# Patient Record
Sex: Female | Born: 1970
Health system: Southern US, Community
[De-identification: ages and names within clinical notes are randomized; demographics above are authoritative.]

## PROBLEM LIST (undated history)

## (undated) ENCOUNTER — Ambulatory Visit: Payer: 59 | Source: Home / Self Care

## (undated) DIAGNOSIS — Z8673 Personal history of transient ischemic attack (TIA), and cerebral infarction without residual deficits: Secondary | ICD-10-CM

## (undated) DIAGNOSIS — K219 Gastro-esophageal reflux disease without esophagitis: Secondary | ICD-10-CM

## (undated) DIAGNOSIS — I10 Essential (primary) hypertension: Secondary | ICD-10-CM

## (undated) DIAGNOSIS — J189 Pneumonia, unspecified organism: Secondary | ICD-10-CM

## (undated) DIAGNOSIS — D649 Anemia, unspecified: Secondary | ICD-10-CM

## (undated) DIAGNOSIS — M1990 Unspecified osteoarthritis, unspecified site: Secondary | ICD-10-CM

## (undated) DIAGNOSIS — R200 Anesthesia of skin: Secondary | ICD-10-CM

## (undated) DIAGNOSIS — R51 Headache: Secondary | ICD-10-CM

## (undated) DIAGNOSIS — J449 Chronic obstructive pulmonary disease, unspecified: Secondary | ICD-10-CM

## (undated) DIAGNOSIS — F419 Anxiety disorder, unspecified: Secondary | ICD-10-CM

## (undated) DIAGNOSIS — Z9289 Personal history of other medical treatment: Secondary | ICD-10-CM

## (undated) DIAGNOSIS — I639 Cerebral infarction, unspecified: Secondary | ICD-10-CM

## (undated) HISTORY — PX: TUBAL LIGATION: SHX77

## (undated) HISTORY — DX: Gastro-esophageal reflux disease without esophagitis: K21.9

## (undated) HISTORY — PX: CHOLECYSTECTOMY: SHX55

---

## 2001-05-21 ENCOUNTER — Emergency Department (HOSPITAL_COMMUNITY): Admission: EM | Admit: 2001-05-21 | Discharge: 2001-05-21 | Payer: Self-pay

## 2001-06-01 ENCOUNTER — Emergency Department (HOSPITAL_COMMUNITY): Admission: EM | Admit: 2001-06-01 | Discharge: 2001-06-01 | Payer: Self-pay | Admitting: *Deleted

## 2001-07-13 ENCOUNTER — Emergency Department (HOSPITAL_COMMUNITY): Admission: EM | Admit: 2001-07-13 | Discharge: 2001-07-13 | Payer: Self-pay | Admitting: Emergency Medicine

## 2002-02-14 ENCOUNTER — Emergency Department (HOSPITAL_COMMUNITY): Admission: EM | Admit: 2002-02-14 | Discharge: 2002-02-14 | Payer: Self-pay | Admitting: Emergency Medicine

## 2002-06-05 ENCOUNTER — Ambulatory Visit (HOSPITAL_COMMUNITY): Admission: RE | Admit: 2002-06-05 | Discharge: 2002-06-05 | Payer: Self-pay | Admitting: *Deleted

## 2002-09-14 ENCOUNTER — Ambulatory Visit (HOSPITAL_COMMUNITY): Admission: RE | Admit: 2002-09-14 | Discharge: 2002-09-14 | Payer: Self-pay | Admitting: *Deleted

## 2002-09-19 ENCOUNTER — Inpatient Hospital Stay (HOSPITAL_COMMUNITY): Admission: AD | Admit: 2002-09-19 | Discharge: 2002-09-19 | Payer: Self-pay | Admitting: *Deleted

## 2002-09-20 ENCOUNTER — Ambulatory Visit (HOSPITAL_COMMUNITY): Admission: RE | Admit: 2002-09-20 | Discharge: 2002-09-20 | Payer: Self-pay | Admitting: *Deleted

## 2002-10-13 ENCOUNTER — Inpatient Hospital Stay (HOSPITAL_COMMUNITY): Admission: AD | Admit: 2002-10-13 | Discharge: 2002-10-13 | Payer: Self-pay | Admitting: Obstetrics and Gynecology

## 2002-11-09 ENCOUNTER — Inpatient Hospital Stay (HOSPITAL_COMMUNITY): Admission: AD | Admit: 2002-11-09 | Discharge: 2002-11-12 | Payer: Self-pay | Admitting: Obstetrics and Gynecology

## 2003-01-28 ENCOUNTER — Emergency Department (HOSPITAL_COMMUNITY): Admission: EM | Admit: 2003-01-28 | Discharge: 2003-01-28 | Payer: Self-pay

## 2003-02-24 ENCOUNTER — Encounter: Payer: Self-pay | Admitting: Emergency Medicine

## 2003-02-24 ENCOUNTER — Emergency Department (HOSPITAL_COMMUNITY): Admission: EM | Admit: 2003-02-24 | Discharge: 2003-02-24 | Payer: Self-pay | Admitting: Emergency Medicine

## 2003-03-08 ENCOUNTER — Emergency Department (HOSPITAL_COMMUNITY): Admission: EM | Admit: 2003-03-08 | Discharge: 2003-03-08 | Payer: Self-pay | Admitting: Emergency Medicine

## 2003-04-04 ENCOUNTER — Encounter: Payer: Self-pay | Admitting: General Surgery

## 2003-04-04 ENCOUNTER — Observation Stay (HOSPITAL_COMMUNITY): Admission: RE | Admit: 2003-04-04 | Discharge: 2003-04-05 | Payer: Self-pay | Admitting: General Surgery

## 2003-04-04 ENCOUNTER — Encounter (INDEPENDENT_AMBULATORY_CARE_PROVIDER_SITE_OTHER): Payer: Self-pay | Admitting: Specialist

## 2004-07-02 ENCOUNTER — Emergency Department (HOSPITAL_COMMUNITY): Admission: EM | Admit: 2004-07-02 | Discharge: 2004-07-02 | Payer: Self-pay | Admitting: Emergency Medicine

## 2004-10-22 ENCOUNTER — Inpatient Hospital Stay (HOSPITAL_COMMUNITY): Admission: AD | Admit: 2004-10-22 | Discharge: 2004-10-22 | Payer: Self-pay | Admitting: Obstetrics

## 2005-03-07 ENCOUNTER — Inpatient Hospital Stay (HOSPITAL_COMMUNITY): Admission: AD | Admit: 2005-03-07 | Discharge: 2005-03-09 | Payer: Self-pay | Admitting: Obstetrics

## 2005-03-08 ENCOUNTER — Encounter (INDEPENDENT_AMBULATORY_CARE_PROVIDER_SITE_OTHER): Payer: Self-pay | Admitting: Specialist

## 2006-05-28 ENCOUNTER — Emergency Department (HOSPITAL_COMMUNITY): Admission: EM | Admit: 2006-05-28 | Discharge: 2006-05-28 | Payer: Self-pay | Admitting: Family Medicine

## 2006-10-09 ENCOUNTER — Emergency Department (HOSPITAL_COMMUNITY): Admission: EM | Admit: 2006-10-09 | Discharge: 2006-10-09 | Payer: Self-pay | Admitting: Family Medicine

## 2008-02-01 ENCOUNTER — Emergency Department (HOSPITAL_COMMUNITY): Admission: EM | Admit: 2008-02-01 | Discharge: 2008-02-01 | Payer: Self-pay | Admitting: Family Medicine

## 2008-04-05 ENCOUNTER — Emergency Department (HOSPITAL_COMMUNITY): Admission: EM | Admit: 2008-04-05 | Discharge: 2008-04-05 | Payer: Self-pay | Admitting: Emergency Medicine

## 2008-04-07 ENCOUNTER — Emergency Department (HOSPITAL_COMMUNITY): Admission: EM | Admit: 2008-04-07 | Discharge: 2008-04-08 | Payer: Self-pay | Admitting: Emergency Medicine

## 2008-07-23 ENCOUNTER — Emergency Department (HOSPITAL_COMMUNITY): Admission: EM | Admit: 2008-07-23 | Discharge: 2008-07-23 | Payer: Self-pay | Admitting: Emergency Medicine

## 2008-12-08 ENCOUNTER — Emergency Department (HOSPITAL_COMMUNITY): Admission: EM | Admit: 2008-12-08 | Discharge: 2008-12-08 | Payer: Self-pay | Admitting: Emergency Medicine

## 2009-07-14 ENCOUNTER — Emergency Department (HOSPITAL_COMMUNITY): Admission: EM | Admit: 2009-07-14 | Discharge: 2009-07-14 | Payer: Self-pay | Admitting: Chiropractic Medicine

## 2009-10-10 ENCOUNTER — Emergency Department (HOSPITAL_COMMUNITY): Admission: EM | Admit: 2009-10-10 | Discharge: 2009-10-10 | Payer: Self-pay | Admitting: Emergency Medicine

## 2009-11-14 ENCOUNTER — Emergency Department (HOSPITAL_COMMUNITY): Admission: EM | Admit: 2009-11-14 | Discharge: 2009-11-14 | Payer: Self-pay | Admitting: Family Medicine

## 2010-07-20 ENCOUNTER — Emergency Department (HOSPITAL_COMMUNITY): Admission: EM | Admit: 2010-07-20 | Discharge: 2010-07-20 | Payer: Self-pay | Admitting: Emergency Medicine

## 2010-07-29 ENCOUNTER — Other Ambulatory Visit: Admission: RE | Admit: 2010-07-29 | Discharge: 2010-07-29 | Payer: Self-pay | Admitting: Family Medicine

## 2010-10-26 ENCOUNTER — Emergency Department (HOSPITAL_COMMUNITY)
Admission: EM | Admit: 2010-10-26 | Discharge: 2010-10-26 | Payer: Self-pay | Source: Home / Self Care | Admitting: Emergency Medicine

## 2011-01-04 ENCOUNTER — Other Ambulatory Visit: Payer: Self-pay | Admitting: Neurological Surgery

## 2011-01-04 DIAGNOSIS — M542 Cervicalgia: Secondary | ICD-10-CM

## 2011-01-06 ENCOUNTER — Ambulatory Visit
Admission: RE | Admit: 2011-01-06 | Discharge: 2011-01-06 | Disposition: A | Discharge status: Home / Self Care | Payer: 59 | Source: Ambulatory Visit | Attending: Neurological Surgery | Admitting: Neurological Surgery

## 2011-01-06 DIAGNOSIS — M542 Cervicalgia: Secondary | ICD-10-CM

## 2011-01-23 LAB — WET PREP, GENITAL
WBC, Wet Prep HPF POC: NONE SEEN
Yeast Wet Prep HPF POC: NONE SEEN

## 2011-02-12 LAB — URINE CULTURE
Colony Count: NO GROWTH
Culture: NO GROWTH

## 2011-02-12 LAB — URINE MICROSCOPIC-ADD ON

## 2011-02-12 LAB — DIFFERENTIAL
Basophils Absolute: 0 10*3/uL (ref 0.0–0.1)
Basophils Relative: 1 % (ref 0–1)
Eosinophils Absolute: 0.2 10*3/uL (ref 0.0–0.7)
Eosinophils Relative: 3 % (ref 0–5)
Lymphocytes Relative: 61 % — ABNORMAL HIGH (ref 12–46)
Lymphs Abs: 3.5 10*3/uL (ref 0.7–4.0)
Monocytes Absolute: 0.4 10*3/uL (ref 0.1–1.0)
Monocytes Relative: 7 % (ref 3–12)
Neutro Abs: 1.7 10*3/uL (ref 1.7–7.7)
Neutrophils Relative %: 29 % — ABNORMAL LOW (ref 43–77)

## 2011-02-12 LAB — BASIC METABOLIC PANEL
BUN: 10 mg/dL (ref 6–23)
CO2: 25 mEq/L (ref 19–32)
Calcium: 8.9 mg/dL (ref 8.4–10.5)
Chloride: 106 mEq/L (ref 96–112)
Creatinine, Ser: 0.81 mg/dL (ref 0.4–1.2)
GFR calc Af Amer: >60 mL/min (ref >60)
GFR calc non Af Amer: >60 mL/min (ref >60)
Glucose, Bld: 104 mg/dL — ABNORMAL HIGH (ref 70–99)
Potassium: 3.2 mEq/L — ABNORMAL LOW (ref 3.5–5.1)
Sodium: 140 mEq/L (ref 135–145)

## 2011-02-12 LAB — URINALYSIS, ROUTINE W REFLEX MICROSCOPIC
Bilirubin Urine: NEGATIVE
Glucose, UA: NEGATIVE mg/dL
Ketones, ur: NEGATIVE mg/dL
Nitrite: NEGATIVE
Protein, ur: NEGATIVE mg/dL
Specific Gravity, Urine: 1.026 (ref 1.005–1.030)
Urobilinogen, UA: 0.2 mg/dL (ref 0.0–1.0)
pH: 5.5 (ref 5.0–8.0)

## 2011-02-12 LAB — CSF CELL COUNT WITH DIFFERENTIAL
Tube #: 1
Tube #: 4
WBC, CSF: 4 /mm3 (ref 0–5)

## 2011-02-12 LAB — CBC
HCT: 36.6 % (ref 36.0–46.0)
Hemoglobin: 12.1 g/dL (ref 12.0–15.0)
MCHC: 33 g/dL (ref 30.0–36.0)
MCV: 82.6 fL (ref 78.0–100.0)
Platelets: 248 10*3/uL (ref 150–400)
RBC: 4.43 MIL/uL (ref 3.87–5.11)
RDW: 16.6 % — ABNORMAL HIGH (ref 11.5–15.5)
WBC: 5.8 10*3/uL (ref 4.0–10.5)

## 2011-02-12 LAB — CSF CULTURE W GRAM STAIN: Culture: NO GROWTH

## 2011-02-12 LAB — PROTEIN AND GLUCOSE, CSF: Total  Protein, CSF: 38 mg/dL (ref 15–45)

## 2011-03-26 NOTE — Op Note (Signed)
[name], [name]                             ACCOUNT NO.:  [id number]   MEDICAL RECORD NO.:  [id number]                   PATIENT TYPE:  AMB   LOCATION:  DAY                                  FACILITY:  [facility]   PHYSICIAN:  Adolph Pollack, M.D.            DATE OF BIRTH:  September 01, 1971   DATE OF PROCEDURE:  04/04/2003  DATE OF DISCHARGE:                                 OPERATIVE REPORT   PREOPERATIVE DIAGNOSIS:  Symptomatic cholelithiasis.   POSTOPERATIVE DIAGNOSIS:  Chronic cholecystitis and cholelithiasis.   PROCEDURE:  Laparoscopic cholecystectomy with intraoperative cholangiogram.   SURGEON:  Adolph Pollack, M.D.   ASSISTANT:  Anselm Pancoast. Zachery Dakins, M.D.   ANESTHESIA:  General.   INDICATIONS FOR PROCEDURE:  [name] is a 40 year old female whose been  having attacks of biliary colic and has been seen in the emergency  department 2-3 times now. She is noted to have gallstones and had a mild  elevation in liver function tests. She now presents for elective  cholecystectomy. The procedure and risks were explained to her  preoperatively.   TECHNIQUE:  She is seen in the holding area and then brought to the  operating room, placed supine on the operating table and a general  anesthetic was administered. The abdominal wall was sterilely prepped and  draped. Local anesthetic consisting of 0.5% plain Marcaine was infiltrated  in the subumbilical region and a small subumbilical incision was made  incising the skin and subcutaneous tissue until the midline fascia was  identified. A small incision was made in the midline fascia. The peritoneal  cavity was then entered bluntly and under direct vision. A pursestring  suture of #0 Vicryl was placed around the fascial edges. A Hasson trocar was  introduced into the peritoneal cavity and a pneumoperitoneum was created by  insufflation of CO2 gas. Next, the laparoscope was introduced.   She was placed in the reverse Trendelenburg  position with the right side  tilted slightly upward. An 11 mm trocar was placed through an epigastric  incision and two 5 mm trocars placed in the right mid abdomen. The fundus of  the gallbladder was grasped and some chronic inflammatory changes were noted  grossly. It was retracted toward the right shoulder. The infundibulum was  then grasped and using careful blunt dissection and select cautery, I  mobilized the infundibulum and isolated the cystic duct at its junction with  the gallbladder. I then created a window around the cystic duct. A clip was  placed just above the cystic duct gallbladder junction and a small incision  made in the cystic duct. A cholangiocatheter was passed through the anterior  abdominal wall and placed in the cystic duct and a cholangiogram was  performed.   Under real-time fluoroscopy, dilute contrast material was injected into the  cystic duct. It was a long cystic duct. The common hepatic, right and left  hepatic, and common bile ducts all filled promptly. The common bile duct  drained contrast promptly into the duodenum. No obvious evidence of  obstruction was seen.  The final report is pending the radiologist  interpretation.   The cholangiocatheter was then removed, cystic duct was clipped three times  proximally and divided. Both an anterior and posterior branch of the cystic  artery were identified. Windows were created around him and they were  clipped and divided. The gallbladder was then dissected free from the liver  bed intact using electrocautery. Once the gallbladder was freed from the  liver, the gallbladder fossa was inspected and irrigated. Bleeding points  were controlled with the cautery. At this point, no further bleeding was  noted. No bile leak was noted. I subsequently removed the gallbladder  through the subumbilical port and then closed the fascial defect under  laparoscopic vision by tightening up and tying down the pursestring  suture.  I did notice some omental adhesions to the lower midline, these were not  taken down. These were likely secondary to a previous cesarean section.   The irrigation was evacuated. The trocars were then removed and the  pneumoperitoneum was released. The skin incisions were closed with 4-0  Monocryl subcuticular stitches. Steri-Strips and sterile dressings were  applied.   She tolerated the procedure well without any apparent complications and was  taken to the recovery room in satisfactory condition.                                               Adolph Pollack, M.D.    Kari Baars  D:  04/04/2003  T:  04/04/2003  Job:  [id number]

## 2011-03-26 NOTE — Op Note (Signed)
NAMETHEDA, PAYER                 ACCOUNT NO.:  [id number]   MEDICAL RECORD NO.:  [id number]          PATIENT TYPE:  INP   LOCATION:  9127                          FACILITY:  [facility]   PHYSICIAN:  Charles A. Clearance Coots, M.D.DATE OF BIRTH:  [birth year]   DATE OF PROCEDURE:  03/08/2005  DATE OF DISCHARGE:                                 OPERATIVE REPORT   PREOPERATIVE DIAGNOSIS:  Desires sterilization.   POSTOPERATIVE DIAGNOSIS:  Desires sterilization.   PROCEDURE:  Bilateral partial salpingectomy.   SURGEON:  Charles A. Clearance Coots, M.D.   ANESTHESIA:  Epidural.   COMPLICATIONS:  None.   ESTIMATED BLOOD LOSS:  Negligible.   SPECIMEN:  Approximately 2 cm segments of right and left fallopian tube.   OPERATION:  The patient was brought to the operating room and after  satisfactory redosing of the epidural, the abdomen was prepped and draped in  usual sterile fashion. A small inferior umbilical incision was made with the  scalpel. It was deepened down to the fascia with curved Mayo scissors. The  fascia was grasped in the midline with two Kocher forceps and was cut in  between with curved Mayo scissors. The incision was extended to the left and  to the right with curved Mayo scissors. The peritoneum was then grasped with  hemostats and was incised with Metzenbaum scissors. Right angle retractors  were placed in the incision. The right fallopian tube was grasped in the  isthmic area of the tube with the Babcock clamp and was followed from the  corneal and to the fimbrial end and then grasped with Babcock clamps and  then re-grasped in the isthmic area with a Babcock clamp. A knuckle of tube  beneath the Babcock clamp was then doubly ligated with #1 plain catgut and  the section of tube above the knot was excised and submitted to pathology  for evaluation. There was no active bleeding from the tubal stumps and they  were placed back in the normal anatomic position.  The same procedure was  performed on the opposite side without complications. The abdomen was then  closed as follows. The fascia and peritoneum were closed as one with a  continuous suture of 2-0 Vicryl. The subcutaneous tissue was closed with a  continuous suture of 2-0 Vicryl. Skin was then closed with continuous  subcuticular suture 3-0 Monocryl. Sterile bandages applied to the incisional  closure. The patient tolerated the procedure well. Surgical technician  indicated that all needle, sponge and instrument counts were correct. The  patient was then taken to the recovery room in satisfactory condition.    CAH/MEDQ  D:  03/08/2005  T:  03/08/2005  Job:  [id number]

## 2011-03-26 NOTE — H&P (Signed)
[name], [name]                 ACCOUNT NO.:  [id number]   MEDICAL RECORD NO.:  [id number]          PATIENT TYPE:  INP   LOCATION:  9168                          FACILITY:  [facility]   PHYSICIAN:  Roseanna Rainbow, M.D.DATE OF BIRTH:  [birth year]   DATE OF ADMISSION:  03/07/2005  DATE OF DISCHARGE:                                HISTORY & PHYSICAL   CHIEF COMPLAINT:  The patient is a 40 year old para 6 with an estimated date  of confinement of Mar 28, 2005 with an intrauterine pregnancy at 37 weeks  complaining of ruptured membrane.   HISTORY OF PRESENT ILLNESS:  The patient reported a gush of fluid several  hours prior to presentation. The fluid initially is characterized as clear.  However, upon arrival to [facility] as per the R.N., there was  meconium staining noted. Antepartum course, problems, risks: grand  multiparity, history of a previous cesarean delivery, advanced maternal age,  history of a preterm delivery, tobacco use.   PRENATAL SCREENS:  Hemoglobin 11.8, platelets 274,000. Blood type B  positive, Rh antibody negative, sickle cell trait negative, RPR nonreactive.  Rubella immuned. Hepatitis B surface antigen negative. HIV nonreactive.  Urine culture and sensitivity no growth. Pap smear within normal limits.  Gonorrhea and Chlamydia negative. GBS negative. First trimester Down  syndrome screening within normal limits.   Ultrasound on January 11, 2005 at 29 weeks 2 days estimated fetal weight growth  percentile 56th percentile, placenta anterior normal, amniotic fluid index  normal.   OBSTETRICAL HISTORY:  October 1986, she delivered of a female 6 pounds, full-  term, spontaneous vaginal delivery, no complications. In October of 1986,  she delivered of a female 6 pounds, full-term spontaneous vaginal delivery, no  complications. In January of 1988, she delivered a female 5 pounds at 35  weeks. In August of 1989, she delivered of a female 7 pounds, full-term  spontaneous vaginal delivery, no complications. In March of 1991, she  delivered a female 6 pounds by cesarean delivery secondary to fetal distress.  The cesarean delivery in 1991 was complicated by increase blood loss  requiring a blood transfusion. In January of 2004, she delivered of a female 6  pounds 12 ounces, full-term spontaneous vaginal delivery, no complications.  She has a history of gonorrhea. She has a history of abnormal Pap smear.   PAST MEDICAL HISTORY:  She has a history of a positive PPD and a negative  chest x-ray.   PAST SURGICAL HISTORY:  She is status post cholecystectomy.   FAMILY HISTORY:  Noncontributory.   SOCIAL HISTORY:  She is single. She reports five to six cigarettes daily  tobacco use. She denies any ethanol or substance abuse.   MEDICATIONS:  Flagyl and prenatal vitamins.   ALLERGIES:  No known drug allergies.   PHYSICAL EXAMINATION:  VITAL SIGNS:  Stable and afebrile. Fetal heart  tracing reassuring. Tocodynamometer with irregular uterine contractions.  PELVIC:  Serial vaginal exam as per the R.N., 5 cm dilated, 60% effaced.   ASSESSMENT:  Grand multiparity at 37 weeks with spontaneous rupture of  membranes, meconium stained.  History of a previous cesarean section delivery  and vaginal birth after cesarean section now for a repeat vaginal birth  after cesarean section. Group B strep negative. The fetal heart tracing is  consistent with fetal well being. The patient desires sterilization after  delivery.   PLAN:  Admission, possible augmentation of labor with Pitocin. Monitor  carefully. Likely postpartum bilateral tubal ligation.      LAJ/MEDQ  D:  03/07/2005  T:  03/07/2005  Job:  [id number]

## 2011-07-11 ENCOUNTER — Inpatient Hospital Stay (INDEPENDENT_AMBULATORY_CARE_PROVIDER_SITE_OTHER)
Admission: RE | Admit: 2011-07-11 | Discharge: 2011-07-11 | Disposition: A | Discharge status: Home / Self Care | Payer: 59 | Source: Ambulatory Visit | Attending: Family Medicine | Admitting: Family Medicine

## 2011-07-11 DIAGNOSIS — R071 Chest pain on breathing: Secondary | ICD-10-CM

## 2011-08-02 LAB — POCT RAPID STREP A: Streptococcus, Group A Screen (Direct): NEGATIVE

## 2011-09-02 ENCOUNTER — Emergency Department (HOSPITAL_COMMUNITY)
Admission: EM | Admit: 2011-09-02 | Discharge: 2011-09-02 | Disposition: A | Discharge status: Home / Self Care | Payer: Self-pay | Attending: Emergency Medicine | Admitting: Emergency Medicine

## 2011-09-02 DIAGNOSIS — R1110 Vomiting, unspecified: Secondary | ICD-10-CM | POA: Insufficient documentation

## 2011-09-02 DIAGNOSIS — J3489 Other specified disorders of nose and nasal sinuses: Secondary | ICD-10-CM | POA: Insufficient documentation

## 2011-09-02 DIAGNOSIS — IMO0001 Reserved for inherently not codable concepts without codable children: Secondary | ICD-10-CM | POA: Insufficient documentation

## 2011-09-02 DIAGNOSIS — R059 Cough, unspecified: Secondary | ICD-10-CM | POA: Insufficient documentation

## 2011-09-02 DIAGNOSIS — R05 Cough: Secondary | ICD-10-CM | POA: Insufficient documentation

## 2011-09-02 DIAGNOSIS — B9789 Other viral agents as the cause of diseases classified elsewhere: Secondary | ICD-10-CM | POA: Insufficient documentation

## 2011-09-07 ENCOUNTER — Ambulatory Visit (INDEPENDENT_AMBULATORY_CARE_PROVIDER_SITE_OTHER): Payer: Self-pay

## 2011-09-07 ENCOUNTER — Inpatient Hospital Stay (INDEPENDENT_AMBULATORY_CARE_PROVIDER_SITE_OTHER)
Admission: RE | Admit: 2011-09-07 | Discharge: 2011-09-07 | Disposition: A | Discharge status: Home / Self Care | Payer: Self-pay | Source: Ambulatory Visit | Attending: Family Medicine | Admitting: Family Medicine

## 2011-09-07 DIAGNOSIS — J40 Bronchitis, not specified as acute or chronic: Secondary | ICD-10-CM

## 2011-10-09 ENCOUNTER — Emergency Department (HOSPITAL_COMMUNITY)
Admission: EM | Admit: 2011-10-09 | Discharge: 2011-10-09 | Disposition: A | Discharge status: Home / Self Care | Payer: Self-pay

## 2011-10-09 ENCOUNTER — Encounter: Payer: Self-pay | Admitting: Emergency Medicine

## 2011-10-09 NOTE — ED Notes (Signed)
Pt reports left arm and neck pain onset this morning. Pt reports had cortisone shots in June for same pain.

## 2013-05-08 DIAGNOSIS — I639 Cerebral infarction, unspecified: Secondary | ICD-10-CM

## 2013-05-08 HISTORY — DX: Cerebral infarction, unspecified: I63.9

## 2013-05-18 ENCOUNTER — Emergency Department (HOSPITAL_COMMUNITY): Payer: Medicaid Other

## 2013-05-18 ENCOUNTER — Encounter (HOSPITAL_COMMUNITY): Payer: Self-pay | Admitting: Emergency Medicine

## 2013-05-18 ENCOUNTER — Inpatient Hospital Stay (HOSPITAL_COMMUNITY): Payer: Medicaid Other

## 2013-05-18 ENCOUNTER — Inpatient Hospital Stay (HOSPITAL_COMMUNITY)
Admission: EM | Admit: 2013-05-18 | Discharge: 2013-05-21 | DRG: 066 | Disposition: A | Discharge status: Home / Self Care | Payer: Medicaid Other | Attending: Internal Medicine | Admitting: Internal Medicine

## 2013-05-18 DIAGNOSIS — D649 Anemia, unspecified: Secondary | ICD-10-CM | POA: Diagnosis present

## 2013-05-18 DIAGNOSIS — I639 Cerebral infarction, unspecified: Secondary | ICD-10-CM

## 2013-05-18 DIAGNOSIS — R7309 Other abnormal glucose: Secondary | ICD-10-CM

## 2013-05-18 DIAGNOSIS — Z8673 Personal history of transient ischemic attack (TIA), and cerebral infarction without residual deficits: Secondary | ICD-10-CM

## 2013-05-18 DIAGNOSIS — E876 Hypokalemia: Secondary | ICD-10-CM

## 2013-05-18 DIAGNOSIS — I6340 Cerebral infarction due to embolism of unspecified cerebral artery: Principal | ICD-10-CM | POA: Diagnosis present

## 2013-05-18 DIAGNOSIS — E669 Obesity, unspecified: Secondary | ICD-10-CM | POA: Diagnosis present

## 2013-05-18 DIAGNOSIS — Z7982 Long term (current) use of aspirin: Secondary | ICD-10-CM

## 2013-05-18 DIAGNOSIS — Z6834 Body mass index (BMI) 34.0-34.9, adult: Secondary | ICD-10-CM

## 2013-05-18 DIAGNOSIS — G459 Transient cerebral ischemic attack, unspecified: Secondary | ICD-10-CM

## 2013-05-18 DIAGNOSIS — E785 Hyperlipidemia, unspecified: Secondary | ICD-10-CM | POA: Diagnosis present

## 2013-05-18 DIAGNOSIS — G43909 Migraine, unspecified, not intractable, without status migrainosus: Secondary | ICD-10-CM | POA: Diagnosis present

## 2013-05-18 DIAGNOSIS — I10 Essential (primary) hypertension: Secondary | ICD-10-CM | POA: Diagnosis present

## 2013-05-18 DIAGNOSIS — R112 Nausea with vomiting, unspecified: Secondary | ICD-10-CM

## 2013-05-18 DIAGNOSIS — F17200 Nicotine dependence, unspecified, uncomplicated: Secondary | ICD-10-CM

## 2013-05-18 DIAGNOSIS — F17201 Nicotine dependence, unspecified, in remission: Secondary | ICD-10-CM

## 2013-05-18 DIAGNOSIS — IMO0001 Reserved for inherently not codable concepts without codable children: Secondary | ICD-10-CM

## 2013-05-18 HISTORY — DX: Cerebral infarction, unspecified: I63.9

## 2013-05-18 LAB — APTT: aPTT: 26 seconds (ref 24–37)

## 2013-05-18 LAB — CBC
HCT: 29.7 % — ABNORMAL LOW (ref 36.0–46.0)
Hemoglobin: 9.7 g/dL — ABNORMAL LOW (ref 12.0–15.0)
Hemoglobin: 9.3 g/dL — ABNORMAL LOW (ref 12.0–15.0)
MCH: 21.2 pg — ABNORMAL LOW (ref 26.0–34.0)
MCHC: 31.3 g/dL (ref 30.0–36.0)
Platelets: 273 10*3/uL (ref 150–400)
RBC: 4.57 MIL/uL (ref 3.87–5.11)
RBC: 4.37 MIL/uL (ref 3.87–5.11)
WBC: 7.2 10*3/uL (ref 4.0–10.5)

## 2013-05-18 LAB — POCT I-STAT, CHEM 8
Calcium, Ion: 1.12 mmol/L (ref 1.12–1.23)
Chloride: 106 mEq/L (ref 96–112)
Creatinine, Ser: 1 mg/dL (ref 0.50–1.10)
Glucose, Bld: 143 mg/dL — ABNORMAL HIGH (ref 70–99)
Potassium: 3 mEq/L — ABNORMAL LOW (ref 3.5–5.1)

## 2013-05-18 LAB — DIFFERENTIAL
Basophils Relative: 0 % (ref 0–1)
Eosinophils Absolute: 0.1 10*3/uL (ref 0.0–0.7)
Eosinophils Relative: 1 % (ref 0–5)
Lymphocytes Relative: 47 % — ABNORMAL HIGH (ref 12–46)
Monocytes Absolute: 0.5 10*3/uL (ref 0.1–1.0)
Neutrophils Relative %: 45 % (ref 43–77)

## 2013-05-18 LAB — COMPREHENSIVE METABOLIC PANEL
ALT: 7 U/L (ref 0–35)
AST: 13 U/L (ref 0–37)
Alkaline Phosphatase: 61 U/L (ref 39–117)
CO2: 25 mEq/L (ref 19–32)
Calcium: 8.8 mg/dL (ref 8.4–10.5)
Glucose, Bld: 149 mg/dL — ABNORMAL HIGH (ref 70–99)
Potassium: 2.7 mEq/L — CL (ref 3.5–5.1)
Sodium: 140 mEq/L (ref 135–145)
Total Protein: 7.5 g/dL (ref 6.0–8.3)

## 2013-05-18 LAB — GLUCOSE, CAPILLARY: Glucose-Capillary: 128 mg/dL — ABNORMAL HIGH (ref 70–99)

## 2013-05-18 LAB — RAPID URINE DRUG SCREEN, HOSP PERFORMED
Barbiturates: NOT DETECTED
Tetrahydrocannabinol: NOT DETECTED

## 2013-05-18 LAB — HEMOGLOBIN A1C: Hgb A1c MFr Bld: 5.8 % — ABNORMAL HIGH (ref <5.7)

## 2013-05-18 LAB — PROTIME-INR
INR: 1 (ref 0.00–1.49)
Prothrombin Time: 13 seconds (ref 11.6–15.2)

## 2013-05-18 LAB — CREATININE, SERUM
Creatinine, Ser: 0.93 mg/dL (ref 0.50–1.10)
GFR calc Af Amer: 86 mL/min — ABNORMAL LOW (ref >90)
GFR calc non Af Amer: 74 mL/min — ABNORMAL LOW (ref >90)

## 2013-05-18 MED ORDER — ASPIRIN 81 MG PO CHEW
324.0000 mg | CHEWABLE_TABLET | Freq: Once | ORAL | Status: AC
  Administered 2013-05-18: 324 mg via ORAL
  Filled 2013-05-18: qty 4

## 2013-05-18 MED ORDER — POTASSIUM CHLORIDE CRYS ER 20 MEQ PO TBCR: 40.0000 meq | EXTENDED_RELEASE_TABLET | Freq: Once | ORAL | Status: DC

## 2013-05-18 MED ORDER — NICOTINE 21 MG/24HR TD PT24
21.0000 mg | MEDICATED_PATCH | Freq: Every day | TRANSDERMAL | Status: DC
  Administered 2013-05-19 – 2013-05-21 (×3): 21 mg via TRANSDERMAL
  Filled 2013-05-18 (×4): qty 1

## 2013-05-18 MED ORDER — LORAZEPAM 1 MG PO TABS
1.0000 mg | ORAL_TABLET | Freq: Once | ORAL | Status: AC
  Administered 2013-05-18: 1 mg via ORAL
  Filled 2013-05-18: qty 1

## 2013-05-18 MED ORDER — ASPIRIN 325 MG PO TABS
325.0000 mg | ORAL_TABLET | Freq: Every day | ORAL | Status: DC
  Administered 2013-05-18 – 2013-05-21 (×4): 325 mg via ORAL
  Filled 2013-05-18 (×4): qty 1

## 2013-05-18 MED ORDER — NICOTINE 21 MG/24HR TD PT24
21.0000 mg | MEDICATED_PATCH | Freq: Once | TRANSDERMAL | Status: AC
  Administered 2013-05-18: 21 mg via TRANSDERMAL
  Filled 2013-05-18: qty 1

## 2013-05-18 MED ORDER — POTASSIUM CHLORIDE CRYS ER 20 MEQ PO TBCR
40.0000 meq | EXTENDED_RELEASE_TABLET | Freq: Once | ORAL | Status: AC
  Administered 2013-05-18: 40 meq via ORAL
  Filled 2013-05-18: qty 2

## 2013-05-18 MED ORDER — VARENICLINE TARTRATE 0.5 MG PO TABS
0.5000 mg | ORAL_TABLET | Freq: Every day | ORAL | Status: DC
  Administered 2013-05-18 – 2013-05-21 (×4): 0.5 mg via ORAL
  Filled 2013-05-18 (×6): qty 1

## 2013-05-18 MED ORDER — HEPARIN SODIUM (PORCINE) 5000 UNIT/ML IJ SOLN
5000.0000 [IU] | Freq: Three times a day (TID) | INTRAMUSCULAR | Status: DC
  Administered 2013-05-18 – 2013-05-21 (×9): 5000 [IU] via SUBCUTANEOUS
  Filled 2013-05-18 (×16): qty 1

## 2013-05-18 MED ORDER — NICOTINE 21 MG/24HR TD PT24: 21.0000 mg | MEDICATED_PATCH | Freq: Every day | TRANSDERMAL | Status: DC

## 2013-05-18 NOTE — Progress Notes (Signed)
Oncoming nurse aware of telemetry order for patient. During previous shift change report at 1500, this nurse not made aware that patient had telemetry order. To place patient on monitor. Sherlyn Lees, RN

## 2013-05-18 NOTE — Progress Notes (Signed)
*  PRELIMINARY RESULTS* Echocardiogram 2D Echocardiogram has been performed.  [name] 05/18/2013, 4:23 PM

## 2013-05-18 NOTE — Care Management Note (Unsigned)
    Page 1 of 2   05/21/2013     1:49:05 PM   CARE MANAGEMENT NOTE 05/21/2013  Patient:  [name], [name]   Account Number:  [id number]  Date Initiated:  05/18/2013  Documentation initiated by:  Jiles Crocker  Subjective/Objective Assessment:   ADMITTED WITH TIA     Action/Plan:   INDEPENDENT PRIOR TO ADMISSION; WORKS AS A NURSING ASSISTANT AT [employer]; CM FOLLOWING FOR DCP   Anticipated DC Date:  05/22/2013   Anticipated DC Plan:  HOME/SELF CARE  In-house referral  Financial Counselor      DC Planning Services  CM consult      Choice offered to / List presented to:             Status of service:  Completed, signed off Medicare Important Message given?  NA - LOS <3 / Initial given by admissions (If response is "NO", the following Medicare IM given date fields will be blank) Date Medicare IM given:   Date Additional Medicare IM given:    Discharge Disposition:    Per UR Regulation:  Reviewed for med. necessity/level of care/duration of stay  If discussed at Long Length of Stay Meetings, dates discussed:    Comments:  05/21/13 1345  Elmer Bales RN, MSN, CM- Spoke with [facility] to change patient's appointment as she will not be discharged for 1-2 days.  Pt was given written information regarding her appointment, which is now set for7/25/14 at 1100.  Information was updated in AVS   05/18/13 1545 Courtney Robarge RN, MSN, CM- Met with patient to discuss establising a PCP.  Pt was agreeable to being set up with the [facility] for follow-up.  Appointment was made for Friday, July 18th at 12:00.  Pt was given written information regarding this appointment and it was also entered into the AVS. Financial counselor was notified of patient's self-pay status.    05/18/2013- B CHANDLER RN,BSN,MHA

## 2013-05-18 NOTE — ED Provider Notes (Signed)
History    CSN: [id number] Arrival date & time 05/18/13  0443  First MD Initiated Contact with Patient 05/18/13 0455     Chief Complaint  Patient presents with  . Extremity Weakness   (Consider location/radiation/quality/duration/timing/severity/associated sxs/prior Treatment) HPI HX per PT  L arm numbness onset around 4pm yesterday while at work - this resolved.  She was awake, at home tonight and about 15 min PTA developed sudden onset L face droop, L arm and leg numbness and L arm weakness. No HA. No trouble with vision. No h/o same. Symptoms mod in severity. She is smoker denies any h/o TIA/ CVA/ HTN  Code stroke called at time of evaluation  History reviewed. No pertinent past medical history. Past Surgical History  Procedure Laterality Date  . Cesarean section    . Cholecystectomy     No family history on file. History  Substance Use Topics  . Smoking status: Current Every Day Smoker  . Smokeless tobacco: Never Used  . Alcohol Use: Yes     Comment: occasional   OB History   Grav Para Term Preterm Abortions TAB SAB Ect Mult Living                 Review of Systems  Constitutional: Negative for fever and chills.  HENT: Negative for neck pain and neck stiffness.   Eyes: Negative for pain.  Respiratory: Negative for shortness of breath.   Cardiovascular: Negative for chest pain.  Gastrointestinal: Negative for abdominal pain.  Genitourinary: Negative for dysuria.  Musculoskeletal: Negative for back pain.  Skin: Negative for rash.  Neurological: Positive for weakness and numbness. Negative for headaches.  All other systems reviewed and are negative.    Allergies  Review of patient's allergies indicates no known allergies.  Home Medications  No current outpatient prescriptions on file. BP 144/61  Pulse 92  Temp(Src) 98 F (36.7 C) (Oral)  Resp 16  SpO2 99%  LMP 04/17/2013 Physical Exam  Constitutional: She is oriented to person, place, and time. She  appears well-developed and well-nourished.  HENT:  Head: Normocephalic and atraumatic.  Eyes: EOM are normal. Pupils are equal, round, and reactive to light.  Neck: Neck supple.  Cardiovascular: Normal rate, regular rhythm and intact distal pulses.   Pulmonary/Chest: Effort normal and breath sounds normal. No respiratory distress.  Abdominal: Soft. She exhibits no distension. There is no tenderness.  Musculoskeletal: Normal range of motion. She exhibits no edema.  Neurological: She is alert and oriented to person, place, and time.  L facial droop. L grip/ bicep / tricep weak versus R.   Skin: Skin is warm and dry.    ED Course  Procedures (including critical care time)  Results for orders placed during the hospital encounter of 05/18/13  PROTIME-INR      Result Value Range   Prothrombin Time 13.0  11.6 - 15.2 seconds   INR 1.00  0.00 - 1.49  APTT      Result Value Range   aPTT 26  24 - 37 seconds  CBC      Result Value Range   WBC 7.7  4.0 - 10.5 K/uL   RBC 4.57  3.87 - 5.11 MIL/uL   Hemoglobin 9.7 (*) 12.0 - 15.0 g/dL   HCT 46.9 (*) 62.9 - 52.8 %   MCV 68.1 (*) 78.0 - 100.0 fL   MCH 21.2 (*) 26.0 - 34.0 pg   MCHC 31.2  30.0 - 36.0 g/dL   RDW 41.3 (*)  11.5 - 15.5 %   Platelets 273  150 - 400 K/uL  DIFFERENTIAL      Result Value Range   Neutrophils Relative % PENDING  43 - 77 %   Neutro Abs PENDING  1.7 - 7.7 K/uL   Band Neutrophils PENDING  0 - 10 %   Lymphocytes Relative PENDING  12 - 46 %   Lymphs Abs PENDING  0.7 - 4.0 K/uL   Monocytes Relative PENDING  3 - 12 %   Monocytes Absolute PENDING  0.1 - 1.0 K/uL   Eosinophils Relative PENDING  0 - 5 %   Eosinophils Absolute PENDING  0.0 - 0.7 K/uL   Basophils Relative PENDING  0 - 1 %   Basophils Absolute PENDING  0.0 - 0.1 K/uL   WBC Morphology PENDING     RBC Morphology PENDING     Smear Review PENDING     nRBC PENDING  0 /100 WBC   Metamyelocytes Relative PENDING     Myelocytes PENDING     Promyelocytes Absolute  PENDING     Blasts PENDING    TROPONIN I      Result Value Range   Troponin I <0.30  <0.30 ng/mL  GLUCOSE, CAPILLARY      Result Value Range   Glucose-Capillary 128 (*) 70 - 99 mg/dL   Comment 1 Notify RN    POCT I-STAT, CHEM 8      Result Value Range   Sodium 143  135 - 145 mEq/L   Potassium 3.0 (*) 3.5 - 5.1 mEq/L   Chloride 106  96 - 112 mEq/L   BUN 12  6 - 23 mg/dL   Creatinine, Ser 1.61  0.50 - 1.10 mg/dL   Glucose, Bld 096 (*) 70 - 99 mg/dL   Calcium, Ion 0.45  4.09 - 1.23 mmol/L   TCO2 25  0 - 100 mmol/L   Hemoglobin 11.6 (*) 12.0 - 15.0 g/dL   HCT 81.1 (*) 91.4 - 78.2 %  POCT I-STAT TROPONIN I      Result Value Range   Troponin i, poc 0.03  0.00 - 0.08 ng/mL   Comment 3            Ct Head (brain) Wo Contrast  05/18/2013   *RADIOLOGY REPORT*  Clinical Data: Left arm weakness onset of 04:00 p.m. yesterday afternoon.  CT HEAD WITHOUT CONTRAST  Technique:  Contiguous axial images were obtained from the base of the skull through the vertex without contrast.  Comparison: 10/10/2009  Findings: Cavum septum pellucidum.  Suggestion of vague low attenuation change in the right temporal lobe which could represent early changes of acute infarct.  No mass effect or midline shift. No abnormal extra-axial fluid collections.  Gray-white matter junctions are distinct.  Basal cisterns are not effaced.  No sulci effacement.  No acute intracranial hemorrhage.  No depressed skull fractures.  Visualized portions of the paranasal sinuses and mastoid air cells are not opacified.  IMPRESSION: Nonspecific low attenuation in the right temporal lobe could represent early changes of acute infarct.  No evidence of acute intracranial hemorrhage.  Code stroke results were telephoned to Dr. Dierdre Highman at [facility] hours on 05/18/2013.   Original Report Authenticated By: Burman Nieves, M.D.       Date: 05/18/2013  Rate: 89  Rhythm: normal sinus rhythm  QRS Axis: normal  Intervals: normal  ST/T Wave abnormalities:  nonspecific ST changes  Conduction Disutrbances:none  Narrative Interpretation:   Old EKG Reviewed: unchanged  5:29 AM symptoms much improved, NIH stroke scale now 0. NEU, DR Roseanne Reno recs admit for TIA work up.   5:42 AM DR Julian Reil to admit. ASA and potassium provided  MDM  Code stroke  Symptoms resolving in ED - not a tPA candidate  CT, ECG, labs  ASA  MED admit TIA w/ up    Sunnie Nielsen, MD 05/18/13 [phone]

## 2013-05-18 NOTE — H&P (Signed)
[facility] History and Physical  [name] JYN:[id number] DOB: 12-15-1970 DOA: 05/18/2013  Referring physician:  PCP: No primary provider on file. No physician Specialists: Neurology  Chief Complaint: Left-sided numbness and weakness  HPI: [name] is a 42 y.o. female AA, [id number], who had an episode at work wherein she has some numbness to the L arm yesterday at about 16:00.  This lasted for about 30 min and this finally went away.  She usually works 2nd shift 3-11pm.  She is CNA at [employer], and was at home watchig at about 04:00 am .She thinks that about 04:15 she got up to get her cell phone and felt numbness in her L arm as well as her L face and "tried to smile" and only one side of her mouth was smiling [r side] and presented here to the ED.  This has never hapeened before. Was fullay alert and oriented at this time, no Sz h/o, no real neuro h/o however she states about in 2012 she had a cervical "pinched nerve"  Review of Systems: was having a couple of migraines earlier this week and it went away with Ibuprofen-gets headaches typicall with a nagging headache.  NO CP, no Palpitations, No fever, no N/v, no dysuria, has reg periods.  History reviewed. No pertinent past medical history. Past Surgical History  Procedure Laterality Date  . Cesarean section    . Cholecystectomy     Social History:  reports that she has been smoking.  She has never used smokeless tobacco. She reports that  drinks alcohol. She reports that she does not use illicit drugs.  No Known Allergies  Family History  Problem Relation Age of Onset  . Renal Disease Father     dialysis  . Hypertension Father   . Heart disease Mother     stabbed in heart     Prior to Admission medications   Not on File   Physical Exam: Filed Vitals:   05/18/13 0603 05/18/13 0615 05/18/13 0630 05/18/13 0645  BP: 116/78 129/74 130/77   Pulse: 76 69 74   Temp: 98 F (36.7 C)     TempSrc: Oral      Resp:  26 20   Height:    5\' 2"  (1.575 m)  Weight:    86.183 kg (190 lb)  SpO2: 100% 100% 99%      General:  Alert pleasant oriented American female looking about stated age  Eyes: No icterus no pallor, vision by direct confrontation is normal  ENT: Smile symmetrical. Uvula midline.  Neck: Sternocleidomastoid and strap muscles as well as deltoids trapezius muscles all are intact and symmetrical in bulk tone and power  Cardiovascular: S1-S2 no murmur rub or gallop  Respiratory: Clinically clear  Abdomen: Soft nontender nondistended  Skin: No rash no lower extremity edema  Musculoskeletal: Range of motion is intact  Psychiatric: Euthymic  Neurologic: Grossly intact. Power 5/5. Reflexes are slightly brisk 2 to-3/3. Sensation is intact to finger-nose-finger test is normal. No dysdiadochokinesia, heel shin test bilaterally normal. No cerebellar signs. Gait not assessed. Power in lower strength is bilaterally symmetrical and ankle dorsi and plantar flexors hip flexors extensors and knee.  Labs on Admission:  Basic Metabolic Panel:  Recent Labs Lab 05/18/13 0456 05/18/13 0512  NA 140 143  K 2.7* 3.0*  CL 104 106  CO2 25  --   GLUCOSE 149* 143*  BUN 12 12  CREATININE 0.96 1.00  CALCIUM 8.8  --  Liver Function Tests:  Recent Labs Lab 05/18/13 0456  AST 13  ALT 7  ALKPHOS 61  BILITOT 0.2*  PROT 7.5  ALBUMIN 3.6   No results found for this basename: LIPASE, AMYLASE,  in the last 168 hours No results found for this basename: AMMONIA,  in the last 168 hours CBC:  Recent Labs Lab 05/18/13 0456 05/18/13 0512  WBC 7.7  --   NEUTROABS 3.5  --   HGB 9.7* 11.6*  HCT 31.1* 34.0*  MCV 68.1*  --   PLT 273  --    Cardiac Enzymes:  Recent Labs Lab 05/18/13 0457  TROPONINI <0.30    BNP (last 3 results) No results found for this basename: PROBNP,  in the last 8760 hours CBG:  Recent Labs Lab 05/18/13 0520  GLUCAP 128*    Radiological Exams on  Admission: Ct Head (brain) Wo Contrast  05/18/2013   *RADIOLOGY REPORT*  Clinical Data: Left arm weakness onset of 04:00 p.m. yesterday afternoon.  CT HEAD WITHOUT CONTRAST  Technique:  Contiguous axial images were obtained from the base of the skull through the vertex without contrast.  Comparison: 10/10/2009  Findings: Cavum septum pellucidum.  Suggestion of vague low attenuation change in the right temporal lobe which could represent early changes of acute infarct.  No mass effect or midline shift. No abnormal extra-axial fluid collections.  Gray-white matter junctions are distinct.  Basal cisterns are not effaced.  No sulci effacement.  No acute intracranial hemorrhage.  No depressed skull fractures.  Visualized portions of the paranasal sinuses and mastoid air cells are not opacified.  IMPRESSION: Nonspecific low attenuation in the right temporal lobe could represent early changes of acute infarct.  No evidence of acute intracranial hemorrhage.  Code stroke results were telephoned to Dr. Dierdre Rowland at [facility] hours on 05/18/2013.   Original Report Authenticated By: [name], M.D.    EKG: Independently reviewed. NSR, Rate 90, PR 0.04. QRS axis wnl  Assessment/Plan Principal Problem:   CVA (cerebral infarction) Active Problems:   Hypokalemia   Impaired glucose metabolism   Elevated blood pressure   1. Likely CVA-potentially right middle cerebral artery distribution. Complete workup as delineated by Neurology. Risk factors are to tobacco abuse-she also has probably undiagnosed hypertension hyperlipidemia and we will get studies to confirm or refute the diagnosis. 2. Tobacco use interested in quitting. Will offer a tobacco patch/Chantix. 3. Hypokalemia-replace orally   Neurology seen patient if consultant consulted, please document name and whether formally or informally consulted  Full code Discussed with sister and bedroom 1-2 days pending workup  Time spent: 36  [name]  [facility] Pager [phone] and  If 7PM-7AM, please contact night-coverage [email or web address] Password [facility] 05/18/2013, 7:27 AM

## 2013-05-18 NOTE — ED Notes (Signed)
Pt. Able to grasp pills with left hand and place them in her mouth. She states she also has a hx. Of having to take cortisone shots in her left neck/shoulder d/t pain in the past.

## 2013-05-18 NOTE — Progress Notes (Signed)
UR COMPLETED  

## 2013-05-18 NOTE — Progress Notes (Signed)
Bilateral carotid artery duplex:  Less than 39% ICA stenosis.  Vertebral artery flow is antegrade.     

## 2013-05-18 NOTE — ED Notes (Signed)
PT. REPORTS LEFT ARM WEAKNESS ONSET 4 PM YESTERDAY AFTERNOON , PT. PRESENTS WITH LEFT GRIP WEAKER THAT RIGHT , MILD LEFT FACIAL ASYMMETRY , SPEECH CLEAR , NO ARM DRIFT /AMBULATORY.

## 2013-05-18 NOTE — Significant Event (Signed)
Rapid Response Event Note  Overview: Time Called: 0505 Arrival Time: 0510 Event Type: Neurologic  Initial Focused Assessment: pt. Experienced left arm "numbness" at home and felt she had an "uneven smile in the mirror"  Son drove her to ER   Interventions: CT, labs, neuro assessment   Event Summary: NIHSS = 0, Admit to r/o TIA, MRI to be ordered. Will transfer to 4n29 Name of Physician Notified: Dr. Roseanne Reno at 0505    at    Outcome: Stayed in room and stabalized  Event End Time: 0981  Mallie Darting

## 2013-05-18 NOTE — Consult Note (Signed)
Referring Physician: Dr. Dierdre Highman    Chief Complaint: Numbness involving left upper extremity and left side of face.  HPI: YOBANA CULLITON is an 42 y.o. female with no known medical disease and on no prescription medications presenting with acute onset of numbness involving left upper extremity and left side of the face. Patient was last known well at 0445 today. She has not been on antiplatelet therapy. There is no previous history of stroke. She agrees symptoms of numbness involving her left distal upper extremity yesterday. CT scan of her head unremarkable except for an area of nonspecific low density involving right temporal lobe. Significance is unclear. NIH stroke score was 0. Patient still had minimal residual sensory changes involving left face and distal left upper extremity.  LSN: 4:45 AM on 05/18/2013 tPA Given: No: Rapidly resolving deficits MRankin: 0  History reviewed. No pertinent past medical history.  No family history on file.   Medications: I have reviewed the patient's current medications.  ROS: History obtained from the patient  General ROS: negative for - chills, fatigue, fever, night sweats, weight gain or weight loss Psychological ROS: negative for - behavioral disorder, hallucinations, memory difficulties, mood swings or suicidal ideation Ophthalmic ROS: negative for - blurry vision, double vision, eye pain or loss of vision ENT ROS: negative for - epistaxis, nasal discharge, oral lesions, sore throat, tinnitus or vertigo Allergy and Immunology ROS: negative for - hives or itchy/watery eyes Hematological and Lymphatic ROS: negative for - bleeding problems, bruising or swollen lymph nodes Endocrine ROS: negative for - galactorrhea, hair pattern changes, polydipsia/polyuria or temperature intolerance Respiratory ROS: negative for - cough, hemoptysis, shortness of breath or wheezing Cardiovascular ROS: negative for - chest pain, dyspnea on exertion, edema or irregular  heartbeat Gastrointestinal ROS: negative for - abdominal pain, diarrhea, hematemesis, nausea/vomiting or stool incontinence Genito-Urinary ROS: negative for - dysuria, hematuria, incontinence or urinary frequency/urgency Musculoskeletal ROS: negative for - joint swelling or muscular weakness Neurological ROS: as noted in HPI Dermatological ROS: negative for rash and skin lesion changes  Physical Examination: Blood pressure 144/61, pulse 92, temperature 98 F (36.7 C), temperature source Oral, resp. rate 16, last menstrual period 04/17/2013, SpO2 99.00%.  Neurologic Examination: Mental Status: Alert, oriented, thought content appropriate.  Speech fluent without evidence of aphasia. Able to follow commands without difficulty. Cranial Nerves: II-Visual fields were normal. III/IV/VI-Pupils were equal and reacted. Extraocular movements were full and conjugate.    V/VII-no facial numbness and no facial weakness. VIII-normal. X-normal speech. Motor: 5/5 bilaterally with normal tone and bulk Sensory: Normal throughout. Deep Tendon Reflexes: 1+ and symmetric. Plantars: Mute bilaterally Cerebellar: Normal finger-to-nose and heel-to-shin testing bilaterally.  Ct Head (brain) Wo Contrast  05/18/2013   *RADIOLOGY REPORT*  Clinical Data: Left arm weakness onset of 04:00 p.m. yesterday afternoon.  CT HEAD WITHOUT CONTRAST  Technique:  Contiguous axial images were obtained from the base of the skull through the vertex without contrast.  Comparison: 10/10/2009  Findings: Cavum septum pellucidum.  Suggestion of vague low attenuation change in the right temporal lobe which could represent early changes of acute infarct.  No mass effect or midline shift. No abnormal extra-axial fluid collections.  Gray-white matter junctions are distinct.  Basal cisterns are not effaced.  No sulci effacement.  No acute intracranial hemorrhage.  No depressed skull fractures.  Visualized portions of the paranasal sinuses and  mastoid air cells are not opacified.  IMPRESSION: Nonspecific low attenuation in the right temporal lobe could represent early changes of acute  infarct.  No evidence of acute intracranial hemorrhage.  Code stroke results were telephoned to Dr. Dierdre Highman at [facility] hours on 05/18/2013.   Original Report Authenticated By: Burman Nieves, M.D.    Assessment: 42 y.o. female presenting with probable transient ischemic attack. Subcortical right MCA territory small vessel infarction cannot be ruled out. Significance of low density involving right temporal lobe is unclear and will be further evaluated with MRI study.  Stroke Risk Factors - none  Plan: 1. HgbA1c, fasting lipid panel 2. MRI, MRA  of the brain without contrast 3. PT consult, OT consult, Speech consult 4. Echocardiogram 5. Carotid dopplers  6. Prophylactic therapy-Antiplatelet med: Aspirin 7. Studies to rule out hypercoagulable state 8. Telemetry monitoring    C.R. Roseanne Reno, MD [facility] [phone]  05/18/2013, 5:36 AM

## 2013-05-19 ENCOUNTER — Inpatient Hospital Stay (HOSPITAL_COMMUNITY): Payer: Medicaid Other

## 2013-05-19 DIAGNOSIS — R030 Elevated blood-pressure reading, without diagnosis of hypertension: Secondary | ICD-10-CM

## 2013-05-19 DIAGNOSIS — F17200 Nicotine dependence, unspecified, uncomplicated: Secondary | ICD-10-CM

## 2013-05-19 DIAGNOSIS — R112 Nausea with vomiting, unspecified: Secondary | ICD-10-CM

## 2013-05-19 DIAGNOSIS — I6350 Cerebral infarction due to unspecified occlusion or stenosis of unspecified cerebral artery: Secondary | ICD-10-CM

## 2013-05-19 LAB — BASIC METABOLIC PANEL
BUN: 14 mg/dL (ref 6–23)
Chloride: 109 mEq/L (ref 96–112)
GFR calc non Af Amer: 82 mL/min — ABNORMAL LOW (ref >90)
Glucose, Bld: 96 mg/dL (ref 70–99)
Potassium: 3.7 mEq/L (ref 3.5–5.1)

## 2013-05-19 LAB — LIPID PANEL
HDL: 51 mg/dL (ref >39)
LDL Cholesterol: 127 mg/dL — ABNORMAL HIGH (ref 0–99)
Triglycerides: 118 mg/dL (ref <150)
VLDL: 24 mg/dL (ref 0–40)

## 2013-05-19 MED ORDER — ONDANSETRON HCL 4 MG/2ML IJ SOLN
INTRAMUSCULAR | Status: AC
  Filled 2013-05-19: qty 2

## 2013-05-19 MED ORDER — SODIUM CHLORIDE 0.9 % IV SOLN
INTRAVENOUS | Status: DC
Start: 2013-05-19 — End: 2013-05-21
  Administered 2013-05-19 – 2013-05-20 (×2): 1000 mL via INTRAVENOUS
  Administered 2013-05-20 – 2013-05-21 (×2): via INTRAVENOUS

## 2013-05-19 MED ORDER — ATORVASTATIN CALCIUM 20 MG PO TABS
20.0000 mg | ORAL_TABLET | Freq: Every day | ORAL | Status: DC
  Administered 2013-05-20 (×2): 20 mg via ORAL
  Filled 2013-05-19 (×3): qty 1

## 2013-05-19 MED ORDER — ACETAMINOPHEN 325 MG PO TABS
650.0000 mg | ORAL_TABLET | Freq: Once | ORAL | Status: AC
  Administered 2013-05-19: 650 mg via ORAL
  Filled 2013-05-19: qty 2

## 2013-05-19 MED ORDER — ONDANSETRON HCL 4 MG/2ML IJ SOLN
4.0000 mg | Freq: Four times a day (QID) | INTRAMUSCULAR | Status: DC | PRN
  Administered 2013-05-19: 4 mg via INTRAVENOUS

## 2013-05-19 MED ORDER — LORAZEPAM 1 MG PO TABS
1.0000 mg | ORAL_TABLET | Freq: Once | ORAL | Status: AC
  Administered 2013-05-19: 1 mg via ORAL
  Filled 2013-05-19: qty 1

## 2013-05-19 NOTE — Progress Notes (Signed)
OT Cancellation Note  Patient Details Name: [name] MRN: [id number] DOB: 01-13-1971   Cancelled Treatment:    Reason Eval/Treat Not Completed: Patient not medically ready (vomitting, new symptoms) Spoke directly with RN Tobi Bastos and Dr. Rhona Leavens - therapy to hold evaluation at this time. Therapy to await MD okay to proceed  Lucile Shutters Pager: [phone] 05/19/2013  05/19/2013, 11:08 AM

## 2013-05-19 NOTE — Progress Notes (Signed)
Stroke Team Progress Note  HISTORY [name] is a 42 y.o. female with no known medical disease and on no prescription medications presenting with acute onset of numbness involving left upper extremity and left side of the face. Patient was last known well at 0445 05/18/2013. She has not been on antiplatelet therapy. There is no previous history of stroke. She had symptoms of numbness involving her left distal upper extremity the day prior to admission . CT scan of her head unremarkable except for an area of nonspecific low density involving right temporal lobe. Significance is unclear. NIH stroke score was 0. Patient still had minimal residual sensory changes involving left face and distal left upper extremity.   LSN: 4:45 AM on 05/18/2013  tPA Given: No: Rapidly resolving deficits  MRankin: 0   SUBJECTIVE The patient was alone in the room this morning. She feels as if her symptoms are waxing and and waning. The patient's nurse reported a slight left facial droop this am.  OBJECTIVE Most recent Vital Signs: Filed Vitals:   05/18/13 2231 05/19/13 0223 05/19/13 0534 05/19/13 1055  BP: 133/80 116/67 106/75 105/59  Pulse: 50  71 69  Temp: 97.9 F (36.6 C) 97.7 F (36.5 C) 97.7 F (36.5 C) 98.3 F (36.8 C)  TempSrc: Oral Oral Oral Oral  Resp: 18 18 18 18   Height:      Weight:      SpO2: 100% 100% 98% 98%   CBG (last 3)   Recent Labs  05/18/13 0520 05/18/13 2307  GLUCAP 128* 109*    IV Fluid Intake:   . sodium chloride 1,000 mL (05/19/13 1140)    MEDICATIONS  . aspirin  325 mg Oral Daily  . heparin  5,000 Units Subcutaneous Q8H  . nicotine  21 mg Transdermal Daily  . ondansetron      . varenicline  0.5 mg Oral Daily   PRN:  ondansetron  Diet:  Cardiac thin liquids Activity:  Bathroom privileges with assistance DVT Prophylaxis:  Subcutaneous heparin  CLINICALLY SIGNIFICANT STUDIES Basic Metabolic Panel:  Recent Labs Lab 05/18/13 0456 05/18/13 0512  05/18/13 1000 05/19/13 0540  NA 140 143  --  141  K 2.7* 3.0*  --  3.7  CL 104 106  --  109  CO2 25  --   --  25  GLUCOSE 149* 143*  --  96  BUN 12 12  --  14  CREATININE 0.96 1.00 0.93 0.86  CALCIUM 8.8  --   --  8.2*   Liver Function Tests:  Recent Labs Lab 05/18/13 0456  AST 13  ALT 7  ALKPHOS 61  BILITOT 0.2*  PROT 7.5  ALBUMIN 3.6   CBC:  Recent Labs Lab 05/18/13 0456 05/18/13 0512 05/18/13 1000  WBC 7.7  --  7.2  NEUTROABS 3.5  --   --   HGB 9.7* 11.6* 9.3*  HCT 31.1* 34.0* 29.7*  MCV 68.1*  --  68.0*  PLT 273  --  259   Coagulation:  Recent Labs Lab 05/18/13 0456  LABPROT 13.0  INR 1.00   Cardiac Enzymes:  Recent Labs Lab 05/18/13 0457  TROPONINI <0.30   Urinalysis: No results found for this basename: COLORURINE, APPERANCEUR, LABSPEC, PHURINE, GLUCOSEU, HGBUR, BILIRUBINUR, KETONESUR, PROTEINUR, UROBILINOGEN, NITRITE, LEUKOCYTESUR,  in the last 168 hours Lipid Panel    Component Value Date/Time   CHOL 202* 05/19/2013 0540   TRIG 118 05/19/2013 0540   HDL 51 05/19/2013 0540   CHOLHDL 4.0  05/19/2013 0540   VLDL 24 05/19/2013 0540   LDLCALC 127* 05/19/2013 0540   HgbA1C  Lab Results  Component Value Date   HGBA1C 5.8* 05/18/2013    Urine Drug Screen:     Component Value Date/Time   LABOPIA NONE DETECTED 05/18/2013 2045   COCAINSCRNUR NONE DETECTED 05/18/2013 2045   LABBENZ NONE DETECTED 05/18/2013 2045   AMPHETMU NONE DETECTED 05/18/2013 2045   THCU NONE DETECTED 05/18/2013 2045   LABBARB NONE DETECTED 05/18/2013 2045    Alcohol Level: No results found for this basename: ETH,  in the last 168 hours  Ct Head (brain) Wo Contrast 05/18/2013  Nonspecific low attenuation in the right temporal lobe could represent early changes of acute infarct.  No evidence of acute intracranial hemorrhage.    Mri Brain Without Contrast 05/18/2013 Scattered areas of acute infarction in the right middle cerebral artery territory consistent with embolic disease.  No  swelling or hemorrhage.    MRA HEAD   Normal intracranial MR angiography of the large and medium-sized vessels.      2D Echocardiogram  ejection fraction 60-65%. No cardiac source of emboli identified.  Carotid Doppler  No significant extracranial carotid artery stenosis demonstrated. Vertebrals are patent with antegrade flow. ICA/CCA ratio is 0.84 on the right and 0.74 on the left.  CXR    EKG normal sinus rhythm rate 66 beats per minute  Therapy Recommendations pending  Physical Exam  NEUROLOGIC:    MENTAL STATUS: awake, alert, oriented, language fluent,  follows simple commands.  CRANIAL NERVES: pupils equal and reactive to light,extraocular muscles intact, facial sensation and slight left facial droop, uvula midlinec, tongue deviates to the left. MOTOR: normal bulk and tone, Strength - SENSORY: normal and symmetric to light touch  COORDINATION: finger-nose-finger with mild ataxia on the right.    ASSESSMENT [name] is a 42 y.o. female presenting with left hemisensory deficits. TPA was not given secondary to rapidly resolving symptoms. Imaging confirms scattered areas of acute infarction in the right middle cerebral artery territory consistent with embolic disease. Infarct felt to be embolic secondary to unknown etiology.  On no antithrombotics prior to admission. Now on aspirin 325 mg orally every day for secondary stroke prevention. Patient with resultant . Work up underway.   Waxing and waning of symptoms  Hypokalemia  Hypertension  Hyperlipidemia - cholesterol 202 - LDL 127  Anemia   Hospital day # 1  TREATMENT/PLAN  Continue aspirin 325 mg orally every day for secondary stroke prevention.  Modify risk factors Statin medication. LDL 127  Await therapist evaluations  Will need a TEE to look for source of emboli.  Repeat CT of the head ordered today for worsening of symptoms. CT done, and is unremarkable  Hypercoagulable state work up  [facility] Pager [phone] 05/19/2013, 12:14 PM  I have personally obtained a history, examined the patient, evaluated imaging results, and formulated the assessment and plan of care. I agree with the above. Lesly Dukes

## 2013-05-19 NOTE — Progress Notes (Signed)
Patient vomiting into bedside trashcan.  Dr. Rhona Leavens added orders and I will monitor patient.  Lance Bosch, RN

## 2013-05-19 NOTE — Progress Notes (Signed)
[facility] PROGRESS NOTE  [name] WUJ:[id number] DOB: 06/15/1971 DOA: 05/18/2013 PCP: No primary provider on file.  Assessment/Plan: CVA: - Acute infarct in the R MCA territory - Neurology following - For now, cont aspirin - Carotid dopplers with no significant stenosis - 2D echo unremarkable with EF 60-65% - This AM, pt noted feeling increased numbness over L face - STAT CT w/o contrast ordered  Tobacco abuse: - Nicotine patch as tolerated  Nausea/Vomiting: - STAT CT as above to r/o acute change - Serial cardiac enzymes to r/o nstemi - EKG unremarkable - NSR w/o st changes  Code Status: Full Family Communication: Pt in room (indicate person spoken with, relationship, and if by phone, the number) Disposition Plan: Pending   Consultants:  Neurology  Procedures:  2D echo - 05/18/13  Carotid dopplers 05/18/13  HPI/Subjective: Reports nausea/vomiting this AM, with increased L facial and L arm numbness previous to this.  Objective: Filed Vitals:   05/18/13 2231 05/19/13 0223 05/19/13 0534 05/19/13 1055  BP: 133/80 116/67 106/75 105/59  Pulse: 50  71 69  Temp: 97.9 F (36.6 C) 97.7 F (36.5 C) 97.7 F (36.5 C) 98.3 F (36.8 C)  TempSrc: Oral Oral Oral Oral  Resp: 18 18 18 18   Height:      Weight:      SpO2: 100% 100% 98% 98%   No intake or output data in the 24 hours ending 05/19/13 1146 Filed Weights   05/18/13 0645  Weight: 86.183 kg (190 lb)    Exam:   General:  Awake, in nad  Cardiovascular: regular, s1, s2  Respiratory: normal resp effort, no wheezing  Abdomen: soft, obese, nondistended  Musculoskeletal: perfused, no clubbing   Data Reviewed: Basic Metabolic Panel:  Recent Labs Lab 05/18/13 0456 05/18/13 0512 05/18/13 1000 05/19/13 0540  NA 140 143  --  141  K 2.7* 3.0*  --  3.7  CL 104 106  --  109  CO2 25  --   --  25  GLUCOSE 149* 143*  --  96  BUN 12 12  --  14  CREATININE 0.96 1.00 0.93 0.86  CALCIUM 8.8  --    --  8.2*   Liver Function Tests:  Recent Labs Lab 05/18/13 0456  AST 13  ALT 7  ALKPHOS 61  BILITOT 0.2*  PROT 7.5  ALBUMIN 3.6   No results found for this basename: LIPASE, AMYLASE,  in the last 168 hours No results found for this basename: AMMONIA,  in the last 168 hours CBC:  Recent Labs Lab 05/18/13 0456 05/18/13 0512 05/18/13 1000  WBC 7.7  --  7.2  NEUTROABS 3.5  --   --   HGB 9.7* 11.6* 9.3*  HCT 31.1* 34.0* 29.7*  MCV 68.1*  --  68.0*  PLT 273  --  259   Cardiac Enzymes:  Recent Labs Lab 05/18/13 0457  TROPONINI <0.30   BNP (last 3 results) No results found for this basename: PROBNP,  in the last 8760 hours CBG:  Recent Labs Lab 05/18/13 0520 05/18/13 2307  GLUCAP 128* 109*    No results found for this or any previous visit (from the past 240 hour(s)).   Studies: Ct Head (brain) Wo Contrast  05/18/2013   *RADIOLOGY REPORT*  Clinical Data: Left arm weakness onset of 04:00 p.m. yesterday afternoon.  CT HEAD WITHOUT CONTRAST  Technique:  Contiguous axial images were obtained from the base of the skull through the vertex without contrast.  Comparison: 10/10/2009  Findings: Cavum septum pellucidum.  Suggestion of vague low attenuation change in the right temporal lobe which could represent early changes of acute infarct.  No mass effect or midline shift. No abnormal extra-axial fluid collections.  Gray-white matter junctions are distinct.  Basal cisterns are not effaced.  No sulci effacement.  No acute intracranial hemorrhage.  No depressed skull fractures.  Visualized portions of the paranasal sinuses and mastoid air cells are not opacified.  IMPRESSION: Nonspecific low attenuation in the right temporal lobe could represent early changes of acute infarct.  No evidence of acute intracranial hemorrhage.  Code stroke results were telephoned to Dr. Dierdre Highman at [facility] hours on 05/18/2013.   Original Report Authenticated By: Burman Nieves, M.D.   Mri Brain Without  Contrast  05/18/2013   *RADIOLOGY REPORT*  Clinical Data:  Numbness of the left arm.  Possible stroke.  MRI HEAD WITHOUT CONTRAST MRA HEAD WITHOUT CONTRAST  Technique:  Multiplanar, multiecho pulse sequences of the brain and surrounding structures were obtained without intravenous contrast. Angiographic images of the head were obtained using MRA technique without contrast.  Comparison:  Head CT same day.  MRI HEAD  Findings:  There are areas of acute infarction affecting the right to parietal cortical and subcortical brain in several locations consistent with embolic disease.  The largest single area measures about to 8 mm x 20 mm.  No swelling or hemorrhage.  The remainder of the brain appears normal without evidence of old ischemic insult.  No mass lesion, hydrocephalus or extra-axial collection. No pituitary mass.  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  No inflammatory sinus disease.  IMPRESSION: Scattered areas of acute infarction in the right middle cerebral artery territory consistent with embolic disease.  No swelling or hemorrhage.  MRA HEAD  Findings: Both internal carotid arteries are widely patent into the brain.  The anterior and middle cerebral vessels are patent without proximal stenosis, aneurysm or vascular malformation.  No missing MCA branches are detected on the right.  Both vertebral arteries are patent to the basilar with the right being dominant.  No basilar stenosis.  Posterior circulation branch vessels are normal.  IMPRESSION: Normal intracranial MR angiography of the large and medium-sized vessels.   Original Report Authenticated By: Paulina Fusi, M.D.   Mr Mra Head/brain Wo Cm  05/18/2013   *RADIOLOGY REPORT*  Clinical Data:  Numbness of the left arm.  Possible stroke.  MRI HEAD WITHOUT CONTRAST MRA HEAD WITHOUT CONTRAST  Technique:  Multiplanar, multiecho pulse sequences of the brain and surrounding structures were obtained without intravenous contrast. Angiographic images of the  head were obtained using MRA technique without contrast.  Comparison:  Head CT same day.  MRI HEAD  Findings:  There are areas of acute infarction affecting the right to parietal cortical and subcortical brain in several locations consistent with embolic disease.  The largest single area measures about to 8 mm x 20 mm.  No swelling or hemorrhage.  The remainder of the brain appears normal without evidence of old ischemic insult.  No mass lesion, hydrocephalus or extra-axial collection. No pituitary mass.  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  No inflammatory sinus disease.  IMPRESSION: Scattered areas of acute infarction in the right middle cerebral artery territory consistent with embolic disease.  No swelling or hemorrhage.  MRA HEAD  Findings: Both internal carotid arteries are widely patent into the brain.  The anterior and middle cerebral vessels are patent without proximal stenosis, aneurysm or vascular  malformation.  No missing MCA branches are detected on the right.  Both vertebral arteries are patent to the basilar with the right being dominant.  No basilar stenosis.  Posterior circulation branch vessels are normal.  IMPRESSION: Normal intracranial MR angiography of the large and medium-sized vessels.   Original Report Authenticated By: Paulina Fusi, M.D.    Scheduled Meds: . aspirin  325 mg Oral Daily  . heparin  5,000 Units Subcutaneous Q8H  . nicotine  21 mg Transdermal Daily  . ondansetron      . varenicline  0.5 mg Oral Daily   Continuous Infusions: . sodium chloride 1,000 mL (05/19/13 1140)    Principal Problem:   CVA (cerebral infarction) Active Problems:   Hypokalemia   Impaired glucose metabolism   Elevated blood pressure   Tobacco use disorder    Time spent:    Miley Lindon K  [facility] Pager [phone]. If 7PM-7AM, please contact night-coverage at [email or web address], password [facility] 05/19/2013, 11:46 AM  LOS: 1 day

## 2013-05-19 NOTE — Consult Note (Signed)
Admit date: 05/18/2013 Referring Physician  Dr. Anne Hahn Primary Cardiologist  None Reason for Consultation  CVA needs TEE  HPI: [name] is a 42 y.o. female with no known medical disease and on no prescription medications presenting with acute onset of numbness involving left upper extremity and left side of the face. Patient was last known well at 0445 05/18/2013. She has not been on antiplatelet therapy. There is no previous history of stroke. She had symptoms of numbness involving her left distal upper extremity the day prior to admission . CT scan of her head unremarkable except for an area of nonspecific low density involving right temporal lobe. Significance is unclear. NIH stroke score was 0. Patient still had minimal residual sensory changes involving left face and distal left upper extremity. Imaging revealed scattered areas of acute infarction in the right MCA territory c/w embolic disease.  Cardiology is now asked to consult for TEE to evaluate for cardiac source of embolism.  2D echo was normal.        PMH:  History reviewed. No pertinent past medical history.   PSH:   Past Surgical History  Procedure Laterality Date  . Cesarean section      x 1 with 5th pregnancy  . Cholecystectomy      Allergies:  Review of patient's allergies indicates no known allergies. Prior to Admit Meds:   No prescriptions prior to admission   Fam HX:    Family History  Problem Relation Age of Onset  . Renal Disease Father     dialysis  . Hypertension Father   . Heart disease Mother     stabbed in heart   Social HX:    History   Social History  . Marital Status: Divorced    Spouse Name: N/A    Number of Children: N/A  . Years of Education: N/A   Occupational History  . Not on file.   Social History Main Topics  . Smoking status: Current Every Day Smoker -- 0.50 packs/day for 28 years  . Smokeless tobacco: Never Used     Comment: tried nothing for cessation  . Alcohol Use: Yes      Comment: occasional  . Drug Use: No  . Sexually Active: Not on file   Other Topics Concern  . Not on file   Social History Narrative  . No narrative on file     ROS:  All 11 ROS were addressed and are negative except what is stated in the HPI  Physical Exam: Blood pressure 110/81, pulse 63, temperature 97.9 F (36.6 C), temperature source Oral, resp. rate 18, height 5\' 2"  (1.575 m), weight 86.183 kg (190 lb), last menstrual period 04/17/2013, SpO2 100.00%.    General: Well developed, well nourished, in no acute distress Head: Eyes PERRLA, No xanthomas.   Normal cephalic and atramatic  Lungs:   Clear bilaterally to auscultation and percussion. Heart:   HRRR S1 S2 Pulses are 2+ & equal.            No carotid bruit. No JVD.  No abdominal bruits. No femoral bruits. Abdomen: Bowel sounds are positive, abdomen soft and non-tender without masses Extremities:   No clubbing, cyanosis or edema.  DP +1 Neuro: Alert and oriented X 3. Psych:  Good affect, responds appropriately    Labs:   Lab Results  Component Value Date   WBC 7.2 05/18/2013   HGB 9.3* 05/18/2013   HCT 29.7* 05/18/2013   MCV 68.0* 05/18/2013  PLT 259 05/18/2013    Recent Labs Lab 05/18/13 0456  05/19/13 0540  NA 140  < > 141  K 2.7*  < > 3.7  CL 104  < > 109  CO2 25  --  25  BUN 12  < > 14  CREATININE 0.96  < > 0.86  CALCIUM 8.8  --  8.2*  PROT 7.5  --   --   BILITOT 0.2*  --   --   ALKPHOS 61  --   --   ALT 7  --   --   AST 13  --   --   GLUCOSE 149*  < > 96  < > = values in this interval not displayed. No results found for this basename: PTT   Lab Results  Component Value Date   INR 1.00 05/18/2013   Lab Results  Component Value Date   TROPONINI <0.30 05/19/2013     Lab Results  Component Value Date   CHOL 202* 05/19/2013   Lab Results  Component Value Date   HDL 51 05/19/2013   Lab Results  Component Value Date   LDLCALC 127* 05/19/2013   Lab Results  Component Value Date   TRIG  118 05/19/2013   Lab Results  Component Value Date   CHOLHDL 4.0 05/19/2013   No results found for this basename: LDLDIRECT      Radiology:  Ct Head Wo Contrast  05/19/2013   *RADIOLOGY REPORT*  Clinical Data: CT head left arm and face numbness and weakness. Rule out CVA  CT HEAD WITHOUT CONTRAST  Comparison:  MRI 05/18/2013  Findings:  There is no evidence of fracture or dislocation.  There is no evidence of arthropathy or other focal bone abnormality. Soft tissues are unremarkable. Small area of acute infarct in the right frontal parietal lobe on the recent MRI is not seen on the current CT.  IMPRESSION: Negative.   Original Report Authenticated By: Janeece Riggers, M.D.   Ct Head (brain) Wo Contrast  05/18/2013   *RADIOLOGY REPORT*  Clinical Data: Left arm weakness onset of 04:00 p.m. yesterday afternoon.  CT HEAD WITHOUT CONTRAST  Technique:  Contiguous axial images were obtained from the base of the skull through the vertex without contrast.  Comparison: 10/10/2009  Findings: Cavum septum pellucidum.  Suggestion of vague low attenuation change in the right temporal lobe which could represent early changes of acute infarct.  No mass effect or midline shift. No abnormal extra-axial fluid collections.  Gray-white matter junctions are distinct.  Basal cisterns are not effaced.  No sulci effacement.  No acute intracranial hemorrhage.  No depressed skull fractures.  Visualized portions of the paranasal sinuses and mastoid air cells are not opacified.  IMPRESSION: Nonspecific low attenuation in the right temporal lobe could represent early changes of acute infarct.  No evidence of acute intracranial hemorrhage.  Code stroke results were telephoned to Dr. Dierdre Highman at [facility] hours on 05/18/2013.   Original Report Authenticated By: Burman Nieves, M.D.   Mri Brain Without Contrast  05/18/2013   *RADIOLOGY REPORT*  Clinical Data:  Numbness of the left arm.  Possible stroke.  MRI HEAD WITHOUT CONTRAST MRA HEAD  WITHOUT CONTRAST  Technique:  Multiplanar, multiecho pulse sequences of the brain and surrounding structures were obtained without intravenous contrast. Angiographic images of the head were obtained using MRA technique without contrast.  Comparison:  Head CT same day.  MRI HEAD  Findings:  There are areas of acute infarction affecting the right  to parietal cortical and subcortical brain in several locations consistent with embolic disease.  The largest single area measures about to 8 mm x 20 mm.  No swelling or hemorrhage.  The remainder of the brain appears normal without evidence of old ischemic insult.  No mass lesion, hydrocephalus or extra-axial collection. No pituitary mass.  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  No inflammatory sinus disease.  IMPRESSION: Scattered areas of acute infarction in the right middle cerebral artery territory consistent with embolic disease.  No swelling or hemorrhage.  MRA HEAD  Findings: Both internal carotid arteries are widely patent into the brain.  The anterior and middle cerebral vessels are patent without proximal stenosis, aneurysm or vascular malformation.  No missing MCA branches are detected on the right.  Both vertebral arteries are patent to the basilar with the right being dominant.  No basilar stenosis.  Posterior circulation branch vessels are normal.  IMPRESSION: Normal intracranial MR angiography of the large and medium-sized vessels.   Original Report Authenticated By: Paulina Fusi, M.D.   Mr Mra Head/brain Wo Cm  05/18/2013   *RADIOLOGY REPORT*  Clinical Data:  Numbness of the left arm.  Possible stroke.  MRI HEAD WITHOUT CONTRAST MRA HEAD WITHOUT CONTRAST  Technique:  Multiplanar, multiecho pulse sequences of the brain and surrounding structures were obtained without intravenous contrast. Angiographic images of the head were obtained using MRA technique without contrast.  Comparison:  Head CT same day.  MRI HEAD  Findings:  There are areas of acute  infarction affecting the right to parietal cortical and subcortical brain in several locations consistent with embolic disease.  The largest single area measures about to 8 mm x 20 mm.  No swelling or hemorrhage.  The remainder of the brain appears normal without evidence of old ischemic insult.  No mass lesion, hydrocephalus or extra-axial collection. No pituitary mass.  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  No inflammatory sinus disease.  IMPRESSION: Scattered areas of acute infarction in the right middle cerebral artery territory consistent with embolic disease.  No swelling or hemorrhage.  MRA HEAD  Findings: Both internal carotid arteries are widely patent into the brain.  The anterior and middle cerebral vessels are patent without proximal stenosis, aneurysm or vascular malformation.  No missing MCA branches are detected on the right.  Both vertebral arteries are patent to the basilar with the right being dominant.  No basilar stenosis.  Posterior circulation branch vessels are normal.  IMPRESSION: Normal intracranial MR angiography of the large and medium-sized vessels.   Original Report Authenticated By: Paulina Fusi, M.D.    EKG:  NSR with very small lateral Q waves that are most likely nondiagnostic  ASSESSMENT:  1. 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  Plan TEE on Monday unless patient is discharged and if discharged will plan outpt TEE next week.  Please call [phone] if patient is going to be discharged over the weekend.  Quintella Reichert, MD  05/19/2013  3:38 PM

## 2013-05-19 NOTE — Progress Notes (Signed)
Danesha Kirchoff H. Kabao Leite, PT, MBA Acute Rehab Services Pager [phone]  

## 2013-05-19 NOTE — Progress Notes (Signed)
Called into room by patient who said the left side of her face felt numb.  Upon inspection a slight left facial droop was noted and the left cheek had decreased sensation when compared with the right.  Dr. Rhona Leavens and Armida Sans, PA on the floor and asked to see the patient.  They are both at bedside now.  Lance Bosch, RN

## 2013-05-20 LAB — TROPONIN I: Troponin I: <0.3 ng/mL (ref <0.30)

## 2013-05-20 LAB — GLUCOSE, CAPILLARY: Glucose-Capillary: 95 mg/dL (ref 70–99)

## 2013-05-20 MED ORDER — ASPIRIN 325 MG PO TABS: 325.0000 mg | ORAL_TABLET | Freq: Every day | ORAL | Status: DC

## 2013-05-20 MED ORDER — ATORVASTATIN CALCIUM 20 MG PO TABS: 20.0000 mg | ORAL_TABLET | Freq: Every day | ORAL | Status: DC

## 2013-05-20 MED ORDER — PNEUMOCOCCAL VAC POLYVALENT 25 MCG/0.5ML IJ INJ
0.5000 mL | INJECTION | INTRAMUSCULAR | Status: AC
  Administered 2013-05-21: 0.5 mL via INTRAMUSCULAR
  Filled 2013-05-20: qty 0.5

## 2013-05-20 MED ORDER — NICOTINE 21 MG/24HR TD PT24: 1.0000 | MEDICATED_PATCH | Freq: Every day | TRANSDERMAL | Status: DC

## 2013-05-20 MED ORDER — VARENICLINE TARTRATE 0.5 MG PO TABS: 0.5000 mg | ORAL_TABLET | Freq: Every day | ORAL | Status: DC

## 2013-05-20 MED ORDER — ZOLPIDEM TARTRATE 5 MG PO TABS
5.0000 mg | ORAL_TABLET | Freq: Every evening | ORAL | Status: DC | PRN
  Administered 2013-05-20: 5 mg via ORAL
  Filled 2013-05-20: qty 1

## 2013-05-20 NOTE — Progress Notes (Signed)
[facility] PROGRESS NOTE  [name]:[id number] DOB: August 19, 1971 DOA: 05/18/2013 PCP: No primary provider on file.  Assessment/Plan: CVA:  - Acute infarct in the R MCA territory  - Neurology following  - For now, cont aspirin  - Carotid dopplers with no significant stenosis  - 2D echo unremarkable with EF 60-65%  - For TEE tomorrow. Tobacco abuse:  - Nicotine patch as tolerated  - Also on chantix Nausea/Vomiting:  - STAT CT as above to r/o acute change  - Serial cardiac enzymes to r/o nstemi  - EKG unremarkable - NSR w/o st changes - Question medication induced? - reports nausea when taking chantix with ASA together today and when she started chantix  Code Status: Full Family Communication: Pt in room (indicate person spoken with, relationship, and if by phone, the number) Disposition Plan: Pending   Consultants:  Neurology  Procedures: 2D echo - 05/18/13  Carotid dopplers 05/18/13  HPI/Subjective: No major complaints  Objective: Filed Vitals:   05/19/13 2200 05/20/13 0152 05/20/13 0600 05/20/13 1020  BP: 118/67 96/47 108/67 112/69  Pulse: 57 64 75 59  Temp: 97.7 F (36.5 C) 97.9 F (36.6 C) 97.8 F (36.6 C) 98.2 F (36.8 C)  TempSrc: Oral Oral Oral Oral  Resp: 18 18 18 18   Height:      Weight:      SpO2: 100% 100% 99% 100%    Intake/Output Summary (Last 24 hours) at 05/20/13 1304 Last data filed at 05/20/13 0500  Gross per 24 hour  Intake   1300 ml  Output      0 ml  Net   1300 ml   Filed Weights   05/18/13 0645  Weight: 86.183 kg (190 lb)    Exam:   General:  Awake, in nad  Cardiovascular: regular, s1, s2  Respiratory: normal resp effort, no wheezing  Abdomen: soft, nondistended  Musculoskeletal: perfused, no clubbing   Data Reviewed: Basic Metabolic Panel:  Recent Labs Lab 05/18/13 0456 05/18/13 0512 05/18/13 1000 05/19/13 0540  NA 140 143  --  141  K 2.7* 3.0*  --  3.7  CL 104 106  --  109  CO2 25  --   --  25   GLUCOSE 149* 143*  --  96  BUN 12 12  --  14  CREATININE 0.96 1.00 0.93 0.86  CALCIUM 8.8  --   --  8.2*   Liver Function Tests:  Recent Labs Lab 05/18/13 0456  AST 13  ALT 7  ALKPHOS 61  BILITOT 0.2*  PROT 7.5  ALBUMIN 3.6   No results found for this basename: LIPASE, AMYLASE,  in the last 168 hours No results found for this basename: AMMONIA,  in the last 168 hours CBC:  Recent Labs Lab 05/18/13 0456 05/18/13 0512 05/18/13 1000  WBC 7.7  --  7.2  NEUTROABS 3.5  --   --   HGB 9.7* 11.6* 9.3*  HCT 31.1* 34.0* 29.7*  MCV 68.1*  --  68.0*  PLT 273  --  259   Cardiac Enzymes:  Recent Labs Lab 05/19/13 1400 05/19/13 1605 05/19/13 2250 05/20/13 0709 05/20/13 1109  TROPONINI <0.30 <0.30 <0.30 <0.30 <0.30   BNP (last 3 results) No results found for this basename: PROBNP,  in the last 8760 hours CBG:  Recent Labs Lab 05/18/13 0520 05/18/13 2307  GLUCAP 128* 109*    No results found for this or any previous visit (from the past 240 hour(s)).   Studies:  Ct Head Wo Contrast  05/19/2013   *RADIOLOGY REPORT*  Clinical Data: CT head left arm and face numbness and weakness. Rule out CVA  CT HEAD WITHOUT CONTRAST  Comparison:  MRI 05/18/2013  Findings:  There is no evidence of fracture or dislocation.  There is no evidence of arthropathy or other focal bone abnormality. Soft tissues are unremarkable. Small area of acute infarct in the right frontal parietal lobe on the recent MRI is not seen on the current CT.  IMPRESSION: Negative.   Original Report Authenticated By: Janeece Riggers, M.D.    Scheduled Meds: . aspirin  325 mg Oral Daily  . atorvastatin  20 mg Oral q1800  . heparin  5,000 Units Subcutaneous Q8H  . nicotine  21 mg Transdermal Daily  . [START ON 05/21/2013] pneumococcal 23 valent vaccine  0.5 mL Intramuscular Tomorrow-1000  . varenicline  0.5 mg Oral Daily   Continuous Infusions: . sodium chloride 75 mL/hr at 05/20/13 0104    Principal Problem:    CVA (cerebral infarction) Active Problems:   Hypokalemia   Impaired glucose metabolism   Elevated blood pressure   Tobacco use disorder    Time spent:    Trinady Milewski K  [facility] Pager [phone]. If 7PM-7AM, please contact night-coverage at [email or web address], password [facility] 05/20/2013, 1:04 PM  LOS: 2 days

## 2013-05-20 NOTE — Discharge Summary (Addendum)
Physician Discharge Summary  [name]:[id number] DOB: 28-Feb-1971 DOA: 05/18/2013  PCP: No primary provider on file.  Admit date: 05/18/2013 Discharge date: 05/22/13  Time spent: 30 minutes  Recommendations for Outpatient Follow-up:  1. Establish with PCP in 1-2 weeks as scheduled 2. Follow up with Dr. Pearlean Brownie in 2 months and schedule TCD bubble study at that time 3. Follow up on RPR titer of 1:8  Discharge Diagnoses:  Principal Problem:   CVA (cerebral infarction) Active Problems:   Hypokalemia   Impaired glucose metabolism   Elevated blood pressure   Tobacco use disorder   Discharge Condition: Stable  Diet recommendation: Heart Healthy  Filed Weights   05/18/13 0645  Weight: 86.183 kg (190 lb)    History of present illness:  [name] is a 42 y.o. female AA, [phone], who had an episode at work wherein she has some numbness to the L arm yesterday at about 16:00. This lasted for about 30 min and this finally went away. She usually works 2nd shift 3-11pm. She is CNA at [employer], and was at home watchig at about 04:00 am .She thinks that about 04:15 she got up to get her cell phone and felt numbness in her L arm as well as her L face and "tried to smile" and only one side of her mouth was smiling [r side] and presented here to the ED. This has never hapeened before.  Was fullay alert and oriented at this time, no Sz h/o, no real neuro h/o however she states about in 2012 she had a cervical "pinched nerve"   Hospital Course:  CVA:  - Acute infarct was noted in the R MCA territory  - Neurology was consulted  - The patient was continued on 325mg  of aspirin  - Carotid dopplers showed no significant stenosis  - 2D echo was unremarkable with EF 60-65%  - TEE was done on 05/21/13 which showed no thrombus - Per Neurology, and outpatient bubble study was recommended Tobacco abuse:  - Nicotine patch as tolerated  - Also started on chantix  - The patient is adamant  about quitting smoking Nausea/Vomiting:  - Serial cardiac enzymes were negative - EKG was unremarkable - NSR w/o st changes   Procedures: 2D echo - 05/18/13  Carotid dopplers 05/18/13 TEE on 05/21/13 - no thrombi  Consultations:  Neurology  Cardiology  Discharge Exam: Filed Vitals:   05/21/13 1050 05/21/13 1056 05/21/13 1100 05/21/13 1355  BP: 118/72 124/78 104/65 116/65  Pulse:   59 65  Temp:   98 F (36.7 C) 97.3 F (36.3 C)  TempSrc:   Oral Oral  Resp:  22 22 20   Height:      Weight:      SpO2:  97% 99% 100%    General: Awake, in nad Cardiovascular: regular, s1, s2 Respiratory: normal resp effort, no wheezing  Discharge Instructions       Future Appointments Provider Department Dept Phone   06/01/2013 11:00 AM [facility] Covering Provider [facility] [phone]       Medication List         aspirin 325 MG tablet  Take 1 tablet (325 mg total) by mouth daily.     atorvastatin 20 MG tablet  Commonly known as:  LIPITOR  Take 1 tablet (20 mg total) by mouth daily at 6 PM.     gabapentin 100 MG capsule  Commonly known as:  NEURONTIN  Take 1 capsule (100 mg  total) by mouth 3 (three) times daily.     nicotine 21 mg/24hr patch  Commonly known as:  NICODERM CQ - dosed in mg/24 hours  Place 1 patch onto the skin daily.     varenicline 0.5 MG tablet  Commonly known as:  CHANTIX  Take 1 tablet (0.5 mg total) by mouth daily.       No Known Allergies Follow-up Information   Follow up with [facility]     On 06/01/2013. (11:00)    Contact information:   [address] [place] [address]   [phone]  Please bring all medication bottles to your appointment.  There is a $20 copay due at the time of appointment.      Follow up with Gates Rigg, MD In 2 months. (please schedule TCD bubble study)    Contact information:   [address] [place]  [address] [phone]        The results of significant diagnostics from this hospitalization (including imaging, microbiology, ancillary and laboratory) are listed below for reference.    Significant Diagnostic Studies: Ct Head Wo Contrast  05/19/2013   *RADIOLOGY REPORT*  Clinical Data: CT head left arm and face numbness and weakness. Rule out CVA  CT HEAD WITHOUT CONTRAST  Comparison:  MRI 05/18/2013  Findings:  There is no evidence of fracture or dislocation.  There is no evidence of arthropathy or other focal bone abnormality. Soft tissues are unremarkable. Small area of acute infarct in the right frontal parietal lobe on the recent MRI is not seen on the current CT.  IMPRESSION: Negative.   Original Report Authenticated By: Janeece Riggers, M.D.   Ct Head (brain) Wo Contrast  05/18/2013   *RADIOLOGY REPORT*  Clinical Data: Left arm weakness onset of 04:00 p.m. yesterday afternoon.  CT HEAD WITHOUT CONTRAST  Technique:  Contiguous axial images were obtained from the base of the skull through the vertex without contrast.  Comparison: 10/10/2009  Findings: Cavum septum pellucidum.  Suggestion of vague low attenuation change in the right temporal lobe which could represent early changes of acute infarct.  No mass effect or midline shift. No abnormal extra-axial fluid collections.  Gray-white matter junctions are distinct.  Basal cisterns are not effaced.  No sulci effacement.  No acute intracranial hemorrhage.  No depressed skull fractures.  Visualized portions of the paranasal sinuses and mastoid air cells are not opacified.  IMPRESSION: Nonspecific low attenuation in the right temporal lobe could represent early changes of acute infarct.  No evidence of acute intracranial hemorrhage.  Code stroke results were telephoned to Dr. Dierdre Highman at [facility] hours on 05/18/2013.   Original Report Authenticated By: Burman Nieves, M.D.   Mri Brain Without Contrast  05/18/2013   *RADIOLOGY REPORT*  Clinical Data:   Numbness of the left arm.  Possible stroke.  MRI HEAD WITHOUT CONTRAST MRA HEAD WITHOUT CONTRAST  Technique:  Multiplanar, multiecho pulse sequences of the brain and surrounding structures were obtained without intravenous contrast. Angiographic images of the head were obtained using MRA technique without contrast.  Comparison:  Head CT same day.  MRI HEAD  Findings:  There are areas of acute infarction affecting the right to parietal cortical and subcortical brain in several locations consistent with embolic disease.  The largest single area measures about to 8 mm x 20 mm.  No swelling or hemorrhage.  The remainder of the brain appears normal without evidence of old ischemic insult.  No mass  lesion, hydrocephalus or extra-axial collection. No pituitary mass.  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  No inflammatory sinus disease.  IMPRESSION: Scattered areas of acute infarction in the right middle cerebral artery territory consistent with embolic disease.  No swelling or hemorrhage.  MRA HEAD  Findings: Both internal carotid arteries are widely patent into the brain.  The anterior and middle cerebral vessels are patent without proximal stenosis, aneurysm or vascular malformation.  No missing MCA branches are detected on the right.  Both vertebral arteries are patent to the basilar with the right being dominant.  No basilar stenosis.  Posterior circulation branch vessels are normal.  IMPRESSION: Normal intracranial MR angiography of the large and medium-sized vessels.   Original Report Authenticated By: Paulina Fusi, M.D.   Mr Mra Head/brain Wo Cm  05/18/2013   *RADIOLOGY REPORT*  Clinical Data:  Numbness of the left arm.  Possible stroke.  MRI HEAD WITHOUT CONTRAST MRA HEAD WITHOUT CONTRAST  Technique:  Multiplanar, multiecho pulse sequences of the brain and surrounding structures were obtained without intravenous contrast. Angiographic images of the head were obtained using MRA technique without contrast.   Comparison:  Head CT same day.  MRI HEAD  Findings:  There are areas of acute infarction affecting the right to parietal cortical and subcortical brain in several locations consistent with embolic disease.  The largest single area measures about to 8 mm x 20 mm.  No swelling or hemorrhage.  The remainder of the brain appears normal without evidence of old ischemic insult.  No mass lesion, hydrocephalus or extra-axial collection. No pituitary mass.  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  No inflammatory sinus disease.  IMPRESSION: Scattered areas of acute infarction in the right middle cerebral artery territory consistent with embolic disease.  No swelling or hemorrhage.  MRA HEAD  Findings: Both internal carotid arteries are widely patent into the brain.  The anterior and middle cerebral vessels are patent without proximal stenosis, aneurysm or vascular malformation.  No missing MCA branches are detected on the right.  Both vertebral arteries are patent to the basilar with the right being dominant.  No basilar stenosis.  Posterior circulation branch vessels are normal.  IMPRESSION: Normal intracranial MR angiography of the large and medium-sized vessels.   Original Report Authenticated By: Paulina Fusi, M.D.    Microbiology: No results found for this or any previous visit (from the past 240 hour(s)).   Labs: Basic Metabolic Panel:  Recent Labs Lab 05/18/13 0456 05/18/13 0512 05/18/13 1000 05/19/13 0540  NA 140 143  --  141  K 2.7* 3.0*  --  3.7  CL 104 106  --  109  CO2 25  --   --  25  GLUCOSE 149* 143*  --  96  BUN 12 12  --  14  CREATININE 0.96 1.00 0.93 0.86  CALCIUM 8.8  --   --  8.2*   Liver Function Tests:  Recent Labs Lab 05/18/13 0456  AST 13  ALT 7  ALKPHOS 61  BILITOT 0.2*  PROT 7.5  ALBUMIN 3.6   No results found for this basename: LIPASE, AMYLASE,  in the last 168 hours No results found for this basename: AMMONIA,  in the last 168 hours CBC:  Recent Labs Lab  05/18/13 0456 05/18/13 0512 05/18/13 1000  WBC 7.7  --  7.2  NEUTROABS 3.5  --   --   HGB 9.7* 11.6* 9.3*  HCT 31.1* 34.0* 29.7*  MCV 68.1*  --  68.0*  PLT 273  --  259   Cardiac Enzymes:  Recent Labs Lab 05/20/13 1109 05/20/13 1645 05/20/13 2351 05/21/13 0540 05/21/13 1120  TROPONINI <0.30 <0.30 <0.30 <0.30 <0.30   BNP: BNP (last 3 results) No results found for this basename: PROBNP,  in the last 8760 hours CBG:  Recent Labs Lab 05/18/13 0520 05/18/13 2307 05/20/13 2152 05/21/13 0648 05/21/13 1143  GLUCAP 128* 109* 95 89 87       Signed:  CHIU, STEPHEN K  [facility] 05/21/2013, 4:05 PM

## 2013-05-20 NOTE — Progress Notes (Signed)
PT Cancellation Note  Patient Details Name: [name] MRN: [id number] DOB: [birth year]   Cancelled Treatment:    Reason Eval/Treat Not Completed: PT screened, no needs identified, will sign off (Pt reports up in room with no mobility problems.) D/w nurse.  Should medical status change please reorder PT.  Thank you.   [name] [facility] 05/20/2013, 3:30 PM

## 2013-05-20 NOTE — Progress Notes (Signed)
Stroke Team Progress Note  HISTORY [name] is a 42 y.o. female with no known medical disease and on no prescription medications presenting with acute onset of numbness involving left upper extremity and left side of the face. Patient was last known well at 0445 05/18/2013. She has not been on antiplatelet therapy. There is no previous history of stroke. She had symptoms of numbness involving her left distal upper extremity the day prior to admission . CT scan of her head unremarkable except for an area of nonspecific low density involving right temporal lobe. Significance is unclear. NIH stroke score was 0. Patient still had minimal residual sensory changes involving left face and distal left upper extremity.   LSN: 4:45 AM on 05/18/2013  tPA Given: No: Rapidly resolving deficits  MRankin: 0   SUBJECTIVE The patient was alone in the room this morning. She feels as if she is doing quite well.  OBJECTIVE Most recent Vital Signs: Filed Vitals:   05/19/13 2200 05/20/13 0152 05/20/13 0600 05/20/13 1020  BP: 118/67 96/47 108/67 112/69  Pulse: 57 64 75 59  Temp: 97.7 F (36.5 C) 97.9 F (36.6 C) 97.8 F (36.6 C) 98.2 F (36.8 C)  TempSrc: Oral Oral Oral Oral  Resp: 18 18 18 18   Height:      Weight:      SpO2: 100% 100% 99% 100%   CBG (last 3)   Recent Labs  05/18/13 0520 05/18/13 2307  GLUCAP 128* 109*    IV Fluid Intake:   . sodium chloride 75 mL/hr at 05/20/13 0104    MEDICATIONS  . aspirin  325 mg Oral Daily  . atorvastatin  20 mg Oral q1800  . heparin  5,000 Units Subcutaneous Q8H  . nicotine  21 mg Transdermal Daily  . [START ON 05/21/2013] pneumococcal 23 valent vaccine  0.5 mL Intramuscular Tomorrow-1000  . varenicline  0.5 mg Oral Daily   PRN:  ondansetron  Diet:  Cardiac thin liquids Activity:  Bathroom privileges with assistance DVT Prophylaxis:  Subcutaneous heparin  CLINICALLY SIGNIFICANT STUDIES Basic Metabolic Panel:   Recent Labs Lab  05/18/13 0456 05/18/13 0512 05/18/13 1000 05/19/13 0540  NA 140 143  --  141  K 2.7* 3.0*  --  3.7  CL 104 106  --  109  CO2 25  --   --  25  GLUCOSE 149* 143*  --  96  BUN 12 12  --  14  CREATININE 0.96 1.00 0.93 0.86  CALCIUM 8.8  --   --  8.2*   Liver Function Tests:   Recent Labs Lab 05/18/13 0456  AST 13  ALT 7  ALKPHOS 61  BILITOT 0.2*  PROT 7.5  ALBUMIN 3.6   CBC:   Recent Labs Lab 05/18/13 0456 05/18/13 0512 05/18/13 1000  WBC 7.7  --  7.2  NEUTROABS 3.5  --   --   HGB 9.7* 11.6* 9.3*  HCT 31.1* 34.0* 29.7*  MCV 68.1*  --  68.0*  PLT 273  --  259   Coagulation:   Recent Labs Lab 05/18/13 0456  LABPROT 13.0  INR 1.00   Cardiac Enzymes:   Recent Labs Lab 05/19/13 1605 05/19/13 2250 05/20/13 0709  TROPONINI <0.30 <0.30 <0.30   Urinalysis: No results found for this basename: COLORURINE, APPERANCEUR, LABSPEC, PHURINE, GLUCOSEU, HGBUR, BILIRUBINUR, KETONESUR, PROTEINUR, UROBILINOGEN, NITRITE, LEUKOCYTESUR,  in the last 168 hours Lipid Panel    Component Value Date/Time   CHOL 202* 05/19/2013 0540  TRIG 118 05/19/2013 0540   HDL 51 05/19/2013 0540   CHOLHDL 4.0 05/19/2013 0540   VLDL 24 05/19/2013 0540   LDLCALC 127* 05/19/2013 0540   HgbA1C  Lab Results  Component Value Date   HGBA1C 5.8* 05/18/2013    Urine Drug Screen:     Component Value Date/Time   LABOPIA NONE DETECTED 05/18/2013 2045   COCAINSCRNUR NONE DETECTED 05/18/2013 2045   LABBENZ NONE DETECTED 05/18/2013 2045   AMPHETMU NONE DETECTED 05/18/2013 2045   THCU NONE DETECTED 05/18/2013 2045   LABBARB NONE DETECTED 05/18/2013 2045    Alcohol Level: No results found for this basename: ETH,  in the last 168 hours  Ct Head (brain) Wo Contrast 05/18/2013  Nonspecific low attenuation in the right temporal lobe could represent early changes of acute infarct.  No evidence of acute intracranial hemorrhage.    Mri Brain Without Contrast 05/18/2013 Scattered areas of acute infarction in the  right middle cerebral artery territory consistent with embolic disease.  No swelling or hemorrhage.    MRA HEAD   Normal intracranial MR angiography of the large and medium-sized vessels.      2D Echocardiogram  ejection fraction 60-65%. No cardiac source of emboli identified.  Carotid Doppler  No significant extracranial carotid artery stenosis demonstrated. Vertebrals are patent with antegrade flow. ICA/CCA ratio is 0.84 on the right and 0.74 on the left.  CXR    EKG normal sinus rhythm rate 66 beats per minute  Therapy Recommendations pending  Physical Exam  NEUROLOGIC:    MENTAL STATUS: awake, alert, oriented, language fluent,  follows simple commands.  CRANIAL NERVES: pupils equal and reactive to light,extraocular muscles intact, facial sensation and slight left facial droop, uvula midline MOTOR: normal bulk and tone, Strength - SENSORY: normal and symmetric to light touch  COORDINATION: finger-nose-finger and heel to shin are symmetric.    ASSESSMENT [name] is a 42 y.o. female presenting with left hemisensory deficits. TPA was not given secondary to rapidly resolving symptoms. Imaging confirms scattered areas of acute infarction in the right middle cerebral artery territory consistent with embolic disease. Infarct felt to be embolic secondary to unknown etiology.  On no antithrombotics prior to admission. Now on aspirin 325 mg orally every day for secondary stroke prevention. Patient with resultant . Work up underway.   Waxing and waning of symptoms  Hypokalemia  Hypertension  Hyperlipidemia - cholesterol 202 - LDL 127  Anemia   Hospital day # 2  TREATMENT/PLAN  Continue aspirin 325 mg orally every day for secondary stroke prevention.  Modify risk factors Statin medication. LDL 127  Await therapist evaluations  Will need a TEE to look for source of emboli. Plan to do this Monday  Hypercoagulable state work up ordered, pending    05/20/2013,  11:41 AM  Lesly Dukes

## 2013-05-21 ENCOUNTER — Encounter (HOSPITAL_COMMUNITY): Payer: Self-pay

## 2013-05-21 ENCOUNTER — Encounter (HOSPITAL_COMMUNITY)
Admission: EM | Disposition: A | Discharge status: Home / Self Care | Payer: Self-pay | Source: Home / Self Care | Attending: Internal Medicine

## 2013-05-21 DIAGNOSIS — R7309 Other abnormal glucose: Secondary | ICD-10-CM

## 2013-05-21 HISTORY — PX: TEE WITHOUT CARDIOVERSION: SHX5443

## 2013-05-21 LAB — TROPONIN I: Troponin I: <0.3 ng/mL (ref <0.30)

## 2013-05-21 LAB — GLUCOSE, CAPILLARY
Glucose-Capillary: 87 mg/dL (ref 70–99)
Glucose-Capillary: 113 mg/dL — ABNORMAL HIGH (ref 70–99)

## 2013-05-21 LAB — LUPUS ANTICOAGULANT PANEL

## 2013-05-21 LAB — CARDIOLIPIN ANTIBODIES, IGG, IGM, IGA: Anticardiolipin IgM: 2 MPL U/mL — ABNORMAL LOW (ref <11)

## 2013-05-21 LAB — C4 COMPLEMENT: Complement C4, Body Fluid: 27 mg/dL (ref 10–40)

## 2013-05-21 LAB — ANTITHROMBIN III: AntiThromb III Func: 83 % (ref 75–120)

## 2013-05-21 SURGERY — ECHOCARDIOGRAM, TRANSESOPHAGEAL
Anesthesia: Moderate Sedation

## 2013-05-21 MED ORDER — LIDOCAINE VISCOUS 2 % MT SOLN
OROMUCOSAL | Status: AC
  Filled 2013-05-21: qty 15

## 2013-05-21 MED ORDER — LIDOCAINE VISCOUS 2 % MT SOLN
OROMUCOSAL | Status: DC | PRN
  Administered 2013-05-21: 5 mL via OROMUCOSAL

## 2013-05-21 MED ORDER — BUTAMBEN-TETRACAINE-BENZOCAINE 2-2-14 % EX AERO
INHALATION_SPRAY | CUTANEOUS | Status: DC | PRN
  Administered 2013-05-21: 1 via TOPICAL

## 2013-05-21 MED ORDER — GABAPENTIN 100 MG PO CAPS
100.0000 mg | ORAL_CAPSULE | Freq: Three times a day (TID) | ORAL | Status: DC
  Filled 2013-05-21 (×2): qty 1

## 2013-05-21 MED ORDER — MIDAZOLAM HCL 10 MG/2ML IJ SOLN
INTRAMUSCULAR | Status: DC | PRN
  Administered 2013-05-21 (×2): 2 mg via INTRAVENOUS

## 2013-05-21 MED ORDER — GABAPENTIN 100 MG PO CAPS: 100.0000 mg | ORAL_CAPSULE | Freq: Three times a day (TID) | ORAL | Status: DC

## 2013-05-21 MED ORDER — ACETAMINOPHEN 325 MG PO TABS: 650.0000 mg | ORAL_TABLET | Freq: Four times a day (QID) | ORAL | Status: DC | PRN

## 2013-05-21 MED ORDER — SODIUM CHLORIDE 0.9 % IV SOLN: INTRAVENOUS | Status: DC

## 2013-05-21 MED ORDER — FENTANYL CITRATE 0.05 MG/ML IJ SOLN
INTRAMUSCULAR | Status: DC | PRN
  Administered 2013-05-21: 25 ug via INTRAVENOUS
  Administered 2013-05-21 (×2): 12.5 ug via INTRAVENOUS

## 2013-05-21 MED ORDER — ZOLPIDEM TARTRATE 5 MG PO TABS: 5.0000 mg | ORAL_TABLET | Freq: Every evening | ORAL | Status: DC | PRN

## 2013-05-21 NOTE — Progress Notes (Addendum)
[facility] PROGRESS NOTE  [name] GNF:[id number] DOB: [birth year] DOA: 05/18/2013 PCP: No primary provider on file.  Assessment/Plan: CVA:  - Acute infarct in the R MCA territory  - Neurology following  - For now, cont aspirin  - Carotid dopplers with no significant stenosis  - 2D echo unremarkable with EF 60-65%  - For TEE.  Tobacco abuse:  - Nicotine patch as tolerated  - Also on chantix  Nausea/Vomiting:  - STAT CT as above to r/o acute change  - Serial cardiac enzymes to r/o nstemi  - EKG unremarkable - NSR w/o st changes  - Question medication induced? - reports nausea when taking chantix with ASA together today and when she started chantix  Code Status: Full Family Communication: Pt in room (indicate person spoken with, relationship, and if by phone, the number) Disposition Plan: Pending  Consultants:  Neurology  Cardiology  Procedures: 2D echo - 05/18/13  Carotid dopplers 05/18/13  HPI/Subjective: No acute events noted overnight  Objective: Filed Vitals:   05/21/13 1050 05/21/13 1056 05/21/13 1100 05/21/13 1355  BP: 118/72 124/78 104/65 116/65  Pulse:   59 65  Temp:   98 F (36.7 C) 97.3 F (36.3 C)  TempSrc:   Oral Oral  Resp:  22 22 20   Height:      Weight:      SpO2:  97% 99% 100%    Intake/Output Summary (Last 24 hours) at 05/21/13 1658 Last data filed at 05/21/13 1052  Gross per 24 hour  Intake    490 ml  Output      0 ml  Net    490 ml   Filed Weights   05/18/13 0645  Weight: 86.183 kg (190 lb)    Exam:   General:  Awake, in nad  Cardiovascular: regular, s1, s2  Respiratory: normal resp effort no audible wheezing  Abdomen: soft, nondistended  Musculoskeletal: perfused, no clubbing   Data Reviewed: Basic Metabolic Panel:  Recent Labs Lab 05/18/13 0456 05/18/13 0512 05/18/13 1000 05/19/13 0540  NA 140 143  --  141  K 2.7* 3.0*  --  3.7  CL 104 106  --  109  CO2 25  --   --  25  GLUCOSE 149* 143*  --  96   BUN 12 12  --  14  CREATININE 0.96 1.00 0.93 0.86  CALCIUM 8.8  --   --  8.2*   Liver Function Tests:  Recent Labs Lab 05/18/13 0456  AST 13  ALT 7  ALKPHOS 61  BILITOT 0.2*  PROT 7.5  ALBUMIN 3.6   No results found for this basename: LIPASE, AMYLASE,  in the last 168 hours No results found for this basename: AMMONIA,  in the last 168 hours CBC:  Recent Labs Lab 05/18/13 0456 05/18/13 0512 05/18/13 1000  WBC 7.7  --  7.2  NEUTROABS 3.5  --   --   HGB 9.7* 11.6* 9.3*  HCT 31.1* 34.0* 29.7*  MCV 68.1*  --  68.0*  PLT 273  --  259   Cardiac Enzymes:  Recent Labs Lab 05/20/13 1109 05/20/13 1645 05/20/13 2351 05/21/13 0540 05/21/13 1120  TROPONINI <0.30 <0.30 <0.30 <0.30 <0.30   BNP (last 3 results) No results found for this basename: PROBNP,  in the last 8760 hours CBG:  Recent Labs Lab 05/18/13 2307 05/20/13 2152 05/21/13 0648 05/21/13 1143 05/21/13 1633  GLUCAP 109* 95 89 87 113*    No results found for this or  any previous visit (from the past 240 hour(s)).   Studies: No results found.  Scheduled Meds: . aspirin  325 mg Oral Daily  . atorvastatin  20 mg Oral q1800  . gabapentin  100 mg Oral TID  . heparin  5,000 Units Subcutaneous Q8H  . nicotine  21 mg Transdermal Daily  . varenicline  0.5 mg Oral Daily   Continuous Infusions: . sodium chloride 75 mL/hr at 05/21/13 0411  . sodium chloride      Principal Problem:   CVA (cerebral infarction) Active Problems:   Hypokalemia   Impaired glucose metabolism   Elevated blood pressure   Tobacco use disorder    Time spent: 25    CHIU, STEPHEN K  [facility] Pager [phone]. If 7PM-7AM, please contact night-coverage at [email or web address], password [employer] [facility] 05/21/2013, 4:58 PM  LOS: 3 days

## 2013-05-21 NOTE — Progress Notes (Signed)
Occupational Therapy Evaluation and Discharge Patient Details Name: [name] MRN: [id number] DOB: Jun 02, 1971 Today's Date: 05/21/2013 Time: 4696-2952 OT Time Calculation (min): 23 min  OT Assessment / Plan / Recommendation History of present illness Pt is a 42 yo F who was at work and experienced L sided weakness and numbness. 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 Pt back to baseline, and even though she stated that her LLE was causing her pain was able to perform sink level ADL, tub transfer, and toileting/peri care at modified independent level. Pt does not need continued OT services. OT to sign off.     OT Assessment  Patient does not need any further OT services    Follow Up Recommendations  No OT follow up                Precautions / Restrictions Precautions Precautions: Fall Restrictions Weight Bearing Restrictions: No   Pertinent Vitals/Pain Pt complaining of pain that "felt like a muscle ache" in her LLE. Pt repositioned to chair.    ADL  Eating/Feeding: NPO Grooming: Wash/dry hands;Wash/dry face;Teeth care;Independent Where Assessed - Grooming: Supported standing (UE on sink surface) Upper Body Bathing: Modified independent Where Assessed - Upper Body Bathing: Supported standing Lower Body Bathing: Modified independent Where Assessed - Lower Body Bathing: Supported standing Upper Body Dressing: Independent Where Assessed - Upper Body Dressing: Unsupported sitting Lower Body Dressing: Independent Where Assessed - Lower Body Dressing: Unsupported sitting Toilet Transfer: Modified independent Toilet Transfer Method: Sit to Barista: Comfort height toilet Toileting - Clothing Manipulation and Hygiene: Independent Where Assessed - Engineer, mining and Hygiene: Standing Tub/Shower Transfer: Modified  independent Tub/Shower Transfer Method: Ambulating Equipment Used: Gait belt Transfers/Ambulation Related to ADLs: Pt independent in transfers and ambulation ADL Comments: Pt modified independent with sink level ADL, toileting and peri care, tub transfer. Pt able to close eyes and stand for 10 sec with no LOB, and spin 360degrees with no LOB.     OT Goals(Current goals can be found in the care plan section)    Visit Information  Last OT Received On: 05/21/13 Assistance Needed: +1 History of Present Illness: Pt is a 41 yo F who was at work and experienced L sided weakness and numbness. 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       Prior Functioning     Home Living Family/patient expects to be discharged to:: Private residence Living Arrangements: Children (63 and 29 year old boys) Available Help at Discharge: Friend(s);Family;Available PRN/intermittently Type of Home: House Home Access: Stairs to enter [employer] of Steps: 5 Entrance Stairs-Rails: Right Home Layout: One level Home Equipment: None Additional Comments: Has 5 year old son too who lives near by Prior Function Level of Independence: Independent Comments: working, driving, and independent in household chores/cooking PTA Communication Communication: No difficulties Dominant Hand: Right         Vision/Perception Vision - History Baseline Vision: No visual deficits Patient Visual Report: No change from baseline   Cognition  Cognition Arousal/Alertness: Awake/alert Behavior During Therapy: WFL for tasks assessed/performed Overall Cognitive Status: Within Functional Limits for tasks assessed    Extremity/Trunk Assessment Upper Extremity Assessment Upper Extremity Assessment: Overall WFL for tasks assessed Lower Extremity Assessment Lower Extremity Assessment: Overall WFL for tasks assessed Cervical / Trunk Assessment Cervical / Trunk Assessment: Normal  Mobility Bed  Mobility Bed Mobility: Supine to Sit;Sitting - Scoot to Edge of Bed Supine to Sit: 6: Modified independent (Device/Increase time) Sitting - Scoot to Edge of Bed: 6: Modified independent (Device/Increase time) Details for Bed Mobility Assistance: Pt modified independent for bed mobility Transfers Transfers: Sit to Stand;Stand to Sit Sit to Stand: 7: Independent Stand to Sit: 7: Independent           End of Session OT - End of Session Equipment Utilized During Treatment: Gait belt Activity Tolerance: Patient tolerated treatment well Patient left: in chair;with call bell/phone within reach;with family/visitor present Nurse Communication: Mobility status  GO     Sherryl Manges 05/21/2013, 9:57 AM

## 2013-05-21 NOTE — Progress Notes (Signed)
I agree with the following treatment note after reviewing documentation.   Johnston, Jarl Sellitto Brynn   OTR/L Pager: [phone] Office: [phone] .   

## 2013-05-21 NOTE — Interval H&P Note (Signed)
History and Physical Interval Note:  05/21/2013 10:10 AM  [name]  has presented today for surgery, with the diagnosis of stroke  The various methods of treatment have been discussed with the patient and family. After consideration of risks, benefits and other options for treatment, the patient has consented to  Procedure(s): TRANSESOPHAGEAL ECHOCARDIOGRAM (TEE) (N/A) as a surgical intervention .  The patient's history has been reviewed, patient examined, no change in status, stable for surgery.  I have reviewed the patient's chart and labs.  Questions were answered to the patient's satisfaction.     TURNER,TRACI R

## 2013-05-21 NOTE — Progress Notes (Signed)
Stroke Team Progress Note  HISTORY [name] is a 42 y.o. female with no known medical disease and on no prescription medications presenting with acute onset of numbness involving left upper extremity and left side of the face. Patient was last known well at 0445 05/18/2013. She has not been on antiplatelet therapy. There is no previous history of stroke. She had symptoms of numbness involving her left distal upper extremity the day prior to admission . CT scan of her head unremarkable except for an area of nonspecific low density involving right temporal lobe. Significance is unclear. NIH stroke score was 0. Patient still had minimal residual sensory changes involving left face and distal left upper extremity.   LSN: 4:45 AM on 05/18/2013  tPA Given: No: Rapidly resolving deficits  MRankin: 0   SUBJECTIVE No complaints. Would like to go home.  OBJECTIVE Most recent Vital Signs: Filed Vitals:   05/21/13 1040 05/21/13 1050 05/21/13 1056 05/21/13 1100  BP: 118/69 118/72 124/78 104/65  Pulse: 71   59  Temp: 98.1 F (36.7 C)   98 F (36.7 C)  TempSrc: Oral   Oral  Resp: 24  22 22   Height:      Weight:      SpO2: 97%  97% 99%   CBG (last 3)   Recent Labs  05/20/13 2152 05/21/13 0648 05/21/13 1143  GLUCAP 95 89 87    IV Fluid Intake:   . sodium chloride 75 mL/hr at 05/21/13 0411  . sodium chloride      MEDICATIONS  . aspirin  325 mg Oral Daily  . atorvastatin  20 mg Oral q1800  . heparin  5,000 Units Subcutaneous Q8H  . nicotine  21 mg Transdermal Daily  . pneumococcal 23 valent vaccine  0.5 mL Intramuscular Tomorrow-1000  . varenicline  0.5 mg Oral Daily   PRN:  acetaminophen, ondansetron, zolpidem  Diet:  Cardiac thin liquids Activity:  Bathroom privileges with assistance DVT Prophylaxis:  Subcutaneous heparin  CLINICALLY SIGNIFICANT STUDIES Basic Metabolic Panel:   Recent Labs Lab 05/18/13 0456 05/18/13 0512 05/18/13 1000 05/19/13 0540  NA 140 143  --   141  K 2.7* 3.0*  --  3.7  CL 104 106  --  109  CO2 25  --   --  25  GLUCOSE 149* 143*  --  96  BUN 12 12  --  14  CREATININE 0.96 1.00 0.93 0.86  CALCIUM 8.8  --   --  8.2*   Liver Function Tests:   Recent Labs Lab 05/18/13 0456  AST 13  ALT 7  ALKPHOS 61  BILITOT 0.2*  PROT 7.5  ALBUMIN 3.6   CBC:   Recent Labs Lab 05/18/13 0456 05/18/13 0512 05/18/13 1000  WBC 7.7  --  7.2  NEUTROABS 3.5  --   --   HGB 9.7* 11.6* 9.3*  HCT 31.1* 34.0* 29.7*  MCV 68.1*  --  68.0*  PLT 273  --  259   Coagulation:   Recent Labs Lab 05/18/13 0456  LABPROT 13.0  INR 1.00   Cardiac Enzymes:   Recent Labs Lab 05/20/13 2351 05/21/13 0540 05/21/13 1120  TROPONINI <0.30 <0.30 <0.30   Urinalysis: No results found for this basename: COLORURINE, APPERANCEUR, LABSPEC, PHURINE, GLUCOSEU, HGBUR, BILIRUBINUR, KETONESUR, PROTEINUR, UROBILINOGEN, NITRITE, LEUKOCYTESUR,  in the last 168 hours Lipid Panel    Component Value Date/Time   CHOL 202* 05/19/2013 0540   TRIG 118 05/19/2013 0540   HDL 51 05/19/2013  0540   CHOLHDL 4.0 05/19/2013 0540   VLDL 24 05/19/2013 0540   LDLCALC 127* 05/19/2013 0540   HgbA1C  Lab Results  Component Value Date   HGBA1C 5.8* 05/18/2013    Urine Drug Screen:     Component Value Date/Time   LABOPIA NONE DETECTED 05/18/2013 2045   COCAINSCRNUR NONE DETECTED 05/18/2013 2045   LABBENZ NONE DETECTED 05/18/2013 2045   AMPHETMU NONE DETECTED 05/18/2013 2045   THCU NONE DETECTED 05/18/2013 2045   LABBARB NONE DETECTED 05/18/2013 2045    Alcohol Level: No results found for this basename: ETH,  in the last 168 hours  Ct Head (brain) Wo Contrast 05/18/2013  Nonspecific low attenuation in the right temporal lobe could represent early changes of acute infarct.  No evidence of acute intracranial hemorrhage.    Mri Brain Without Contrast 05/18/2013  Scattered areas of acute infarction in the right middle cerebral artery territory consistent with embolic disease.  No  swelling or hemorrhage.    MRA HEAD   Normal intracranial MR angiography of the large and medium-sized vessels.    2D Echocardiogram  ejection fraction 60-65%. No cardiac source of emboli identified.  Carotid Doppler  No significant extracranial carotid artery stenosis demonstrated. Vertebrals are patent with antegrade flow.   TEE no source of embolus  CXR    EKG normal sinus rhythm rate 66 beats per minute  Therapy Recommendations no needs Filed Vitals:   05/21/13 1355  BP: 116/65  Pulse: 65  Temp: 97.3 F (36.3 C)  Resp: 20    Physical Exam Awake alert. Afebrile. Head is nontraumatic. Neck is supple without bruit. Hearing is normal. Cardiac exam no murmur or gallop. Lungs are clear to auscultation. Distal pulses are well felt. NEUROLOGIC:    MENTAL STATUS: awake, alert, oriented, language fluent,  follows simple commands.  CRANIAL NERVES: pupils equal and reactive to light,extraocular muscles intact, facial sensation and slight left facial droop, uvula midline MOTOR: normal bulk and tone, Strength - SENSORY: normal and symmetric to light touch  COORDINATION: finger-nose-finger and heel to shin are symmetric.    ASSESSMENT [name] is a 42 y.o. female presenting with left hemisensory deficits. TPA was not given secondary to rapidly resolving symptoms. Imaging confirms scattered areas of acute infarction in the right middle cerebral artery territory consistent with embolic disease. Infarct felt to be embolic secondary to unknown etiology. TEE done today unrevealing.  On no antithrombotics prior to admission. Now on aspirin 325 mg orally every day for secondary stroke prevention. Patient with resultant . Work up underway.   Hx migraines and cigarette smoker. These 2 together have a 3-4 times increased risk of stroke  Waxing and waning of symptoms  Hypokalemia  Hypertension  Hyperlipidemia - cholesterol 202 - LDL 127. On statin  Anemia   Hospital day #  3  TREATMENT/PLAN  Continue aspirin 325 mg orally every day for secondary stroke prevention.  Modify risk factors: continue Statin, stop smoking  No therapy needs  Complete rest of Hypercoagulable state work up and vasulitic panel, HIV and RPR as possible sources of embolic stroke. F/u results  Ok for discharge from neuro standpoint Please schedule an outpatient TCD bubble study with emboli monitoring with Dr. Pearlean Brownie in 1 month to further evaluate for possible PFO. Have patient call for appointment. (this has been added to discharge instruction sheet).  Annie Main, MSN, RN, ANVP-BC, ANP-BC, [facility] Pager: [phone] 05/21/2013 12:40 PM  I have personally obtained  a history, examined the patient, evaluated imaging results, and formulated the assessment and plan of care. I agree with the above.  Antony Contras, MD

## 2013-05-21 NOTE — H&P (View-Only) (Signed)
Admit date: 05/18/2013 Referring Physician  Dr. Willis Primary Cardiologist  None Reason for Consultation  CVA needs TEE  HPI: [name] is a 41 y.o. female with no known medical disease and on no prescription medications presenting with acute onset of numbness involving left upper extremity and left side of the face. Patient was last known well at 0445 05/18/2013. She has not been on antiplatelet therapy. There is no previous history of stroke. She had symptoms of numbness involving her left distal upper extremity the day prior to admission . CT scan of her head unremarkable except for an area of nonspecific low density involving right temporal lobe. Significance is unclear. NIH stroke score was 0. Patient still had minimal residual sensory changes involving left face and distal left upper extremity. Imaging revealed scattered areas of acute infarction in the right MCA territory c/w embolic disease.  Cardiology is now asked to consult for TEE to evaluate for cardiac source of embolism.  2D echo was normal.        PMH:  History reviewed. No pertinent past medical history.   PSH:   Past Surgical History  Procedure Laterality Date  . Cesarean section      x 1 with 5th pregnancy  . Cholecystectomy      Allergies:  Review of patient's allergies indicates no known allergies. Prior to Admit Meds:   No prescriptions prior to admission   Fam HX:    Family History  Problem Relation Age of Onset  . Renal Disease Father     dialysis  . Hypertension Father   . Heart disease Mother     stabbed in heart   Social HX:    History   Social History  . Marital Status: Divorced    Spouse Name: N/A    Number of Children: N/A  . Years of Education: N/A   Occupational History  . Not on file.   Social History Main Topics  . Smoking status: Current Every Day Smoker -- 0.50 packs/day for 28 years  . Smokeless tobacco: Never Used     Comment: tried nothing for cessation  . Alcohol Use: Yes      Comment: occasional  . Drug Use: No  . Sexually Active: Not on file   Other Topics Concern  . Not on file   Social History Narrative  . No narrative on file     ROS:  All 11 ROS were addressed and are negative except what is stated in the HPI  Physical Exam: Blood pressure 110/81, pulse 63, temperature 97.9 F (36.6 C), temperature source Oral, resp. rate 18, height 5' 2" (1.575 m), weight 86.183 kg (190 lb), last menstrual period 04/17/2013, SpO2 100.00%.    General: Well developed, well nourished, in no acute distress Head: Eyes PERRLA, No xanthomas.   Normal cephalic and atramatic  Lungs:   Clear bilaterally to auscultation and percussion. Heart:   HRRR S1 S2 Pulses are 2+ & equal.            No carotid bruit. No JVD.  No abdominal bruits. No femoral bruits. Abdomen: Bowel sounds are positive, abdomen soft and non-tender without masses Extremities:   No clubbing, cyanosis or edema.  DP +1 Neuro: Alert and oriented X 3. Psych:  Good affect, responds appropriately    Labs:   Lab Results  Component Value Date   WBC 7.2 05/18/2013   HGB 9.3* 05/18/2013   HCT 29.7* 05/18/2013   MCV 68.0* 05/18/2013     PLT 259 05/18/2013    Recent Labs Lab 05/18/13 0456  05/19/13 0540  NA 140  < > 141  K 2.7*  < > 3.7  CL 104  < > 109  CO2 25  --  25  BUN 12  < > 14  CREATININE 0.96  < > 0.86  CALCIUM 8.8  --  8.2*  PROT 7.5  --   --   BILITOT 0.2*  --   --   ALKPHOS 61  --   --   ALT 7  --   --   AST 13  --   --   GLUCOSE 149*  < > 96  < > = values in this interval not displayed. No results found for this basename: PTT   Lab Results  Component Value Date   INR 1.00 05/18/2013   Lab Results  Component Value Date   TROPONINI <0.30 05/19/2013     Lab Results  Component Value Date   CHOL 202* 05/19/2013   Lab Results  Component Value Date   HDL 51 05/19/2013   Lab Results  Component Value Date   LDLCALC 127* 05/19/2013   Lab Results  Component Value Date   TRIG  118 05/19/2013   Lab Results  Component Value Date   CHOLHDL 4.0 05/19/2013   No results found for this basename: LDLDIRECT      Radiology:  Ct Head Wo Contrast  05/19/2013   *RADIOLOGY REPORT*  Clinical Data: CT head left arm and face numbness and weakness. Rule out CVA  CT HEAD WITHOUT CONTRAST  Comparison:  MRI 05/18/2013  Findings:  There is no evidence of fracture or dislocation.  There is no evidence of arthropathy or other focal bone abnormality. Soft tissues are unremarkable. Small area of acute infarct in the right frontal parietal lobe on the recent MRI is not seen on the current CT.  IMPRESSION: Negative.   Original Report Authenticated By: David Clark, M.D.   Ct Head (brain) Wo Contrast  05/18/2013   *RADIOLOGY REPORT*  Clinical Data: Left arm weakness onset of 04:00 p.m. yesterday afternoon.  CT HEAD WITHOUT CONTRAST  Technique:  Contiguous axial images were obtained from the base of the skull through the vertex without contrast.  Comparison: 10/10/2009  Findings: Cavum septum pellucidum.  Suggestion of vague low attenuation change in the right temporal lobe which could represent early changes of acute infarct.  No mass effect or midline shift. No abnormal extra-axial fluid collections.  Gray-white matter junctions are distinct.  Basal cisterns are not effaced.  No sulci effacement.  No acute intracranial hemorrhage.  No depressed skull fractures.  Visualized portions of the paranasal sinuses and mastoid air cells are not opacified.  IMPRESSION: Nonspecific low attenuation in the right temporal lobe could represent early changes of acute infarct.  No evidence of acute intracranial hemorrhage.  Code stroke results were telephoned to Dr. Opitz at 0518 hours on 05/18/2013.   Original Report Authenticated By: William Stevens, M.D.   Mri Brain Without Contrast  05/18/2013   *RADIOLOGY REPORT*  Clinical Data:  Numbness of the left arm.  Possible stroke.  MRI HEAD WITHOUT CONTRAST MRA HEAD  WITHOUT CONTRAST  Technique:  Multiplanar, multiecho pulse sequences of the brain and surrounding structures were obtained without intravenous contrast. Angiographic images of the head were obtained using MRA technique without contrast.  Comparison:  Head CT same day.  MRI HEAD  Findings:  There are areas of acute infarction affecting the right   to parietal cortical and subcortical brain in several locations consistent with embolic disease.  The largest single area measures about to 8 mm x 20 mm.  No swelling or hemorrhage.  The remainder of the brain appears normal without evidence of old ischemic insult.  No mass lesion, hydrocephalus or extra-axial collection. No pituitary mass.  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  No inflammatory sinus disease.  IMPRESSION: Scattered areas of acute infarction in the right middle cerebral artery territory consistent with embolic disease.  No swelling or hemorrhage.  MRA HEAD  Findings: Both internal carotid arteries are widely patent into the brain.  The anterior and middle cerebral vessels are patent without proximal stenosis, aneurysm or vascular malformation.  No missing MCA branches are detected on the right.  Both vertebral arteries are patent to the basilar with the right being dominant.  No basilar stenosis.  Posterior circulation branch vessels are normal.  IMPRESSION: Normal intracranial MR angiography of the large and medium-sized vessels.   Original Report Authenticated By: Mark Shogry, M.D.   Mr Mra Head/brain Wo Cm  05/18/2013   *RADIOLOGY REPORT*  Clinical Data:  Numbness of the left arm.  Possible stroke.  MRI HEAD WITHOUT CONTRAST MRA HEAD WITHOUT CONTRAST  Technique:  Multiplanar, multiecho pulse sequences of the brain and surrounding structures were obtained without intravenous contrast. Angiographic images of the head were obtained using MRA technique without contrast.  Comparison:  Head CT same day.  MRI HEAD  Findings:  There are areas of acute  infarction affecting the right to parietal cortical and subcortical brain in several locations consistent with embolic disease.  The largest single area measures about to 8 mm x 20 mm.  No swelling or hemorrhage.  The remainder of the brain appears normal without evidence of old ischemic insult.  No mass lesion, hydrocephalus or extra-axial collection. No pituitary mass.  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  No inflammatory sinus disease.  IMPRESSION: Scattered areas of acute infarction in the right middle cerebral artery territory consistent with embolic disease.  No swelling or hemorrhage.  MRA HEAD  Findings: Both internal carotid arteries are widely patent into the brain.  The anterior and middle cerebral vessels are patent without proximal stenosis, aneurysm or vascular malformation.  No missing MCA branches are detected on the right.  Both vertebral arteries are patent to the basilar with the right being dominant.  No basilar stenosis.  Posterior circulation branch vessels are normal.  IMPRESSION: Normal intracranial MR angiography of the large and medium-sized vessels.   Original Report Authenticated By: Mark Shogry, M.D.    EKG:  NSR with very small lateral Q waves that are most likely nondiagnostic  ASSESSMENT:  1. 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  Plan TEE on Monday unless patient is discharged and if discharged will plan outpt TEE next week.  Please call [phone] if patient is going to be discharged over the weekend.  Arien Morine R, MD  05/19/2013  3:38 PM    

## 2013-05-21 NOTE — Progress Notes (Signed)
  Echocardiogram Echocardiogram Transesophageal has been performed.  Jorje Guild 05/21/2013, 10:47 AM

## 2013-05-21 NOTE — CV Procedure (Signed)
PROCEDURE NOTE  Procedure:  Transesophageal echocardiogram with agitated saline contrast injection Operator:  Armanda Magic, MD Indications:  CVA IV Medications: Versed 4mg  and Fentanyl   Complications: None  Results: Normal LV size and function Normal RV size and function Normal RA with prominent eustachian valve of no clinical significance Normal TV with mild TR Normal MV with mild MR Normal trileaflet AV Normal PV Normal LA and LA appendage No evidence of intracardiac shunt by colorflow doppler or agiatated saline contrast injection Patient tolerated procedure well and was transferred back to her room in stable condition.

## 2013-05-21 NOTE — Progress Notes (Signed)
Upon assessment patient c/o "traveling numbness from my fingers to my head." Pt states it last "only for a few mintues, and doesn't hurt." Neuro's remain intact. On call physician notified. Will continue to monitor. Herma Ard RN

## 2013-05-22 ENCOUNTER — Encounter (HOSPITAL_COMMUNITY): Payer: Self-pay | Admitting: Cardiology

## 2013-05-22 LAB — T.PALLIDUM AB, IGG: T pallidum Antibodies (TP-PA): 0.13 S/CO (ref <0.90)

## 2013-05-22 LAB — PROTEIN C ACTIVITY: Protein C Activity: 171 % — ABNORMAL HIGH (ref 75–133)

## 2013-05-22 LAB — PROTHROMBIN GENE MUTATION

## 2013-05-23 LAB — PROTEIN C, TOTAL: Protein C, Total: 90 % (ref 72–160)

## 2013-05-23 LAB — COMPLEMENT, TOTAL: Compl, Total (CH50): 55 U/mL (ref 31–60)

## 2013-05-25 ENCOUNTER — Ambulatory Visit: Payer: Self-pay

## 2013-05-28 ENCOUNTER — Ambulatory Visit: Payer: Medicaid Other | Attending: Family Medicine | Admitting: Internal Medicine

## 2013-05-28 ENCOUNTER — Encounter: Payer: Self-pay | Admitting: *Deleted

## 2013-05-28 VITALS — BP 114/80 | HR 64 | Temp 98.5°F | Resp 16 | Ht 64.0 in | Wt 195.0 lb

## 2013-05-28 DIAGNOSIS — I6350 Cerebral infarction due to unspecified occlusion or stenosis of unspecified cerebral artery: Secondary | ICD-10-CM | POA: Insufficient documentation

## 2013-05-28 DIAGNOSIS — F17200 Nicotine dependence, unspecified, uncomplicated: Secondary | ICD-10-CM | POA: Insufficient documentation

## 2013-05-28 DIAGNOSIS — I639 Cerebral infarction, unspecified: Secondary | ICD-10-CM

## 2013-05-28 DIAGNOSIS — Z79899 Other long term (current) drug therapy: Secondary | ICD-10-CM | POA: Insufficient documentation

## 2013-05-28 DIAGNOSIS — Z7982 Long term (current) use of aspirin: Secondary | ICD-10-CM | POA: Insufficient documentation

## 2013-05-28 MED ORDER — NICOTINE 21 MG/24HR TD PT24: 1.0000 | MEDICATED_PATCH | Freq: Every day | TRANSDERMAL | Status: DC

## 2013-05-28 NOTE — Progress Notes (Signed)
Patient presents for hospital follow for TIA. Was released from hospital las Monday. States some numbness on left face and left arm but has improved with use of gabapentin. States she is trying to quit smoking but can't afford Rx.

## 2013-05-28 NOTE — Progress Notes (Signed)
Patient ID: [name], female   DOB: September 30, 1971, 42 y.o.   MRN: [id number]  CC:  HPI: 42 year old female is here for followup of her stroke, she works as a Lawyer, she was found to have an acute infarct in the right MCA territory started on full dose aspirin. Apparently she had a 2-D echo that should any of of 60-65% carotid Dopplers were unremarkable, TEE that did not show any thrombus, neurology did recommend to schedule the patient for a bubble study. Patient states that she does not have a followup appointment with neurology. Will advise her office to set this up in one month.   No Known Allergies Past Medical History  Diagnosis Date  . Stroke 05/2013   Current Outpatient Prescriptions on File Prior to Visit  Medication Sig Dispense Refill  . aspirin 325 MG tablet Take 1 tablet (325 mg total) by mouth daily.      Marland Kitchen atorvastatin (LIPITOR) 20 MG tablet Take 1 tablet (20 mg total) by mouth daily at 6 PM.  30 tablet  0  . gabapentin (NEURONTIN) 100 MG capsule Take 1 capsule (100 mg total) by mouth 3 (three) times daily.  90 capsule  0  . varenicline (CHANTIX) 0.5 MG tablet Take 1 tablet (0.5 mg total) by mouth daily.  30 tablet  0  . zolpidem (AMBIEN) 5 MG tablet Take 1 tablet (5 mg total) by mouth at bedtime as needed for sleep.  30 tablet  0   No current facility-administered medications on file prior to visit.   Family History  Problem Relation Age of Onset  . Renal Disease Father     dialysis  . Hypertension Father   . Heart disease Mother     stabbed in heart   History   Social History  . Marital Status: Divorced    Spouse Name: N/A    Number of Children: N/A  . Years of Education: N/A   Occupational History  . Not on file.   Social History Main Topics  . Smoking status: Current Every Day Smoker -- 0.50 packs/day for 28 years  . Smokeless tobacco: Never Used     Comment: tried nothing for cessation  . Alcohol Use: Yes     Comment: occasional  . Drug Use: No  .  Sexually Active: Not on file   Other Topics Concern  . Not on file   Social History Narrative  . No narrative on file    Review of Systems  Constitutional: Negative for fever, chills, diaphoresis, activity change, appetite change and fatigue.  HENT: Negative for ear pain, nosebleeds, congestion, facial swelling, rhinorrhea, neck pain, neck stiffness and ear discharge.   Eyes: Negative for pain, discharge, redness, itching and visual disturbance.  Respiratory: Negative for cough, choking, chest tightness, shortness of breath, wheezing and stridor.   Cardiovascular: Negative for chest pain, palpitations and leg swelling.  Gastrointestinal: Negative for abdominal distention.  Genitourinary: Negative for dysuria, urgency, frequency, hematuria, flank pain, decreased urine volume, difficulty urinating and dyspareunia.  Musculoskeletal: Negative for back pain, joint swelling, arthralgias and gait problem.  Neurological: Negative for dizziness, tremors, seizures, syncope, facial asymmetry, speech difficulty, weakness, light-headedness, numbness and headaches.  Hematological: Negative for adenopathy. Does not bruise/bleed easily.  Psychiatric/Behavioral: Negative for hallucinations, behavioral problems, confusion, dysphoric mood, decreased concentration and agitation.    Objective:   Filed Vitals:   05/28/13 1731  BP: 114/80  Pulse: 64  Temp: 98.5 F (36.9 C)  Resp: 16  Physical Exam  Constitutional: Appears well-developed and well-nourished. No distress.  HENT: Normocephalic. External right and left ear normal. Oropharynx is clear and moist.  Eyes: Conjunctivae and EOM are normal. PERRLA, no scleral icterus.  Neck: Normal ROM. Neck supple. No JVD. No tracheal deviation. No thyromegaly.  CVS: RRR, S1/S2 +, no murmurs, no gallops, no carotid bruit.  Pulmonary: Effort and breath sounds normal, no stridor, rhonchi, wheezes, rales.  Abdominal: Soft. BS +,  no distension, tenderness,  rebound or guarding.  Musculoskeletal: Normal range of motion. No edema and no tenderness.  Lymphadenopathy: No lymphadenopathy noted, cervical, inguinal. Neuro: Alert. Normal reflexes, muscle tone coordination. No cranial nerve deficit. Skin: Skin is warm and dry. No rash noted. Not diaphoretic. No erythema. No pallor.  Psychiatric: Normal mood and affect. Behavior, judgment, thought content normal.   Lab Results  Component Value Date   WBC 7.2 05/18/2013   HGB 9.3* 05/18/2013   HCT 29.7* 05/18/2013   MCV 68.0* 05/18/2013   PLT 259 05/18/2013   Lab Results  Component Value Date   CREATININE 0.86 05/19/2013   BUN 14 05/19/2013   NA 141 05/19/2013   K 3.7 05/19/2013   CL 109 05/19/2013   CO2 25 05/19/2013    Lab Results  Component Value Date   HGBA1C 5.8* 05/18/2013   Lipid Panel     Component Value Date/Time   CHOL 202* 05/19/2013 0540   TRIG 118 05/19/2013 0540   HDL 51 05/19/2013 0540   CHOLHDL 4.0 05/19/2013 0540   VLDL 24 05/19/2013 0540   LDLCALC 127* 05/19/2013 0540       Assessment and plan:   Patient Active Problem List   Diagnosis Date Noted  . CVA (cerebral infarction) 05/18/2013  . Hypokalemia 05/18/2013  . Impaired glucose metabolism 05/18/2013  . Elevated blood pressure 05/18/2013  . Tobacco use disorder 05/18/2013       CVA Patient advised to continue with aspirin Smoking cessation done again Bubble study to be scheduled Followup with neurology in one month   Smoking cessation, but per patient request the patient has been provided with a prescription for a nicotine patch

## 2013-06-01 ENCOUNTER — Ambulatory Visit: Payer: Self-pay

## 2013-06-05 ENCOUNTER — Other Ambulatory Visit: Payer: Self-pay | Admitting: Neurology

## 2013-06-05 DIAGNOSIS — I6350 Cerebral infarction due to unspecified occlusion or stenosis of unspecified cerebral artery: Secondary | ICD-10-CM

## 2013-06-08 ENCOUNTER — Ambulatory Visit: Payer: Self-pay

## 2013-06-18 ENCOUNTER — Ambulatory Visit: Payer: Self-pay | Admitting: Neurology

## 2013-08-23 ENCOUNTER — Encounter (HOSPITAL_COMMUNITY): Payer: Self-pay | Admitting: Emergency Medicine

## 2013-08-23 ENCOUNTER — Inpatient Hospital Stay (HOSPITAL_COMMUNITY)
Admission: EM | Admit: 2013-08-23 | Discharge: 2013-08-24 | DRG: 066 | Disposition: A | Discharge status: Home / Self Care | Payer: Medicaid Other | Attending: Family Medicine | Admitting: Family Medicine

## 2013-08-23 ENCOUNTER — Emergency Department (HOSPITAL_COMMUNITY): Payer: Medicaid Other

## 2013-08-23 DIAGNOSIS — I639 Cerebral infarction, unspecified: Secondary | ICD-10-CM

## 2013-08-23 DIAGNOSIS — E785 Hyperlipidemia, unspecified: Secondary | ICD-10-CM | POA: Diagnosis present

## 2013-08-23 DIAGNOSIS — Z87891 Personal history of nicotine dependence: Secondary | ICD-10-CM

## 2013-08-23 DIAGNOSIS — Z8673 Personal history of transient ischemic attack (TIA), and cerebral infarction without residual deficits: Secondary | ICD-10-CM

## 2013-08-23 DIAGNOSIS — Z79899 Other long term (current) drug therapy: Secondary | ICD-10-CM

## 2013-08-23 DIAGNOSIS — G459 Transient cerebral ischemic attack, unspecified: Secondary | ICD-10-CM

## 2013-08-23 DIAGNOSIS — E876 Hypokalemia: Secondary | ICD-10-CM | POA: Diagnosis present

## 2013-08-23 DIAGNOSIS — F17201 Nicotine dependence, unspecified, in remission: Secondary | ICD-10-CM | POA: Diagnosis present

## 2013-08-23 DIAGNOSIS — D509 Iron deficiency anemia, unspecified: Secondary | ICD-10-CM | POA: Diagnosis present

## 2013-08-23 DIAGNOSIS — R404 Transient alteration of awareness: Secondary | ICD-10-CM | POA: Diagnosis present

## 2013-08-23 DIAGNOSIS — I6350 Cerebral infarction due to unspecified occlusion or stenosis of unspecified cerebral artery: Principal | ICD-10-CM | POA: Diagnosis present

## 2013-08-23 DIAGNOSIS — Z7982 Long term (current) use of aspirin: Secondary | ICD-10-CM

## 2013-08-23 LAB — POCT I-STAT, CHEM 8
BUN: 9 mg/dL (ref 6–23)
Calcium, Ion: 1.12 mmol/L (ref 1.12–1.23)
Creatinine, Ser: 1.1 mg/dL (ref 0.50–1.10)
Glucose, Bld: 107 mg/dL — ABNORMAL HIGH (ref 70–99)
HCT: 34 % — ABNORMAL LOW (ref 36.0–46.0)
Hemoglobin: 11.6 g/dL — ABNORMAL LOW (ref 12.0–15.0)
Sodium: 140 mEq/L (ref 135–145)
TCO2: 22 mmol/L (ref 0–100)

## 2013-08-23 LAB — URINALYSIS, ROUTINE W REFLEX MICROSCOPIC
Bilirubin Urine: NEGATIVE
Glucose, UA: NEGATIVE mg/dL
Hgb urine dipstick: NEGATIVE
Ketones, ur: NEGATIVE mg/dL
Protein, ur: NEGATIVE mg/dL
Specific Gravity, Urine: 1.012 (ref 1.005–1.030)
Urobilinogen, UA: 1 mg/dL (ref 0.0–1.0)

## 2013-08-23 LAB — CBC
HCT: 30.4 % — ABNORMAL LOW (ref 36.0–46.0)
Hemoglobin: 9.3 g/dL — ABNORMAL LOW (ref 12.0–15.0)
MCH: 20.3 pg — ABNORMAL LOW (ref 26.0–34.0)
MCHC: 30.6 g/dL (ref 30.0–36.0)
MCV: 66.4 fL — ABNORMAL LOW (ref 78.0–100.0)
Platelets: 297 10*3/uL (ref 150–400)
RBC: 4.58 MIL/uL (ref 3.87–5.11)
RDW: 20.1 % — ABNORMAL HIGH (ref 11.5–15.5)
WBC: 5.3 10*3/uL (ref 4.0–10.5)

## 2013-08-23 LAB — PROTIME-INR
INR: 0.96 (ref 0.00–1.49)
Prothrombin Time: 12.6 seconds (ref 11.6–15.2)

## 2013-08-23 LAB — DIFFERENTIAL
Basophils Absolute: 0 10*3/uL (ref 0.0–0.1)
Basophils Relative: 0 % (ref 0–1)
Eosinophils Absolute: 0.2 10*3/uL (ref 0.0–0.7)
Lymphocytes Relative: 43 % (ref 12–46)
Monocytes Absolute: 0.5 10*3/uL (ref 0.1–1.0)
Monocytes Relative: 9 % (ref 3–12)
Neutro Abs: 2.3 10*3/uL (ref 1.7–7.7)

## 2013-08-23 LAB — ETHANOL: Alcohol, Ethyl (B): <11 mg/dL (ref 0–11)

## 2013-08-23 LAB — COMPREHENSIVE METABOLIC PANEL
AST: 18 U/L (ref 0–37)
Albumin: 3.6 g/dL (ref 3.5–5.2)
BUN: 10 mg/dL (ref 6–23)
Chloride: 104 mEq/L (ref 96–112)
Creatinine, Ser: 0.82 mg/dL (ref 0.50–1.10)
Potassium: 3.5 mEq/L (ref 3.5–5.1)
Total Bilirubin: 0.2 mg/dL — ABNORMAL LOW (ref 0.3–1.2)
Total Protein: 7.6 g/dL (ref 6.0–8.3)

## 2013-08-23 LAB — RAPID URINE DRUG SCREEN, HOSP PERFORMED
Amphetamines: NOT DETECTED
Barbiturates: NOT DETECTED
Benzodiazepines: NOT DETECTED
Cocaine: NOT DETECTED
Opiates: NOT DETECTED
Tetrahydrocannabinol: NOT DETECTED

## 2013-08-23 LAB — TROPONIN I: Troponin I: <0.3 ng/mL (ref <0.30)

## 2013-08-23 LAB — POCT I-STAT TROPONIN I

## 2013-08-23 LAB — APTT: aPTT: 27 seconds (ref 24–37)

## 2013-08-23 NOTE — ED Notes (Addendum)
presents with sudden onset of Right arm numbness began at 19:30, pt is neurologically intact, no drift, no droop, equal grips, no slurred speech. Right arm continues to be numb.  Dr. Hyacinth Meeker at bedside screening patient.

## 2013-08-23 NOTE — Progress Notes (Signed)
Adm. For TIA monitoring and wu

## 2013-08-23 NOTE — Significant Event (Signed)
Rapid Response Event Note  Overview: Time Called: 2143 Arrival Time: 2247 Event Type: Neurologic  Initial Focused Assessment: Code Stroke LSW 1945. intermitten R arm numbness/tingling. S/P stroke 05/2013. NIHSS - 0 at this time   Interventions: CT, CBG - 97, BP 120/67   Event Summary: not candidate for TPA, admit for TIA wu Name of Physician Notified: Dr. Amada Jupiter at      at          [place] [place]

## 2013-08-23 NOTE — ED Provider Notes (Signed)
CSN: [id number]     Arrival date & time 08/23/13  2124 History   First MD Initiated Contact with Patient 08/23/13 2145     Chief Complaint  Patient presents with  . Code Stroke   (Consider location/radiation/quality/duration/timing/severity/associated sxs/prior Treatment) HPI Comments: 42 year old female, history of a stroke that occurred in May of this year presents with a complaint of right upper extremity numbness. 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 It has been persistent, waxing and waning and on arrival still has symptoms though they are mild. She had no difficulty using her legs, no changes in vision or speech, no facial droop, no difficulty ambulating or problems with coordination or strength. In addition she had no difficulty with strength of the right upper extremity. Her last known while time was at 7:45 PM  The history is provided by the patient and medical records.    Past Medical History  Diagnosis Date  . Stroke 05/2013   Past Surgical History  Procedure Laterality Date  . Cesarean section      x 1 with 5th pregnancy  . Cholecystectomy    . Tee without cardioversion N/A 05/21/2013    Procedure: TRANSESOPHAGEAL ECHOCARDIOGRAM (TEE);  Surgeon: Quintella Reichert, MD;  Location: [facility] ENDOSCOPY;  Service: Cardiovascular;  Laterality: N/A;   Family History  Problem Relation Age of Onset  . Renal Disease Father     dialysis  . Hypertension Father   . Heart disease Mother     stabbed in heart   History  Substance Use Topics  . Smoking status: Current Every Day Smoker -- 0.50 packs/day for 28 years  . Smokeless tobacco: Never Used     Comment: tried nothing for cessation  . Alcohol Use: Yes     Comment: occasional   OB History   Grav Para Term Preterm Abortions TAB SAB Ect Mult  Living                 Review of Systems  All other systems reviewed and are negative.    Allergies  Review of patient's allergies indicates no known allergies.  Home Medications   Current Outpatient Rx  Name  Route  Sig  Dispense  Refill  . aspirin 325 MG tablet   Oral   Take 1 tablet (325 mg total) by mouth daily.         Marland Kitchen atorvastatin (LIPITOR) 20 MG tablet   Oral   Take 1 tablet (20 mg total) by mouth daily at 6 PM.   30 tablet   0   . gabapentin (NEURONTIN) 100 MG capsule   Oral   Take 1 capsule (100 mg total) by mouth 3 (three) times daily.   90 capsule   0   . varenicline (CHANTIX) 0.5 MG tablet   Oral   Take 1 tablet (0.5 mg total) by mouth daily.   30 tablet   0   . zolpidem (AMBIEN) 5 MG tablet   Oral   Take 1 tablet (5 mg total) by mouth at bedtime as needed for sleep.   30 tablet   0    BP 113/75  Pulse 63  Temp(Src) 99 F (37.2 C) (Oral)  Resp 20  Wt 205 lb 3 oz (93.072 kg)  BMI 35.2 kg/m2  SpO2  100%  LMP 08/23/2013 Physical Exam  Nursing note and vitals reviewed. Constitutional: She appears well-developed and well-nourished. No distress.  HENT:  Head: Normocephalic and atraumatic.  Mouth/Throat: Oropharynx is clear and moist. No oropharyngeal exudate.  Eyes: Conjunctivae and EOM are normal. Pupils are equal, round, and reactive to light. Right eye exhibits no discharge. Left eye exhibits no discharge. No scleral icterus.  Neck: Normal range of motion. Neck supple. No JVD present. No thyromegaly present.  Cardiovascular: Normal rate, regular rhythm, normal heart sounds and intact distal pulses.  Exam reveals no gallop and no friction rub.   No murmur heard. Pulmonary/Chest: Effort normal and breath sounds normal. No respiratory distress. She has no wheezes. She has no rales.  Abdominal: Soft. Bowel sounds are normal. She exhibits no distension and no mass. There is no tenderness.  Musculoskeletal: Normal range of motion. She exhibits  no edema and no tenderness.  Lymphadenopathy:    She has no cervical adenopathy.  Neurological: She is alert. Coordination normal.  Speech is clear, cranial nerves III through XII are intact, memory is intact, strength is normal in all 4 extremities including grips, sensation is intact to light touch and pinprick in all 4 extremities except as above. Coordination as tested by finger-nose-finger is normal, no limb ataxia. Normal gait, normal reflexes at the patellar tendons bilaterally  There is a sensory deficit to the right upper extremity, to light touch, pinprick is normal  Skin: Skin is warm and dry. No rash noted. No erythema.  Psychiatric: She has a normal mood and affect. Her behavior is normal.    ED Course  Procedures (including critical care time) Labs Review Labs Reviewed  CBC - Abnormal; Notable for the following:    Hemoglobin 9.3 (*)    HCT 30.4 (*)    MCV 66.4 (*)    MCH 20.3 (*)    RDW 20.1 (*)    All other components within normal limits  COMPREHENSIVE METABOLIC PANEL - Abnormal; Notable for the following:    Glucose, Bld 105 (*)    Calcium 8.1 (*)    Total Bilirubin 0.2 (*)    GFR calc non Af Amer 86 (*)    All other components within normal limits  POCT I-STAT, CHEM 8 - Abnormal; Notable for the following:    Potassium 3.4 (*)    Glucose, Bld 107 (*)    Hemoglobin 11.6 (*)    HCT 34.0 (*)    All other components within normal limits  ETHANOL  PROTIME-INR  APTT  DIFFERENTIAL  TROPONIN I  URINALYSIS, ROUTINE W REFLEX MICROSCOPIC  URINE RAPID DRUG SCREEN (HOSP PERFORMED)  POCT I-STAT TROPONIN I   Imaging Review Ct Head Wo Contrast  08/23/2013   CLINICAL DATA:  Code stroke. Right arm numbness. Headache.  EXAM: CT HEAD WITHOUT CONTRAST  TECHNIQUE: Contiguous axial images were obtained from the base of the skull through the vertex without contrast.  COMPARISON:  05/18/2013 CT and MR.  FINDINGS: Normal appearance of the intracranial structures. No evidence  for acute hemorrhage, mass lesion, midline shift, hydrocephalus or large infarct. No acute bony abnormality. The visualized sinuses are clear. No change from priors.  IMPRESSION: No acute intracranial abnormality. No significant encephalomalacia is seen as a result of previous MRI documented right hemispheric ischemia.  Critical Value/emergent results were called by telephone at the time of interpretation on 08/23/2013 at 9:56 PM to [facility] , who verbally acknowledged these results.   Electronically Signed   By: Jonny Ruiz  Curnes M.D.   On: 08/23/2013 21:57    EKG Interpretation   None     Sinus rhythm Minimal ST depression, inferior leads Baseline wander in lead(s) II III aVF normal axis. no sig changes since last.  MDM   1. TIA (transient ischemic attack)    Care was discussed with Dr. Amada Jupiter of neurology service who agrees that the patient has worrisome symptoms and as the MRI from May showed that she did have acute stroke he is also concerned that this could be embolic phenomenon as well. She is not in atrial fibrillation on clinical exam, EKG to follow, labs ordered, CT scan is negative for acute infarction. The neurologist recommends echocardiogram but no other imaging would be necessary given the patient's recent workup. We'll discuss with the hospitalist after labs returned. The patient has essentially resolved her symptoms and the neurologist recommends against thrombolytic therapy at this time as she has made such a rapid improvement.  D/w Dr. Amada Jupiter, will admit to hospitalist  D/w Dr. Toniann Fail who agrees with observation admit.  Vida Roller, MD 08/23/13 [phone]

## 2013-08-23 NOTE — Progress Notes (Signed)
Code Stroke - intermitten R arm numbness/ tingling

## 2013-08-23 NOTE — Consult Note (Signed)
Neurology Consultation Reason for Consult: Right-sided numbness Referring Physician: Hyacinth Meeker, the  CC: Right-sided numbness  History is obtained from: Patient  HPI: [name] is a 42 y.o. female   with a history of previous stroke in July of this year who presents with several episodes of transient right arm numbness lasting several minutes in duration. She states it first happened around 7:30 PM, but that she return to baseline. Subsequently around 9 PM, she had the same right arm numbness and became confused with that episode as well.   That episode prompted her to seek treatment here. Symptoms of subsequently completely resolved and she feels like she is completely back to her baseline.  LKW: Currently returned to baseline tpa given: no, symptoms improve    ROS: A 14 point ROS was performed and is negative except as noted in the HPI.  Past Medical History  Diagnosis Date  . Stroke 05/2013    Family History: MI in grandfather No history of clotting disorders as far as the patient knows.  Social History: Tob: Quit smoking 2 weeks ago  Exam: Current vital signs: BP 120/67  Pulse 66  Temp(Src) 98.6 F (37 C) (Oral)  Resp 24  Wt 93.072 kg (205 lb 3 oz)  BMI 35.2 kg/m2  SpO2 100%  LMP 08/23/2013 Vital signs in last 24 hours: Temp:  [98.6 F (37 C)] 98.6 F (37 C) (10/16 2207) Pulse Rate:  [66-83] 66 (10/16 2207) Resp:  [18-24] 24 (10/16 2207) BP: (120-121)/(67-85) 120/67 mmHg (10/16 2207) SpO2:  [100 %] 100 % (10/16 2207) Weight:  [93.072 kg (205 lb 3 oz)] 93.072 kg (205 lb 3 oz) (10/16 2136)  General: In bed, NAD CV: Regular rate and rhythm Mental Status: Patient is awake, alert, oriented to person, place, month, year, and situation. Immediate and remote memory are intact. Patient is able to give a clear and coherent history. No signs of aphasia or neglect Cranial Nerves: II: Visual Fields are full. Pupils are equal, round, and reactive to light.  Discs  are difficult to visualize. III,IV, VI: EOMI without ptosis or diploplia.  V: Facial sensation is symmetric to temperature VII: Facial movement is symmetric.  VIII: hearing is intact to voice X: Uvula elevates symmetrically XI: Shoulder shrug is symmetric. XII: tongue is midline without atrophy or fasciculations.  Motor: Tone is normal. Bulk is normal. 5/5 strength was present in all four extremities.  Sensory: Sensation is symmetric to light touch and temperature in the arms and legs. Deep Tendon Reflexes: 2+ and symmetric in the biceps and patellae.  Plantars: Toes are downgoing bilaterally.  Cerebellar: FNF and HKS are intact bilaterally Gait: Not assessed due to acute nature of evaluation and multiple medical monitors in ED setting.    I have reviewed labs in epic and the results pertinent to this consultation are: microcytic anemia  I have reviewed the images obtained: CT head-no acute changes MRI brain from previous hospitalization: Scattered small right MCA territory infarctions  Impression: 42 year old female with previous likely embolic stroke now with recurrent right-sided numbness. Possibilities include TIA, and though it is possible that she has small area of ischemia with her resolved symptoms I feel that I would call it TIA at this time.  I do not think she needs a complete repetition of her previous stroke workup, however I would favor repeating her echo given the likely embolic nature of her strokes and now contralateral involvement.  Recommendations: 1) lipid panel 2) echocardiogram 3) MRI brain/MRA head  4) no PT/OT needed since she is back to baseline and would not need rehabilitation 5) telemetry 6) every 2 hour neuro checks. 7) continue ASA at this time for secondary stroke prevention, may consider changing to Plavix prior to discharge.   Ritta Slot, MD [facility] [phone]  If 7pm- 7am, please page neurology on call at  [phone].

## 2013-08-23 NOTE — ED Notes (Signed)
Code Stroke Times: LSN: 1945, Pt. Arrived by POV: 2124, EDP exam: 2134, Code Stroke called: 2143, Pt arrived in CT: 2145, Stroke team and Neurologist arrived: 2149, CT read by neurologist: 2149, Phlebotomy arrival: 2150, Pt. Roomed in [place] A: 2151.

## 2013-08-23 NOTE — ED Notes (Signed)
Pt. C/o intermittent right arm numbness beginning at 1945 this evening. Pt. Denies at this time. Hx of stroke.

## 2013-08-23 NOTE — ED Notes (Signed)
Code stroke canceled. TIA protocol started.

## 2013-08-24 ENCOUNTER — Encounter (HOSPITAL_COMMUNITY): Payer: Self-pay | Admitting: Internal Medicine

## 2013-08-24 ENCOUNTER — Observation Stay (HOSPITAL_COMMUNITY): Payer: Medicaid Other

## 2013-08-24 DIAGNOSIS — I6350 Cerebral infarction due to unspecified occlusion or stenosis of unspecified cerebral artery: Principal | ICD-10-CM

## 2013-08-24 DIAGNOSIS — E785 Hyperlipidemia, unspecified: Secondary | ICD-10-CM | POA: Diagnosis present

## 2013-08-24 DIAGNOSIS — I059 Rheumatic mitral valve disease, unspecified: Secondary | ICD-10-CM

## 2013-08-24 LAB — COMPREHENSIVE METABOLIC PANEL
ALT: 8 U/L (ref 0–35)
AST: 16 U/L (ref 0–37)
Albumin: 3.2 g/dL — ABNORMAL LOW (ref 3.5–5.2)
BUN: 9 mg/dL (ref 6–23)
Calcium: 8.4 mg/dL (ref 8.4–10.5)
Chloride: 104 mEq/L (ref 96–112)
Creatinine, Ser: 0.8 mg/dL (ref 0.50–1.10)
Sodium: 138 mEq/L (ref 135–145)
Total Protein: 6.9 g/dL (ref 6.0–8.3)

## 2013-08-24 LAB — CBC WITH DIFFERENTIAL/PLATELET
Basophils Relative: 1 % (ref 0–1)
Eosinophils Relative: 4 % (ref 0–5)
HCT: 28.5 % — ABNORMAL LOW (ref 36.0–46.0)
Hemoglobin: 8.6 g/dL — ABNORMAL LOW (ref 12.0–15.0)
Lymphs Abs: 2.3 10*3/uL (ref 0.7–4.0)
MCH: 19.9 pg — ABNORMAL LOW (ref 26.0–34.0)
MCV: 66 fL — ABNORMAL LOW (ref 78.0–100.0)
Monocytes Absolute: 0.4 10*3/uL (ref 0.1–1.0)
Monocytes Relative: 9 % (ref 3–12)
RBC: 4.32 MIL/uL (ref 3.87–5.11)
RDW: 20 % — ABNORMAL HIGH (ref 11.5–15.5)
WBC: 4.4 10*3/uL (ref 4.0–10.5)

## 2013-08-24 LAB — LIPID PANEL
LDL Cholesterol: 118 mg/dL — ABNORMAL HIGH (ref 0–99)
Total CHOL/HDL Ratio: 3 RATIO
VLDL: 17 mg/dL (ref 0–40)

## 2013-08-24 MED ORDER — ENOXAPARIN SODIUM 40 MG/0.4ML ~~LOC~~ SOLN
40.0000 mg | Freq: Every day | SUBCUTANEOUS | Status: DC
Start: 2013-08-24 — End: 2013-08-24
  Administered 2013-08-24: 40 mg via SUBCUTANEOUS
  Filled 2013-08-24: qty 0.4

## 2013-08-24 MED ORDER — CLOPIDOGREL BISULFATE 75 MG PO TABS: 75.0000 mg | ORAL_TABLET | Freq: Every day | ORAL | Status: DC

## 2013-08-24 MED ORDER — ZOLPIDEM TARTRATE 5 MG PO TABS
5.0000 mg | ORAL_TABLET | Freq: Every evening | ORAL | Status: DC | PRN
  Administered 2013-08-24: 5 mg via ORAL
  Filled 2013-08-24: qty 1

## 2013-08-24 MED ORDER — ACETAMINOPHEN 325 MG PO TABS
650.0000 mg | ORAL_TABLET | ORAL | Status: DC | PRN
  Administered 2013-08-24 (×3): 650 mg via ORAL
  Filled 2013-08-24 (×3): qty 2

## 2013-08-24 MED ORDER — ONDANSETRON HCL 4 MG/2ML IJ SOLN: 4.0000 mg | Freq: Three times a day (TID) | INTRAMUSCULAR | Status: DC | PRN

## 2013-08-24 MED ORDER — POTASSIUM CHLORIDE CRYS ER 20 MEQ PO TBCR
20.0000 meq | EXTENDED_RELEASE_TABLET | Freq: Once | ORAL | Status: AC
  Administered 2013-08-24: 20 meq via ORAL
  Filled 2013-08-24: qty 1

## 2013-08-24 MED ORDER — GABAPENTIN 100 MG PO CAPS
100.0000 mg | ORAL_CAPSULE | Freq: Three times a day (TID) | ORAL | Status: DC
  Administered 2013-08-24 (×3): 100 mg via ORAL
  Filled 2013-08-24 (×5): qty 1

## 2013-08-24 MED ORDER — SODIUM CHLORIDE 0.9 % IV SOLN
INTRAVENOUS | Status: DC
  Administered 2013-08-24: 02:00:00 via INTRAVENOUS

## 2013-08-24 MED ORDER — ATORVASTATIN CALCIUM 40 MG PO TABS: 40.0000 mg | ORAL_TABLET | Freq: Every day | ORAL | Status: DC

## 2013-08-24 MED ORDER — ASPIRIN 325 MG PO TABS
325.0000 mg | ORAL_TABLET | Freq: Every day | ORAL | Status: DC
  Administered 2013-08-24: 325 mg via ORAL
  Filled 2013-08-24: qty 1

## 2013-08-24 MED ORDER — ATORVASTATIN CALCIUM 40 MG PO TABS
40.0000 mg | ORAL_TABLET | Freq: Every day | ORAL | Status: DC
  Administered 2013-08-24: 40 mg via ORAL
  Filled 2013-08-24: qty 1

## 2013-08-24 MED ORDER — VARENICLINE TARTRATE 0.5 MG PO TABS
0.5000 mg | ORAL_TABLET | Freq: Every day | ORAL | Status: DC
  Administered 2013-08-24: 0.5 mg via ORAL
  Filled 2013-08-24: qty 1

## 2013-08-24 MED ORDER — ASPIRIN 325 MG PO TABS: 325.0000 mg | ORAL_TABLET | Freq: Every day | ORAL | Status: DC

## 2013-08-24 MED ORDER — ATORVASTATIN CALCIUM 20 MG PO TABS
20.0000 mg | ORAL_TABLET | Freq: Every day | ORAL | Status: DC
Start: 2013-08-24 — End: 2013-08-24
  Filled 2013-08-24: qty 1

## 2013-08-24 NOTE — Progress Notes (Deleted)
Patient is being discharged from room 4N10 at this time. Alert and in stable condition. Dsg to loop placement site on left upper chest intact and dry. Discharge instructions read to patient and daughter and verbalized understanding. IV d/c'd and also tele.Left unit via wheelchair with all belongings.      

## 2013-08-24 NOTE — Progress Notes (Signed)
Patient is being discharged from room 4N07 at this time. IV site d/c'd and also tele. Instructions read to patient and she verbalized understanding. Left unit alert and in stable condition with belongings.

## 2013-08-24 NOTE — H&P (Signed)
[facility] History and Physical  KAWTHAR ENNEN ZOX:[id number] DOB: November 14, 1970 DOA: 08/23/2013  Referring physician: ER physician. PCP: Standley Dakins, MD   Chief Complaint: Right upper extremity numbness.  HPI: [name] is a 42 y.o. female was brought to the ER after patient was complaining of right upper extremity numbness. Patient's symptoms started around 7:45 PM. Symptoms was waxing and waning. By the time patient reached ER patient's symptoms are completely resolved. Patient was brought as a code stroke and was seen by neurologist. Since patient's symptoms are completely resolved code stroke was canceled. CTA did not show any acute and patient has been elevated for further management. Patient had stroke in July of this year and had extensive workup including TEE. Patient denies any weakness of the left upper and both lower extremities. And right upper the only numbness was present and no weakness. Denies any blurred vision difficulty speaking or swallowing. Denies any chest pain or shortness of breath.   Review of Systems: As presented in the history of presenting illness, rest negative.  Past Medical History  Diagnosis Date  . Stroke 05/2013   Past Surgical History  Procedure Laterality Date  . Cesarean section      x 1 with 5th pregnancy  . Cholecystectomy    . Tee without cardioversion N/A 05/21/2013    Procedure: TRANSESOPHAGEAL ECHOCARDIOGRAM (TEE);  Surgeon: Quintella Reichert, MD;  Location: [facility] ENDOSCOPY;  Service: Cardiovascular;  Laterality: N/A;   Social History:  reports that she has been smoking.  She has never used smokeless tobacco. She reports that she drinks alcohol. She reports that she does not use illicit drugs. Where does patient live home. Can patient participate in ADLs? Yes.  No Known Allergies  Family History:  Family History  Problem Relation Age of Onset  . Renal Disease Father     dialysis  . Hypertension Father   . Heart disease  Mother     stabbed in heart      Prior to Admission medications   Medication Sig Start Date End Date Taking? Authorizing Provider  aspirin 325 MG tablet Take 1 tablet (325 mg total) by mouth daily. 05/20/13  Yes Jerald Kief, MD  atorvastatin (LIPITOR) 20 MG tablet Take 1 tablet (20 mg total) by mouth daily at 6 PM. 05/20/13  Yes Jerald Kief, MD  gabapentin (NEURONTIN) 100 MG capsule Take 1 capsule (100 mg total) by mouth 3 (three) times daily. 05/21/13  Yes Jerald Kief, MD  varenicline (CHANTIX) 0.5 MG tablet Take 1 tablet (0.5 mg total) by mouth daily. 05/20/13   Jerald Kief, MD  zolpidem (AMBIEN) 5 MG tablet Take 1 tablet (5 mg total) by mouth at bedtime as needed for sleep. 05/21/13   Jerald Kief, MD    Physical Exam: Filed Vitals:   08/23/13 2245 08/23/13 2300 08/23/13 2307 08/23/13 2345  BP: 137/81 113/75  117/81  Pulse: 60 63  65  Temp:   99 F (37.2 C)   TempSrc:      Resp: 20 20  16   Weight:      SpO2: 100% 100%  98%     General:  Well-developed well-nourished.  Eyes: Anicteric no pallor.  ENT: No discharge from ears eyes nose mouth.  Neck: No mass felt.  Cardiovascular: S1-S2 heard.  Respiratory: No rhonchi or crepitations.  Abdomen: Soft nontender bowel sounds present.  Skin: No rash.  Musculoskeletal: No edema.  Psychiatric: Appears normal.  Neurologic: Alert awake  oriented to time place and person. Moves all extremities 5 x 5. No facial asymmetry. No tongue deviation. PERRLA positive.  Labs on Admission:  Basic Metabolic Panel:  Recent Labs Lab 08/23/13 2201 08/23/13 2202  NA 138 140  K 3.5 3.4*  CL 104 104  CO2 23  --   GLUCOSE 105* 107*  BUN 10 9  CREATININE 0.82 1.10  CALCIUM 8.1*  --    Liver Function Tests:  Recent Labs Lab 08/23/13 2201  AST 18  ALT 8  ALKPHOS 66  BILITOT 0.2*  PROT 7.6  ALBUMIN 3.6   No results found for this basename: LIPASE, AMYLASE,  in the last 168 hours No results found for this basename:  AMMONIA,  in the last 168 hours CBC:  Recent Labs Lab 08/23/13 2201 08/23/13 2202  WBC 5.3  --   NEUTROABS 2.3  --   HGB 9.3* 11.6*  HCT 30.4* 34.0*  MCV 66.4*  --   PLT 297  --    Cardiac Enzymes:  Recent Labs Lab 08/23/13 2201  TROPONINI <0.30    BNP (last 3 results) No results found for this basename: PROBNP,  in the last 8760 hours CBG: No results found for this basename: GLUCAP,  in the last 168 hours  Radiological Exams on Admission: Ct Head Wo Contrast  08/23/2013   CLINICAL DATA:  Code stroke. Right arm numbness. Headache.  EXAM: CT HEAD WITHOUT CONTRAST  TECHNIQUE: Contiguous axial images were obtained from the base of the skull through the vertex without contrast.  COMPARISON:  05/18/2013 CT and MR.  FINDINGS: Normal appearance of the intracranial structures. No evidence for acute hemorrhage, mass lesion, midline shift, hydrocephalus or large infarct. No acute bony abnormality. The visualized sinuses are clear. No change from priors.  IMPRESSION: No acute intracranial abnormality. No significant encephalomalacia is seen as a result of previous MRI documented right hemispheric ischemia.  Critical Value/emergent results were called by telephone at the time of interpretation on 08/23/2013 at 9:56 PM to [facility] , who verbally acknowledged these results.   Electronically Signed   By: Davonna Belling M.D.   On: 08/23/2013 21:57     Assessment/Plan Principal Problem:   TIA (transient ischemic attack)   1. Possible TIA - neurologist has already consulted and recommended stroke workup. MRI/MRA brain 2-D echo and carotid Dopplers have been ordered. Telemetry shows sinus rhythm. 2. Hyperlipidemia - continue statins. Patient states she had missed her statins for last few days. 3. History of cigarette smoking - quit last few months.    Code Status: Full code.  Family Communication: None.  Disposition Plan: Admit for observation.    Osher Oettinger N. [facility] Pager [phone].  If 7PM-7AM, please contact night-coverage [email or web address] Password [email or web address] 08/24/2013, 12:19 AM

## 2013-08-24 NOTE — Progress Notes (Signed)
Stroke Team Progress Note  HISTORY [name] is a 42 y.o. female with a history of previous stroke in July of this year who presents with several episodes of transient right arm numbness lasting several minutes in duration. She states it first happened around 7:30 PM, but that she return to baseline. Subsequently around 9 PM, she had the same right arm numbness and became confused with that episode as well.   That episode prompted her to seek treatment here. Symptoms of subsequently completely resolved and she feels like she is completely back to her baseline.   LKW: Currently returned to baseline  tpa given: no, symptoms improve   She was admitted for further evaluation and treatment.  SUBJECTIVE She is lying in bed. States that she did have a migraine yesterday morning but her headache resolved. She never had numbness like this before.  Quit smoking 6 months ago. Not on birth control pills.  OBJECTIVE Most recent Vital Signs: Filed Vitals:   08/24/13 0050 08/24/13 0157 08/24/13 0549 08/24/13 0749  BP: 123/78 115/65 113/66 108/72  Pulse: 75 68  80  Temp: 98.4 F (36.9 C) 98.7 F (37.1 C) 98.2 F (36.8 C) 98.4 F (36.9 C)  TempSrc: Oral Oral Oral Oral  Resp: 17 18 17 18   Height: 5\' 3"  (1.6 m)     Weight: 91.264 kg (201 lb 3.2 oz)     SpO2: 99% 100% 100% 99%   CBG (last 3)  No results found for this basename: GLUCAP,  in the last 72 hours  IV Fluid Intake:   . sodium chloride 50 mL/hr at 08/24/13 0151    MEDICATIONS  . aspirin  325 mg Oral Daily  . atorvastatin  20 mg Oral q1800  . enoxaparin (LOVENOX) injection  40 mg Subcutaneous Daily  . gabapentin  100 mg Oral TID  . varenicline  0.5 mg Oral Daily   PRN:  acetaminophen, zolpidem  Diet:  Cardiac thin liquids Activity:  Bathroom privileges with assistance DVT Prophylaxis:  lovenox  CLINICALLY SIGNIFICANT STUDIES Basic Metabolic Panel:  Recent Labs Lab 08/23/13 2201 08/23/13 2202 08/24/13 0530  NA 138 140  138  K 3.5 3.4* 3.3*  CL 104 104 104  CO2 23  --  24  GLUCOSE 105* 107* 134*  BUN 10 9 9   CREATININE 0.82 1.10 0.80  CALCIUM 8.1*  --  8.4   Liver Function Tests:  Recent Labs Lab 08/23/13 2201 08/24/13 0530  AST 18 16  ALT 8 8  ALKPHOS 66 59  BILITOT 0.2* 0.2*  PROT 7.6 6.9  ALBUMIN 3.6 3.2*   CBC:  Recent Labs Lab 08/23/13 2201 08/23/13 2202 08/24/13 0530  WBC 5.3  --  4.4  NEUTROABS 2.3  --  1.5*  HGB 9.3* 11.6* 8.6*  HCT 30.4* 34.0* 28.5*  MCV 66.4*  --  66.0*  PLT 297  --  282   Coagulation:  Recent Labs Lab 08/23/13 2201  LABPROT 12.6  INR 0.96   Cardiac Enzymes:  Recent Labs Lab 08/23/13 2201  TROPONINI <0.30   Urinalysis:  Recent Labs Lab 08/23/13 2303  COLORURINE YELLOW  LABSPEC 1.012  PHURINE 5.5  GLUCOSEU NEGATIVE  HGBUR NEGATIVE  BILIRUBINUR NEGATIVE  KETONESUR NEGATIVE  PROTEINUR NEGATIVE  UROBILINOGEN 1.0  NITRITE NEGATIVE  LEUKOCYTESUR NEGATIVE   Lipid Panel    Component Value Date/Time   CHOL 201* 08/24/2013 0500   TRIG 83 08/24/2013 0500   HDL 66 08/24/2013 0500   CHOLHDL 3.0 08/24/2013  0500   VLDL 17 08/24/2013 0500   LDLCALC 118* 08/24/2013 0500   HgbA1C  Lab Results  Component Value Date   HGBA1C 5.8* 05/18/2013    Urine Drug Screen:     Component Value Date/Time   LABOPIA NONE DETECTED 08/23/2013 2303   COCAINSCRNUR NONE DETECTED 08/23/2013 2303   LABBENZ NONE DETECTED 08/23/2013 2303   AMPHETMU NONE DETECTED 08/23/2013 2303   THCU NONE DETECTED 08/23/2013 2303   LABBARB NONE DETECTED 08/23/2013 2303    Alcohol Level:  Recent Labs Lab 08/23/13 2201  ETH <11    Ct Head Wo Contrast 08/23/2013   No acute intracranial abnormality.  MRI of the brain   4 mm focus of acute infarct in the left high parietal lobe consistent with the patient's symptoms.  MRA of the brain  Negative  2D Echocardiogram    Carotid Doppler    CXR    EKG  normal sinus rhythm.   Therapy Recommendations   Physical Exam    General: In bed, NAD  CV: Regular rate and rhythm  Mental Status:  Patient is awake, alert, oriented to person, place, month, year, and situation.  Immediate and remote memory are intact.  Patient is able to give a clear and coherent history.  No signs of aphasia or neglect  Cranial Nerves:  II: Visual Fields are full. Pupils are equal, round, and reactive to light. Discs are difficult to visualize.  III,IV, VI: EOMI without ptosis or diploplia.  V: Facial sensation is symmetric to temperature  VII: Facial movement is symmetric.  VIII: hearing is intact to voice  X: Uvula elevates symmetrically  XI: Shoulder shrug is symmetric.  XII: tongue is midline without atrophy or fasciculations.  Motor:  Tone is normal. Bulk is normal. 5/5 strength was present in all four extremities.  Sensory:  Sensation is symmetric to light touch and temperature in the arms and legs.  Deep Tendon Reflexes:  2+ and symmetric in the biceps and patellae.  Plantars:  Toes are downgoing bilaterally.  Cerebellar:  FNF and HKS are intact bilaterally  Gait:  Not assessed due to acute nature of evaluation and multiple medical monitors in ED setting.    ASSESSMENT [name] is a 42 y.o. female presenting with transient right arm numbness lasting several minutes in duration which occurred several times; however she then had confusion with another episode. MR Imaging shows 4 mm focus of acute infarct in the left high parietal lobe. On aspirin 325 mg orally every day prior to admission. Now on aspirin 325 mg orally every day for secondary stroke prevention. Patient with resultant transient symptoms. Work up underway.   Hyperlipidemia, LDL 118, goal is < 100, increase statin from home dose.  HGB A1C  5.8  Former smoker, quit 2 weeks ago  Acute delirium, resolved  Hospital day # 1  TREATMENT/PLAN  Change to plavix 75mg  daily for secondary stroke prevention.  Echo pending. If no cardiac source of  embolic stroke, may be discharged from neurological standpoint.  Risk factor management  Physical therapy pending, symptoms resolved cancel. Please schedule an outpatient TCD bubble study with emboli monitoring with Dr. Pearlean Brownie in 1 month to further evaluate for possible PFO. Have patient call for appointment. I have placed instructions on discharge sheet. I have called cardiology to arrange outpatient telemetry monitoring to assess patient for atrial fibrillation as source of stroke. They will contact the patient.   Gwendolyn Lima. Manson Passey, PAC, MBA, MHA [facility] Pager: [phone] 08/24/2013 1:37 PM   I have personally obtained a history, examined the patient, evaluated imaging results, and formulated the assessment and plan of care. I agree with the above. Delia Heady, MD

## 2013-08-24 NOTE — Progress Notes (Signed)
Echocardiogram 2D Echocardiogram has been performed.  08/24/2013 12:00 PM Gertie Fey, RVT, RDCS, RDMS

## 2013-08-24 NOTE — Progress Notes (Signed)
The patient was seen and evaluated earlier this a.m. Please refer to my associates H&P for details regarding assessment and plan  No reassess next a.m. And followup with neurology's recommendations  [name], Pamala Hurry

## 2013-08-24 NOTE — Discharge Summary (Addendum)
Physician Discharge Summary  [name] [id number] DOB: 06/27/1971 DOA: 08/23/2013  PCP: Standley Dakins, MD  Admit date: 08/23/2013 Discharge date: 08/24/2013  Time spent: > 35  minutes  Recommendations for Outpatient Follow-up:  1. Please be sure to follow up with neurology in 1 month. Patient to set up appointment. Discussed with patient and she verbalizes her understanding.  Discharge Diagnoses:  Principal Problem:   TIA (transient ischemic attack)   Discharge Condition: stable  Diet recommendation: low sodium heart healthy  Filed Weights   08/23/13 2136 08/24/13 0050  Weight: 93.072 kg (205 lb 3 oz) 91.264 kg (201 lb 3.2 oz)    History of present illness:  Brief narrative from neurology's notes: [name] is a 42 y.o. female presenting with transient right arm numbness lasting several minutes in duration which occurred several times; however she then had confusion with another episode. MR Imaging shows 4 mm focus of acute infarct in the left high parietal lobe. On aspirin 325 mg orally every day prior to admission. Now on aspirin 325 mg orally every day for secondary stroke prevention. Patient with resultant transient symptoms.  Hospital Course:  1. Acute CVA. 4mm focus of acute infarct in the left high parietal lobe - Neurology on board and recommended the following: Change to plavix 75mg  daily for secondary stroke prevention.  Echo pending. If no cardiac source of embolic stroke, may be discharged from neurological standpoint.  Risk factor management  Physical therapy pending, symptoms resolved cancel. Please schedule an outpatient TCD bubble study with emboli monitoring with Dr. Pearlean Brownie in 1 month to further evaluate for possible PFO. Have patient call for appointment. I have placed instructions on discharge sheet.  I have called cardiology to arrange outpatient telemetry monitoring to assess patient for atrial fibrillation as source of stroke. They will  contact the patient.   2. HPL - Statin increased due to LDL of 118  3. Tobacco dependence - Recommended cessation  4. Hypokalemia - Will replace orally prior to discharge  Procedures:  Ct Head Wo Contrast  08/23/2013 No acute intracranial abnormality.  MRI of the brain  4 mm focus of acute infarct in the left high parietal lobe consistent with the patient's symptoms.  MRA of the brain Negative  2D Echocardiogram  Carotid Doppler  CXR  EKG normal sinus rhythm TTE: EF at 55-60 % With no reports of cardiac thrombus   Consultations:  Neurology  Discharge Exam: Filed Vitals:   08/24/13 1020  BP: 104/67  Pulse: 85  Temp: 98 F (36.7 C)  Resp: 18    General: Pt in NAD, Alert and Awake Cardiovascular: RRR, no MRG Respiratory: CTA BL, no wheezes  Discharge Instructions  Discharge Orders   Future Orders Complete By Expires   Call MD for:  extreme fatigue  As directed    Call MD for:  temperature >100.4  As directed    Diet - low sodium heart healthy  As directed    Increase activity slowly  As directed        Medication List    STOP taking these medications       aspirin 325 MG tablet      TAKE these medications       atorvastatin 40 MG tablet  Commonly known as:  LIPITOR  Take 1 tablet (40 mg total) by mouth daily at 6 PM.     clopidogrel 75 MG tablet  Commonly known as:  PLAVIX  Take 1 tablet (75  mg total) by mouth daily with breakfast.  Start taking on:  08/25/2013     gabapentin 100 MG capsule  Commonly known as:  NEURONTIN  Take 1 capsule (100 mg total) by mouth 3 (three) times daily.     varenicline 0.5 MG tablet  Commonly known as:  CHANTIX  Take 1 tablet (0.5 mg total) by mouth daily.     zolpidem 5 MG tablet  Commonly known as:  AMBIEN  Take 1 tablet (5 mg total) by mouth at bedtime as needed for sleep.       No Known Allergies     Follow-up Information   Follow up with Gates Rigg, MD In 1 month. (Please schedule an  outpatient TCD bubble study with emboli monitoring)    Specialties:  Neurology, Radiology   Contact information:   [address] [place] [address] [phone]        The results of significant diagnostics from this hospitalization (including imaging, microbiology, ancillary and laboratory) are listed below for reference.    Significant Diagnostic Studies: Ct Head Wo Contrast  08/23/2013   CLINICAL DATA:  Code stroke. Right arm numbness. Headache.  EXAM: CT HEAD WITHOUT CONTRAST  TECHNIQUE: Contiguous axial images were obtained from the base of the skull through the vertex without contrast.  COMPARISON:  05/18/2013 CT and MR.  FINDINGS: Normal appearance of the intracranial structures. No evidence for acute hemorrhage, mass lesion, midline shift, hydrocephalus or large infarct. No acute bony abnormality. The visualized sinuses are clear. No change from priors.  IMPRESSION: No acute intracranial abnormality. No significant encephalomalacia is seen as a result of previous MRI documented right hemispheric ischemia.  Critical Value/emergent results were called by telephone at the time of interpretation on 08/23/2013 at 9:56 PM to [facility] , who verbally acknowledged these results.   Electronically Signed   By: Davonna Belling M.D.   On: 08/23/2013 21:57   Mri Brain Without Contrast  08/24/2013   CLINICAL DATA:  Right arm numbness. TIA.  EXAM: MRI HEAD WITHOUT CONTRAST  MRA HEAD WITHOUT CONTRAST  TECHNIQUE: Multiplanar, multiecho pulse sequences of the brain and surrounding structures were obtained without intravenous contrast. Angiographic images of the head were obtained using MRA technique without contrast.  COMPARISON:  CT 08/23/2013. MRI 05/18/2013  FINDINGS: MRI HEAD FINDINGS  4 mm area of acute infarct in the high left parietal lobe in the post central gyrus. No other areas of acute infarct.  Chronic infarct in the right frontal parietal white matter. This area showed  restricted diffusion on 05/18/2013. No other areas of infarction. Brainstem and cerebellum are intact.  Negative for hemorrhage or mass. Ventricle size is normal and there is no midline shift.  MRA HEAD FINDINGS  Right vertebral artery is dominant. Both vertebral arteries contribute to the basilar. PICA is patent bilaterally. Right AICA is patent. Superior cerebellar and posterior cerebral arteries are patent without stenosis  Internal carotid artery widely patent bilaterally. Anterior and middle cerebral arteries are patent bilaterally. Negative for cerebral aneurysm.  IMPRESSION: MRI HEAD IMPRESSION  4 mm focus of acute infarct in the left high parietal lobe consistent with the patient's symptoms.  Chronic ischemia right parietal white matter.  MRA HEAD IMPRESSION  Negative   Electronically Signed   By: Marlan Palau M.D.   On: 08/24/2013 13:44   Mr Maxine Glenn Head/brain Wo Cm  08/24/2013   CLINICAL DATA:  Right arm numbness. TIA.  EXAM: MRI HEAD WITHOUT CONTRAST  MRA HEAD  WITHOUT CONTRAST  TECHNIQUE: Multiplanar, multiecho pulse sequences of the brain and surrounding structures were obtained without intravenous contrast. Angiographic images of the head were obtained using MRA technique without contrast.  COMPARISON:  CT 08/23/2013. MRI 05/18/2013  FINDINGS: MRI HEAD FINDINGS  4 mm area of acute infarct in the high left parietal lobe in the post central gyrus. No other areas of acute infarct.  Chronic infarct in the right frontal parietal white matter. This area showed restricted diffusion on 05/18/2013. No other areas of infarction. Brainstem and cerebellum are intact.  Negative for hemorrhage or mass. Ventricle size is normal and there is no midline shift.  MRA HEAD FINDINGS  Right vertebral artery is dominant. Both vertebral arteries contribute to the basilar. PICA is patent bilaterally. Right AICA is patent. Superior cerebellar and posterior cerebral arteries are patent without stenosis  Internal carotid artery  widely patent bilaterally. Anterior and middle cerebral arteries are patent bilaterally. Negative for cerebral aneurysm.  IMPRESSION: MRI HEAD IMPRESSION  4 mm focus of acute infarct in the left high parietal lobe consistent with the patient's symptoms.  Chronic ischemia right parietal white matter.  MRA HEAD IMPRESSION  Negative   Electronically Signed   By: Marlan Palau M.D.   On: 08/24/2013 13:44    Microbiology: No results found for this or any previous visit (from the past 240 hour(s)).   Labs: Basic Metabolic Panel:  Recent Labs Lab 08/23/13 2201 08/23/13 2202 08/24/13 0530  NA 138 140 138  K 3.5 3.4* 3.3*  CL 104 104 104  CO2 23  --  24  GLUCOSE 105* 107* 134*  BUN 10 9 9   CREATININE 0.82 1.10 0.80  CALCIUM 8.1*  --  8.4   Liver Function Tests:  Recent Labs Lab 08/23/13 2201 08/24/13 0530  AST 18 16  ALT 8 8  ALKPHOS 66 59  BILITOT 0.2* 0.2*  PROT 7.6 6.9  ALBUMIN 3.6 3.2*   No results found for this basename: LIPASE, AMYLASE,  in the last 168 hours No results found for this basename: AMMONIA,  in the last 168 hours CBC:  Recent Labs Lab 08/23/13 2201 08/23/13 2202 08/24/13 0530  WBC 5.3  --  4.4  NEUTROABS 2.3  --  1.5*  HGB 9.3* 11.6* 8.6*  HCT 30.4* 34.0* 28.5*  MCV 66.4*  --  66.0*  PLT 297  --  282   Cardiac Enzymes:  Recent Labs Lab 08/23/13 2201  TROPONINI <0.30   BNP: BNP (last 3 results) No results found for this basename: PROBNP,  in the last 8760 hours CBG:  Recent Labs Lab 08/23/13 2206  GLUCAP 97       Signed:  Penny Pia  [facility] 08/24/2013, 3:17 PM

## 2013-08-30 ENCOUNTER — Ambulatory Visit: Payer: Medicaid Other | Attending: Internal Medicine | Admitting: Internal Medicine

## 2013-08-30 ENCOUNTER — Encounter: Payer: Self-pay | Admitting: Internal Medicine

## 2013-08-30 VITALS — BP 126/86 | HR 77 | Temp 98.1°F | Ht 63.0 in | Wt 206.0 lb

## 2013-08-30 DIAGNOSIS — F409 Phobic anxiety disorder, unspecified: Secondary | ICD-10-CM | POA: Insufficient documentation

## 2013-08-30 DIAGNOSIS — I6350 Cerebral infarction due to unspecified occlusion or stenosis of unspecified cerebral artery: Secondary | ICD-10-CM

## 2013-08-30 DIAGNOSIS — G4700 Insomnia, unspecified: Secondary | ICD-10-CM | POA: Diagnosis present

## 2013-08-30 DIAGNOSIS — I639 Cerebral infarction, unspecified: Secondary | ICD-10-CM

## 2013-08-30 DIAGNOSIS — F489 Nonpsychotic mental disorder, unspecified: Secondary | ICD-10-CM

## 2013-08-30 MED ORDER — NICOTINE 14 MG/24HR TD PT24: 1.0000 | MEDICATED_PATCH | TRANSDERMAL | Status: DC

## 2013-08-30 MED ORDER — ZOLPIDEM TARTRATE 5 MG PO TABS: 5.0000 mg | ORAL_TABLET | Freq: Every evening | ORAL | Status: DC | PRN

## 2013-08-30 NOTE — Patient Instructions (Signed)
Stroke Prevention Some medical conditions and behaviors are associated with an increased chance of having a stroke. You may prevent a stroke by making healthy choices and managing medical conditions. Reduce your risk of having a stroke by:  Staying physically active. Get at least 30 minutes of activity on most or all days.  Not smoking. It may also be helpful to avoid exposure to secondhand smoke.  Limiting alcohol use. Moderate alcohol use is considered to be:  No more than 2 drinks per day for men.  No more than 1 drink per day for nonpregnant women.  Eating healthy foods.  Include 5 or more servings of fruits and vegetables a day.  Certain diets may be prescribed to address high blood pressure, high cholesterol, diabetes, or obesity.  Managing your cholesterol levels.  A low-saturated fat, low-trans fat, low-cholesterol, and high-fiber diet may control cholesterol levels.  Take any prescribed medicines to control cholesterol as directed by your caregiver.  Managing your diabetes.  A controlled-carbohydrate, controlled-sugar diet is recommended to manage diabetes.  Take any prescribed medicines to control diabetes as directed by your caregiver.  Controlling your high blood pressure (hypertension).  A low-salt (sodium), low-saturated fat, low-trans fat, and low-cholesterol diet is recommended to manage high blood pressure.  Take any prescribed medicines to control hypertension as directed by your caregiver.  Maintaining a healthy weight.  A reduced-calorie, low-sodium, low-saturated fat, low-trans fat, low-cholesterol diet is recommended to manage weight.  Stopping drug abuse.  Avoiding birth control pills.  Talk to your caregiver about the risks of taking birth control pills if you are over 58 years old, smoke, get migraines, or have ever had a blood clot.  Getting evaluated for sleep disorders (sleep apnea).  Talk to your caregiver about getting a sleep evaluation  if you snore a lot or have excessive sleepiness.  Taking medicines as directed by your caregiver.  For some people, aspirin or blood thinners (anticoagulants) are helpful in reducing the risk of forming abnormal blood clots that can lead to stroke. If you have the irregular heart rhythm of atrial fibrillation, you should be on a blood thinner unless there is a good reason you cannot take them.  Understand all your medicine instructions. SEEK IMMEDIATE MEDICAL CARE IF:   You have sudden weakness or numbness of the face, arm, or leg, especially on one side of the body.  You have sudden confusion.  You have trouble speaking (aphasia) or understanding.  You have sudden trouble seeing in one or both eyes.  You have sudden trouble walking.  You have dizziness.  You have a loss of balance or coordination.  You have a sudden, severe headache with no known cause.  You have new chest pain or an irregular heartbeat. Any of these symptoms may represent a serious problem that is an emergency. Do not wait to see if the symptoms will go away. Get medical help right away. Call your local emergency services (911 in [place] Do not drive yourself to the hospital. Document Released: 12/02/2004 Document Revised: 01/17/2012 Document Reviewed: 06/14/2011 [facility] Patient Information 2014 [place], [place]. Stroke (Cerebrovascular Accident) A stroke (cerebrovascular accident, CVA) means you have a brain injury from blocked circulation or bleeding in the brain. Blocked circulation usually comes from a clot. RISK FACTORS  High blood pressure (hypertension).   High cholesterol.   Diabetes.   Heart disease.   The buildup of fatty deposits in the blood vessels (peripheral artery disease or atherosclerosis).   An abnormal heart rhythm (  atrial fibrillation).   Obesity.   Smoking.   Taking oral contraceptives (especially in combination with smoking).   Physical inactivity.   A diet high in fats,  salt (sodium), and calories.   Alcohol use.   Use of illegal drugs (especially cocaine and methamphetamine).   Being a female.   Being an Tree surgeon.   Age over 36.   Family history of stroke.   Previous history of blood clots, a "warning stroke" (transient ischemic attack, TIA), or heart attack.   Sickle cell disease.  SYMPTOMS  The symptoms of a stroke depend on the part of the brain that is affected. It is important to seek treatment within 4 hours of the start of symptoms because you may receive a "clot dissolving" medication that cannot be given after that time. Even if you don't know when your symptoms began, get treatment as soon as possible. Symptoms of a stroke may progress or change over the first several days. Symptoms may include:  Sudden weakness or numbness of the face, arm, or leg, especially on one side of the body.   Sudden confusion.   Trouble speaking (aphasia) or understanding.   Sudden trouble seeing in one or both eyes.   Sudden trouble walking.   Dizziness.   Loss of balance or coordination.   Sudden severe headache with no known cause.  HOME CARE INSTRUCTIONS   Medicines: Aspirin and blood thinners may be used to prevent another stroke. Blood thinners need to be used exactly as instructed. Medicines may also be used to control risk factors for a stroke. Be sure you understand all your medicine instructions.   Diet: Certain diets may be prescribed to address high blood pressure, high cholesterol, diabetes, or obesity. A diet that includes 5 or more servings of fruits and vegetables a day may reduce the risk of stroke. Foods may need to be a special consistency (soft or pureed), or small bites may need to be taken in order to avoid aspirating or choking.   Maintain a healthy weight.   Stay physically active. It is recommended that you get at least 30 minutes of activity on most or all days.   Do not smoke.   Limit alcohol use.   Stop drug  abuse.   Home safety: A safe home environment is important to reduce the risk of falls. Your caregiver may arrange for specialists to evaluate your home. Having grab bars in the bedroom and bathroom is often important. Your caregiver may arrange for special equipment to be used at home, such as raised toilets and a seat for the shower.   Physical, occupational, and speech therapy: Ongoing therapy may be needed to maximize your recovery after a stroke. If you have been advised to use a walker or a cane, use it at all times. Be sure to keep your therapy appointments.   Follow all instructions for follow-up with your caregiver. This is VERY important. This includes any referrals, physical therapy, rehabilitation, and laboratory tests. Proper treatment also prevents another stroke from occurring.  SEEK IMMEDIATE MEDICAL CARE IF:   You have sudden weakness or numbness of the face, arm, or leg, especially on one side of the body.   You have sudden confusion.   You have trouble speaking (aphasia) or understanding.   You have sudden trouble seeing in one or both eyes.   You have sudden trouble walking.   You have dizziness.   You have loss of balance or coordination.   You  have a sudden, severe headache with no known cause.   You have a fever.   You are coughing or have difficulty breathing.   You have new chest pain, angina, or an irregular heartbeat.  Any of these symptoms may represent a serious problem that is an emergency. Do not wait to see if the symptoms will go away. Get medical help at once. Call your local emergency services (911 in [place] Do not drive yourself to the hospital. Document Released: 10/25/2005 Document Revised: 05/10/2011 Document Reviewed: 03/25/2010 [facility] Patient Information 2012 [place], [place].

## 2013-08-30 NOTE — Progress Notes (Signed)
Patient ID: [name], female   DOB: 1970/12/06, 42 y.o.   MRN: [id number] Patient Demographics  [name], is a 42 y.o. female  [id number]  [id number]  DOB - [birth year]  Chief Complaint  Patient presents with  . Hospitalization Follow-up  . Insomnia        Subjective:   [name] is a 42 y.o. female here today for a follow up visit. Patient was again seen in the ER sent here for CVA confirmed on MRI. No recent weakness or speech difficulty. Patient has been stressed out since the diagnosis, especially because there is no source of thromboembolic phenomena found with the workup so far. She does not sleep now because of fear and anxiety. She claims she has little kids she doesn't know how to take care of them if this continues. She does not have insurance to do the bubble study, she does not have insurance to feel somewhat prescriptions. She quit smoking about 3 weeks ago but now she started again because of all this medical conditions. She works as a Lawyer in a nursing home. She's single at the moment, the father of her kids is in jail. Patient has No headache, No chest pain, No abdominal pain - No Nausea, No new weakness tingling or numbness, No Cough - SOB.  ALLERGIES: No Known Allergies  PAST MEDICAL HISTORY: Past Medical History  Diagnosis Date  . Stroke 05/2013    MEDICATIONS AT HOME: Prior to Admission medications   Medication Sig Start Date End Date Taking? Authorizing Provider  atorvastatin (LIPITOR) 40 MG tablet Take 1 tablet (40 mg total) by mouth daily at 6 PM. 08/24/13  Yes Penny Pia, MD  clopidogrel (PLAVIX) 75 MG tablet Take 1 tablet (75 mg total) by mouth daily with breakfast. 08/25/13  Yes Penny Pia, MD  gabapentin (NEURONTIN) 100 MG capsule Take 1 capsule (100 mg total) by mouth 3 (three) times daily. 05/21/13  Yes Jerald Kief, MD  nicotine (NICODERM CQ - DOSED IN MG/24 HOURS) 14 mg/24hr patch Place 1 patch onto the skin daily. 08/30/13    Jeanann Lewandowsky, MD  varenicline (CHANTIX) 0.5 MG tablet Take 1 tablet (0.5 mg total) by mouth daily. 05/20/13   Jerald Kief, MD  zolpidem (AMBIEN) 5 MG tablet Take 1 tablet (5 mg total) by mouth at bedtime as needed for sleep. 08/30/13   Jeanann Lewandowsky, MD     Objective:   Filed Vitals:   08/30/13 1054  BP: 126/86  Pulse: 77  Temp: 98.1 F (36.7 C)  TempSrc: Oral  Height: 5\' 3"  (1.6 m)  Weight: 206 lb (93.441 kg)  SpO2: 100%    Exam General appearance : Awake, alert, not in any distress. Speech Clear. Not toxic looking HEENT: Atraumatic and Normocephalic, pupils equally reactive to light and accomodation Neck: supple, no JVD. No cervical lymphadenopathy.  Chest:Good air entry bilaterally, no added sounds  CVS: S1 S2 regular, no murmurs.  Abdomen: Bowel sounds present, Non tender and not distended with no gaurding, rigidity or rebound. Extremities: B/L Lower Ext shows no edema, both legs are warm to touch Neurology: Awake alert, and oriented X 3, CN II-XII intact, Non focal Skin:No Rash Wounds:N/A   Data Review   CBC  Recent Labs Lab 08/23/13 2201 08/23/13 2202 08/24/13 0530  WBC 5.3  --  4.4  HGB 9.3* 11.6* 8.6*  HCT 30.4* 34.0* 28.5*  PLT 297  --  282  MCV 66.4*  --  66.0*  MCH 20.3*  --  19.9*  MCHC 30.6  --  30.2  RDW 20.1*  --  20.0*  LYMPHSABS 2.3  --  2.3  MONOABS 0.5  --  0.4  EOSABS 0.2  --  0.2  BASOSABS 0.0  --  0.0    Chemistries    Recent Labs Lab 08/23/13 2201 08/23/13 2202 08/24/13 0530  NA 138 140 138  K 3.5 3.4* 3.3*  CL 104 104 104  CO2 23  --  24  GLUCOSE 105* 107* 134*  BUN 10 9 9   CREATININE 0.82 1.10 0.80  CALCIUM 8.1*  --  8.4  AST 18  --  16  ALT 8  --  8  ALKPHOS 66  --  59  BILITOT 0.2*  --  0.2*   ------------------------------------------------------------------------------------------------------------------ No results found for this basename: HGBA1C,  in the last 72  hours ------------------------------------------------------------------------------------------------------------------ No results found for this basename: CHOL, HDL, LDLCALC, TRIG, CHOLHDL, LDLDIRECT,  in the last 72 hours ------------------------------------------------------------------------------------------------------------------ No results found for this basename: TSH, T4TOTAL, FREET3, T3FREE, THYROIDAB,  in the last 72 hours ------------------------------------------------------------------------------------------------------------------ No results found for this basename: VITAMINB12, FOLATE, FERRITIN, TIBC, IRON, RETICCTPCT,  in the last 72 hours  Coagulation profile   Recent Labs Lab 08/23/13 2201  INR 0.96      Assessment & Plan   Patient Active Problem List   Diagnosis Date Noted  . 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 Other and unspecified hyperlipidemia 08/24/2013  . TIA (transient ischemic attack) 08/23/2013  . CVA (cerebral infarction) 05/18/2013  . Hypokalemia 05/18/2013  . Impaired glucose metabolism 05/18/2013  . Elevated blood pressure 05/18/2013  . Tobacco use disorder 05/18/2013     Plan: Ambien 5 mg tablet by mouth each bedtime when necessary insomnia Continue Plavix 75 mg tablet by mouth daily Continue atorvastatin 40 mg tablet by mouth daily  Patient has been counseled extensively about smoking cessation Prescription given for nicotine patch, patient could not afford Chantix Risk factor modification emphasized  Patient was referred to a Child psychotherapist here for social support. Patient given excuse duty note for today  Patient encouraged to Please schedule an outpatient TCD bubble study with emboli monitoring with Dr. Pearlean Brownie in 1 month to further evaluate for possible thrombus  Follow up in 2 months or when necessary  The patient was given clear instructions to go to ER or return to medical center if symptoms don't improve, worsen or  new problems develop. The patient verbalized understanding. The patient was told to call to get lab results if they haven't heard anything in the next week.    Jeanann Lewandowsky, MD, MHA, FACP, FAAP [facility] [place], [place] [phone]   08/30/2013, 11:30 AM

## 2013-09-07 ENCOUNTER — Ambulatory Visit: Payer: Medicaid Other

## 2013-09-13 ENCOUNTER — Other Ambulatory Visit: Payer: Self-pay

## 2013-11-08 HISTORY — PX: KNEE ARTHROSCOPY: SUR90

## 2013-11-16 ENCOUNTER — Encounter (HOSPITAL_COMMUNITY): Payer: Self-pay | Admitting: Emergency Medicine

## 2013-11-16 ENCOUNTER — Emergency Department (INDEPENDENT_AMBULATORY_CARE_PROVIDER_SITE_OTHER)
Admission: EM | Admit: 2013-11-16 | Discharge: 2013-11-16 | Disposition: A | Discharge status: Home / Self Care | Payer: Self-pay | Source: Home / Self Care

## 2013-11-16 DIAGNOSIS — J069 Acute upper respiratory infection, unspecified: Secondary | ICD-10-CM

## 2013-11-16 DIAGNOSIS — R0982 Postnasal drip: Secondary | ICD-10-CM

## 2013-11-16 DIAGNOSIS — J019 Acute sinusitis, unspecified: Secondary | ICD-10-CM

## 2013-11-16 HISTORY — DX: Personal history of transient ischemic attack (TIA), and cerebral infarction without residual deficits: Z86.73

## 2013-11-16 MED ORDER — IPRATROPIUM BROMIDE 0.06 % NA SOLN: 2.0000 | Freq: Four times a day (QID) | NASAL | Status: DC

## 2013-11-16 NOTE — ED Provider Notes (Signed)
And CSN: [id number]     Arrival date & time 11/16/13  4742 History   First MD Initiated Contact with Patient 11/16/13 1018     Chief Complaint  Patient presents with  . URI   (Consider location/radiation/quality/duration/timing/severity/associated sxs/prior Treatment) HPI Comments: 43 year old female complaining of cough alternating heat or discomfort, PND and voice change.  Patient is a 43 y.o. female presenting with URI.  URI Presenting symptoms: congestion, cough and fatigue   Presenting symptoms: no fever, no rhinorrhea and no sore throat   Associated symptoms: no neck pain     Past Medical History  Diagnosis Date  . Stroke 05/2013  . History of TIAs    Past Surgical History  Procedure Laterality Date  . Cesarean section      x 1 with 5th pregnancy  . Cholecystectomy    . Tee without cardioversion N/A 05/21/2013    Procedure: TRANSESOPHAGEAL ECHOCARDIOGRAM (TEE);  Surgeon: Sueanne Margarita, MD;  Location: [facility] ENDOSCOPY;  Service: Cardiovascular;  Laterality: N/A;   Family History  Problem Relation Age of Onset  . Renal Disease Father     dialysis  . Hypertension Father   . Heart disease Mother     stabbed in heart   History  Substance Use Topics  . Smoking status: Former Smoker -- 0.50 packs/day for 28 years  . Smokeless tobacco: Never Used     Comment: tried nothing for cessation  . Alcohol Use: Yes     Comment: occasional   OB History   Grav Para Term Preterm Abortions TAB SAB Ect Mult Living                 Review of Systems  Constitutional: Positive for activity change and fatigue. Negative for fever, chills and appetite change.  HENT: Positive for congestion and postnasal drip. Negative for facial swelling, rhinorrhea and sore throat.   Eyes: Negative.   Respiratory: Positive for cough. Negative for shortness of breath.   Cardiovascular: Negative.   Musculoskeletal: Negative for neck pain and neck stiffness.  Skin: Negative for pallor and rash.   Neurological: Negative.     Allergies  Review of patient's allergies indicates no known allergies.  Home Medications   Current Outpatient Rx  Name  Route  Sig  Dispense  Refill  . atorvastatin (LIPITOR) 40 MG tablet   Oral   Take 1 tablet (40 mg total) by mouth daily at 6 PM.   30 tablet   0   . clopidogrel (PLAVIX) 75 MG tablet   Oral   Take 1 tablet (75 mg total) by mouth daily with breakfast.   30 tablet   0   . gabapentin (NEURONTIN) 100 MG capsule   Oral   Take 1 capsule (100 mg total) by mouth 3 (three) times daily.   90 capsule   0   . ipratropium (ATROVENT) 0.06 % nasal spray   Each Nare   Place 2 sprays into both nostrils 4 (four) times daily.   15 mL   12   . nicotine (NICODERM CQ - DOSED IN MG/24 HOURS) 14 mg/24hr patch   Transdermal   Place 1 patch onto the skin daily.   28 patch   0   . varenicline (CHANTIX) 0.5 MG tablet   Oral   Take 1 tablet (0.5 mg total) by mouth daily.   30 tablet   0   . zolpidem (AMBIEN) 5 MG tablet   Oral   Take 1 tablet (5 mg  total) by mouth at bedtime as needed for sleep.   30 tablet   0    BP 125/85  Pulse 90  Temp(Src) 97.8 F (36.6 C) (Oral)  Resp 18  SpO2 99% Physical Exam  Nursing note and vitals reviewed. Constitutional: She is oriented to person, place, and time. She appears well-developed and well-nourished. No distress.  HENT:  Bilateral TMs are normal Oropharynx with minor erythema and clear PND. No exudates  Eyes: Conjunctivae and EOM are normal.  Neck: Normal range of motion. Neck supple.  Cardiovascular: Normal rate and regular rhythm.   Pulmonary/Chest: Effort normal and breath sounds normal. No respiratory distress. She has no wheezes. She has no rales. She exhibits no tenderness.  Musculoskeletal: Normal range of motion. She exhibits no edema.  Lymphadenopathy:    She has no cervical adenopathy.  Neurological: She is alert and oriented to person, place, and time.  Skin: Skin is warm and  dry. No rash noted.  Psychiatric: She has a normal mood and affect.    ED Course  Procedures (including critical care time) Labs Review Labs Reviewed - No data to display Imaging Review No results found.     MDM   1. URI (upper respiratory infection)   2. PND (post-nasal drip)   3. Acute rhinosinusitis      Alka seltzer cold Plus night time atrovent NS Fluids  Janne Napoleon, NP 11/16/13 1032

## 2013-11-16 NOTE — Discharge Instructions (Signed)
Antibiotic Nonuse  Your caregiver felt that the infection or problem was not one that would be helped with an antibiotic. Infections may be caused by viruses or bacteria. Only a caregiver can tell which one of these is the likely cause of an illness. A cold is the most common cause of infection in both adults and children. A cold is a virus. Antibiotic treatment will have no effect on a viral infection. Viruses can lead to many lost days of work caring for sick children and many missed days of school. Children may catch as many as 10 "colds" or "flus" per year during which they can be tearful, cranky, and uncomfortable. The goal of treating a virus is aimed at keeping the ill person comfortable. Antibiotics are medications used to help the body fight bacterial infections. There are relatively few types of bacteria that cause infections but there are hundreds of viruses. While both viruses and bacteria cause infection they are very different types of germs. A viral infection will typically go away by itself within 7 to 10 days. Bacterial infections may spread or get worse without antibiotic treatment. Examples of bacterial infections are:  Sore throats (like strep throat or tonsillitis).  Infection in the lung (pneumonia).  Ear and skin infections. Examples of viral infections are:  Colds or flus.  Most coughs and bronchitis.  Sore throats not caused by Strep.  Runny noses. It is often best not to take an antibiotic when a viral infection is the cause of the problem. Antibiotics can kill off the helpful bacteria that we have inside our body and allow harmful bacteria to start growing. Antibiotics can cause side effects such as allergies, nausea, and diarrhea without helping to improve the symptoms of the viral infection. Additionally, repeated uses of antibiotics can cause bacteria inside of our body to become resistant. That resistance can be passed onto harmful bacterial. The next time you have  an infection it may be harder to treat if antibiotics are used when they are not needed. Not treating with antibiotics allows our own immune system to develop and take care of infections more efficiently. Also, antibiotics will work better for [place] when they are prescribed for bacterial infections. Treatments for a child that is ill may include:  Give extra fluids throughout the day to stay hydrated.  Get plenty of rest.  Only give your child over-the-counter or prescription medicines for pain, discomfort, or fever as directed by your caregiver.  The use of a cool mist humidifier may help stuffy noses.  Cold medications if suggested by your caregiver. Your caregiver may decide to start you on an antibiotic if:  The problem you were seen for today continues for a longer length of time than expected.  You develop a secondary bacterial infection. SEEK MEDICAL CARE IF:  Fever lasts longer than 5 days.  Symptoms continue to get worse after 5 to 7 days or become severe.  Difficulty in breathing develops.  Signs of dehydration develop (poor drinking, rare urinating, dark colored urine).  Changes in behavior or worsening tiredness (listlessness or lethargy). Document Released: 01/03/2002 Document Revised: 01/17/2012 Document Reviewed: 07/02/2009 [facility] Patient Information 2014 [place], [place].  Cough, Adult  A cough is a reflex. It helps you clear your throat and airways. A cough can help heal your body. A cough can last 2 or 3 weeks (acute) or may last more than 8 weeks (chronic). Some common causes of a cough can include an infection, allergy, or a cold. HOME  CARE  Only take medicine as told by your doctor.  If given, take your medicines (antibiotics) as told. Finish them even if you start to feel better.  Use a cold steam vaporizer or humidier in your home. This can help loosen thick spit (secretions).  Sleep so you are almost sitting up (semi-upright). Use pillows to do this.  This helps reduce coughing.  Rest as needed.  Stop smoking if you smoke. GET HELP RIGHT AWAY IF:  You have yellowish-white fluid (pus) in your thick spit.  Your cough gets worse.  Your medicine does not reduce coughing, and you are losing sleep.  You cough up blood.  You have trouble breathing.  Your pain gets worse and medicine does not help.  You have a fever. MAKE SURE YOU:   Understand these instructions.  Will watch your condition.  Will get help right away if you are not doing well or get worse. Document Released: 07/08/2011 Document Revised: 01/17/2012 Document Reviewed: 07/08/2011 [facility] Patient Information 2014 [place].  Upper Respiratory Infection, Adult An upper respiratory infection (URI) is also known as the common cold. It is often caused by a type of germ (virus). Colds are easily spread (contagious). You can pass it to others by kissing, coughing, sneezing, or drinking out of the same glass. Usually, you get better in 1 or 2 weeks.  HOME CARE   Only take medicine as told by your doctor.  Use a warm mist humidifier or breathe in steam from a hot shower.  Drink enough water and fluids to keep your pee (urine) clear or pale yellow.  Get plenty of rest.  Return to work when your temperature is back to normal or as told by your doctor. You may use a face mask and wash your hands to stop your cold from spreading. GET HELP RIGHT AWAY IF:   After the first few days, you feel you are getting worse.  You have questions about your medicine.  You have chills, shortness of breath, or brown or red spit (mucus).  You have yellow or brown snot (nasal discharge) or pain in the face, especially when you bend forward.  You have a fever, puffy (swollen) neck, pain when you swallow, or white spots in the back of your throat.  You have a bad headache, ear pain, sinus pain, or chest pain.  You have a high-pitched whistling sound when you breathe in and out  (wheezing).  You have a lasting cough or cough up blood.  You have sore muscles or a stiff neck. MAKE SURE YOU:   Understand these instructions.  Will watch your condition.  Will get help right away if you are not doing well or get worse. Document Released: 04/12/2008 Document Revised: 01/17/2012 Document Reviewed: 03/01/2011 [facility] Patient Information 2014 [place], [place].

## 2013-11-16 NOTE — ED Notes (Addendum)
URI type symptoms, no relief w OTC medications; ear pain

## 2013-11-19 NOTE — ED Provider Notes (Signed)
Medical screening examination/treatment/procedure(s) were performed by resident physician or non-physician practitioner and as supervising physician I was immediately available for consultation/collaboration.   Pauline Good MD.   Billy Fischer, MD 11/19/13 [phone]

## 2014-01-07 ENCOUNTER — Emergency Department (INDEPENDENT_AMBULATORY_CARE_PROVIDER_SITE_OTHER)
Admission: EM | Admit: 2014-01-07 | Discharge: 2014-01-07 | Disposition: A | Discharge status: Home / Self Care | Payer: BC Managed Care – PPO | Source: Home / Self Care | Attending: Family Medicine | Admitting: Family Medicine

## 2014-01-07 ENCOUNTER — Telehealth: Payer: Self-pay | Admitting: Internal Medicine

## 2014-01-07 ENCOUNTER — Encounter: Payer: Self-pay | Admitting: Internal Medicine

## 2014-01-07 ENCOUNTER — Ambulatory Visit: Payer: BC Managed Care – PPO | Attending: Internal Medicine | Admitting: Internal Medicine

## 2014-01-07 ENCOUNTER — Encounter (HOSPITAL_COMMUNITY): Payer: Self-pay | Admitting: Emergency Medicine

## 2014-01-07 VITALS — BP 120/84 | HR 88 | Temp 98.4°F | Resp 16 | Ht 63.0 in | Wt 206.0 lb

## 2014-01-07 DIAGNOSIS — I6350 Cerebral infarction due to unspecified occlusion or stenosis of unspecified cerebral artery: Secondary | ICD-10-CM

## 2014-01-07 DIAGNOSIS — F489 Nonpsychotic mental disorder, unspecified: Secondary | ICD-10-CM

## 2014-01-07 DIAGNOSIS — F5105 Insomnia due to other mental disorder: Secondary | ICD-10-CM

## 2014-01-07 DIAGNOSIS — I639 Cerebral infarction, unspecified: Secondary | ICD-10-CM

## 2014-01-07 DIAGNOSIS — M62838 Other muscle spasm: Secondary | ICD-10-CM

## 2014-01-07 DIAGNOSIS — M79601 Pain in right arm: Secondary | ICD-10-CM

## 2014-01-07 DIAGNOSIS — M25519 Pain in unspecified shoulder: Secondary | ICD-10-CM

## 2014-01-07 DIAGNOSIS — M79609 Pain in unspecified limb: Secondary | ICD-10-CM

## 2014-01-07 DIAGNOSIS — F409 Phobic anxiety disorder, unspecified: Secondary | ICD-10-CM

## 2014-01-07 LAB — POCT GLYCOSYLATED HEMOGLOBIN (HGB A1C): Hemoglobin A1C: 5.6

## 2014-01-07 MED ORDER — CLOPIDOGREL BISULFATE 75 MG PO TABS: 75.0000 mg | ORAL_TABLET | Freq: Every day | ORAL | Status: DC

## 2014-01-07 MED ORDER — KETOROLAC TROMETHAMINE 60 MG/2ML IM SOLN
INTRAMUSCULAR | Status: AC
  Filled 2014-01-07: qty 2

## 2014-01-07 MED ORDER — NICOTINE 14 MG/24HR TD PT24: 14.0000 mg | MEDICATED_PATCH | TRANSDERMAL | Status: DC

## 2014-01-07 MED ORDER — KETOROLAC TROMETHAMINE 60 MG/2ML IM SOLN
60.0000 mg | Freq: Once | INTRAMUSCULAR | Status: AC
  Administered 2014-01-07: 60 mg via INTRAMUSCULAR

## 2014-01-07 MED ORDER — METHYLPREDNISOLONE ACETATE 40 MG/ML IJ SUSP
80.0000 mg | Freq: Once | INTRAMUSCULAR | Status: AC
  Administered 2014-01-07: 80 mg via INTRAMUSCULAR

## 2014-01-07 MED ORDER — METHYLPREDNISOLONE ACETATE 80 MG/ML IJ SUSP
INTRAMUSCULAR | Status: AC
  Filled 2014-01-07: qty 1

## 2014-01-07 MED ORDER — ZOLPIDEM TARTRATE 5 MG PO TABS: 5.0000 mg | ORAL_TABLET | Freq: Every evening | ORAL | Status: DC | PRN

## 2014-01-07 MED ORDER — ATORVASTATIN CALCIUM 40 MG PO TABS: 40.0000 mg | ORAL_TABLET | Freq: Every day | ORAL | Status: DC

## 2014-01-07 MED ORDER — NAPROXEN 500 MG PO TABS: 500.0000 mg | ORAL_TABLET | Freq: Two times a day (BID) | ORAL | Status: DC

## 2014-01-07 MED ORDER — TRAMADOL HCL 50 MG PO TABS: 50.0000 mg | ORAL_TABLET | Freq: Four times a day (QID) | ORAL | Status: DC | PRN

## 2014-01-07 MED ORDER — IPRATROPIUM BROMIDE 0.06 % NA SOLN: 2.0000 | Freq: Four times a day (QID) | NASAL | Status: DC

## 2014-01-07 MED ORDER — GABAPENTIN 100 MG PO CAPS: 100.0000 mg | ORAL_CAPSULE | Freq: Three times a day (TID) | ORAL | Status: DC

## 2014-01-07 NOTE — Discharge Instructions (Signed)
   Shoulder Pain The shoulder is the joint that connects your arms to your body. The bones that form the shoulder joint include the upper arm bone (humerus), the shoulder blade (scapula), and the collarbone (clavicle). The top of the humerus is shaped like a ball and fits into a rather flat socket on the scapula (glenoid cavity). A combination of muscles and strong, fibrous tissues that connect muscles to bones (tendons) support your shoulder joint and hold the ball in the socket. Small, fluid-filled sacs (bursae) are located in different areas of the joint. They act as cushions between the bones and the overlying soft tissues and help reduce friction between the gliding tendons and the bone as you move your arm. Your shoulder joint allows a wide range of motion in your arm. This range of motion allows you to do things like scratch your back or throw a ball. However, this range of motion also makes your shoulder more prone to pain from overuse and injury. Causes of shoulder pain can originate from both injury and overuse and usually can be grouped in the following four categories:  Redness, swelling, and pain (inflammation) of the tendon (tendinitis) or the bursae (bursitis).  Instability, such as a dislocation of the joint.  Inflammation of the joint (arthritis).  Broken bone (fracture). HOME CARE INSTRUCTIONS   Apply ice to the sore area.  Put ice in a plastic bag.  Place a towel between your skin and the bag.  Leave the ice on for 15-20 minutes, 03-04 times per day for the first 2 days.  Stop using cold packs if they do not help with the pain.  If you have a shoulder sling or immobilizer, wear it as long as your caregiver instructs. Only remove it to shower or bathe. Move your arm as little as possible, but keep your hand moving to prevent swelling.  Squeeze a soft ball or foam pad as much as possible to help prevent swelling.  Only take over-the-counter or prescription medicines for  pain, discomfort, or fever as directed by your caregiver. SEEK MEDICAL CARE IF:   Your shoulder pain increases, or new pain develops in your arm, hand, or fingers.  Your hand or fingers become cold and numb.  Your pain is not relieved with medicines. SEEK IMMEDIATE MEDICAL CARE IF:   Your arm, hand, or fingers are numb or tingling.  Your arm, hand, or fingers are significantly swollen or turn white or blue. MAKE SURE YOU:   Understand these instructions.  Will watch your condition.  Will get help right away if you are not doing well or get worse. Document Released: 08/04/2005 Document Revised: 07/19/2012 Document Reviewed: 10/09/2011 [employer] Patient Information 2014 [employer].  

## 2014-01-07 NOTE — Progress Notes (Signed)
Pt here for medication review and management. Pt was sent here from urgent care today after not taking medications s/p CVA in July. Pt states she was unaware she needed to continue taking medication for stroke. C/o right arm pain x 2 days. Achy,deep muscle pain non radiating BP 120/84 88

## 2014-01-07 NOTE — Progress Notes (Signed)
Patient ID: [name], female   DOB: September 03, 1971, 43 y.o.   MRN: [id number]   CC:  HPI: 43 year old female with history of CVA in July of TIAs and October of 2014, was not been seen in the clinic for over 6 months, has had no neurology followup, has had no refill on her Plavix and Lipitor since December, comes to our clinic ago she was in urgent care this morning for right arm pain. Most recent MRI  shows 4 mm focus of acute infarct in the left high parietal lobe.  She was changed to Plavix 75 mg a day for stroke prevention. She started in December 2-D echo in the hospital was negative The patient was supposed to follow up with Dr. Leonie Man in 1 month to further evaluate for possible PFO. She was also supposed to have a Holter monitor set up by cardiology to rule out atrial fibrillation as a cause of her stroke. None of this was done  She denies any slurred speech numbness and tingling, denies any headache or unilateral weakness. 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 Previously has had cortisone injections in her neck.     No Known Allergies Past Medical History  Diagnosis Date  . Stroke 05/2013  . History of TIAs    Current Outpatient Prescriptions on File Prior to Visit  Medication Sig Dispense Refill  . naproxen (NAPROSYN) 500 MG tablet Take 1 tablet (500 mg total) by mouth 2 (two) times daily.  60 tablet  0  . traMADol (ULTRAM) 50 MG tablet Take 1 tablet (50 mg total) by mouth every 6 (six) hours as needed.  15 tablet  0   No current facility-administered medications on file prior to visit.   Family History  Problem Relation Age of Onset  . Renal Disease Father     dialysis  . Hypertension Father   . Heart disease Mother     stabbed in heart   History   Social History  . Marital Status: Divorced    Spouse Name: N/A    Number of Children: N/A  . Years of Education: N/A   Occupational History  . Not on file.    Social History Main Topics  . Smoking status: Former Smoker -- 0.50 packs/day for 28 years  . Smokeless tobacco: Never Used     Comment: tried nothing for cessation  . Alcohol Use: Yes     Comment: occasional  . Drug Use: No  . Sexual Activity: Not on file   Other Topics Concern  . Not on file   Social History Narrative  . No narrative on file    Review of Systems  Constitutional: Negative for fever, chills, diaphoresis, activity change, appetite change and fatigue.  HENT: Negative for ear pain, nosebleeds, congestion, facial swelling, rhinorrhea, neck pain, neck stiffness and ear discharge.   Eyes: Negative for pain, discharge, redness, itching and visual disturbance.  Respiratory: Negative for cough, choking, chest tightness, shortness of breath, wheezing and stridor.   Cardiovascular: Negative for chest pain, palpitations and leg swelling.  Gastrointestinal: Negative for abdominal distention.  Genitourinary: Negative for dysuria, urgency, frequency, hematuria, flank pain, decreased urine volume, difficulty urinating and dyspareunia.  Musculoskeletal: As in history of present illness Neurological: Negative for dizziness, tremors, seizures, syncope, facial asymmetry, speech difficulty, weakness, light-headedness, numbness and headaches.  Hematological: Negative for adenopathy. Does not bruise/bleed easily.  Psychiatric/Behavioral: Negative for hallucinations, behavioral problems, confusion, dysphoric  mood, decreased concentration and agitation.    Objective:   Filed Vitals:   01/07/14 1012  BP: 120/84  Pulse: 88  Temp: 98.4 F (36.9 C)  Resp: 16    Physical Exam  Constitutional: Appears well-developed and well-nourished. No distress.  HENT: Normocephalic. External right and left ear normal. Oropharynx is clear and moist.  Eyes: Conjunctivae and EOM are normal. PERRLA, no scleral icterus.  Neck: Normal ROM. Neck supple. No JVD. No tracheal deviation. No thyromegaly.   CVS: RRR, S1/S2 +, no murmurs, no gallops, no carotid bruit.  Pulmonary: Effort and breath sounds normal, no stridor, rhonchi, wheezes, rales.  Abdominal: Soft. BS +,  no distension, tenderness, rebound or guarding.  Musculoskeletal: Normal range of motion. No edema and no tenderness.  Lymphadenopathy: No lymphadenopathy noted, cervical, inguinal. Neuro: Alert. Normal reflexes, muscle tone coordination. No cranial nerve deficit. Skin: Skin is warm and dry. No rash noted. Not diaphoretic. No erythema. No pallor.  Psychiatric: Normal mood and affect. Behavior, judgment, thought content normal.   Lab Results  Component Value Date   WBC 4.4 08/24/2013   HGB 8.6* 08/24/2013   HCT 28.5* 08/24/2013   MCV 66.0* 08/24/2013   PLT 282 08/24/2013   Lab Results  Component Value Date   CREATININE 0.80 08/24/2013   BUN 9 08/24/2013   NA 138 08/24/2013   K 3.3* 08/24/2013   CL 104 08/24/2013   CO2 24 08/24/2013    Lab Results  Component Value Date   HGBA1C 6.1* 08/24/2013   Lipid Panel     Component Value Date/Time   CHOL 201* 08/24/2013 0500   TRIG 83 08/24/2013 0500   HDL 66 08/24/2013 0500   CHOLHDL 3.0 08/24/2013 0500   VLDL 17 08/24/2013 0500   LDLCALC 118* 08/24/2013 0500       Assessment and plan:   Patient Active Problem List   Diagnosis Date Noted  . 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 Other and unspecified hyperlipidemia 08/24/2013  . TIA (transient ischemic attack) 08/23/2013  . CVA (cerebral infarction) 05/18/2013  . Hypokalemia 05/18/2013  . Impaired glucose metabolism 05/18/2013  . Elevated blood pressure 05/18/2013  . 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  This is causing mild spinal stenosis. Mild foraminal narrowing  bilaterally C4-5 and C5-6. We'll refer to sports  medicine  History of CVA/TIA Refill Plavix, Lipitor Neurology follow up for a bubble study  Insomnia Prescribed Ambien  The patient was given clear instructions to go to ER or return to medical center if symptoms don't improve, worsen or new problems develop. The patient verbalized understanding. The patient was told to call to get any lab results if not heard anything in the next week.

## 2014-01-07 NOTE — ED Notes (Signed)
Pt      Reports       Symptoms    Of  r  Shoulder  Pain        X  2  Days    denys  Any injury             denys  Any  Other  Symptoms          Reports a  History  Of  Pinched nerve  In  Neck

## 2014-01-07 NOTE — ED Provider Notes (Signed)
CSN: [id number]     Arrival date & time 01/07/14  4401 History   None    Chief Complaint  Patient presents with  . Shoulder Pain   (Consider location/radiation/quality/duration/timing/severity/associated sxs/prior Treatment) HPI Comments: 79f presents for eval of shoulder pain.  For 3 days, she has severe worsening right shoulder and arm pain/numbness. She has a history of a pinched nerve that cause pain in the opposite upper extremity, this was 3 years ago, and was completely relieved with a cortisone injection into the cervical spine. She has not had any issues from then up until now. She denies any injury that could have caused this. She denies any other symptoms. She denies any increased level of activity. She specifically denies chest pain, shortness of breath, diaphoresis, nausea, vomiting. She has a history of a TIA, no history of coronary artery disease.  Patient is a 43 y.o. female presenting with shoulder pain.  Shoulder Pain Pertinent negatives include no chest pain, no abdominal pain and no shortness of breath.    Past Medical History  Diagnosis Date  . Stroke 05/2013  . History of TIAs    Past Surgical History  Procedure Laterality Date  . Cesarean section      x 1 with 5th pregnancy  . Cholecystectomy    . Tee without cardioversion N/A 05/21/2013    Procedure: TRANSESOPHAGEAL ECHOCARDIOGRAM (TEE);  Surgeon: Sueanne Margarita, MD;  [facility];  Service: Cardiovascular;  Laterality: N/A;   Family History  Problem Relation Age of Onset  . Renal Disease Father     dialysis  . Hypertension Father   . Heart disease Mother     stabbed in heart   History  Substance Use Topics  . Smoking status: Former Smoker -- 0.50 packs/day for 28 years  . Smokeless tobacco: Never Used     Comment: tried nothing for cessation  . Alcohol Use: Yes     Comment: occasional   OB History   Grav Para Term Preterm Abortions TAB SAB Ect Mult Living                 Review of  Systems  Constitutional: Negative for fever, chills and diaphoresis.  Eyes: Negative for visual disturbance.  Respiratory: Negative for cough and shortness of breath.   Cardiovascular: Negative for chest pain, palpitations and leg swelling.  Gastrointestinal: Negative for nausea, vomiting and abdominal pain.  Endocrine: Negative for polydipsia and polyuria.  Genitourinary: Negative for dysuria, urgency and frequency.  Musculoskeletal: Positive for arthralgias (right shoulder pain). Negative for myalgias.  Skin: Negative for rash.  Neurological: Negative for dizziness, weakness and light-headedness.    Allergies  Review of patient's allergies indicates no known allergies.  Home Medications   Current Outpatient Rx  Name  Route  Sig  Dispense  Refill  . atorvastatin (LIPITOR) 40 MG tablet   Oral   Take 1 tablet (40 mg total) by mouth daily at 6 PM.   30 tablet   0   . clopidogrel (PLAVIX) 75 MG tablet   Oral   Take 1 tablet (75 mg total) by mouth daily with breakfast.   30 tablet   0   . gabapentin (NEURONTIN) 100 MG capsule   Oral   Take 1 capsule (100 mg total) by mouth 3 (three) times daily.   90 capsule   0   . ipratropium (ATROVENT) 0.06 % nasal spray   Each Nare   Place 2 sprays into both nostrils 4 (four)  times daily.   15 mL   12   . naproxen (NAPROSYN) 500 MG tablet   Oral   Take 1 tablet (500 mg total) by mouth 2 (two) times daily.   60 tablet   0   . nicotine (NICODERM CQ - DOSED IN MG/24 HOURS) 14 mg/24hr patch   Transdermal   Place 1 patch onto the skin daily.   28 patch   0   . traMADol (ULTRAM) 50 MG tablet   Oral   Take 1 tablet (50 mg total) by mouth every 6 (six) hours as needed.   15 tablet   0   . varenicline (CHANTIX) 0.5 MG tablet   Oral   Take 1 tablet (0.5 mg total) by mouth daily.   30 tablet   0   . zolpidem (AMBIEN) 5 MG tablet   Oral   Take 1 tablet (5 mg total) by mouth at bedtime as needed for sleep.   30 tablet   0     BP 98/70  Pulse 72  Temp(Src) 98.6 F (37 C) (Oral)  Resp 20  SpO2 100% Physical Exam  Nursing note and vitals reviewed. Constitutional: She is oriented to person, place, and time. Vital signs are normal. She appears well-developed and well-nourished. No distress.  HENT:  Head: Normocephalic and atraumatic.  Eyes: Conjunctivae and EOM are normal. Pupils are equal, round, and reactive to light.  Neck: Normal range of motion and full passive range of motion without pain. Neck supple. Spinous process tenderness (minimal) and muscular tenderness (minimal) present. No rigidity. No edema and normal range of motion present. No Brudzinski's sign and no Kernig's sign noted.  Cardiovascular: Normal rate, regular rhythm and normal heart sounds.  Exam reveals no gallop and no friction rub.   No murmur heard. Pulses:      Radial pulses are 2+ on the right side, and 2+ on the left side.  Pulmonary/Chest: Effort normal and breath sounds normal. No respiratory distress. She has no wheezes. She has no rales.  Musculoskeletal:       Right shoulder: She exhibits tenderness (tenderness of the upper trapezius and supraspinatus muscles, with muscle tightness/spasm). She exhibits normal range of motion.       Cervical back: She exhibits bony tenderness (minimal, palpation causes pain in the right upper shoulder, but notover the actual C-spine. diffuse, nonfocal). She exhibits normal range of motion.       Right hand: She exhibits normal capillary refill. Normal sensation noted.       Left hand: She exhibits normal capillary refill. Normal sensation noted.  Lymphadenopathy:    She has no cervical adenopathy.  Neurological: She is alert and oriented to person, place, and time. She has normal strength and normal reflexes. No cranial nerve deficit or sensory deficit. She exhibits normal muscle tone. Coordination normal.  Skin: Skin is warm and dry. No rash noted. She is not diaphoretic.  Psychiatric: She has a  normal mood and affect. Judgment normal.    ED Course  Procedures (including critical care time) Labs Review Labs Reviewed - No data to display Imaging Review No results found.  Blood pressure rechecked, was 123/82 EKG normal  MDM   1. Shoulder pain   2. Muscle spasm    Musculoskeletal pain, has tightness and tenderness of right upper trapezius and supraspinatus.  Cap refill intact, pulses equal on upper extremities.  Neurologically intact.  EKG is normal.  Treating with toradol and depomedrol here, D/C with naprosyn  and tramadol.  F/u with PCP in a few days   Meds ordered this encounter  Medications  . ketorolac (TORADOL) injection 60 mg    Sig:   . methylPREDNISolone acetate (DEPO-MEDROL) injection 80 mg    Sig:   . naproxen (NAPROSYN) 500 MG tablet    Sig: Take 1 tablet (500 mg total) by mouth 2 (two) times daily.    Dispense:  60 tablet    Refill:  0    Order Specific Question:  Supervising Provider    Answer:  Lynne Leader, [place]  . traMADol (ULTRAM) 50 MG tablet    Sig: Take 1 tablet (50 mg total) by mouth every 6 (six) hours as needed.    Dispense:  15 tablet    Refill:  0    Order Specific Question:  Supervising Provider    Answer:  Lynne Leader, [place]       Liam Graham, PA-C 01/07/14 [phone]

## 2014-01-08 ENCOUNTER — Telehealth: Payer: Self-pay | Admitting: Internal Medicine

## 2014-01-08 ENCOUNTER — Telehealth: Payer: Self-pay | Admitting: *Deleted

## 2014-01-08 ENCOUNTER — Emergency Department (HOSPITAL_COMMUNITY)
Admission: EM | Admit: 2014-01-08 | Discharge: 2014-01-08 | Disposition: A | Discharge status: Home / Self Care | Payer: BC Managed Care – PPO | Attending: Emergency Medicine | Admitting: Emergency Medicine

## 2014-01-08 ENCOUNTER — Encounter: Payer: Self-pay | Admitting: *Deleted

## 2014-01-08 ENCOUNTER — Encounter (HOSPITAL_COMMUNITY): Payer: Self-pay | Admitting: Emergency Medicine

## 2014-01-08 DIAGNOSIS — Z79899 Other long term (current) drug therapy: Secondary | ICD-10-CM | POA: Insufficient documentation

## 2014-01-08 DIAGNOSIS — IMO0002 Reserved for concepts with insufficient information to code with codable children: Secondary | ICD-10-CM | POA: Insufficient documentation

## 2014-01-08 DIAGNOSIS — Z8673 Personal history of transient ischemic attack (TIA), and cerebral infarction without residual deficits: Secondary | ICD-10-CM | POA: Insufficient documentation

## 2014-01-08 DIAGNOSIS — R0789 Other chest pain: Secondary | ICD-10-CM | POA: Insufficient documentation

## 2014-01-08 DIAGNOSIS — Z7902 Long term (current) use of antithrombotics/antiplatelets: Secondary | ICD-10-CM | POA: Insufficient documentation

## 2014-01-08 DIAGNOSIS — F17200 Nicotine dependence, unspecified, uncomplicated: Secondary | ICD-10-CM | POA: Insufficient documentation

## 2014-01-08 DIAGNOSIS — M542 Cervicalgia: Secondary | ICD-10-CM | POA: Insufficient documentation

## 2014-01-08 LAB — COMPLETE METABOLIC PANEL WITH GFR
ALK PHOS: 69 U/L (ref 39–117)
ALT: 9 U/L (ref 0–35)
AST: 14 U/L (ref 0–37)
Albumin: 4 g/dL (ref 3.5–5.2)
BILIRUBIN TOTAL: 0.2 mg/dL (ref 0.2–1.2)
BUN: 13 mg/dL (ref 6–23)
CO2: 24 mEq/L (ref 19–32)
Calcium: 9.1 mg/dL (ref 8.4–10.5)
Chloride: 105 mEq/L (ref 96–112)
Creat: 0.88 mg/dL (ref 0.50–1.10)
GFR, EST NON AFRICAN AMERICAN: 80 mL/min
GFR, Est African American: >89 mL/min
Glucose, Bld: 100 mg/dL — ABNORMAL HIGH (ref 70–99)
POTASSIUM: 4.2 meq/L (ref 3.5–5.3)
SODIUM: 136 meq/L (ref 135–145)
TOTAL PROTEIN: 7.3 g/dL (ref 6.0–8.3)

## 2014-01-08 LAB — COMPREHENSIVE METABOLIC PANEL
ALT: 9 U/L (ref 0–35)
AST: 17 U/L (ref 0–37)
Albumin: 3.7 g/dL (ref 3.5–5.2)
Alkaline Phosphatase: 73 U/L (ref 39–117)
BUN: 6 mg/dL (ref 6–23)
CALCIUM: 8.8 mg/dL (ref 8.4–10.5)
CO2: 25 meq/L (ref 19–32)
CREATININE: 0.78 mg/dL (ref 0.50–1.10)
Chloride: 103 mEq/L (ref 96–112)
Glucose, Bld: 101 mg/dL — ABNORMAL HIGH (ref 70–99)
Potassium: 4.1 mEq/L (ref 3.7–5.3)
Sodium: 139 mEq/L (ref 137–147)
TOTAL PROTEIN: 7.5 g/dL (ref 6.0–8.3)
Total Bilirubin: <0.2 mg/dL — ABNORMAL LOW (ref 0.3–1.2)

## 2014-01-08 LAB — CBC WITH DIFFERENTIAL/PLATELET
BASOS PCT: 0 % (ref 0–1)
Basophils Absolute: 0 10*3/uL (ref 0.0–0.1)
EOS ABS: 0.1 10*3/uL (ref 0.0–0.7)
Eosinophils Relative: 2 % (ref 0–5)
HCT: 32.1 % — ABNORMAL LOW (ref 36.0–46.0)
HEMOGLOBIN: 9.4 g/dL — AB (ref 12.0–15.0)
Lymphocytes Relative: 52 % — ABNORMAL HIGH (ref 12–46)
Lymphs Abs: 3.3 10*3/uL (ref 0.7–4.0)
MCH: 19.7 pg — AB (ref 26.0–34.0)
MCHC: 29.3 g/dL — AB (ref 30.0–36.0)
MCV: 67.3 fL — ABNORMAL LOW (ref 78.0–100.0)
MONOS PCT: 6 % (ref 3–12)
Monocytes Absolute: 0.4 10*3/uL (ref 0.1–1.0)
NEUTROS ABS: 2.5 10*3/uL (ref 1.7–7.7)
NEUTROS PCT: 40 % — AB (ref 43–77)
PLATELETS: 330 10*3/uL (ref 150–400)
RBC: 4.77 MIL/uL (ref 3.87–5.11)
RDW: 20.7 % — ABNORMAL HIGH (ref 11.5–15.5)
WBC: 6.3 10*3/uL (ref 4.0–10.5)

## 2014-01-08 LAB — CBC
HCT: 29.8 % — ABNORMAL LOW (ref 36.0–46.0)
Hemoglobin: 9.1 g/dL — ABNORMAL LOW (ref 12.0–15.0)
MCH: 20.2 pg — AB (ref 26.0–34.0)
MCHC: 30.5 g/dL (ref 30.0–36.0)
MCV: 66.1 fL — AB (ref 78.0–100.0)
Platelets: 297 10*3/uL (ref 150–400)
RBC: 4.51 MIL/uL (ref 3.87–5.11)
RDW: 19.2 % — ABNORMAL HIGH (ref 11.5–15.5)
WBC: 5.8 10*3/uL (ref 4.0–10.5)

## 2014-01-08 LAB — LIPID PANEL
CHOL/HDL RATIO: 3.5 ratio
CHOLESTEROL: 210 mg/dL — AB (ref 0–200)
HDL: 60 mg/dL (ref >39)
LDL Cholesterol: 118 mg/dL — ABNORMAL HIGH (ref 0–99)
Triglycerides: 162 mg/dL — ABNORMAL HIGH (ref <150)
VLDL: 32 mg/dL (ref 0–40)

## 2014-01-08 LAB — VITAMIN D 25 HYDROXY (VIT D DEFICIENCY, FRACTURES): Vit D, 25-Hydroxy: 18 ng/mL — ABNORMAL LOW (ref 30–89)

## 2014-01-08 LAB — TSH: TSH: 5.82 u[IU]/mL — ABNORMAL HIGH (ref 0.350–4.500)

## 2014-01-08 LAB — TROPONIN I: Troponin I: <0.3 ng/mL (ref <0.30)

## 2014-01-08 MED ORDER — HYDROCODONE-ACETAMINOPHEN 5-325 MG PO TABS: 1.0000 | ORAL_TABLET | Freq: Four times a day (QID) | ORAL | Status: DC | PRN

## 2014-01-08 MED ORDER — OXYCODONE-ACETAMINOPHEN 5-325 MG PO TABS
2.0000 | ORAL_TABLET | Freq: Once | ORAL | Status: AC
  Administered 2014-01-08: 2 via ORAL
  Filled 2014-01-08: qty 2

## 2014-01-08 MED ORDER — PREDNISONE 20 MG PO TABS: ORAL_TABLET | ORAL | Status: DC

## 2014-01-08 MED ORDER — METHOCARBAMOL 500 MG PO TABS
1000.0000 mg | ORAL_TABLET | Freq: Once | ORAL | Status: AC
  Administered 2014-01-08: 1000 mg via ORAL
  Filled 2014-01-08: qty 2

## 2014-01-08 MED ORDER — PREDNISONE 20 MG PO TABS
60.0000 mg | ORAL_TABLET | Freq: Once | ORAL | Status: AC
  Administered 2014-01-08: 60 mg via ORAL
  Filled 2014-01-08: qty 3

## 2014-01-08 MED ORDER — ONDANSETRON 4 MG PO TBDP: 8.0000 mg | ORAL_TABLET | Freq: Once | ORAL | Status: DC

## 2014-01-08 NOTE — ED Notes (Signed)
Pt reporting chest tightness since come to room with no radiation.  Pt anxious during assessment.

## 2014-01-08 NOTE — Telephone Encounter (Signed)
Patient has gone to ED to be seen

## 2014-01-08 NOTE — Telephone Encounter (Signed)
Pt has come in today to see if she can get some advice on her arm pain; pt was informed that we don't have any walk-in Appt. Available at this time; pt would like to know if she needs to wait for medication to set in or go to the [facility]; please advise pt

## 2014-01-08 NOTE — ED Notes (Signed)
Pt states 1 week ago had some neck pain that resolved and woke up out of sleep with right arm pain that has now moved up to right shoulder, was seen at urgent care and given medication.  Now the pain is up into her right neck and thinks it is in the back of her head.  Denies chest pain.  Pt is tearful.  Pt states arm hurts with moving.  Pt has history of pinched nerve to right neck.  Pt feels like it is the same pain.

## 2014-01-08 NOTE — Discharge Instructions (Signed)
For neck pain/radiating arm pain, take prednisone as prescribed. You may take hydrocodone as need for pain. Take with food. No driving for the next 6 hours or when taking hydrocodone. Also, do not take tylenol or acetaminophen containing medication when taking hydrocodone.  For chest discomfort, follow up with cardiologist in the next 1-2 weeks. No smoking.   Return to ER if worse, intractable pain, arm numbness/weakness, fevers, recurrent or persistent chest pain, trouble breathing, other concern.     Degenerative Disk Disease Degenerative disk disease is a condition caused by the changes that occur in the cushions of the backbone (spinal disks) as you grow older. Spinal disks are soft and compressible disks located between the bones of the spine (vertebrae). They act like shock absorbers. Degenerative disk disease can affect the whole spine. However, the neck and lower back are most commonly affected. Many changes can occur in the spinal disks with aging, such as:  The spinal disks may dry and shrink.  Small tears may occur in the tough, outer covering of the disk (annulus).  The disk space may become smaller due to loss of water.  Abnormal growths in the bone (spurs) may occur. This can put pressure on the nerve roots exiting the spinal canal, causing pain.  The spinal canal may become narrowed. CAUSES  Degenerative disk disease is a condition caused by the changes that occur in the spinal disks with aging. The exact cause is not known, but there is a genetic basis for many patients. Degenerative changes can occur due to loss of fluid in the disk. This makes the disk thinner and reduces the space between the backbones. Small cracks can develop in the outer layer of the disk. This can lead to the breakdown of the disk. You are more likely to get degenerative disk disease if you are overweight. Smoking cigarettes and doing heavy work such as weightlifting can also increase your risk of this  condition. Degenerative changes can start after a sudden injury. Growth of bone spurs can compress the nerve roots and cause pain.  SYMPTOMS  The symptoms vary from person to person. Some people may have no pain, while others have severe pain. The pain may be so severe that it can limit your activities. The location of the pain depends on the part of your backbone that is affected. You will have neck or arm pain if a disk in the neck area is affected. You will have pain in your back, buttocks, or legs if a disk in the lower back is affected. The pain becomes worse while bending, reaching up, or with twisting movements. The pain may start gradually and then get worse as time passes. It may also start after a major or minor injury. You may feel numbness or tingling in the arms or legs.  DIAGNOSIS  Your caregiver will ask you about your symptoms and about activities or habits that may cause the pain. He or she may also ask about any injuries, diseases, or treatments you have had earlier. Your caregiver will examine you to check for the range of movement that is possible in the affected area, to check for strength in your extremities, and to check for sensation in the areas of the arms and legs supplied by different nerve roots. An X-ray of the spine may be taken. Your caregiver may suggest other imaging tests, such as magnetic resonance imaging (MRI), if needed.  TREATMENT  Treatment includes rest, modifying your activities, and applying ice and heat.  Your caregiver may prescribe medicines to reduce your pain and may ask you to do some exercises to strengthen your back. In some cases, you may need surgery. You and your caregiver will decide on the treatment that is best for you. HOME CARE INSTRUCTIONS   Follow proper lifting and walking techniques as advised by your caregiver.  Maintain good posture.  Exercise regularly as advised.  Perform relaxation exercises.  Change your sitting, standing, and  sleeping habits as advised. Change positions frequently.  Lose weight as advised.  Stop smoking if you smoke.  Wear supportive footwear. SEEK MEDICAL CARE IF:  Your pain does not go away within 1 to 4 weeks. SEEK IMMEDIATE MEDICAL CARE IF:   Your pain is severe.  You notice weakness in your arms, hands, or legs.  You begin to lose control of your bladder or bowel movements. MAKE SURE YOU:   Understand these instructions.  Will watch your condition.  Will get help right away if you are not doing well or get worse. Document Released: 08/22/2007 Document Revised: 01/17/2012 Document Reviewed: 08/22/2007 [facility] Patient Information 2014 [place].     Cervical Sprain A cervical sprain is when the tissues (ligaments) that hold the neck bones in place stretch or tear. HOME CARE   Put ice on the injured area.  Put ice in a plastic bag.  Place a towel between your skin and the bag.  Leave the ice on for 15 20 minutes, 3 4 times a day.  You may have been given a collar to wear. This collar keeps your neck from moving while you heal.  Do not take the collar off unless told by your doctor.  If you have long hair, keep it outside of the collar.  Ask your doctor before changing the position of your collar. You may need to change its position over time to make it more comfortable.  If you are allowed to take off the collar for cleaning or bathing, follow your doctor's instructions on how to do it safely.  Keep your collar clean by wiping it with mild soap and water. Dry it completely. If the collar has removable pads, remove them every 1 2 days to hand wash them with soap and water. Allow them to air dry. They should be dry before you wear them in the collar.  Do not drive while wearing the collar.  Only take medicine as told by your doctor.  Keep all doctor visits as told.  Keep all physical therapy visits as told.  Adjust your work station so that you have good  posture while you work.  Avoid positions and activities that make your problems worse.  Warm up and stretch before being active. GET HELP IF:  Your pain is not controlled with medicine.  You cannot take less pain medicine over time as planned.  Your activity level does not improve as expected. GET HELP RIGHT AWAY IF:   You are bleeding.  Your stomach is upset.  You have an allergic reaction to your medicine.  You develop new problems that you cannot explain.  You lose feeling (become numb) or you cannot move any part of your body (paralysis).  You have tingling or weakness in any part of your body.  Your symptoms get worse. Symptoms include:  Pain, soreness, stiffness, puffiness (swelling), or a burning feeling in your neck.  Pain when your neck is touched.  Shoulder or upper back pain.  Limited ability to move your neck.  Headache.  Dizziness.  Your hands or arms feel week, lose feeling, or tingle.  Muscle spasms.  Difficulty swallowing or chewing. MAKE SURE YOU:   Understand these instructions.  Will watch your condition.  Will get help right away if you are not doing well or get worse. Document Released: 04/12/2008 Document Revised: 06/27/2013 Document Reviewed: 05/02/2013 [facility] Patient Information 2014 [place].    Chest Pain (Nonspecific) It is often hard to give a specific diagnosis for the cause of chest pain. There is always a chance that your pain could be related to something serious, such as a heart attack or a blood clot in the lungs. You need to follow up with your caregiver for further evaluation. CAUSES   Heartburn.  Pneumonia or bronchitis.  Anxiety or stress.  Inflammation around your heart (pericarditis) or lung (pleuritis or pleurisy).  A blood clot in the lung.  A collapsed lung (pneumothorax). It can develop suddenly on its own (spontaneous pneumothorax) or from injury (trauma) to the chest.  Shingles infection  (herpes zoster virus). The chest wall is composed of bones, muscles, and cartilage. Any of these can be the source of the pain.  The bones can be bruised by injury.  The muscles or cartilage can be strained by coughing or overwork.  The cartilage can be affected by inflammation and become sore (costochondritis). DIAGNOSIS  Lab tests or other studies, such as X-rays, electrocardiography, stress testing, or cardiac imaging, may be needed to find the cause of your pain.  TREATMENT   Treatment depends on what may be causing your chest pain. Treatment may include:  Acid blockers for heartburn.  Anti-inflammatory medicine.  Pain medicine for inflammatory conditions.  Antibiotics if an infection is present.  You may be advised to change lifestyle habits. This includes stopping smoking and avoiding alcohol, caffeine, and chocolate.  You may be advised to keep your head raised (elevated) when sleeping. This reduces the chance of acid going backward from your stomach into your esophagus.  Most of the time, nonspecific chest pain will improve within 2 to 3 days with rest and mild pain medicine. HOME CARE INSTRUCTIONS   If antibiotics were prescribed, take your antibiotics as directed. Finish them even if you start to feel better.  For the next few days, avoid physical activities that bring on chest pain. Continue physical activities as directed.  Do not smoke.  Avoid drinking alcohol.  Only take over-the-counter or prescription medicine for pain, discomfort, or fever as directed by your caregiver.  Follow your caregiver's suggestions for further testing if your chest pain does not go away.  Keep any follow-up appointments you made. If you do not go to an appointment, you could develop lasting (chronic) problems with pain. If there is any problem keeping an appointment, you must call to reschedule. SEEK MEDICAL CARE IF:   You think you are having problems from the medicine you are  taking. Read your medicine instructions carefully.  Your chest pain does not go away, even after treatment.  You develop a rash with blisters on your chest. SEEK IMMEDIATE MEDICAL CARE IF:   You have increased chest pain or pain that spreads to your arm, neck, jaw, back, or abdomen.  You develop shortness of breath, an increasing cough, or you are coughing up blood.  You have severe back or abdominal pain, feel nauseous, or vomit.  You develop severe weakness, fainting, or chills.  You have a fever. THIS IS AN EMERGENCY. Do not wait to  see if the pain will go away. Get medical help at once. Call your local emergency services (911 in [place] Do not drive yourself to the hospital. MAKE SURE YOU:   Understand these instructions.  Will watch your condition.  Will get help right away if you are not doing well or get worse. Document Released: 08/04/2005 Document Revised: 01/17/2012 Document Reviewed: 05/30/2008 [facility] Patient Information 2014 [place].

## 2014-01-08 NOTE — Progress Notes (Signed)
Pt came in today requesting pain medication. She was seen in the urgent care and was prescribed tramadol yesterday. Today I told her that there is not much more that we could do. I told her that she needs to see the doctor so he could order an x ray. I informed her that the best we could do is refill her tramadol. She became rude and said that she was going to the ER so she could get help.

## 2014-01-08 NOTE — ED Notes (Signed)
Pt throwing up at discharge. 8mg  of Zofran ODT ordered.

## 2014-01-08 NOTE — ED Provider Notes (Signed)
CSN: [id number]     Arrival date & time 01/08/14  1026 History   First MD Initiated Contact with Patient 01/08/14 1146     Chief Complaint  Patient presents with  . Neck Pain     (Consider location/radiation/quality/duration/timing/severity/associated sxs/prior Treatment) The history is provided by the patient.  pt c/o right neck pain that radiates to right arm in the past 2-3 days. Pain moderate, constant. No specific exacerbating or alleviating factors. No associated numbness, weakness, or loss of normal function of right arm or hand. Hx ddd. States remote hx similar symptoms on left side. Denies injury or strain. No headache. No fever or chills. Also notes mid chest tightness/pain in past day, midline, constant. At rest. No change w activity or exertion. No tearing or ripping pain, no radiation, no back pain. No associated nv, diaphoresis, sob or unusual fatigue. No hx heart disease. No pleuritic pain. No leg pain or swelling. No immobility, trauma, travel or surgery. Smoker. No fam hx cad.      Past Medical History  Diagnosis Date  . Stroke 05/2013  . History of TIAs    Past Surgical History  Procedure Laterality Date  . Cesarean section      x 1 with 5th pregnancy  . Cholecystectomy    . Tee without cardioversion N/A 05/21/2013    Procedure: TRANSESOPHAGEAL ECHOCARDIOGRAM (TEE);  Surgeon: Sueanne Margarita, MD;  Location: [facility] ENDOSCOPY;  Service: Cardiovascular;  Laterality: N/A;   Family History  Problem Relation Age of Onset  . Renal Disease Father     dialysis  . Hypertension Father   . Heart disease Mother     stabbed in heart   History  Substance Use Topics  . Smoking status: Current Every Day Smoker -- 0.50 packs/day for 28 years  . Smokeless tobacco: Never Used     Comment: tried nothing for cessation  . Alcohol Use: Yes     Comment: occasional   OB History   Grav Para Term Preterm Abortions TAB SAB Ect Mult Living                 Review of Systems   Constitutional: Negative for fever and chills.  HENT: Negative for sore throat.   Eyes: Negative for redness.  Respiratory: Negative for cough and shortness of breath.   Cardiovascular: Positive for chest pain. Negative for palpitations and leg swelling.  Gastrointestinal: Negative for vomiting, abdominal pain and diarrhea.  Genitourinary: Negative for flank pain.  Musculoskeletal: Positive for neck pain. Negative for back pain.  Skin: Negative for rash.  Neurological: Negative for weakness, numbness and headaches.  Hematological: Does not bruise/bleed easily.  Psychiatric/Behavioral: Negative for confusion.      Allergies  Shellfish allergy  Home Medications   Current Outpatient Rx  Name  Route  Sig  Dispense  Refill  . acetaminophen (TYLENOL) 325 MG tablet   Oral   Take 650 mg by mouth every 6 (six) hours as needed for mild pain.         Marland Kitchen atorvastatin (LIPITOR) 40 MG tablet   Oral   Take 1 tablet (40 mg total) by mouth daily at 6 PM.   30 tablet   6   . clopidogrel (PLAVIX) 75 MG tablet   Oral   Take 1 tablet (75 mg total) by mouth daily with breakfast.   30 tablet   12   . gabapentin (NEURONTIN) 100 MG capsule   Oral   Take 1 capsule (100 mg  total) by mouth 3 (three) times daily.   90 capsule   5   . traMADol (ULTRAM) 50 MG tablet   Oral   Take 50 mg by mouth every 6 (six) hours as needed for moderate pain.         Marland Kitchen zolpidem (AMBIEN) 5 MG tablet   Oral   Take 1 tablet (5 mg total) by mouth at bedtime as needed for sleep.   30 tablet   0    BP 125/88  Pulse 92  Temp(Src) 98.2 F (36.8 C) (Oral)  Resp 22  Wt 206 lb (93.441 kg)  SpO2 100%  LMP 01/06/2014 Physical Exam  Nursing note and vitals reviewed. Constitutional: She is oriented to person, place, and time. She appears well-developed and well-nourished. No distress.  HENT:  Mouth/Throat: Oropharynx is clear and moist.  Eyes: Conjunctivae are normal. No scleral icterus.  Neck: Normal  range of motion. Neck supple. No tracheal deviation present. No thyromegaly present.  Cardiovascular: Normal rate, regular rhythm, normal heart sounds and intact distal pulses.  Exam reveals no gallop and no friction rub.   No murmur heard. Pulmonary/Chest: Effort normal and breath sounds normal. No respiratory distress. She exhibits tenderness.  Abdominal: Soft. Normal appearance and bowel sounds are normal. She exhibits no distension and no mass. There is no tenderness. There is no rebound and no guarding.  Musculoskeletal: Normal range of motion. She exhibits no edema and no tenderness.  CTLS spine, non tender, aligned, no step off. Good rom neck and right shoulder without pain. Radial pulse 2+.  No calf/leg pain, swelling, or tenderness  Lymphadenopathy:    She has no cervical adenopathy.  Neurological: She is alert and oriented to person, place, and time.  Motor intact bil. Steady gait.  No pronator drift. Equal grip, stre 5/5. Right m/r/u nerve fxn intact.   Skin: Skin is warm and dry. No rash noted.  Psychiatric: She has a normal mood and affect.    ED Course  Procedures (including critical care time)   Results for orders placed during the hospital encounter of 01/08/14  CBC      Result Value Ref Range   WBC 5.8  4.0 - 10.5 K/uL   RBC 4.51  3.87 - 5.11 MIL/uL   Hemoglobin 9.1 (*) 12.0 - 15.0 g/dL   HCT 29.8 (*) 36.0 - 46.0 %   MCV 66.1 (*) 78.0 - 100.0 fL   MCH 20.2 (*) 26.0 - 34.0 pg   MCHC 30.5  30.0 - 36.0 g/dL   RDW 19.2 (*) 11.5 - 15.5 %   Platelets 297  150 - 400 K/uL  COMPREHENSIVE METABOLIC PANEL      Result Value Ref Range   Sodium 139  137 - 147 mEq/L   Potassium 4.1  3.7 - 5.3 mEq/L   Chloride 103  96 - 112 mEq/L   CO2 25  19 - 32 mEq/L   Glucose, Bld 101 (*) 70 - 99 mg/dL   BUN 6  6 - 23 mg/dL   Creatinine, Ser 0.78  0.50 - 1.10 mg/dL   Calcium 8.8  8.4 - 10.5 mg/dL   Total Protein 7.5  6.0 - 8.3 g/dL   Albumin 3.7  3.5 - 5.2 g/dL   AST 17  0 - 37 U/L    ALT 9  0 - 35 U/L   Alkaline Phosphatase 73  39 - 117 U/L   Total Bilirubin <0.2 (*) 0.3 - 1.2 mg/dL   GFR  calc non Af Amer >90  >90 mL/min   GFR calc Af Amer >90  >90 mL/min  TROPONIN I      Result Value Ref Range   Troponin I <0.30  <0.30 ng/mL       EKG Interpretation   Date/Time:  Tuesday January 08 2014 11:04:32 EST Ventricular Rate:  80 PR Interval:  134 QRS Duration: 72 QT Interval:  388 QTC Calculation: 447 R Axis:   63 Text Interpretation:  Normal sinus rhythm with sinus arrhythmia  Nonspecific ST abnormality No significant change since last tracing  Confirmed by Ashok Cordia  MD, Lennette Bihari ([id number]) on 01/08/2014 11:43:37 AM      MDM  Labs.   Reviewed nursing notes and prior charts for additional history.    ?radicular arm pain, hx ddd. pred po. C/o muscle pain/spasm. Robaxin po.   Pt requests pain med. Percocet po.  Recheck pt comfortable.  [facility] states she left there earlier today as  They wouldn't help her, discussed referral to specialist follow up.   pts main c/o right neck/radicular pain. Will rx pred, pain rx, and refer to follow up. Neuro exam normal. Pt stable for d/c.  For chest symptoms > 24 hrs, constant, trop neg. Chest cta, w mild reproducible chest wall tenderness, appearing most c/w musculoskeletal pain.    Mirna Mires, MD 01/08/14 [phone]

## 2014-01-09 MED ORDER — FERROUS SULFATE 325 (65 FE) MG PO TABS: 325.0000 mg | ORAL_TABLET | Freq: Two times a day (BID) | ORAL | Status: DC

## 2014-01-09 MED ORDER — ERGOCALCIFEROL 1.25 MG (50000 UT) PO CAPS: 50000.0000 [IU] | ORAL_CAPSULE | ORAL | Status: DC

## 2014-01-09 NOTE — ED Provider Notes (Signed)
Medical screening examination/treatment/procedure(s) were performed by a resident physician or non-physician practitioner and as the supervising physician I was immediately available for consultation/collaboration.  Lynne Leader, MD   Gregor Hams, MD 01/09/14 [phone]

## 2014-01-09 NOTE — Addendum Note (Signed)
Addended by: Allyson Sabal MD, Ascencion Dike on: 01/09/2014 11:32 AM   Modules accepted: Orders

## 2014-01-10 ENCOUNTER — Telehealth: Payer: Self-pay | Admitting: *Deleted

## 2014-01-10 NOTE — Telephone Encounter (Signed)
Message copied by Rilda Bulls, [place] R on Thu Jan 10, 2014  2:49 PM ------      Message from: Allyson Sabal MD, Ascencion Dike      Created: Wed Jan 09, 2014 11:34 AM       Notify patient of the patient's hemoglobin is 9.4, she has severe microcytic anemia. I have ordered an anemia panel to be drawn this week. Patient has been prescribed ferrous sulfate already. Her vitamin D level is also low, she has been prescribed vitamin D. Prescriptions have been sent to the clinic pharmacy.       ------

## 2014-01-10 NOTE — Telephone Encounter (Signed)
Notified patient of her lab results. Notified patient that her prescriptions were sent to our pharmacy. Scheduled patient for a lab for anemia panel 01/11/14 at 11:30am. Call completed as followed.

## 2014-01-11 ENCOUNTER — Encounter: Payer: Self-pay | Admitting: Family Medicine

## 2014-01-11 ENCOUNTER — Ambulatory Visit
Admission: RE | Admit: 2014-01-11 | Discharge: 2014-01-11 | Disposition: A | Discharge status: Home / Self Care | Payer: BC Managed Care – PPO | Source: Ambulatory Visit | Attending: Family Medicine | Admitting: Family Medicine

## 2014-01-11 ENCOUNTER — Ambulatory Visit: Payer: BC Managed Care – PPO | Attending: Internal Medicine

## 2014-01-11 ENCOUNTER — Ambulatory Visit (INDEPENDENT_AMBULATORY_CARE_PROVIDER_SITE_OTHER): Payer: BC Managed Care – PPO | Admitting: Family Medicine

## 2014-01-11 VITALS — BP 127/90 | Ht 64.0 in | Wt 206.0 lb

## 2014-01-11 DIAGNOSIS — F5105 Insomnia due to other mental disorder: Secondary | ICD-10-CM

## 2014-01-11 DIAGNOSIS — IMO0002 Reserved for concepts with insufficient information to code with codable children: Secondary | ICD-10-CM

## 2014-01-11 DIAGNOSIS — M792 Neuralgia and neuritis, unspecified: Secondary | ICD-10-CM | POA: Insufficient documentation

## 2014-01-11 DIAGNOSIS — I639 Cerebral infarction, unspecified: Secondary | ICD-10-CM

## 2014-01-11 DIAGNOSIS — M79601 Pain in right arm: Secondary | ICD-10-CM

## 2014-01-11 DIAGNOSIS — F409 Phobic anxiety disorder, unspecified: Secondary | ICD-10-CM

## 2014-01-11 MED ORDER — HYDROCODONE-ACETAMINOPHEN 5-325 MG PO TABS: 2.0000 | ORAL_TABLET | Freq: Three times a day (TID) | ORAL | Status: DC

## 2014-01-11 MED ORDER — GABAPENTIN 100 MG PO CAPS: ORAL_CAPSULE | ORAL | Status: DC

## 2014-01-11 NOTE — Progress Notes (Signed)
   Subjective:    Patient ID: [name], female    DOB: 1971-03-28, 43 y.o.   MRN: [id number]  HPI  6-7 days of right-sided neck pain with radiation to the right arm. No specific inciting event that she is aware of. In the past she has had some similar symptoms on the left but they were not this severe and were mostly in the left arm. For those she ultimately ended up getting some type of neck injection a couple of years ago and that went away. The symptoms are more severe 7-9/10. She has only mild relief with taking 2 Vicodin every 8 hours. She can get some relief by tilting her head to the left. She holds her head upright she has increased numbness tingling and pain in her right arm radiating all the way down to the fingertips particularly the index and long finger. She's been unable to sleep well for the last 2 night secondary to pain. She's not been able to go to work. She works as a [employer]ar[employer] manager.  Review of Systems Denies fever, sweats, chills, unusual weight change. No blurry vision. No headache. No difficulty opening her mouth, no problems with swallowing or speaking.    Objective:   Physical Exam  Vital signs are reviewed GENERAL: Well-developed female no acute distress but she is holding her head tilted to the left side. Neck is supple without lymphadenopathy. Cervical vertebra are nontender to percussion. Spurling's very positive on the right. Extremity: Triceps weakness on the right compared to the left. Otherwise her strength is symmetrical. DTRs at the elbow and forearm 2+ bilaterally symmetrical. 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     Assessment & Plan:

## 2014-01-11 NOTE — Patient Instructions (Addendum)
Take by tablet by mouth Day 1-3:   take one at night  Day 4-6:  Take 2 at night Day 7-10:  take 3 at night Day 11-14:  3 at night, 1 in am Day 15-18:   3 at night, 2 in am Day 19-21: 3 at night, 3 in am Day 22-25: 3 at night, 3 in am, 2 at lunch Day 26 forward : 3 at night, 3 in am, 3 at lunch   MRI prior authorization number is [id number]

## 2014-01-11 NOTE — Assessment & Plan Note (Signed)
Think she has a fairly acute herniated disc on the right. Looking at her old MRI she does not have a lot of extra canal space to work with. We'll rampup her gabapentin fairly quickly. I'll give her short-term narcotics. We will get MRI soon as possible. She will call immediately she has never worsening symptoms. I'll see her back right after the MRI which is in a week.

## 2014-01-12 LAB — ANEMIA PANEL
%SAT: 4 % — ABNORMAL LOW (ref 20–55)
ABS Retic: 66.5 10*3/uL (ref 19.0–186.0)
FERRITIN: 1 ng/mL — AB (ref 10–291)
FOLATE: 8.5 ng/mL
IRON: 18 ug/dL — AB (ref 42–145)
RBC.: 4.75 MIL/uL (ref 3.87–5.11)
RETIC CT PCT: 1.4 % (ref 0.4–2.3)
TIBC: 454 ug/dL (ref 250–470)
UIBC: 436 ug/dL — ABNORMAL HIGH (ref 125–400)
VITAMIN B 12: 627 pg/mL (ref 211–911)

## 2014-01-18 ENCOUNTER — Ambulatory Visit
Admission: RE | Admit: 2014-01-18 | Discharge: 2014-01-18 | Disposition: A | Discharge status: Home / Self Care | Payer: BC Managed Care – PPO | Source: Ambulatory Visit | Attending: Family Medicine | Admitting: Family Medicine

## 2014-01-18 ENCOUNTER — Other Ambulatory Visit: Payer: Self-pay | Admitting: Neurology

## 2014-01-18 ENCOUNTER — Ambulatory Visit: Payer: BC Managed Care – PPO | Admitting: Family Medicine

## 2014-01-18 DIAGNOSIS — M792 Neuralgia and neuritis, unspecified: Secondary | ICD-10-CM

## 2014-01-18 DIAGNOSIS — I6350 Cerebral infarction due to unspecified occlusion or stenosis of unspecified cerebral artery: Secondary | ICD-10-CM

## 2014-01-21 ENCOUNTER — Encounter: Payer: Self-pay | Admitting: Family Medicine

## 2014-01-21 ENCOUNTER — Ambulatory Visit (INDEPENDENT_AMBULATORY_CARE_PROVIDER_SITE_OTHER): Payer: BC Managed Care – PPO | Admitting: Family Medicine

## 2014-01-21 VITALS — BP 126/93 | HR 114 | Ht 64.0 in | Wt 206.0 lb

## 2014-01-21 DIAGNOSIS — IMO0002 Reserved for concepts with insufficient information to code with codable children: Secondary | ICD-10-CM

## 2014-01-21 DIAGNOSIS — M542 Cervicalgia: Secondary | ICD-10-CM

## 2014-01-21 DIAGNOSIS — M792 Neuralgia and neuritis, unspecified: Secondary | ICD-10-CM

## 2014-01-21 MED ORDER — GABAPENTIN 300 MG PO CAPS: ORAL_CAPSULE | ORAL | Status: DC

## 2014-01-21 MED ORDER — OXYCODONE-ACETAMINOPHEN 10-325 MG PO TABS: 1.0000 | ORAL_TABLET | ORAL | Status: DC | PRN

## 2014-01-21 NOTE — Patient Instructions (Addendum)
Use percocet as needed for pain. See neurosurgeons as scheduled.   For your gabapentin: Take by tablet by mouth Day 1-5:  Take 2 pill at night, 1 pill in AM, 1 pill at lunch Day 6-10: Take 2 pill at night, 1 pill in AM, 2 pill at lunch Day 11-15:  Take 2 pill at night, 2 pill in AM, 2 pill at lunch Day 16-20:   Take 3 pill at night, 2 pill in AM, 2 pill at lunch Day 21-25: Take 3 pill at night, 2 pill in AM, 3 pill at lunch Day 26 and forward-25: Take 3 pill at night, 3 pill in AM, 3 pill at lunch

## 2014-01-21 NOTE — Progress Notes (Signed)
   Subjective:    Patient ID: [name], female    DOB: 1971-10-25, 43 y.o.   MRN: [id number]  HPI Followup right neck pain, right upper arm pain. Not much better. Pain medicine doesn't seem to be helping at all. She has successfully tapered up to 900 mg of gabapentin and that is perhaps beginning to help a little bit. Still having numbness in her arm if she tilts her head to the right at all. Having difficulty sleeping. Was able to get the MRI.   Review of Systems No fever, sweats, chills.    Objective:   Physical Exam  Vital signs are reviewed GENERAL: Well-developed female no acute distress NECK: If she keeps her neck totally straight she has right arm numbness in the she laterally [place] that side it increases. If she bends to the left side she has some relief of her right arm radicular symptoms. MSK: Shoulder shrug 4-5/5 on the right 5 out of 5 on the left. Deltoid 4-5 out of 5 on the right, 5 out of 5 on the left.?  Mild triceps weakness on the right.  Biceps  Strength, Wrist flexion and  Extension strength normal,  and hand intrinsics are normal.   NEURO: DTRs are 2+ at the elbow and forearm symmetrical.   MRI: I reviewed the images with her. I think there is some disc material encroaching on the C5 nerve root area. I agree with the flattening of the cord at C4-5. Also noted is the uncovertebral joint hypertrophy at C5-6.    Assessment & Plan:  Right radiculopathy. Certainly her clinical symptoms are consistent with acute radiculopathy in the C5-6 area. I will continue to taper her up fairly rapidly with the gabapentin. We had tried a prednisone burst which was unhelpful. The narcotic pain medicine has not been helpful.   We'll try to get her in to see neurosurgery for evaluation as soon as possible. Iam currently keeping her out of work until we get her into see the neurosurgeon.   We'll also like him to explore options of epidural steroid injection or nerve block. She's  previously had I nerve block many years ago which was very successful for her and she would be very open to this type of procedure as well. She is now looking for to have neck surgery but at this point she's not able to do her job or her activities of daily living without pretty significant pain. We'll also give her a short prescription of different pain medication. I think most important thing is to taper her up on the Neurontin.

## 2014-01-25 ENCOUNTER — Other Ambulatory Visit: Payer: Self-pay

## 2014-01-25 ENCOUNTER — Ambulatory Visit: Payer: Self-pay | Admitting: Neurology

## 2014-01-29 ENCOUNTER — Other Ambulatory Visit: Payer: Self-pay | Admitting: Neurological Surgery

## 2014-01-29 DIAGNOSIS — M5412 Radiculopathy, cervical region: Secondary | ICD-10-CM | POA: Insufficient documentation

## 2014-01-29 DIAGNOSIS — M5090 Cervical disc disorder, unspecified, unspecified cervical region: Secondary | ICD-10-CM

## 2014-01-29 DIAGNOSIS — M5410 Radiculopathy, site unspecified: Secondary | ICD-10-CM

## 2014-01-30 ENCOUNTER — Ambulatory Visit
Admission: RE | Admit: 2014-01-30 | Discharge: 2014-01-30 | Disposition: A | Discharge status: Home / Self Care | Payer: BC Managed Care – PPO | Source: Ambulatory Visit | Attending: Neurological Surgery | Admitting: Neurological Surgery

## 2014-01-30 DIAGNOSIS — M5410 Radiculopathy, site unspecified: Secondary | ICD-10-CM

## 2014-01-30 DIAGNOSIS — M5090 Cervical disc disorder, unspecified, unspecified cervical region: Secondary | ICD-10-CM

## 2014-01-30 MED ORDER — TRIAMCINOLONE ACETONIDE 40 MG/ML IJ SUSP (RADIOLOGY)
60.0000 mg | Freq: Once | INTRAMUSCULAR | Status: AC
  Administered 2014-01-30: 60 mg via EPIDURAL

## 2014-01-30 MED ORDER — IOHEXOL 300 MG/ML  SOLN
1.0000 mL | Freq: Once | INTRAMUSCULAR | Status: AC | PRN
  Administered 2014-01-30: 1 mL via EPIDURAL

## 2014-01-30 NOTE — Discharge Instructions (Signed)

## 2014-02-04 ENCOUNTER — Other Ambulatory Visit: Payer: Self-pay | Admitting: Internal Medicine

## 2014-02-04 ENCOUNTER — Encounter: Payer: Self-pay | Admitting: *Deleted

## 2014-02-06 ENCOUNTER — Telehealth: Payer: Self-pay | Admitting: Emergency Medicine

## 2014-02-06 NOTE — Telephone Encounter (Signed)
Pt requesting refill ambien. Please f/u

## 2014-02-15 ENCOUNTER — Telehealth: Payer: Self-pay | Admitting: Emergency Medicine

## 2014-02-15 ENCOUNTER — Other Ambulatory Visit: Payer: Self-pay | Admitting: Family Medicine

## 2014-02-15 ENCOUNTER — Other Ambulatory Visit: Payer: Self-pay | Admitting: Internal Medicine

## 2014-02-15 NOTE — Telephone Encounter (Signed)
Pt requesting refill Ambien. Please f/u

## 2014-02-22 ENCOUNTER — Ambulatory Visit (INDEPENDENT_AMBULATORY_CARE_PROVIDER_SITE_OTHER): Payer: Self-pay

## 2014-02-22 ENCOUNTER — Telehealth: Payer: Self-pay | Admitting: Internal Medicine

## 2014-02-22 ENCOUNTER — Ambulatory Visit (INDEPENDENT_AMBULATORY_CARE_PROVIDER_SITE_OTHER): Payer: BC Managed Care – PPO | Admitting: Neurology

## 2014-02-22 ENCOUNTER — Encounter: Payer: Self-pay | Admitting: Neurology

## 2014-02-22 VITALS — BP 111/81 | HR 85 | Ht 64.0 in | Wt 206.0 lb

## 2014-02-22 DIAGNOSIS — I6350 Cerebral infarction due to unspecified occlusion or stenosis of unspecified cerebral artery: Secondary | ICD-10-CM

## 2014-02-22 DIAGNOSIS — Z0289 Encounter for other administrative examinations: Secondary | ICD-10-CM

## 2014-02-22 DIAGNOSIS — E66812 Obesity, class 2: Secondary | ICD-10-CM | POA: Insufficient documentation

## 2014-02-22 DIAGNOSIS — E669 Obesity, unspecified: Secondary | ICD-10-CM

## 2014-02-22 DIAGNOSIS — Z6837 Body mass index (BMI) 37.0-37.9, adult: Secondary | ICD-10-CM | POA: Insufficient documentation

## 2014-02-22 NOTE — Progress Notes (Signed)
[facility] [address] [place]. [place] [address] [phone]       OFFICE FOLLOW-UP & TCD Bubble Study  NOTE  [name] Date of Birth:  1971/08/15 Medical Record Number:  [id number]   HPI: 20 year African American lady seen for her first office follow up visit following hospital admission in October 2014. She presented with transient right arm numbness lasting only a few minutes which occurred several times as well as some confusion. CT head was negative but MRI scan showed a small 4 mm acute infarct in the high left parietal lobe. She underwent evaluation which was fairly unrevealing. LDL cholesterol was elevated at 118. Hemoglobin A1c was 5.8. Carotid Dopplers showed no significant extraction stenosis. MRA brain showed no significant stenosis. Echocardiogram showed normal ejection fraction. EKG and telemetry monitoring showed regular sinus rhythm.Hypercoagulable panel and vasculitic  Labs were normal.TEE was normal without cardiac source of embolism. She had no prior history of recurrent miscarriages, DVTs or pulmonary embolism. There is no family history of strokes or heart attacks the young age. She states she's done well since discharge. She is brought in Plavix and Lipitor without side effects. She had initially quit smoking but has unfortunately restarted. She has not lost any weight and does not exercise regularly.  ROS:   14 system review of systems is positive for numbness and tingling only and all other systems negative  PMH:  Past Medical History  Diagnosis Date  . Stroke 05/2013  . History of TIAs     Social History:  History   Social History  . Marital Status: Divorced    Spouse Name: N/A    Number of Children: 7  . Years of Education: GEd   Occupational History  . CNA    Social History Main Topics  . Smoking status: Current Every Day Smoker -- 0.50 packs/day for 28 years  . Smokeless tobacco: Never Used     Comment: tried nothing  for cessation  . Alcohol Use: Yes     Comment: occasional  . Drug Use: No  . Sexual Activity: Not on file   Other Topics Concern  . Not on file   Social History Narrative   Patient lives at home with her family    Patient drinks soda    Medications:   Current Outpatient Prescriptions on File Prior to Visit  Medication Sig Dispense Refill  . acetaminophen (TYLENOL) 325 MG tablet Take 650 mg by mouth every 6 (six) hours as needed for mild pain.      Marland Kitchen atorvastatin (LIPITOR) 40 MG tablet Take 1 tablet (40 mg total) by mouth daily at 6 PM.  30 tablet  6  . clopidogrel (PLAVIX) 75 MG tablet Take 1 tablet (75 mg total) by mouth daily with breakfast.  30 tablet  12  . ergocalciferol (VITAMIN D2) 50000 UNITS capsule Take 1 capsule (50,000 Units total) by mouth once a week.  4 capsule  12  . ferrous sulfate 325 (65 FE) MG tablet Take 1 tablet (325 mg total) by mouth 2 (two) times daily with a meal.  60 tablet  3  . gabapentin (NEURONTIN) 300 MG capsule Patient has written taper up to 9 pills a day  270 capsule  5  . oxyCODONE-acetaminophen (PERCOCET) 10-325 MG per tablet Take 1 tablet by mouth every 4 (four) hours as needed for pain.  90 tablet  0  . zolpidem (AMBIEN) 5 MG tablet Take 1 tablet (5 mg total) by  mouth at bedtime as needed for sleep.  30 tablet  0   No current facility-administered medications on file prior to visit.    Allergies:   Allergies  Allergen Reactions  . Shellfish Allergy Itching and Swelling    Physical Exam General: Obese young African American lady seated, in no evident distress Head: head normocephalic and atraumatic. Orohparynx benign Neck: supple with no carotid or supraclavicular bruits Cardiovascular: regular rate and rhythm, no murmurs Musculoskeletal: no deformity Skin:  no rash/petichiae Vascular:  Normal pulses all extremities Filed Vitals:   02/22/14 1442  BP: 111/81  Pulse: 85   Neurologic Exam Mental Status: Awake and fully alert.  Oriented to place and time. Recent and remote memory intact. Attention span, concentration and fund of knowledge appropriate. Mood and affect appropriate.  Cranial Nerves: Fundoscopic exam not done   Pupils equal, briskly reactive to light. Extraocular movements full without nystagmus. Visual fields full to confrontation. Hearing intact. Facial sensation intact. Face, tongue, palate moves normally and symmetrically.  Motor: Normal bulk and tone. Normal strength in all tested extremity muscles. Sensory.: intact to touch and pinprick and vibratory sensation.  Coordination: Rapid alternating movements normal in all extremities. Finger-to-nose and heel-to-shin performed accurately bilaterally. Gait and Station: Arises from chair without difficulty. Stance is normal. Gait demonstrates normal stride length and balance . Able to heel, toe and tandem walk without difficulty.  Reflexes: 1+ and symmetric. Toes downgoing.   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 Risk factors of hyperlipidemia, smoking, obesity    PLAN: I had a long discussion with the patient and her daughter regarding her recent stroke, discuss results of hospital evaluation, secondary stroke prevention strategies and answered questions. Continue Plavix a second stroke prevention and strict control of lipids with LDL cholesterol goal below 100 mg percent. I counseled her to quit smoking again completely. She was also advised to diet, exercise regularly and lose weight. Return for followup in 6 months or call earlier if necessary. We'll check TCD bubble study today to evaluate for small PFO not visualized on TE        [facility] [place]. [place] [address]. [phone]       TRANSCRANIAL DOPPLER BUBBLE STUDY   [name] Date of Birth:  October 19, 1971 Medical Record  Number:  [id number]   Indications: Diagnostic Date of Procedure: 02/22/2014 Clinical History:  79 year lady with embolic stroke Technical Description:   Transcranial Doppler Bubble Study was performed at the bedside after taking written informed consent from the patient and explaining risk/benefits. Both middle cerebral arteries were insonated using a headset. And IV line was inserted in the left forearm by the RN using aseptic precautions. Agitated saline injection at rest and after valsalva maneuver did not result in high intensity transient signals (HITS).   Impression:  Negative Transcranial Doppler Bubble Study i not indicative of right to left intracardiac shunt.   Results were explained to the patient. Questions were answered.   Note: This document was prepared with digital dictation and possible smart phrase technology. Any transcriptional errors that result from this process are unintentional

## 2014-02-22 NOTE — Patient Instructions (Signed)
I had a long discussion with the patient and her daughter regarding her recent stroke, discuss results of hospital evaluation, secondary stroke prevention strategies and answered questions. Continue Plavix a second stroke prevention and strict control of lipids with LDL cholesterol goal below 100 mg percent. I counseled her to quit smoking again completely. She was also advised to diet, exercise regularly and lose weight. Return for followup in 6 months or call earlier if necessary.  DASH Diet The DASH diet stands for "Dietary Approaches to Stop Hypertension." It is a healthy eating plan that has been shown to reduce high blood pressure (hypertension) in as little as 14 days, while also possibly providing other significant health benefits. These other health benefits include reducing the risk of breast cancer after menopause and reducing the risk of type 2 diabetes, heart disease, colon cancer, and stroke. Health benefits also include weight loss and slowing kidney failure in patients with chronic kidney disease.  DIET GUIDELINES  Limit salt (sodium). Your diet should contain less than 1500 mg of sodium daily.  Limit refined or processed carbohydrates. Your diet should include mostly whole grains. Desserts and added sugars should be used sparingly.  Include small amounts of heart-healthy fats. These types of fats include nuts, oils, and tub margarine. Limit saturated and trans fats. These fats have been shown to be harmful in the body. CHOOSING FOODS  The following food groups are based on a 2000 calorie diet. See your Registered Dietitian for individual calorie needs. Grains and Grain Products (6 to 8 servings daily)  Eat More Often: Whole-wheat bread, brown rice, whole-grain or wheat pasta, quinoa, popcorn without added fat or salt (air popped).  Eat Less Often: White bread, white pasta, white rice, cornbread. Vegetables (4 to 5 servings daily)  Eat More Often: Fresh, frozen, and canned  vegetables. Vegetables may be raw, steamed, roasted, or grilled with a minimal amount of fat.  Eat Less Often/Avoid: Creamed or fried vegetables. Vegetables in a cheese sauce. Fruit (4 to 5 servings daily)  Eat More Often: All fresh, canned (in natural juice), or frozen fruits. Dried fruits without added sugar. One hundred percent fruit juice ( cup [237 mL] daily).  Eat Less Often: Dried fruits with added sugar. Canned fruit in light or heavy syrup. [employer], Fish, and Poultry (2 servings or less daily. One serving is 3 to 4 oz [85-114 g]).  Eat More Often: Ninety percent or leaner ground beef, tenderloin, sirloin. Round cuts of beef, chicken breast, [place] breast. All fish. Grill, bake, or broil your meat. Nothing should be fried.  Eat Less Often/Avoid: Fatty cuts of meat, [place], or chicken leg, thigh, or wing. Fried cuts of meat or fish. Dairy (2 to 3 servings)  Eat More Often: Low-fat or fat-free milk, low-fat plain or light yogurt, reduced-fat or part-skim cheese.  Eat Less Often/Avoid: Milk (whole, 2%).Whole milk yogurt. Full-fat cheeses. Nuts, Seeds, and Legumes (4 to 5 servings per week)  Eat More Often: All without added salt.  Eat Less Often/Avoid: Salted nuts and seeds, canned beans with added salt. Fats and Sweets (limited)  Eat More Often: Vegetable oils, tub margarines without trans fats, sugar-free gelatin. Mayonnaise and salad dressings.  Eat Less Often/Avoid: Coconut oils, palm oils, butter, stick margarine, cream, half and half, cookies, candy, pie. FOR MORE INFORMATION The Dash Diet Eating Plan: www.dashdiet.org Document Released: 10/14/2011 Document Revised: 01/17/2012 Document Reviewed: 10/14/2011 [facility] Patient Information 2014 [place], [place].  Stroke Prevention Some medical conditions and behaviors are associated with  an increased chance of having a stroke. You may prevent a stroke by making healthy choices and managing medical conditions. HOW CAN I  REDUCE MY RISK OF HAVING A STROKE?   Stay physically active. Get at least 30 minutes of activity on most or all days.  Do not smoke. It may also be helpful to avoid exposure to secondhand smoke.  Limit alcohol use. Moderate alcohol use is considered to be:  No more than 2 drinks per day for men.  No more than 1 drink per day for nonpregnant women.  Eat healthy foods. This involves  Eating 5 or more servings of fruits and vegetables a day.  Following a diet that addresses high blood pressure (hypertension), high cholesterol, diabetes, or obesity.  Manage your cholesterol levels.  A diet low in saturated fat, trans fat, and cholesterol and high in fiber may control cholesterol levels.  Take any prescribed medicines to control cholesterol as directed by your health care provider.  Manage your diabetes.  A controlled-carbohydrate, controlled-sugar diet is recommended to manage diabetes.  Take any prescribed medicines to control diabetes as directed by your health care provider.  Control your hypertension.  A low-salt (sodium), low-saturated fat, low-trans fat, and low-cholesterol diet is recommended to manage hypertension.  Take any prescribed medicines to control hypertension as directed by your health care provider.  Maintain a healthy weight.  A reduced-calorie, low-sodium, low-saturated fat, low-trans fat, low-cholesterol diet is recommended to manage weight.  Stop drug abuse.  Avoid taking birth control pills.  Talk to your health care provider about the risks of taking birth control pills if you are over 48 years old, smoke, get migraines, or have ever had a blood clot.  Get evaluated for sleep disorders (sleep apnea).  Talk to your health care provider about getting a sleep evaluation if you snore a lot or have excessive sleepiness.  Take medicines as directed by your health care provider.  For some people, aspirin or blood thinners (anticoagulants) are helpful  in reducing the risk of forming abnormal blood clots that can lead to stroke. If you have the irregular heart rhythm of atrial fibrillation, you should be on a blood thinner unless there is a good reason you cannot take them.  Understand all your medicine instructions.  Make sure that other other conditions (such as anemia or atherosclerosis) are addressed. SEEK IMMEDIATE MEDICAL CARE IF:   You have sudden weakness or numbness of the face, arm, or leg, especially on one side of the body.  Your face or eyelid droops to one side.  You have sudden confusion.  You have trouble speaking (aphasia) or understanding.  You have sudden trouble seeing in one or both eyes.  You have sudden trouble walking.  You have dizziness.  You have a loss of balance or coordination.  You have a sudden, severe headache with no known cause.  You have new chest pain or an irregular heartbeat. Any of these symptoms may represent a serious problem that is an emergency. Do not wait to see if the symptoms will go away. Get medical help at once. Call your local emergency services  (911 in [place] Do not drive yourself to the hospital. Document Released: 12/02/2004 Document Revised: 08/15/2013 Document Reviewed: 04/27/2013 [facility] Patient Information 2014 [place].

## 2014-02-22 NOTE — Telephone Encounter (Signed)
Pt has come in today requesting a refill on script zolpidem (AMBIEN) 5 MG tablet; pt next appt is in 6/2 @11 :45; please f/u with pt about medication

## 2014-03-04 ENCOUNTER — Other Ambulatory Visit: Payer: Self-pay | Admitting: Neurological Surgery

## 2014-03-04 ENCOUNTER — Telehealth: Payer: Self-pay | Admitting: Emergency Medicine

## 2014-03-04 ENCOUNTER — Encounter (HOSPITAL_COMMUNITY): Payer: Self-pay | Admitting: Pharmacy Technician

## 2014-03-04 ENCOUNTER — Encounter: Payer: Self-pay | Admitting: Cardiology

## 2014-03-04 ENCOUNTER — Ambulatory Visit (INDEPENDENT_AMBULATORY_CARE_PROVIDER_SITE_OTHER): Payer: BC Managed Care – PPO | Admitting: Cardiology

## 2014-03-04 VITALS — BP 113/84 | HR 74 | Ht 64.0 in | Wt 208.0 lb

## 2014-03-04 DIAGNOSIS — G459 Transient cerebral ischemic attack, unspecified: Secondary | ICD-10-CM

## 2014-03-04 DIAGNOSIS — Z01810 Encounter for preprocedural cardiovascular examination: Secondary | ICD-10-CM

## 2014-03-04 MED ORDER — VARENICLINE TARTRATE 1 MG PO TABS: 1.0000 mg | ORAL_TABLET | Freq: Two times a day (BID) | ORAL | Status: DC

## 2014-03-04 MED ORDER — VARENICLINE TARTRATE 0.5 MG X 11 & 1 MG X 42 PO MISC: ORAL | Status: DC

## 2014-03-04 NOTE — Patient Instructions (Addendum)
Your physician recommends that you follow up as needed.   Your physician recommends you start taking Chantix to help stop smoking.   Chantix should be taken as recommended:    Take 0.5 mg once daily for days 1-3 Take 0.5 mg two times daily for days 4-7 Take 1 mg two times daily for the next 11 weeks  The prescription for Chantix will be phoned into your pharmacy listed

## 2014-03-04 NOTE — Telephone Encounter (Signed)
Pt requesting medication refill /ambien for sleep. Please f/u

## 2014-03-04 NOTE — Addendum Note (Signed)
Addended by: Nuala Alpha on: 03/04/2014 09:48 AM   Modules accepted: Orders

## 2014-03-04 NOTE — Progress Notes (Signed)
Patient ID: [name], female   DOB: [birth year], 43 y.o.   MRN: [id number]    Patient Name: [name] Date of Encounter: 03/04/2014  Primary Care Provider:  Angelica Chessman, MD Primary Cardiologist:  Dorothy Spark  Problem List   Past Medical History  Diagnosis Date  . Stroke 05/2013  . History of TIAs    Past Surgical History  Procedure Laterality Date  . Cesarean section      x 1 with 5th pregnancy  . Cholecystectomy    . Tee without cardioversion N/A 05/21/2013    Procedure: TRANSESOPHAGEAL ECHOCARDIOGRAM (TEE);  Surgeon: Sueanne Margarita, MD;  Location: [facility];  Service: Cardiovascular;  Laterality: N/A;    Allergies  Allergies  Allergen Reactions  . Shellfish Allergy Itching and Swelling    HPI  76 year African American lady with h/o TIAs, hyperlipidemia on atorvastatin, and impaired glucose tolerance. The patient is coming for a preoperative evaluation prior to the cervical spine surgery for C6 radiculopathy associated with arm tingling and numbness.  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 She denies any chest pain, palpitations, syncopy, orthopnea, PND or LE edema. She feels SOB with significant exertion.  She has quit smoking in the past but restarted. Chantix helped in the past.  At the time of TIAs in October 2014 she presented with transient right arm numbness lasting only a few minutes which occurred several times as well as some confusion. CT head was negative but MRI scan showed a small 4 mm acute infarct in the high left parietal lobe. She underwent evaluation which was fairly unrevealing. LDL cholesterol was elevated at 118. Hemoglobin A1c was 5.8. Carotid Dopplers showed no significant extraction stenosis.  Echocardiogram showed normal ejection fraction. EKG and telemetry monitoring showed regular sinus rhythm. Hypercoagulable panel and vasculitic labs were normal.TEE was normal without cardiac source of embolism. No PFO or ASD.  She is on  Plavix.   Home Medications  Prior to Admission medications   Medication Sig Start Date End Date Taking? Authorizing Provider  acetaminophen (TYLENOL) 325 MG tablet Take 650 mg by mouth every 6 (six) hours as needed for mild pain.    Historical Provider, MD  atorvastatin (LIPITOR) 40 MG tablet Take 1 tablet (40 mg total) by mouth daily at 6 PM. 01/07/14   Reyne Dumas, MD  clopidogrel (PLAVIX) 75 MG tablet Take 1 tablet (75 mg total) by mouth daily with breakfast. 01/07/14   Reyne Dumas, MD  ergocalciferol (VITAMIN D2) 50000 UNITS capsule Take 1 capsule (50,000 Units total) by mouth once a week. 01/09/14   Reyne Dumas, MD  ferrous sulfate 325 (65 FE) MG tablet Take 1 tablet (325 mg total) by mouth 2 (two) times daily with a meal. 01/09/14   Reyne Dumas, MD  gabapentin (NEURONTIN) 300 MG capsule Patient has written taper up to 9 pills a day 01/21/14   Marin Olp, MD  HYDROcodone-acetaminophen (NORCO/VICODIN) 5-325 MG per tablet  01/11/14   Historical Provider, MD  naproxen (NAPROSYN) 500 MG tablet  01/24/14   Historical Provider, MD  oxyCODONE-acetaminophen (PERCOCET) 10-325 MG per tablet Take 1 tablet by mouth every 4 (four) hours as needed for pain. 01/21/14   Marin Olp, MD  zolpidem (AMBIEN) 5 MG tablet Take 1 tablet (5 mg total) by mouth at bedtime as needed for sleep. 01/07/14   Reyne Dumas, MD    Family History  Family History  Problem Relation Age of Onset  . Renal  Disease Father     dialysis  . Hypertension Father   . Heart disease Mother     stabbed in heart    Social History  History   Social History  . Marital Status: Divorced    Spouse Name: N/A    Number of Children: 7  . Years of Education: GEd   Occupational History  . CNA    Social History Main Topics  . Smoking status: Current Every Day Smoker -- 0.50 packs/day for 28 years  . Smokeless tobacco: Never Used     Comment: tried nothing for cessation  . Alcohol Use: Yes     Comment: occasional  . Drug Use:  No  . Sexual Activity: Not on file   Other Topics Concern  . Not on file   Social History Narrative   Patient lives at home with her family    Patient drinks soda     Review of Systems, as per HPI, otherwise negative General:  No chills, fever, night sweats or weight changes.  Cardiovascular:  No chest pain, dyspnea on exertion, edema, orthopnea, palpitations, paroxysmal nocturnal dyspnea. Dermatological: No rash, lesions/masses Respiratory: No cough, dyspnea Urologic: No hematuria, dysuria Abdominal:   No nausea, vomiting, diarrhea, bright red blood per rectum, melena, or hematemesis Neurologic:  No visual changes, wkns, changes in mental status. All other systems reviewed and are otherwise negative except as noted above.  Physical Exam  BP 113/84, HR 74 General: Pleasant, NAD Psych: Normal affect. Neuro: Alert and oriented X 3. Moves all extremities spontaneously. HEENT: Normal  Neck: Supple without bruits or JVD. Lungs:  Resp regular and unlabored, CTA. Heart: RRR no s3, s4, or murmurs. Abdomen: Soft, non-tender, non-distended, BS + x 4.  Extremities: No clubbing, cyanosis or edema. DP/PT/Radials 2+ and equal bilaterally.  Labs:  No results found for this basename: CKTOTAL, CKMB, TROPONINI,  in the last 72 hours Lab Results  Component Value Date   WBC 5.8 01/08/2014   HGB 9.1* 01/08/2014   HCT 29.8* 01/08/2014   MCV 66.1* 01/08/2014   PLT 297 01/08/2014        Component Value Date/Time   NA 139 01/08/2014 1213   K 4.1 01/08/2014 1213   CL 103 01/08/2014 1213   CO2 25 01/08/2014 1213   GLUCOSE 101* 01/08/2014 1213   BUN 6 01/08/2014 1213   CREATININE 0.78 01/08/2014 1213   CREATININE 0.88 01/07/2014 1033   CALCIUM 8.8 01/08/2014 1213   PROT 7.5 01/08/2014 1213   ALBUMIN 3.7 01/08/2014 1213   AST 17 01/08/2014 1213   ALT 9 01/08/2014 1213   ALKPHOS 73 01/08/2014 1213   BILITOT <0.2* 01/08/2014 1213   GFRNONAA >90 01/08/2014 1213   GFRNONAA 80 01/07/2014 1033   GFRAA >90 01/08/2014 1213   GFRAA  >89 01/07/2014 1033   Lab Results  Component Value Date   CHOL 210* 01/07/2014   HDL 60 01/07/2014   LDLCALC 118* 01/07/2014   TRIG 162* 01/07/2014    Accessory Clinical Findings  Echocardiogram - 05/21/2013 - Left ventricle: Systolic function was normal. The estimated ejection fraction was in the range of 55% to 60%. Wall motion was normal; there were no regional wall motion abnormalities. - Mitral valve: Mild regurgitation. - Left atrium: No evidence of thrombus in the atrial cavity or appendage. - Right atrium: No evidence of thrombus in the atrial cavity or appendage. - Atrial septum: No defect was identified. Echo contrast study showed no right-to-left atrial level shunt, at baseline  or with provocation. Impressions:  - No cardiac source of emboli was indentified.  ECG - SR, non-specific ST-T wave changes    Assessment & Plan  A 43 year old female with prior h/o TIAs, hyperlipidemia who is coming for preoperative evaluation prior to the cervical spinal fusion for C6 radiculopathy.  The patient has no h/o heart disease and is currently free of symptoms suggesting angina or heart failure. She can definitely achieve 4 METS. There is currently no contraindication for this patient to undergo her surgery.  Regarding elevated TAG she was advised to take OTC fish oil and improve her diet as she eats a lot of fried food.  She was also encouraged to stop smoking, and was prescribed Chantix.   Follow up as needed.   Dorothy Spark, MD, [facility] 03/04/2014, 7:44 AM

## 2014-03-05 ENCOUNTER — Other Ambulatory Visit: Payer: Self-pay

## 2014-03-05 ENCOUNTER — Encounter (HOSPITAL_COMMUNITY)
Admission: RE | Admit: 2014-03-05 | Discharge: 2014-03-05 | Disposition: A | Discharge status: Home / Self Care | Payer: BC Managed Care – PPO | Source: Ambulatory Visit | Attending: Neurological Surgery | Admitting: Neurological Surgery

## 2014-03-05 ENCOUNTER — Other Ambulatory Visit: Payer: Self-pay | Admitting: *Deleted

## 2014-03-05 ENCOUNTER — Telehealth: Payer: Self-pay | Admitting: Cardiology

## 2014-03-05 ENCOUNTER — Encounter (HOSPITAL_COMMUNITY): Payer: Self-pay

## 2014-03-05 ENCOUNTER — Ambulatory Visit (HOSPITAL_COMMUNITY)
Admission: RE | Admit: 2014-03-05 | Discharge: 2014-03-05 | Disposition: A | Discharge status: Home / Self Care | Payer: BC Managed Care – PPO | Source: Ambulatory Visit | Attending: Anesthesiology | Admitting: Anesthesiology

## 2014-03-05 DIAGNOSIS — Z01812 Encounter for preprocedural laboratory examination: Secondary | ICD-10-CM | POA: Insufficient documentation

## 2014-03-05 DIAGNOSIS — Z01818 Encounter for other preprocedural examination: Secondary | ICD-10-CM

## 2014-03-05 HISTORY — DX: Anemia, unspecified: D64.9

## 2014-03-05 HISTORY — DX: Pneumonia, unspecified organism: J18.9

## 2014-03-05 HISTORY — DX: Anesthesia of skin: R20.0

## 2014-03-05 HISTORY — DX: Personal history of other medical treatment: Z92.89

## 2014-03-05 HISTORY — DX: Headache: R51

## 2014-03-05 LAB — HCG, SERUM, QUALITATIVE: Preg, Serum: NEGATIVE

## 2014-03-05 LAB — CBC
HCT: 32.4 % — ABNORMAL LOW (ref 36.0–46.0)
Hemoglobin: 9.9 g/dL — ABNORMAL LOW (ref 12.0–15.0)
MCH: 20.8 pg — ABNORMAL LOW (ref 26.0–34.0)
MCHC: 30.6 g/dL (ref 30.0–36.0)
MCV: 68.2 fL — ABNORMAL LOW (ref 78.0–100.0)
PLATELETS: 264 10*3/uL (ref 150–400)
RBC: 4.75 MIL/uL (ref 3.87–5.11)
RDW: 20.5 % — AB (ref 11.5–15.5)
WBC: 7.4 10*3/uL (ref 4.0–10.5)

## 2014-03-05 LAB — BASIC METABOLIC PANEL
BUN: 11 mg/dL (ref 6–23)
CALCIUM: 8.9 mg/dL (ref 8.4–10.5)
CO2: 25 mEq/L (ref 19–32)
Chloride: 100 mEq/L (ref 96–112)
Creatinine, Ser: 0.83 mg/dL (ref 0.50–1.10)
GFR, EST NON AFRICAN AMERICAN: 85 mL/min — AB (ref >90)
Glucose, Bld: 90 mg/dL (ref 70–99)
POTASSIUM: 3.7 meq/L (ref 3.7–5.3)
SODIUM: 138 meq/L (ref 137–147)

## 2014-03-05 LAB — SURGICAL PCR SCREEN
MRSA, PCR: NEGATIVE
STAPHYLOCOCCUS AUREUS: NEGATIVE

## 2014-03-05 MED ORDER — VARENICLINE TARTRATE 0.5 MG X 11 & 1 MG X 42 PO MISC: ORAL | Status: DC

## 2014-03-05 MED ORDER — VARENICLINE TARTRATE 1 MG PO TABS: 1.0000 mg | ORAL_TABLET | Freq: Two times a day (BID) | ORAL | Status: DC

## 2014-03-05 NOTE — Telephone Encounter (Signed)
New message     Want presc for chantex (stop smoking)---[facility] [address].  Mindy wanted me to send it to the nurse

## 2014-03-05 NOTE — Pre-Procedure Instructions (Signed)
[name]  03/05/2014   Your procedure is scheduled on:  Wednesday, March 06, 2014 at 1:08 PM  Report to [facility] [facility] (use Main Entrance "A'') at 11:00 AM.  Call this number if you have problems the morning of surgery: [phone]   Remember:   Do not eat food or drink liquids after midnight.   Take these medicines the morning of surgery with A SIP OF WATER: if needed:oxyCODONE-acetaminophen (PERCOCET) for pain   Do not wear jewelry, make-up or nail polish.  Do not wear lotions, powders, or perfumes. You may wear deodorant.  Do not shave 48 hours prior to surgery.  Do not bring valuables to the hospital.  [facility] is not responsible for any belongings or valuables.               Contacts, dentures or bridgework may not be worn into surgery.  Leave suitcase in the car. After surgery it may be brought to your room.  For patients admitted to the hospital, discharge time is determined by your treatment team.               Patients discharged the day of surgery will not be allowed to drive home.  Name and phone number of your driver:   Special Instructions:  Special Instructions:Special Instructions: [facility] - Preparing for Surgery  Before surgery, you can play an important role.  Because skin is not sterile, your skin needs to be as free of germs as possible.  You can reduce the number of germs on you skin by washing with CHG (chlorahexidine gluconate) soap before surgery.  CHG is an antiseptic cleaner which kills germs and bonds with the skin to continue killing germs even after washing.  Please DO NOT use if you have an allergy to CHG or antibacterial soaps.  If your skin becomes reddened/irritated stop using the CHG and inform your nurse when you arrive at Short Stay.  Do not shave (including legs and underarms) for at least 48 hours prior to the first CHG shower.  You may shave your face.  Please follow these instructions carefully:   1.  Shower with CHG Soap  the night before surgery and the morning of Surgery.  2.  If you choose to wash your hair, wash your hair first as usual with your normal shampoo.  3.  After you shampoo, rinse your hair and body thoroughly to remove the Shampoo.  4.  Use CHG as you would any other liquid soap.  You can apply chg directly  to the skin and wash gently with scrungie or a clean washcloth.  5.  Apply the CHG Soap to your body ONLY FROM THE NECK DOWN.  Do not use on open wounds or open sores.  Avoid contact with your eyes, ears, mouth and genitals (private parts).  Wash genitals (private parts) with your normal soap.  6.  Wash thoroughly, paying special attention to the area where your surgery will be performed.  7.  Thoroughly rinse your body with warm water from the neck down.  8.  DO NOT shower/wash with your normal soap after using and rinsing off the CHG Soap.  9.  Pat yourself dry with a clean towel.            10.  Wear clean pajamas.            11.  Place clean sheets on your bed the night of your first shower and do not  sleep with pets.  Day of Surgery  Do not apply any lotions the morning of surgery.  Please wear clean clothes to the hospital/surgery center.   Please read over the following fact sheets that you were given: Pain Booklet, Coughing and Deep Breathing, MRSA Information and Surgical Site Infection Prevention

## 2014-03-05 NOTE — Progress Notes (Signed)
Pt denies SOB and chest pain. Pt C/O cough for one week; pt denies having a chest x ray within the last year. Pt was seen by Dr. Meda Coffee (cardiologist)  due to cardiac history; cardiac clearance on chart. Pt denies having a stress test and cardiac cath. Dr. Ronnald Ramp made aware that pt hemoglobin was 9.9; verbal order given and read back to do a STAT Type and Screen on DOS. Pt made aware to stop taking Aspirin, Coumadin, Plavix, Effient, and herbal medications. Do not take any NSAIDs ie: Ibuprofen, Advil, Naproxen or any medication containing Aspirin. Pt stated that she has not taken any Plavix "in about 3 weeks."

## 2014-03-05 NOTE — Telephone Encounter (Signed)
Med for chantix sent to pts pharmacy of choice

## 2014-03-05 NOTE — Telephone Encounter (Signed)
Reordered Chantix for pt.  Order in [place] Confirmed pharmacy with pt-wants it sent to [facility].  Pt verbalized understanding

## 2014-03-06 ENCOUNTER — Ambulatory Visit (HOSPITAL_COMMUNITY): Payer: BC Managed Care – PPO | Admitting: Certified Registered Nurse Anesthetist

## 2014-03-06 ENCOUNTER — Encounter (HOSPITAL_COMMUNITY): Payer: Self-pay | Admitting: *Deleted

## 2014-03-06 ENCOUNTER — Encounter (HOSPITAL_COMMUNITY): Payer: BC Managed Care – PPO | Admitting: Certified Registered Nurse Anesthetist

## 2014-03-06 ENCOUNTER — Encounter (HOSPITAL_COMMUNITY)
Admission: RE | Disposition: A | Discharge status: Home / Self Care | Payer: Self-pay | Source: Ambulatory Visit | Attending: Neurological Surgery

## 2014-03-06 ENCOUNTER — Ambulatory Visit: Payer: BC Managed Care – PPO | Admitting: Cardiology

## 2014-03-06 ENCOUNTER — Ambulatory Visit (HOSPITAL_COMMUNITY)
Admission: RE | Admit: 2014-03-06 | Discharge: 2014-03-07 | Disposition: A | Discharge status: Home / Self Care | Payer: BC Managed Care – PPO | Source: Ambulatory Visit | Attending: Neurological Surgery | Admitting: Neurological Surgery

## 2014-03-06 ENCOUNTER — Ambulatory Visit (HOSPITAL_COMMUNITY): Payer: BC Managed Care – PPO

## 2014-03-06 DIAGNOSIS — Z01812 Encounter for preprocedural laboratory examination: Secondary | ICD-10-CM | POA: Insufficient documentation

## 2014-03-06 DIAGNOSIS — M4000 Postural kyphosis, site unspecified: Secondary | ICD-10-CM | POA: Insufficient documentation

## 2014-03-06 DIAGNOSIS — Z981 Arthrodesis status: Secondary | ICD-10-CM

## 2014-03-06 DIAGNOSIS — F17200 Nicotine dependence, unspecified, uncomplicated: Secondary | ICD-10-CM | POA: Insufficient documentation

## 2014-03-06 DIAGNOSIS — M47812 Spondylosis without myelopathy or radiculopathy, cervical region: Secondary | ICD-10-CM | POA: Insufficient documentation

## 2014-03-06 DIAGNOSIS — Z8673 Personal history of transient ischemic attack (TIA), and cerebral infarction without residual deficits: Secondary | ICD-10-CM | POA: Insufficient documentation

## 2014-03-06 DIAGNOSIS — Z01818 Encounter for other preprocedural examination: Secondary | ICD-10-CM | POA: Insufficient documentation

## 2014-03-06 DIAGNOSIS — Z7902 Long term (current) use of antithrombotics/antiplatelets: Secondary | ICD-10-CM | POA: Insufficient documentation

## 2014-03-06 HISTORY — PX: ANTERIOR CERVICAL DECOMP/DISCECTOMY FUSION: SHX1161

## 2014-03-06 LAB — GLUCOSE, CAPILLARY: Glucose-Capillary: 93 mg/dL (ref 70–99)

## 2014-03-06 LAB — TYPE AND SCREEN
ABO/RH(D): B POS
Antibody Screen: NEGATIVE

## 2014-03-06 LAB — ABO/RH: ABO/RH(D): B POS

## 2014-03-06 SURGERY — ANTERIOR CERVICAL DECOMPRESSION/DISCECTOMY FUSION 2 LEVELS
Anesthesia: General

## 2014-03-06 MED ORDER — POTASSIUM CHLORIDE IN NACL 20-0.9 MEQ/L-% IV SOLN
INTRAVENOUS | Status: DC
  Filled 2014-03-06 (×3): qty 1000

## 2014-03-06 MED ORDER — OXYCODONE HCL 5 MG/5ML PO SOLN: 5.0000 mg | Freq: Once | ORAL | Status: AC | PRN

## 2014-03-06 MED ORDER — METHOCARBAMOL 500 MG PO TABS
ORAL_TABLET | ORAL | Status: AC
  Filled 2014-03-06: qty 1

## 2014-03-06 MED ORDER — PROPOFOL 10 MG/ML IV BOLUS
INTRAVENOUS | Status: DC | PRN
  Administered 2014-03-06: 200 mg via INTRAVENOUS

## 2014-03-06 MED ORDER — OXYCODONE-ACETAMINOPHEN 5-325 MG PO TABS
1.0000 | ORAL_TABLET | ORAL | Status: DC | PRN
  Administered 2014-03-06 – 2014-03-07 (×4): 2 via ORAL
  Filled 2014-03-06 (×4): qty 2

## 2014-03-06 MED ORDER — ONDANSETRON HCL 4 MG/2ML IJ SOLN
INTRAMUSCULAR | Status: AC
  Filled 2014-03-06: qty 2

## 2014-03-06 MED ORDER — LACTATED RINGERS IV SOLN
INTRAVENOUS | Status: DC
  Administered 2014-03-06: 12:00:00 via INTRAVENOUS

## 2014-03-06 MED ORDER — ARTIFICIAL TEARS OP OINT
TOPICAL_OINTMENT | OPHTHALMIC | Status: AC
  Filled 2014-03-06: qty 3.5

## 2014-03-06 MED ORDER — NEOSTIGMINE METHYLSULFATE 1 MG/ML IJ SOLN
INTRAMUSCULAR | Status: DC | PRN
  Administered 2014-03-06: 3 mg via INTRAVENOUS

## 2014-03-06 MED ORDER — MIDAZOLAM HCL 5 MG/5ML IJ SOLN
INTRAMUSCULAR | Status: DC | PRN
  Administered 2014-03-06: 1 mg via INTRAVENOUS

## 2014-03-06 MED ORDER — LACTATED RINGERS IV SOLN
INTRAVENOUS | Status: DC | PRN
  Administered 2014-03-06 (×2): via INTRAVENOUS

## 2014-03-06 MED ORDER — GLYCOPYRROLATE 0.2 MG/ML IJ SOLN
INTRAMUSCULAR | Status: AC
  Filled 2014-03-06: qty 2

## 2014-03-06 MED ORDER — HYDROMORPHONE HCL PF 1 MG/ML IJ SOLN
INTRAMUSCULAR | Status: AC
  Filled 2014-03-06: qty 1

## 2014-03-06 MED ORDER — ARTIFICIAL TEARS OP OINT
TOPICAL_OINTMENT | OPHTHALMIC | Status: DC | PRN
  Administered 2014-03-06: 1 via OPHTHALMIC

## 2014-03-06 MED ORDER — CEFAZOLIN SODIUM-DEXTROSE 2-3 GM-% IV SOLR
2.0000 g | INTRAVENOUS | Status: AC
  Administered 2014-03-06: 2 g via INTRAVENOUS

## 2014-03-06 MED ORDER — DEXAMETHASONE 4 MG PO TABS
4.0000 mg | ORAL_TABLET | Freq: Four times a day (QID) | ORAL | Status: DC
  Administered 2014-03-06 – 2014-03-07 (×3): 4 mg via ORAL
  Filled 2014-03-06 (×6): qty 1

## 2014-03-06 MED ORDER — ROCURONIUM BROMIDE 50 MG/5ML IV SOLN
INTRAVENOUS | Status: AC
  Filled 2014-03-06: qty 1

## 2014-03-06 MED ORDER — SODIUM CHLORIDE 0.9 % IJ SOLN: 3.0000 mL | INTRAMUSCULAR | Status: DC | PRN

## 2014-03-06 MED ORDER — ONDANSETRON HCL 4 MG/2ML IJ SOLN
4.0000 mg | INTRAMUSCULAR | Status: DC | PRN
  Administered 2014-03-06: 4 mg via INTRAVENOUS
  Filled 2014-03-06: qty 2

## 2014-03-06 MED ORDER — PHENOL 1.4 % MT LIQD
1.0000 | OROMUCOSAL | Status: DC | PRN
  Administered 2014-03-06: 1 via OROMUCOSAL
  Filled 2014-03-06: qty 177

## 2014-03-06 MED ORDER — NEOSTIGMINE METHYLSULFATE 1 MG/ML IJ SOLN
INTRAMUSCULAR | Status: AC
  Filled 2014-03-06: qty 10

## 2014-03-06 MED ORDER — MORPHINE SULFATE 2 MG/ML IJ SOLN
1.0000 mg | INTRAMUSCULAR | Status: DC | PRN
  Administered 2014-03-06: 2 mg via INTRAVENOUS
  Filled 2014-03-06: qty 1

## 2014-03-06 MED ORDER — MENTHOL 3 MG MT LOZG
1.0000 | LOZENGE | OROMUCOSAL | Status: DC | PRN
  Administered 2014-03-06: 3 mg via ORAL
  Filled 2014-03-06 (×2): qty 9

## 2014-03-06 MED ORDER — GLYCOPYRROLATE 0.2 MG/ML IJ SOLN
INTRAMUSCULAR | Status: DC | PRN
  Administered 2014-03-06: 0.4 mg via INTRAVENOUS

## 2014-03-06 MED ORDER — ACETAMINOPHEN 650 MG RE SUPP: 650.0000 mg | RECTAL | Status: DC | PRN

## 2014-03-06 MED ORDER — METHOCARBAMOL 500 MG PO TABS
500.0000 mg | ORAL_TABLET | Freq: Four times a day (QID) | ORAL | Status: DC | PRN
  Administered 2014-03-06 – 2014-03-07 (×3): 500 mg via ORAL
  Filled 2014-03-06 (×2): qty 1

## 2014-03-06 MED ORDER — LIDOCAINE HCL 4 % MT SOLN
OROMUCOSAL | Status: DC | PRN
Start: 2014-03-06 — End: 2014-03-06
  Administered 2014-03-06: 4 mL via TOPICAL

## 2014-03-06 MED ORDER — SENNA 8.6 MG PO TABS
1.0000 | ORAL_TABLET | Freq: Two times a day (BID) | ORAL | Status: DC
  Administered 2014-03-06: 8.6 mg via ORAL
  Filled 2014-03-06 (×3): qty 1

## 2014-03-06 MED ORDER — OXYCODONE HCL 5 MG PO TABS
ORAL_TABLET | ORAL | Status: AC
  Filled 2014-03-06: qty 1

## 2014-03-06 MED ORDER — 0.9 % SODIUM CHLORIDE (POUR BTL) OPTIME
TOPICAL | Status: DC | PRN
  Administered 2014-03-06: 1000 mL

## 2014-03-06 MED ORDER — OXYCODONE HCL 5 MG PO TABS
5.0000 mg | ORAL_TABLET | Freq: Once | ORAL | Status: AC | PRN
  Administered 2014-03-06: 5 mg via ORAL

## 2014-03-06 MED ORDER — PROPOFOL 10 MG/ML IV BOLUS
INTRAVENOUS | Status: AC
  Filled 2014-03-06: qty 20

## 2014-03-06 MED ORDER — FENTANYL CITRATE 0.05 MG/ML IJ SOLN
INTRAMUSCULAR | Status: AC
  Filled 2014-03-06: qty 5

## 2014-03-06 MED ORDER — SODIUM CHLORIDE 0.9 % IJ SOLN: 3.0000 mL | Freq: Two times a day (BID) | INTRAMUSCULAR | Status: DC

## 2014-03-06 MED ORDER — THROMBIN 5000 UNITS EX SOLR
CUTANEOUS | Status: DC | PRN
  Administered 2014-03-06: 14:00:00 via TOPICAL

## 2014-03-06 MED ORDER — METHOCARBAMOL 100 MG/ML IJ SOLN
500.0000 mg | Freq: Four times a day (QID) | INTRAMUSCULAR | Status: DC | PRN
  Filled 2014-03-06: qty 5

## 2014-03-06 MED ORDER — LIDOCAINE HCL (CARDIAC) 20 MG/ML IV SOLN
INTRAVENOUS | Status: DC | PRN
  Administered 2014-03-06: 80 mg via INTRAVENOUS

## 2014-03-06 MED ORDER — ROCURONIUM BROMIDE 100 MG/10ML IV SOLN
INTRAVENOUS | Status: DC | PRN
  Administered 2014-03-06: 50 mg via INTRAVENOUS

## 2014-03-06 MED ORDER — LIDOCAINE HCL (CARDIAC) 20 MG/ML IV SOLN
INTRAVENOUS | Status: AC
  Filled 2014-03-06: qty 5

## 2014-03-06 MED ORDER — PROMETHAZINE HCL 25 MG/ML IJ SOLN: 6.2500 mg | INTRAMUSCULAR | Status: DC | PRN

## 2014-03-06 MED ORDER — MIDAZOLAM HCL 2 MG/2ML IJ SOLN
INTRAMUSCULAR | Status: AC
  Filled 2014-03-06: qty 2

## 2014-03-06 MED ORDER — HEMOSTATIC AGENTS (NO CHARGE) OPTIME
TOPICAL | Status: DC | PRN
  Administered 2014-03-06: 1 via TOPICAL

## 2014-03-06 MED ORDER — THROMBIN 5000 UNITS EX SOLR
CUTANEOUS | Status: DC | PRN
  Administered 2014-03-06 (×2): 5000 [IU] via TOPICAL

## 2014-03-06 MED ORDER — HYDROMORPHONE HCL PF 1 MG/ML IJ SOLN
0.2500 mg | INTRAMUSCULAR | Status: DC | PRN
  Administered 2014-03-06 (×4): 0.5 mg via INTRAVENOUS

## 2014-03-06 MED ORDER — SODIUM CHLORIDE 0.9 % IR SOLN
Status: DC | PRN
  Administered 2014-03-06: 14:00:00

## 2014-03-06 MED ORDER — ONDANSETRON HCL 4 MG/2ML IJ SOLN
INTRAMUSCULAR | Status: DC | PRN
  Administered 2014-03-06: 4 mg via INTRAVENOUS

## 2014-03-06 MED ORDER — DEXAMETHASONE SODIUM PHOSPHATE 4 MG/ML IJ SOLN
4.0000 mg | Freq: Four times a day (QID) | INTRAMUSCULAR | Status: DC
  Filled 2014-03-06 (×4): qty 1

## 2014-03-06 MED ORDER — CEFAZOLIN SODIUM 1-5 GM-% IV SOLN
1.0000 g | Freq: Three times a day (TID) | INTRAVENOUS | Status: DC
  Administered 2014-03-06: 1 g via INTRAVENOUS
  Filled 2014-03-06: qty 50

## 2014-03-06 MED ORDER — ACETAMINOPHEN 325 MG PO TABS: 650.0000 mg | ORAL_TABLET | ORAL | Status: DC | PRN

## 2014-03-06 MED ORDER — FENTANYL CITRATE 0.05 MG/ML IJ SOLN
INTRAMUSCULAR | Status: DC | PRN
  Administered 2014-03-06 (×2): 50 ug via INTRAVENOUS
  Administered 2014-03-06: 100 ug via INTRAVENOUS
  Administered 2014-03-06: 50 ug via INTRAVENOUS
  Administered 2014-03-06: 100 ug via INTRAVENOUS
  Administered 2014-03-06 (×3): 50 ug via INTRAVENOUS

## 2014-03-06 SURGICAL SUPPLY — 58 items
BAG DECANTER FOR FLEXI CONT (MISCELLANEOUS) ×3 IMPLANT
BENZOIN TINCTURE PRP APPL 2/3 (GAUZE/BANDAGES/DRESSINGS) ×3 IMPLANT
BLADE 10 SAFETY STRL DISP (BLADE) ×6 IMPLANT
BONE MATRIX OSTEOCEL PRO SM (Bone Implant) ×3 IMPLANT
BUR MATCHSTICK NEURO 3.0 LAGG (BURR) ×3 IMPLANT
CAGE SMALL 7X13X15 (Cage) ×6 IMPLANT
CANISTER SUCT 3000ML (MISCELLANEOUS) ×3 IMPLANT
CLOSURE WOUND 1/2 X4 (GAUZE/BANDAGES/DRESSINGS) ×1
CONT SPEC 4OZ CLIKSEAL STRL BL (MISCELLANEOUS) ×3 IMPLANT
DRAPE C-ARM 42X72 X-RAY (DRAPES) ×6 IMPLANT
DRAPE LAPAROTOMY 100X72 PEDS (DRAPES) ×3 IMPLANT
DRAPE MICROSCOPE ZEISS OPMI (DRAPES) ×3 IMPLANT
DRAPE POUCH INSTRU U-SHP 10X18 (DRAPES) ×3 IMPLANT
DRESSING TELFA 8X3 (GAUZE/BANDAGES/DRESSINGS) ×3 IMPLANT
DRILL BIT HELIX (BIT) ×3 IMPLANT
DRSG OPSITE 4X5.5 SM (GAUZE/BANDAGES/DRESSINGS) ×3 IMPLANT
DRSG OPSITE POSTOP 4X8 (GAUZE/BANDAGES/DRESSINGS) ×3 IMPLANT
DURAPREP 6ML APPLICATOR 50/CS (WOUND CARE) ×3 IMPLANT
ELECT COATED BLADE 2.86 ST (ELECTRODE) ×3 IMPLANT
ELECT REM PT RETURN 9FT ADLT (ELECTROSURGICAL) ×3
ELECTRODE REM PT RTRN 9FT ADLT (ELECTROSURGICAL) ×1 IMPLANT
GAUZE SPONGE 4X4 16PLY XRAY LF (GAUZE/BANDAGES/DRESSINGS) IMPLANT
GLOVE BIO SURGEON STRL SZ8 (GLOVE) ×3 IMPLANT
GLOVE BIO SURGEON STRL SZ8.5 (GLOVE) ×3 IMPLANT
GLOVE BIOGEL PI IND STRL 7.5 (GLOVE) ×1 IMPLANT
GLOVE BIOGEL PI INDICATOR 7.5 (GLOVE) ×2
GLOVE ECLIPSE 7.0 STRL STRAW (GLOVE) ×12 IMPLANT
GLOVE SS BIOGEL STRL SZ 6.5 (GLOVE) ×3 IMPLANT
GLOVE SS BIOGEL STRL SZ 8.5 (GLOVE) ×1 IMPLANT
GLOVE SUPERSENSE BIOGEL SZ 6.5 (GLOVE) ×6
GLOVE SUPERSENSE BIOGEL SZ 8.5 (GLOVE) ×2
GOWN BRE IMP SLV AUR LG STRL (GOWN DISPOSABLE) IMPLANT
GOWN BRE IMP SLV AUR XL STRL (GOWN DISPOSABLE) ×3 IMPLANT
GOWN STRL REIN 2XL LVL4 (GOWN DISPOSABLE) IMPLANT
GOWN STRL REUS W/ TWL LRG LVL3 (GOWN DISPOSABLE) ×1 IMPLANT
GOWN STRL REUS W/ TWL XL LVL3 (GOWN DISPOSABLE) ×3 IMPLANT
GOWN STRL REUS W/TWL LRG LVL3 (GOWN DISPOSABLE) ×2
GOWN STRL REUS W/TWL XL LVL3 (GOWN DISPOSABLE) ×6
HEMOSTAT POWDER KIT SURGIFOAM (HEMOSTASIS) ×3 IMPLANT
KIT BASIN OR (CUSTOM PROCEDURE TRAY) ×3 IMPLANT
KIT ROOM TURNOVER OR (KITS) ×3 IMPLANT
NEEDLE HYPO 25X1 1.5 SAFETY (NEEDLE) ×3 IMPLANT
NEEDLE SPNL 20GX3.5 QUINCKE YW (NEEDLE) ×3 IMPLANT
NS IRRIG 1000ML POUR BTL (IV SOLUTION) ×3 IMPLANT
PACK LAMINECTOMY NEURO (CUSTOM PROCEDURE TRAY) ×3 IMPLANT
PAD ARMBOARD 7.5X6 YLW CONV (MISCELLANEOUS) ×9 IMPLANT
PLATE ARCHON 2-LEVEL 38MM (Plate) ×3 IMPLANT
RUBBERBAND STERILE (MISCELLANEOUS) ×6 IMPLANT
SCREW ARCHON SELFTAP 4.0X13 (Screw) ×18 IMPLANT
SPONGE INTESTINAL PEANUT (DISPOSABLE) ×3 IMPLANT
SPONGE SURGIFOAM ABS GEL SZ50 (HEMOSTASIS) ×3 IMPLANT
STRIP CLOSURE SKIN 1/2X4 (GAUZE/BANDAGES/DRESSINGS) ×2 IMPLANT
SUT VIC AB 3-0 SH 8-18 (SUTURE) ×6 IMPLANT
SYR 20ML ECCENTRIC (SYRINGE) ×3 IMPLANT
TOWEL OR 17X24 6PK STRL BLUE (TOWEL DISPOSABLE) ×3 IMPLANT
TOWEL OR 17X26 10 PK STRL BLUE (TOWEL DISPOSABLE) ×3 IMPLANT
TRAP SPECIMEN MUCOUS 40CC (MISCELLANEOUS) IMPLANT
WATER STERILE IRR 1000ML POUR (IV SOLUTION) ×3 IMPLANT

## 2014-03-06 NOTE — Plan of Care (Signed)
Problem: Consults Goal: Diagnosis - Spinal Surgery Outcome: Completed/Met Date Met:  03/06/14 Cervical Spine Fusion

## 2014-03-06 NOTE — Op Note (Signed)
03/06/2014  3:54 PM  PATIENT:  [name]  43 y.o. 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   SURGEON:  Sherley Bounds, MD  ASSISTANTS: dr Arnoldo Morale  ANESTHESIA:   General  EBL: 100 ml  Total I/O In: 1000 [I.V.:1000] Out: 100 [Blood:100]  BLOOD ADMINISTERED:none  DRAINS: none   SPECIMEN:  No Specimen  INDICATION FOR PROCEDURE: Patient presented with a long history of neck and RUE pain. Tried medical mgmt for quite a long time. MRI showed spondylosis C4-5, C5-6. I recommended ACDF with plating at C4-5 and C5-6 . Patient understood the risks, benefits, and alternatives and potential outcomes and wished to proceed.  PROCEDURE DETAILS: Patient was brought to the operating room placed under general endotracheal anesthesia. Patient was placed in the supine position on the operating room table. The neck was prepped with Duraprep and draped in a sterile fashion.   Three cc of local anesthesia was injected and a transverse incision was made on the right side of the neck.  Dissection was carried down thru the subcutaneous tissue and the platysma was  elevated, opened, and undermined with Metzenbaum scissors.  Dissection was then carried out thru an avascular plane leaving the sternocleidomastoid carotid artery and jugular vein laterally and the trachea and esophagus medially. The ventral aspect of the vertebral column was identified and a localizing x-ray was taken. The C4-5 level was identified. The longus colli muscles were then elevated and the retractor was placed to expose C4-5 and C5-6. The annulus was incised and the disc space entered at each level. Discectomy was performed with micro-curettes and pituitary rongeurs. The exact same decompression was performed at each level. I then used the high-speed drill to drill the endplates down to the level of the posterior  longitudinal ligament at each level. The drill shavings were saved in a mucous trap for later arthrodesis. The operating microscope was draped and brought into the field provided additional magnification, illumination and visualization. Discectomy was continued posteriorly thru the disc space. Posterior longitudinal ligament was opened with a nerve hook, and then removed along with disc herniation and osteophytes, decompressing the spinal canal and thecal sac. There were large osteophytes coming off the inferior endplate at each level were undercut and removed. We then continued to remove osteophytic overgrowth and disc material decompressing the neural foramina and exiting nerve roots bilaterally. The scope was angled up and down to help decompress and undercut the vertebral bodies. Once the decompression was completed we could pass a nerve hook circumferentially to assure adequate decompression in the midline and in the neural foramina. So by both visualization and palpation we felt we had an adequate decompression of the neural elements. We then measured the height of the intravertebral disc space and selected a 7 mm millimeter Peek interbody cage packed with autograft and morcellized allograft. It was then gently positioned in the intravertebral disc space and countersunk. I then used a 38 mm archon plate and placed variable angle screws into the vertebral bodies and locked them into position. The wound was irrigated with bacitracin solution, checked for hemostasis which was established and confirmed. Once meticulous hemostasis was achieved, we then proceeded with closure. The platysma was closed with interrupted 3-0 undyed Vicryl suture, the subcuticular layer was closed with interrupted 3-0 undyed Vicryl suture. The skin edges were approximated with steristrips. The drapes were removed. A sterile dressing was  applied. The patient was then awakened from general anesthesia and transferred to the recovery room in  stable condition. At the end of the procedure all sponge, needle and instrument counts were correct.   PLAN OF CARE: Admit for overnight observation  PATIENT DISPOSITION:  PACU - hemodynamically stable.   Delay start of Pharmacological VTE agent (>24hrs) due to surgical blood loss or risk of bleeding:  yes

## 2014-03-06 NOTE — Anesthesia Preprocedure Evaluation (Signed)
Anesthesia Evaluation  Patient identified by MRN, date of birth, ID band Patient awake    Reviewed: Allergy & Precautions, H&P , NPO status , Patient's Chart, lab work & pertinent test results  History of Anesthesia Complications Negative for: history of anesthetic complications  Airway Mallampati: I  Neck ROM: Full    Dental   Pulmonary Current Smoker,  breath sounds clear to auscultation        Cardiovascular negative cardio ROS  Rhythm:Regular Rate:Normal     Neuro/Psych TIACVA    GI/Hepatic negative GI ROS, Neg liver ROS,   Endo/Other  negative endocrine ROS  Renal/GU negative Renal ROS     Musculoskeletal   Abdominal   Peds  Hematology  (+) anemia ,   Anesthesia Other Findings   Reproductive/Obstetrics                           Anesthesia Physical Anesthesia Plan  ASA: III  Anesthesia Plan: General   Post-op Pain Management:    Induction: Intravenous  Airway Management Planned: Oral ETT  Additional Equipment:   Intra-op Plan:   Post-operative Plan: Extubation in OR  Informed Consent: I have reviewed the patients History and Physical, chart, labs and discussed the procedure including the risks, benefits and alternatives for the proposed anesthesia with the patient or authorized representative who has indicated his/her understanding and acceptance.   Dental advisory given  Plan Discussed with: CRNA and Surgeon  Anesthesia Plan Comments:         Anesthesia Quick Evaluation

## 2014-03-06 NOTE — Transfer of Care (Signed)
Immediate Anesthesia Transfer of Care Note  Patient: [name]  Procedure(s) Performed: Procedure(s): ANTERIOR CERVICAL DECOMPRESSION/DISCECTOMY FUSION 2 LEVELS four/five, five/six (N/A)  Patient Location: PACU  Anesthesia Type:General  Level of Consciousness: awake, alert , oriented and patient cooperative  Airway & Oxygen Therapy: Patient Spontanous Breathing and Patient connected to nasal cannula oxygen  Post-op Assessment: Report given to PACU RN, Post -op Vital signs reviewed and stable and Patient moving all extremities X 4  Post vital signs: Reviewed and stable  Complications: No apparent anesthesia complications

## 2014-03-06 NOTE — H&P (Signed)
Subjective:   Patient is a 43 y.o. female admitted for ACDF. The patient first presented to me with complaints of neck pain and arm pain. Onset of symptoms was several months ago. The pain is described as aching and stabbing and occurs all day. The pain is rated severe, and is located  Base of the neck and radiates to the right arm. The symptoms have been progressive. Symptoms are exacerbated by extending head backwards, and are relieved by none.  Previous work up includes MRI of cervical spine, results: disc bulge at C4-C5 and C5-C6 right.  Past Medical History  Diagnosis Date  . Stroke 05/2013  . History of TIAs   . Pneumonia   . Headache(784.0)   . Numbness     Right hand  . Anemia   . History of blood transfusion     Hx; of in 1991 after delivery    Past Surgical History  Procedure Laterality Date  . Cesarean section      x 1 with 5th pregnancy  . Cholecystectomy    . Tee without cardioversion N/A 05/21/2013    Procedure: TRANSESOPHAGEAL ECHOCARDIOGRAM (TEE);  Surgeon: Sueanne Margarita, MD;  Location: [facility] ENDOSCOPY;  Service: Cardiovascular;  Laterality: N/A;  . Tubal ligation      Allergies  Allergen Reactions  . Shellfish Allergy Itching and Swelling    History  Substance Use Topics  . Smoking status: Current Every Day Smoker -- 0.50 packs/day for 28 years    Types: Cigarettes  . Smokeless tobacco: Never Used     Comment: pt has script for Chantix  . Alcohol Use: No    Family History  Problem Relation Age of Onset  . Renal Disease Father     dialysis  . Hypertension Father   . Heart disease Mother     stabbed in heart   Prior to Admission medications   Medication Sig Start Date End Date Taking? Authorizing Provider  oxyCODONE-acetaminophen (PERCOCET) 10-325 MG per tablet Take 1 tablet by mouth every 8 (eight) hours as needed for pain.   Yes Historical Provider, MD  atorvastatin (LIPITOR) 40 MG tablet Take 1 tablet (40 mg total) by mouth daily at 6 PM. 01/07/14    Reyne Dumas, MD  clopidogrel (PLAVIX) 75 MG tablet Take 1 tablet (75 mg total) by mouth daily with breakfast. 01/07/14   Reyne Dumas, MD  varenicline (CHANTIX CONTINUING MONTH PAK) 1 MG tablet Take 1 tablet (1 mg total) by mouth 2 (two) times daily. 03/05/14   Dorothy Spark, MD  varenicline (CHANTIX STARTING MONTH PAK) 0.5 MG X 11 & 1 MG X 42 tablet Take one  tablet once daily for 3 days, then increase one tablet twice daily for 4 days, then increase to one 1 mg tablet twice daily. 03/05/14   Dorothy Spark, MD     Review of Systems  Positive ROS: Negative  All other systems have been reviewed and were otherwise negative with the exception of those mentioned in the HPI and as above.  Objective: Vital signs in last 24 hours: Temp:  [98.5 F (36.9 C)] 98.5 F (36.9 C) (04/29 1122) Pulse Rate:  [78] 78 (04/29 1122) BP: (122)/(73) 122/73 mmHg (04/29 1122) SpO2:  [96 %] 96 % (04/29 1122) Weight:  [92.987 kg (205 lb)-93.157 kg (205 lb 6 oz)] 92.987 kg (205 lb) (04/29 1123)  General Appearance: Alert, cooperative, no distress, appears stated age Head: Normocephalic, without obvious abnormality, atraumatic Eyes: PERRL, conjunctiva/corneas clear, EOM's intact  Neck: Supple, symmetrical, trachea midline, Back: Symmetric, no curvature, ROM normal, no CVA tenderness Lungs:  respirations unlabored Heart: Regular rate and rhythm Abdomen: Soft, non-tender Extremities: Extremities normal, atraumatic, no cyanosis or edema Pulses: 2+ and symmetric all extremities Skin: Skin color, texture, turgor normal, no rashes or lesions  NEUROLOGIC:  Mental status: Alert and oriented x4, no aphasia, good attention span, fund of knowledge and memory  Motor Exam - grossly normal Sensory Exam - grossly normal Reflexes: 1+ Coordination - grossly normal Gait - grossly normal Balance - grossly normal Cranial Nerves: I: smell Not tested  II: visual acuity  OS: nl    OD: nl  II: visual fields Full  to confrontation  II: pupils Equal, round, reactive to light  III,VII: ptosis None  III,IV,VI: extraocular muscles  Full ROM  V: mastication Normal  V: facial light touch sensation  Normal  V,VII: corneal reflex  Present  VII: facial muscle function - upper  Normal  VII: facial muscle function - lower Normal  VIII: hearing Not tested  IX: soft palate elevation  Normal  IX,X: gag reflex Present  XI: trapezius strength  5/5  XI: sternocleidomastoid strength 5/5  XI: neck flexion strength  5/5  XII: tongue strength  Normal    Data Review Lab Results  Component Value Date   WBC 7.4 03/05/2014   HGB 9.9* 03/05/2014   HCT 32.4* 03/05/2014   MCV 68.2* 03/05/2014   PLT 264 03/05/2014   Lab Results  Component Value Date   NA 138 03/05/2014   K 3.7 03/05/2014   CL 100 03/05/2014   CO2 25 03/05/2014   BUN 11 03/05/2014   CREATININE 0.83 03/05/2014   GLUCOSE 90 03/05/2014   Lab Results  Component Value Date   INR 0.96 08/23/2013    Assessment:   Cervical neck pain with herniated nucleus pulposus/ spondylosis/ stenosis at C4-5 C5-6. Patient has failed conservative therapy. Planned surgery : ACDF with plating C4-5 C5-6  Plan:   I explained the condition and procedure to the patient and answered any questions.  Patient wishes to proceed with procedure as planned. Understands risks/ benefits/ and expected or typical outcomes.  Eustace Moore 03/06/2014 1:21 PM

## 2014-03-07 MED ORDER — OXYCODONE-ACETAMINOPHEN 10-325 MG PO TABS
1.0000 | ORAL_TABLET | ORAL | Status: DC | PRN
Start: 2014-03-07 — End: 2015-03-11

## 2014-03-07 MED ORDER — METHOCARBAMOL 500 MG PO TABS: 500.0000 mg | ORAL_TABLET | Freq: Four times a day (QID) | ORAL | Status: DC | PRN

## 2014-03-07 NOTE — Discharge Summary (Signed)
Physician Discharge Summary  Patient ID: [name] MRN: [id number] DOB/AGE: [birth year] 43 y.o.  Admit date: 03/06/2014 Discharge date: 03/07/2014  Admission Diagnoses: Cervical spondylosis    Discharge Diagnoses: Same   Discharged Condition: good  Hospital Course: The patient was admitted on 03/06/2014 and taken to the operating room where the patient underwent ACDF C4-5 C5-6. The patient tolerated the procedure well and was taken to the recovery room and then to the floor in stable condition. The hospital course was routine. There were no complications. The wound remained clean dry and intact. Pt had appropriate neck soreness. No complaints of arm pain or new N/T/W. The patient remained afebrile with stable vital signs, and tolerated a regular diet. The patient continued to increase activities, and pain was well controlled with oral pain medications.   Consults: None  Significant Diagnostic Studies:  Results for orders placed during the hospital encounter of 03/06/14  GLUCOSE, CAPILLARY      Result Value Ref Range   Glucose-Capillary 93  70 - 99 mg/dL  TYPE AND SCREEN      Result Value Ref Range   ABO/RH(D) B POS     Antibody Screen NEG     Sample Expiration 03/09/2014    ABO/RH      Result Value Ref Range   ABO/RH(D) B POS      Dg Chest 2 View  03/05/2014   CLINICAL DATA:  Preop for cervical spine surgery. Slight cough for 1 week.  EXAM: CHEST  2 VIEW  COMPARISON:  09/07/2011  FINDINGS: The heart size and mediastinal contours are within normal limits. Both lungs are clear. The visualized skeletal structures are unremarkable.  IMPRESSION: No active cardiopulmonary disease.   Electronically Signed   By: Lajean Manes M.D.   On: 03/05/2014 15:41   Dg Cervical Spine 2-3 Views  03/06/2014   CLINICAL DATA:  Neck pain  EXAM: CERVICAL SPINE - 2-3 VIEW; DG C-ARM 1-60 MIN  COMPARISON:  None.  FINDINGS: C-arm radiographs demonstrate localization and performance of a C4-C6 anterior  cervical discectomy with interbody fusion.  IMPRESSION: Satisfactory position and alignment.   Electronically Signed   By: Rolla Flatten M.D.   On: 03/06/2014 16:20   Dg C-arm 1-60 Min  03/06/2014   CLINICAL DATA:  Neck pain  EXAM: CERVICAL SPINE - 2-3 VIEW; DG C-ARM 1-60 MIN  COMPARISON:  None.  FINDINGS: C-arm radiographs demonstrate localization and performance of a C4-C6 anterior cervical discectomy with interbody fusion.  IMPRESSION: Satisfactory position and alignment.   Electronically Signed   By: Rolla Flatten M.D.   On: 03/06/2014 16:20    Antibiotics:  Anti-infectives   Start     Dose/Rate Route Frequency Ordered Stop   03/06/14 2200  ceFAZolin (ANCEF) IVPB 1 g/50 mL premix     1 g 100 mL/hr over 30 Minutes Intravenous Every 8 hours 03/06/14 1720 03/07/14 1359   03/06/14 1425  bacitracin 50,000 Units in sodium chloride irrigation 0.9 % 500 mL irrigation  Status:  Discontinued       As needed 03/06/14 1425 03/06/14 1556   03/06/14 1115  ceFAZolin (ANCEF) IVPB 2 g/50 mL premix     2 g 100 mL/hr over 30 Minutes Intravenous On call to O.R. 03/06/14 1113 03/06/14 1345      Discharge Exam: Blood pressure 123/91, pulse 62, temperature 98.5 F (36.9 C), temperature source Oral, resp. rate 18, height 5\' 4"  (1.626 m), weight 92.987 kg (205 lb), last menstrual period 02/06/2014, SpO2  93.00%. Neurologic: Grossly normal, good strength, eating well Incision clean dry and intact  Discharge Medications:     Medication List         atorvastatin 40 MG tablet  Commonly known as:  LIPITOR  Take 1 tablet (40 mg total) by mouth daily at 6 PM.     clopidogrel 75 MG tablet  Commonly known as:  PLAVIX  Take 1 tablet (75 mg total) by mouth daily with breakfast.     methocarbamol 500 MG tablet  Commonly known as:  ROBAXIN  Take 1 tablet (500 mg total) by mouth every 6 (six) hours as needed for muscle spasms.     oxyCODONE-acetaminophen 10-325 MG per tablet  Commonly known as:  PERCOCET   Take 1 tablet by mouth every 4 (four) hours as needed for pain.     varenicline 1 MG tablet  Commonly known as:  CHANTIX CONTINUING MONTH PAK  Take 1 tablet (1 mg total) by mouth 2 (two) times daily.     varenicline 0.5 MG X 11 & 1 MG X 42 tablet  Commonly known as:  CHANTIX STARTING MONTH PAK  Take one  tablet once daily for 3 days, then increase one tablet twice daily for 4 days, then increase to one 1 mg tablet twice daily.        Disposition: Home   Final Dx: ACDF C4-5 C5-6      Discharge Orders   Future Appointments Provider Department Dept Phone   04/09/2014 11:45 AM Angelica Chessman, MD [facility] [phone]   07/25/2014 2:15 PM Garvin Fila, MD [facility] [phone]   Future Orders Complete By Expires   Call MD for:  difficulty breathing, headache or visual disturbances  As directed    Call MD for:  persistant nausea and vomiting  As directed    Call MD for:  redness, tenderness, or signs of infection (pain, swelling, redness, odor or green/yellow discharge around incision site)  As directed    Call MD for:  severe uncontrolled pain  As directed    Call MD for:  temperature >100.4  As directed    Diet - low sodium heart healthy  As directed    Discharge instructions  As directed    Increase activity slowly  As directed    Remove dressing in 48 hours  As directed       Follow-up Information   Follow up with JONES,DAVID S, MD In 2 weeks.   Specialty:  Neurosurgery   Contact information:   [address] [place] [address] [phone]        Signed: Eustace Moore 03/07/2014, 9:25 AM

## 2014-03-07 NOTE — Progress Notes (Signed)
Pt. discharged home accompanied by husband. Prescriptions and discharge instructions given with verbalization of understanding. Incision site on neck with no s/s of infection - no swelling, redness, bleeding, and/or drainage noted. Soft collar intact. Patient discharged home with family. Opportunity given to ask questions but no question asked. Pt. transported out of this unit in wheelchair by the volunteer.

## 2014-03-08 ENCOUNTER — Encounter (HOSPITAL_COMMUNITY): Payer: Self-pay | Admitting: Neurological Surgery

## 2014-03-08 NOTE — Anesthesia Postprocedure Evaluation (Signed)
  Anesthesia Post-op Note  Patient: [name]  Procedure(s) Performed: Procedure(s): ANTERIOR CERVICAL DECOMPRESSION/DISCECTOMY FUSION 2 LEVELS four/five, five/six (N/A)  Patient Location: PACU  Anesthesia Type:General  Level of Consciousness: awake and alert   Airway and Oxygen Therapy: Patient Spontanous Breathing  Post-op Pain: mild  Post-op Assessment: Post-op Vital signs reviewed  Post-op Vital Signs: stable  Last Vitals:  Filed Vitals:   03/07/14 0748  BP: 123/91  Pulse: 62  Temp: 36.9 C  Resp: 18    Complications: No apparent anesthesia complications

## 2014-03-10 ENCOUNTER — Encounter (HOSPITAL_COMMUNITY): Payer: Self-pay | Admitting: Emergency Medicine

## 2014-03-10 ENCOUNTER — Emergency Department (HOSPITAL_COMMUNITY): Payer: BC Managed Care – PPO

## 2014-03-10 ENCOUNTER — Emergency Department (HOSPITAL_COMMUNITY)
Admission: EM | Admit: 2014-03-10 | Discharge: 2014-03-10 | Disposition: A | Discharge status: Home / Self Care | Payer: BC Managed Care – PPO | Attending: Emergency Medicine | Admitting: Emergency Medicine

## 2014-03-10 DIAGNOSIS — R0602 Shortness of breath: Secondary | ICD-10-CM | POA: Insufficient documentation

## 2014-03-10 DIAGNOSIS — B370 Candidal stomatitis: Secondary | ICD-10-CM | POA: Insufficient documentation

## 2014-03-10 DIAGNOSIS — Z9089 Acquired absence of other organs: Secondary | ICD-10-CM | POA: Insufficient documentation

## 2014-03-10 DIAGNOSIS — Z7902 Long term (current) use of antithrombotics/antiplatelets: Secondary | ICD-10-CM | POA: Insufficient documentation

## 2014-03-10 DIAGNOSIS — Z8701 Personal history of pneumonia (recurrent): Secondary | ICD-10-CM | POA: Insufficient documentation

## 2014-03-10 DIAGNOSIS — J029 Acute pharyngitis, unspecified: Secondary | ICD-10-CM | POA: Insufficient documentation

## 2014-03-10 DIAGNOSIS — B3781 Candidal esophagitis: Secondary | ICD-10-CM | POA: Insufficient documentation

## 2014-03-10 DIAGNOSIS — Z981 Arthrodesis status: Secondary | ICD-10-CM | POA: Insufficient documentation

## 2014-03-10 DIAGNOSIS — F17200 Nicotine dependence, unspecified, uncomplicated: Secondary | ICD-10-CM | POA: Insufficient documentation

## 2014-03-10 DIAGNOSIS — Z79899 Other long term (current) drug therapy: Secondary | ICD-10-CM | POA: Insufficient documentation

## 2014-03-10 DIAGNOSIS — Z8673 Personal history of transient ischemic attack (TIA), and cerebral infarction without residual deficits: Secondary | ICD-10-CM | POA: Insufficient documentation

## 2014-03-10 LAB — BASIC METABOLIC PANEL
BUN: 11 mg/dL (ref 6–23)
CHLORIDE: 100 meq/L (ref 96–112)
CO2: 23 meq/L (ref 19–32)
CREATININE: 0.71 mg/dL (ref 0.50–1.10)
Calcium: 8.7 mg/dL (ref 8.4–10.5)
GFR calc Af Amer: >90 mL/min (ref >90)
GFR calc non Af Amer: >90 mL/min (ref >90)
GLUCOSE: 90 mg/dL (ref 70–99)
Potassium: 3.7 mEq/L (ref 3.7–5.3)
Sodium: 139 mEq/L (ref 137–147)

## 2014-03-10 LAB — CBC
HEMATOCRIT: 31.9 % — AB (ref 36.0–46.0)
HEMOGLOBIN: 9.6 g/dL — AB (ref 12.0–15.0)
MCH: 20.8 pg — AB (ref 26.0–34.0)
MCHC: 30.1 g/dL (ref 30.0–36.0)
MCV: 69.2 fL — AB (ref 78.0–100.0)
Platelets: 315 10*3/uL (ref 150–400)
RBC: 4.61 MIL/uL (ref 3.87–5.11)
RDW: 20.6 % — ABNORMAL HIGH (ref 11.5–15.5)
WBC: 8.8 10*3/uL (ref 4.0–10.5)

## 2014-03-10 LAB — I-STAT TROPONIN, ED: Troponin i, poc: 0.01 ng/mL (ref 0.00–0.08)

## 2014-03-10 MED ORDER — IOHEXOL 300 MG/ML  SOLN
75.0000 mL | Freq: Once | INTRAMUSCULAR | Status: AC | PRN
  Administered 2014-03-10: 75 mL via INTRAVENOUS

## 2014-03-10 MED ORDER — FLUCONAZOLE 200 MG PO TABS: 200.0000 mg | ORAL_TABLET | Freq: Every day | ORAL | Status: AC

## 2014-03-10 MED ORDER — FLUCONAZOLE 100 MG PO TABS
200.0000 mg | ORAL_TABLET | Freq: Once | ORAL | Status: AC
  Administered 2014-03-10: 200 mg via ORAL
  Filled 2014-03-10: qty 2

## 2014-03-10 MED ORDER — OXYCODONE-ACETAMINOPHEN 5-325 MG PO TABS: 1.0000 | ORAL_TABLET | Freq: Once | ORAL | Status: DC

## 2014-03-10 NOTE — Discharge Instructions (Signed)
Esophagitis  Esophagitis is inflammation of the esophagus. It can involve swelling, soreness, and pain in the esophagus. This condition can make it difficult and painful to swallow.  CAUSES   Most causes of esophagitis are not serious. Many different factors can cause esophagitis, including:   Gastroesophageal reflux disease (GERD). This is when acid from your stomach flows up into the esophagus.   Recurrent vomiting.   An allergic-type reaction.   Certain medicines, especially those that come in large pills.   Ingestion of harmful chemicals, such as household cleaning products.   Heavy alcohol use.   An infection of the esophagus.   Radiation treatment for cancer.   Certain diseases such as sarcoidosis, Crohn's disease, and scleroderma. These diseases may cause recurrent esophagitis.  SYMPTOMS    Trouble swallowing.   Painful swallowing.   Chest pain.   Difficulty breathing.   Nausea.   Vomiting.   Abdominal pain.  DIAGNOSIS   Your caregiver will take your history and do a physical exam. Depending upon what your caregiver finds, certain tests may also be done, including:   Barium X-ray. You will drink a solution that coats the esophagus, and X-rays will be taken.   Endoscopy. A lighted tube is put down the esophagus so your caregiver can examine the area.   Allergy tests. These can sometimes be arranged through follow-up visits.  TREATMENT   Treatment will depend on the cause of your esophagitis. In some cases, steroids or other medicines may be given to help relieve your symptoms or to treat the underlying cause of your condition. Medicines that may be recommended include:   Viscous lidocaine, to soothe the esophagus.   Antacids.   Acid reducers.   Proton pump inhibitors.   Antiviral medicines for certain viral infections of the esophagus.   Antifungal medicines for certain fungal infections of the esophagus.   Antibiotic medicines, depending on the cause of the esophagitis.  HOME CARE  INSTRUCTIONS    Avoid foods and drinks that seem to make your symptoms worse.   Eat small, frequent meals instead of large meals.   Avoid eating for the 3 hours prior to your bedtime.   If you have trouble taking pills, use a pill splitter to decrease the size and likelihood of the pill getting stuck or injuring the esophagus on the way down. Drinking water after taking a pill also helps.   Stop smoking if you smoke.   Maintain a healthy weight.   Wear loose-fitting clothing. Do not wear anything tight around your waist that causes pressure on your stomach.   Raise the head of your bed 6 to 8 inches with wood blocks to help you sleep. Extra pillows will not help.   Only take over-the-counter or prescription medicines as directed by your caregiver.  SEEK IMMEDIATE MEDICAL CARE IF:   You have severe chest pain that radiates into your arm, neck, or jaw.   You feel sweaty, dizzy, or lightheaded.   You have shortness of breath.   You vomit blood.   You have difficulty or pain with swallowing.   You have bloody or black, tarry stools.   You have a fever.   You have a burning sensation in the chest more than 3 times a week for more than 2 weeks.   You cannot swallow, drink, or eat.   You drool because you cannot swallow your saliva.  MAKE SURE YOU:   Understand these instructions.   Will watch your condition.     Will get help right away if you are not doing well or get worse.  Document Released: 12/02/2004 Document Revised: 01/17/2012 Document Reviewed: 06/25/2011  Exit[employer] Patient Information 2014 [employer].

## 2014-03-10 NOTE — ED Provider Notes (Signed)
Medical screening examination/treatment/procedure(s) were conducted as a shared visit with non-physician practitioner(s) and myself.  I personally evaluated the patient during the encounter.   EKG Interpretation   Date/Time:  Sunday Mar 10 2014 13:16:13 EDT Ventricular Rate:  107 PR Interval:  126 QRS Duration: 72 QT Interval:  356 QTC Calculation: 475 R Axis:   28 Text Interpretation:  Sinus tachycardia Nonspecific ST and T wave  abnormality Abnormal ECG No significant change since last tracing  Confirmed by Stavros Cail,  DO, Milik Gilreath [phone]) on 03/10/2014 1:45:25 PM      Pt is a 43 y.o. F with history of prior stroke on Plavix who recently had anterior cervical spine fusion by Dr. Ronnald Ramp on 4/29 his woke up with tightness in her neck and shortness of breath. Patient denies chest pain. She has had some sore throat and neck pain. No numbness or focal weakness. On exam, patient has thrush in her posterior oropharynx but no uvular deviation, trismus, drooling. No stridor or wheezing. No angioedema, tongue swelling. She denies a history of being immunocompromised. She was last checked for HIV approximately one year ago. Her incision site is clean, dry and intact in her anterior right neck. She has no meningismus on exam. She is neurologically intact. Her labs including troponin is negative. EKG shows no new ischemic changes. CT of her soft tissues of her neck pending to evaluate for infection. Plan is to discuss with neurosurgery for disposition. Patient will need treatment for her thrush and likely candidal esophagitis.  [place], DO 03/10/14 1704

## 2014-03-10 NOTE — ED Provider Notes (Signed)
CSN: [id number]     Arrival date & time 03/10/14  1307 History   First MD Initiated Contact with Patient 03/10/14 1330     Chief Complaint  Patient presents with  . Chest Pain  . Shortness of Breath     (Consider location/radiation/quality/duration/timing/severity/associated sxs/prior Treatment) HPI Comments: Patient with history of cervical fusion surgery performed on 03/06/14, Plavix for previous stroke/TIA -- presents with complaints of a tight sensation in her neck with shortness of breath and multiple episodes of vomiting that began at approximately 7:30 AM after she awoke from sleep. Patient states it feels like she has mucus in her throat and needs to get it up. She has not had fever or worsening pain. No drainage from the surgical site. She denies chest pain. No significant cardiac history. No steroids. Taking percocet at home for pain. The onset of this condition was acute. The course is constant. Aggravating factors: none. Alleviating factors: none.     Patient is a 43 y.o. female presenting with chest pain and shortness of breath. The history is provided by the patient.  Chest Pain Associated symptoms: dysphagia (mild pain with swallowing) and shortness of breath   Associated symptoms: no abdominal pain, no cough, no fever, no headache, no nausea and not vomiting   Shortness of Breath Associated symptoms: sore throat   Associated symptoms: no abdominal pain, no chest pain, no cough, no fever, no headaches, no rash and no vomiting     Past Medical History  Diagnosis Date  . Stroke 05/2013  . History of TIAs   . Pneumonia   . Headache(784.0)   . Numbness     Right hand  . Anemia   . History of blood transfusion     Hx; of in 1991 after delivery   Past Surgical History  Procedure Laterality Date  . Cesarean section      x 1 with 5th pregnancy  . Cholecystectomy    . Tee without cardioversion N/A 05/21/2013    Procedure: TRANSESOPHAGEAL ECHOCARDIOGRAM (TEE);  Surgeon:  Sueanne Margarita, MD;  Location: [facility] ENDOSCOPY;  Service: Cardiovascular;  Laterality: N/A;  . Tubal ligation    . Anterior cervical decomp/discectomy fusion N/A 03/06/2014    Procedure: ANTERIOR CERVICAL DECOMPRESSION/DISCECTOMY FUSION 2 LEVELS four/five, five/six;  Surgeon: Eustace Moore, MD;  Location: [facility] NEURO ORS;  Service: Neurosurgery;  Laterality: N/A;   Family History  Problem Relation Age of Onset  . Renal Disease Father     dialysis  . Hypertension Father   . Heart disease Mother     stabbed in heart   History  Substance Use Topics  . Smoking status: Current Every Day Smoker -- 0.50 packs/day for 28 years    Types: Cigarettes  . Smokeless tobacco: Never Used     Comment: pt has script for Chantix  . Alcohol Use: No   OB History   Grav Para Term Preterm Abortions TAB SAB Ect Mult Living                 Review of Systems  Constitutional: Negative for fever.  HENT: Positive for sore throat and trouble swallowing (mild pain with swallowing). Negative for rhinorrhea.   Eyes: Negative for redness.  Respiratory: Positive for shortness of breath. Negative for cough.   Cardiovascular: Negative for chest pain.  Gastrointestinal: Negative for nausea, vomiting, abdominal pain and diarrhea.  Genitourinary: Negative for dysuria.  Musculoskeletal: Negative for myalgias.  Skin: Negative for rash.  Neurological: Negative for  headaches.    Allergies  Shellfish allergy  Home Medications   Prior to Admission medications   Medication Sig Start Date End Date Taking? Authorizing Provider  atorvastatin (LIPITOR) 40 MG tablet Take 1 tablet (40 mg total) by mouth daily at 6 PM. 01/07/14   Reyne Dumas, MD  clopidogrel (PLAVIX) 75 MG tablet Take 1 tablet (75 mg total) by mouth daily with breakfast. 01/07/14   Reyne Dumas, MD  methocarbamol (ROBAXIN) 500 MG tablet Take 1 tablet (500 mg total) by mouth every 6 (six) hours as needed for muscle spasms. 03/07/14   Eustace Moore, MD   oxyCODONE-acetaminophen (PERCOCET) 10-325 MG per tablet Take 1 tablet by mouth every 4 (four) hours as needed for pain. 03/07/14   Eustace Moore, MD  varenicline (CHANTIX CONTINUING MONTH PAK) 1 MG tablet Take 1 tablet (1 mg total) by mouth 2 (two) times daily. 03/05/14   Dorothy Spark, MD  varenicline (CHANTIX STARTING MONTH PAK) 0.5 MG X 11 & 1 MG X 42 tablet Take one  tablet once daily for 3 days, then increase one tablet twice daily for 4 days, then increase to one 1 mg tablet twice daily. 03/05/14   Dorothy Spark, MD   BP 129/88  Pulse 94  Temp(Src) 99.3 F (37.4 C) (Oral)  Resp 20  Ht 5\' 3"  (1.6 m)  Wt 206 lb (93.441 kg)  BMI 36.50 kg/m2  SpO2 99%  LMP 02/06/2014  Physical Exam  Nursing note and vitals reviewed. Constitutional: She appears well-developed and well-nourished.  HENT:  Head: Normocephalic and atraumatic.  Thrush covering pharynx and tongue. Clearing throat frequently during exam.   Eyes: Conjunctivae are normal. Pupils are equal, round, and reactive to light. Right eye exhibits no discharge. Left eye exhibits no discharge.  Neck: Normal range of motion. Neck supple.  Cardiovascular: Normal rate, regular rhythm and normal heart sounds.   No murmur heard. Pulmonary/Chest: Effort normal and breath sounds normal. No stridor. No respiratory distress. She has no wheezes. She has no rales.  Abdominal: Soft. There is no tenderness.  Neurological: She is alert.  Skin: Skin is warm and dry.  Psychiatric: She has a normal mood and affect.    ED Course  Procedures (including critical care time) Labs Review Labs Reviewed  CBC - Abnormal; Notable for the following:    Hemoglobin 9.6 (*)    HCT 31.9 (*)    MCV 69.2 (*)    MCH 20.8 (*)    RDW 20.6 (*)    All other components within normal limits  BASIC METABOLIC PANEL  HIV ANTIBODY (ROUTINE TESTING)  I-STAT TROPOININ, ED    Imaging Review No results found.   EKG Interpretation   Date/Time:  Sunday Mar 10 2014 13:16:13 EDT Ventricular Rate:  107 PR Interval:  126 QRS Duration: 72 QT Interval:  356 QTC Calculation: 475 R Axis:   28 Text Interpretation:  Sinus tachycardia Nonspecific ST and T wave  abnormality Abnormal ECG No significant change since last tracing  Confirmed by WARD,  DO, KRISTEN ([id number]) on 03/10/2014 1:45:25 PM      1:49 PM Patient seen and examined. Work-up initiated. Medications ordered.   Vital signs reviewed and are as follows: Filed Vitals:   03/10/14 1343  BP: 129/88  Pulse: 94  Temp:   Resp: 20   3:32 PM Patient seen by Dr. Leonides Schanz. Pending CT neck to r/o infection. Her exam is stable.   3:57 PM Handoff to next shift  at shift change. Pending CT.  Will need treated for thrush.    MDM   Final diagnoses:  Candidal esophagitis   Pending completion of work-up.    Carlisle Cater, PA-C 03/10/14 [phone]

## 2014-03-10 NOTE — ED Notes (Signed)
Pt reports that she had spine surgery on Wednesday. Today when she woke up she felt tightness in her chest and shortness of breath with vomiting. Pt has steri strips in place on her neck.

## 2014-03-10 NOTE — ED Provider Notes (Signed)
I received the patient at check out from [place], [place].  Briefly, this is a 43 y.o. female about 5 days status post ACDF, presenting with "tightness" in her throat.  Examination is within normal limits, with the exception of oral candidiasis. Considering the recent surgery, CT neck soft tissues been ordered. Considering oral candidiasis, presenting complaints, constant clearing of throat, there is concern for candidal esophagitis at this time. HIV test has been sent.  Plan is to followup CT neck soft tissues, speak with neurosurgery, counsel patient on workup, followup.  CT neck reveals normal postoperative changes. The patient remained stable. I've spoken with Dr. Kathyrn Sheriff with neurosurgery. We have discussed case. He agrees with discharge. I will discharge the patient at this time after giving initial dose of Diflucan, prescribing 21 days of Diflucan.  Infectious disease will followup with her pertaining to her HIV testing. I have also provided her with her home dose of Percocet while in the department, as she is having normal postoperative pain.  I have discussed case and care has been guided by my attending physician, Dr. Maryan Rued.  Doy Hutching, MD 03/10/14 2337

## 2014-03-11 LAB — HIV ANTIBODY (ROUTINE TESTING W REFLEX): HIV 1&2 Ab, 4th Generation: NONREACTIVE

## 2014-03-13 NOTE — ED Provider Notes (Signed)
I saw and evaluated the patient, reviewed the resident's note and I agree with the findings and plan.   EKG Interpretation   Date/Time:  Sunday Mar 10 2014 13:16:13 EDT Ventricular Rate:  107 PR Interval:  126 QRS Duration: 72 QT Interval:  356 QTC Calculation: 475 R Axis:   28 Text Interpretation:  Sinus tachycardia Nonspecific ST and T wave  abnormality Abnormal ECG No significant change since last tracing  Confirmed by WARD,  DO, KRISTEN [phone]) on 03/10/2014 1:45:25 PM        Blanchie Dessert, MD 03/13/14 1410

## 2014-03-25 DIAGNOSIS — M4722 Other spondylosis with radiculopathy, cervical region: Secondary | ICD-10-CM | POA: Insufficient documentation

## 2014-04-09 ENCOUNTER — Encounter: Payer: Self-pay | Admitting: Internal Medicine

## 2014-04-09 ENCOUNTER — Ambulatory Visit: Payer: BC Managed Care – PPO | Attending: Internal Medicine | Admitting: Internal Medicine

## 2014-04-09 ENCOUNTER — Ambulatory Visit (HOSPITAL_COMMUNITY)
Admission: RE | Admit: 2014-04-09 | Discharge: 2014-04-09 | Disposition: A | Discharge status: Home / Self Care | Payer: BC Managed Care – PPO | Source: Ambulatory Visit | Attending: Internal Medicine | Admitting: Internal Medicine

## 2014-04-09 VITALS — BP 119/83 | HR 79 | Temp 98.7°F | Resp 16 | Wt 205.6 lb

## 2014-04-09 DIAGNOSIS — M25539 Pain in unspecified wrist: Secondary | ICD-10-CM

## 2014-04-09 DIAGNOSIS — F17200 Nicotine dependence, unspecified, uncomplicated: Secondary | ICD-10-CM | POA: Insufficient documentation

## 2014-04-09 DIAGNOSIS — M259 Joint disorder, unspecified: Secondary | ICD-10-CM | POA: Insufficient documentation

## 2014-04-09 DIAGNOSIS — Z7901 Long term (current) use of anticoagulants: Secondary | ICD-10-CM | POA: Insufficient documentation

## 2014-04-09 DIAGNOSIS — I6350 Cerebral infarction due to unspecified occlusion or stenosis of unspecified cerebral artery: Secondary | ICD-10-CM

## 2014-04-09 DIAGNOSIS — G4700 Insomnia, unspecified: Secondary | ICD-10-CM | POA: Insufficient documentation

## 2014-04-09 DIAGNOSIS — Z8673 Personal history of transient ischemic attack (TIA), and cerebral infarction without residual deficits: Secondary | ICD-10-CM | POA: Insufficient documentation

## 2014-04-09 DIAGNOSIS — E785 Hyperlipidemia, unspecified: Secondary | ICD-10-CM | POA: Insufficient documentation

## 2014-04-09 DIAGNOSIS — I639 Cerebral infarction, unspecified: Secondary | ICD-10-CM

## 2014-04-09 MED ORDER — TRAZODONE HCL 50 MG PO TABS: 25.0000 mg | ORAL_TABLET | Freq: Every evening | ORAL | Status: DC | PRN

## 2014-04-09 NOTE — Progress Notes (Signed)
Patient forgot to  Talk to doctor about "knot" to her right wrist Would like it xrayed Order put in epic for x ray

## 2014-04-09 NOTE — Progress Notes (Signed)
MRN: [id number] Name: [name]  Sex: female Age: 43 y.o. DOB: 1971-09-22  Allergies: Shellfish allergy  Chief Complaint  Patient presents with  . Follow-up    HPI: Patient is 43 y.o. female who has history of CVA, Delia patient is taking Plavix and following up with the neurologist, she comes today for followup, she does smoke cigarettes, has already been prescribed Chantix and as per patient she is trying to quit smoking, she also reported to have history of insomnia and was prescribed in the in the past.  Past Medical History  Diagnosis Date  . Stroke 05/2013  . History of TIAs   . Pneumonia   . Headache(784.0)   . Numbness     Right hand  . Anemia   . History of blood transfusion     Hx; of in 1991 after delivery    Past Surgical History  Procedure Laterality Date  . Cesarean section      x 1 with 5th pregnancy  . Cholecystectomy    . Tee without cardioversion N/A 05/21/2013    Procedure: TRANSESOPHAGEAL ECHOCARDIOGRAM (TEE);  Surgeon: Sueanne Margarita, MD;  Location: [facility] ENDOSCOPY;  Service: Cardiovascular;  Laterality: N/A;  . Tubal ligation    . Anterior cervical decomp/discectomy fusion N/A 03/06/2014    Procedure: ANTERIOR CERVICAL DECOMPRESSION/DISCECTOMY FUSION 2 LEVELS four/five, five/six;  Surgeon: Eustace Moore, MD;  Location: [facility] NEURO ORS;  Service: Neurosurgery;  Laterality: N/A;      Medication List       This list is accurate as of: 04/09/14 12:30 PM.  Always use your most recent med list.               atorvastatin 40 MG tablet  Commonly known as:  LIPITOR  Take 1 tablet (40 mg total) by mouth daily at 6 PM.     bisacodyl 5 MG EC tablet  Commonly known as:  DULCOLAX  Take 5 mg by mouth daily as needed for moderate constipation.     clopidogrel 75 MG tablet  Commonly known as:  PLAVIX  Take 75 mg by mouth daily with breakfast.     diphenhydrAMINE 25 MG tablet  Commonly known as:  BENADRYL  Take 25 mg by mouth every 6 (six) hours as  needed for itching.     methocarbamol 500 MG tablet  Commonly known as:  ROBAXIN  Take 1 tablet (500 mg total) by mouth every 6 (six) hours as needed for muscle spasms.     oxyCODONE-acetaminophen 10-325 MG per tablet  Commonly known as:  PERCOCET  Take 1 tablet by mouth every 4 (four) hours as needed for pain.     STOOL SOFTENER PO  Take 1 tablet by mouth daily as needed (Constipation).     traZODone 50 MG tablet  Commonly known as:  DESYREL  Take 0.5-1 tablets (25-50 mg total) by mouth at bedtime as needed for sleep.     varenicline 1 MG tablet  Commonly known as:  CHANTIX CONTINUING MONTH PAK  Take 1 tablet (1 mg total) by mouth 2 (two) times daily.     varenicline 0.5 MG X 11 & 1 MG X 42 tablet  Commonly known as:  CHANTIX STARTING MONTH PAK  Take one  tablet once daily for 3 days, then increase one tablet twice daily for 4 days, then increase to one 1 mg tablet twice daily.        Meds ordered this encounter  Medications  .  traZODone (DESYREL) 50 MG tablet    Sig: Take 0.5-1 tablets (25-50 mg total) by mouth at bedtime as needed for sleep.    Dispense:  30 tablet    Refill:  3    Immunization History  Administered Date(s) Administered  . Pneumococcal Polysaccharide-23 05/21/2013    Family History  Problem Relation Age of Onset  . Renal Disease Father     dialysis  . Hypertension Father   . Heart disease Mother     stabbed in heart    History  Substance Use Topics  . Smoking status: Current Every Day Smoker -- 0.50 packs/day for 28 years    Types: Cigarettes  . Smokeless tobacco: Never Used     Comment: pt has script for Chantix  . Alcohol Use: No    Review of Systems   As noted in HPI  Filed Vitals:   04/09/14 1137  BP: 119/83  Pulse: 79  Temp: 98.7 F (37.1 C)  Resp: 16    Physical Exam  Physical Exam  Constitutional: No distress.  Eyes: EOM are normal. Pupils are equal, round, and reactive to light.  Cardiovascular: Normal rate and  regular rhythm.   Pulmonary/Chest: Breath sounds normal. No respiratory distress. She has no wheezes. She has no rales.  Musculoskeletal: She exhibits no edema.    CBC    Component Value Date/Time   WBC 8.8 03/10/2014 1341   RBC 4.61 03/10/2014 1341   RBC 4.75 01/11/2014 1155   HGB 9.6* 03/10/2014 1341   HCT 31.9* 03/10/2014 1341   PLT 315 03/10/2014 1341   MCV 69.2* 03/10/2014 1341   LYMPHSABS 3.3 01/07/2014 1033   MONOABS 0.4 01/07/2014 1033   EOSABS 0.1 01/07/2014 1033   BASOSABS 0.0 01/07/2014 1033    CMP     Component Value Date/Time   NA 139 03/10/2014 1341   K 3.7 03/10/2014 1341   CL 100 03/10/2014 1341   CO2 23 03/10/2014 1341   GLUCOSE 90 03/10/2014 1341   BUN 11 03/10/2014 1341   CREATININE 0.71 03/10/2014 1341   CREATININE 0.88 01/07/2014 1033   CALCIUM 8.7 03/10/2014 1341   PROT 7.5 01/08/2014 1213   ALBUMIN 3.7 01/08/2014 1213   AST 17 01/08/2014 1213   ALT 9 01/08/2014 1213   ALKPHOS 73 01/08/2014 1213   BILITOT <0.2* 01/08/2014 1213   GFRNONAA >90 03/10/2014 1341   GFRNONAA 80 01/07/2014 1033   GFRAA >90 03/10/2014 1341   GFRAA >89 01/07/2014 1033    Lab Results  Component Value Date/Time   CHOL 210* 01/07/2014 10:33 AM    No components found with this basename: hga1c    Lab Results  Component Value Date/Time   AST 17 01/08/2014 12:13 PM    Assessment and Plan  CVA (cerebral infarction) - Plan: Continue with Plavix and Lipitor, Will repeat blood chemistry COMPLETE METABOLIC PANEL WITH GFR  Tobacco use disorder Patient already has prescription for Chantix she will try to quit smoking.  Other and unspecified hyperlipidemia - Plan: Will repeat her Lipid panel, she is on Lipitor 40 mg  Insomnia - Plan: Advised patient for sleep hygiene trial of traZODone (DESYREL) 50 MG tablet   Return in about 3 months (around 07/10/2014) for hyperipidemia, CVA.  Lorayne Marek, MD

## 2014-04-09 NOTE — Progress Notes (Signed)
Patient here for follow up History of CVA and TIA's

## 2014-04-10 ENCOUNTER — Telehealth: Payer: Self-pay

## 2014-04-10 LAB — COMPLETE METABOLIC PANEL WITH GFR
ALK PHOS: 73 U/L (ref 39–117)
ALT: 8 U/L (ref 0–35)
AST: 12 U/L (ref 0–37)
Albumin: 3.8 g/dL (ref 3.5–5.2)
BUN: 8 mg/dL (ref 6–23)
CO2: 26 meq/L (ref 19–32)
Calcium: 8.8 mg/dL (ref 8.4–10.5)
Chloride: 100 mEq/L (ref 96–112)
Creat: 0.83 mg/dL (ref 0.50–1.10)
GFR, Est Non African American: 86 mL/min
Glucose, Bld: 82 mg/dL (ref 70–99)
Potassium: 3.8 mEq/L (ref 3.5–5.3)
Sodium: 139 mEq/L (ref 135–145)
Total Bilirubin: 0.2 mg/dL (ref 0.2–1.2)
Total Protein: 6.7 g/dL (ref 6.0–8.3)

## 2014-04-10 LAB — LIPID PANEL
CHOL/HDL RATIO: 4.7 ratio
Cholesterol: 241 mg/dL — ABNORMAL HIGH (ref 0–200)
HDL: 51 mg/dL (ref >39)
LDL Cholesterol: 158 mg/dL — ABNORMAL HIGH (ref 0–99)
Triglycerides: 160 mg/dL — ABNORMAL HIGH (ref <150)
VLDL: 32 mg/dL (ref 0–40)

## 2014-04-10 MED ORDER — ATORVASTATIN CALCIUM 80 MG PO TABS: 80.0000 mg | ORAL_TABLET | Freq: Every day | ORAL | Status: DC

## 2014-04-10 NOTE — Telephone Encounter (Signed)
Message copied by Dorothe Pea on Wed Apr 10, 2014 12:20 PM ------      Message from: Lorayne Marek      Created: Wed Apr 10, 2014 10:29 AM       Blood work reviewed, patient has history of TIA/CVA,  LDL is still elevated patient is on Lipitor 40 mg, advise patient to increase Lipitor to 80 mg, will repeat lipid panel on the next visit. ------

## 2014-04-10 NOTE — Telephone Encounter (Signed)
Patient not available Left message with family member to have her return our call 

## 2014-05-06 ENCOUNTER — Encounter: Payer: Self-pay | Admitting: Neurology

## 2014-05-28 ENCOUNTER — Encounter (HOSPITAL_COMMUNITY): Payer: Self-pay | Admitting: Emergency Medicine

## 2014-05-28 ENCOUNTER — Emergency Department (INDEPENDENT_AMBULATORY_CARE_PROVIDER_SITE_OTHER)
Admission: EM | Admit: 2014-05-28 | Discharge: 2014-05-28 | Disposition: A | Discharge status: Home / Self Care | Payer: BC Managed Care – PPO | Source: Home / Self Care | Attending: Family Medicine | Admitting: Family Medicine

## 2014-05-28 DIAGNOSIS — S76311A Strain of muscle, fascia and tendon of the posterior muscle group at thigh level, right thigh, initial encounter: Secondary | ICD-10-CM

## 2014-05-28 DIAGNOSIS — M1710 Unilateral primary osteoarthritis, unspecified knee: Secondary | ICD-10-CM

## 2014-05-28 DIAGNOSIS — IMO0002 Reserved for concepts with insufficient information to code with codable children: Secondary | ICD-10-CM

## 2014-05-28 NOTE — Discharge Instructions (Signed)
Hamstring Syndrome with Rehab Hamstring syndrome is a rare condition that causes pain and sometimes loss of feeling in the back of the thigh, often to the bottom of the foot. Hamstring syndrome is caused by pressure on the sciatic nerve in the hip, by a fibrous tissue that extends between two of the hamstring muscles on the backside of the thigh. The sciatic nerve may also be compressed between the muscles and bones of the pelvis. The hamstring is a collection of three muscles located on the backside of the thigh, that are responsible for straightening the hip and bending the knee. The hamstring muscles are important for walking, running, and jumping. The sciatic nerve usually passes near these muscles, and the pelvis runs under these muscles, in the thigh.  SYMPTOMS   Tingling, numbness, or burning in the back of the thigh to the back of the knee, and sometimes to the bottom of the foot.  Tenderness in the buttock.  Pain and discomfort (burning or dull ache) in the hip or groin, mid-buttock area, the back of the thigh, and sometimes to the knee.  Heaviness or fatigue in the leg.  Pain that worsens when sitting, running fast, kicking, or trying to stretch the hamstring muscles.  Pain that is less strong when laying flat on the back. CAUSES  Hamstring syndrome is caused by pressure on the sciatic nerve in the hip, by either a fibrous tissue or bone. RISK INCREASES WITH:  Sports that require jumping, sprinting, hurdling, or sitting.  Kicking sports (like soccer and football kickers).  Recurring hamstring muscle strains.  Poor strength and flexibility. PREVENTION   Warm up and stretch properly before activity.  Maintain physical fitness:  Strength, flexibility, and endurance.  Cardiovascular fitness.  Learn and use proper exercise technique. PROGNOSIS  If treated properly, hamstring syndrome usually goes away in 2 to 6 weeks. Occasionally, hamstring syndrome goes away on its own.  Rarely, surgery is necessary. RELATED COMPLICATIONS   Permanent numbness in the affected knee, leg, and foot.  Persistent pain in the knee, leg, and foot.  Increasing weakness of the leg.  Disability and inability to compete in sports. TREATMENT  Treatment first involves resting from activities that aggravate your symptoms. The use of anti-inflammatory medications will help reduce pain and inflammation. Strengthening and stretching exercises are important for reducing the severity of symptoms. These exercises may be completed at home or with a therapist. Corticosteroid injections may be given to help reduce inflammation and reduce pain. If non-surgical treatment is unsuccessful, then surgery may be needed, to free the compressed nerve.  MEDICATION  If pain medicine is needed, nonsteroidal anti-inflammatory medicines (aspirin and ibuprofen), or other minor pain relievers (acetaminophen), are often advised.  Do not take pain medicine for 7 days before surgery.  Prescription pain relievers may be given if your caregiver thinks they are needed. Use only as directed and only as much as you need.  Corticosteroid injections may be recommended. However, these injections should only be used for serious cases, as they can only be given a certain number of times. HEAT AND COLD  Cold treatment (icing) relieves pain and reduces inflammation. Cold treatment should be applied for 10 to 15 minutes every 2 to 3 hours, and immediately after activity that aggravates your symptoms. Use ice packs or an ice massage.  Heat treatment may be used before performing the stretching and strengthening activities prescribed by your caregiver, physical therapist, or athletic trainer. Use a heat pack or a warm water  soak. SEEK MEDICAL CARE IF:  Symptoms get worse or do not improve in 2 weeks, despite treatment.  New, unexplained symptoms develop. (Drugs used in treatment may produce side effects.) EXERCISES RANGE OF  MOTION (ROM)AND STRETCHING EXERCISES - Hamstring Syndrome These exercises may help you when beginning to rehabilitate your injury. Nerves can be easily irritated by excessive or incorrect movements. Only increase your repetitions with your caregiver's permission.Contact your caregiver if your symptoms get worse while doing any of the prescribed exercises. Your symptoms may go away with or without further involvement from your physician, physical therapist or athletic trainer. While completing these exercises, remember:   Restoring tissue flexibility helps normal motion to return to the joints. This allows healthier, less painful movement and activity.  An effective stretch should be held for at least 30 seconds.  A stretch should never be painful. You should only feel a gentle lengthening or release in the stretched tissue. STRETCH - Hamstrings, Standing  Stand or sit, and extend your right / left leg, placing your foot on a chair or foot stool.  Keep a slight arch in your low back and your hips straight forward.  Lead with your chest and lean forward at the waist, until you feel a gentle stretch in the back of your right / left knee or thigh. (When done correctly, this exercise requires leaning only a small distance.)  Hold this position for __________ seconds. Repeat __________ times. Complete this stretch __________ times per day. STRETCH - Hamstrings, Supine   Lie on your back. Loop a belt or towel over the ball of your right / left foot.  Straighten your right / left knee, and slowly pull on the belt to raise your leg. Do not allow the right / left knee to bend. Keep your opposite leg flat on the floor.  Raise the leg until you feel a gentle stretch behind your right / left knee or thigh. Hold this position for __________ seconds. Repeat __________ times. Complete this stretch __________ times per day.  STRETCH - Hamstrings, Doorway  Lie on your back with your right / left leg  extended and resting on the wall, and the opposite leg flat on the ground through the door. Initially, position your bottom farther away from the wall.  Keep your right / left knee straight. If you feel a stretch behind your knee or thigh, hold this position for __________ seconds.  If you do not feel a stretch, scoot your bottom closer to the door and hold __________ seconds. Repeat __________ times. Complete this stretch __________ times per day.  STRETCH - Hamstrings/Adductors, V-Sit   Sit on the floor with your legs extended in a large "V," keeping your knees straight.  With your head and chest upright, bend at your waist reaching for your left foot to stretch your right thigh muscles.  You should feel a stretch in your right inner thigh. Hold for __________ seconds.  Return to the upright position to relax your leg muscles.  Continuing to keep your chest upright, bend straight forward at your waist to stretch your hamstrings.  You should feel a stretch behind both of your thighs and knees. Hold for __________ seconds.  Return to the upright position to relax your leg muscles.  With your head and chest upright, bend at your waist reaching for your right foot to stretch your left thigh muscles.  You should feel a stretch in your left inner thigh. Hold for __________ seconds.  Return to  the upright position to relax your leg muscles. Repeat __________ times. Complete this exercise __________ times per day.  MOBILIZATION EXERCISES - Hamstring Syndrome Mobilization exercises help trapped nerves to glide freely. When nerves have extra pressure on them, or when they get anchored down by surrounding tissues, they can cause pain, numbness or tingling. When completing a mobilization exercise, remember:  Nerves are very sensitive tissue. They must be mobilized very gently. Never force a motion and do not push through discomfort.  Mobilize nerves slowly.  Nerves can be very long. Be sure  to position all of your body parts exactly as described. MOBILIZATION - Nerve Root   Sit on a firm surface that is high enough for your right / left foot to swing freely. You may place a folded towel under your right / left thigh, if helpful.  Sit with a rounded or slouched back. Drop your head forward.  Keeping your right / left foot relaxed, slowly straighten your right / left knee, until it is fully extended or you feel a slight pull behind your knee or calf.  If you do not feel a slight pull, slowly draw your foot and toes toward you.  Hold for __________ seconds. Release the tension in your knee and ankle slowly. Repeat __________ times. Complete this exercise __________ times per day. Document Released: 10/25/2005 Document Revised: 01/17/2012 Document Reviewed: 02/06/2009 [facility] Patient Information 2015 [place], [place]. This information is not intended to replace advice given to you by your health care provider. Make sure you discuss any questions you have with your health care provider.   Wear and Tear Disorders of the Knee (Arthritis, Osteoarthritis) Everyone will experience wear and tear injuries (arthritis, osteoarthritis) of the knee. These are the changes we all get as we age. They come from the joint stress of daily living. The amount of cartilage damage in your knee and your symptoms determine if you need surgery. Mild problems require approximately two months recovery time. More severe problems take several months to recover. With mild problems, your surgeon may find worn and rough cartilage surfaces. With severe changes, your surgeon may find cartilage that has completely worn away and exposed the bone. Loose bodies of bone and cartilage, bone spurs (excess bone growth), and injuries to the menisci (cushions between the large bones of your leg) are also common. All of these problems can cause pain. For a mild wear and tear problem, rough cartilage may simply need to be shaved and  smoothed. For more severe problems with areas of exposed bone, your surgeon may use an instrument for roughing up the bone surfaces to stimulate new cartilage growth. Loose bodies are usually removed. Torn menisci may be trimmed or repaired. ABOUT THE ARTHROSCOPIC PROCEDURE Arthroscopy is a surgical technique. It allows your orthopedic surgeon to diagnose and treat your knee injury with accuracy. The surgeon looks into your knee through a small scope. The scope is like a small (pencil-sized) telescope. Arthroscopy is less invasive than open knee surgery. You can expect a more rapid recovery. After the procedure, you will be moved to a recovery area until most of the effects of the medication have worn off. Your caregiver will discuss the test results with you. RECOVERY The severity of the arthritis and the type of procedure performed will determine recovery time. Other important factors include age, physical condition, medical conditions, and the type of rehabilitation program. Strengthening your muscles after arthroscopy helps guarantee a better recovery. Follow your caregiver's instructions. Use crutches, rest,  elevate, ice, and do knee exercises as instructed. Your caregivers will help you and instruct you with exercises and other physical therapy required to regain your mobility, muscle strength, and functioning following surgery. Only take over-the-counter or prescription medicines for pain, discomfort, or fever as directed by your caregiver.  SEEK MEDICAL CARE IF:   There is increased bleeding (more than a small spot) from the wound.  You notice redness, swelling, or increasing pain in the wound.  Pus is coming from wound.  You develop an unexplained oral temperature above 102 F (38.9 C) , or as your caregiver suggests.  You notice a foul smell coming from the wound or dressing.  You have severe pain with motion of the knee. SEEK IMMEDIATE MEDICAL CARE IF:   You develop a rash.  You  have difficulty breathing.  You have any allergic problems. MAKE SURE YOU:   Understand these instructions.  Will watch your condition.  Will get help right away if you are not doing well or get worse. Document Released: 10/22/2000 Document Revised: 01/17/2012 Document Reviewed: 03/20/2008 [facility] Patient Information 2015 [place], [place]. This information is not intended to replace advice given to you by your health care provider. Make sure you discuss any questions you have with your health care provider.

## 2014-05-28 NOTE — ED Provider Notes (Signed)
Medical screening examination/treatment/procedure(s) were performed by resident physician or non-physician practitioner and as supervising physician I was immediately available for consultation/collaboration.   Pauline Good MD.   Billy Fischer, MD 05/28/14 [phone]

## 2014-05-28 NOTE — ED Notes (Signed)
Patient c/o posterior right knee pain x 1 week. Patient reports she does a lot of heavy lifting and pulling and work. Patient reports it hurts more when she stands up and bears weight. Patient is alert and oriented and in no acute distress.
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
## 2014-06-16 ENCOUNTER — Emergency Department (HOSPITAL_COMMUNITY): Payer: BC Managed Care – PPO

## 2014-06-16 ENCOUNTER — Encounter (HOSPITAL_COMMUNITY): Payer: Self-pay | Admitting: Emergency Medicine

## 2014-06-16 ENCOUNTER — Emergency Department (HOSPITAL_COMMUNITY)
Admission: EM | Admit: 2014-06-16 | Discharge: 2014-06-17 | Disposition: A | Discharge status: Home / Self Care | Payer: BC Managed Care – PPO | Attending: Emergency Medicine | Admitting: Emergency Medicine

## 2014-06-16 DIAGNOSIS — M25469 Effusion, unspecified knee: Secondary | ICD-10-CM | POA: Diagnosis not present

## 2014-06-16 DIAGNOSIS — Z862 Personal history of diseases of the blood and blood-forming organs and certain disorders involving the immune mechanism: Secondary | ICD-10-CM | POA: Insufficient documentation

## 2014-06-16 DIAGNOSIS — Z7901 Long term (current) use of anticoagulants: Secondary | ICD-10-CM | POA: Insufficient documentation

## 2014-06-16 DIAGNOSIS — Z79899 Other long term (current) drug therapy: Secondary | ICD-10-CM | POA: Insufficient documentation

## 2014-06-16 DIAGNOSIS — Z8673 Personal history of transient ischemic attack (TIA), and cerebral infarction without residual deficits: Secondary | ICD-10-CM | POA: Diagnosis not present

## 2014-06-16 DIAGNOSIS — F17200 Nicotine dependence, unspecified, uncomplicated: Secondary | ICD-10-CM | POA: Insufficient documentation

## 2014-06-16 DIAGNOSIS — M25569 Pain in unspecified knee: Secondary | ICD-10-CM | POA: Insufficient documentation

## 2014-06-16 DIAGNOSIS — M25561 Pain in right knee: Secondary | ICD-10-CM

## 2014-06-16 DIAGNOSIS — Z8701 Personal history of pneumonia (recurrent): Secondary | ICD-10-CM | POA: Insufficient documentation

## 2014-06-16 DIAGNOSIS — M2540 Effusion, unspecified joint: Secondary | ICD-10-CM

## 2014-06-16 NOTE — ED Notes (Signed)
Pt. reports persistent right knee pain for 2 weeks with swelling , denies injury or fall / ambulatory.

## 2014-06-16 NOTE — ED Provider Notes (Signed)
CSN: [id number]     Arrival date & time 06/16/14  2207 History  This chart was scribed for non-physician practitioner working with [name], MD by Mercy Moore, ED Scribe. This patient was seen in room TR07C/TR07C and the patient's care was started at 10:58 PM.   Chief Complaint  Patient presents with  . Knee Pain     The history is provided by the patient. No language interpreter was used.   HPI Comments: [name] is a 43 y.o. female who presents to the Emergency Department with chief complaint of worsening right knee pain and swelling ongoing for two weeks. Patient reports that the pain is waxing and waning in severity, but today her pain is much more intense. Patient's pain is exacerbated when first standing and bearing weight. Patient is ambulatory with pain.  Patient seen at Urgent care for right knee pain 05/28/14. She was diagnosed with arthritis and hamstring strain. Patient reports treatment with naproxen and ice, without improvement.  Patient denies direct injury or causative trauma. 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   Past Medical History  Diagnosis Date  . Stroke 05/2013  . History of TIAs   . Pneumonia   . Headache(784.0)   . Numbness     Right hand  . Anemia   . History of blood transfusion     Hx; of in 1991 after delivery   Past Surgical History  Procedure Laterality Date  . Cesarean section      x 1 with 5th pregnancy  . Cholecystectomy    . Tee without cardioversion N/A 05/21/2013    Procedure: TRANSESOPHAGEAL ECHOCARDIOGRAM (TEE);  Surgeon: Sueanne Margarita, MD;  Location: [facility] ENDOSCOPY;  Service: Cardiovascular;  Laterality: N/A;  . Tubal ligation    . Anterior cervical decomp/discectomy fusion N/A 03/06/2014    Procedure: ANTERIOR CERVICAL DECOMPRESSION/DISCECTOMY FUSION 2 LEVELS four/five, five/six;  Surgeon: Eustace Moore, MD;  Location: [facility] NEURO ORS;  Service: Neurosurgery;  Laterality: N/A;   Family History  Problem  Relation Age of Onset  . Renal Disease Father     dialysis  . Hypertension Father   . Heart disease Mother     stabbed in heart   History  Substance Use Topics  . Smoking status: Current Every Day Smoker -- 0.50 packs/day for 28 years    Types: Cigarettes  . Smokeless tobacco: Never Used     Comment: pt has script for Chantix  . Alcohol Use: No   OB History   Grav Para Term Preterm Abortions TAB SAB Ect Mult Living                 Review of Systems  Constitutional: Negative for fever and chills.  Musculoskeletal: Positive for arthralgias.  Neurological: Negative for weakness and numbness.      Allergies  Shellfish allergy  Home Medications   Prior to Admission medications   Medication Sig Start Date End Date Taking? Authorizing Provider  atorvastatin (LIPITOR) 80 MG tablet Take 1 tablet (80 mg total) by mouth daily at 6 PM. 04/10/14   Lorayne Marek, MD  bisacodyl (DULCOLAX) 5 MG EC tablet Take 5 mg by mouth daily as needed for moderate constipation.    Historical Provider, MD  clopidogrel (PLAVIX) 75 MG tablet Take 75 mg by mouth daily with breakfast.    Historical Provider, MD  diphenhydrAMINE (BENADRYL) 25 MG tablet Take 25 mg by mouth every 6 (six) hours as needed  for itching.    Historical Provider, MD  Docusate Calcium (STOOL SOFTENER PO) Take 1 tablet by mouth daily as needed (Constipation).    Historical Provider, MD  methocarbamol (ROBAXIN) 500 MG tablet Take 1 tablet (500 mg total) by mouth every 6 (six) hours as needed for muscle spasms. 03/07/14   Eustace Moore, MD  oxyCODONE-acetaminophen (PERCOCET) 10-325 MG per tablet Take 1 tablet by mouth every 4 (four) hours as needed for pain. 03/07/14   Eustace Moore, MD  traZODone (DESYREL) 50 MG tablet Take 0.5-1 tablets (25-50 mg total) by mouth at bedtime as needed for sleep. 04/09/14   Lorayne Marek, MD  varenicline (CHANTIX CONTINUING MONTH PAK) 1 MG tablet Take 1 tablet (1 mg total) by mouth 2 (two) times daily. 03/05/14    Dorothy Spark, MD  varenicline (CHANTIX STARTING MONTH PAK) 0.5 MG X 11 & 1 MG X 42 tablet Take one  tablet once daily for 3 days, then increase one tablet twice daily for 4 days, then increase to one 1 mg tablet twice daily. 03/05/14   Dorothy Spark, MD   Triage Vitals: BP 124/85  Pulse 91  Temp(Src) 98.5 F (36.9 C) (Oral)  Resp 14  Ht 5\' 3"  (1.6 m)  Wt 206 lb (93.441 kg)  BMI 36.50 kg/m2  SpO2 98%  LMP 06/02/2014  Physical Exam  Nursing note and vitals reviewed. Constitutional: She is oriented to person, place, and time. She appears well-developed and well-nourished. No distress.  HENT:  Head: Normocephalic and atraumatic.  Eyes: EOM are normal.  Neck: Neck supple.  Cardiovascular: Normal rate.   Pulmonary/Chest: Effort normal. No respiratory distress.  Musculoskeletal: Normal range of motion. She exhibits tenderness.  Right knee: moderately swollen and tender to palpation diffusely. ROM and strength limited secondary to pain. No bony abnormalities or deformity.   Neurological: She is alert and oriented to person, place, and time.  Skin: Skin is warm and dry.  No erythema, cellulitis or signs of septic joints.   Psychiatric: She has a normal mood and affect. Her behavior is normal.    ED Course  Procedures (including critical care time)  COORDINATION OF CARE: 11:17 PM- Will order X-ray of right knee. Discussed treatment plan with patient at bedside and patient agreed to plan.   Labs Review Labs Reviewed - No data to display  Imaging Review No results found.   EKG Interpretation None      MDM   Final diagnoses:  Knee pain, acute, right  Joint effusion    Patient with right knee pain, and knee effusion. Suspect knee sprain. Possible ligamentous injury. Will recommend orthopedic followup. Patient given a knee immobilizer, crutches, and pain medicine. Return precautions given. No evidence of DVT. No evidence of septic joint. Vital signs are stable.  Afebrile.  Filed Vitals:   06/16/14 2244  BP: 124/85  Pulse: 91  Temp: 98.5 F (36.9 C)  Resp: 14     I personally performed the services described in this documentation, which was scribed in my presence. The recorded information has been reviewed and is accurate.    Montine Circle, PA-C 06/17/14 [phone]

## 2014-06-16 NOTE — ED Notes (Signed)
Pt back from X-ray.  

## 2014-06-17 MED ORDER — OXYCODONE-ACETAMINOPHEN 5-325 MG PO TABS: 2.0000 | ORAL_TABLET | Freq: Four times a day (QID) | ORAL | Status: DC | PRN

## 2014-06-17 NOTE — Discharge Instructions (Signed)
Knee Effusion The medical term for having fluid in your knee is effusion. This is often due to an internal derangement of the knee. This means something is wrong inside the knee. Some of the causes of fluid in the knee may be torn cartilage, a torn ligament, or bleeding into the joint from an injury. Your knee is likely more difficult to bend and move. This is often because there is increased pain and pressure in the joint. The time it takes for recovery from a knee effusion depends on different factors, including:   Type of injury.  Your age.  Physical and medical conditions.  Rehabilitation Strategies. How long you will be away from your normal activities will depend on what kind of knee problem you have and how much damage is present. Your knee has two types of cartilage. Articular cartilage covers the bone ends and lets your knee bend and move smoothly. Two menisci, thick pads of cartilage that form a rim inside the joint, help absorb shock and stabilize your knee. Ligaments bind the bones together and support your knee joint. Muscles move the joint, help support your knee, and take stress off the joint itself. CAUSES  Often an effusion in the knee is caused by an injury to one of the menisci. This is often a tear in the cartilage. Recovery after a meniscus injury depends on how much meniscus is damaged and whether you have damaged other knee tissue. Small tears may heal on their own with conservative treatment. Conservative means rest, limited weight bearing activity and muscle strengthening exercises. Your recovery may take up to 6 weeks.  TREATMENT  Larger tears may require surgery. Meniscus injuries may be treated during arthroscopy. Arthroscopy is a procedure in which your surgeon uses a small telescope like instrument to look in your knee. Your caregiver can make a more accurate diagnosis (learning what is wrong) by performing an arthroscopic procedure. If your injury is on the inner margin  of the meniscus, your surgeon may trim the meniscus back to a smooth rim. In other cases your surgeon will try to repair a damaged meniscus with stitches (sutures). This may make rehabilitation take longer, but may provide better long term result by helping your knee keep its shock absorption capabilities. Ligaments which are completely torn usually require surgery for repair. HOME CARE INSTRUCTIONS  Use crutches as instructed.  If a brace is applied, use as directed.  Once you are home, an ice pack applied to your swollen knee may help with discomfort and help decrease swelling.  Keep your knee raised (elevated) when you are not up and around or on crutches.  Only take over-the-counter or prescription medicines for pain, discomfort, or fever as directed by your caregiver.  Your caregivers will help with instructions for rehabilitation of your knee. This often includes strengthening exercises.  You may resume a normal diet and activities as directed. SEEK MEDICAL CARE IF:   There is increased swelling in your knee.  You notice redness, swelling, or increasing pain in your knee.  An unexplained oral temperature above 102 F (38.9 C) develops. SEEK IMMEDIATE MEDICAL CARE IF:   You develop a rash.  You have difficulty breathing.  You have any allergic reactions from medications you may have been given.  There is severe pain with any motion of the knee. MAKE SURE YOU:   Understand these instructions.  Will watch your condition.  Will get help right away if you are not doing well or get worse.  Document Released: 01/15/2004 Document Revised: 01/17/2012 Document Reviewed: 03/20/2008 °ExitCare® Patient Information ©2015 [employer]. This information is not intended to replace advice given to you by your health care provider. Make sure you discuss any questions you have with your health care provider. ° °Knee Pain °The knee is the complex joint between your thigh and your lower leg.  It is made up of bones, tendons, ligaments, and cartilage. The bones that make up the knee are: °· The femur in the thigh. °· The tibia and fibula in the lower leg. °· The patella or kneecap riding in the groove on the lower femur. °CAUSES  °Knee pain is a common complaint with many causes. A few of these causes are: °· Injury, such as: °¨ A ruptured ligament or tendon injury. °¨ Torn cartilage. °· Medical conditions, such as: °¨ Gout °¨ Arthritis °¨ Infections °· Overuse, over training, or overdoing a physical activity. °Knee pain can be minor or severe. Knee pain can accompany debilitating injury. Minor knee problems often respond well to self-care measures or get well on their own. More serious injuries may need medical intervention or even surgery. °SYMPTOMS °The knee is complex. Symptoms of knee problems can vary widely. Some of the problems are: °· Pain with movement and weight bearing. °· Swelling and tenderness. °· Buckling of the knee. °· Inability to straighten or extend your knee. °· Your knee locks and you cannot straighten it. °· Warmth and redness with pain and fever. °· Deformity or dislocation of the kneecap. °DIAGNOSIS  °Determining what is wrong may be very straight forward such as when there is an injury. It can also be challenging because of the complexity of the knee. Tests to make a diagnosis may include: °· Your caregiver taking a history and doing a physical exam. °· Routine X-rays can be used to rule out other problems. X-rays will not reveal a cartilage tear. Some injuries of the knee can be diagnosed by: °¨ Arthroscopy a surgical technique by which a small video camera is inserted through tiny incisions on the sides of the knee. This procedure is used to examine and repair internal knee joint problems. Tiny instruments can be used during arthroscopy to repair the torn knee cartilage (meniscus). °¨ Arthrography is a radiology technique. A contrast liquid is directly injected into the knee  joint. Internal structures of the knee joint then become visible on X-ray film. °¨ An MRI scan is a non X-ray radiology procedure in which magnetic fields and a computer produce two- or three-dimensional images of the inside of the knee. Cartilage tears are often visible using an MRI scanner. MRI scans have largely replaced arthrography in diagnosing cartilage tears of the knee. °· Blood work. °· Examination of the fluid that helps to lubricate the knee joint (synovial fluid). This is done by taking a sample out using a needle and a syringe. °TREATMENT °The treatment of knee problems depends on the cause. Some of these treatments are: °· Depending on the injury, proper casting, splinting, surgery, or physical therapy care will be needed. °· Give yourself adequate recovery time. Do not overuse your joints. If you begin to get sore during workout routines, back off. Slow down or do fewer repetitions. °· For repetitive activities such as cycling or running, maintain your strength and nutrition. °· Alternate muscle groups. For example, if you are a weight lifter, work the upper body on one day and the lower body the next. °· Either tight or weak muscles do not   give the proper support for your knee. Tight or weak muscles do not absorb the stress placed on the knee joint. Keep the muscles surrounding the knee strong. °· Take care of mechanical problems. °¨ If you have flat feet, orthotics or special shoes may help. See your caregiver if you need help. °¨ Arch supports, sometimes with wedges on the inner or outer aspect of the heel, can help. These can shift pressure away from the side of the knee most bothered by osteoarthritis. °¨ A brace called an "unloader" brace also may be used to help ease the pressure on the most arthritic side of the knee. °· If your caregiver has prescribed crutches, braces, wraps or ice, use as directed. The acronym for this is PRICE. This means protection, rest, ice, compression, and  elevation. °· Nonsteroidal anti-inflammatory drugs (NSAIDs), can help relieve pain. But if taken immediately after an injury, they may actually increase swelling. Take NSAIDs with food in your stomach. Stop them if you develop stomach problems. Do not take these if you have a history of ulcers, stomach pain, or bleeding from the bowel. Do not take without your caregiver's approval if you have problems with fluid retention, heart failure, or kidney problems. °· For ongoing knee problems, physical therapy may be helpful. °· Glucosamine and chondroitin are over-the-counter dietary supplements. Both may help relieve the pain of osteoarthritis in the knee. These medicines are different from the usual anti-inflammatory drugs. Glucosamine may decrease the rate of cartilage destruction. °· Injections of a corticosteroid drug into your knee joint may help reduce the symptoms of an arthritis flare-up. They may provide pain relief that lasts a few months. You may have to wait a few months between injections. The injections do have a small increased risk of infection, water retention, and elevated blood sugar levels. °· Hyaluronic acid injected into damaged joints may ease pain and provide lubrication. These injections may work by reducing inflammation. A series of shots may give relief for as long as 6 months. °· Topical painkillers. Applying certain ointments to your skin may help relieve the pain and stiffness of osteoarthritis. Ask your pharmacist for suggestions. Many over the-counter products are approved for temporary relief of arthritis pain. °· In some countries, doctors often prescribe topical NSAIDs for relief of chronic conditions such as arthritis and tendinitis. A review of treatment with NSAID creams found that they worked as well as oral medications but without the serious side effects. °PREVENTION °· Maintain a healthy weight. Extra pounds put more strain on your joints. °· Get strong, stay limber. Weak muscles  are a common cause of knee injuries. Stretching is important. Include flexibility exercises in your workouts. °· Be smart about exercise. If you have osteoarthritis, chronic knee pain or recurring injuries, you may need to change the way you exercise. This does not mean you have to stop being active. If your knees ache after jogging or playing basketball, consider switching to swimming, water aerobics, or other low-impact activities, at least for a few days a week. Sometimes limiting high-impact activities will provide relief. °· Make sure your shoes fit well. Choose footwear that is right for your sport. °· Protect your knees. Use the proper gear for knee-sensitive activities. Use kneepads when playing volleyball or laying carpet. Buckle your seat belt every time you drive. Most shattered kneecaps occur in car accidents. °· Rest when you are tired. °SEEK MEDICAL CARE IF:  °You have knee pain that is continual and does not seem to be   getting better.  °SEEK IMMEDIATE MEDICAL CARE IF:  °Your knee joint feels hot to the touch and you have a high fever. °MAKE SURE YOU:  °· Understand these instructions. °· Will watch your condition. °· Will get help right away if you are not doing well or get worse. °Document Released: 08/22/2007 Document Revised: 01/17/2012 Document Reviewed: 08/22/2007 °ExitCare® Patient Information ©2015 [employer]. This information is not intended to replace advice given to you by your health care provider. Make sure you discuss any questions you have with your health care provider. ° °

## 2014-06-17 NOTE — ED Provider Notes (Signed)
Medical screening examination/treatment/procedure(s) were performed by non-physician practitioner and as supervising physician I was immediately available for consultation/collaboration.   EKG Interpretation None        Julianne Rice, MD 06/17/14 [phone]

## 2014-07-10 ENCOUNTER — Ambulatory Visit: Payer: BC Managed Care – PPO | Admitting: Internal Medicine

## 2014-07-10 ENCOUNTER — Telehealth: Payer: Self-pay | Admitting: Internal Medicine

## 2014-07-10 NOTE — Telephone Encounter (Signed)
Pt states she is being seen at [facility]. Pt states the dctr (whose name she was unsure of) wanted her to share her lab results with provider. Pt states she had a hemoglobin of 9.4. Pls contact pt.

## 2014-07-25 ENCOUNTER — Ambulatory Visit: Payer: Self-pay | Admitting: Neurology

## 2014-07-25 ENCOUNTER — Ambulatory Visit: Payer: Self-pay | Admitting: Nurse Practitioner

## 2014-07-27 IMAGING — CT CT HEAD W/O CM
1 series · 16 of 30 positions shown, 20 images · non-contrast
Comparison: MRI 05/18/2013

CLINICAL DATA: CT head left arm and face numbness and weakness.
Rule out CVA

CT HEAD WITHOUT CONTRAST

[Series 2: head 5.0 h30s · axial · 0.47mm/px · z∈[-165,-30]mm · 16 of 30 slices shown, 20 images]
[im 2/30  brain]
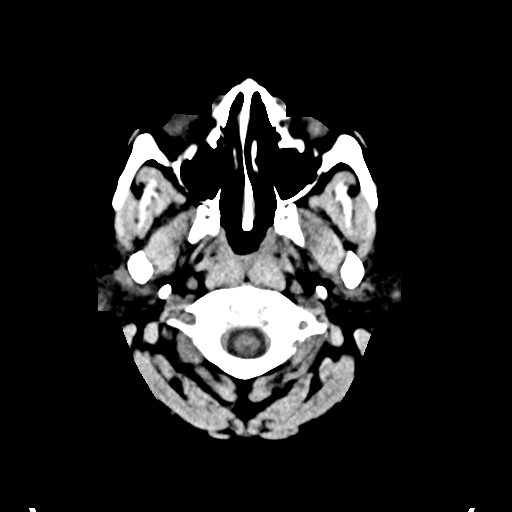
[im 2/30  bone]
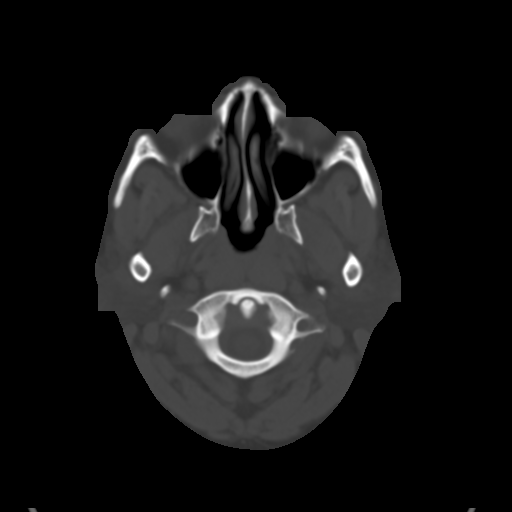
[im 4/30  brain]
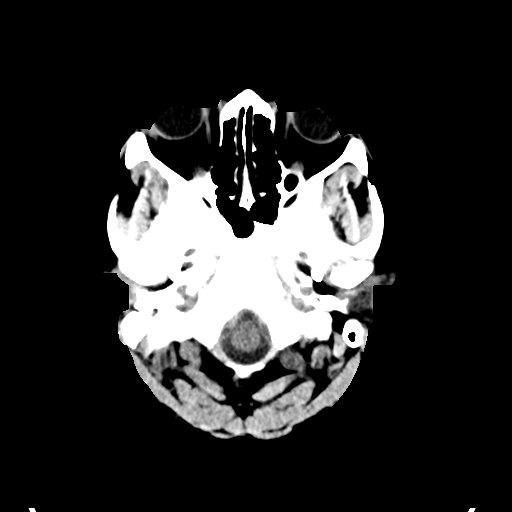
[im 6/30  brain]
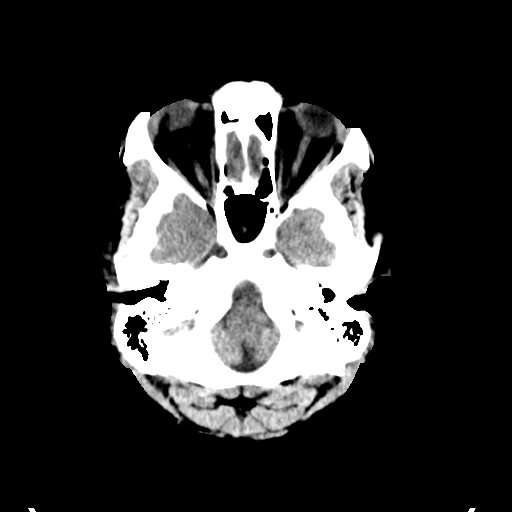
[im 8/30  brain]
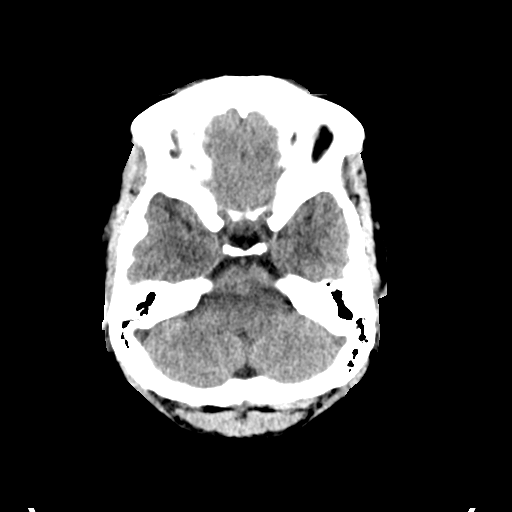
[im 9/30  brain]
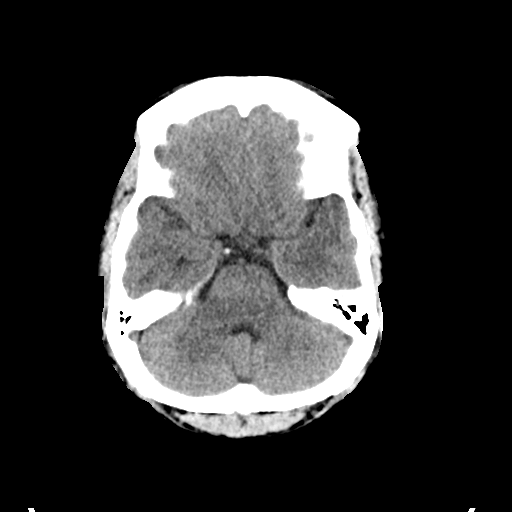
[im 9/30  bone]
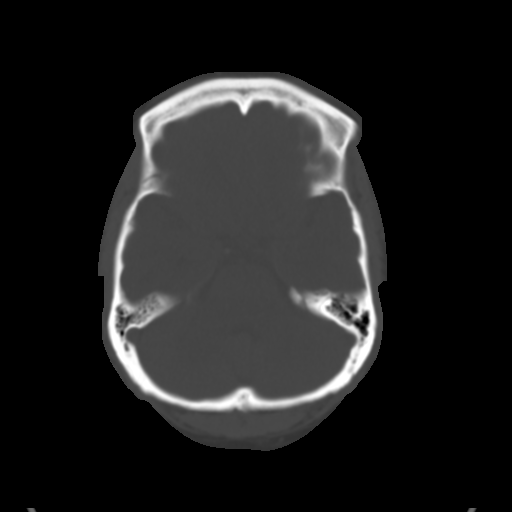
[im 11/30  brain]
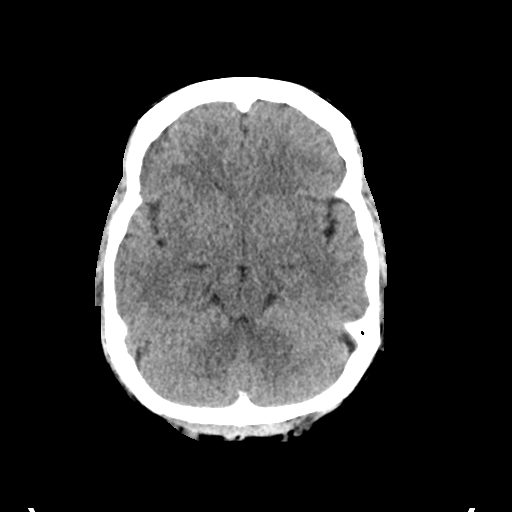
[im 13/30  brain]
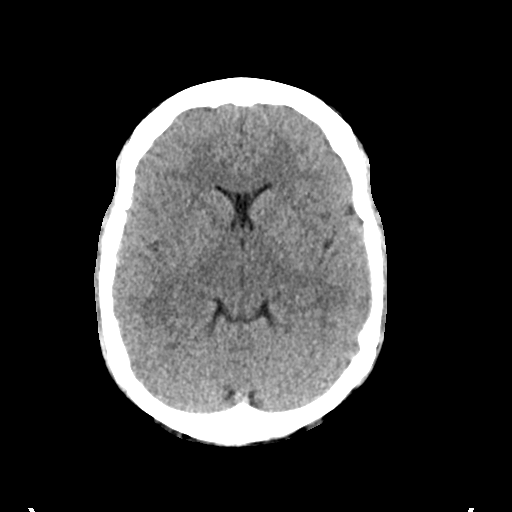
[im 15/30  brain]
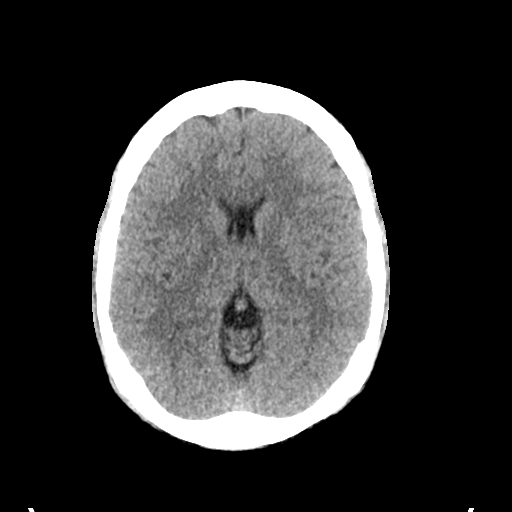
[im 16/30  brain]
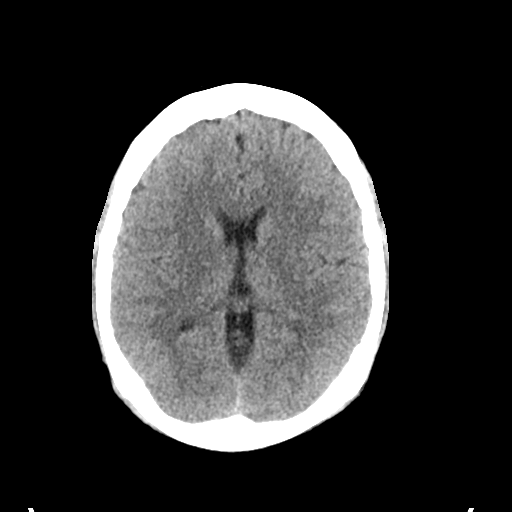
[im 16/30  bone]
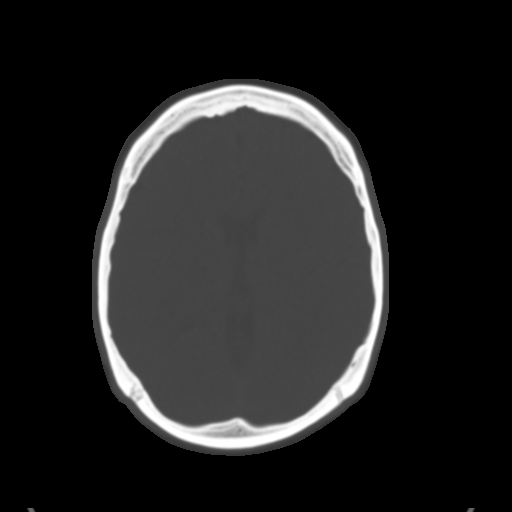
[im 18/30  brain]
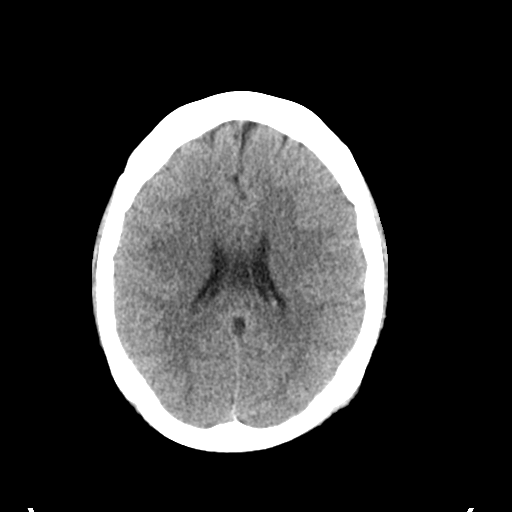
[im 20/30  brain]
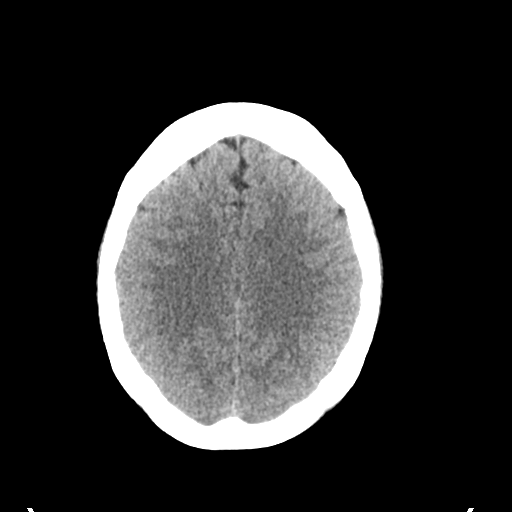
[im 22/30  brain]
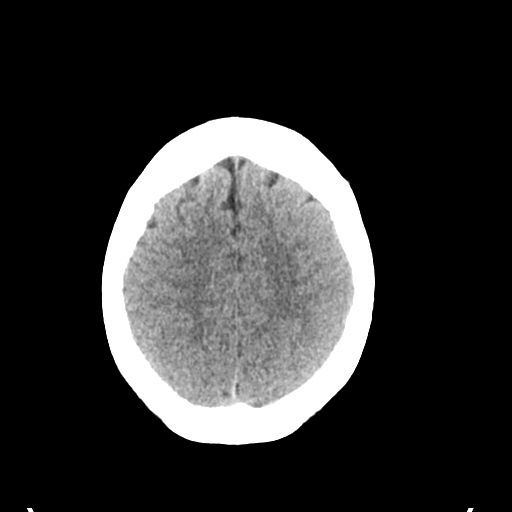
[im 23/30  brain]
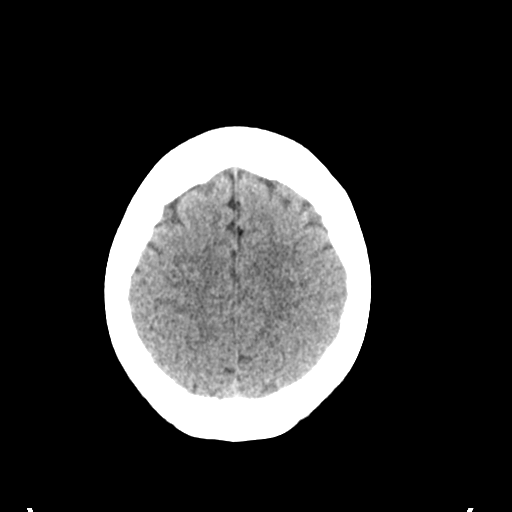
[im 23/30  bone]
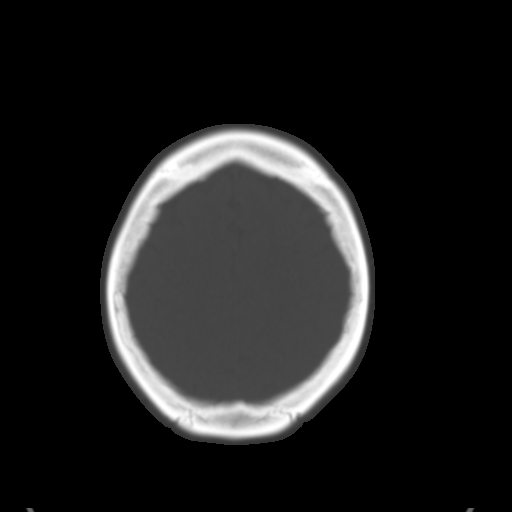
[im 25/30  brain]
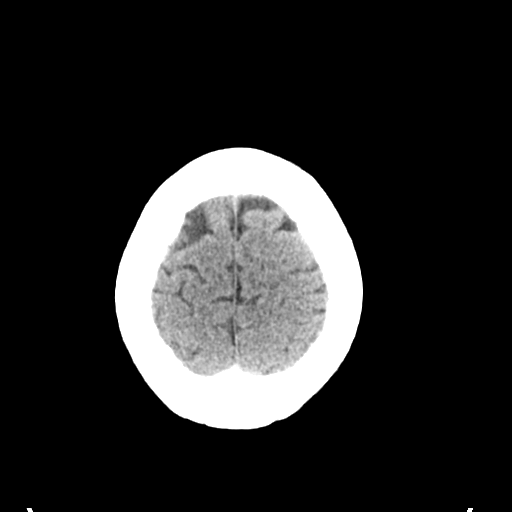
[im 27/30  brain]
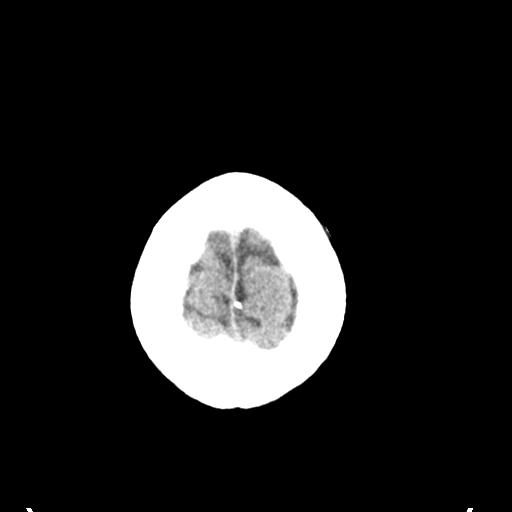
[im 29/30  brain]
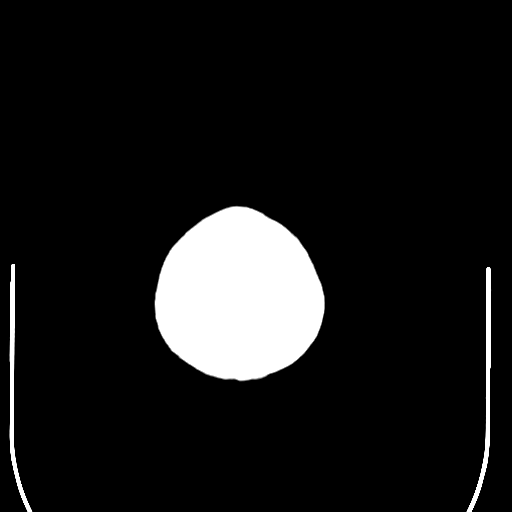

[16 of 30 positions shown; findings below may reference images not displayed]

FINDINGS: There is no evidence of fracture or dislocation.  There
is no evidence of arthropathy or other focal bone abnormality.
Soft tissues are unremarkable. Small area of acute infarct in the
right frontal parietal lobe on the recent MRI is not seen on the
current CT.
IMPRESSION: Negative.

## 2014-07-29 ENCOUNTER — Telehealth: Payer: Self-pay | Admitting: Nurse Practitioner

## 2014-07-29 NOTE — Telephone Encounter (Signed)
Patient was no show for her 07/25/14 office appointment.

## 2014-07-30 DIAGNOSIS — R519 Headache, unspecified: Secondary | ICD-10-CM | POA: Insufficient documentation

## 2014-09-26 ENCOUNTER — Emergency Department (HOSPITAL_COMMUNITY)
Admission: EM | Admit: 2014-09-26 | Discharge: 2014-09-27 | Disposition: A | Discharge status: Home / Self Care | Payer: BC Managed Care – PPO | Attending: Emergency Medicine | Admitting: Emergency Medicine

## 2014-09-26 ENCOUNTER — Encounter (HOSPITAL_COMMUNITY): Payer: Self-pay | Admitting: *Deleted

## 2014-09-26 ENCOUNTER — Emergency Department (HOSPITAL_COMMUNITY): Payer: BC Managed Care – PPO

## 2014-09-26 DIAGNOSIS — Z720 Tobacco use: Secondary | ICD-10-CM | POA: Insufficient documentation

## 2014-09-26 DIAGNOSIS — J40 Bronchitis, not specified as acute or chronic: Secondary | ICD-10-CM | POA: Insufficient documentation

## 2014-09-26 DIAGNOSIS — R0602 Shortness of breath: Secondary | ICD-10-CM | POA: Diagnosis present

## 2014-09-26 DIAGNOSIS — Z8701 Personal history of pneumonia (recurrent): Secondary | ICD-10-CM | POA: Diagnosis not present

## 2014-09-26 DIAGNOSIS — Z791 Long term (current) use of non-steroidal anti-inflammatories (NSAID): Secondary | ICD-10-CM | POA: Diagnosis not present

## 2014-09-26 DIAGNOSIS — Z91040 Latex allergy status: Secondary | ICD-10-CM | POA: Insufficient documentation

## 2014-09-26 DIAGNOSIS — Z8673 Personal history of transient ischemic attack (TIA), and cerebral infarction without residual deficits: Secondary | ICD-10-CM | POA: Diagnosis not present

## 2014-09-26 DIAGNOSIS — Z862 Personal history of diseases of the blood and blood-forming organs and certain disorders involving the immune mechanism: Secondary | ICD-10-CM | POA: Insufficient documentation

## 2014-09-26 DIAGNOSIS — Z79899 Other long term (current) drug therapy: Secondary | ICD-10-CM | POA: Insufficient documentation

## 2014-09-26 LAB — CBC
HCT: 31.3 % — ABNORMAL LOW (ref 36.0–46.0)
Hemoglobin: 9.5 g/dL — ABNORMAL LOW (ref 12.0–15.0)
MCH: 20.6 pg — ABNORMAL LOW (ref 26.0–34.0)
MCHC: 30.4 g/dL (ref 30.0–36.0)
MCV: 67.7 fL — ABNORMAL LOW (ref 78.0–100.0)
PLATELETS: 333 10*3/uL (ref 150–400)
RBC: 4.62 MIL/uL (ref 3.87–5.11)
RDW: 20.5 % — AB (ref 11.5–15.5)
WBC: 8.1 10*3/uL (ref 4.0–10.5)

## 2014-09-26 LAB — BASIC METABOLIC PANEL
ANION GAP: 13 (ref 5–15)
BUN: 10 mg/dL (ref 6–23)
CALCIUM: 8.7 mg/dL (ref 8.4–10.5)
CO2: 26 mEq/L (ref 19–32)
Chloride: 104 mEq/L (ref 96–112)
Creatinine, Ser: 0.83 mg/dL (ref 0.50–1.10)
GFR, EST NON AFRICAN AMERICAN: 84 mL/min — AB (ref >90)
Glucose, Bld: 151 mg/dL — ABNORMAL HIGH (ref 70–99)
Potassium: 4.3 mEq/L (ref 3.7–5.3)
SODIUM: 143 meq/L (ref 137–147)

## 2014-09-26 MED ORDER — IPRATROPIUM BROMIDE 0.02 % IN SOLN
1.0000 mg | Freq: Once | RESPIRATORY_TRACT | Status: AC
  Administered 2014-09-26: 1 mg via RESPIRATORY_TRACT
  Filled 2014-09-26: qty 5

## 2014-09-26 MED ORDER — PREDNISONE 20 MG PO TABS: ORAL_TABLET | ORAL | Status: DC

## 2014-09-26 MED ORDER — ALBUTEROL SULFATE (2.5 MG/3ML) 0.083% IN NEBU
5.0000 mg | INHALATION_SOLUTION | Freq: Once | RESPIRATORY_TRACT | Status: AC
  Administered 2014-09-26: 5 mg via RESPIRATORY_TRACT
  Filled 2014-09-26: qty 6

## 2014-09-26 MED ORDER — PREDNISONE 20 MG PO TABS
60.0000 mg | ORAL_TABLET | Freq: Once | ORAL | Status: AC
  Administered 2014-09-26: 60 mg via ORAL
  Filled 2014-09-26: qty 3

## 2014-09-26 MED ORDER — ALBUTEROL SULFATE HFA 108 (90 BASE) MCG/ACT IN AERS
2.0000 | INHALATION_SPRAY | Freq: Once | RESPIRATORY_TRACT | Status: DC
  Filled 2014-09-26: qty 6.7

## 2014-09-26 MED ORDER — ALBUTEROL (5 MG/ML) CONTINUOUS INHALATION SOLN
15.0000 mg/h | INHALATION_SOLUTION | Freq: Once | RESPIRATORY_TRACT | Status: AC
  Administered 2014-09-26: 15 mg/h via RESPIRATORY_TRACT
  Filled 2014-09-26: qty 20

## 2014-09-26 MED ORDER — IPRATROPIUM-ALBUTEROL 0.5-2.5 (3) MG/3ML IN SOLN
3.0000 mL | Freq: Once | RESPIRATORY_TRACT | Status: AC
  Administered 2014-09-26: 3 mL via RESPIRATORY_TRACT
  Filled 2014-09-26: qty 3

## 2014-09-26 NOTE — ED Notes (Signed)
Called RT regarding order for an hour long nebulizer

## 2014-09-26 NOTE — ED Notes (Signed)
MD at bedside. 

## 2014-09-26 NOTE — ED Notes (Signed)
Pt with productive cough x 2 weeks.  Now feels chest tightness and sob.  Nebx tx not working per pt.

## 2014-09-26 NOTE — Discharge Instructions (Signed)
Take prednisone as prescribed.   Use albuterol every 4 hrs as needed.   Stay hydrated.   Stop smoking,   Follow up with your doctor.   Return to ER if you have trouble breathing, shortness of breath.

## 2014-09-26 NOTE — ED Provider Notes (Signed)
CSN: [id number]     Arrival date & time 09/26/14  1506 History   First MD Initiated Contact with Patient 09/26/14 1858     Chief Complaint  Patient presents with  . Shortness of Breath     (Consider location/radiation/quality/duration/timing/severity/associated sxs/prior Treatment) The history is provided by the patient.  [name] is a 43 y.o. female hx of stroke, TIAs, bronchitis here with chest tightness and shortness of breath. Per Dr. cough for the last 2 weeks. She had some yellow sputum initially and now is white. Has some chest tightness associated with it. Tried some over-the-counter cough medicine with no relief. Denies any fever. She still smokes but has been too short of breath to smoke for the last several days. Had history of bronchitis a year ago. Never was diagnosed with COPD.    Past Medical History  Diagnosis Date  . Stroke 05/2013  . History of TIAs   . Pneumonia   . Headache(784.0)   . Numbness     Right hand  . Anemia   . History of blood transfusion     Hx; of in 1991 after delivery   Past Surgical History  Procedure Laterality Date  . Cesarean section      x 1 with 5th pregnancy  . Cholecystectomy    . Tee without cardioversion N/A 05/21/2013    Procedure: TRANSESOPHAGEAL ECHOCARDIOGRAM (TEE);  Surgeon: Sueanne Margarita, MD;  Location: [facility] ENDOSCOPY;  Service: Cardiovascular;  Laterality: N/A;  . Tubal ligation    . Anterior cervical decomp/discectomy fusion N/A 03/06/2014    Procedure: ANTERIOR CERVICAL DECOMPRESSION/DISCECTOMY FUSION 2 LEVELS four/five, five/six;  Surgeon: Eustace Moore, MD;  Location: [facility] NEURO ORS;  Service: Neurosurgery;  Laterality: N/A;   Family History  Problem Relation Age of Onset  . Renal Disease Father     dialysis  . Hypertension Father   . Heart disease Mother     stabbed in heart   History  Substance Use Topics  . Smoking status: Current Every Day Smoker -- 0.50 packs/day for 28 years    Types: Cigarettes  .  Smokeless tobacco: Never Used     Comment: pt has script for Chantix  . Alcohol Use: No   OB History    No data available     Review of Systems  Respiratory: Positive for shortness of breath.   All other systems reviewed and are negative.     Allergies  Shellfish allergy and Latex  Home Medications   Prior to Admission medications   Medication Sig Start Date End Date Taking? Authorizing Provider  meloxicam (MOBIC) 15 MG tablet Take 15 mg by mouth daily. 08/22/14  Yes Historical Provider, MD  oxyCODONE-acetaminophen (PERCOCET/ROXICET) 5-325 MG per tablet Take 2 tablets by mouth every 6 (six) hours as needed for severe pain. 06/17/14  Yes Montine Circle, PA-C  ALPRAZolam Duanne Moron) 0.5 MG tablet Take 0.5 mg by mouth daily. 08/22/14   Historical Provider, MD  oxyCODONE-acetaminophen (PERCOCET) 10-325 MG per tablet Take 1 tablet by mouth every 4 (four) hours as needed for pain. Patient not taking: Reported on 09/26/2014 03/07/14   Eustace Moore, MD   BP 122/85 mmHg  Pulse 112  Temp(Src) 98.1 F (36.7 C) (Oral)  Resp 29  Ht 5\' 4"  (1.626 m)  Wt 201 lb (91.173 kg)  BMI 34.48 kg/m2  SpO2 100%  LMP 09/17/2014 Physical Exam  Constitutional: She is oriented to person, place, and time. She appears well-developed and well-nourished.  HENT:  Head: Normocephalic.  Mouth/Throat: Oropharynx is clear and moist.  Eyes: Conjunctivae and EOM are normal. Pupils are equal, round, and reactive to light.  Neck: Normal range of motion. Neck supple.  Cardiovascular: Normal rate, regular rhythm and normal heart sounds.   Pulmonary/Chest:  Diminished breath sounds bilaterally. No wheezing   Abdominal: Soft. Bowel sounds are normal. She exhibits no distension. There is no tenderness. There is no rebound and no guarding.  Musculoskeletal: Normal range of motion. She exhibits no edema or tenderness.  Neurological: She is alert and oriented to person, place, and time. No cranial nerve deficit.  Coordination normal.  Skin: Skin is warm and dry.  Psychiatric: She has a normal mood and affect. Her behavior is normal. Judgment and thought content normal.  Nursing note and vitals reviewed.   ED Course  Procedures (including critical care time) Labs Review Labs Reviewed  BASIC METABOLIC PANEL - Abnormal; Notable for the following:    Glucose, Bld 151 (*)    GFR calc non Af Amer 84 (*)    All other components within normal limits  CBC - Abnormal; Notable for the following:    Hemoglobin 9.5 (*)    HCT 31.3 (*)    MCV 67.7 (*)    MCH 20.6 (*)    RDW 20.5 (*)    All other components within normal limits    Imaging Review Dg Chest 2 View  09/26/2014   CLINICAL DATA:  Shortness of breath.  Cough.  EXAM: CHEST  2 VIEW  COMPARISON:  03/05/2014  FINDINGS: Heart size and vascularity are normal. There is peribronchial thickening with increased interstitial accentuation bilaterally. No consolidative infiltrates or effusions. No acute osseous abnormality. Interval anterior cervical fusion.  IMPRESSION: Progressive bronchitic changes.   Electronically Signed   By: Rozetta Nunnery M.D.   On: 09/26/2014 17:23     EKG Interpretation   Date/Time:  Thursday September 26 2014 15:31:12 EST Ventricular Rate:  83 PR Interval:  134 QRS Duration: 74 QT Interval:  406 QTC Calculation: 477 R Axis:   50 Text Interpretation:  Normal sinus rhythm Nonspecific ST abnormality  Abnormal ECG No significant change since last tracing Confirmed by Leith Szafranski   MD, Lusine Corlett ([id number]) on 09/26/2014 11:18:46 PM      MDM   Final diagnoses:  SOB (shortness of breath)    [name] is a 43 y.o. female here with cough, SOB. Likely bronchitis. She is a smoker so I think likely has underlying COPD. Not hypoxic. CXR showed no pneumonia. Will give nebs, steroids and reassess.   11:36 PM After 1 Neb and steroids, Pulse ox dropped to 93-94%. More wheezing. Given continuous neb and pulse ox maintained > 95%. Patient  ambulated with pulse Ox > 95%. Tachycardia likely from nebs. Stable for d/c.     Wandra Arthurs, MD 09/26/14 [phone]

## 2014-09-26 NOTE — ED Notes (Signed)
Pt monitored by 5-lead. 

## 2014-10-17 ENCOUNTER — Ambulatory Visit: Payer: BC Managed Care – PPO | Attending: Internal Medicine | Admitting: Internal Medicine

## 2014-10-17 ENCOUNTER — Telehealth: Payer: Self-pay | Admitting: *Deleted

## 2014-10-17 VITALS — BP 102/72 | HR 68 | Temp 98.2°F | Resp 20

## 2014-10-17 DIAGNOSIS — R05 Cough: Secondary | ICD-10-CM | POA: Insufficient documentation

## 2014-10-17 DIAGNOSIS — Z87891 Personal history of nicotine dependence: Secondary | ICD-10-CM | POA: Insufficient documentation

## 2014-10-17 DIAGNOSIS — R059 Cough, unspecified: Secondary | ICD-10-CM

## 2014-10-17 MED ORDER — ALBUTEROL SULFATE HFA 108 (90 BASE) MCG/ACT IN AERS: 2.0000 | INHALATION_SPRAY | Freq: Four times a day (QID) | RESPIRATORY_TRACT | Status: DC | PRN

## 2014-10-17 MED ORDER — PREDNISONE 20 MG PO TABS: ORAL_TABLET | ORAL | Status: DC

## 2014-10-17 NOTE — Progress Notes (Signed)
Patient walked in c/o cough productive of white sputum, nasal congestion, and clear nasal drainage Patient denies fever, chills, aches, sore throat, ear pain,   Patient states she is a former smoker ( 1 ppd X 25 years; quit 3weeks ago)  BP 102/72 P 68 T  98.2 oral R 18 SPO2 98%  No tenderness noted upon palpation of maxillary or frontal sinuses No cervical lymphadenopathy noted Ears: canals clear without redness or drainage; tympanic membrane intact without redness or bulging Throat: clear without redness or drainage Lungs: diminished breath sounds noted throughout with wheezing noted on expiration in upper lobes  Per Provider: Albuterol inhaler 2 p q 6h prn wheezing/cough Prednisone 20 mg daily for 5 days Make first available appt with PCP  Patient encouraged to rest, drink plenty of fluids (water)   Agree with RN's assessment and intervention  Lorayne Marek, MD

## 2014-10-17 NOTE — Telephone Encounter (Signed)
Patient walked in asking for refill on albuterol MDI Patient was seen in ED last month for bronchitis and was given sample of albuterol MDI Patient is overdue for f/u with PCP  Per PCP:  albuterol refill given this time Must schedule f/u with PCP  Rx e-scribed to [facility]

## 2014-10-28 ENCOUNTER — Encounter: Payer: Self-pay | Admitting: Internal Medicine

## 2014-10-28 ENCOUNTER — Ambulatory Visit: Payer: BC Managed Care – PPO | Attending: Internal Medicine | Admitting: Internal Medicine

## 2014-10-28 VITALS — BP 134/76 | HR 97 | Temp 98.0°F | Resp 16 | Wt 206.8 lb

## 2014-10-28 DIAGNOSIS — Z8673 Personal history of transient ischemic attack (TIA), and cerebral infarction without residual deficits: Secondary | ICD-10-CM | POA: Diagnosis not present

## 2014-10-28 DIAGNOSIS — Z87891 Personal history of nicotine dependence: Secondary | ICD-10-CM | POA: Insufficient documentation

## 2014-10-28 DIAGNOSIS — J069 Acute upper respiratory infection, unspecified: Secondary | ICD-10-CM | POA: Diagnosis not present

## 2014-10-28 DIAGNOSIS — Z79899 Other long term (current) drug therapy: Secondary | ICD-10-CM | POA: Insufficient documentation

## 2014-10-28 DIAGNOSIS — R05 Cough: Secondary | ICD-10-CM

## 2014-10-28 DIAGNOSIS — R0981 Nasal congestion: Secondary | ICD-10-CM

## 2014-10-28 DIAGNOSIS — K219 Gastro-esophageal reflux disease without esophagitis: Secondary | ICD-10-CM | POA: Diagnosis not present

## 2014-10-28 DIAGNOSIS — R059 Cough, unspecified: Secondary | ICD-10-CM

## 2014-10-28 MED ORDER — FLUTICASONE PROPIONATE 50 MCG/ACT NA SUSP: 2.0000 | Freq: Every day | NASAL | Status: DC

## 2014-10-28 MED ORDER — AZITHROMYCIN 250 MG PO TABS: ORAL_TABLET | ORAL | Status: DC

## 2014-10-28 MED ORDER — OMEPRAZOLE 20 MG PO CPDR: 20.0000 mg | DELAYED_RELEASE_CAPSULE | Freq: Every day | ORAL | Status: DC

## 2014-10-28 MED ORDER — BENZONATATE 100 MG PO CAPS: 100.0000 mg | ORAL_CAPSULE | Freq: Three times a day (TID) | ORAL | Status: DC | PRN

## 2014-10-28 NOTE — Progress Notes (Signed)
Patient here for follow up Patient was seen last week by the triage nurse and treated for bronchitis Patient states she is still coughing and states she finished the medications but still Having symptoms

## 2014-10-28 NOTE — Progress Notes (Signed)
MRN: [id number] Name: [name]  Sex: female Age: 43 y.o. DOB: 06-16-1971  Allergies: Shellfish allergy and Latex  Chief Complaint  Patient presents with  . Follow-up    HPI: Patient is 43 y.o. female who has been having ongoing URI symptoms for the last one month, she had a chest x-ray done which reported bronchitis, patient has who recently have been treated with prednisone and has been using albuterol when necessary, currently patient denies smoking cigarettes, has more of a cough which is whitish in color especially when she lays down her cough is worse and does feel mucus running down the back of her throat, with that she also has symptoms of reflux, denies any fever chills chest pain or shortness of breath,  Past Medical History  Diagnosis Date  . Stroke 05/2013  . History of TIAs   . Pneumonia   . Headache(784.0)   . Numbness     Right hand  . Anemia   . History of blood transfusion     Hx; of in 1991 after delivery    Past Surgical History  Procedure Laterality Date  . Cesarean section      x 1 with 5th pregnancy  . Cholecystectomy    . Tee without cardioversion N/A 05/21/2013    Procedure: TRANSESOPHAGEAL ECHOCARDIOGRAM (TEE);  Surgeon: Sueanne Margarita, MD;  Location: [facility] ENDOSCOPY;  Service: Cardiovascular;  Laterality: N/A;  . Tubal ligation    . Anterior cervical decomp/discectomy fusion N/A 03/06/2014    Procedure: ANTERIOR CERVICAL DECOMPRESSION/DISCECTOMY FUSION 2 LEVELS four/five, five/six;  Surgeon: Eustace Moore, MD;  Location: [facility] NEURO ORS;  Service: Neurosurgery;  Laterality: N/A;      Medication List       This list is accurate as of: 10/28/14  3:19 PM.  Always use your most recent med list.               albuterol 108 (90 BASE) MCG/ACT inhaler  Commonly known as:  PROVENTIL HFA;VENTOLIN HFA  Inhale 2 puffs into the lungs every 6 (six) hours as needed for wheezing or shortness of breath.     ALPRAZolam 0.5 MG tablet  Commonly known as:   XANAX  Take 0.5 mg by mouth daily.     azithromycin 250 MG tablet  Commonly known as:  ZITHROMAX Z-PAK  Take as directed     benzonatate 100 MG capsule  Commonly known as:  TESSALON  Take 1 capsule (100 mg total) by mouth 3 (three) times daily as needed for cough.     fluticasone 50 MCG/ACT nasal spray  Commonly known as:  FLONASE  Place 2 sprays into both nostrils daily.     meloxicam 15 MG tablet  Commonly known as:  MOBIC  Take 15 mg by mouth daily.     omeprazole 20 MG capsule  Commonly known as:  PRILOSEC  Take 1 capsule (20 mg total) by mouth daily.     oxyCODONE-acetaminophen 10-325 MG per tablet  Commonly known as:  PERCOCET  Take 1 tablet by mouth every 4 (four) hours as needed for pain.     oxyCODONE-acetaminophen 5-325 MG per tablet  Commonly known as:  PERCOCET/ROXICET  Take 2 tablets by mouth every 6 (six) hours as needed for severe pain.     predniSONE 20 MG tablet  Commonly known as:  DELTASONE  Take 1 tab daily with breakfast for 5 days        Meds ordered this encounter  Medications  . fluticasone (FLONASE) 50 MCG/ACT nasal spray    Sig: Place 2 sprays into both nostrils daily.    Dispense:  16 g    Refill:  2  . benzonatate (TESSALON) 100 MG capsule    Sig: Take 1 capsule (100 mg total) by mouth 3 (three) times daily as needed for cough.    Dispense:  30 capsule    Refill:  1  . omeprazole (PRILOSEC) 20 MG capsule    Sig: Take 1 capsule (20 mg total) by mouth daily.    Dispense:  30 capsule    Refill:  3  . azithromycin (ZITHROMAX Z-PAK) 250 MG tablet    Sig: Take as directed    Dispense:  6 each    Refill:  0    Immunization History  Administered Date(s) Administered  . Influenza-Unspecified 08/08/2014  . Pneumococcal Polysaccharide-23 05/21/2013    Family History  Problem Relation Age of Onset  . Renal Disease Father     dialysis  . Hypertension Father   . Heart disease Mother     stabbed in heart    History  Substance Use  Topics  . Smoking status: Former Smoker -- 0.50 packs/day for 28 years    Types: Cigarettes    Quit date: 09/26/2014  . Smokeless tobacco: Never Used     Comment: pt has script for Chantix  . Alcohol Use: No    Review of Systems   As noted in HPI  Filed Vitals:   10/28/14 1429  BP: 134/76  Pulse: 97  Temp: 98 F (36.7 C)  Resp: 16    Physical Exam  Physical Exam  HENT:  Nasal congestion no sinus tenderness   Eyes: EOM are normal. Pupils are equal, round, and reactive to light.  Neck: Neck supple.  Cardiovascular: Normal rate and regular rhythm.   Pulmonary/Chest: Breath sounds normal. No respiratory distress. She has no wheezes. She has no rales.  Abdominal: Soft. There is no tenderness. There is no rebound.  Musculoskeletal: She exhibits no edema.    CBC    Component Value Date/Time   WBC 8.1 09/26/2014 1520   RBC 4.62 09/26/2014 1520   RBC 4.75 01/11/2014 1155   HGB 9.5* 09/26/2014 1520   HCT 31.3* 09/26/2014 1520   PLT 333 09/26/2014 1520   MCV 67.7* 09/26/2014 1520   LYMPHSABS 3.3 01/07/2014 1033   MONOABS 0.4 01/07/2014 1033   EOSABS 0.1 01/07/2014 1033   BASOSABS 0.0 01/07/2014 1033    CMP     Component Value Date/Time   NA 143 09/26/2014 1520   K 4.3 09/26/2014 1520   CL 104 09/26/2014 1520   CO2 26 09/26/2014 1520   GLUCOSE 151* 09/26/2014 1520   BUN 10 09/26/2014 1520   CREATININE 0.83 09/26/2014 1520   CREATININE 0.83 04/09/2014 1230   CALCIUM 8.7 09/26/2014 1520   PROT 6.7 04/09/2014 1230   ALBUMIN 3.8 04/09/2014 1230   AST 12 04/09/2014 1230   ALT 8 04/09/2014 1230   ALKPHOS 73 04/09/2014 1230   BILITOT 0.2 04/09/2014 1230   GFRNONAA 84* 09/26/2014 1520   GFRNONAA 86 04/09/2014 1230   GFRAA >90 09/26/2014 1520   GFRAA >89 04/09/2014 1230    Lab Results  Component Value Date/Time   CHOL 241* 04/09/2014 12:30 PM    No components found for: HGA1C  Lab Results  Component Value Date/Time   AST 12 04/09/2014 12:30 PM     Assessment and Plan  URI (  upper respiratory infection) - Plan: azithromycin (ZITHROMAX Z-PAK) 250 MG tablet  Gastroesophageal reflux disease, esophagitis presence not specified - Plan:last modification, trial of omeprazole (PRILOSEC) 20 MG capsule  Nasal congestion - Plan: fluticasone (FLONASE) 50 MCG/ACT nasal spray, also advise patient for saltwater gargles  Cough - Plan: benzonatate (TESSALON) 100 MG capsule when necessary for cough   Return in about 3 months (around 01/27/2015), or if symptoms worsen or fail to improve.  Lorayne Marek, MD

## 2014-10-31 IMAGING — CT CT HEAD W/O CM
1 series · 16 of 30 positions shown, 20 images · non-contrast
Comparison: 05/18/2013 CT and MR..

CLINICAL DATA: Code stroke. Right arm numbness. Headache.

EXAM:
CT HEAD WITHOUT CONTRAST
TECHNIQUE: Contiguous axial images were obtained from the base of the skull
through the vertex without contrast.

[Series 2: head 5.0 h30s · axial · 0.41mm/px · z∈[-175,-40]mm · 16 of 30 slices shown, 20 images]
[im 2/30  brain]
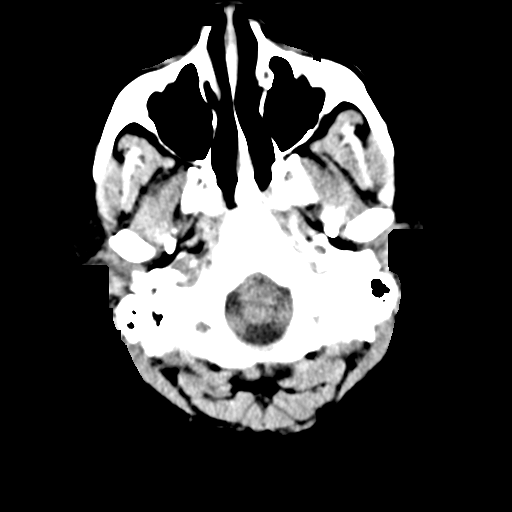
[im 2/30  bone]
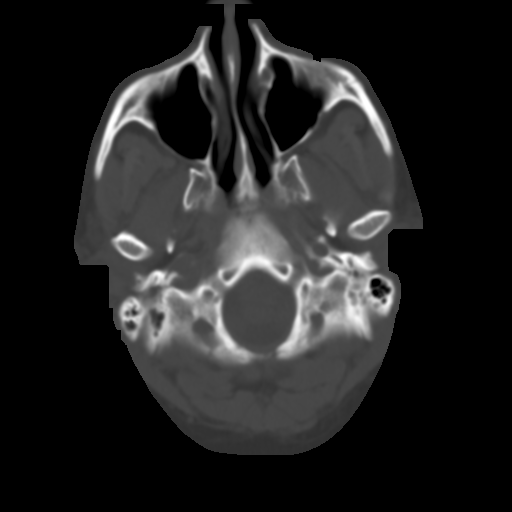
[im 4/30  brain]
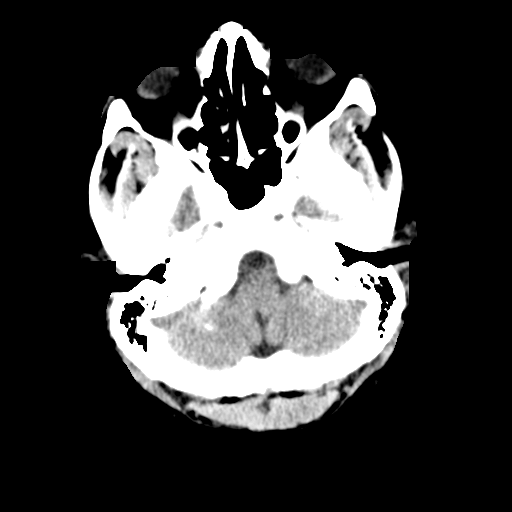
[im 6/30  brain]
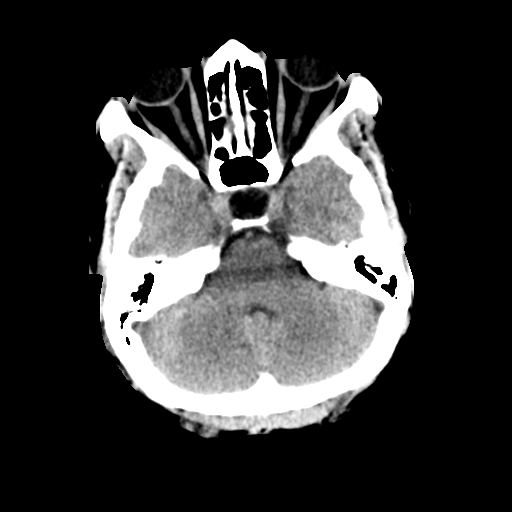
[im 8/30  brain]
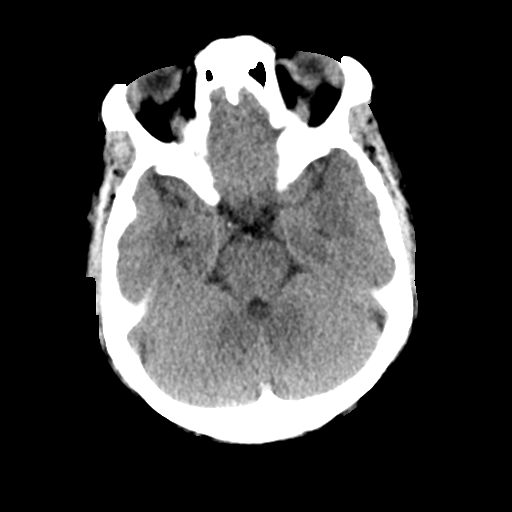
[im 9/30  brain]
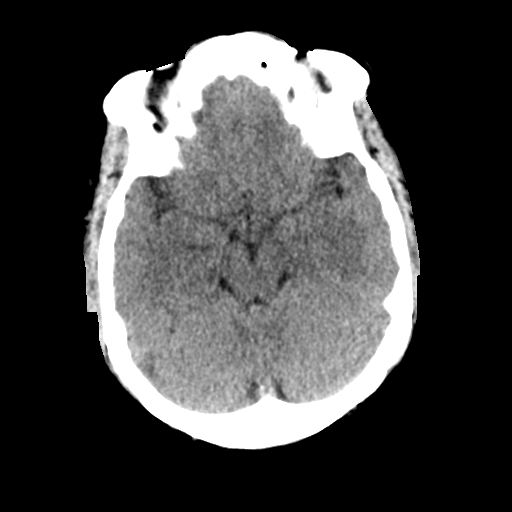
[im 9/30  bone]
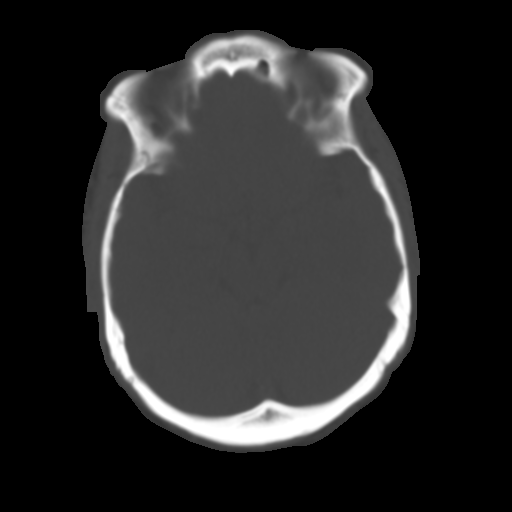
[im 11/30  brain]
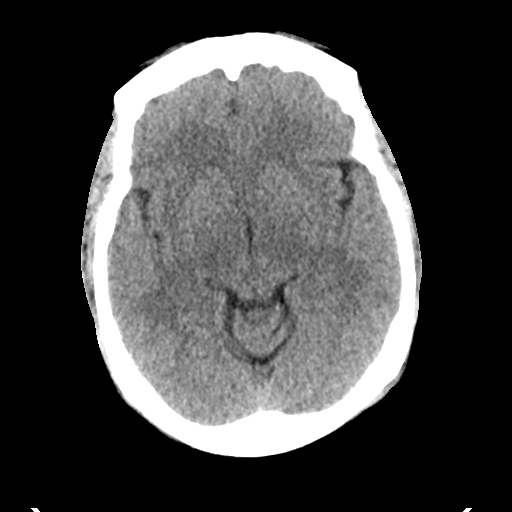
[im 13/30  brain]
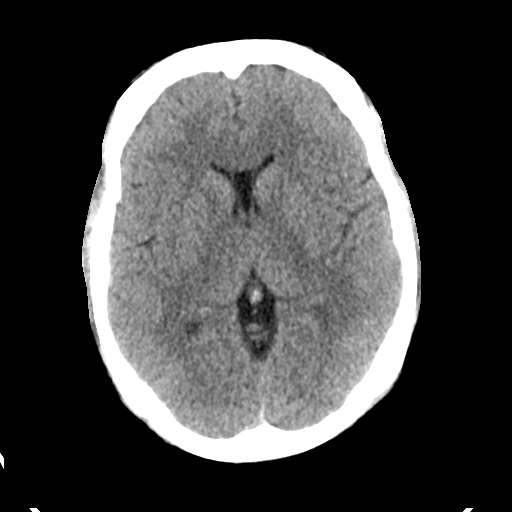
[im 15/30  brain]
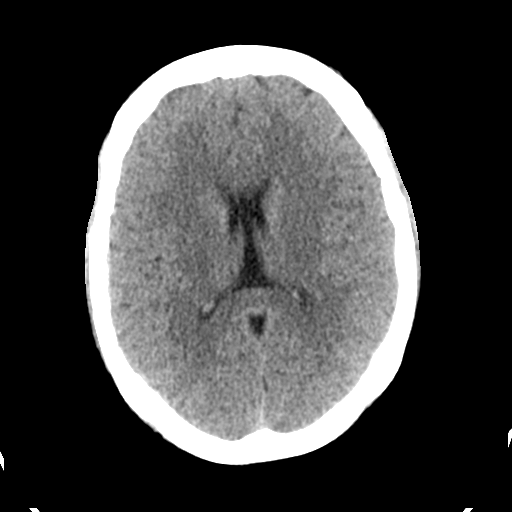
[im 16/30  brain]
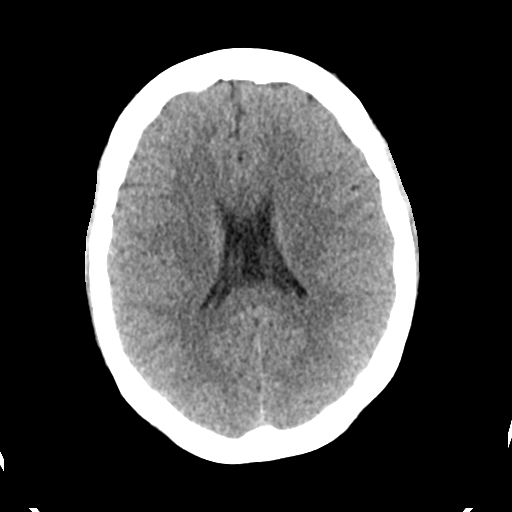
[im 16/30  bone]
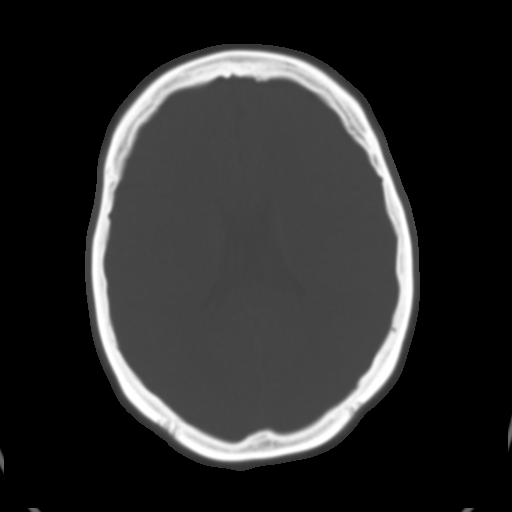
[im 18/30  brain]
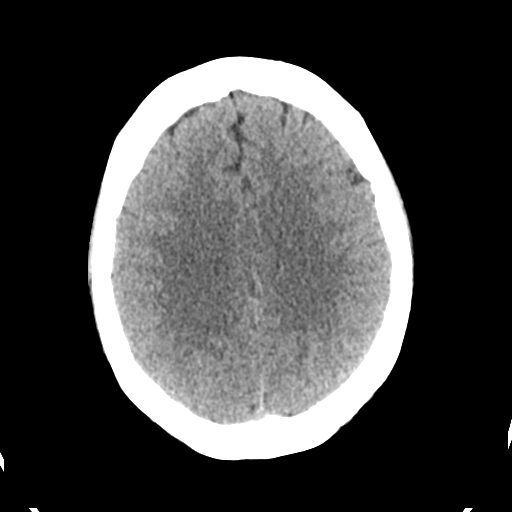
[im 20/30  brain]
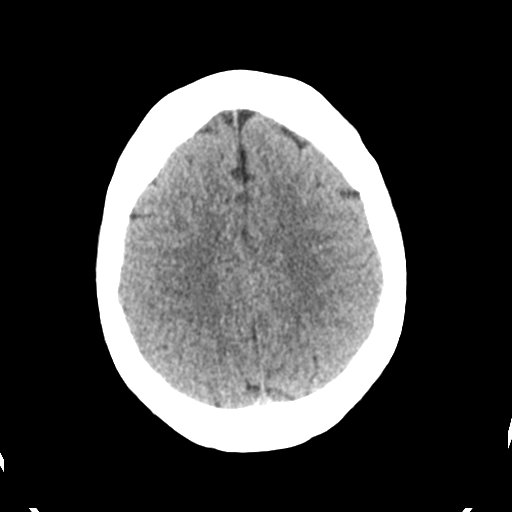
[im 22/30  brain]
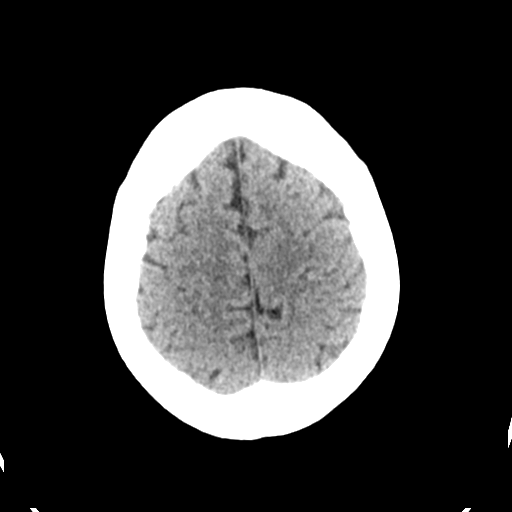
[im 23/30  brain]
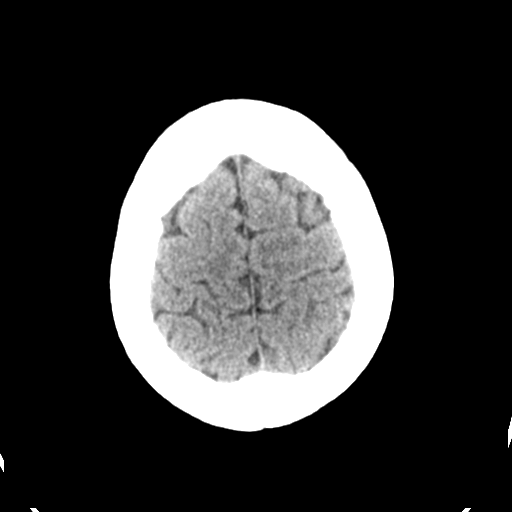
[im 23/30  bone]
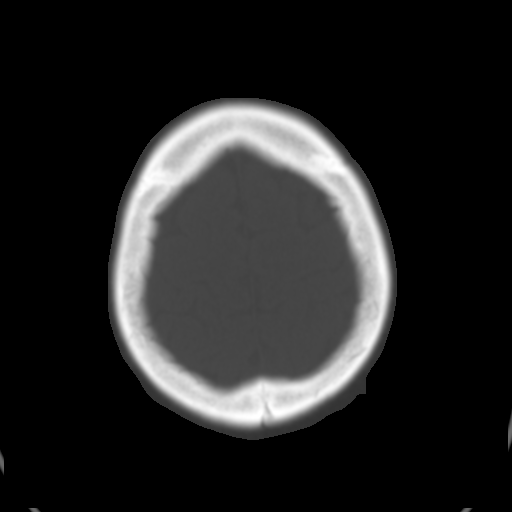
[im 25/30  brain]
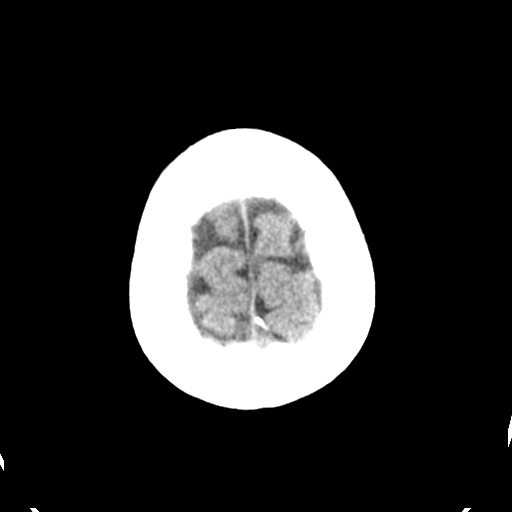
[im 27/30  brain]
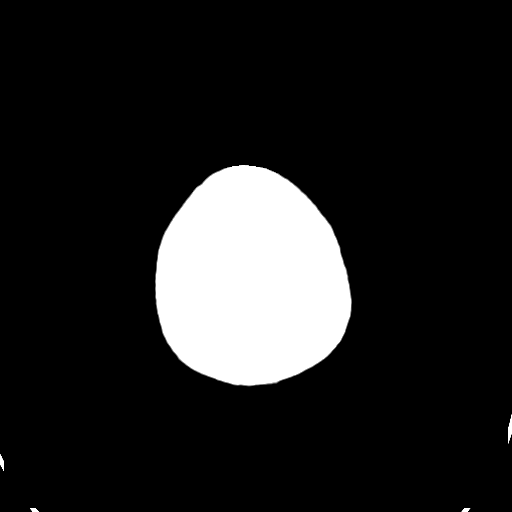
[im 29/30  brain]
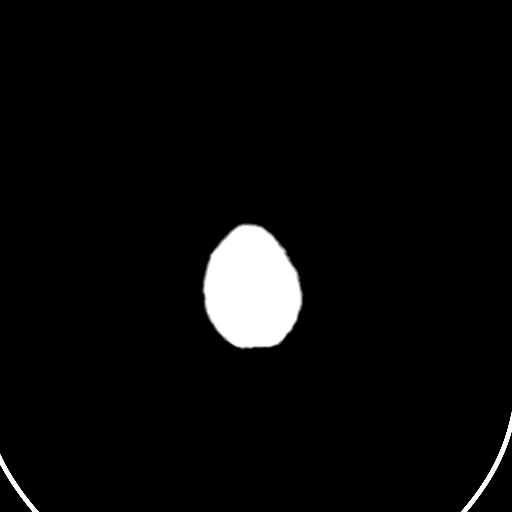

[16 of 30 positions shown; findings below may reference images not displayed]

FINDINGS: Normal appearance of the intracranial structures. No evidence for
acute hemorrhage, mass lesion, midline shift, hydrocephalus or large
infarct. No acute bony abnormality. The visualized sinuses are
clear. No change from priors.
IMPRESSION: No acute intracranial abnormality. No significant encephalomalacia
is seen as a result of previous MRI documented right hemispheric
ischemia.

Critical Value/emergent results were called by telephone at the time
of interpretation on 08/23/2013 at [DATE] to Dr.MADMOVE WAGATA , who
verbally acknowledged these results.

## 2014-12-16 ENCOUNTER — Telehealth: Payer: Self-pay | Admitting: Emergency Medicine

## 2014-12-16 ENCOUNTER — Ambulatory Visit: Payer: BLUE CROSS/BLUE SHIELD | Attending: Internal Medicine | Admitting: Physician Assistant

## 2014-12-16 VITALS — BP 138/88 | HR 73 | Temp 98.1°F | Resp 22 | Ht 64.0 in | Wt 209.8 lb

## 2014-12-16 DIAGNOSIS — J209 Acute bronchitis, unspecified: Secondary | ICD-10-CM | POA: Diagnosis present

## 2014-12-16 DIAGNOSIS — Z79899 Other long term (current) drug therapy: Secondary | ICD-10-CM | POA: Insufficient documentation

## 2014-12-16 DIAGNOSIS — F17200 Nicotine dependence, unspecified, uncomplicated: Secondary | ICD-10-CM

## 2014-12-16 DIAGNOSIS — Z720 Tobacco use: Secondary | ICD-10-CM

## 2014-12-16 DIAGNOSIS — Z7952 Long term (current) use of systemic steroids: Secondary | ICD-10-CM | POA: Diagnosis not present

## 2014-12-16 DIAGNOSIS — J42 Unspecified chronic bronchitis: Secondary | ICD-10-CM | POA: Diagnosis not present

## 2014-12-16 DIAGNOSIS — F17210 Nicotine dependence, cigarettes, uncomplicated: Secondary | ICD-10-CM | POA: Diagnosis not present

## 2014-12-16 DIAGNOSIS — R059 Cough, unspecified: Secondary | ICD-10-CM

## 2014-12-16 DIAGNOSIS — J440 Chronic obstructive pulmonary disease with acute lower respiratory infection: Secondary | ICD-10-CM

## 2014-12-16 DIAGNOSIS — R05 Cough: Secondary | ICD-10-CM

## 2014-12-16 MED ORDER — PREDNISONE 20 MG PO TABS: ORAL_TABLET | ORAL | Status: DC

## 2014-12-16 MED ORDER — BENZONATATE 100 MG PO CAPS: 100.0000 mg | ORAL_CAPSULE | Freq: Three times a day (TID) | ORAL | Status: DC | PRN

## 2014-12-16 MED ORDER — GUAIFENESIN-CODEINE 100-10 MG/5ML PO SOLN: 5.0000 mL | Freq: Four times a day (QID) | ORAL | Status: DC | PRN

## 2014-12-16 NOTE — Progress Notes (Signed)
Chief Complaint: Cough  Subjective: This is a 44 year old female with a history of bronchitis. She's had 3-4 presentations for increasing cough. She has been treated as recently as December for the same. At that time she received prednisone, Z-Pak, and Tessalon. She presents with similar symptoms. She said 2 days of increasing cough. Minimal sputum production. She has had no fevers or chills. She feels like something is sitting in her throat. His difficulty sleeping. She's been using her albuterol inhaler as needed as well as her nasal spray. She denies chest pain. She continues to smoke one half pack per day of cigarettes.   ROS:  GEN: denies fever or chills, denies change in weight Skin: denies lesions or rashes HEENT: denies headache, earache, epistaxis, sore throat, or neck pain LUNGS: denies SHOB, dyspnea, PND, orthopnea CV: denies CP or palpitations ABD: denies abd pain, N or V EXT: denies muscle spasms or swelling; no pain in lower ext, no weakness NEURO: denies numbness or tingling, denies sz, stroke or TIA   Objective:  Filed Vitals:   12/16/14 1029  BP: 138/88  Pulse: 73  Temp: 98.1 F (36.7 C)  TempSrc: Oral  Resp: 22  Height: 5\' 4"  (1.626 m)  Weight: 209 lb 12.8 oz (95.165 kg)  SpO2: 98%    Physical Exam:  General: in no acute distress. HEENT: no pallor, no icterus, moist oral mucosa, no JVD, no lymphadenopathy Heart: Normal  s1 &s2  Regular rate and rhythm, without murmurs, rubs, gallops. Lungs: Clear to auscultation bilaterally. Abdomen: Soft, nontender, nondistended, positive bowel sounds. Extremities: No clubbing cyanosis or edema with positive pedal pulses. Neuro: Alert, awake, oriented x3, nonfocal.  Pertinent Lab Results:none   Medications: Prior to Admission medications   Medication Sig Start Date End Date Taking? Authorizing Provider  albuterol (PROVENTIL HFA;VENTOLIN HFA) 108 (90 BASE) MCG/ACT inhaler Inhale 2 puffs into the lungs every 6 (six)  hours as needed for wheezing or shortness of breath. 10/17/14   Lorayne Marek, MD  ALPRAZolam Duanne Moron) 0.5 MG tablet Take 0.5 mg by mouth daily. 08/22/14   Historical Provider, MD  azithromycin (ZITHROMAX Z-PAK) 250 MG tablet Take as directed 10/28/14   Lorayne Marek, MD  benzonatate (TESSALON) 100 MG capsule Take 1 capsule (100 mg total) by mouth 3 (three) times daily as needed for cough. 12/16/14   Brayton Caves, PA-C  fluticasone (FLONASE) 50 MCG/ACT nasal spray Place 2 sprays into both nostrils daily. 10/28/14   Lorayne Marek, MD  guaiFENesin-codeine 100-10 MG/5ML syrup Take 5 mLs by mouth every 6 (six) hours as needed for cough. 12/16/14   Tiffany Daneil Dan, PA-C  meloxicam (MOBIC) 15 MG tablet Take 15 mg by mouth daily. 08/22/14   Historical Provider, MD  omeprazole (PRILOSEC) 20 MG capsule Take 1 capsule (20 mg total) by mouth daily. 10/28/14   Lorayne Marek, MD  oxyCODONE-acetaminophen (PERCOCET) 10-325 MG per tablet Take 1 tablet by mouth every 4 (four) hours as needed for pain. Patient not taking: Reported on 09/26/2014 03/07/14   Eustace Moore, MD  oxyCODONE-acetaminophen (PERCOCET/ROXICET) 5-325 MG per tablet Take 2 tablets by mouth every 6 (six) hours as needed for severe pain. 06/17/14   Montine Circle, PA-C  predniSONE (DELTASONE) 20 MG tablet Take 1 tab daily with breakfast for 5 days 12/16/14   Brayton Caves, PA-C    Assessment: 1. Acute on Chronic Bronchitis 2. Smoker    Plan: Prednisone Tessalon Cough syrup 6 week follow-up with primary care provider to discuss maintenance medications  and/or referral to pulmonary specialist Smoking cessation discussed  Follow up:6 weeks  The patient was given clear instructions to go to ER or return to medical center if symptoms don't improve, worsen or new problems develop. The patient verbalized understanding. The patient was told to call to get lab results if they haven't heard anything in the next week.   This note has been created with  Surveyor, quantity. Any transcriptional errors are unintentional.   Zettie Pho, PA-C 12/16/2014, 10:49 AM

## 2014-12-16 NOTE — Progress Notes (Signed)
Pt presents to clinic cough and vomiting from coughing. Denies fever or chills.

## 2015-01-14 ENCOUNTER — Other Ambulatory Visit: Payer: Self-pay | Admitting: Internal Medicine

## 2015-01-16 ENCOUNTER — Ambulatory Visit: Payer: BLUE CROSS/BLUE SHIELD | Attending: Internal Medicine | Admitting: Internal Medicine

## 2015-01-16 ENCOUNTER — Encounter: Payer: Self-pay | Admitting: Internal Medicine

## 2015-01-16 VITALS — BP 104/71 | HR 88 | Temp 98.6°F | Resp 16 | Ht 64.0 in | Wt 209.0 lb

## 2015-01-16 DIAGNOSIS — M25561 Pain in right knee: Secondary | ICD-10-CM | POA: Insufficient documentation

## 2015-01-16 DIAGNOSIS — Z79899 Other long term (current) drug therapy: Secondary | ICD-10-CM | POA: Insufficient documentation

## 2015-01-16 DIAGNOSIS — Z720 Tobacco use: Secondary | ICD-10-CM

## 2015-01-16 DIAGNOSIS — J449 Chronic obstructive pulmonary disease, unspecified: Secondary | ICD-10-CM | POA: Diagnosis not present

## 2015-01-16 DIAGNOSIS — J209 Acute bronchitis, unspecified: Secondary | ICD-10-CM | POA: Insufficient documentation

## 2015-01-16 DIAGNOSIS — Z8673 Personal history of transient ischemic attack (TIA), and cerebral infarction without residual deficits: Secondary | ICD-10-CM | POA: Diagnosis not present

## 2015-01-16 DIAGNOSIS — Z7952 Long term (current) use of systemic steroids: Secondary | ICD-10-CM | POA: Insufficient documentation

## 2015-01-16 DIAGNOSIS — F17210 Nicotine dependence, cigarettes, uncomplicated: Secondary | ICD-10-CM | POA: Diagnosis not present

## 2015-01-16 DIAGNOSIS — J440 Chronic obstructive pulmonary disease with acute lower respiratory infection: Secondary | ICD-10-CM

## 2015-01-16 DIAGNOSIS — F17200 Nicotine dependence, unspecified, uncomplicated: Secondary | ICD-10-CM

## 2015-01-16 MED ORDER — NICOTINE 21 MG/24HR TD PT24: 21.0000 mg | MEDICATED_PATCH | Freq: Every day | TRANSDERMAL | Status: DC

## 2015-01-16 MED ORDER — AZITHROMYCIN 250 MG PO TABS: ORAL_TABLET | ORAL | Status: DC

## 2015-01-16 MED ORDER — GABAPENTIN 100 MG PO CAPS: 100.0000 mg | ORAL_CAPSULE | Freq: Three times a day (TID) | ORAL | Status: DC

## 2015-01-16 NOTE — Progress Notes (Signed)
MRN: [id number] Name: [name]  Sex: female Age: 44 y.o. DOB: 06/01/1971  Allergies: Shellfish allergy and Latex  Chief Complaint  Patient presents with  . Follow-up  . Bronchitis  . Medication Refill    HPI: Patient is 44 y.o. female who has symptoms of chronic bronchitis, patient has been treated in the past with antibiotics as well as currently she is using albuterol when necessary, patient never had PFTs done she has been smoking cigarettes, I have counseled patient to quit smoking. Patient also reported to have chronic right knee pain as per patient she was following up with orthopedics and had some surgery done, as per patient she was told that she has arthritis and she can take pain medications, as per patient she has tried [facility] currently taking ibuprofen which has not helped him much, she is requesting a strong pain medication.  Past Medical History  Diagnosis Date  . Stroke 05/2013  . History of TIAs   . Pneumonia   . Headache(784.0)   . Numbness     Right hand  . Anemia   . History of blood transfusion     Hx; of in 1991 after delivery    Past Surgical History  Procedure Laterality Date  . Cesarean section      x 1 with 5th pregnancy  . Cholecystectomy    . Tee without cardioversion N/A 05/21/2013    Procedure: TRANSESOPHAGEAL ECHOCARDIOGRAM (TEE);  Surgeon: Sueanne Margarita, MD;  Location: [facility] ENDOSCOPY;  Service: Cardiovascular;  Laterality: N/A;  . Tubal ligation    . Anterior cervical decomp/discectomy fusion N/A 03/06/2014    Procedure: ANTERIOR CERVICAL DECOMPRESSION/DISCECTOMY FUSION 2 LEVELS four/five, five/six;  Surgeon: Eustace Moore, MD;  Location: [facility] NEURO ORS;  Service: Neurosurgery;  Laterality: N/A;      Medication List       This list is accurate as of: 01/16/15  5:21 PM.  Always use your most recent med list.               albuterol 108 (90 BASE) MCG/ACT inhaler  Commonly known as:  PROVENTIL HFA;VENTOLIN HFA  Inhale 2 puffs into  the lungs every 6 (six) hours as needed for wheezing or shortness of breath.     ALPRAZolam 0.5 MG tablet  Commonly known as:  XANAX  Take 0.5 mg by mouth daily.     azithromycin 250 MG tablet  Commonly known as:  ZITHROMAX Z-PAK  Take as directed     azithromycin 250 MG tablet  Commonly known as:  ZITHROMAX Z-PAK  Take as directed     benzonatate 100 MG capsule  Commonly known as:  TESSALON  Take 1 capsule (100 mg total) by mouth 3 (three) times daily as needed for cough.     fluticasone 50 MCG/ACT nasal spray  Commonly known as:  FLONASE  Place 2 sprays into both nostrils daily.     gabapentin 100 MG capsule  Commonly known as:  NEURONTIN  Take 1 capsule (100 mg total) by mouth 3 (three) times daily.     guaiFENesin-codeine 100-10 MG/5ML syrup  Take 5 mLs by mouth every 6 (six) hours as needed for cough.     meloxicam 15 MG tablet  Commonly known as:  MOBIC  Take 15 mg by mouth daily.     nicotine 21 mg/24hr patch  Commonly known as:  NICODERM CQ  Place 1 patch (21 mg total) onto the skin daily.  omeprazole 20 MG capsule  Commonly known as:  PRILOSEC  Take 1 capsule (20 mg total) by mouth daily.     oxyCODONE-acetaminophen 10-325 MG per tablet  Commonly known as:  PERCOCET  Take 1 tablet by mouth every 4 (four) hours as needed for pain.     oxyCODONE-acetaminophen 5-325 MG per tablet  Commonly known as:  PERCOCET/ROXICET  Take 2 tablets by mouth every 6 (six) hours as needed for severe pain.     predniSONE 20 MG tablet  Commonly known as:  DELTASONE  Take 1 tab daily with breakfast for 5 days        Meds ordered this encounter  Medications  . nicotine (NICODERM CQ) 21 mg/24hr patch    Sig: Place 1 patch (21 mg total) onto the skin daily.    Dispense:  28 patch    Refill:  0  . gabapentin (NEURONTIN) 100 MG capsule    Sig: Take 1 capsule (100 mg total) by mouth 3 (three) times daily.    Dispense:  90 capsule    Refill:  3  . azithromycin  (ZITHROMAX Z-PAK) 250 MG tablet    Sig: Take as directed    Dispense:  6 each    Refill:  0    Immunization History  Administered Date(s) Administered  . Influenza-Unspecified 08/08/2014  . Pneumococcal Polysaccharide-23 05/21/2013    Family History  Problem Relation Age of Onset  . Renal Disease Father     dialysis  . Hypertension Father   . Heart disease Mother     stabbed in heart    History  Substance Use Topics  . Smoking status: Former Smoker -- 0.50 packs/day for 28 years    Types: Cigarettes    Quit date: 09/26/2014  . Smokeless tobacco: Never Used     Comment: pt has script for Chantix  . Alcohol Use: No    Review of Systems   As noted in HPI  Filed Vitals:   01/16/15 1631  BP: 104/71  Pulse: 88  Temp: 98.6 F (37 C)  Resp: 16    Physical Exam  Physical Exam  Constitutional: No distress.  Eyes: EOM are normal. Pupils are equal, round, and reactive to light.  Cardiovascular: Normal rate and regular rhythm.   Pulmonary/Chest: Breath sounds normal. No respiratory distress. She has no wheezes. She has no rales.    CBC    Component Value Date/Time   WBC 8.1 09/26/2014 1520   RBC 4.62 09/26/2014 1520   RBC 4.75 01/11/2014 1155   HGB 9.5* 09/26/2014 1520   HCT 31.3* 09/26/2014 1520   PLT 333 09/26/2014 1520   MCV 67.7* 09/26/2014 1520   LYMPHSABS 3.3 01/07/2014 1033   MONOABS 0.4 01/07/2014 1033   EOSABS 0.1 01/07/2014 1033   BASOSABS 0.0 01/07/2014 1033    CMP     Component Value Date/Time   NA 143 09/26/2014 1520   K 4.3 09/26/2014 1520   CL 104 09/26/2014 1520   CO2 26 09/26/2014 1520   GLUCOSE 151* 09/26/2014 1520   BUN 10 09/26/2014 1520   CREATININE 0.83 09/26/2014 1520   CREATININE 0.83 04/09/2014 1230   CALCIUM 8.7 09/26/2014 1520   PROT 6.7 04/09/2014 1230   ALBUMIN 3.8 04/09/2014 1230   AST 12 04/09/2014 1230   ALT 8 04/09/2014 1230   ALKPHOS 73 04/09/2014 1230   BILITOT 0.2 04/09/2014 1230   GFRNONAA 84* 09/26/2014  1520   GFRNONAA 86 04/09/2014 1230   GFRAA >  90 09/26/2014 1520   GFRAA >89 04/09/2014 1230    Lab Results  Component Value Date/Time   CHOL 241* 04/09/2014 12:30 PM    No components found for: HGA1C  Lab Results  Component Value Date/Time   AST 12 04/09/2014 12:30 PM    Assessment and Plan  Bronchitis, chronic obstructive w acute bronchitis - Plan: Ambulatory referral to Pulmonology to get baseline PFTs, she'll continue with albuterol, azithromycin (ZITHROMAX Z-PAK) 250 MG tablet  Smoking - Plan:counseled patient to quit smoking, trial of  nicotine (NICODERM CQ) 21 mg/24hr patch  Right knee pain - Plan: gabapentin (NEURONTIN) 100 MG capsule, Ambulatory referral to [place] [facility]  -Vaccinations:  Up-to-date with flu shot  Return in about 3 months (around 04/18/2015), or if symptoms worsen or fail to improve.   This note has been created with Surveyor, quantity. Any transcriptional errors are unintentional.    Lorayne Marek, MD

## 2015-01-16 NOTE — Progress Notes (Signed)
Pt comes in today for treatment of Bronchitis flare up C/o cough, freq white phlegm x 3 days Denies sob or chest pressure  Sats 97% r/a C/o right knee pain s/p surgery x many years Requesting pain medication  Need pap smear, PNA vaccine

## 2015-01-20 ENCOUNTER — Other Ambulatory Visit: Payer: Self-pay

## 2015-01-20 MED ORDER — ALBUTEROL SULFATE HFA 108 (90 BASE) MCG/ACT IN AERS: 2.0000 | INHALATION_SPRAY | Freq: Four times a day (QID) | RESPIRATORY_TRACT | Status: DC | PRN

## 2015-02-20 ENCOUNTER — Institutional Professional Consult (permissible substitution): Payer: BLUE CROSS/BLUE SHIELD | Admitting: Internal Medicine

## 2015-02-21 ENCOUNTER — Ambulatory Visit: Payer: 59 | Attending: Internal Medicine | Admitting: Internal Medicine

## 2015-02-21 ENCOUNTER — Encounter: Payer: Self-pay | Admitting: Internal Medicine

## 2015-02-21 VITALS — BP 110/77 | HR 106 | Temp 98.7°F | Resp 16 | Ht 63.0 in | Wt 207.0 lb

## 2015-02-21 DIAGNOSIS — K219 Gastro-esophageal reflux disease without esophagitis: Secondary | ICD-10-CM | POA: Diagnosis present

## 2015-02-21 DIAGNOSIS — J449 Chronic obstructive pulmonary disease, unspecified: Secondary | ICD-10-CM | POA: Insufficient documentation

## 2015-02-21 DIAGNOSIS — R062 Wheezing: Secondary | ICD-10-CM | POA: Diagnosis not present

## 2015-02-21 DIAGNOSIS — R059 Cough, unspecified: Secondary | ICD-10-CM

## 2015-02-21 DIAGNOSIS — R05 Cough: Secondary | ICD-10-CM | POA: Diagnosis not present

## 2015-02-21 DIAGNOSIS — J440 Chronic obstructive pulmonary disease with acute lower respiratory infection: Secondary | ICD-10-CM

## 2015-02-21 DIAGNOSIS — R112 Nausea with vomiting, unspecified: Secondary | ICD-10-CM | POA: Diagnosis not present

## 2015-02-21 MED ORDER — OMEPRAZOLE 20 MG PO CPDR: 20.0000 mg | DELAYED_RELEASE_CAPSULE | Freq: Every day | ORAL | Status: DC

## 2015-02-21 MED ORDER — AMOXICILLIN-POT CLAVULANATE 875-125 MG PO TABS: 1.0000 | ORAL_TABLET | Freq: Two times a day (BID) | ORAL | Status: DC

## 2015-02-21 MED ORDER — BENZONATATE 100 MG PO CAPS: 100.0000 mg | ORAL_CAPSULE | Freq: Three times a day (TID) | ORAL | Status: DC | PRN

## 2015-02-21 MED ORDER — METOCLOPRAMIDE HCL 10 MG PO TABS: 10.0000 mg | ORAL_TABLET | Freq: Three times a day (TID) | ORAL | Status: DC | PRN

## 2015-02-21 MED ORDER — ALBUTEROL SULFATE HFA 108 (90 BASE) MCG/ACT IN AERS: 2.0000 | INHALATION_SPRAY | Freq: Four times a day (QID) | RESPIRATORY_TRACT | Status: DC | PRN

## 2015-02-21 MED ORDER — METHYLPREDNISOLONE 4 MG PO TBPK: ORAL_TABLET | ORAL | Status: DC

## 2015-02-21 NOTE — Progress Notes (Signed)
Complaining of Dry cough x 1 wk

## 2015-02-21 NOTE — Progress Notes (Signed)
MRN: [id number] Name: [name]  Sex: female Age: 44 y.o. DOB: [birth year]  Allergies: Shellfish allergy and Latex  Chief Complaint  Patient presents with  . Cough    HPI: Patient is 44 y.o. female who history of chronic bronchitis, has been treated with antibiotics several times in the past, patient then out of her inhaler she was referred to pulmonology as per patient she missed her appointment, for the last few days her symptoms are worse denies any fever chills but has persistent cough she feels nauseous and has vomited several times, denies any  change in bowel habits, does complain of worsening reflux symptoms she ran out of her medication..  Past Medical History  Diagnosis Date  . Stroke 05/2013  . History of TIAs   . Pneumonia   . Headache(784.0)   . Numbness     Right hand  . Anemia   . History of blood transfusion     Hx; of in 1991 after delivery    Past Surgical History  Procedure Laterality Date  . Cesarean section      x 1 with 5th pregnancy  . Cholecystectomy    . Tee without cardioversion N/A 05/21/2013    Procedure: TRANSESOPHAGEAL ECHOCARDIOGRAM (TEE);  Surgeon: Sueanne Margarita, MD;  Location: [facility] ENDOSCOPY;  Service: Cardiovascular;  Laterality: N/A;  . Tubal ligation    . Anterior cervical decomp/discectomy fusion N/A 03/06/2014    Procedure: ANTERIOR CERVICAL DECOMPRESSION/DISCECTOMY FUSION 2 LEVELS four/five, five/six;  Surgeon: Eustace Moore, MD;  Location: [facility] NEURO ORS;  Service: Neurosurgery;  Laterality: N/A;      Medication List       This list is accurate as of: 02/21/15 12:33 PM.  Always use your most recent med list.               albuterol 108 (90 BASE) MCG/ACT inhaler  Commonly known as:  PROVENTIL HFA;VENTOLIN HFA  Inhale 2 puffs into the lungs every 6 (six) hours as needed for wheezing or shortness of breath.     ALPRAZolam 0.5 MG tablet  Commonly known as:  XANAX  Take 0.5 mg by mouth daily.     amoxicillin-clavulanate  875-125 MG per tablet  Commonly known as:  AUGMENTIN  Take 1 tablet by mouth 2 (two) times daily.     azithromycin 250 MG tablet  Commonly known as:  ZITHROMAX Z-PAK  Take as directed     azithromycin 250 MG tablet  Commonly known as:  ZITHROMAX Z-PAK  Take as directed     benzonatate 100 MG capsule  Commonly known as:  TESSALON  Take 1 capsule (100 mg total) by mouth 3 (three) times daily as needed for cough.     fluticasone 50 MCG/ACT nasal spray  Commonly known as:  FLONASE  Place 2 sprays into both nostrils daily.     gabapentin 100 MG capsule  Commonly known as:  NEURONTIN  Take 1 capsule (100 mg total) by mouth 3 (three) times daily.     guaiFENesin-codeine 100-10 MG/5ML syrup  Take 5 mLs by mouth every 6 (six) hours as needed for cough.     meloxicam 15 MG tablet  Commonly known as:  MOBIC  Take 15 mg by mouth daily.     methylPREDNISolone 4 MG Tbpk tablet  Commonly known as:  MEDROL DOSEPAK  follow package directions     metoCLOPramide 10 MG tablet  Commonly known as:  REGLAN  Take 1 tablet (10 mg  total) by mouth every 8 (eight) hours as needed for nausea or vomiting.     nicotine 21 mg/24hr patch  Commonly known as:  NICODERM CQ  Place 1 patch (21 mg total) onto the skin daily.     omeprazole 20 MG capsule  Commonly known as:  PRILOSEC  Take 1 capsule (20 mg total) by mouth daily.     oxyCODONE-acetaminophen 10-325 MG per tablet  Commonly known as:  PERCOCET  Take 1 tablet by mouth every 4 (four) hours as needed for pain.     oxyCODONE-acetaminophen 5-325 MG per tablet  Commonly known as:  PERCOCET/ROXICET  Take 2 tablets by mouth every 6 (six) hours as needed for severe pain.     predniSONE 20 MG tablet  Commonly known as:  DELTASONE  Take 1 tab daily with breakfast for 5 days        Meds ordered this encounter  Medications  . omeprazole (PRILOSEC) 20 MG capsule    Sig: Take 1 capsule (20 mg total) by mouth daily.    Dispense:  30 capsule     Refill:  3  . albuterol (PROVENTIL HFA;VENTOLIN HFA) 108 (90 BASE) MCG/ACT inhaler    Sig: Inhale 2 puffs into the lungs every 6 (six) hours as needed for wheezing or shortness of breath.    Dispense:  1 Inhaler    Refill:  0  . benzonatate (TESSALON) 100 MG capsule    Sig: Take 1 capsule (100 mg total) by mouth 3 (three) times daily as needed for cough.    Dispense:  30 capsule    Refill:  1  . amoxicillin-clavulanate (AUGMENTIN) 875-125 MG per tablet    Sig: Take 1 tablet by mouth 2 (two) times daily.    Dispense:  20 tablet    Refill:  0  . metoCLOPramide (REGLAN) 10 MG tablet    Sig: Take 1 tablet (10 mg total) by mouth every 8 (eight) hours as needed for nausea or vomiting.    Dispense:  60 tablet    Refill:  1  . methylPREDNISolone (MEDROL DOSEPAK) 4 MG TBPK tablet    Sig: follow package directions    Dispense:  21 tablet    Refill:  0    Immunization History  Administered Date(s) Administered  . Influenza-Unspecified 08/08/2014  . Pneumococcal Polysaccharide-23 05/21/2013    Family History  Problem Relation Age of Onset  . Renal Disease Father     dialysis  . Hypertension Father   . Heart disease Mother     stabbed in heart    History  Substance Use Topics  . Smoking status: Former Smoker -- 0.50 packs/day for 28 years    Types: Cigarettes    Quit date: 09/26/2014  . Smokeless tobacco: Never Used     Comment: pt has script for Chantix  . Alcohol Use: No    Review of Systems   As noted in HPI  Filed Vitals:   02/21/15 0954  BP: 110/77  Pulse: 106  Temp: 98.7 F (37.1 C)  Resp: 16    Physical Exam  Physical Exam  Constitutional:  Patient has been coughing intermittently  HENT:  Nasal congestion no sinus tenderness  Eyes: EOM are normal. Pupils are equal, round, and reactive to light.  Cardiovascular: Normal rate and regular rhythm.  Exam reveals no friction rub.   Pulmonary/Chest:  Minimal wheezing  Abdominal: Soft. There is no  tenderness.  Musculoskeletal: She exhibits no edema.    CBC  Component Value Date/Time   WBC 8.1 09/26/2014 1520   RBC 4.62 09/26/2014 1520   RBC 4.75 01/11/2014 1155   HGB 9.5* 09/26/2014 1520   HCT 31.3* 09/26/2014 1520   PLT 333 09/26/2014 1520   MCV 67.7* 09/26/2014 1520   LYMPHSABS 3.3 01/07/2014 1033   MONOABS 0.4 01/07/2014 1033   EOSABS 0.1 01/07/2014 1033   BASOSABS 0.0 01/07/2014 1033    CMP     Component Value Date/Time   NA 143 09/26/2014 1520   K 4.3 09/26/2014 1520   CL 104 09/26/2014 1520   CO2 26 09/26/2014 1520   GLUCOSE 151* 09/26/2014 1520   BUN 10 09/26/2014 1520   CREATININE 0.83 09/26/2014 1520   CREATININE 0.83 04/09/2014 1230   CALCIUM 8.7 09/26/2014 1520   PROT 6.7 04/09/2014 1230   ALBUMIN 3.8 04/09/2014 1230   AST 12 04/09/2014 1230   ALT 8 04/09/2014 1230   ALKPHOS 73 04/09/2014 1230   BILITOT 0.2 04/09/2014 1230   GFRNONAA 84* 09/26/2014 1520   GFRNONAA 86 04/09/2014 1230   GFRAA >90 09/26/2014 1520   GFRAA >89 04/09/2014 1230    Lab Results  Component Value Date/Time   CHOL 241* 04/09/2014 12:30 PM    Lab Results  Component Value Date/Time   HGBA1C 5.6 01/07/2014 10:59 AM   HGBA1C 6.1* 08/24/2013 05:30 AM    Lab Results  Component Value Date/Time   AST 12 04/09/2014 12:30 PM    Assessment and Plan  Gastroesophageal reflux disease, esophagitis presence not specified - Plan: patient is given refill on her medication omeprazole (PRILOSEC) 20 MG capsule  Bronchitis, chronic obstructive w acute bronchitis - Plan: amoxicillin-clavulanate (AUGMENTIN) 875-125 MG per tablet, methylPREDNISolone (MEDROL DOSEPAK) 4 MG TBPK tablet  Cough - Plan: benzonatate (TESSALON) 100 MG capsule  Wheezing - Plan: albuterol (PROVENTIL HFA;VENTOLIN HFA) 108 (90 BASE) MCG/ACT inhaler  Non-intractable vomiting with nausea, vomiting of unspecified type - Plan: metoCLOPramide (REGLAN) 10 MG tablet  Patient has scheduled appointment with   pulmonologist.  Return if symptoms worsen or fail to improve.   This note has been created with Surveyor, quantity. Any transcriptional errors are unintentional.    Lorayne Marek, MD

## 2015-03-11 ENCOUNTER — Ambulatory Visit (INDEPENDENT_AMBULATORY_CARE_PROVIDER_SITE_OTHER): Payer: 59 | Admitting: Internal Medicine

## 2015-03-11 ENCOUNTER — Ambulatory Visit (INDEPENDENT_AMBULATORY_CARE_PROVIDER_SITE_OTHER)
Admission: RE | Admit: 2015-03-11 | Discharge: 2015-03-11 | Disposition: A | Discharge status: Home / Self Care | Payer: 59 | Source: Ambulatory Visit | Attending: Internal Medicine | Admitting: Internal Medicine

## 2015-03-11 ENCOUNTER — Encounter: Payer: Self-pay | Admitting: Internal Medicine

## 2015-03-11 VITALS — BP 106/60 | HR 90 | Ht 63.0 in | Wt 209.6 lb

## 2015-03-11 DIAGNOSIS — R05 Cough: Secondary | ICD-10-CM | POA: Diagnosis not present

## 2015-03-11 DIAGNOSIS — R058 Other specified cough: Secondary | ICD-10-CM | POA: Insufficient documentation

## 2015-03-11 MED ORDER — PANTOPRAZOLE SODIUM 40 MG PO TBEC: 40.0000 mg | DELAYED_RELEASE_TABLET | Freq: Every day | ORAL | Status: DC

## 2015-03-11 MED ORDER — FAMOTIDINE 20 MG PO TABS: ORAL_TABLET | ORAL | Status: DC

## 2015-03-11 NOTE — Progress Notes (Signed)
Quick Note:  Spoke with pt and notified of results per Dr. Wert. Pt verbalized understanding and denied any questions.  ______ 

## 2015-03-11 NOTE — Patient Instructions (Addendum)
Reglan 10 mg take 30 min before every meal and at bedtime until you run out of the prescription   Pantoprazole (protonix) 40 mg   Take 30-60 min before first meal of the day and Pepcid 20 mg one bedtime until return to office    For cough take delsym up to 2 tsp every 12 hours as needed   Take chlortrimeton (chlorpheniramine) 4 mg x 2 at bedtime   available over the counter (may cause drowsiness)   GERD (REFLUX)  is an extremely common cause of respiratory symptoms just like yours , many times with no obvious heartburn at all.    It can be treated with medication, but also with lifestyle changes including avoidance of late meals, excessive alcohol, smoking cessation, and avoid fatty foods, chocolate, peppermint, colas, red wine, and acidic juices such as orange juice.  NO MINT OR MENTHOL PRODUCTS SO NO COUGH DROPS  USE SUGARLESS CANDY INSTEAD (Jolley ranchers or Stover's or Life Savers) or even ice chips will also do - the key is to swallow to prevent all throat clearing. NO OIL BASED VITAMINS - use powdered substitutes.     Please remember to go to the  x-ray department downstairs for your tests - we will call you with the results when they are available.  Please schedule a follow up office visit in 2 weeks, sooner if needed

## 2015-03-11 NOTE — Progress Notes (Signed)
Subjective:    Patient ID: [name], female    DOB: 1970/12/10,    MRN: [id number]  HPI  30 yobf quit smoking 02/07/15 with h/o pna as child but no problem with sports or needing inhalers and dx with bronchitis 2014 rx saba completely recovered and no need for maint inhalers then oct 2015 insidious onset progressively worse cough dx as recurrent bronchitis transiently better with prednisone and abx but not resolved so  referred to pulmonary clinic by Dr Annitta Needs 03/11/2015   03/11/2015 1st [facility] office visit/ Daffney Greenly   Chief Complaint  Patient presents with  . Pulmonary Consult    Referred by Dr. Lorayne Marek.  Pt c/o cough off and on since Oct 2015.  Cough is non prod and sometimes coughs until she vomits. Cough seems worse when she lies down.  She has albuterol inhaler but has not needed to use recently.   last episode of severe daytime cough  was sev weeks prior to OV  rx with abx and steroids Has not been able to sleep flat x 6 months even with abx / prednisone / dry hacking cough   Not limited by breathing from desired activities   No obvious other patterns in day to day or daytime variabilty or assoc  cp or chest tightness, subjective wheeze overt sinus or hb symptoms. No unusual exp hx or h/o childhood pna/ asthma or knowledge of premature birth.  Sleeping ok without nocturnal  or early am exacerbation  of respiratory  c/o's or need for noct saba. Also denies any obvious fluctuation of symptoms with weather or environmental changes or other aggravating or alleviating factors except as outlined above   Current Medications, Allergies, Complete Past Medical History, Past Surgical History, Family History, and Social History were reviewed in [employer] record.              Review of Systems  Constitutional: Negative for fever, chills and unexpected weight change.  HENT: Positive for ear pain and sore throat. Negative for congestion, dental problem,  nosebleeds, postnasal drip, rhinorrhea, sinus pressure, sneezing, trouble swallowing and voice change.   Eyes: Negative for visual disturbance.  Respiratory: Positive for cough. Negative for choking and shortness of breath.   Cardiovascular: Negative for chest pain and leg swelling.  Gastrointestinal: Negative for vomiting, abdominal pain and diarrhea.  Genitourinary: Negative for difficulty urinating.       Acid heartburn  Musculoskeletal: Positive for arthralgias.  Skin: Negative for rash.  Neurological: Positive for headaches. Negative for tremors and syncope.  Hematological: Does not bruise/bleed easily.       Objective:   Physical Exam  amb bf nad harsh barking upper airway cough  Wt Readings from Last 3 Encounters:  03/11/15 209 lb 9.6 oz (95.074 kg)  02/21/15 207 lb (93.895 kg)  01/16/15 209 lb (94.802 kg)    Vital signs reviewed   HEENT: nl dentition, turbinates, and orophanx. Nl external ear canals without cough reflex   NECK :  without JVD/Nodes/TM/ nl carotid upstrokes bilaterally   LUNGS: no acc muscle use, clear to A and P bilaterally without cough on insp or exp maneuvers   CV:  RRR  no s3 or murmur or increase in P2, no edema   ABD:  soft and nontender with nl excursion in the supine position. No bruits or organomegaly, bowel sounds nl  MS:  warm without deformities, calf tenderness, cyanosis or clubbing  SKIN: warm and dry without lesions  NEURO:  alert, approp, no deficits    CXR PA and Lateral:   03/11/2015 :     I personally reviewed images and agree with radiology impression as follows:     Chronic bronchitic changes. No pneumonia nor other acute abnormality.      Assessment & Plan:

## 2015-03-12 ENCOUNTER — Encounter: Payer: Self-pay | Admitting: Internal Medicine

## 2015-03-12 NOTE — Assessment & Plan Note (Addendum)
The most common causes of chronic cough in immunocompetent adults include the following: upper airway cough syndrome (UACS), previously referred to as postnasal drip syndrome (PNDS), which is caused by variety of rhinosinus conditions; (2) asthma; (3) GERD; (4) chronic bronchitis from cigarette smoking or other inhaled environmental irritants; (5) nonasthmatic eosinophilic bronchitis; and (6) bronchiectasis.   These conditions, singly or in combination, have accounted for up to 94% of the causes of chronic cough in prospective studies.   Other conditions have constituted no >6% of the causes in prospective studies These have included bronchogenic carcinoma, chronic interstitial pneumonia, sarcoidosis, left ventricular failure, ACEI-induced cough, and aspiration from a condition associated with pharyngeal dysfunction.    Chronic cough is often simultaneously caused by more than one condition. A single cause has been found from 38 to 82% of the time, multiple causes from 18 to 62%. Multiply caused cough has been the result of three diseases up to 42% of the time.       Based on hx and exam, this is most likely:  Classic Upper airway cough syndrome, so named because it's frequently impossible to sort out how much is  CR/sinusitis with freq throat clearing (which can be related to primary GERD)   vs  causing  secondary (" extra esophageal")  GERD from wide swings in gastric pressure that occur with throat clearing, often  promoting self use of mint and menthol lozenges that reduce the lower esophageal sphincter tone and exacerbate the problem further in a cyclical fashion.   These are the same pts (now being labeled as having "irritable larynx syndrome" by some cough centers) who not infrequently have a history of having failed to tolerate ace inhibitors,  dry powder inhalers or biphosphonates or report having atypical reflux symptoms that don't respond to standard doses of PPI , and are easily confused as  having aecopd or asthma flares by even experienced allergists/ pulmonologists.   The first step is to maximize acid suppression and eliminate cyclical coughing then regroup  In 2 weeks  See instructions for specific recommendations which were reviewed directly with the patient who was given a copy with highlighter outlining the key components.

## 2015-03-19 ENCOUNTER — Encounter: Payer: Self-pay | Admitting: Physical Medicine & Rehabilitation

## 2015-03-27 ENCOUNTER — Other Ambulatory Visit (INDEPENDENT_AMBULATORY_CARE_PROVIDER_SITE_OTHER): Payer: 59

## 2015-03-27 ENCOUNTER — Other Ambulatory Visit: Payer: Self-pay

## 2015-03-27 ENCOUNTER — Encounter: Payer: Self-pay | Admitting: Internal Medicine

## 2015-03-27 ENCOUNTER — Ambulatory Visit (INDEPENDENT_AMBULATORY_CARE_PROVIDER_SITE_OTHER): Payer: 59 | Admitting: Internal Medicine

## 2015-03-27 VITALS — BP 126/82 | HR 86 | Ht 63.0 in | Wt 209.8 lb

## 2015-03-27 DIAGNOSIS — R058 Other specified cough: Secondary | ICD-10-CM

## 2015-03-27 DIAGNOSIS — R05 Cough: Secondary | ICD-10-CM

## 2015-03-27 LAB — CBC WITH DIFFERENTIAL/PLATELET
Basophils Absolute: 0.1 10*3/uL (ref 0.0–0.1)
Basophils Relative: 1 % (ref 0.0–3.0)
Eosinophils Absolute: 0.1 10*3/uL (ref 0.0–0.7)
Eosinophils Relative: 1.3 % (ref 0.0–5.0)
HCT: 25.6 % — ABNORMAL LOW (ref 36.0–46.0)
Hemoglobin: 7.9 g/dL — CL (ref 12.0–15.0)
LYMPHS ABS: 3.6 10*3/uL (ref 0.7–4.0)
Lymphocytes Relative: 43.8 % (ref 12.0–46.0)
MCHC: 30.7 g/dL (ref 30.0–36.0)
MCV: 61.5 fl — ABNORMAL LOW (ref 78.0–100.0)
MONO ABS: 0.4 10*3/uL (ref 0.1–1.0)
Monocytes Relative: 5.4 % (ref 3.0–12.0)
NEUTROS ABS: 4 10*3/uL (ref 1.4–7.7)
Neutrophils Relative %: 48.5 % (ref 43.0–77.0)
PLATELETS: 353 10*3/uL (ref 150.0–400.0)
RBC: 4.16 Mil/uL (ref 3.87–5.11)
RDW: 20.7 % — AB (ref 11.5–15.5)
WBC: 8.3 10*3/uL (ref 4.0–10.5)

## 2015-03-27 MED ORDER — PREDNISONE 10 MG PO TABS: ORAL_TABLET | ORAL | Status: DC

## 2015-03-27 NOTE — Progress Notes (Signed)
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 Pulmonary Consult    Referred by Dr. Lorayne Marek.  Pt c/o cough off and on since Oct 2015.  Cough is non prod and sometimes coughs until she vomits. Cough seems worse when she lies down.  She has albuterol inhaler but has not needed to use recently.   last episode of severe daytime cough  was sev weeks prior to OV  rx with abx and steroids Has not been able to sleep flat x 6 months even with abx / prednisone / dry hacking cough  rec Reglan 10 mg take 30 min before every meal and at bedtime until you run out of the prescription  Pantoprazole (protonix) 40 mg   Take 30-60 min before first meal of the day and Pepcid 20 mg one bedtime until return to office   For cough take delsym up to 2 tsp every 12 hours as needed  Take chlortrimeton (chlorpheniramine) 4 mg x 2 at bedtime   available over the counter (may cause drowsiness)  GERD diet    03/27/2015 f/u ov/[name] re: chronic cough x 6 m, quit smoking x 6 weeks Chief Complaint  Patient presents with  . Follow-up    Cough has been worse for the past wk- prod with clear sputum. She has been using her albuterol once per day on average.    not using h1, no candy on hand  Has neurontin but not using consistently - supposed to be on 100 tid for back problems       Kouffman Reflux v Neurogenic Cough Differentiator Reflux  Comments  Do you awaken from a sound sleep coughing violently?                            With trouble breathing? Yes   Do you have choking episodes when you cannot  Get enough air, gasping for air ?              Yes   Do you usually cough when you lie down into  The bed, or when you just lie down to rest ?                          Yes   Do you usually cough after meals or eating?         no   Do you cough when (or after) you bend over?    no   GERD SCORE     Kouffman Reflux v Neurogenic Cough Differentiator Neurogenic   Do you more-or-less cough all day long? Off and on   Does change of temperature make you cough? Heat chokes   Does laughing or chuckling cause you to cough? no   Do fumes (perfume, automobile fumes, burned  Toast, etc.,) cause you to cough ?      no   Does speaking, singing, or talking on the phone cause you to cough   ?               No    Neurogenic/Airway score                No obvious pattern day to day or daytime variabilty or assoc sob  or cp or chest tightness, subjective wheeze overt sinus or hb symptoms. No unusual exp hx or h/o childhood pna/ asthma or knowledge of premature birth.  Sleeping ok without nocturnal  or early am exacerbation  of respiratory  c/o's or need for noct saba. Also denies any obvious fluctuation of symptoms with weather or environmental changes or other aggravating or alleviating factors except as outlined above   Current Medications, Allergies, Complete Past Medical History, Past Surgical History, Family History, and Social History were reviewed in [employer] record.  ROS  The following are not active complaints unless bolded sore throat, dysphagia, dental problems, itching, sneezing,  nasal congestion or excess/ purulent secretions, ear ache,   fever, chills, sweats, unintended wt loss, pleuritic or exertional cp, hemoptysis,  orthopnea pnd or leg swelling, presyncope, palpitations, heartburn, abdominal pain,  anorexia, nausea, vomiting, diarrhea  or change in bowel or urinary habits, change in stools or urine, dysuria,hematuria,  rash, arthralgias, visual complaints, headache, numbness weakness or ataxia or problems with walking or coordination,  change in mood/affect or memory.               Objective:  Physical Exam  amb bf nad harsh barking upper airway cough  03/27/2015        210  Wt Readings from Last 3 Encounters:  03/11/15 209 lb 9.6 oz (95.074 kg)  02/21/15 207 lb (93.895 kg)  01/16/15 209 lb (94.802 kg)    Vital signs reviewed   HEENT: nl dentition, turbinates, and orophanx. Nl external ear canals without cough reflex   NECK :  without JVD/Nodes/TM/ nl carotid upstrokes bilaterally   LUNGS: no acc muscle use,  Completely clear to A and P bilaterally without cough on insp or exp maneuvers   CV:  RRR  no s3 or murmur or increase in P2, no edema   ABD:  soft and nontender with nl excursion in the supine position. No bruits or organomegaly, bowel sounds nl  MS:  warm without deformities, calf tenderness, cyanosis or clubbing  SKIN: warm and dry without lesions    NEURO:  alert, approp, no deficits    CXR PA and Lateral:   03/11/2015 :     I personally reviewed images and agree with radiology impression as follows:   Chronic bronchitic changes. No pneumonia nor other acute abnormality.      Assessment & Plan:

## 2015-03-27 NOTE — Assessment & Plan Note (Addendum)
-   onset 08/2014 while smoking > d/c'd 02/07/15  - GERD rx 03/11/2015 >>>  - allergy profile 03/27/2015 >>>  - sinus ct 03/27/2015 >   I had an extended discussion with the patient reviewing all relevant studies completed to date and  lasting 15 to 20 minutes of a 25 minute visit on the following ongoing concerns:   1) The standardized cough guidelines published in Chest by Lissa Morales in 2006 are still the best available and consist of a multiple step process (up to 12!) , not a single office visit,  and are intended  to address this problem logically,  with an alogrithm dependent on response to empiric treatment at  each progressive step  to determine a specific diagnosis with  minimal addtional testing needed. Therefore if adherence is an issue or can't be accurately verified,  it's very unlikely the standard evaluation and treatment will be successful here.    Furthermore, response to therapy (other than acute cough suppression, which should only be used short term with avoidance of narcotic containing cough syrups if possible), can be a gradual process for which the patient may perceive immediate benefit.  Unlike going to an eye doctor where the best perscription is almost always the first one and is immediately effective, this is almost never the case in the management of chronic cough syndromes. Therefore the patient needs to commit up front to consistently adhere to recommendations  for up to 6 weeks of therapy directed at the likely underlying problem(s) before the response can be reasonably evaluated.   2) next step is rx for irritable larynx since can't afford to take days off for heavy cough suppression/ elimination of cyclical cough >> already has neurotni but not using and need to push up to 300 mg tid if tol while using 6 days of prednisone for the inflammation assoc with cyclical cough/ eos syndromes and w/u for allergy / sinus dz before returns for complete med reconciliation  3)  To keep  things simple, I have asked the patient to first separate medicines that are perceived as maintenance, that is to be taken daily "no matter what", from those medicines that are taken on only on an as-needed basis and I have given the patient examples of both, and then return to see our NP to generate a  detailed  medication calendar which should be followed until the next physician sees the patient and updates it.    4) Each maintenance medication was reviewed in detail including most importantly the difference between maintenance and as needed and under what circumstances the prns are to be used.  Please see instructions for details which were reviewed in writing and the patient given a copy.

## 2015-03-27 NOTE — Patient Instructions (Addendum)
Stop metaclopramide  Start neurontin (gapentin) 100 mg three times daily with meals and still coughing build it up to 300 mg three times a day   Pantoprazole (protonix) 40 mg   Take 30-60 min before first meal of the day and Pepcid 20 mg one bedtime until return to office    For cough take delsym up to 2 tsp every 12 hours as needed   Take chlortrimeton (chlorpheniramine) 4 mg x 2 at bedtime   available over the counter (may cause drowsiness)   GERD (REFLUX)  is an extremely common cause of respiratory symptoms just like yours , many times with no obvious heartburn at all.    It can be treated with medication, but also with lifestyle changes including avoidance of late meals, excessive alcohol, smoking cessation, and avoid fatty foods, chocolate, peppermint, colas, red wine, and acidic juices such as orange juice.  NO MINT OR MENTHOL PRODUCTS SO NO COUGH DROPS  USE SUGARLESS CANDY INSTEAD (Jolley ranchers or Stover's or Life Savers) or even ice chips will also do - the key is to swallow to prevent all throat clearing.  Prednisone 10 mg take  4 each am x 2 days,   2 each am x 2 days,  1 each am x 2 days and stop    See Tammy NP w/in 2 weeks with all your medications, even over the counter meds, separated in two separate bags, the ones you take no matter what vs the ones you stop once you feel better and take only as needed when you feel you need them.   Tammy  will generate for you a new user friendly medication calendar that will put [place] all on the same page re: your medication use.     Without this process, it simply isn't possible to assure that we are providing  your outpatient care  with  the attention to detail we feel you deserve.   If we cannot assure that you're getting that kind of care,  then we cannot manage your problem effectively from this clinic.  Once you have seen Tammy and we are sure that we're all on the same page with your medication use she will arrange follow up with me.    Please see patient coordinator before you leave today  to schedule sinus CT   Please remember to go to the lab   department downstairs for your tests - we will call you with the results when they are available.

## 2015-03-28 ENCOUNTER — Telehealth: Payer: Self-pay | Admitting: Internal Medicine

## 2015-03-28 MED ORDER — POLYSACCHARIDE IRON COMPLEX 150 MG PO CAPS: 150.0000 mg | ORAL_CAPSULE | Freq: Every day | ORAL | Status: DC

## 2015-03-28 NOTE — Telephone Encounter (Signed)
Call patient : Study is c/w iron def anemia    rec NuIron 150 mg daily if not already on rx and regroup in 2 weeks with tammy as planned for further eval and rx of this problem   Spoke with pt, aware of results/recs.  Verified appt with TP.  Iron tabs called in to preferred pharmacy.  Nothing further needed.

## 2015-03-31 LAB — ALLERGY FULL PROFILE
Allergen, D pternoyssinus,d7: 0.13 kU/L — ABNORMAL HIGH
Allergen,Goose feathers, e70: <0.1 kU/L
Alternaria Alternata: <0.1 kU/L
Aspergillus fumigatus, m3: <0.1 kU/L
Common Ragweed: <0.1 kU/L
D. farinae: <0.1 kU/L
Dog Dander: <0.1 kU/L
Helminthosporium halodes: <0.1 kU/L
House Dust Hollister: <0.1 kU/L
IgE (Immunoglobulin E), Serum: 112 kU/L (ref <115)
Lamb's Quarters: <0.1 kU/L
Oak: <0.1 kU/L
Plantain: <0.1 kU/L
Stemphylium Botryosum: <0.1 kU/L
Sycamore Tree: <0.1 kU/L
Timothy Grass: <0.1 kU/L

## 2015-04-02 ENCOUNTER — Inpatient Hospital Stay: Admission: RE | Admit: 2015-04-02 | Payer: 59 | Source: Ambulatory Visit

## 2015-04-04 ENCOUNTER — Other Ambulatory Visit: Payer: Self-pay | Admitting: Physical Medicine & Rehabilitation

## 2015-04-04 ENCOUNTER — Ambulatory Visit (HOSPITAL_BASED_OUTPATIENT_CLINIC_OR_DEPARTMENT_OTHER): Payer: 59 | Admitting: Physical Medicine & Rehabilitation

## 2015-04-04 ENCOUNTER — Encounter: Payer: 59 | Attending: Physical Medicine & Rehabilitation

## 2015-04-04 ENCOUNTER — Encounter: Payer: Self-pay | Admitting: Physical Medicine & Rehabilitation

## 2015-04-04 VITALS — BP 119/75 | HR 107 | Resp 14

## 2015-04-04 DIAGNOSIS — M222X1 Patellofemoral disorders, right knee: Secondary | ICD-10-CM | POA: Insufficient documentation

## 2015-04-04 DIAGNOSIS — Z79899 Other long term (current) drug therapy: Secondary | ICD-10-CM | POA: Diagnosis not present

## 2015-04-04 DIAGNOSIS — G894 Chronic pain syndrome: Secondary | ICD-10-CM | POA: Insufficient documentation

## 2015-04-04 DIAGNOSIS — Z5181 Encounter for therapeutic drug level monitoring: Secondary | ICD-10-CM

## 2015-04-04 DIAGNOSIS — M222X9 Patellofemoral disorders, unspecified knee: Secondary | ICD-10-CM | POA: Insufficient documentation

## 2015-04-04 MED ORDER — DICLOFENAC SODIUM 1 % TD GEL: 2.0000 g | Freq: Four times a day (QID) | TRANSDERMAL | Status: DC

## 2015-04-04 NOTE — Patient Instructions (Signed)
Patellofemoral Syndrome If you have had pain in the front of your knee for a long time, chances are good that you have patellofemoral syndrome. The word patella refers to the kneecap. Femoral (or femur) refers to the thigh bone. That is the bone the kneecap sits on. The kneecap is shaped like a triangle. Its job is to protect the knee and to improve the efficiency of your thigh muscles (quadriceps). The underside of the kneecap is made of smooth tissue (cartilage). This lets the kneecap slide up and down as the knee moves. Sometimes this cartilage becomes soft. Your healthcare provider may say the cartilage breaks down. That is patellofemoral syndrome. It can affect one knee, or both. The condition is sometimes called patellofemoral pain syndrome. That is because the condition is painful. The pain usually gets worse with activity. Sitting for a long time with the knee bent also makes the pain worse. It usually gets better with rest and proper treatment. CAUSES  No one is sure why some people develop this problem and others do not. Runners often get it. One name for the condition is "runner's knee." However, some people run for years and never have knee pain. Certain things seem to make patellofemoral syndrome more likely. They include:  Moving out of alignment. The kneecap is supposed to move in a straight line when the thigh muscle pulls on it. Sometimes the kneecap moves in poor alignment. That can make the knee swell and hurt. Some experts believe it also wears down the cartilage.  Injury to the kneecap.  Strain on the knee. This may occur during sports activity. Soccer, running, skiing and cycling can put excess stress on the knee.  Being flat-footed or knock-kneed. SYMPTOMS   Knee pain.  Pain under the kneecap. This is usually a dull, aching pain.  Pain in the knee when doing certain things: squatting, kneeling, going up or down stairs.  Pain in the knee when you stand up after sitting down  for awhile.  Tightness in the knee.  Loss of muscle strength in the thigh.  Swelling of the knee. DIAGNOSIS  Healthcare providers often send people with knee pain to an orthopedic caregiver. This person has special training to treat problems with bones and joints. To decide what is causing your knee pain, your caregiver will probably:  Do a physical exam. This will probably include:  Asking about symptoms you have noticed.  Asking about your activities and any injuries.  Feeling your knee. Moving it. This will help test the knee's strength. It will also check alignment (whether the knee and leg are aligned normally).  Order some tests, such as:  Imaging tests. They create pictures of the inside of the knee. Tests may include:  X-rays.  Computed tomography (CT) scan. This uses X-rays and a computer to show more detail.  Magnetic resonance imaging (MRI). This test uses magnets, radio waves and a computer to make pictures. TREATMENT   Medication is almost always used first. It can relieve pain. It also can reduce swelling. Non-steroidal anti-inflammatory medicines (called NSAIDs) are usually suggested. Sometimes a stronger form is needed. A stronger form would require a prescription.  Other treatment may be needed after the swelling goes down. Possibilities include:  Exercise. Certain exercises can make the muscles around the knee stronger which decreases the pressure on the knee cap. This includes the thigh muscle. Certain exercises also may be suggested to increase your flexibility.  A knee brace. This gives the knee extra support  and helps align the movement of the knee cap.  Orthotics. These are special shoe inserts. They can help keep your leg and knee aligned.  Surgery is sometimes needed. This is rare. Options include:  Arthroscopy. The surgeon uses a special tool to remove any damaged pieces of the kneecap. Only a few small incisions (cuts) are needed.  Realignment.  This is open surgery. The goals are to reduce pressure and fix the way the kneecap moves. HOME CARE INSTRUCTIONS   Take any medication prescribed by your healthcare provider. Follow the directions carefully.  If your knee is swollen:  Put ice or cold packs on it. Do this for 20 to 30 minutes, 3 to 4 times a day.  Keep the knee raised. Make sure it is supported. Put a pillow under it.  Rest your knee. For example, take the elevator instead of the stairs for awhile. Or, take a break from sports activity that strain your knee. Try walking or swimming instead.  Whenever you are active:  Use an elastic bandage on your knee. This gives it support.  After any activity, put ice or cold packs on your knees. Do this for about 10 to 20 minutes.  Make sure you wear shoes that give good support. Make sure they are not worn down. The heels should not slant in or out. SEEK MEDICAL CARE IF:   Knee pain gets worse. Or it does not go away, even after taking pain medicine.  Swelling does not go down.  Your thigh muscle becomes weak.  You have an oral temperature above 102 F (38.9 C). SEEK IMMEDIATE MEDICAL CARE IF:  You have an oral temperature above 102 F (38.9 C), not controlled by medicine. Document Released: 10/13/2009 Document Revised: 01/17/2012 Document Reviewed: 01/14/2014 [facility] Patient Information 2015 [place], [place]. This information is not intended to replace advice given to you by your health care provider. Make sure you discuss any questions you have with your health care provider.

## 2015-04-04 NOTE — Progress Notes (Signed)
Subjective:    Patient ID: [name], female    DOB: 25-Aug-1971, 44 y.o.   MRN: [id number] Chief complaint right knee pain HPI Patient with 10 month history of right knee pain. No trauma to that area. Was evaluated by orthopedics, knee x-rays showed no significant arthritis. Patient had knee arthroscopy, had a tear that was "cleaned up".Preoperatively he had cortisone injection which were not helpful. Patient did have fusion of the right knee requiring arthrocentesis. Patient had persistent postoperative pain. Underwent Synvisc injection without much benefit.  Has not tried any diclofenac gel Patient tried some physical therapy before the Synvisc injection for about 2 weeks but it was not helpful.  Patient has pain mainly medial anterior aspect of knee mainly below knee. Pain increases when she is been sitting for a while and then gets up to walk. Occasional burning sensation back knee when she's been walking too long  Patient gets occasional low back pain she thinks it may be related to putting less weight on the right leg Patient also uses a right knee brace mainly at work but does not find it to be very helpful    Pain Inventory Average Pain 6 Pain Right Now 6 My pain is aching  In the last 24 hours, has pain interfered with the following? General activity 7 Relation with others 7 Enjoyment of life 6 What TIME of day is your pain at its worst? morning Sleep (in general) Fair  Pain is worse with: walking and standing Pain improves with: rest and medication Relief from Meds: 3  Mobility walk without assistance how many minutes can you walk? 10 ability to climb steps?  yes do you drive?  yes transfers alone Do you have any goals in this area?  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  yes  Neuro/Psych numbness trouble walking  Prior Studies Any changes since last visit?  no  Physicians involved in  your care Any changes since last visit?  no   Family History  Problem Relation Age of Onset  . Renal Disease Father     dialysis  . Hypertension Father   . Heart disease Maternal Grandfather   . Asthma Sister   . Asthma Maternal Aunt   . Heart disease Mother    History   Social History  . Marital Status: Divorced    Spouse Name: N/A  . Number of Children: 7  . Years of Education: GEd   Occupational History  . CNA    Social History Main Topics  . Smoking status: Former Smoker -- 0.50 packs/day for 26 years    Types: Cigarettes    Quit date: 02/07/2015  . Smokeless tobacco: Never Used  . Alcohol Use: No  . Drug Use: No  . Sexual Activity: Not on file   Other Topics Concern  . None   Social History Narrative   Patient lives at home with her family    Patient drinks soda   Past Surgical History  Procedure Laterality Date  . Cesarean section      x 1 with 5th pregnancy  . Cholecystectomy    . Tee without cardioversion N/A 05/21/2013    Procedure: TRANSESOPHAGEAL ECHOCARDIOGRAM (TEE);  Surgeon: Sueanne Margarita, MD;  Location: [facility] ENDOSCOPY;  Service: Cardiovascular;  Laterality: N/A;  . Tubal ligation    . Anterior cervical decomp/discectomy fusion N/A 03/06/2014    Procedure: ANTERIOR CERVICAL  DECOMPRESSION/DISCECTOMY FUSION 2 LEVELS four/five, five/six;  Surgeon: Eustace Moore, MD;  Location: [facility] NEURO ORS;  Service: Neurosurgery;  Laterality: N/A;   Past Medical History  Diagnosis Date  . Stroke 05/2013  . History of TIAs   . Pneumonia   . Headache(784.0)   . Numbness     Right hand  . Anemia   . History of blood transfusion     Hx; of in 1991 after delivery   BP 119/75 mmHg  Pulse 107  Resp 14  SpO2 97%  LMP 02/25/2015  Opioid Risk Score: 2 Fall Risk Score:  `1  Depression screen PHQ 2/9  Depression screen [facility] 2/9 04/04/2015 02/21/2015 08/30/2013 05/28/2013  Decreased Interest 1 0 0 0  Down, Depressed, Hopeless 0 0 0 0  PHQ - 2 Score 1 0 0 0    Altered sleeping 1 - - -  Tired, decreased energy 1 - - -  Change in appetite 0 - - -  Feeling bad or failure about yourself  0 - - -  Trouble concentrating 0 - - -  Moving slowly or fidgety/restless 1 - - -  Suicidal thoughts 0 - - -  PHQ-9 Score 4 - - -     Review of Systems  Respiratory: Positive for cough.   Musculoskeletal: Positive for gait problem.  Neurological: Positive for numbness.  All other systems reviewed and are negative.      Objective:   Physical Exam  Constitutional: She is oriented to person, place, and time. She appears well-developed.  overwt  Neurological: She is alert and oriented to person, place, and time.  Psychiatric: She has a normal mood and affect.  Nursing note and vitals reviewed.   Pain with knee flexion but has good range of motion No effusion noted bilaterally Mild right medial joint line tenderness No pain with valgus stress of the left or right knee. Crepitus right knee with flexion and extension Mild tenderness over the anserine bursa at area No knee instability with medial or lateral testing. Negative anterior drawer test right knee No sensory deficits over the anterior medial lateral posterior or inferior knee  Deep tendon reflexes 2+ bilateral patellar and Achilles  Lumbar spine range of motion 100% flexion extension lateral bending and lateral rotation No tenderness to palpation along the lumbar paraspinal muscles. 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 She has no signs of active inflammation or effusion. Her examination and symptoms are most consistent with Patellofemoral syndrome We discussed treatment including diclofenac gel 4 times a day to the right knee Stationary bicycling with seat raised to avoid excessive knee flexion Weight loss Do not advise Chronic narcotic analgesics for  this diagnosis  Went over the diagnosis as well as the local anatomy. Follow up with physical medicine rehabilitation on when necessary basis  This does not appear to be related to lumbar spine issues or radiculopathy , Do not recommend  lumbar imaging studies

## 2015-04-05 LAB — PMP ALCOHOL METABOLITE (ETG): Ethyl Glucuronide (EtG): NEGATIVE ng/mL

## 2015-04-09 LAB — PRESCRIPTION MONITORING PROFILE (SOLSTAS)
AMPHETAMINE/METH: NEGATIVE ng/mL
Barbiturate Screen, Urine: NEGATIVE ng/mL
Benzodiazepine Screen, Urine: NEGATIVE ng/mL
Buprenorphine, Urine: NEGATIVE ng/mL
CANNABINOID SCRN UR: NEGATIVE ng/mL
CARISOPRODOL, URINE: NEGATIVE ng/mL
COCAINE METABOLITES: NEGATIVE ng/mL
CREATININE, URINE: 70.35 mg/dL (ref >20.0)
Fentanyl, Ur: NEGATIVE ng/mL
MDMA URINE: NEGATIVE ng/mL
MEPERIDINE UR: NEGATIVE ng/mL
METHADONE SCREEN, URINE: NEGATIVE ng/mL
NITRITES URINE, INITIAL: NEGATIVE ug/mL
OPIATE SCREEN, URINE: NEGATIVE ng/mL
Oxycodone Screen, Ur: NEGATIVE ng/mL
PH URINE, INITIAL: 5.3 pH (ref 4.5–8.9)
PROPOXYPHENE: NEGATIVE ng/mL
Tapentadol, urine: NEGATIVE ng/mL
Tramadol Scrn, Ur: NEGATIVE ng/mL
ZOLPIDEM, URINE: NEGATIVE ng/mL

## 2015-04-10 ENCOUNTER — Ambulatory Visit (INDEPENDENT_AMBULATORY_CARE_PROVIDER_SITE_OTHER)
Admission: RE | Admit: 2015-04-10 | Discharge: 2015-04-10 | Disposition: A | Discharge status: Home / Self Care | Payer: 59 | Source: Ambulatory Visit | Attending: Internal Medicine | Admitting: Internal Medicine

## 2015-04-10 DIAGNOSIS — R05 Cough: Secondary | ICD-10-CM

## 2015-04-10 DIAGNOSIS — R058 Other specified cough: Secondary | ICD-10-CM

## 2015-04-10 NOTE — Progress Notes (Signed)
Quick Note:  LMTCB ______ 

## 2015-04-14 ENCOUNTER — Inpatient Hospital Stay: Admission: RE | Admit: 2015-04-14 | Payer: 59 | Source: Ambulatory Visit

## 2015-04-14 ENCOUNTER — Encounter: Payer: Self-pay | Admitting: Adult Health

## 2015-04-14 ENCOUNTER — Ambulatory Visit (INDEPENDENT_AMBULATORY_CARE_PROVIDER_SITE_OTHER): Payer: 59 | Admitting: Adult Health

## 2015-04-14 VITALS — BP 126/78 | HR 89 | Temp 98.9°F | Ht 64.0 in | Wt 208.9 lb

## 2015-04-14 DIAGNOSIS — R058 Other specified cough: Secondary | ICD-10-CM

## 2015-04-14 DIAGNOSIS — R05 Cough: Secondary | ICD-10-CM

## 2015-04-14 NOTE — Patient Instructions (Signed)
Follow med calendar closely and bring to each visit Get Delsym to help with cough .  Try not to throat clear, use sips of water to soothe throat.  Follow up Dr. Melvyn Novas  In 2 months with PFT and As needed   Please contact office for sooner follow up if symptoms do not improve or worsen or seek emergency care

## 2015-04-14 NOTE — Progress Notes (Signed)
Chart and office note reviewed, agree with a/p

## 2015-04-14 NOTE — Assessment & Plan Note (Addendum)
UACS - with suspect triggers of AR/GERD  iimproving with neurotnin  Needs to start delsym  Needs PFT as former smoker  Workup with neg cxr -ct sinus   - allergy profile 03/27/2015 >  IgE 112 RAST mild POS RAST for dust,  Eos 0.1  -   Plan  Follow med calendar closely and bring to each visit Get Delsym to help with cough .  Try not to throat clear, use sips of water to soothe throat.  Follow up Dr. Melvyn Novas  In 2 months with PFT and As needed   Please contact office for sooner follow up if symptoms do not improve or worsen or seek emergency care

## 2015-04-14 NOTE — Progress Notes (Signed)
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 Pulmonary Consult    Referred by Dr. Lorayne Marek.  Pt c/o cough off and on since Oct 2015.  Cough is non prod and sometimes coughs until she vomits. Cough seems worse when she lies down.  She has albuterol inhaler but has not needed to use recently.   last episode of severe daytime cough  was sev weeks prior to OV  rx with abx and steroids Has not been able to sleep flat x 6 months even with abx / prednisone / dry hacking cough  rec Reglan 10 mg take 30 min before every meal and at bedtime until you run out of the prescription  Pantoprazole (protonix) 40 mg   Take 30-60 min before first meal of the day and Pepcid 20 mg one bedtime until return to office   For cough take delsym up to 2 tsp every 12 hours as needed  Take chlortrimeton (chlorpheniramine) 4 mg x 2 at bedtime   available over the counter (may cause drowsiness)  GERD diet    03/27/2015 f/u ov/[facility] re: chronic cough x 6 m, quit smoking x 6 weeks Chief Complaint  Patient presents with  . Follow-up    Cough has been worse for the past wk- prod with clear sputum. She has been using her albuterol once per day on average.    not using h1, no candy on hand  Has neurontin but not using consistently - supposed to be on 100 tid for back problems       Kouffman Reflux v Neurogenic Cough Differentiator Reflux  Comments  Do you awaken from a sound sleep coughing violently?                            With trouble breathing? Yes   Do you have choking episodes when you cannot  Get enough air, gasping for air ?              Yes   Do you usually cough when you lie down into  The bed, or when you just lie down to rest ?                          Yes   Do you usually cough after meals or eating?         no   Do you cough when (or after) you bend over?    no   GERD SCORE     Kouffman Reflux v Neurogenic Cough Differentiator Neurogenic   Do you more-or-less cough all day long? Off and on   Does change of temperature make you cough? Heat chokes   Does laughing or chuckling cause you to cough? no   Do fumes (perfume, automobile fumes, burned  Toast, etc.,) cause you to cough ?      no   Does speaking, singing, or talking on the phone cause you to cough   ?               No    Neurogenic/Airway score      >>d/c reglan and rx neurontin    04/14/2015 Follow up : Cough and Med Calendar  Pt returns for 2 week follow up .  We reviewed all her meds and organized them into a med calendar with pt education.  Has not been able to get delsym yet.  Last ov reglan was stopped and restart on neurontin.  She remained on neurontin 100mg  Three times a day  .  Does feel cough is some better ~50%.  Recent cxr showed bronchitic changes  CT sinus neg for acute process Denies chest pain, orthopnea, edema or fever.     Current Medications, Allergies, Complete Past Medical History, Past Surgical History, Family History, and Social History were reviewed in [employer] record.  ROS  The following are not active complaints unless bolded sore throat, dysphagia, dental problems, itching, sneezing,  nasal congestion or excess/ purulent secretions, ear ache,   fever, chills, sweats, unintended wt loss, pleuritic or exertional cp, hemoptysis,  orthopnea pnd or leg swelling, presyncope, palpitations,  heartburn, abdominal pain, anorexia, nausea, vomiting, diarrhea  or change in bowel or urinary habits, change in stools or urine, dysuria,hematuria,  rash, arthralgias, visual complaints, headache, numbness weakness or ataxia or problems with walking or coordination,  change in mood/affect or memory.               Objective:  Physical Exam   03/27/2015        210 >208 04/14/2015   Vital signs reviewed   HEENT: nl dentition, turbinates, and orophanx. Nl external ear canals without cough reflex   NECK :  without JVD/Nodes/TM/ nl carotid upstrokes bilaterally   LUNGS: no acc muscle use,  Completely clear to A and P bilaterally without cough on insp or exp maneuvers   CV:  RRR  no s3 or murmur or increase in P2, no edema   ABD:  soft and nontender with nl excursion in the supine position. No bruits or organomegaly, bowel sounds nl  MS:  warm without deformities, calf tenderness, cyanosis or clubbing  SKIN: warm and dry without lesions    NEURO:  alert, approp, no deficits    CXR PA and Lateral:   03/11/2015 :      Chronic bronchitic changes. No pneumonia nor other acute abnormality.      Assessment & Plan:

## 2015-04-16 NOTE — Progress Notes (Signed)
Urine drug screen for this encounter is consistent for having no prescribed medication

## 2015-04-22 NOTE — Addendum Note (Signed)
Addended by: Inge Rise on: 04/22/2015 03:47 PM   Modules accepted: Orders, Medications

## 2015-04-23 ENCOUNTER — Telehealth: Payer: Self-pay | Admitting: *Deleted

## 2015-04-23 NOTE — Telephone Encounter (Signed)
Received letter from [employer] stating that Voltaren Gel is not covered unless step one drug has been tried.  Hx shows that naproxen and meloxicam have been prescribed. Their claims data showed that the Rx was not filled. In case of prior auth request she should meet criteria. Letter will be sent to media file.

## 2015-04-29 ENCOUNTER — Ambulatory Visit: Payer: 59 | Admitting: Physical Medicine & Rehabilitation

## 2015-06-16 ENCOUNTER — Ambulatory Visit: Payer: 59 | Admitting: Internal Medicine

## 2015-08-22 ENCOUNTER — Telehealth: Payer: Self-pay | Admitting: Internal Medicine

## 2015-08-22 NOTE — Telephone Encounter (Signed)
Patient called stating that she has a rash on left side of arm and stomach going down leg. She wont be able to be seen until after next week. Please follow up with patient with any advise. Thank you.

## 2015-08-25 ENCOUNTER — Encounter (HOSPITAL_COMMUNITY): Payer: Self-pay | Admitting: Emergency Medicine

## 2015-08-25 ENCOUNTER — Emergency Department (HOSPITAL_COMMUNITY)
Admission: EM | Admit: 2015-08-25 | Discharge: 2015-08-25 | Disposition: A | Discharge status: Home / Self Care | Payer: 59 | Attending: Emergency Medicine | Admitting: Emergency Medicine

## 2015-08-25 DIAGNOSIS — Z91040 Latex allergy status: Secondary | ICD-10-CM | POA: Insufficient documentation

## 2015-08-25 DIAGNOSIS — Z8673 Personal history of transient ischemic attack (TIA), and cerebral infarction without residual deficits: Secondary | ICD-10-CM | POA: Insufficient documentation

## 2015-08-25 DIAGNOSIS — B86 Scabies: Secondary | ICD-10-CM | POA: Insufficient documentation

## 2015-08-25 DIAGNOSIS — D649 Anemia, unspecified: Secondary | ICD-10-CM | POA: Insufficient documentation

## 2015-08-25 DIAGNOSIS — Z87891 Personal history of nicotine dependence: Secondary | ICD-10-CM | POA: Insufficient documentation

## 2015-08-25 DIAGNOSIS — Z8701 Personal history of pneumonia (recurrent): Secondary | ICD-10-CM | POA: Insufficient documentation

## 2015-08-25 DIAGNOSIS — Z79899 Other long term (current) drug therapy: Secondary | ICD-10-CM | POA: Insufficient documentation

## 2015-08-25 MED ORDER — IVERMECTIN 3 MG PO TABS: 200.0000 ug/kg | ORAL_TABLET | Freq: Once | ORAL | Status: DC

## 2015-08-25 MED ORDER — PERMETHRIN 5 % EX CREA: 2.0000 "application " | TOPICAL_CREAM | Freq: Once | CUTANEOUS | Status: DC

## 2015-08-25 MED ORDER — DIPHENHYDRAMINE HCL 25 MG PO TABS: 25.0000 mg | ORAL_TABLET | Freq: Four times a day (QID) | ORAL | Status: DC

## 2015-08-25 NOTE — ED Notes (Signed)
Declined W/C at D/C and was escorted to lobby by RN. 

## 2015-08-25 NOTE — ED Notes (Signed)
Patient states works at [employer].  Patient states they have several patients there with scabies that aren't being treated.   Patient now has rash to bilateral arms, hands, abdomen and back.   Patient states it is itching.

## 2015-08-25 NOTE — Discharge Instructions (Signed)
Scabies, Adult Follow-up with your primary care physician. Take medications as prescribed. Scabies is a skin condition that happens when very small insects get under the skin (infestation). This causes a rash and severe itchiness. Scabies can spread from person to person (is contagious). If you get scabies, it is common for others in your household to get scabies too. With proper treatment, symptoms usually go away in 2-4 weeks. Scabies usually does not cause lasting problems. CAUSES This condition is caused by mites (Sarcoptes scabiei, or human itch mites) that can only be seen with a microscope. The mites get into the top layer of skin and lay eggs. Scabies can spread from person to person through:  Close contact with a person who has scabies.  Contact with infested items, such as towels, bedding, or clothing. RISK FACTORS This condition is more likely to develop in:  People who live in nursing homes and other extended-care facilities.  People who have sexual contact with a partner who has scabies.  Young children who attend child care facilities.  People who care for others who are at increased risk for scabies. SYMPTOMS Symptoms of this condition may include:  Severe itchiness. This is often worse at night.  A rash that includes tiny red bumps or blisters. The rash commonly occurs on the wrist, elbow, armpit, fingers, waist, groin, or buttocks. Bumps may form a line (burrow) in some areas.  Skin irritation. This can include scaly patches or sores. DIAGNOSIS This condition is diagnosed with a physical exam. Your health care provider will look closely at your skin. In some cases, your health care provider may take a sample of your affected skin (skin scraping) and have it examined under a microscope. TREATMENT This condition may be treated with:  Medicated cream or lotion that kills the mites. This is spread on the entire body and left on for several hours. Usually, one treatment  with medicated cream or lotion is enough to kill all of the mites. In severe cases, the treatment may be repeated.  Medicated cream that relieves itching.  Medicines that help to relieve itching.  Medicines that kill the mites. This treatment is rarely used. HOME CARE INSTRUCTIONS Medicines  Take or apply over-the-counter and prescription medicines as told by your health care provider.  Apply medicated cream or lotion as told by your health care provider.  Do not wash off the medicated cream or lotion until the necessary amount of time has passed. Skin Care  Avoid scratching your affected skin.  Keep your fingernails closely trimmed to reduce injury from scratching.  Take cool baths or apply cool washcloths to help reduce itching. General Instructions  Clean all items that you recently had contact with, including bedding, clothing, and furniture. Do this on the same day that your treatment starts.  Use hot water when you wash items.  Place unwashable items into closed, airtight plastic bags for at least 3 days. The mites cannot live for more than 3 days away from human skin.  Vacuum furniture and mattresses that you use.  Make sure that other people who may have been infested are examined by a health care provider. These include members of your household and anyone who may have had contact with infested items.  Keep all follow-up visits as told by your health care provider. This is important. SEEK MEDICAL CARE IF:  You have itching that does not go away after 4 weeks of treatment.  You continue to develop new bumps or burrows.  You have redness, swelling, or pain in your rash area after treatment.  You have fluid, blood, or pus coming from your rash.   This information is not intended to replace advice given to you by your health care provider. Make sure you discuss any questions you have with your health care provider.   Document Released: 07/16/2015 Document Reviewed:  05/27/2015 Elsevier Interactive Patient Education [employer].

## 2015-08-25 NOTE — ED Provider Notes (Signed)
CSN: [id number]     Arrival date & time 08/25/15  0913 History  By signing my name below, I, [name], attest that this documentation has been prepared under the direction and in the presence of Dawn Glazier, PA-C Electronically Signed: Ladene Artist, ED Scribe 08/25/2015 at 9:38 AM.   Chief Complaint  Patient presents with  . Rash   The history is provided by the patient. No language interpreter was used.   HPI Comments: [name] is a 44 y.o. female who presents to the Emergency Department complaining of a pruritic rash to bilateral arms, abdomen and back for the past week. Pt works as a [employer]ar[employer] manager and has been exposed to scabies. She has tried Hydrocortisone without significant relief. She denies fever.   Past Medical History  Diagnosis Date  . Stroke ([place]) 05/2013  . History of TIAs   . Pneumonia   . Headache(784.0)   . Numbness     Right hand  . Anemia   . History of blood transfusion     Hx; of in 1991 after delivery   Past Surgical History  Procedure Laterality Date  . Cesarean section      x 1 with 5th pregnancy  . Cholecystectomy    . Tee without cardioversion N/A 05/21/2013    Procedure: TRANSESOPHAGEAL ECHOCARDIOGRAM (TEE);  Surgeon: Sueanne Margarita, MD;  Location: [facility] ENDOSCOPY;  Service: Cardiovascular;  Laterality: N/A;  . Tubal ligation    . Anterior cervical decomp/discectomy fusion N/A 03/06/2014    Procedure: ANTERIOR CERVICAL DECOMPRESSION/DISCECTOMY FUSION 2 LEVELS four/five, five/six;  Surgeon: Eustace Moore, MD;  Location: [facility] NEURO ORS;  Service: Neurosurgery;  Laterality: N/A;   Family History  Problem Relation Age of Onset  . Renal Disease Father     dialysis  . Hypertension Father   . Heart disease Maternal Grandfather   . Asthma Sister   . Asthma Maternal Aunt   . Heart disease Mother    Social History  Substance Use Topics  . Smoking status: Former Smoker -- 0.50 packs/day for 26 years    Types: Cigarettes    Quit date: 02/07/2015  .  Smokeless tobacco: Never Used  . Alcohol Use: No   OB History    No data available     Review of Systems  Constitutional: Negative for fever.  Skin: Positive for rash.   Allergies  Shellfish allergy and Latex  Home Medications   Prior to Admission medications   Medication Sig Start Date End Date Taking? Authorizing Provider  albuterol (PROVENTIL HFA;VENTOLIN HFA) 108 (90 BASE) MCG/ACT inhaler Inhale 2 puffs into the lungs every 6 (six) hours as needed for wheezing or shortness of breath. Patient taking differently: Inhale 2 puffs into the lungs every 4 (four) hours as needed for wheezing or shortness of breath.  02/21/15   Lorayne Marek, MD  chlorpheniramine (CHLOR-TRIMETON) 4 MG tablet Take 8 mg by mouth at bedtime. May take 1 extra every 4 hrs as needed    Historical Provider, MD  dextromethorphan (DELSYM) 30 MG/5ML liquid Take 30 mg by mouth every 12 (twelve) hours as needed for cough.    Historical Provider, MD  diphenhydrAMINE (BENADRYL) 25 MG tablet Take 1 tablet (25 mg total) by mouth every 6 (six) hours. 08/25/15   Anitra Doxtater Patel-Mills, PA-C  famotidine (PEPCID) 20 MG tablet One at bedtime 03/11/15   Tanda Rockers, MD  fluticasone [facility]) 50 MCG/ACT nasal spray Place 2 sprays into both nostrils daily. Patient taking differently:  Place 2 sprays into both nostrils at bedtime.  10/28/14   Lorayne Marek, MD  gabapentin (NEURONTIN) 100 MG capsule Take 1 capsule (100 mg total) by mouth 3 (three) times daily. 01/16/15   Lorayne Marek, MD  guaiFENesin (MUCINEX) 600 MG 12 hr tablet Take 600 mg by mouth every 12 (twelve) hours as needed.    Historical Provider, MD  iron polysaccharides (NIFEREX) 150 MG capsule Take 1 capsule (150 mg total) by mouth daily. Patient taking differently: Take 150 mg by mouth 2 (two) times daily.  03/28/15   Tanda Rockers, MD  ivermectin (STROMECTOL) 3 MG TABS tablet Take 6.5 tablets (19,500 mcg total) by mouth once. Take on days 1, 2, 8, 9, and 15. 08/25/15    Aubriegh Minch Patel-Mills, PA-C  pantoprazole (PROTONIX) 40 MG tablet Take 1 tablet (40 mg total) by mouth daily. Take 30-60 min before first meal of the day 03/11/15   Tanda Rockers, MD  permethrin (ELIMITE) 5 % cream Apply 2 application topically once. Apply once today and then once in one week. 08/25/15   Tyshae Stair Patel-Mills, PA-C   BP 129/84 mmHg  Pulse 100  Temp(Src) 98 F (36.7 C) (Oral)  Resp 18  SpO2 100% Physical Exam  Constitutional: She is oriented to person, place, and time. She appears well-developed and well-nourished. No distress.  HENT:  Head: Normocephalic and atraumatic.  Eyes: Conjunctivae and EOM are normal.  Neck: Neck supple. No tracheal deviation present.  Cardiovascular: Normal rate.   Pulmonary/Chest: Effort normal. No respiratory distress.  Musculoskeletal: Normal range of motion.  Neurological: She is alert and oriented to person, place, and time.  Skin: Skin is warm and dry. Rash noted.  Small erythematous papules with excoriation and burrows. No wheals or bullae. Located on the abdomen, back, and bilateral forearms.   Psychiatric: She has a normal mood and affect. Her behavior is normal.  Nursing note and vitals reviewed.  ED Course  Procedures (including critical care time) DIAGNOSTIC STUDIES: Oxygen Saturation is 100% on RA, normal by my interpretation.    COORDINATION OF CARE: 9:30 AM-Discussed treatment plan which includes Elimite cream with pt at bedside and pt agreed to plan.   Labs Review Labs Reviewed - No data to display  Imaging Review No results found.   EKG Interpretation None      MDM   Final diagnoses:  Scabies  Patient presents for rash that appears to be scabies.  No signs of cellulitis or deep tissue infection.   Rx: permetherin, ivermectin, benadryl I discussed return precautions and follow up and she verbally agrees with the plan.   I personally performed the services described in this documentation, which was scribed in my  presence. The recorded information has been reviewed and is accurate.    Dawn Glazier, PA-C 08/25/15 1028  Elnora Morrison, MD 08/26/15 [phone]

## 2015-09-04 ENCOUNTER — Encounter: Payer: Self-pay | Admitting: Family Medicine

## 2015-09-04 ENCOUNTER — Ambulatory Visit: Payer: Self-pay | Attending: Family Medicine | Admitting: Family Medicine

## 2015-09-04 VITALS — BP 106/72 | HR 76 | Temp 98.0°F | Resp 16 | Ht 63.5 in | Wt 196.0 lb

## 2015-09-04 DIAGNOSIS — F17200 Nicotine dependence, unspecified, uncomplicated: Secondary | ICD-10-CM

## 2015-09-04 DIAGNOSIS — Z23 Encounter for immunization: Secondary | ICD-10-CM | POA: Insufficient documentation

## 2015-09-04 DIAGNOSIS — F17210 Nicotine dependence, cigarettes, uncomplicated: Secondary | ICD-10-CM | POA: Insufficient documentation

## 2015-09-04 DIAGNOSIS — K219 Gastro-esophageal reflux disease without esophagitis: Secondary | ICD-10-CM | POA: Insufficient documentation

## 2015-09-04 DIAGNOSIS — D649 Anemia, unspecified: Secondary | ICD-10-CM | POA: Insufficient documentation

## 2015-09-04 DIAGNOSIS — R5382 Chronic fatigue, unspecified: Secondary | ICD-10-CM

## 2015-09-04 DIAGNOSIS — D509 Iron deficiency anemia, unspecified: Secondary | ICD-10-CM | POA: Insufficient documentation

## 2015-09-04 DIAGNOSIS — B86 Scabies: Secondary | ICD-10-CM | POA: Insufficient documentation

## 2015-09-04 DIAGNOSIS — Z79899 Other long term (current) drug therapy: Secondary | ICD-10-CM | POA: Insufficient documentation

## 2015-09-04 DIAGNOSIS — R5383 Other fatigue: Secondary | ICD-10-CM | POA: Insufficient documentation

## 2015-09-04 DIAGNOSIS — Z0000 Encounter for general adult medical examination without abnormal findings: Secondary | ICD-10-CM

## 2015-09-04 DIAGNOSIS — Z981 Arthrodesis status: Secondary | ICD-10-CM | POA: Insufficient documentation

## 2015-09-04 DIAGNOSIS — I6330 Cerebral infarction due to thrombosis of unspecified cerebral artery: Secondary | ICD-10-CM

## 2015-09-04 DIAGNOSIS — Z7902 Long term (current) use of antithrombotics/antiplatelets: Secondary | ICD-10-CM | POA: Insufficient documentation

## 2015-09-04 DIAGNOSIS — Z8673 Personal history of transient ischemic attack (TIA), and cerebral infarction without residual deficits: Secondary | ICD-10-CM | POA: Insufficient documentation

## 2015-09-04 LAB — POCT GLYCOSYLATED HEMOGLOBIN (HGB A1C): Hemoglobin A1C: 5.5

## 2015-09-04 LAB — CBC
HCT: 24.2 % — ABNORMAL LOW (ref 36.0–46.0)
Hemoglobin: 7 g/dL — ABNORMAL LOW (ref 12.0–15.0)
MCH: 17.2 pg — AB (ref 26.0–34.0)
MCHC: 28.9 g/dL — ABNORMAL LOW (ref 30.0–36.0)
MCV: 59.3 fL — ABNORMAL LOW (ref 78.0–100.0)
MPV: 8.6 fL (ref 8.6–12.4)
PLATELETS: 404 10*3/uL — AB (ref 150–400)
RBC: 4.08 MIL/uL (ref 3.87–5.11)
RDW: 22 % — ABNORMAL HIGH (ref 11.5–15.5)
WBC: 6.7 10*3/uL (ref 4.0–10.5)

## 2015-09-04 LAB — IRON AND TIBC
%SAT: 1 % — AB (ref 11–50)
TIBC: 439 ug/dL (ref 250–450)
UIBC: 434 ug/dL — ABNORMAL HIGH (ref 125–400)

## 2015-09-04 LAB — LIPID PANEL
CHOLESTEROL: 207 mg/dL — AB (ref 125–200)
HDL: 62 mg/dL (ref >=46)
LDL CALC: 127 mg/dL (ref <130)
TRIGLYCERIDES: 88 mg/dL (ref <150)
Total CHOL/HDL Ratio: 3.3 Ratio (ref <=5.0)
VLDL: 18 mg/dL (ref <30)

## 2015-09-04 LAB — TSH: TSH: 2.92 u[IU]/mL (ref 0.350–4.500)

## 2015-09-04 LAB — FERRITIN: Ferritin: 3 ng/mL — ABNORMAL LOW (ref 10–291)

## 2015-09-04 LAB — VITAMIN B12: Vitamin B-12: 675 pg/mL (ref 211–911)

## 2015-09-04 LAB — GLUCOSE, POCT (MANUAL RESULT ENTRY): POC Glucose: 105 mg/dl — AB (ref 70–99)

## 2015-09-04 MED ORDER — GABAPENTIN 100 MG PO CAPS: 100.0000 mg | ORAL_CAPSULE | Freq: Three times a day (TID) | ORAL | Status: DC

## 2015-09-04 MED ORDER — CLOPIDOGREL BISULFATE 75 MG PO TABS: 75.0000 mg | ORAL_TABLET | Freq: Every day | ORAL | Status: DC

## 2015-09-04 MED ORDER — VARENICLINE TARTRATE 1 MG PO TABS: 1.0000 mg | ORAL_TABLET | Freq: Two times a day (BID) | ORAL | Status: DC

## 2015-09-04 MED ORDER — VARENICLINE TARTRATE 0.5 MG X 11 & 1 MG X 42 PO MISC: ORAL | Status: DC

## 2015-09-04 MED ORDER — HYDROXYZINE PAMOATE 25 MG PO CAPS: 25.0000 mg | ORAL_CAPSULE | Freq: Three times a day (TID) | ORAL | Status: DC | PRN

## 2015-09-04 MED ORDER — ATORVASTATIN CALCIUM 40 MG PO TABS: 40.0000 mg | ORAL_TABLET | Freq: Every day | ORAL | Status: DC

## 2015-09-04 MED ORDER — PANTOPRAZOLE SODIUM 40 MG PO TBEC: 40.0000 mg | DELAYED_RELEASE_TABLET | Freq: Every day | ORAL | Status: DC

## 2015-09-04 NOTE — Progress Notes (Signed)
Patient ID: [name], female   DOB: February 14, 1971, 44 y.o.   MRN: [id number]   Subjective:  Patient ID: [name], female    DOB: 28-Oct-1971  Age: 44 y.o. MRN: [id number]  CC: Rash   HPI [name] presents for   1. Rash: x 10 days. Arm arms, lower stomach and back. Pruritic. Treated with permethrin cream. She could not afford ivermectin oral tablets. Rash is improving. She has contact at work, she works in nursing home, with similar rash that is more diffuse. She is still pruritic. She is not using a daily moisturizer.  2. Hx of CVA: CVA and TIA in same year. W/u negative. Patient stopped lipitor and plavix. She is a smoker. She denies lasting deficit from CVA.   3. GERD: taking zantac w/o improvement. Taking zantac 3-4 times a day. Requesting other treatment.    4. Chronic fatigue: has known anemia. Taking oral iron. Still fatigue. Last Hgb 7.9 in 03/2015. No bleeding currently.   Social History  Substance Use Topics  . Smoking status: Current Some Day Smoker -- 0.50 packs/day for 26 years    Types: Cigarettes    Last Attempt to Quit: 02/07/2015  . Smokeless tobacco: Never Used  . Alcohol Use: No    Outpatient Prescriptions Prior to Visit  Medication Sig Dispense Refill  . fluticasone (FLONASE) 50 MCG/ACT nasal spray Place 2 sprays into both nostrils daily. (Patient taking differently: Place 2 sprays into both nostrils at bedtime. ) 16 g 2  . iron polysaccharides (NIFEREX) 150 MG capsule Take 1 capsule (150 mg total) by mouth daily. (Patient taking differently: Take 150 mg by mouth 2 (two) times daily. ) 30 capsule 1  . pantoprazole (PROTONIX) 40 MG tablet Take 1 tablet (40 mg total) by mouth daily. Take 30-60 min before first meal of the day 30 tablet 2  . albuterol (PROVENTIL HFA;VENTOLIN HFA) 108 (90 BASE) MCG/ACT inhaler Inhale 2 puffs into the lungs every 6 (six) hours as needed for wheezing or shortness of breath. (Patient not taking: Reported on 09/04/2015) 1 Inhaler  0  . chlorpheniramine (CHLOR-TRIMETON) 4 MG tablet Take 8 mg by mouth at bedtime. May take 1 extra every 4 hrs as needed    . dextromethorphan (DELSYM) 30 MG/5ML liquid Take 30 mg by mouth every 12 (twelve) hours as needed for cough.    . diphenhydrAMINE (BENADRYL) 25 MG tablet Take 1 tablet (25 mg total) by mouth every 6 (six) hours. (Patient not taking: Reported on 09/04/2015) 20 tablet 0  . famotidine (PEPCID) 20 MG tablet One at bedtime (Patient not taking: Reported on 09/04/2015) 30 tablet 2  . gabapentin (NEURONTIN) 100 MG capsule Take 1 capsule (100 mg total) by mouth 3 (three) times daily. (Patient not taking: Reported on 09/04/2015) 90 capsule 3  . guaiFENesin (MUCINEX) 600 MG 12 hr tablet Take 600 mg by mouth every 12 (twelve) hours as needed.    . ivermectin (STROMECTOL) 3 MG TABS tablet Take 6.5 tablets (19,500 mcg total) by mouth once. Take on days 1, 2, 8, 9, and 15. (Patient not taking: Reported on 09/04/2015) 33 tablet 0  . permethrin (ELIMITE) 5 % cream Apply 2 application topically once. Apply once today and then once in one week. (Patient not taking: Reported on 09/04/2015) 60 g 1   No facility-administered medications prior to visit.    ROS Review of Systems  Constitutional: Positive for fatigue. Negative for fever and chills.  Eyes: Negative for visual disturbance.  Respiratory: Negative for shortness of breath.   Cardiovascular: Negative for chest pain.  Gastrointestinal: Negative for abdominal pain and blood in stool.  Musculoskeletal: Negative for back pain and arthralgias.  Skin: Positive for rash.  Allergic/Immunologic: Negative for immunocompromised state.  Neurological: Negative for facial asymmetry, weakness and numbness.  Hematological: Negative for adenopathy. Does not bruise/bleed easily.  Psychiatric/Behavioral: Negative for suicidal ideas and dysphoric mood.    Objective:  BP 106/72 mmHg  Pulse 76  Temp(Src) 98 F (36.7 C) (Oral)  Resp 16  Ht 5'  3.5" (1.613 m)  Wt 196 lb (88.905 kg)  BMI 34.17 kg/m2  SpO2 100%  LMP 09/01/2015  BP/Weight 09/04/2015 26/71/2458 0/07/9832  Systolic BP 825 053 976  Diastolic BP 72 84 78  Wt. (Lbs) 196 - 208.9  BMI 34.17 - 35.84    Physical Exam  Constitutional: She is oriented to person, place, and time. She appears well-developed and well-nourished. No distress.  HENT:  Head: Normocephalic and atraumatic.  Cardiovascular: Normal rate, regular rhythm, normal heart sounds and intact distal pulses.   Pulmonary/Chest: Effort normal and breath sounds normal.  Musculoskeletal: She exhibits no edema.  Neurological: She is alert and oriented to person, place, and time.  Skin: Skin is warm and dry. Rash noted.  Papular rash on arms and trunk with evidence of excoriation No erythema No crusting   Psychiatric: She has a normal mood and affect.   Assessment & Plan:   Problem List Items Addressed This Visit    Anemia (Chronic)   Relevant Orders   CBC   Iron and TIBC   Ferritin   CVA (cerebral infarction) (Chronic)   Relevant Medications   atorvastatin (LIPITOR) 40 MG tablet   clopidogrel (PLAVIX) 75 MG tablet   Other Relevant Orders   Lipid Panel   Fatigue   Relevant Orders   TSH   Vitamin B12   HgB A1c (Completed)   Glucose (CBG) (Completed)   S/P cervical spinal fusion (Chronic)   Relevant Medications   gabapentin (NEURONTIN) 100 MG capsule   Scabies (Chronic)   Relevant Medications   hydrOXYzine (VISTARIL) 25 MG capsule   Tobacco use disorder (Chronic)   Relevant Medications   varenicline (CHANTIX STARTING MONTH PAK) 0.5 MG X 11 & 1 MG X 42 tablet   varenicline (CHANTIX CONTINUING MONTH PAK) 1 MG tablet    Other Visit Diagnoses    Healthcare maintenance    -  Primary    Relevant Orders    Flu Vaccine QUAD 36+ mos IM (Completed)    Gastroesophageal reflux disease, esophagitis presence not specified        Relevant Medications    pantoprazole (PROTONIX) 40 MG tablet        Meds ordered this encounter  Medications  . varenicline (CHANTIX STARTING MONTH PAK) 0.5 MG X 11 & 1 MG X 42 tablet    Sig: Taper per packet insert    Dispense:  53 tablet    Refill:  0  . varenicline (CHANTIX CONTINUING MONTH PAK) 1 MG tablet    Sig: Take 1 tablet (1 mg total) by mouth 2 (two) times daily.    Dispense:  60 tablet    Refill:  1  . hydrOXYzine (VISTARIL) 25 MG capsule    Sig: Take 1 capsule (25 mg total) by mouth 3 (three) times daily as needed for itching.    Dispense:  30 capsule    Refill:  2  . gabapentin (NEURONTIN) 100 MG capsule  Sig: Take 1 capsule (100 mg total) by mouth 3 (three) times daily.    Dispense:  90 capsule    Refill:  3  . atorvastatin (LIPITOR) 40 MG tablet    Sig: Take 1 tablet (40 mg total) by mouth daily.    Dispense:  90 tablet    Refill:  3  . clopidogrel (PLAVIX) 75 MG tablet    Sig: Take 1 tablet (75 mg total) by mouth daily.    Dispense:  90 tablet    Refill:  3  . pantoprazole (PROTONIX) 40 MG tablet    Sig: Take 1 tablet (40 mg total) by mouth daily. Take 30-60 min before first meal of the day    Dispense:  30 tablet    Refill:  5    Follow-up: No Follow-up on file.   Boykin Nearing MD

## 2015-09-04 NOTE — Progress Notes (Signed)
C/C rash on arms  F/U stroke in 2014

## 2015-09-04 NOTE — Patient Instructions (Signed)
Arnella was seen today for rash.  Diagnoses and all orders for this visit:  Healthcare maintenance -     Flu Vaccine QUAD 36+ mos IM  Anemia, unspecified anemia type -     CBC -     Iron and TIBC -     Ferritin  Chronic fatigue -     TSH -     Vitamin B12 -     HgB A1c -     Glucose (CBG)  Tobacco use disorder -     varenicline (CHANTIX STARTING MONTH PAK) 0.5 MG X 11 & 1 MG X 42 tablet; Taper per packet insert -     varenicline (CHANTIX CONTINUING MONTH PAK) 1 MG tablet; Take 1 tablet (1 mg total) by mouth 2 (two) times daily.  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 -     clopidogrel (PLAVIX) 75 MG tablet; Take 1 tablet (75 mg total) by mouth daily. -     Lipid Panel  Scabies -     hydrOXYzine (VISTARIL) 25 MG capsule; Take 1 capsule (25 mg total) by mouth 3 (three) times daily as needed for itching.  S/P cervical spinal fusion -     gabapentin (NEURONTIN) 100 MG capsule; Take 1 capsule (100 mg total) by mouth 3 (three) times daily.  Gastroesophageal reflux disease, esophagitis presence not specified -     pantoprazole (PROTONIX) 40 MG tablet; Take 1 tablet (40 mg total) by mouth daily. Take 30-60 min before first meal of the day    F.u in 4-6 weeks for pap smear  Dr. Adrian Blackwater

## 2015-09-10 ENCOUNTER — Telehealth: Payer: Self-pay | Admitting: Family Medicine

## 2015-09-10 MED ORDER — FERROUS SULFATE 325 (65 FE) MG PO TABS: 325.0000 mg | ORAL_TABLET | Freq: Three times a day (TID) | ORAL | Status: DC

## 2015-09-10 NOTE — Telephone Encounter (Signed)
-----   Message from Boykin Nearing, MD sent at 09/10/2015  2:21 PM EDT ----- Very low iron levels with low Hgb Please increase iron to three times a day, ordered  GI referral for colonoscopy

## 2015-09-10 NOTE — Addendum Note (Signed)
Addended by: Boykin Nearing on: 09/10/2015 02:22 PM   Modules accepted: Orders

## 2015-09-10 NOTE — Telephone Encounter (Signed)
Patient called and requested her blood work results, please f/u with pt.

## 2015-09-10 NOTE — Telephone Encounter (Signed)
Date of birth verified by pt Lab results given  Low Iron levels, Increased iron to TID Rx at [place]  Pt verbalized understanding  Notified GI Referral order

## 2015-10-09 ENCOUNTER — Other Ambulatory Visit: Payer: Self-pay | Admitting: Family Medicine

## 2015-10-10 ENCOUNTER — Encounter: Payer: Self-pay | Admitting: Internal Medicine

## 2015-10-31 ENCOUNTER — Inpatient Hospital Stay (HOSPITAL_COMMUNITY)
Admission: EM | Admit: 2015-10-31 | Discharge: 2015-11-02 | DRG: 192 | Disposition: A | Discharge status: Home / Self Care | Payer: Managed Care, Other (non HMO) | Attending: Internal Medicine | Admitting: Internal Medicine

## 2015-10-31 ENCOUNTER — Encounter (HOSPITAL_COMMUNITY): Payer: Self-pay | Admitting: Emergency Medicine

## 2015-10-31 ENCOUNTER — Emergency Department (HOSPITAL_COMMUNITY): Payer: Managed Care, Other (non HMO)

## 2015-10-31 DIAGNOSIS — R0602 Shortness of breath: Secondary | ICD-10-CM | POA: Diagnosis not present

## 2015-10-31 DIAGNOSIS — F17201 Nicotine dependence, unspecified, in remission: Secondary | ICD-10-CM | POA: Diagnosis present

## 2015-10-31 DIAGNOSIS — D509 Iron deficiency anemia, unspecified: Secondary | ICD-10-CM | POA: Diagnosis present

## 2015-10-31 DIAGNOSIS — E785 Hyperlipidemia, unspecified: Secondary | ICD-10-CM | POA: Diagnosis present

## 2015-10-31 DIAGNOSIS — R7989 Other specified abnormal findings of blood chemistry: Secondary | ICD-10-CM

## 2015-10-31 DIAGNOSIS — E079 Disorder of thyroid, unspecified: Secondary | ICD-10-CM | POA: Diagnosis present

## 2015-10-31 DIAGNOSIS — J441 Chronic obstructive pulmonary disease with (acute) exacerbation: Secondary | ICD-10-CM | POA: Diagnosis present

## 2015-10-31 DIAGNOSIS — R000 Tachycardia, unspecified: Secondary | ICD-10-CM | POA: Diagnosis present

## 2015-10-31 DIAGNOSIS — E876 Hypokalemia: Secondary | ICD-10-CM | POA: Diagnosis present

## 2015-10-31 DIAGNOSIS — Z720 Tobacco use: Secondary | ICD-10-CM

## 2015-10-31 DIAGNOSIS — Z8673 Personal history of transient ischemic attack (TIA), and cerebral infarction without residual deficits: Secondary | ICD-10-CM

## 2015-10-31 LAB — CBC WITH DIFFERENTIAL/PLATELET
BASOS PCT: 0 %
Basophils Absolute: 0 10*3/uL (ref 0.0–0.1)
EOS PCT: 1 %
Eosinophils Absolute: 0.1 10*3/uL (ref 0.0–0.7)
HEMATOCRIT: 28.5 % — AB (ref 36.0–46.0)
HEMOGLOBIN: 8 g/dL — AB (ref 12.0–15.0)
LYMPHS ABS: 2.2 10*3/uL (ref 0.7–4.0)
LYMPHS PCT: 21 %
MCH: 17.9 pg — AB (ref 26.0–34.0)
MCHC: 28.1 g/dL — AB (ref 30.0–36.0)
MCV: 63.8 fL — AB (ref 78.0–100.0)
MONOS PCT: 7 %
Monocytes Absolute: 0.7 10*3/uL (ref 0.1–1.0)
NEUTROS ABS: 7.3 10*3/uL (ref 1.7–7.7)
Neutrophils Relative %: 71 %
Platelets: 270 10*3/uL (ref 150–400)
RBC: 4.47 MIL/uL (ref 3.87–5.11)
RDW: 22.6 % — ABNORMAL HIGH (ref 11.5–15.5)
WBC: 10.3 10*3/uL (ref 4.0–10.5)

## 2015-10-31 LAB — COMPREHENSIVE METABOLIC PANEL
ALBUMIN: 3.4 g/dL — AB (ref 3.5–5.0)
ALK PHOS: 66 U/L (ref 38–126)
ALT: 19 U/L (ref 14–54)
ANION GAP: 12 (ref 5–15)
AST: 40 U/L (ref 15–41)
BILIRUBIN TOTAL: 0.2 mg/dL — AB (ref 0.3–1.2)
BUN: 11 mg/dL (ref 6–20)
CALCIUM: 8.4 mg/dL — AB (ref 8.9–10.3)
CO2: 23 mmol/L (ref 22–32)
Chloride: 105 mmol/L (ref 101–111)
Creatinine, Ser: 1.02 mg/dL — ABNORMAL HIGH (ref 0.44–1.00)
GLUCOSE: 139 mg/dL — AB (ref 65–99)
Potassium: 3.1 mmol/L — ABNORMAL LOW (ref 3.5–5.1)
Sodium: 140 mmol/L (ref 135–145)
TOTAL PROTEIN: 6.6 g/dL (ref 6.5–8.1)

## 2015-10-31 LAB — I-STAT TROPONIN, ED: TROPONIN I, POC: 0 ng/mL (ref 0.00–0.08)

## 2015-10-31 LAB — D-DIMER, QUANTITATIVE (NOT AT ARMC): D DIMER QUANT: 0.81 ug{FEU}/mL — AB (ref 0.00–0.50)

## 2015-10-31 MED ORDER — SODIUM CHLORIDE 0.9 % IV BOLUS (SEPSIS)
1000.0000 mL | Freq: Once | INTRAVENOUS | Status: AC
  Administered 2015-10-31: 1000 mL via INTRAVENOUS

## 2015-10-31 MED ORDER — ALBUTEROL SULFATE (2.5 MG/3ML) 0.083% IN NEBU
INHALATION_SOLUTION | RESPIRATORY_TRACT | Status: AC
  Filled 2015-10-31: qty 6

## 2015-10-31 MED ORDER — METHYLPREDNISOLONE SODIUM SUCC 125 MG IJ SOLR
125.0000 mg | Freq: Once | INTRAMUSCULAR | Status: AC
  Administered 2015-11-01: 125 mg via INTRAVENOUS
  Filled 2015-10-31: qty 2

## 2015-10-31 MED ORDER — ALBUTEROL SULFATE (2.5 MG/3ML) 0.083% IN NEBU
5.0000 mg | INHALATION_SOLUTION | Freq: Once | RESPIRATORY_TRACT | Status: AC
  Administered 2015-10-31: 5 mg via RESPIRATORY_TRACT

## 2015-10-31 MED ORDER — IPRATROPIUM-ALBUTEROL 0.5-2.5 (3) MG/3ML IN SOLN
3.0000 mL | Freq: Once | RESPIRATORY_TRACT | Status: AC
  Administered 2015-10-31: 3 mL via RESPIRATORY_TRACT
  Filled 2015-10-31: qty 3

## 2015-10-31 NOTE — ED Provider Notes (Signed)
CSN: [id number]     Arrival date & time 10/31/15  2104 History   First MD Initiated Contact with Patient 10/31/15 2209     Chief Complaint  Patient presents with  . Cough  . Wheezing     (Consider location/radiation/quality/duration/timing/severity/associated sxs/prior Treatment) HPI [name] is a 44 y.o. female with history of TIAs, pneumonia, anemia, presents to emergency department complaining of shortness of breath, cough, wheezing. Patient states symptoms started 2 days ago. She has been using over-the-counter flu and cold medications with no relief of her symptoms. She states that she did one breathing treatment and 1 use of inhalers today which did not help. She believes they were both expired. She states her symptoms started with sore throat and congestion, but quickly progressed to cough and shortness of breath and wheezing. She denies any swelling in her extremities. No pain in her calves. No recent travel or surgeries. No history of DVTs. Denies chest pain, admits to chest tightness.  Past Medical History  Diagnosis Date  . Stroke ([place]) 05/2013  . History of TIAs   . Pneumonia   . Headache(784.0)   . Numbness     Right hand  . Anemia   . History of blood transfusion     Hx; of in 1991 after delivery   Past Surgical History  Procedure Laterality Date  . Cesarean section      x 1 with 5th pregnancy  . Cholecystectomy    . Tee without cardioversion N/A 05/21/2013    Procedure: TRANSESOPHAGEAL ECHOCARDIOGRAM (TEE);  Surgeon: Sueanne Margarita, MD;  Location: [facility] ENDOSCOPY;  Service: Cardiovascular;  Laterality: N/A;  . Tubal ligation    . Anterior cervical decomp/discectomy fusion N/A 03/06/2014    Procedure: ANTERIOR CERVICAL DECOMPRESSION/DISCECTOMY FUSION 2 LEVELS four/five, five/six;  Surgeon: Eustace Moore, MD;  Location: [facility] NEURO ORS;  Service: Neurosurgery;  Laterality: N/A;   Family History  Problem Relation Age of Onset  . Renal Disease Father     dialysis  .  Hypertension Father   . Heart disease Maternal Grandfather   . Asthma Sister   . Asthma Maternal Aunt   . Heart disease Mother    Social History  Substance Use Topics  . Smoking status: Current Some Day Smoker -- 0.50 packs/day for 26 years    Types: Cigarettes    Last Attempt to Quit: 02/07/2015  . Smokeless tobacco: Never Used  . Alcohol Use: No   OB History    No data available     Review of Systems  Constitutional: Negative for fever and chills.  HENT: Positive for congestion and sore throat.   Respiratory: Positive for cough, chest tightness, shortness of breath and wheezing.   Cardiovascular: Negative for chest pain, palpitations and leg swelling.  Gastrointestinal: Negative for nausea, vomiting, abdominal pain and diarrhea.  Musculoskeletal: Negative for myalgias, arthralgias, neck pain and neck stiffness.  Skin: Negative for rash.  Neurological: Negative for dizziness, weakness and headaches.  All other systems reviewed and are negative.     Allergies  Shellfish allergy and Latex  Home Medications   Prior to Admission medications   Medication Sig Start Date End Date Taking? Authorizing Provider  albuterol (PROVENTIL HFA;VENTOLIN HFA) 108 (90 BASE) MCG/ACT inhaler Inhale 2 puffs into the lungs every 6 (six) hours as needed for wheezing or shortness of breath.   Yes Historical Provider, MD  atorvastatin (LIPITOR) 40 MG tablet Take 1 tablet (40 mg total) by mouth daily. 09/04/15  Yes Boykin Nearing, MD  clopidogrel (PLAVIX) 75 MG tablet Take 1 tablet (75 mg total) by mouth daily. 09/04/15  Yes Josalyn Funches, MD  ferrous sulfate 325 (65 FE) MG tablet Take 1 tablet (325 mg total) by mouth 3 (three) times daily with meals. 09/10/15  Yes Josalyn Funches, MD  fluticasone (FLONASE) 50 MCG/ACT nasal spray Place 2 sprays into both nostrils daily. Patient taking differently: Place 2 sprays into both nostrils at bedtime.  10/28/14  Yes Lorayne Marek, MD  gabapentin  (NEURONTIN) 100 MG capsule Take 1 capsule (100 mg total) by mouth 3 (three) times daily. 09/04/15  Yes Josalyn Funches, MD  hydrOXYzine (VISTARIL) 25 MG capsule Take 1 capsule (25 mg total) by mouth 3 (three) times daily as needed for itching. 09/04/15  Yes Josalyn Funches, MD  Multiple Vitamin (MULTIVITAMIN WITH MINERALS) TABS tablet Take 1 tablet by mouth daily.   Yes Historical Provider, MD  pantoprazole (PROTONIX) 40 MG tablet Take 1 tablet (40 mg total) by mouth daily. Take 30-60 min before first meal of the day 09/04/15  Yes Josalyn Funches, MD  ranitidine (ZANTAC) 150 MG tablet Take 150 mg by mouth daily.   Yes Historical Provider, MD   BP 143/82 mmHg  Pulse 136  Temp(Src) 98.3 F (36.8 C) (Oral)  Resp 11  Ht 5\' 3"  (1.6 m)  Wt 86.183 kg  BMI 33.67 kg/m2  SpO2 94%  LMP 10/25/2015 Physical Exam  Constitutional: She is oriented to person, place, and time. She appears well-developed and well-nourished. No distress.  HENT:  Head: Normocephalic.  Right Ear: External ear normal.  Left Ear: External ear normal.  Nose: Nose normal.  Mouth/Throat: Oropharynx is clear and moist.  Eyes: Conjunctivae are normal.  Neck: Normal range of motion. Neck supple.  Cardiovascular: Regular rhythm and normal heart sounds.   Tachycardic  Pulmonary/Chest: Effort normal. No respiratory distress. She has wheezes. She has no rales.  Diffuse inspiratory and expiratory wheezes bilaterally  Abdominal: Soft. Bowel sounds are normal. She exhibits no distension. There is no tenderness. There is no rebound.  Musculoskeletal: She exhibits no edema.  Neurological: She is alert and oriented to person, place, and time.  Skin: Skin is warm and dry.  Psychiatric: She has a normal mood and affect. Her behavior is normal.  Nursing note and vitals reviewed.   ED Course  Procedures (including critical care time) Labs Review Labs Reviewed  CBC WITH DIFFERENTIAL/PLATELET - Abnormal; Notable for the following:     Hemoglobin 8.0 (*)    HCT 28.5 (*)    MCV 63.8 (*)    MCH 17.9 (*)    MCHC 28.1 (*)    RDW 22.6 (*)    All other components within normal limits  COMPREHENSIVE METABOLIC PANEL - Abnormal; Notable for the following:    Potassium 3.1 (*)    Glucose, Bld 139 (*)    Creatinine, Ser 1.02 (*)    Calcium 8.4 (*)    Albumin 3.4 (*)    Total Bilirubin 0.2 (*)    All other components within normal limits  D-DIMER, QUANTITATIVE (NOT AT [facility]) - Abnormal; Notable for the following:    D-Dimer, Quant 0.81 (*)    All other components within normal limits  I-STAT TROPOININ, ED  I-STAT BETA HCG BLOOD, ED ([facility], [facility], [facility] ONLY)    Imaging Review Dg Chest 2 View  10/31/2015  CLINICAL DATA:  Productive cough with wheezing for 3 days.  Smoker. EXAM: CHEST  2 VIEW COMPARISON:  03/11/2015 and 09/26/2014 radiographs. FINDINGS: The heart size and mediastinal contours are stable. Interstitial prominence has increased from the prior studies. The pulmonary vasculature appears normal. There is no confluent airspace opacity, hyperinflation or significant pleural effusion. Patient is status post lower cervical fusion and cholecystectomy. IMPRESSION: Progressive interstitial prominence compared with priors, suggesting possible bronchitis. No evidence of pneumonia. Electronically Signed   By: Richardean Sale M.D.   On: 10/31/2015 23:14   I have personally reviewed and evaluated these images and lab results as part of my medical decision-making.   EKG Interpretation   Date/Time:  Friday October 31 2015 23:31:56 EST Ventricular Rate:  127 PR Interval:  86 QRS Duration: 93 QT Interval:  396 QTC Calculation: 576 R Axis:   50 Text Interpretation:  Sinus tachycardia RSR' in V1 or V2, right VCD or RVH  Nonspecific repol abnormality, lateral leads Prolonged QT interval SINCE  LAST TRACING HEART RATE HAS INCREASED and rate related ST abnormality, and  QT has lengthened Confirmed by Eulis Foster  MD, ELLIOTT [id number]) on  10/31/2015  11:35:59 PM      MDM   Final diagnoses:  COPD exacerbation (HCC)  Tachycardia  Shortness of breath   Pt is tachycardin in 140s, wheezing, oxygen sat in low 90s. Received 1 neb treatment already. Will add more treatments, labs, including d dimer, trop, ecg.   ECG showing sinus tachycardia, some st depression, possibly rate related vs ischemia. Trop negative. Receiving fluids.D dimer elevated, will add CT agio. Pt continues to wheeze after 2 tx, third ordered.   1:50 AM Delay in obtaining CT angio due to IV infiltration. Pt continues to be tachycardic despite 2L of NS. Potassium replaced. Third tx pending. Mag ordered for additional tx. CT pending. Awaiting triad to call back for admission.   Spoke with triad, will admit. CT pending at time of admission, Dr. Leonides Schanz to follow up.     Jeannett Senior, PA-C 11/02/15 0103  Daleen Bo, MD 11/05/15 [phone]

## 2015-10-31 NOTE — ED Notes (Signed)
C/o productive cough with white sputum and wheezing x 2 days.  Denies fever.

## 2015-11-01 ENCOUNTER — Emergency Department (HOSPITAL_COMMUNITY): Payer: Managed Care, Other (non HMO)

## 2015-11-01 ENCOUNTER — Encounter (HOSPITAL_COMMUNITY): Payer: Self-pay | Admitting: Radiology

## 2015-11-01 DIAGNOSIS — D509 Iron deficiency anemia, unspecified: Secondary | ICD-10-CM | POA: Diagnosis present

## 2015-11-01 DIAGNOSIS — J441 Chronic obstructive pulmonary disease with (acute) exacerbation: Secondary | ICD-10-CM | POA: Diagnosis present

## 2015-11-01 DIAGNOSIS — F17200 Nicotine dependence, unspecified, uncomplicated: Secondary | ICD-10-CM | POA: Diagnosis not present

## 2015-11-01 DIAGNOSIS — E785 Hyperlipidemia, unspecified: Secondary | ICD-10-CM | POA: Diagnosis present

## 2015-11-01 DIAGNOSIS — E876 Hypokalemia: Secondary | ICD-10-CM | POA: Diagnosis present

## 2015-11-01 DIAGNOSIS — Z720 Tobacco use: Secondary | ICD-10-CM | POA: Diagnosis not present

## 2015-11-01 DIAGNOSIS — R000 Tachycardia, unspecified: Secondary | ICD-10-CM | POA: Diagnosis present

## 2015-11-01 DIAGNOSIS — R791 Abnormal coagulation profile: Secondary | ICD-10-CM

## 2015-11-01 DIAGNOSIS — Z8673 Personal history of transient ischemic attack (TIA), and cerebral infarction without residual deficits: Secondary | ICD-10-CM | POA: Diagnosis not present

## 2015-11-01 DIAGNOSIS — E079 Disorder of thyroid, unspecified: Secondary | ICD-10-CM | POA: Diagnosis present

## 2015-11-01 DIAGNOSIS — R0602 Shortness of breath: Secondary | ICD-10-CM | POA: Insufficient documentation

## 2015-11-01 DIAGNOSIS — R7989 Other specified abnormal findings of blood chemistry: Secondary | ICD-10-CM

## 2015-11-01 LAB — COMPREHENSIVE METABOLIC PANEL
ALBUMIN: 3.2 g/dL — AB (ref 3.5–5.0)
ALK PHOS: 69 U/L (ref 38–126)
ALT: 18 U/L (ref 14–54)
ANION GAP: 10 (ref 5–15)
AST: 29 U/L (ref 15–41)
BILIRUBIN TOTAL: 0.4 mg/dL (ref 0.3–1.2)
BUN: 10 mg/dL (ref 6–20)
CALCIUM: 8.5 mg/dL — AB (ref 8.9–10.3)
CO2: 21 mmol/L — ABNORMAL LOW (ref 22–32)
Chloride: 108 mmol/L (ref 101–111)
Creatinine, Ser: 0.83 mg/dL (ref 0.44–1.00)
GFR calc Af Amer: >60 mL/min (ref >60)
GLUCOSE: 174 mg/dL — AB (ref 65–99)
Potassium: 4 mmol/L (ref 3.5–5.1)
Sodium: 139 mmol/L (ref 135–145)
TOTAL PROTEIN: 6.8 g/dL (ref 6.5–8.1)

## 2015-11-01 LAB — CBC
HCT: 26.5 % — ABNORMAL LOW (ref 36.0–46.0)
HEMOGLOBIN: 7.6 g/dL — AB (ref 12.0–15.0)
MCH: 18.2 pg — ABNORMAL LOW (ref 26.0–34.0)
MCHC: 28.7 g/dL — AB (ref 30.0–36.0)
MCV: 63.5 fL — ABNORMAL LOW (ref 78.0–100.0)
Platelets: 297 10*3/uL (ref 150–400)
RBC: 4.17 MIL/uL (ref 3.87–5.11)
RDW: 22.3 % — AB (ref 11.5–15.5)
WBC: 13 10*3/uL — AB (ref 4.0–10.5)

## 2015-11-01 LAB — TSH: TSH: 1.04 u[IU]/mL (ref 0.350–4.500)

## 2015-11-01 MED ORDER — POTASSIUM CHLORIDE CRYS ER 20 MEQ PO TBCR
40.0000 meq | EXTENDED_RELEASE_TABLET | Freq: Once | ORAL | Status: AC
  Administered 2015-11-01: 40 meq via ORAL
  Filled 2015-11-01: qty 2

## 2015-11-01 MED ORDER — ACETAMINOPHEN 650 MG RE SUPP: 650.0000 mg | Freq: Four times a day (QID) | RECTAL | Status: DC | PRN

## 2015-11-01 MED ORDER — PANTOPRAZOLE SODIUM 40 MG PO TBEC
40.0000 mg | DELAYED_RELEASE_TABLET | Freq: Every day | ORAL | Status: DC
  Administered 2015-11-01 – 2015-11-02 (×2): 40 mg via ORAL
  Filled 2015-11-01 (×2): qty 1

## 2015-11-01 MED ORDER — GABAPENTIN 100 MG PO CAPS
100.0000 mg | ORAL_CAPSULE | Freq: Three times a day (TID) | ORAL | Status: DC
  Administered 2015-11-01 – 2015-11-02 (×3): 100 mg via ORAL
  Filled 2015-11-01 (×4): qty 1

## 2015-11-01 MED ORDER — IPRATROPIUM-ALBUTEROL 0.5-2.5 (3) MG/3ML IN SOLN
3.0000 mL | Freq: Once | RESPIRATORY_TRACT | Status: AC
  Administered 2015-11-01: 3 mL via RESPIRATORY_TRACT
  Filled 2015-11-01 (×2): qty 3

## 2015-11-01 MED ORDER — IPRATROPIUM-ALBUTEROL 0.5-2.5 (3) MG/3ML IN SOLN
3.0000 mL | Freq: Four times a day (QID) | RESPIRATORY_TRACT | Status: DC
  Administered 2015-11-01 – 2015-11-02 (×5): 3 mL via RESPIRATORY_TRACT
  Filled 2015-11-01 (×5): qty 3

## 2015-11-01 MED ORDER — SODIUM CHLORIDE 0.9 % IV BOLUS (SEPSIS)
1000.0000 mL | Freq: Once | INTRAVENOUS | Status: AC
  Administered 2015-11-01: 1000 mL via INTRAVENOUS

## 2015-11-01 MED ORDER — ONDANSETRON HCL 4 MG/2ML IJ SOLN: 4.0000 mg | Freq: Four times a day (QID) | INTRAMUSCULAR | Status: DC | PRN

## 2015-11-01 MED ORDER — ATORVASTATIN CALCIUM 40 MG PO TABS
40.0000 mg | ORAL_TABLET | Freq: Every day | ORAL | Status: DC
Start: 2015-11-01 — End: 2015-11-02
  Administered 2015-11-01 – 2015-11-02 (×2): 40 mg via ORAL
  Filled 2015-11-01 (×2): qty 1

## 2015-11-01 MED ORDER — ACETAMINOPHEN 325 MG PO TABS
650.0000 mg | ORAL_TABLET | Freq: Four times a day (QID) | ORAL | Status: DC | PRN
  Administered 2015-11-02: 650 mg via ORAL
  Filled 2015-11-01: qty 2

## 2015-11-01 MED ORDER — IOHEXOL 350 MG/ML SOLN
100.0000 mL | Freq: Once | INTRAVENOUS | Status: AC | PRN
  Administered 2015-11-01: 100 mL via INTRAVENOUS

## 2015-11-01 MED ORDER — POTASSIUM CHLORIDE 10 MEQ/100ML IV SOLN
10.0000 meq | INTRAVENOUS | Status: DC
  Administered 2015-11-01: 10 meq via INTRAVENOUS
  Filled 2015-11-01: qty 100

## 2015-11-01 MED ORDER — FAMOTIDINE 20 MG PO TABS
20.0000 mg | ORAL_TABLET | Freq: Every day | ORAL | Status: DC
  Administered 2015-11-01 – 2015-11-02 (×3): 20 mg via ORAL
  Filled 2015-11-01 (×3): qty 1

## 2015-11-01 MED ORDER — HYDROXYZINE HCL 25 MG PO TABS: 25.0000 mg | ORAL_TABLET | Freq: Three times a day (TID) | ORAL | Status: DC | PRN

## 2015-11-01 MED ORDER — ONDANSETRON HCL 4 MG PO TABS: 4.0000 mg | ORAL_TABLET | Freq: Four times a day (QID) | ORAL | Status: DC | PRN

## 2015-11-01 MED ORDER — LEVOFLOXACIN IN D5W 750 MG/150ML IV SOLN
750.0000 mg | INTRAVENOUS | Status: DC
  Administered 2015-11-01 – 2015-11-02 (×2): 750 mg via INTRAVENOUS
  Filled 2015-11-01 (×2): qty 150

## 2015-11-01 MED ORDER — HYDROCODONE-HOMATROPINE 5-1.5 MG/5ML PO SYRP
5.0000 mL | ORAL_SOLUTION | ORAL | Status: DC | PRN
  Administered 2015-11-01: 5 mL via ORAL
  Filled 2015-11-01: qty 5

## 2015-11-01 MED ORDER — CLOPIDOGREL BISULFATE 75 MG PO TABS
75.0000 mg | ORAL_TABLET | Freq: Every day | ORAL | Status: DC
  Administered 2015-11-01 – 2015-11-02 (×2): 75 mg via ORAL
  Filled 2015-11-01 (×2): qty 1

## 2015-11-01 MED ORDER — ENOXAPARIN SODIUM 40 MG/0.4ML ~~LOC~~ SOLN
40.0000 mg | SUBCUTANEOUS | Status: DC
  Administered 2015-11-01 – 2015-11-02 (×2): 40 mg via SUBCUTANEOUS
  Filled 2015-11-01 (×3): qty 0.4

## 2015-11-01 MED ORDER — LORAZEPAM 2 MG/ML IJ SOLN
0.5000 mg | Freq: Once | INTRAMUSCULAR | Status: AC
  Administered 2015-11-01: 0.5 mg via INTRAVENOUS
  Filled 2015-11-01: qty 1

## 2015-11-01 MED ORDER — BENZONATATE 100 MG PO CAPS: 200.0000 mg | ORAL_CAPSULE | Freq: Two times a day (BID) | ORAL | Status: DC | PRN

## 2015-11-01 MED ORDER — BENZONATATE 100 MG PO CAPS: 100.0000 mg | ORAL_CAPSULE | Freq: Three times a day (TID) | ORAL | Status: DC | PRN

## 2015-11-01 MED ORDER — SODIUM CHLORIDE 0.9 % IV SOLN
INTRAVENOUS | Status: AC
  Administered 2015-11-01: 03:00:00 via INTRAVENOUS

## 2015-11-01 MED ORDER — ALBUTEROL SULFATE (2.5 MG/3ML) 0.083% IN NEBU
5.0000 mg | INHALATION_SOLUTION | RESPIRATORY_TRACT | Status: AC | PRN
  Administered 2015-11-01: 5 mg via RESPIRATORY_TRACT
  Filled 2015-11-01 (×2): qty 6

## 2015-11-01 MED ORDER — GUAIFENESIN ER 600 MG PO TB12
600.0000 mg | ORAL_TABLET | Freq: Two times a day (BID) | ORAL | Status: DC
  Administered 2015-11-01 – 2015-11-02 (×4): 600 mg via ORAL
  Filled 2015-11-01 (×4): qty 1

## 2015-11-01 MED ORDER — FLUTICASONE PROPIONATE 50 MCG/ACT NA SUSP
2.0000 | Freq: Every day | NASAL | Status: DC
  Administered 2015-11-01: 2 via NASAL
  Filled 2015-11-01: qty 16

## 2015-11-01 MED ORDER — MAGNESIUM SULFATE 2 GM/50ML IV SOLN
2.0000 g | Freq: Once | INTRAVENOUS | Status: AC
  Administered 2015-11-01: 2 g via INTRAVENOUS
  Filled 2015-11-01: qty 50

## 2015-11-01 MED ORDER — FERROUS SULFATE 325 (65 FE) MG PO TABS
325.0000 mg | ORAL_TABLET | Freq: Three times a day (TID) | ORAL | Status: DC
  Administered 2015-11-01 – 2015-11-02 (×3): 325 mg via ORAL
  Filled 2015-11-01 (×3): qty 1

## 2015-11-01 MED ORDER — ADULT MULTIVITAMIN W/MINERALS CH
1.0000 | ORAL_TABLET | Freq: Every day | ORAL | Status: DC
  Administered 2015-11-01 – 2015-11-02 (×2): 1 via ORAL
  Filled 2015-11-01 (×2): qty 1

## 2015-11-01 MED ORDER — METHYLPREDNISOLONE SODIUM SUCC 125 MG IJ SOLR
60.0000 mg | Freq: Three times a day (TID) | INTRAMUSCULAR | Status: DC
  Administered 2015-11-01 – 2015-11-02 (×4): 60 mg via INTRAVENOUS
  Filled 2015-11-01 (×4): qty 2

## 2015-11-01 MED ORDER — DOCUSATE SODIUM 100 MG PO CAPS
100.0000 mg | ORAL_CAPSULE | Freq: Two times a day (BID) | ORAL | Status: DC
  Administered 2015-11-01 – 2015-11-02 (×3): 100 mg via ORAL
  Filled 2015-11-01 (×3): qty 1

## 2015-11-01 NOTE — Progress Notes (Signed)
Patient was heading to CT, will attempt treatment when patient returns.

## 2015-11-01 NOTE — H&P (Addendum)
[facility] History and Physical  [name] [id number] DOB: 1971/10/27 DOA: 10/31/2015  Referring physician: ED PCP: Minerva Ends, MD   Chief Complaint: shortness of breath  HPI:  [name] is a 44 year old female with a past medical history significant for anemia, tobacco abuse, and TIA; who states symptoms started approximately three days ago with a scratchy throat and progress into a cough with thick white sputum production. She felt like she was just getting a cold so she tried over-the-counter medications, but symptoms progressed and she started wheezing and having more congestion. She tried using a breathing treatment then a leftover inhaler she had without relief of symptoms.  Denies any chest or calf pain. She reports coughing to the point where she vomited. Upon admission in to the emergency department patient was checked with chest x-ray which showed the possibility bronchitis.  Patient also was checked with a D dimer because of persistent tachycardia despite fluids, and was elevated at 1.81, Therefore a CT angiogram was also ordered but showed no acute signs of a PE.      Review of Systems  Constitutional: Negative for chills.  HENT: Negative for ear pain and hearing loss.   Eyes: Negative for photophobia and pain.  Respiratory: Positive for cough, sputum production, shortness of breath and wheezing.   Cardiovascular: Negative for leg swelling.       Chest tightness   Gastrointestinal: Positive for vomiting. Negative for nausea, constipation and blood in stool.  Genitourinary: Negative for frequency and hematuria.  Musculoskeletal: Positive for joint pain.  Skin: Positive for rash. Negative for itching.  Neurological: Negative for speech change.  Psychiatric/Behavioral: Negative for substance abuse.       Past Medical History  Diagnosis Date  . Stroke ([place]) 05/2013  . History of TIAs   . Pneumonia   . Headache(784.0)   . Numbness     Right  hand  . Anemia   . History of blood transfusion     Hx; of in 1991 after delivery     Past Surgical History  Procedure Laterality Date  . Cesarean section      x 1 with 5th pregnancy  . Cholecystectomy    . Tee without cardioversion N/A 05/21/2013    Procedure: TRANSESOPHAGEAL ECHOCARDIOGRAM (TEE);  Surgeon: Sueanne Margarita, MD;  Location: [facility] ENDOSCOPY;  Service: Cardiovascular;  Laterality: N/A;  . Tubal ligation    . Anterior cervical decomp/discectomy fusion N/A 03/06/2014    Procedure: ANTERIOR CERVICAL DECOMPRESSION/DISCECTOMY FUSION 2 LEVELS four/five, five/six;  Surgeon: Eustace Moore, MD;  Location: [facility] NEURO ORS;  Service: Neurosurgery;  Laterality: N/A;      Social History:  reports that she has been smoking Cigarettes.  She has a 13 pack-year smoking history. She has never used smokeless tobacco. She reports that she does not drink alcohol or use illicit drugs. Where does patient live--home Can patient participate in ADLs?yes  Allergies  Allergen Reactions  . Shellfish Allergy Itching and Swelling  . Latex Itching and Rash    Family History  Problem Relation Age of Onset  . Renal Disease Father     dialysis  . Hypertension Father   . Heart disease Maternal Grandfather   . Asthma Sister   . Asthma Maternal Aunt   . Heart disease Mother         Prior to Admission medications   Medication Sig Start Date End Date Taking? Authorizing Provider  albuterol (PROVENTIL HFA;VENTOLIN HFA) 108 (90 BASE) MCG/ACT  inhaler Inhale 2 puffs into the lungs every 6 (six) hours as needed for wheezing or shortness of breath.   Yes Historical Provider, MD  atorvastatin (LIPITOR) 40 MG tablet Take 1 tablet (40 mg total) by mouth daily. 09/04/15  Yes Josalyn Funches, MD  clopidogrel (PLAVIX) 75 MG tablet Take 1 tablet (75 mg total) by mouth daily. 09/04/15  Yes Josalyn Funches, MD  ferrous sulfate 325 (65 FE) MG tablet Take 1 tablet (325 mg total) by mouth 3 (three) times daily with  meals. 09/10/15  Yes Josalyn Funches, MD  fluticasone (FLONASE) 50 MCG/ACT nasal spray Place 2 sprays into both nostrils daily. Patient taking differently: Place 2 sprays into both nostrils at bedtime.  10/28/14  Yes Lorayne Marek, MD  gabapentin (NEURONTIN) 100 MG capsule Take 1 capsule (100 mg total) by mouth 3 (three) times daily. 09/04/15  Yes Josalyn Funches, MD  hydrOXYzine (VISTARIL) 25 MG capsule Take 1 capsule (25 mg total) by mouth 3 (three) times daily as needed for itching. 09/04/15  Yes Josalyn Funches, MD  Multiple Vitamin (MULTIVITAMIN WITH MINERALS) TABS tablet Take 1 tablet by mouth daily.   Yes Historical Provider, MD  pantoprazole (PROTONIX) 40 MG tablet Take 1 tablet (40 mg total) by mouth daily. Take 30-60 min before first meal of the day 09/04/15  Yes Josalyn Funches, MD  ranitidine (ZANTAC) 150 MG tablet Take 150 mg by mouth daily.   Yes Historical Provider, MD     Physical Exam: Filed Vitals:   11/01/15 0320 11/01/15 0353 11/01/15 0526 11/01/15 0816  BP:   133/72   Pulse:   125   Temp:   98.3 F (36.8 C)   TempSrc:   Oral   Resp:   15   Height:      Weight:      SpO2: 94% 94% 94% 90%     Constitutional: Vital signs reviewed. Patient is in mild respiratory distress and cooperative with exam. Alert and oriented x3.  Head: Normocephalic and atraumatic  Ear: TM normal bilaterally  Mouth: no erythema or exudates, MMM  Eyes: PERRL, EOMI, conjunctivae normal, No scleral icterus.  Neck: Supple, Trachea midline normal ROM, No JVD, mass, thyromegaly, or carotid bruit present.  Cardiovascular: tachycardic Pulmonary/Chest: Decreased aeration with diffuse expiratory wheeze. Able to speak in short sentences  Abdominal: Soft. Non-tender, non-distended, bowel sounds are normal, no masses, organomegaly, or guarding present.  GU: no CVA tenderness Musculoskeletal: No joint deformities, erythema, or stiffness, ROM full and no nontender Ext: no edema and no cyanosis, pulses  palpable bilaterally (DP and PT)  Hematology: no cervical, inginal, or axillary adenopathy.  Neurological: A&O x3, Strenght is normal and symmetric bilaterally, cranial nerve II-XII are grossly intact, no focal motor deficit, sensory intact to light touch bilaterally.  Skin: Warm, dry and intact. No rash, cyanosis, or clubbing.  Psychiatric: appears anxious and fidgeting during questioning      Data Review   Micro Results No results found for this or any previous visit (from the past 240 hour(s)).  Radiology Reports Dg Chest 2 View  10/31/2015  CLINICAL DATA:  Productive cough with wheezing for 3 days.  Smoker. EXAM: CHEST  2 VIEW COMPARISON:  03/11/2015 and 09/26/2014 radiographs. FINDINGS: The heart size and mediastinal contours are stable. Interstitial prominence has increased from the prior studies. The pulmonary vasculature appears normal. There is no confluent airspace opacity, hyperinflation or significant pleural effusion. Patient is status post lower cervical fusion and cholecystectomy. IMPRESSION: Progressive interstitial prominence compared with  priors, suggesting possible bronchitis. No evidence of pneumonia. Electronically Signed   By: Richardean Sale M.D.   On: 10/31/2015 23:14   Ct Angio Chest Pe W/cm &/or Wo Cm  11/01/2015  CLINICAL DATA:  Acute onset of productive cough and wheezing. Initial encounter. EXAM: CT ANGIOGRAPHY CHEST WITH CONTRAST TECHNIQUE: Multidetector CT imaging of the chest was performed using the standard protocol during bolus administration of intravenous contrast. Multiplanar CT image reconstructions and MIPs were obtained to evaluate the vascular anatomy. CONTRAST:  160mL OMNIPAQUE IOHEXOL 350 MG/ML SOLN COMPARISON:  Chest radiograph performed 10/31/2015 FINDINGS: There is no evidence of pulmonary embolus. Patchy bilateral central airspace opacification is noted. This may reflect pulmonary edema or atypical pneumonia. No pleural effusion or pneumothorax is  seen. No masses are identified; no abnormal focal contrast enhancement is seen. The mediastinum is unremarkable in appearance. A prominent right hilar node is noted, measuring 1.4 cm in short axis. No mediastinal lymphadenopathy is appreciated. No pericardial effusion is identified. The great vessels are grossly unremarkable in appearance. No axillary lymphadenopathy is seen. A 1.7 cm hypodensity is noted within the right thyroid lobe. The visualized portions of the liver and spleen are unremarkable. The patient is status post cholecystectomy, with clips noted at the gallbladder fossa. The visualized portions of the pancreas, adrenal glands and kidneys are within normal limits. No acute osseous abnormalities are seen. Review of the MIP images confirms the above findings. IMPRESSION: 1. No evidence of pulmonary embolus. 2. Patchy bilateral central airspace opacification may reflect pulmonary edema or atypical pneumonia. 3. Prominent right hilar node, measuring 1.4 cm in short axis. 4. 1.7 cm hypodensity within the right thyroid lobe. Consider further evaluation with thyroid ultrasound. If patient is clinically hyperthyroid, consider nuclear medicine thyroid uptake and scan. Electronically Signed   By: Garald Balding M.D.   On: 11/01/2015 02:33     CBC  Recent Labs Lab 10/31/15 2247  WBC 10.3  HGB 8.0*  HCT 28.5*  PLT 270  MCV 63.8*  MCH 17.9*  MCHC 28.1*  RDW 22.6*  LYMPHSABS 2.2  MONOABS 0.7  EOSABS 0.1  BASOSABS 0.0    Chemistries   Recent Labs Lab 10/31/15 2247  NA 140  K 3.1*  CL 105  CO2 23  GLUCOSE 139*  BUN 11  CREATININE 1.02*  CALCIUM 8.4*  AST 40  ALT 19  ALKPHOS 66  BILITOT 0.2*   ------------------------------------------------------------------------------------------------------------------ estimated creatinine clearance is 71.7 mL/min (by C-G formula based on Cr of  1.02). ------------------------------------------------------------------------------------------------------------------ No results for input(s): HGBA1C in the last 72 hours. ------------------------------------------------------------------------------------------------------------------ No results for input(s): CHOL, HDL, LDLCALC, TRIG, CHOLHDL, LDLDIRECT in the last 72 hours. ------------------------------------------------------------------------------------------------------------------  Recent Labs  11/01/15 0411  TSH 1.040   ------------------------------------------------------------------------------------------------------------------ No results for input(s): VITAMINB12, FOLATE, FERRITIN, TIBC, IRON, RETICCTPCT in the last 72 hours.  Coagulation profile No results for input(s): INR, PROTIME in the last 168 hours.   Recent Labs  10/31/15 2247  DDIMER 0.81*    Cardiac Enzymes No results for input(s): CKMB, TROPONINI, MYOGLOBIN in the last 168 hours.  Invalid input(s): CK ------------------------------------------------------------------------------------------------------------------ Invalid input(s): POCBNP   CBG: No results for input(s): GLUCAP in the last 168 hours.     EKG: Independently reviewed. Sinus Tachycardia    Assessment/Plan Principal Problem:    Acute exacerbation COPD exacerbation: Patient reported URI symptoms. Question underlying infection. - duonebs q6hr and prn q2 hrs - Solumedrol IV - Abx of levaquin - Mucinex  Iron deficiency anemia: Chronic. Low MCV and  MCH. Patient near baseline Hbg.  - Continue to monitor - ferrous sulfate  Sinus tachycardia: gradually improving. Hr initially in the 140's.  - continue to monitor  Elevated d-dimer: Neg CTA    Hypokalemia: K- 3.1  Replaced with potassium chloride -recheck and replace as  needed.  Tobacco use disorder: Patient continues to smoke. - counsel on the need of tobacco  cessation  Code Status:   full Family Communication: bedside Disposition Plan: admit   Total time spent 55 minutes.Greater than 50% of this time was spent in counseling, explanation of diagnosis, planning of further management, and coordination of care  [facility] Pager [phone]  If 7PM-7AM, please contact night-coverage [email or web address] Password [email or web address] 11/01/2015, 8:25 AM

## 2015-11-01 NOTE — ED Notes (Signed)
Notified CT that pt has a new IV.  Ready for transport.

## 2015-11-01 NOTE — Progress Notes (Signed)
Pt admitted around 0240 am today per stretcher accompanied by a nurse tech, pt orientated to the unit and equipment, call light and phone within reach and treatment started as prescribed

## 2015-11-01 NOTE — Progress Notes (Signed)
   [facility]                                                                              Patient Demographics  [name], is a 44 y.o. female, DOB - Dec 07, 1970, [id number]  Admit date - 10/31/2015   Admitting Physician Norval Morton, MD  Outpatient Primary MD for the patient is Minerva Ends, MD  LOS - 0   Chief Complaint  Patient presents with  . Cough  . Wheezing      HPI on 11/01/2015 by Dr. Fuller Plan [name] is a 44 year old female with a past medical history significant for anemia, tobacco abuse, and TIA; who states symptoms started approximately three days ago with a scratchy throat and progress into a cough with thick white sputum production. She felt like she was just getting a cold so she tried over-the-counter medications, but symptoms progressed and she started wheezing and having more congestion. She tried using a breathing treatment then a leftover inhaler she had without relief of symptoms. Denies any chest or calf pain. She reports coughing to the point where she vomited. Upon admission in to the emergency department patient was checked with chest x-ray which showed the possibility bronchitis. Patient also was checked with a D dimer because of persistent tachycardia despite fluids, and was elevated at 1.81, Therefore a CT angiogram was also ordered but showed no acute signs of a PE.  Assessment & Plan   Patient admitted earlier this morning Dr. Fuller Plan. See full H&P for details. Agree with current Assessment and plan.  Acute COPD exacerbation  -Patient reported URI symptoms, ?underlying infection -CXR: Progressive interstitial prominence, possible bronchitis evidence of pneumonia -Chest CTA: No evidence of PE, patchy bilateral airspace opacification may reflect pulmonary edema or atypical pneumonia -Continue levaquin, solumedrol, mucinex, nebs  Iron deficiency anemia -Hb 7.6 -Baseline hemoglobin approximately 9 -Continue to  monitor CBC, will transfuse if Hb<7 -Iron <10 in 08/2015 -Continue iron supplementation  Sinus tachycardia  -Possibly secondary to COPD Ex vs anemia -Continue to monitor  Elevated D-dimer -CTA chest negative for PE  Hypokalemia  -Resolved, continue to monitor BMP  Tobacco use disorder -Smoking cessation discussed  Thyroid density -CTA chest noted 1.7 cm hypodensity within the right thyroid lobe  Hyperlipidemia -Statin  History of stroke -Continue Plavix, statin  Code Status: Full  Family Communication: Family at bedside   Disposition Plan: Admitted   Time Spent in minutes   30 minutes  Procedures  None  Consults   None  DVT Prophylaxis  Lovenox  [name] D.O. on 11/01/2015 at 1:27 PM  Between 7am to 7pm - Pager - [phone]  After 7pm go to [email or web address] - password [email or web address]  And look for the night coverage person covering for me after hours  [facility] Office  [phone]

## 2015-11-02 LAB — BASIC METABOLIC PANEL
Anion gap: 11 (ref 5–15)
BUN: 15 mg/dL (ref 6–20)
CALCIUM: 9.2 mg/dL (ref 8.9–10.3)
CO2: 23 mmol/L (ref 22–32)
CREATININE: 1.02 mg/dL — AB (ref 0.44–1.00)
Chloride: 108 mmol/L (ref 101–111)
GFR calc non Af Amer: >60 mL/min (ref >60)
Glucose, Bld: 205 mg/dL — ABNORMAL HIGH (ref 65–99)
Potassium: 3.8 mmol/L (ref 3.5–5.1)
SODIUM: 142 mmol/L (ref 135–145)

## 2015-11-02 LAB — CBC
HCT: 27.4 % — ABNORMAL LOW (ref 36.0–46.0)
Hemoglobin: 7.5 g/dL — ABNORMAL LOW (ref 12.0–15.0)
MCH: 17.5 pg — AB (ref 26.0–34.0)
MCHC: 27.4 g/dL — AB (ref 30.0–36.0)
MCV: 64 fL — ABNORMAL LOW (ref 78.0–100.0)
PLATELETS: 356 10*3/uL (ref 150–400)
RBC: 4.28 MIL/uL (ref 3.87–5.11)
RDW: 22.2 % — AB (ref 11.5–15.5)
WBC: 18.3 10*3/uL — ABNORMAL HIGH (ref 4.0–10.5)

## 2015-11-02 MED ORDER — GUAIFENESIN ER 600 MG PO TB12: 600.0000 mg | ORAL_TABLET | Freq: Two times a day (BID) | ORAL | Status: DC

## 2015-11-02 MED ORDER — LEVOFLOXACIN 750 MG PO TABS: 750.0000 mg | ORAL_TABLET | Freq: Every day | ORAL | Status: DC

## 2015-11-02 MED ORDER — PREDNISONE 10 MG PO TABS: ORAL_TABLET | ORAL | Status: DC

## 2015-11-02 MED ORDER — BENZONATATE 100 MG PO CAPS: 100.0000 mg | ORAL_CAPSULE | Freq: Three times a day (TID) | ORAL | Status: DC | PRN

## 2015-11-02 NOTE — Discharge Instructions (Signed)
Chronic Obstructive Pulmonary Disease Chronic obstructive pulmonary disease (COPD) is a common lung condition in which airflow from the lungs is limited. COPD is a general term that can be used to describe many different lung problems that limit airflow, including both chronic bronchitis and emphysema. If you have COPD, your lung function will probably never return to normal, but there are measures you can take to improve lung function and make yourself feel better. CAUSES   Smoking (common).  Exposure to secondhand smoke.  Genetic problems.  Chronic inflammatory lung diseases or recurrent infections. SYMPTOMS  Shortness of breath, especially with physical activity.  Deep, persistent (chronic) cough with a large amount of thick mucus.  Wheezing.  Rapid breaths (tachypnea).  Gray or bluish discoloration (cyanosis) of the skin, especially in your fingers, toes, or lips.  Fatigue.  Weight loss.  Frequent infections or episodes when breathing symptoms become much worse (exacerbations).  Chest tightness. DIAGNOSIS Your health care provider will take a medical history and perform a physical examination to diagnose COPD. Additional tests for COPD may include:  Lung (pulmonary) function tests.  Chest X-ray.  CT scan.  Blood tests. TREATMENT  Treatment for COPD may include:  Inhaler and nebulizer medicines. These help manage the symptoms of COPD and make your breathing more comfortable.  Supplemental oxygen. Supplemental oxygen is only helpful if you have a low oxygen level in your blood.  Exercise and physical activity. These are beneficial for nearly all people with COPD.  Lung surgery or transplant.  Nutrition therapy to gain weight, if you are underweight.  Pulmonary rehabilitation. This may involve working with a team of health care providers and specialists, such as respiratory, occupational, and physical therapists. HOME CARE INSTRUCTIONS  Take all medicines  (inhaled or pills) as directed by your health care provider.  Avoid over-the-counter medicines or cough syrups that dry up your airway (such as antihistamines) and slow down the elimination of secretions unless instructed otherwise by your health care provider.  If you are a smoker, the most important thing that you can do is stop smoking. Continuing to smoke will cause further lung damage and breathing trouble. Ask your health care provider for help with quitting smoking. He or she can direct you to community resources or hospitals that provide support.  Avoid exposure to irritants such as smoke, chemicals, and fumes that aggravate your breathing.  Use oxygen therapy and pulmonary rehabilitation if directed by your health care provider. If you require home oxygen therapy, ask your health care provider whether you should purchase a pulse oximeter to measure your oxygen level at home.  Avoid contact with individuals who have a contagious illness.  Avoid extreme temperature and humidity changes.  Eat healthy foods. Eating smaller, more frequent meals and resting before meals may help you maintain your strength.  Stay active, but balance activity with periods of rest. Exercise and physical activity will help you maintain your ability to do things you want to do.  Preventing infection and hospitalization is very important when you have COPD. Make sure to receive all the vaccines your health care provider recommends, especially the pneumococcal and influenza vaccines. Ask your health care provider whether you need a pneumonia vaccine.  Learn and use relaxation techniques to manage stress.  Learn and use controlled breathing techniques as directed by your health care provider. Controlled breathing techniques include:  Pursed lip breathing. Start by breathing in (inhaling) through your nose for 1 second. Then, purse your lips as if you were   going to whistle and breathe out (exhale) through the  pursed lips for 2 seconds.  Diaphragmatic breathing. Start by putting one hand on your abdomen just above your waist. Inhale slowly through your nose. The hand on your abdomen should move out. Then purse your lips and exhale slowly. You should be able to feel the hand on your abdomen moving in as you exhale.  Learn and use controlled coughing to clear mucus from your lungs. Controlled coughing is a series of short, progressive coughs. The steps of controlled coughing are: 1. Lean your head slightly forward. 2. Breathe in deeply using diaphragmatic breathing. 3. Try to hold your breath for 3 seconds. 4. Keep your mouth slightly open while coughing twice. 5. Spit any mucus out into a tissue. 6. Rest and repeat the steps once or twice as needed. SEEK MEDICAL CARE IF:  You are coughing up more mucus than usual.  There is a change in the color or thickness of your mucus.  Your breathing is more labored than usual.  Your breathing is faster than usual. SEEK IMMEDIATE MEDICAL CARE IF:  You have shortness of breath while you are resting.  You have shortness of breath that prevents you from:  Being able to talk.  Performing your usual physical activities.  You have chest pain lasting longer than 5 minutes.  Your skin color is more cyanotic than usual.  You measure low oxygen saturations for longer than 5 minutes with a pulse oximeter. MAKE SURE YOU:  Understand these instructions.  Will watch your condition.  Will get help right away if you are not doing well or get worse.   This information is not intended to replace advice given to you by your health care provider. Make sure you discuss any questions you have with your health care provider.   Document Released: 08/04/2005 Document Revised: 11/15/2014 Document Reviewed: 06/21/2013 Elsevier Interactive Patient Education 2016 [employer]  

## 2015-11-02 NOTE — Progress Notes (Signed)
[name] to be D/C'd Home per MD order. Discussed with the patient and all questions fully answered.  VSS, Skin clean, dry and intact without evidence of skin break down, no evidence of skin tears noted. IV catheter discontinued intact. Site without signs and symptoms of complications. Dressing and pressure applied.  An After Visit Summary was printed and given to the patient. Prescriptions sent to [facility]  D/c education completed with patient/family including follow up instructions, medication list, d/c activities limitations if indicated, with other d/c instructions as indicated by MD - patient able to verbalize understanding, all questions fully answered.   Patient instructed to return to ED, call 911, or call MD for any changes in condition.   Patient escorted via walking with family, and D/C home via private auto with family.

## 2015-11-02 NOTE — Discharge Summary (Signed)
Physician Discharge Summary  [name] [id number] DOB: Mar 25, 1971 DOA: 10/31/2015  PCP: Minerva Ends, MD  Admit date: 10/31/2015 Discharge date: 11/02/2015  Time spent: 45 minutes  Recommendations for Outpatient Follow-up:  Patient will be discharged to home.  Patient will need to follow up with primary care provider within one week of discharge, repeat CBC.  Patient should continue medications as prescribed.  Patient should follow a regular diet.   Discharge Diagnoses:  Acute COPD exacerbation Iron deficiency anemia Sinus tachycardia Elevated d-dimer Hypokalemia Tobacco use disorder Thyroid density Hyperlipidemia History of CVA  Discharge Condition: Stable  Diet recommendation: Regular  Filed Weights   10/31/15 2108 11/02/15 0614  Weight: 86.183 kg (190 lb) 87.8 kg (193 lb 9 oz)    History of present illness:  on 11/01/2015 by Dr. Fuller Rowland [name] is a 44 year old female with a past medical history significant for anemia, tobacco abuse, and TIA; who states symptoms started approximately three days ago with a scratchy throat and progress into a cough with thick white sputum production. She felt like she was just getting a cold so she tried over-the-counter medications, but symptoms progressed and she started wheezing and having more congestion. She tried using a breathing treatment then a leftover inhaler she had without relief of symptoms. Denies any chest or calf pain. She reports coughing to the point where she vomited. Upon admission in to the emergency department patient was checked with chest x-ray which showed the possibility bronchitis. Patient also was checked with a D dimer because of persistent tachycardia despite fluids, and was elevated at 1.81, Therefore a CT angiogram was also ordered but showed no acute signs of a PE.  Hospital Course:  Acute COPD exacerbation  -Patient reported URI symptoms, ?underlying infection -Breathing and  symptoms improving -CXR: Progressive interstitial prominence, possible bronchitis evidence of pneumonia -Chest CTA: No evidence of PE, patchy bilateral airspace opacification may reflect pulmonary edema or atypical pneumonia -Continue levaquin, prednisone, mucinex  Iron deficiency anemia -Hb 7.5 -Baseline hemoglobin approximately 9 -Continue to monitor CBC, will transfuse if Hb<7 -Iron <10 in 08/2015 -Continue iron supplementation -Repeat CBC in one week  Sinus tachycardia  -Possibly secondary to COPD Ex vs anemia -TSH 1.040  Elevated D-dimer -CTA chest negative for PE  Hypokalemia  -Resolved, continue to monitor BMP  Tobacco use disorder -Smoking cessation discussed  Thyroid density -CTA chest noted 1.7 cm hypodensity within the right thyroid lobe  Hyperlipidemia -Statin  History of stroke -Continue Plavix, statin  Procedures: None  Consultations: None  Discharge Exam: Filed Vitals:   11/02/15 0614 11/02/15 0757  BP: 137/67   Pulse: 98 96  Temp: 99.2 F (37.3 C)   Resp: 16 16     General: Well developed, well nourished, NAD, appears stated age  HEENT: NCAT, mucous membranes moist.  Cardiovascular: S1 S2 auscultated, RRR, no murmurs  Respiratory: Diminished but clear, no wheezing noted  Abdomen: Soft, nontender, nondistended, + bowel sounds  Extremities: warm dry without cyanosis clubbing or edema  Neuro: AAOx3, nonfocal  Psych: Normal affect and demeanor   Discharge Instructions      Discharge Instructions    Discharge instructions    Complete by:  As directed   Patient will be discharged to home.  Patient will need to follow up with primary care provider within one week of discharge, repeat CBC.  Patient should continue medications as prescribed.  Patient should follow a regular diet.  Medication List    TAKE these medications        albuterol 108 (90 BASE) MCG/ACT inhaler  Commonly known as:  PROVENTIL HFA;VENTOLIN HFA    Inhale 2 puffs into the lungs every 6 (six) hours as needed for wheezing or shortness of breath.     atorvastatin 40 MG tablet  Commonly known as:  LIPITOR  Take 1 tablet (40 mg total) by mouth daily.     benzonatate 100 MG capsule  Commonly known as:  TESSALON  Take 1 capsule (100 mg total) by mouth 3 (three) times daily as needed for cough.     clopidogrel 75 MG tablet  Commonly known as:  PLAVIX  Take 1 tablet (75 mg total) by mouth daily.     ferrous sulfate 325 (65 FE) MG tablet  Take 1 tablet (325 mg total) by mouth 3 (three) times daily with meals.     fluticasone 50 MCG/ACT nasal spray  Commonly known as:  FLONASE  Place 2 sprays into both nostrils daily.     gabapentin 100 MG capsule  Commonly known as:  NEURONTIN  Take 1 capsule (100 mg total) by mouth 3 (three) times daily.     guaiFENesin 600 MG 12 hr tablet  Commonly known as:  MUCINEX  Take 1 tablet (600 mg total) by mouth 2 (two) times daily.     hydrOXYzine 25 MG capsule  Commonly known as:  VISTARIL  Take 1 capsule (25 mg total) by mouth 3 (three) times daily as needed for itching.     levofloxacin 750 MG tablet  Commonly known as:  LEVAQUIN  Take 1 tablet (750 mg total) by mouth daily.  Start taking on:  11/03/2015     multivitamin with minerals Tabs tablet  Take 1 tablet by mouth daily.     pantoprazole 40 MG tablet  Commonly known as:  PROTONIX  Take 1 tablet (40 mg total) by mouth daily. Take 30-60 min before first meal of the day     predniSONE 10 MG tablet  Commonly known as:  DELTASONE  Take 40mg  (4 tabs) x 3 days, then taper to 30mg  (3 tabs) x 3 days, then 20mg  (2 tabs) x 3days, then 10mg  (1 tab) x 3days, then Stop     ranitidine 150 MG tablet  Commonly known as:  ZANTAC  Take 150 mg by mouth daily.       Allergies  Allergen Reactions  . Shellfish Allergy Itching and Swelling  . Latex Itching and Rash   Follow-up Information    Follow up with Minerva Ends, MD. Schedule an  appointment as soon as possible for a visit in 1 week.   Specialty:  Family Medicine   Why:  Hospital follow up, repeat CBC   Contact information:   [place] [id number] [phone]        The results of significant diagnostics from this hospitalization (including imaging, microbiology, ancillary and laboratory) are listed below for reference.    Significant Diagnostic Studies: Dg Chest 2 View  10/31/2015  CLINICAL DATA:  Productive cough with wheezing for 3 days.  Smoker. EXAM: CHEST  2 VIEW COMPARISON:  03/11/2015 and 09/26/2014 radiographs. FINDINGS: The heart size and mediastinal contours are stable. Interstitial prominence has increased from the prior studies. The pulmonary vasculature appears normal. There is no confluent airspace opacity, hyperinflation or significant pleural effusion. Patient is status post lower cervical fusion and cholecystectomy. IMPRESSION: Progressive interstitial prominence compared with  priors, suggesting possible bronchitis. No evidence of pneumonia. Electronically Signed   By: Richardean Sale M.D.   On: 10/31/2015 23:14   Ct Angio Chest Pe W/cm &/or Wo Cm  11/01/2015  CLINICAL DATA:  Acute onset of productive cough and wheezing. Initial encounter. EXAM: CT ANGIOGRAPHY CHEST WITH CONTRAST TECHNIQUE: Multidetector CT imaging of the chest was performed using the standard protocol during bolus administration of intravenous contrast. Multiplanar CT image reconstructions and MIPs were obtained to evaluate the vascular anatomy. CONTRAST:  156mL OMNIPAQUE IOHEXOL 350 MG/ML SOLN COMPARISON:  Chest radiograph performed 10/31/2015 FINDINGS: There is no evidence of pulmonary embolus. Patchy bilateral central airspace opacification is noted. This may reflect pulmonary edema or atypical pneumonia. No pleural effusion or pneumothorax is seen. No masses are identified; no abnormal focal contrast enhancement is seen. The mediastinum is unremarkable in  appearance. A prominent right hilar node is noted, measuring 1.4 cm in short axis. No mediastinal lymphadenopathy is appreciated. No pericardial effusion is identified. The great vessels are grossly unremarkable in appearance. No axillary lymphadenopathy is seen. A 1.7 cm hypodensity is noted within the right thyroid lobe. The visualized portions of the liver and spleen are unremarkable. The patient is status post cholecystectomy, with clips noted at the gallbladder fossa. The visualized portions of the pancreas, adrenal glands and kidneys are within normal limits. No acute osseous abnormalities are seen. Review of the MIP images confirms the above findings. IMPRESSION: 1. No evidence of pulmonary embolus. 2. Patchy bilateral central airspace opacification may reflect pulmonary edema or atypical pneumonia. 3. Prominent right hilar node, measuring 1.4 cm in short axis. 4. 1.7 cm hypodensity within the right thyroid lobe. Consider further evaluation with thyroid ultrasound. If patient is clinically hyperthyroid, consider nuclear medicine thyroid uptake and scan. Electronically Signed   By: Garald Balding M.D.   On: 11/01/2015 02:33    Microbiology: No results found for this or any previous visit (from the past 240 hour(s)).   Labs: Basic Metabolic Panel:  Recent Labs Lab 10/31/15 2247 11/01/15 1206 11/02/15 0817  NA 140 139 142  K 3.1* 4.0 3.8  CL 105 108 108  CO2 23 21* 23  GLUCOSE 139* 174* 205*  BUN 11 10 15   CREATININE 1.02* 0.83 1.02*  CALCIUM 8.4* 8.5* 9.2   Liver Function Tests:  Recent Labs Lab 10/31/15 2247 11/01/15 1206  AST 40 29  ALT 19 18  ALKPHOS 66 69  BILITOT 0.2* 0.4  PROT 6.6 6.8  ALBUMIN 3.4* 3.2*   No results for input(s): LIPASE, AMYLASE in the last 168 hours. No results for input(s): AMMONIA in the last 168 hours. CBC:  Recent Labs Lab 10/31/15 2247 11/01/15 1206 11/02/15 0817  WBC 10.3 13.0* 18.3*  NEUTROABS 7.3  --   --   HGB 8.0* 7.6* 7.5*  HCT  28.5* 26.5* 27.4*  MCV 63.8* 63.5* 64.0*  PLT 270 297 356   Cardiac Enzymes: No results for input(s): CKTOTAL, CKMB, CKMBINDEX, TROPONINI in the last 168 hours. BNP: BNP (last 3 results) No results for input(s): BNP in the last 8760 hours.  ProBNP (last 3 results) No results for input(s): PROBNP in the last 8760 hours.  CBG: No results for input(s): GLUCAP in the last 168 hours.     SignedLODA, OLBRICH  [facility] 11/02/2015, 10:06 AM

## 2015-11-04 ENCOUNTER — Encounter: Payer: Self-pay | Admitting: Family Medicine

## 2015-11-04 ENCOUNTER — Ambulatory Visit: Payer: Managed Care, Other (non HMO) | Attending: Family Medicine | Admitting: Family Medicine

## 2015-11-04 VITALS — BP 134/93 | HR 101 | Temp 98.5°F | Resp 16 | Ht 62.5 in | Wt 197.0 lb

## 2015-11-04 DIAGNOSIS — Z91013 Allergy to seafood: Secondary | ICD-10-CM | POA: Diagnosis not present

## 2015-11-04 DIAGNOSIS — Z79899 Other long term (current) drug therapy: Secondary | ICD-10-CM | POA: Insufficient documentation

## 2015-11-04 DIAGNOSIS — Z8673 Personal history of transient ischemic attack (TIA), and cerebral infarction without residual deficits: Secondary | ICD-10-CM | POA: Diagnosis not present

## 2015-11-04 DIAGNOSIS — Z91040 Latex allergy status: Secondary | ICD-10-CM | POA: Insufficient documentation

## 2015-11-04 DIAGNOSIS — R5381 Other malaise: Secondary | ICD-10-CM | POA: Diagnosis not present

## 2015-11-04 DIAGNOSIS — R030 Elevated blood-pressure reading, without diagnosis of hypertension: Secondary | ICD-10-CM | POA: Diagnosis not present

## 2015-11-04 DIAGNOSIS — IMO0001 Reserved for inherently not codable concepts without codable children: Secondary | ICD-10-CM

## 2015-11-04 DIAGNOSIS — K219 Gastro-esophageal reflux disease without esophagitis: Secondary | ICD-10-CM | POA: Insufficient documentation

## 2015-11-04 DIAGNOSIS — Z87891 Personal history of nicotine dependence: Secondary | ICD-10-CM | POA: Insufficient documentation

## 2015-11-04 DIAGNOSIS — R9389 Abnormal findings on diagnostic imaging of other specified body structures: Secondary | ICD-10-CM

## 2015-11-04 DIAGNOSIS — R946 Abnormal results of thyroid function studies: Secondary | ICD-10-CM | POA: Insufficient documentation

## 2015-11-04 DIAGNOSIS — J189 Pneumonia, unspecified organism: Secondary | ICD-10-CM | POA: Diagnosis not present

## 2015-11-04 MED ORDER — BENZONATATE 100 MG PO CAPS: 100.0000 mg | ORAL_CAPSULE | Freq: Three times a day (TID) | ORAL | Status: DC | PRN

## 2015-11-04 MED ORDER — ALBUTEROL SULFATE HFA 108 (90 BASE) MCG/ACT IN AERS: 2.0000 | INHALATION_SPRAY | Freq: Four times a day (QID) | RESPIRATORY_TRACT | Status: DC | PRN

## 2015-11-04 MED ORDER — GUAIFENESIN ER 600 MG PO TB12: 600.0000 mg | ORAL_TABLET | Freq: Two times a day (BID) | ORAL | Status: DC

## 2015-11-04 MED ORDER — METHYLPREDNISOLONE SODIUM SUCC 125 MG IJ SOLR
62.5000 mg | Freq: Once | INTRAMUSCULAR | Status: AC
  Administered 2015-11-04: 62.5 mg via INTRAMUSCULAR

## 2015-11-04 MED ORDER — LEVOFLOXACIN 750 MG PO TABS: 750.0000 mg | ORAL_TABLET | Freq: Every day | ORAL | Status: DC

## 2015-11-04 MED ORDER — IPRATROPIUM BROMIDE 0.02 % IN SOLN
0.5000 mg | Freq: Once | RESPIRATORY_TRACT | Status: AC
  Administered 2015-11-04: 0.5 mg via RESPIRATORY_TRACT

## 2015-11-04 MED ORDER — PREDNISONE 10 MG PO TABS: ORAL_TABLET | ORAL | Status: DC

## 2015-11-04 MED ORDER — PANTOPRAZOLE SODIUM 40 MG PO TBEC: 40.0000 mg | DELAYED_RELEASE_TABLET | Freq: Every day | ORAL | Status: DC

## 2015-11-04 NOTE — Patient Instructions (Signed)

## 2015-11-04 NOTE — Progress Notes (Signed)
Patient here to follow up after recent hospital stay She reports feeling poorly and has not picked up her discharge medications She called EMS last night who gave her a breathing treatment and some tylenol for her fever She reports that our pharmacy does not take her insurance She needs to work note

## 2015-11-04 NOTE — Progress Notes (Signed)
Subjective:  Patient ID: [name], female    DOB: 12-15-1970  Age: 44 y.o. MRN: [id number]  CC: Hospitalization Follow-up   HPI [name] presents  Needing refills of her medication. She was recently hospitalized at [facility] from 11/01/2015 to 11/02/15 for atypical pneumonia vs COPD exacerbation  and was placed on Levaquin, prednisone and antitussives which she never got to pick up because the pharmacy was closed (the patient denies a history of COPD but does admit to a history of smoking and states she quit at the time of hospitalization) During her hospitalization she had a chest x-ray which revealed progressive interstitial prominence, possible bronchitis, no evidence of pneumonia but a chest CT angiogram revealed no evidence of PE, patchy bilateral airspace opacification which may reflect pulmonary edema or atypical pneumonia. She complains of cough, wheezing ,  Malaise and had to call the EMS yesterday for which she received a nebulizer treatment. She also states that the pharmacy in-house does not take her [employer].   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  Past Medical History  Diagnosis Date  . Stroke ([place]) 05/2013  . History of TIAs   . Pneumonia   . Headache(784.0)   . Numbness     Right hand  . Anemia   . History of blood transfusion     Hx; of in 1991 after delivery    Past Surgical History  Procedure Laterality Date  . Cesarean section      x 1 with 5th pregnancy  . Cholecystectomy    . Tee without cardioversion N/A 05/21/2013    Procedure: TRANSESOPHAGEAL ECHOCARDIOGRAM (TEE);  Surgeon: Sueanne Margarita, MD;  Location: [facility] ENDOSCOPY;  Service: Cardiovascular;  Laterality: N/A;  . Tubal ligation    . Anterior cervical decomp/discectomy fusion N/A 03/06/2014    Procedure: ANTERIOR CERVICAL DECOMPRESSION/DISCECTOMY FUSION 2 LEVELS four/five, five/six;  Surgeon: Eustace Moore, MD;  Location: [facility] NEURO ORS;  Service:  Neurosurgery;  Laterality: N/A;    Allergies  Allergen Reactions  . Shellfish Allergy Itching and Swelling  . Latex Itching and Rash    Social History   Social History  . Marital Status: Divorced    Spouse Name: N/A  . Number of Children: 7  . Years of Education: GEd   Occupational History  . CNA    Social History Main Topics  . Smoking status: Former Smoker -- 0.00 packs/day for 26 years    Types: Cigarettes    Quit date: 10/09/2015  . Smokeless tobacco: Never Used  . Alcohol Use: No  . Drug Use: No  . Sexual Activity: Not on file   Other Topics Concern  . Not on file   Social History Narrative   Patient lives at home with her family    Patient drinks soda     Outpatient Prescriptions Prior to Visit  Medication Sig Dispense Refill  . Multiple Vitamin (MULTIVITAMIN WITH MINERALS) TABS tablet Take 1 tablet by mouth daily. Reported on 11/04/2015    . atorvastatin (LIPITOR) 40 MG tablet Take 1 tablet (40 mg total) by mouth daily. (Patient not taking: Reported on 11/04/2015) 90 tablet 3  . clopidogrel (PLAVIX) 75 MG tablet Take 1 tablet (75 mg total) by mouth daily. (Patient not taking: Reported on 11/04/2015) 90 tablet 3  . ferrous sulfate 325 (65 FE) MG tablet Take 1 tablet (325 mg total) by mouth 3 (three) times daily with  meals. (Patient not taking: Reported on 11/04/2015) 90 tablet 5  . fluticasone (FLONASE) 50 MCG/ACT nasal spray Place 2 sprays into both nostrils daily. (Patient not taking: Reported on 11/04/2015) 16 g 2  . gabapentin (NEURONTIN) 100 MG capsule Take 1 capsule (100 mg total) by mouth 3 (three) times daily. (Patient not taking: Reported on 11/04/2015) 90 capsule 3  . hydrOXYzine (VISTARIL) 25 MG capsule Take 1 capsule (25 mg total) by mouth 3 (three) times daily as needed for itching. (Patient not taking: Reported on 11/04/2015) 30 capsule 2  . ranitidine (ZANTAC) 150 MG tablet Take 150 mg by mouth daily. Reported on 11/04/2015    . albuterol  (PROVENTIL HFA;VENTOLIN HFA) 108 (90 BASE) MCG/ACT inhaler Inhale 2 puffs into the lungs every 6 (six) hours as needed for wheezing or shortness of breath.    . benzonatate (TESSALON) 100 MG capsule Take 1 capsule (100 mg total) by mouth 3 (three) times daily as needed for cough. 20 capsule 0  . guaiFENesin (MUCINEX) 600 MG 12 hr tablet Take 1 tablet (600 mg total) by mouth 2 (two) times daily.    Marland Kitchen levofloxacin (LEVAQUIN) 750 MG tablet Take 1 tablet (750 mg total) by mouth daily. 3 tablet 0  . pantoprazole (PROTONIX) 40 MG tablet Take 1 tablet (40 mg total) by mouth daily. Take 30-60 min before first meal of the day 30 tablet 5  . predniSONE (DELTASONE) 10 MG tablet Take 40mg  (4 tabs) x 3 days, then taper to 30mg  (3 tabs) x 3 days, then 20mg  (2 tabs) x 3days, then 10mg  (1 tab) x 3days, then Stop 30 tablet 0   No facility-administered medications prior to visit.    ROS Review of Systems  Constitutional: Positive for fatigue. Negative for activity change and appetite change.  HENT: Negative for congestion, sinus pressure and sore throat.   Eyes: Negative for visual disturbance.  Respiratory:       See history of present illness  Cardiovascular: Negative for chest pain and palpitations.  Gastrointestinal: Negative for abdominal pain, constipation and abdominal distention.  Endocrine: Negative for polydipsia.  Genitourinary: Negative for dysuria and frequency.  Musculoskeletal: Negative for back pain and arthralgias.  Skin: Negative for rash.  Neurological: Negative for tremors, light-headedness and numbness.  Hematological: Does not bruise/bleed easily.  Psychiatric/Behavioral: Negative for behavioral problems and agitation.    Objective:  BP 134/93 mmHg  Pulse 101  Temp(Src) 98.5 F (36.9 C)  Resp 16  Ht 5' 2.5" (1.588 m)  Wt 197 lb (89.359 kg)  BMI 35.44 kg/m2  SpO2 94%  LMP 10/25/2015  BP/Weight 11/04/2015 11/02/2015 [id number]  Systolic BP Q000111Q 0000000 [id number]  Diastolic BP 93 67  72  Wt. (Lbs) 197 193.56 196  BMI 35.44 34.3 34.17      Physical Exam  Constitutional: She is oriented to person, place, and time. She appears well-developed and well-nourished. No distress.  HENT:  Head: Normocephalic.  Right Ear: External ear normal.  Left Ear: External ear normal.  Nose: Nose normal.  Mouth/Throat: Oropharynx is clear and moist.  Eyes: Conjunctivae and EOM are normal. Pupils are equal, round, and reactive to light.  Neck: Normal range of motion. No JVD present.  Cardiovascular: Regular rhythm, normal heart sounds and intact distal pulses.  Tachycardia present.  Exam reveals no gallop.   No murmur heard. Pulmonary/Chest: Effort normal. No respiratory distress. She has wheezes. She has no rales. She exhibits no tenderness.  Abdominal: Soft. Bowel sounds are normal. She exhibits no  distension and no mass. There is no tenderness.  Musculoskeletal: Normal range of motion. She exhibits no edema or tenderness.  Neurological: She is alert and oriented to person, place, and time. She has normal reflexes.  Skin: Skin is warm and dry. She is not diaphoretic.  Psychiatric: She has a normal mood and affect.   CLINICAL DATA: Acute onset of productive cough and wheezing. Initial encounter.  EXAM: CT ANGIOGRAPHY CHEST WITH CONTRAST  TECHNIQUE: Multidetector CT imaging of the chest was performed using the standard protocol during bolus administration of intravenous contrast. Multiplanar CT image reconstructions and MIPs were obtained to evaluate the vascular anatomy.  CONTRAST: 158mL OMNIPAQUE IOHEXOL 350 MG/ML SOLN  COMPARISON: Chest radiograph performed 10/31/2015  FINDINGS: There is no evidence of pulmonary embolus.  Patchy bilateral central airspace opacification is noted. This may reflect pulmonary edema or atypical pneumonia. No pleural effusion or pneumothorax is seen. No masses are identified; no abnormal focal contrast enhancement is seen.  The  mediastinum is unremarkable in appearance. A prominent right hilar node is noted, measuring 1.4 cm in short axis. No mediastinal lymphadenopathy is appreciated. No pericardial effusion is identified. The great vessels are grossly unremarkable in appearance. No axillary lymphadenopathy is seen. A 1.7 cm hypodensity is noted within the right thyroid lobe.  The visualized portions of the liver and spleen are unremarkable. The patient is status post cholecystectomy, with clips noted at the gallbladder fossa. The visualized portions of the pancreas, adrenal glands and kidneys are within normal limits.  No acute osseous abnormalities are seen.  Review of the MIP images confirms the above findings.  IMPRESSION: 1. No evidence of pulmonary embolus. 2. Patchy bilateral central airspace opacification may reflect pulmonary edema or atypical pneumonia. 3. Prominent right hilar node, measuring 1.4 cm in short axis. 4. 1.7 cm hypodensity within the right thyroid lobe. Consider further evaluation with thyroid ultrasound. If patient is clinically hyperthyroid, consider nuclear medicine thyroid uptake and scan.   Electronically Signed  By: Garald Balding M.D.  On: 11/01/2015 02:33  Assessment & Plan:   1. Gastroesophageal reflux disease, esophagitis presence not specified Refilled PPI - pantoprazole (PROTONIX) 40 MG tablet; Take 1 tablet (40 mg total) by mouth daily. Take 30-60 min before first meal of the day (Patient not taking: Reported on 11/04/2015)  Dispense: 30 tablet; Refill: 2  2. CAP (community acquired pneumonia) Currently symptomatic due to not obtaining medications; will need reassessment for improvement at next visit - ipratropium (ATROVENT) nebulizer solution 0.5 mg; Take 2.5 mLs (0.5 mg total) by nebulization once. - methylPREDNISolone sodium succinate (SOLU-MEDROL) 125 mg/2 mL injection 62.5 mg; Inject 1 mL (62.5 mg total) into the muscle once. - levofloxacin  (LEVAQUIN) 750 MG tablet; Take 1 tablet (750 mg total) by mouth daily. (Patient not taking: Reported on 11/04/2015)  Dispense: 3 tablet; Refill: 0 - predniSONE (DELTASONE) 10 MG tablet; Take 40mg  (4 tabs) x 3 days, then taper to 30mg  (3 tabs) x 3 days, then 20mg  (2 tabs) x 3days, then 10mg  (1 tab) x 3days, then Stop (Patient not taking: Reported on 11/04/2015)  Dispense: 30 tablet; Refill: 0 - guaiFENesin (MUCINEX) 600 MG 12 hr tablet; Take 1 tablet (600 mg total) by mouth 2 (two) times daily. (Patient not taking: Reported on 11/04/2015)  Dispense: 20 tablet; Refill: 0 - benzonatate (TESSALON) 100 MG capsule; Take 1 capsule (100 mg total) by mouth 3 (three) times daily as needed for cough. (Patient not taking: Reported on 11/04/2015)  Dispense: 20 capsule; Refill: 0 -  albuterol (PROVENTIL HFA;VENTOLIN HFA) 108 (90 BASE) MCG/ACT inhaler; Inhale 2 puffs into the lungs every 6 (six) hours as needed for wheezing or shortness of breath. (Patient not taking: Reported on 11/04/2015)  Dispense: 1 Inhaler; Refill: 1  3. Elevated blood pressure Will hold off on regimen changes as current elevation may be due to current acute illness.  4. Thyroid hypodensity: She will need workup with thyroid ultrasound. Will defer this to PCP.  Meds ordered this encounter  Medications  . pantoprazole (PROTONIX) 40 MG tablet    Sig: Take 1 tablet (40 mg total) by mouth daily. Take 30-60 min before first meal of the day    Dispense:  30 tablet    Refill:  2  . ipratropium (ATROVENT) nebulizer solution 0.5 mg    Sig:   . methylPREDNISolone sodium succinate (SOLU-MEDROL) 125 mg/2 mL injection 62.5 mg    Sig:   . levofloxacin (LEVAQUIN) 750 MG tablet    Sig: Take 1 tablet (750 mg total) by mouth daily.    Dispense:  3 tablet    Refill:  0  . predniSONE (DELTASONE) 10 MG tablet    Sig: Take 40mg  (4 tabs) x 3 days, then taper to 30mg  (3 tabs) x 3 days, then 20mg  (2 tabs) x 3days, then 10mg  (1 tab) x 3days, then Stop     Dispense:  30 tablet    Refill:  0  . guaiFENesin (MUCINEX) 600 MG 12 hr tablet    Sig: Take 1 tablet (600 mg total) by mouth 2 (two) times daily.    Dispense:  20 tablet    Refill:  0  . benzonatate (TESSALON) 100 MG capsule    Sig: Take 1 capsule (100 mg total) by mouth 3 (three) times daily as needed for cough.    Dispense:  20 capsule    Refill:  0  . albuterol (PROVENTIL HFA;VENTOLIN HFA) 108 (90 BASE) MCG/ACT inhaler    Sig: Inhale 2 puffs into the lungs every 6 (six) hours as needed for wheezing or shortness of breath.    Dispense:  1 Inhaler    Refill:  1    Follow-up: Return in about 2 weeks (around 11/18/2015), or if symptoms worsen or fail to improve, for Follow-up with PCP- Dr Adrian Blackwater.   Arnoldo Morale MD

## 2015-11-05 MED ORDER — IOHEXOL 350 MG/ML SOLN
100.0000 mL | Freq: Once | INTRAVENOUS | Status: AC | PRN
  Administered 2015-11-01: 100 mL via INTRAVENOUS

## 2015-11-18 ENCOUNTER — Encounter: Payer: Self-pay | Admitting: Family Medicine

## 2015-11-18 ENCOUNTER — Ambulatory Visit: Payer: Managed Care, Other (non HMO) | Attending: Family Medicine | Admitting: Family Medicine

## 2015-11-18 VITALS — BP 105/72 | HR 87 | Temp 98.4°F | Resp 16 | Ht 63.5 in | Wt 196.0 lb

## 2015-11-18 DIAGNOSIS — Z7902 Long term (current) use of antithrombotics/antiplatelets: Secondary | ICD-10-CM | POA: Diagnosis not present

## 2015-11-18 DIAGNOSIS — Z87891 Personal history of nicotine dependence: Secondary | ICD-10-CM | POA: Diagnosis not present

## 2015-11-18 DIAGNOSIS — J449 Chronic obstructive pulmonary disease, unspecified: Secondary | ICD-10-CM | POA: Diagnosis not present

## 2015-11-18 DIAGNOSIS — J42 Unspecified chronic bronchitis: Secondary | ICD-10-CM | POA: Diagnosis not present

## 2015-11-18 DIAGNOSIS — K219 Gastro-esophageal reflux disease without esophagitis: Secondary | ICD-10-CM | POA: Diagnosis not present

## 2015-11-18 DIAGNOSIS — Z79899 Other long term (current) drug therapy: Secondary | ICD-10-CM | POA: Insufficient documentation

## 2015-11-18 MED ORDER — RANITIDINE HCL 300 MG PO TABS: 300.0000 mg | ORAL_TABLET | Freq: Every day | ORAL | Status: DC

## 2015-11-18 MED ORDER — GI COCKTAIL ~~LOC~~
30.0000 mL | Freq: Once | ORAL | Status: AC
  Administered 2015-11-18: 30 mL via ORAL

## 2015-11-18 MED FILL — raNITIdine HCL 150 MG TABS: 150 | 30 days supply | Qty: 60 | Fill #0

## 2015-11-18 NOTE — Assessment & Plan Note (Signed)
Zantac ordered.

## 2015-11-18 NOTE — Assessment & Plan Note (Signed)
Exacerbation has resolved Patient has quit smoking No need for additional steroids, Abx or inhalers at this time

## 2015-11-18 NOTE — Progress Notes (Signed)
Patient ID: [name], female   DOB: October 10, 1971, 45 y.o.   MRN: [id number]   Subjective:  Patient ID: [name], female    DOB: January 13, 1971  Age: 45 y.o. MRN: [id number]  CC: Bronchitis   HPI [name] presents for   F/u of bronchitis: she is a former smoker. She was hospitalized from 12/23-12.25/16 for COPD exacerbation. She was seen 2 days after discharge by my colleague. At the time she had not started her outpatient Levaquin and prednisone and was feeling poorly. Since then she has improved SOB. She still has some cough. She has quit smoking and feels positive about it.  GERD: tried protonix without relief. Has pain in upper esophagus. Pain throughout the day. No nausea or emesis.   Social History  Substance Use Topics  . Smoking status: Former Smoker -- 0.00 packs/day for 26 years    Types: Cigarettes    Quit date: 10/09/2015  . Smokeless tobacco: Never Used  . Alcohol Use: No    Outpatient Prescriptions Prior to Visit  Medication Sig Dispense Refill  . albuterol (PROVENTIL HFA;VENTOLIN HFA) 108 (90 BASE) MCG/ACT inhaler Inhale 2 puffs into the lungs every 6 (six) hours as needed for wheezing or shortness of breath. 1 Inhaler 1  . atorvastatin (LIPITOR) 40 MG tablet Take 1 tablet (40 mg total) by mouth daily. 90 tablet 3  . benzonatate (TESSALON) 100 MG capsule Take 1 capsule (100 mg total) by mouth 3 (three) times daily as needed for cough. 20 capsule 0  . clopidogrel (PLAVIX) 75 MG tablet Take 1 tablet (75 mg total) by mouth daily. 90 tablet 3  . ferrous sulfate 325 (65 FE) MG tablet Take 1 tablet (325 mg total) by mouth 3 (three) times daily with meals. 90 tablet 5  . fluticasone (FLONASE) 50 MCG/ACT nasal spray Place 2 sprays into both nostrils daily. 16 g 2  . gabapentin (NEURONTIN) 100 MG capsule Take 1 capsule (100 mg total) by mouth 3 (three) times daily. 90 capsule 3  . hydrOXYzine (VISTARIL) 25 MG capsule Take 1 capsule (25 mg total) by mouth 3 (three) times  daily as needed for itching. 30 capsule 2  . Multiple Vitamin (MULTIVITAMIN WITH MINERALS) TABS tablet Take 1 tablet by mouth daily. Reported on 11/04/2015    . guaiFENesin (MUCINEX) 600 MG 12 hr tablet Take 1 tablet (600 mg total) by mouth 2 (two) times daily. (Patient not taking: Reported on 11/18/2015) 20 tablet 0  . levofloxacin (LEVAQUIN) 750 MG tablet Take 1 tablet (750 mg total) by mouth daily. (Patient not taking: Reported on 11/18/2015) 3 tablet 0  . pantoprazole (PROTONIX) 40 MG tablet Take 1 tablet (40 mg total) by mouth daily. Take 30-60 min before first meal of the day (Patient not taking: Reported on 11/04/2015) 30 tablet 2  . predniSONE (DELTASONE) 10 MG tablet Take 40mg  (4 tabs) x 3 days, then taper to 30mg  (3 tabs) x 3 days, then 20mg  (2 tabs) x 3days, then 10mg  (1 tab) x 3days, then Stop (Patient not taking: Reported on 11/04/2015) 30 tablet 0  . ranitidine (ZANTAC) 150 MG tablet Take 150 mg by mouth daily. Reported on 11/18/2015     No facility-administered medications prior to visit.    ROS Review of Systems  Constitutional: Negative for fever and chills.  Eyes: Negative for visual disturbance.  Respiratory: Negative for shortness of breath.   Cardiovascular: Negative for chest pain.  Gastrointestinal: Negative for abdominal pain and blood in stool.  Musculoskeletal: Negative for back pain and arthralgias.  Skin: Negative for rash.  Allergic/Immunologic: Negative for immunocompromised state.  Hematological: Negative for adenopathy. Does not bruise/bleed easily.  Psychiatric/Behavioral: Negative for suicidal ideas and dysphoric mood.    Objective:  BP 105/72 mmHg  Pulse 87  Temp(Src) 98.4 F (36.9 C) (Oral)  Resp 16  Ht 5' 3.5" (1.613 m)  Wt 196 lb (88.905 kg)  BMI 34.17 kg/m2  SpO2 98%  LMP 10/25/2015  BP/Weight 11/18/2015 11/04/2015 [id number]  Systolic BP 123456 Q000111Q 0000000  Diastolic BP 72 93 67  Wt. (Lbs) 196 197 193.56  BMI 34.17 35.44 34.3    Physical Exam    Constitutional: She is oriented to person, place, and time. She appears well-developed and well-nourished. No distress.  HENT:  Head: Normocephalic and atraumatic.  Cardiovascular: Normal rate, regular rhythm, normal heart sounds and intact distal pulses.   Pulmonary/Chest: Effort normal and breath sounds normal.  Abdominal: Soft. Bowel sounds are normal. She exhibits no distension and no mass. There is no tenderness. There is no rebound and no guarding.  Musculoskeletal: She exhibits no edema.  Neurological: She is alert and oriented to person, place, and time.  Skin: Skin is warm and dry. No rash noted.  Psychiatric: She has a normal mood and affect.   GI cocktail x one given  Depression screen [facility] 2/9 11/18/2015 11/04/2015 09/04/2015 04/04/2015 02/21/2015  Decreased Interest 1 1 0 1 0  Down, Depressed, Hopeless 1 1 0 0 0  PHQ - 2 Score 2 2 0 1 0  Altered sleeping 2 0 - 1 -  Tired, decreased energy 3 0 - 1 -  Change in appetite 0 1 - 0 -  Feeling bad or failure about yourself  0 0 - 0 -  Trouble concentrating 0 0 - 0 -  Moving slowly or fidgety/restless 0 0 - 1 -  Suicidal thoughts 0 0 - 0 -  PHQ-9 Score 7 3 - 4 -      Assessment & Plan:   Problem List Items Addressed This Visit    COPD (chronic obstructive pulmonary disease) (HCC) - Primary   GERD (gastroesophageal reflux disease) (Chronic)   Relevant Medications   ranitidine (ZANTAC) 300 MG tablet   gi cocktail (Maalox,Lidocaine,Donnatal) (Completed)      No orders of the defined types were placed in this encounter.    Follow-up: No Follow-up on file.   Boykin Nearing MD

## 2015-11-18 NOTE — Patient Instructions (Signed)
Cori was seen today for bronchitis.  Diagnoses and all orders for this visit:  Chronic bronchitis, unspecified chronic bronchitis type ([place])  Gastroesophageal reflux disease, esophagitis presence not specified -     ranitidine (ZANTAC) 300 MG tablet; Take 1 tablet (300 mg total) by mouth at bedtime. Reported on 11/18/2015 -     gi cocktail (Maalox,Lidocaine,Donnatal); Take 30 mLs by mouth once.   prilosec interferes with plavix  Instead take zantac at bedtime Avoid overeating and common food triggers, see below  Call GI to schedule colonoscopy for anemia and screening    F.u in 4-6 weeks for pap smear  Dr. Adrian Blackwater   Food Choices for Gastroesophageal Reflux Disease, Adult When you have gastroesophageal reflux disease (GERD), the foods you eat and your eating habits are very important. Choosing the right foods can help ease the discomfort of GERD. WHAT GENERAL GUIDELINES DO I NEED TO FOLLOW?  Choose fruits, vegetables, whole grains, low-fat dairy products, and low-fat meat, fish, and poultry.  Limit fats such as oils, salad dressings, butter, nuts, and avocado.  Keep a food diary to identify foods that cause symptoms.  Avoid foods that cause reflux. These may be different for different people.  Eat frequent small meals instead of three large meals each day.  Eat your meals slowly, in a relaxed setting.  Limit fried foods.  Cook foods using methods other than frying.  Avoid drinking alcohol.  Avoid drinking large amounts of liquids with your meals.  Avoid bending over or lying down until 2-3 hours after eating. WHAT FOODS ARE NOT RECOMMENDED? The following are some foods and drinks that may worsen your symptoms: Vegetables Tomatoes. Tomato juice. Tomato and spaghetti sauce. Chili peppers. Onion and garlic. Horseradish. Fruits Oranges, grapefruit, and lemon (fruit and juice). Meats High-fat meats, fish, and poultry. This includes hot dogs, ribs, ham, sausage, salami,  and bacon. Dairy Whole milk and chocolate milk. Sour cream. Cream. Butter. Ice cream. Cream cheese.  Beverages Coffee and tea, with or without caffeine. Carbonated beverages or energy drinks. Condiments Hot sauce. Barbecue sauce.  Sweets/Desserts Chocolate and cocoa. Donuts. Peppermint and spearmint. Fats and Oils High-fat foods, including [place] fries and potato chips. Other Vinegar. Strong spices, such as black pepper, white pepper, red pepper, cayenne, curry powder, cloves, ginger, and chili powder. The items listed above may not be a complete list of foods and beverages to avoid. Contact your dietitian for more information.   This information is not intended to replace advice given to you by your health care provider. Make sure you discuss any questions you have with your health care provider.   Document Released: 10/25/2005 Document Revised: 11/15/2014 Document Reviewed: 08/29/2013 Elsevier Interactive Patient Education [employer].

## 2015-11-18 NOTE — Progress Notes (Signed)
F/U pneumonia, still with dry cough  C/C heartburn  No pain today  No tobacco x 2 weeks  No suicidal thought in the past two weeks

## 2015-12-11 ENCOUNTER — Ambulatory Visit: Payer: Self-pay | Admitting: Internal Medicine

## 2015-12-30 ENCOUNTER — Other Ambulatory Visit (HOSPITAL_COMMUNITY)
Admission: RE | Admit: 2015-12-30 | Discharge: 2015-12-30 | Disposition: A | Discharge status: Home / Self Care | Payer: Managed Care, Other (non HMO) | Source: Ambulatory Visit | Attending: Family Medicine | Admitting: Family Medicine

## 2015-12-30 ENCOUNTER — Ambulatory Visit: Payer: Managed Care, Other (non HMO) | Attending: Family Medicine | Admitting: Family Medicine

## 2015-12-30 ENCOUNTER — Encounter: Payer: Self-pay | Admitting: Family Medicine

## 2015-12-30 VITALS — BP 119/85 | HR 79 | Temp 98.5°F | Resp 16 | Ht 63.5 in | Wt 201.0 lb

## 2015-12-30 DIAGNOSIS — Z79899 Other long term (current) drug therapy: Secondary | ICD-10-CM | POA: Insufficient documentation

## 2015-12-30 DIAGNOSIS — N920 Excessive and frequent menstruation with regular cycle: Secondary | ICD-10-CM

## 2015-12-30 DIAGNOSIS — N760 Acute vaginitis: Secondary | ICD-10-CM | POA: Diagnosis not present

## 2015-12-30 DIAGNOSIS — R87610 Atypical squamous cells of undetermined significance on cytologic smear of cervix (ASC-US): Secondary | ICD-10-CM | POA: Diagnosis not present

## 2015-12-30 DIAGNOSIS — Z1272 Encounter for screening for malignant neoplasm of vagina: Secondary | ICD-10-CM | POA: Insufficient documentation

## 2015-12-30 DIAGNOSIS — Z01411 Encounter for gynecological examination (general) (routine) with abnormal findings: Secondary | ICD-10-CM | POA: Diagnosis present

## 2015-12-30 DIAGNOSIS — B9689 Other specified bacterial agents as the cause of diseases classified elsewhere: Secondary | ICD-10-CM

## 2015-12-30 DIAGNOSIS — A499 Bacterial infection, unspecified: Secondary | ICD-10-CM

## 2015-12-30 DIAGNOSIS — Z124 Encounter for screening for malignant neoplasm of cervix: Secondary | ICD-10-CM | POA: Diagnosis not present

## 2015-12-30 DIAGNOSIS — Z87891 Personal history of nicotine dependence: Secondary | ICD-10-CM | POA: Insufficient documentation

## 2015-12-30 DIAGNOSIS — Z113 Encounter for screening for infections with a predominantly sexual mode of transmission: Secondary | ICD-10-CM | POA: Insufficient documentation

## 2015-12-30 DIAGNOSIS — K219 Gastro-esophageal reflux disease without esophagitis: Secondary | ICD-10-CM

## 2015-12-30 DIAGNOSIS — Z1151 Encounter for screening for human papillomavirus (HPV): Secondary | ICD-10-CM | POA: Diagnosis not present

## 2015-12-30 NOTE — Patient Instructions (Addendum)
[name] was seen today for gynecologic exam.  Diagnoses and all orders for this visit:  Pap smear for cervical cancer screening -     Cytology - PAP [place]  Menorrhagia with regular cycle -     Cancel: US Pelvis Complete; Future -     US Transvaginal Non-OB; Future   Use ibuprofen or aleve starting the day before and the first two days of your period to lighten flow and reduce cramping.  You will be called with pap results You will be called with ultrasound results and follow up plan  F/u in 3 months for heavy menses, anemia and COPD  Dr. Adrian Blackwater   Menorrhagia Menorrhagia is the medical term for when your menstrual periods are heavy or last longer than usual. With menorrhagia, every period you have may cause enough blood loss and cramping that you are unable to maintain your usual activities. CAUSES  In some cases, the cause of heavy periods is unknown, but a number of conditions may cause menorrhagia. Common causes include:  A problem with the hormone-producing thyroid gland (hypothyroid).  Noncancerous growths in the uterus (polyps or fibroids).  An imbalance of the estrogen and progesterone hormones.  One of your ovaries not releasing an egg during one or more months.  Side effects of having an intrauterine device (IUD).  Side effects of some medicines, such as anti-inflammatory medicines or blood thinners.  A bleeding disorder that stops your blood from clotting normally. SIGNS AND SYMPTOMS  During a normal period, bleeding lasts between 4 and 8 days. Signs that your periods are too heavy include:  You routinely have to change your pad or tampon every 1 or 2 hours because it is completely soaked.  You pass blood clots larger than 1 inch (2.5 cm) in size.  You have bleeding for more than 7 days.  You need to use pads and tampons at the same time because of heavy bleeding.  You need to wake up to change your pads or tampons during the night.  You have symptoms  of anemia, such as tiredness, fatigue, or shortness of breath. DIAGNOSIS  Your health care provider will perform a physical exam and ask you questions about your symptoms and menstrual history. Other tests may be ordered based on what the health care provider finds during the exam. These tests can include:  Blood tests. Blood tests are used to check if you are pregnant or have hormonal changes, a bleeding or thyroid disorder, low iron levels (anemia), or other problems.  Endometrial biopsy. Your health care provider takes a sample of tissue from the inside of your uterus to be examined under a microscope.  Pelvic ultrasound. This test uses sound waves to make a picture of your uterus, ovaries, and vagina. The pictures can show if you have fibroids or other growths.  Hysteroscopy. For this test, your health care provider will use a small telescope to look inside your uterus. Based on the results of your initial tests, your health care provider may recommend further testing. TREATMENT  Treatment may not be needed. If it is needed, your health care provider may recommend treatment with one or more medicines first. If these do not reduce bleeding enough, a surgical treatment might be an option. The best treatment for you will depend on:   Whether you need to prevent pregnancy.  Your desire to have children in the future.  The cause and severity of your bleeding.  Your opinion and personal preference.  Medicines  for menorrhagia may include:  Birth control methods that use hormones. These include the pill, skin patch, vaginal ring, shots that you get every 3 months, hormonal IUD, and implant. These treatments reduce bleeding during your menstrual period.  Medicines that thicken blood and slow bleeding.  Medicines that reduce swelling, such as ibuprofen.  Medicines that contain a synthetic hormone called progestin.   Medicines that make the ovaries stop working for a short time.  You  may need surgical treatment for menorrhagia if the medicines are unsuccessful. Treatment options include:  Dilation and curettage (D&C). In this procedure, your health care provider opens (dilates) your cervix and then scrapes or suctions tissue from the lining of your uterus to reduce menstrual bleeding.  Operative hysteroscopy. This procedure uses a tiny tube with a light (hysteroscope) to view your uterine cavity and can help in the surgical removal of a polyp that may be causing heavy periods.  Endometrial ablation. Through various techniques, your health care provider permanently destroys the entire lining of your uterus (endometrium). After endometrial ablation, most women have little or no menstrual flow. Endometrial ablation reduces your ability to become pregnant.  Endometrial resection. This surgical procedure uses an electrosurgical wire loop to remove the lining of the uterus. This procedure also reduces your ability to become pregnant.  Hysterectomy. Surgical removal of the uterus and cervix is a permanent procedure that stops menstrual periods. Pregnancy is not possible after a hysterectomy. This procedure requires anesthesia and hospitalization. HOME CARE INSTRUCTIONS   Only take over-the-counter or prescription medicines as directed by your health care provider. Take prescribed medicines exactly as directed. Do not change or switch medicines without consulting your health care provider.  Take any prescribed iron pills exactly as directed by your health care provider. Long-term heavy bleeding may result in low iron levels. Iron pills help replace the iron your body lost from heavy bleeding. Iron may cause constipation. If this becomes a problem, increase the bran, fruits, and roughage in your diet.  Do not take aspirin or medicines that contain aspirin 1 week before or during your menstrual period. Aspirin may make the bleeding worse.  If you need to change your sanitary pad or  tampon more than once every 2 hours, stay in bed and rest as much as possible until the bleeding stops.  Eat well-balanced meals. Eat foods high in iron. Examples are leafy green vegetables, meat, liver, eggs, and whole grain breads and cereals. Do not try to lose weight until the abnormal bleeding has stopped and your blood iron level is back to normal. SEEK MEDICAL CARE IF:   You soak through a pad or tampon every 1 or 2 hours, and this happens every time you have a period.  You need to use pads and tampons at the same time because you are bleeding so much.  You need to change your pad or tampon during the night.  You have a period that lasts for more than 8 days.  You pass clots bigger than 1 inch wide.  You have irregular periods that happen more or less often than once a month.  You feel dizzy or faint.  You feel very weak or tired.  You feel short of breath or feel your heart is beating too fast when you exercise.  You have nausea and vomiting or diarrhea while you are taking your medicine.  You have any problems that may be related to the medicine you are taking. SEEK IMMEDIATE MEDICAL CARE IF:  You soak through 4 or more pads or tampons in 2 hours.  You have any bleeding while you are pregnant. MAKE SURE YOU:   Understand these instructions.  Will watch your condition.  Will get help right away if you are not doing well or get worse.   This information is not intended to replace advice given to you by your health care provider. Make sure you discuss any questions you have with your health care provider.   Document Released: 10/25/2005 Document Revised: 10/30/2013 Document Reviewed: 04/15/2013 Elsevier Interactive Patient Education [employer].

## 2015-12-30 NOTE — Progress Notes (Signed)
SUBJECTIVE:  45 y.o. female for pap.   Social History  Substance Use Topics  . Smoking status: Former Smoker -- 0.00 packs/day for 26 years    Types: Cigarettes    Quit date: 10/09/2015  . Smokeless tobacco: Never Used  . Alcohol Use: No   Current Outpatient Prescriptions  Medication Sig Dispense Refill  . albuterol (PROVENTIL HFA;VENTOLIN HFA) 108 (90 BASE) MCG/ACT inhaler Inhale 2 puffs into the lungs every 6 (six) hours as needed for wheezing or shortness of breath. 1 Inhaler 1  . atorvastatin (LIPITOR) 40 MG tablet Take 1 tablet (40 mg total) by mouth daily. 90 tablet 3  . benzonatate (TESSALON) 100 MG capsule Take 1 capsule (100 mg total) by mouth 3 (three) times daily as needed for cough. 20 capsule 0  . clopidogrel (PLAVIX) 75 MG tablet Take 1 tablet (75 mg total) by mouth daily. 90 tablet 3  . ferrous sulfate 325 (65 FE) MG tablet Take 1 tablet (325 mg total) by mouth 3 (three) times daily with meals. 90 tablet 5  . fluticasone (FLONASE) 50 MCG/ACT nasal spray Place 2 sprays into both nostrils daily. 16 g 2  . gabapentin (NEURONTIN) 100 MG capsule Take 1 capsule (100 mg total) by mouth 3 (three) times daily. 90 capsule 3  . hydrOXYzine (VISTARIL) 25 MG capsule Take 1 capsule (25 mg total) by mouth 3 (three) times daily as needed for itching. 30 capsule 2  . Multiple Vitamin (MULTIVITAMIN WITH MINERALS) TABS tablet Take 1 tablet by mouth daily. Reported on 11/04/2015    . ranitidine (ZANTAC) 300 MG tablet Take 1 tablet (300 mg total) by mouth at bedtime. Reported on 11/18/2015 30 tablet 3   No current facility-administered medications for this visit.   Allergies: Shellfish allergy and Latex  No LMP recorded.  ROS:  Feeling well. No dyspnea or chest pain on exertion.  No abdominal pain, change in bowel habits, black or bloody stools.  No urinary tract symptoms. GYN ROS: she complains of heavy menses for the past 6 years. Heavy for first 3 days of period. No neurological  complaints.  OBJECTIVE:  The patient appears well, alert, oriented x 3, in no distress. BP 119/85 mmHg  Pulse 79  Temp(Src) 98.5 F (36.9 C) (Oral)  Resp 16  Ht 5' 3.5" (1.613 m)  Wt 201 lb (91.173 kg)  BMI 35.04 kg/m2  SpO2 100%  LMP 12/24/2015 ENT normal.  Neck supple. No adenopathy or thyromegaly. PERLA. Lungs are clear, good air entry, no wheezes, rhonchi or rales. S1 and S2 normal, no murmurs, regular rate and rhythm. Abdomen soft without tenderness, guarding, mass or organomegaly. Extremities show no edema, normal peripheral pulses. Neurological is normal, no focal findings.  BREAST EXAM: breasts appear normal, no suspicious masses, no skin or nipple changes or axillary nodes, not examined  PELVIC EXAM: normal external genitalia, vulva, vagina, cervix. Fullness without tenderness in R adnexa.   ASSESSMENT:  well woman  PLAN:  pap smear Pelvic US for menorrhagia

## 2015-12-30 NOTE — Progress Notes (Signed)
Annual gyn testing  No vaginal discharge, no odor  Pain scale # 8. Tooth ache  Requesting dental referral  Former smoker  No suicidal thoughts in the past two weeks

## 2015-12-31 ENCOUNTER — Encounter: Payer: Self-pay | Admitting: Clinical

## 2015-12-31 LAB — CERVICOVAGINAL ANCILLARY ONLY
CHLAMYDIA, DNA PROBE: NEGATIVE
NEISSERIA GONORRHEA: NEGATIVE
Wet Prep (BD Affirm): POSITIVE — AB

## 2015-12-31 LAB — CYTOLOGY - PAP

## 2015-12-31 NOTE — Progress Notes (Signed)
Depression screen [facility] 2/9 12/30/2015 11/18/2015 11/04/2015 09/04/2015 04/04/2015  Decreased Interest 1 1 1  0 1  Down, Depressed, Hopeless 0 1 1 0 0  PHQ - 2 Score 1 2 2  0 1  Altered sleeping 1 2 0 - 1  Tired, decreased energy 1 3 0 - 1  Change in appetite 0 0 1 - 0  Feeling bad or failure about yourself  0 0 0 - 0  Trouble concentrating 0 0 0 - 0  Moving slowly or fidgety/restless 0 0 0 - 1  Suicidal thoughts 0 0 0 - 0  PHQ-9 Score 3 7 3  - 4    GAD 7 : Generalized Anxiety Score 12/30/2015 11/18/2015  Nervous, Anxious, on Edge 0 0  Control/stop worrying 0 1  Worry too much - different things 1 1  Trouble relaxing 0 0  Restless 0 0  Easily annoyed or irritable 1 0  Afraid - awful might happen 0 0  Total GAD 7 Score 2 2

## 2016-01-01 DIAGNOSIS — R87610 Atypical squamous cells of undetermined significance on cytologic smear of cervix (ASC-US): Secondary | ICD-10-CM | POA: Insufficient documentation

## 2016-01-01 MED ORDER — FLUCONAZOLE 150 MG PO TABS: 150.0000 mg | ORAL_TABLET | Freq: Once | ORAL | Status: DC

## 2016-01-01 MED ORDER — METRONIDAZOLE 500 MG PO TABS
500.0000 mg | ORAL_TABLET | Freq: Two times a day (BID) | ORAL | Status: DC
Start: 2016-01-01 — End: 2016-07-16

## 2016-01-01 NOTE — Addendum Note (Signed)
Addended by: Boykin Nearing on: 01/01/2016 10:38 PM   Modules accepted: Orders, SmartSet

## 2016-01-02 MED FILL — FLUCONAZOLE 150 MG TABLET: 150 | 1 days supply | Qty: 1 | Fill #0

## 2016-01-02 MED FILL — metroNIDAZOLE 500 MG TABS: 500 | 7 days supply | Qty: 14 | Fill #0

## 2016-01-03 ENCOUNTER — Other Ambulatory Visit: Payer: Self-pay | Admitting: Family Medicine

## 2016-01-03 DIAGNOSIS — N920 Excessive and frequent menstruation with regular cycle: Secondary | ICD-10-CM

## 2016-01-05 ENCOUNTER — Ambulatory Visit (HOSPITAL_COMMUNITY): Admission: RE | Admit: 2016-01-05 | Payer: Managed Care, Other (non HMO) | Source: Ambulatory Visit

## 2016-07-16 ENCOUNTER — Encounter: Payer: Self-pay | Admitting: Family Medicine

## 2016-07-16 ENCOUNTER — Ambulatory Visit: Payer: Managed Care, Other (non HMO) | Attending: Family Medicine | Admitting: Family Medicine

## 2016-07-16 VITALS — BP 117/77 | HR 75 | Temp 98.0°F | Resp 20 | Ht 63.5 in | Wt 209.0 lb

## 2016-07-16 DIAGNOSIS — Z8673 Personal history of transient ischemic attack (TIA), and cerebral infarction without residual deficits: Secondary | ICD-10-CM | POA: Diagnosis not present

## 2016-07-16 DIAGNOSIS — R0981 Nasal congestion: Secondary | ICD-10-CM | POA: Diagnosis not present

## 2016-07-16 DIAGNOSIS — Z79899 Other long term (current) drug therapy: Secondary | ICD-10-CM | POA: Diagnosis not present

## 2016-07-16 DIAGNOSIS — R05 Cough: Secondary | ICD-10-CM | POA: Diagnosis present

## 2016-07-16 DIAGNOSIS — Z87891 Personal history of nicotine dependence: Secondary | ICD-10-CM | POA: Insufficient documentation

## 2016-07-16 DIAGNOSIS — Z981 Arthrodesis status: Secondary | ICD-10-CM | POA: Insufficient documentation

## 2016-07-16 DIAGNOSIS — J42 Unspecified chronic bronchitis: Secondary | ICD-10-CM

## 2016-07-16 DIAGNOSIS — K219 Gastro-esophageal reflux disease without esophagitis: Secondary | ICD-10-CM

## 2016-07-16 DIAGNOSIS — Z7902 Long term (current) use of antithrombotics/antiplatelets: Secondary | ICD-10-CM | POA: Diagnosis not present

## 2016-07-16 DIAGNOSIS — I6330 Cerebral infarction due to thrombosis of unspecified cerebral artery: Secondary | ICD-10-CM

## 2016-07-16 DIAGNOSIS — R059 Cough, unspecified: Secondary | ICD-10-CM

## 2016-07-16 DIAGNOSIS — Z7951 Long term (current) use of inhaled steroids: Secondary | ICD-10-CM | POA: Diagnosis not present

## 2016-07-16 DIAGNOSIS — R252 Cramp and spasm: Secondary | ICD-10-CM

## 2016-07-16 MED ORDER — ATORVASTATIN CALCIUM 40 MG PO TABS: 40.0000 mg | ORAL_TABLET | Freq: Every day | ORAL | 3 refills | Status: DC

## 2016-07-16 MED ORDER — ALBUTEROL SULFATE HFA 108 (90 BASE) MCG/ACT IN AERS: 2.0000 | INHALATION_SPRAY | Freq: Four times a day (QID) | RESPIRATORY_TRACT | 1 refills | Status: DC | PRN

## 2016-07-16 MED ORDER — GABAPENTIN 100 MG PO CAPS: 100.0000 mg | ORAL_CAPSULE | Freq: Three times a day (TID) | ORAL | 3 refills | Status: DC

## 2016-07-16 MED ORDER — RANITIDINE HCL 300 MG PO TABS: 300.0000 mg | ORAL_TABLET | Freq: Every day | ORAL | 5 refills | Status: DC

## 2016-07-16 MED ORDER — CLOPIDOGREL BISULFATE 75 MG PO TABS: 75.0000 mg | ORAL_TABLET | Freq: Every day | ORAL | 3 refills | Status: DC

## 2016-07-16 MED ORDER — AZITHROMYCIN 250 MG PO TABS: ORAL_TABLET | ORAL | 0 refills | Status: DC

## 2016-07-16 MED ORDER — FLUTICASONE PROPIONATE 50 MCG/ACT NA SUSP: 2.0000 | Freq: Every day | NASAL | 2 refills | Status: DC

## 2016-07-16 NOTE — Patient Instructions (Addendum)
Vitoria was seen today for cough.  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 -     Discontinue: clopidogrel (PLAVIX) 75 MG tablet; Take 1 tablet (75 mg total) by mouth daily. -     atorvastatin (LIPITOR) 40 MG tablet; Take 1 tablet (40 mg total) by mouth daily. -     clopidogrel (PLAVIX) 75 MG tablet; Take 1 tablet (75 mg total) by mouth daily.  S/P cervical spinal fusion -     Discontinue: gabapentin (NEURONTIN) 100 MG capsule; Take 1 capsule (100 mg total) by mouth 3 (three) times daily. -     gabapentin (NEURONTIN) 100 MG capsule; Take 1 capsule (100 mg total) by mouth 3 (three) times daily.  Gastroesophageal reflux disease, esophagitis presence not specified -     Discontinue: ranitidine (ZANTAC) 300 MG tablet; Take 1 tablet (300 mg total) by mouth at bedtime. -     ranitidine (ZANTAC) 300 MG tablet; Take 1 tablet (300 mg total) by mouth at bedtime.  Cough -     Discontinue: albuterol (PROVENTIL HFA;VENTOLIN HFA) 108 (90 Base) MCG/ACT inhaler; Inhale 2 puffs into the lungs every 6 (six) hours as needed for wheezing or shortness of breath. -     albuterol (PROVENTIL HFA;VENTOLIN HFA) 108 (90 Base) MCG/ACT inhaler; Inhale 2 puffs into the lungs every 6 (six) hours as needed for wheezing or shortness of breath. -     azithromycin (ZITHROMAX) 250 MG tablet; 500 mg by mouth today, then 250 mg daily for next 4 days  Nasal congestion -     fluticasone (FLONASE) 50 MCG/ACT nasal spray; Place 2 sprays into both nostrils daily.  Drink tonic water for muscle cramps   F/u for flu shot at earliest convenience  Fu with me in 6 weeks for reflux   Dr. Adrian Blackwater

## 2016-07-16 NOTE — Progress Notes (Signed)
Subjective:  Patient ID: [name], female    DOB: 1971/07/17  Age: 45 y.o. MRN: [id number]  CC: Cough   HPI ROZALEE KELLAR  Has hx of COPD, CVA, smoker she presents for   1. Cough: x 2 weeks. Productive of clear sputum. No CP or SOB. She recently started smoking again.    Social History  Substance Use Topics  . Smoking status: Former Smoker    Packs/day: 0.00    Years: 26.00    Types: Cigarettes    Quit date: 10/09/2015  . Smokeless tobacco: Former Systems developer  . Alcohol use No   Outpatient Medications Prior to Visit  Medication Sig Dispense Refill  . albuterol (PROVENTIL HFA;VENTOLIN HFA) 108 (90 BASE) MCG/ACT inhaler Inhale 2 puffs into the lungs every 6 (six) hours as needed for wheezing or shortness of breath. 1 Inhaler 1  . atorvastatin (LIPITOR) 40 MG tablet Take 1 tablet (40 mg total) by mouth daily. 90 tablet 3  . fluticasone (FLONASE) 50 MCG/ACT nasal spray Place 2 sprays into both nostrils daily. 16 g 2  . gabapentin (NEURONTIN) 100 MG capsule Take 1 capsule (100 mg total) by mouth 3 (three) times daily. 90 capsule 3  . omeprazole (PRILOSEC) 20 MG capsule Take 20 mg by mouth daily.    . benzonatate (TESSALON) 100 MG capsule Take 1 capsule (100 mg total) by mouth 3 (three) times daily as needed for cough. (Patient not taking: Reported on 07/16/2016) 20 capsule 0  . clopidogrel (PLAVIX) 75 MG tablet Take 1 tablet (75 mg total) by mouth daily. (Patient not taking: Reported on 07/16/2016) 90 tablet 3  . ferrous sulfate 325 (65 FE) MG tablet Take 1 tablet (325 mg total) by mouth 3 (three) times daily with meals. 90 tablet 5  . fluconazole (DIFLUCAN) 150 MG tablet Take 1 tablet (150 mg total) by mouth once. (Patient not taking: Reported on 07/16/2016) 1 tablet 0  . hydrOXYzine (VISTARIL) 25 MG capsule Take 1 capsule (25 mg total) by mouth 3 (three) times daily as needed for itching. (Patient not taking: Reported on 07/16/2016) 30 capsule 2  . metroNIDAZOLE (FLAGYL) 500 MG tablet Take 1  tablet (500 mg total) by mouth 2 (two) times daily. (Patient not taking: Reported on 07/16/2016) 14 tablet 0  . Multiple Vitamin (MULTIVITAMIN WITH MINERALS) TABS tablet Take 1 tablet by mouth daily. Reported on 11/04/2015     No facility-administered medications prior to visit.     ROS Review of Systems  Constitutional: Negative for chills and fever.  Eyes: Negative for visual disturbance.  Respiratory: Positive for cough. Negative for shortness of breath.   Cardiovascular: Negative for chest pain.  Gastrointestinal: Negative for abdominal pain and blood in stool.  Musculoskeletal: Positive for myalgias. Negative for arthralgias and back pain.  Skin: Negative for rash.  Allergic/Immunologic: Negative for immunocompromised state.  Hematological: Negative for adenopathy. Does not bruise/bleed easily.  Psychiatric/Behavioral: Negative for dysphoric mood and suicidal ideas.    Objective:  BP 117/77 (BP Location: Left Arm, Patient Position: Sitting, Cuff Size: Large)   Pulse 75   Temp 98 F (36.7 C) (Oral)   Resp 20   Ht 5' 3.5" (1.613 m)   Wt 209 lb (94.8 kg)   LMP 06/25/2016 (Exact Date)   SpO2 98%   BMI 36.44 kg/m   BP/Weight 07/16/2016 12/30/2015 [id number]  Systolic BP 123XX123 [id number]  Diastolic BP 77 85 72  Wt. (Lbs) 209 201 196  BMI 36.44 35.04 34.17  Physical Exam  Constitutional: She is oriented to person, place, and time. She appears well-developed and well-nourished. No distress.  Obese   HENT:  Head: Normocephalic and atraumatic.  Cardiovascular: Normal rate, regular rhythm, normal heart sounds and intact distal pulses.   Pulmonary/Chest: Effort normal and breath sounds normal.  Musculoskeletal: She exhibits no edema.  Neurological: She is alert and oriented to person, place, and time.  Skin: Skin is warm and dry. No rash noted.  Psychiatric: She has a normal mood and affect.     Assessment & Plan:  Mirjana was seen today for cough.  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 -     Discontinue: clopidogrel (PLAVIX) 75 MG tablet; Take 1 tablet (75 mg total) by mouth daily. -     atorvastatin (LIPITOR) 40 MG tablet; Take 1 tablet (40 mg total) by mouth daily. -     clopidogrel (PLAVIX) 75 MG tablet; Take 1 tablet (75 mg total) by mouth daily.  S/P cervical spinal fusion -     Discontinue: gabapentin (NEURONTIN) 100 MG capsule; Take 1 capsule (100 mg total) by mouth 3 (three) times daily. -     gabapentin (NEURONTIN) 100 MG capsule; Take 1 capsule (100 mg total) by mouth 3 (three) times daily.  Gastroesophageal reflux disease, esophagitis presence not specified -     Discontinue: ranitidine (ZANTAC) 300 MG tablet; Take 1 tablet (300 mg total) by mouth at bedtime. -     ranitidine (ZANTAC) 300 MG tablet; Take 1 tablet (300 mg total) by mouth at bedtime.  Cough -     Discontinue: albuterol (PROVENTIL HFA;VENTOLIN HFA) 108 (90 Base) MCG/ACT inhaler; Inhale 2 puffs into the lungs every 6 (six) hours as needed for wheezing or shortness of breath. -     albuterol (PROVENTIL HFA;VENTOLIN HFA) 108 (90 Base) MCG/ACT inhaler; Inhale 2 puffs into the lungs every 6 (six) hours as needed for wheezing or shortness of breath. -     azithromycin (ZITHROMAX) 250 MG tablet; 500 mg by mouth today, then 250 mg daily for next 4 days  Nasal congestion -     fluticasone (FLONASE) 50 MCG/ACT nasal spray; Place 2 sprays into both nostrils daily.   There are no diagnoses linked to this encounter.  No orders of the defined types were placed in this encounter.   Follow-up: No Follow-up on file.   Boykin Nearing MD

## 2016-07-16 NOTE — Progress Notes (Signed)
History of bronchitis.  Cough x 1/2 weeks, productive white phlegm. Muscle cramps x 1 month.  Tried tylenol.  Patient states suppose to be taking Plavix; also needs refill for: gabapentin, albuterol, Prilosec

## 2016-07-17 LAB — BASIC METABOLIC PANEL WITH GFR
BUN: 13 mg/dL (ref 7–25)
CALCIUM: 9.1 mg/dL (ref 8.6–10.2)
CO2: 23 mmol/L (ref 20–31)
Chloride: 104 mmol/L (ref 98–110)
Creat: 0.83 mg/dL (ref 0.50–1.10)
GFR, EST NON AFRICAN AMERICAN: 84 mL/min (ref >=60)
GLUCOSE: 88 mg/dL (ref 65–99)
POTASSIUM: 4.3 mmol/L (ref 3.5–5.3)
Sodium: 137 mmol/L (ref 135–146)

## 2016-07-18 NOTE — Assessment & Plan Note (Signed)
COPD with worsening cough. Plan: z-pak  Albuterol prn

## 2016-09-16 ENCOUNTER — Ambulatory Visit: Payer: Managed Care, Other (non HMO) | Attending: Family Medicine | Admitting: Physician Assistant

## 2016-09-16 VITALS — BP 120/86 | HR 93 | Temp 98.5°F | Resp 16 | Wt 213.8 lb

## 2016-09-16 DIAGNOSIS — Z888 Allergy status to other drugs, medicaments and biological substances status: Secondary | ICD-10-CM | POA: Insufficient documentation

## 2016-09-16 DIAGNOSIS — D508 Other iron deficiency anemias: Secondary | ICD-10-CM | POA: Diagnosis not present

## 2016-09-16 DIAGNOSIS — Z8673 Personal history of transient ischemic attack (TIA), and cerebral infarction without residual deficits: Secondary | ICD-10-CM | POA: Insufficient documentation

## 2016-09-16 DIAGNOSIS — R05 Cough: Secondary | ICD-10-CM | POA: Diagnosis not present

## 2016-09-16 DIAGNOSIS — R059 Cough, unspecified: Secondary | ICD-10-CM

## 2016-09-16 DIAGNOSIS — Z91013 Allergy to seafood: Secondary | ICD-10-CM | POA: Insufficient documentation

## 2016-09-16 DIAGNOSIS — J209 Acute bronchitis, unspecified: Secondary | ICD-10-CM

## 2016-09-16 DIAGNOSIS — R739 Hyperglycemia, unspecified: Secondary | ICD-10-CM

## 2016-09-16 DIAGNOSIS — K219 Gastro-esophageal reflux disease without esophagitis: Secondary | ICD-10-CM | POA: Diagnosis not present

## 2016-09-16 DIAGNOSIS — Z79899 Other long term (current) drug therapy: Secondary | ICD-10-CM | POA: Insufficient documentation

## 2016-09-16 LAB — CBC WITH DIFFERENTIAL/PLATELET
Basophils Absolute: 0 cells/uL (ref 0–200)
Basophils Relative: 0 %
EOS PCT: 2 %
Eosinophils Absolute: 128 cells/uL (ref 15–500)
HCT: 30 % — ABNORMAL LOW (ref 35.0–45.0)
HEMOGLOBIN: 8.6 g/dL — AB (ref 11.7–15.5)
LYMPHS ABS: 2880 {cells}/uL (ref 850–3900)
Lymphocytes Relative: 45 %
MCH: 18.3 pg — ABNORMAL LOW (ref 27.0–33.0)
MCHC: 29 g/dL — ABNORMAL LOW (ref 32.0–36.0)
MCV: 63.6 fL — ABNORMAL LOW (ref 80.0–100.0)
MONOS PCT: 8 %
Monocytes Absolute: 512 cells/uL (ref 200–950)
NEUTROS ABS: 2880 {cells}/uL (ref 1500–7800)
Neutrophils Relative %: 45 %
PLATELETS: 289 10*3/uL (ref 140–400)
RBC: 4.69 MIL/uL (ref 3.80–5.10)
RDW: 20.1 % — ABNORMAL HIGH (ref 11.0–15.0)
WBC: 6.4 10*3/uL (ref 3.8–10.8)

## 2016-09-16 LAB — GLUCOSE, POCT (MANUAL RESULT ENTRY): POC Glucose: 146 mg/dl — AB (ref 70–99)

## 2016-09-16 LAB — POCT GLYCOSYLATED HEMOGLOBIN (HGB A1C): Hemoglobin A1C: 5.9

## 2016-09-16 MED ORDER — AZITHROMYCIN 250 MG PO TABS: ORAL_TABLET | ORAL | 0 refills | Status: DC

## 2016-09-16 MED ORDER — RANITIDINE HCL 300 MG PO TABS: 300.0000 mg | ORAL_TABLET | Freq: Every day | ORAL | 5 refills | Status: DC

## 2016-09-16 MED FILL — ?AZITHROMYCIN 250 MG TABLET: 250 | 5 days supply | Qty: 6 | Fill #0

## 2016-09-16 NOTE — Patient Instructions (Signed)

## 2016-09-16 NOTE — Progress Notes (Signed)
Pt is in the office today for possible bronchitis Pt states she is not in any pain Pt states she is taking medication without difficulty

## 2016-09-16 NOTE — Progress Notes (Signed)
[name], is a 45 y.o. female  [id number]  [id number]  DOB - Oct 30, 1971  Subjective:  Chief Complaint and HPI: [name] is a 45 y.o. female here today for 7-10 day history of cough that is productive of whitish-yellow mucus.  She denies fever/chills.  Some nasal congestion.  Symptoms getting progressively worse.  She denies s/sx of hyperglycemia but upon reviewin her labs, I noticed she had high blood sugar about 10 months ago.  She is anemic and is supposed to be taking iron but isn't due to constipation.  She does complain of fatigue     ROS:   Constitutional:  No f/c, No night sweats, No unexplained weight loss.  +fatigue EENT:  No vision changes, No blurry vision, No hearing changes. No mouth, throat, or ear problems.  Respiratory: + cough, No SOB.  Minimal wheezing Cardiac: No CP, no palpitations GI:  No abd pain, No N/V/D. GU: No Urinary s/sx Musculoskeletal: No joint pain Neuro: No headache, no dizziness, no motor weakness.  Skin: No rash Endocrine:  No polydipsia. No polyuria.  Psych: Denies SI/HI  No problems updated.  ALLERGIES: Allergies  Allergen Reactions  . Shellfish Allergy Itching and Swelling  . Latex Itching and Rash    PAST MEDICAL HISTORY: Past Medical History:  Diagnosis Date  . Anemia   . GERD (gastroesophageal reflux disease)   . Headache(784.0)   . History of blood transfusion    Hx; of in 1991 after delivery  . History of TIAs   . Numbness    Right hand  . Pneumonia   . Stroke [facility]) 05/2013    MEDICATIONS AT HOME: Prior to Admission medications   Medication Sig Start Date End Date Taking? Authorizing Provider  albuterol (PROVENTIL HFA;VENTOLIN HFA) 108 (90 Base) MCG/ACT inhaler Inhale 2 puffs into the lungs every 6 (six) hours as needed for wheezing or shortness of breath. 07/16/16  Yes Josalyn Funches, MD  atorvastatin (LIPITOR) 40 MG tablet Take 1 tablet (40 mg total) by mouth daily. 07/16/16  Yes Josalyn Funches, MD    clopidogrel (PLAVIX) 75 MG tablet Take 1 tablet (75 mg total) by mouth daily. 07/16/16  Yes Josalyn Funches, MD  fluticasone (FLONASE) 50 MCG/ACT nasal spray Place 2 sprays into both nostrils daily. 07/16/16  Yes Josalyn Funches, MD  ranitidine (ZANTAC) 300 MG tablet Take 1 tablet (300 mg total) by mouth at bedtime. 09/16/16  Yes Argentina Donovan, PA-C  azithromycin (ZITHROMAX) 250 MG tablet 500 mg by mouth today, then 250 mg daily for next 4 days 09/16/16   Argentina Donovan, PA-C  ferrous sulfate 325 (65 FE) MG tablet Take 1 tablet (325 mg total) by mouth 3 (three) times daily with meals. Patient not taking: Reported on 09/16/2016 09/10/15   Boykin Nearing, MD  gabapentin (NEURONTIN) 100 MG capsule Take 1 capsule (100 mg total) by mouth 3 (three) times daily. Patient not taking: Reported on 09/16/2016 07/16/16   Boykin Nearing, MD  Multiple Vitamin (MULTIVITAMIN WITH MINERALS) TABS tablet Take 1 tablet by mouth daily. Reported on 11/04/2015    Historical Provider, MD     Objective:  EXAM:   Vitals:   09/16/16 1449  BP: 120/86  Pulse: 93  Resp: 16  Temp: 98.5 F (36.9 C)  TempSrc: Oral  SpO2: 98%  Weight: 213 lb 12.8 oz (97 kg)    General appearance : A&OX3. NAD. Non-toxic-appearing HEENT: Atraumatic and Normocephalic.  PERRLA. EOM intact.  TM clear B. Mouth-MMM, post pharynx WNL  w/o erythema, No PND. Neck: supple, no JVD. No cervical lymphadenopathy. No thyromegaly Chest/Lungs:  Breathing-non-labored, Good air entry bilaterally, breath sounds normal without rales. There is mild/minimal rhonchi and wheezing  CVS: S1 S2 regular, no murmurs, gallops, rubs  Extremities: Bilateral Lower Ext shows no edema, both legs are warm to touch with = pulse throughout Neurology:  CN II-XII grossly intact, Non focal.   Psych:  TP linear. J/I WNL. Normal speech. Appropriate eye contact and affect.  Skin:  No Rash  Data Review Lab Results  Component Value Date   HGBA1C 5.50 09/04/2015   HGBA1C 5.6  01/07/2014   HGBA1C 6.1 (H) 08/24/2013     Assessment & Plan   1. Gastroesophageal reflux disease, esophagitis presence not specified - ranitidine (ZANTAC) 300 MG tablet; Take 1 tablet (300 mg total) by mouth at bedtime.  Dispense: 30 tablet; Refill: 5  2. Other iron deficiency anemia She will likely need to resume Iron as she is supposed to be taking it and isn't. - CBC with Differential/Platelet  3. Hyperglycemia I have had a lengthy discussion and provided education about insulin resistance and the intake of too much sugar/refined carbohydrates.  I have advised the patient to work at a goal of eliminating sugary drinks, candy, desserts, sweets, refined sugars, processed foods, and white carbohydrates.  The patient expresses understanding.  - POCT glucose (manual entry) - POCT glycosylated hemoglobin (Hb A1C)=5.9  4. Acute bronchitis, unspecified organism Cover for atypicals/pertussis - azithromycin (ZITHROMAX) 250 MG tablet; 500 mg by mouth today, then 250 mg daily for next 4 days  Dispense: 6 tablet; Refill: 0   Patient have been counseled extensively about nutrition and exercise  Return in about 8 weeks (around 11/11/2016) for f/up Dr Adrian Blackwater anemia; hyperglycemia.  The patient was given clear instructions to go to ER or return to medical center if symptoms don't improve, worsen or new problems develop. The patient verbalized understanding. The patient was told to call to get lab results if they haven't heard anything in the next week.     Freeman Caldron, PA-C [facility] and [facility] [place], [place]   09/16/2016, 3:42 PMPatient ID: [name], female   DOB: 1971-07-04, 45 y.o.   MRN: [id number]

## 2016-09-17 ENCOUNTER — Telehealth: Payer: Self-pay

## 2016-09-17 NOTE — Telephone Encounter (Signed)
Contacted pt to go over lab results pt is aware and doesn't have any questions or concerns 

## 2016-11-11 ENCOUNTER — Encounter: Payer: Self-pay | Admitting: Family Medicine

## 2016-11-11 ENCOUNTER — Ambulatory Visit: Payer: Self-pay | Attending: Family Medicine | Admitting: Family Medicine

## 2016-11-11 VITALS — BP 120/80 | HR 84 | Temp 98.1°F | Ht 63.5 in | Wt 216.6 lb

## 2016-11-11 DIAGNOSIS — Z981 Arthrodesis status: Secondary | ICD-10-CM

## 2016-11-11 DIAGNOSIS — Z9889 Other specified postprocedural states: Secondary | ICD-10-CM | POA: Insufficient documentation

## 2016-11-11 DIAGNOSIS — J209 Acute bronchitis, unspecified: Secondary | ICD-10-CM | POA: Insufficient documentation

## 2016-11-11 DIAGNOSIS — R7303 Prediabetes: Secondary | ICD-10-CM | POA: Insufficient documentation

## 2016-11-11 DIAGNOSIS — R0789 Other chest pain: Secondary | ICD-10-CM | POA: Insufficient documentation

## 2016-11-11 DIAGNOSIS — J440 Chronic obstructive pulmonary disease with acute lower respiratory infection: Secondary | ICD-10-CM | POA: Insufficient documentation

## 2016-11-11 DIAGNOSIS — K5900 Constipation, unspecified: Secondary | ICD-10-CM | POA: Insufficient documentation

## 2016-11-11 DIAGNOSIS — D509 Iron deficiency anemia, unspecified: Secondary | ICD-10-CM | POA: Insufficient documentation

## 2016-11-11 DIAGNOSIS — Z79899 Other long term (current) drug therapy: Secondary | ICD-10-CM | POA: Insufficient documentation

## 2016-11-11 DIAGNOSIS — K219 Gastro-esophageal reflux disease without esophagitis: Secondary | ICD-10-CM | POA: Insufficient documentation

## 2016-11-11 DIAGNOSIS — Z8673 Personal history of transient ischemic attack (TIA), and cerebral infarction without residual deficits: Secondary | ICD-10-CM | POA: Insufficient documentation

## 2016-11-11 DIAGNOSIS — Z7902 Long term (current) use of antithrombotics/antiplatelets: Secondary | ICD-10-CM | POA: Insufficient documentation

## 2016-11-11 DIAGNOSIS — Z23 Encounter for immunization: Secondary | ICD-10-CM | POA: Insufficient documentation

## 2016-11-11 DIAGNOSIS — Z87891 Personal history of nicotine dependence: Secondary | ICD-10-CM | POA: Insufficient documentation

## 2016-11-11 LAB — CBC
HEMATOCRIT: 30.3 % — AB (ref 35.0–45.0)
Hemoglobin: 8.9 g/dL — ABNORMAL LOW (ref 11.7–15.5)
MCH: 18.5 pg — ABNORMAL LOW (ref 27.0–33.0)
MCHC: 29.4 g/dL — ABNORMAL LOW (ref 32.0–36.0)
MCV: 63 fL — AB (ref 80.0–100.0)
MPV: 8.7 fL (ref 7.5–12.5)
Platelets: 382 10*3/uL (ref 140–400)
RBC: 4.81 MIL/uL (ref 3.80–5.10)
RDW: 20.3 % — ABNORMAL HIGH (ref 11.0–15.0)
WBC: 7.4 10*3/uL (ref 3.8–10.8)

## 2016-11-11 LAB — IRON AND TIBC
%SAT: 1 % — AB (ref 11–50)
Iron: <10 ug/dL — ABNORMAL LOW (ref 40–190)
TIBC: 439 ug/dL (ref 250–450)
UIBC: 434 ug/dL — ABNORMAL HIGH (ref 125–400)

## 2016-11-11 LAB — FERRITIN: FERRITIN: 2 ng/mL — AB (ref 10–232)

## 2016-11-11 LAB — POCT GLYCOSYLATED HEMOGLOBIN (HGB A1C): HEMOGLOBIN A1C: 5.7

## 2016-11-11 MED ORDER — GABAPENTIN 100 MG PO CAPS: 100.0000 mg | ORAL_CAPSULE | Freq: Three times a day (TID) | ORAL | 5 refills | Status: DC

## 2016-11-11 MED ORDER — ALBUTEROL SULFATE HFA 108 (90 BASE) MCG/ACT IN AERS: 2.0000 | INHALATION_SPRAY | Freq: Four times a day (QID) | RESPIRATORY_TRACT | 5 refills | Status: DC | PRN

## 2016-11-11 MED ORDER — ATORVASTATIN CALCIUM 40 MG PO TABS: 40.0000 mg | ORAL_TABLET | Freq: Every day | ORAL | 11 refills | Status: DC

## 2016-11-11 MED ORDER — RANITIDINE HCL 300 MG PO TABS: 300.0000 mg | ORAL_TABLET | Freq: Every day | ORAL | 5 refills | Status: DC

## 2016-11-11 MED ORDER — ALBUTEROL SULFATE (2.5 MG/3ML) 0.083% IN NEBU
5.0000 mg | INHALATION_SOLUTION | Freq: Once | RESPIRATORY_TRACT | Status: AC
  Administered 2016-11-11: 5 mg via RESPIRATORY_TRACT

## 2016-11-11 MED ORDER — CLOPIDOGREL BISULFATE 75 MG PO TABS: 75.0000 mg | ORAL_TABLET | Freq: Every day | ORAL | 11 refills | Status: DC

## 2016-11-11 MED ORDER — AZITHROMYCIN 250 MG PO TABS: ORAL_TABLET | ORAL | 0 refills | Status: DC

## 2016-11-11 MED FILL — CLOPIDOGREL 75 MG TABLET: 75 | 30 days supply | Qty: 30 | Fill #0

## 2016-11-11 MED FILL — VENTOLIN HFA 90 MCG INHALER: 108 (90 BAS | 25 days supply | Qty: 18 | Fill #0

## 2016-11-11 MED FILL — GABAPENTIN 100 MG CAPSULE: 100 | 30 days supply | Qty: 90 | Fill #0

## 2016-11-11 MED FILL — raNITIdine HCL 300 MG TABS: 300 | 30 days supply | Qty: 30 | Fill #0

## 2016-11-11 MED FILL — ?ATORVASTATIN 40MG TABLET: 40 | 30 days supply | Qty: 30 | Fill #0

## 2016-11-11 MED FILL — ?AZITHROMYCIN 250 MG TABLET: 250 | 5 days supply | Qty: 6 | Fill #0

## 2016-11-11 NOTE — Patient Instructions (Addendum)
[name] was seen today for anemia and cough.  Diagnoses and all orders for this visit:  Iron deficiency anemia, unspecified iron deficiency anemia type -     CBC -     Iron and TIBC -     Ferritin  Gastroesophageal reflux disease, esophagitis presence not specified -     Discontinue: ranitidine (ZANTAC) 300 MG tablet; Take 1 tablet (300 mg total) by mouth at bedtime. -     Discontinue: ranitidine (ZANTAC) 300 MG tablet; Take 1 tablet (300 mg total) by mouth at bedtime. -     ranitidine (ZANTAC) 300 MG tablet; Take 1 tablet (300 mg total) by mouth at bedtime.  S/P cervical spinal fusion -     Discontinue: gabapentin (NEURONTIN) 100 MG capsule; Take 1 capsule (100 mg total) by mouth 3 (three) times daily. -     Discontinue: gabapentin (NEURONTIN) 100 MG capsule; Take 1 capsule (100 mg total) by mouth 3 (three) times daily. -     gabapentin (NEURONTIN) 100 MG capsule; Take 1 capsule (100 mg total) by mouth 3 (three) times daily.  Prediabetes -     HgB A1c  Acute bronchitis, unspecified organism -     albuterol (PROVENTIL HFA;VENTOLIN HFA) 108 (90 Base) MCG/ACT inhaler; Inhale 2 puffs into the lungs every 6 (six) hours as needed for wheezing or shortness of breath. -     albuterol (PROVENTIL) (2.5 MG/3ML) 0.083% nebulizer solution 5 mg; Take 6 mLs (5 mg total) by nebulization once. -     azithromycin (ZITHROMAX) 250 MG tablet; 500 mg by mouth today, then 250 mg daily for next 4 days  Other orders -     Discontinue: albuterol (PROVENTIL HFA;VENTOLIN HFA) 108 (90 Base) MCG/ACT inhaler; Inhale 2 puffs into the lungs every 6 (six) hours as needed for wheezing or shortness of breath. -     Flu Vaccine QUAD 36+ mos IM -     clopidogrel (PLAVIX) 75 MG tablet; Take 1 tablet (75 mg total) by mouth daily. -     atorvastatin (LIPITOR) 40 MG tablet; Take 1 tablet (40 mg total) by mouth daily.  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    You had two episodes of stroke in 2014, July and November. Be sure to take your plavix and lipitor daily to prevent another stroke.   F/u in 3 months for weight check and chronic bronchitis  Dr. Adrian Blackwater

## 2016-11-11 NOTE — Assessment & Plan Note (Signed)
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

## 2016-11-11 NOTE — Assessment & Plan Note (Addendum)
Non compliant with daily iron supplement Checking iron levels and CBC today Advised iron with vit C or small amount of orange juice   Very low iron levels with anemia Fortunately anemia has improved Take oral iron 3 times daily with vit C tablet or small amount of low sugar orange juice for next 8 weeks Will recheck, if still low, you will be scheduled for IV iron infusion

## 2016-11-11 NOTE — Assessment & Plan Note (Addendum)
COPD with mild flare  Plan: z-pac Albuterol 5 mg neb treatment x 1 Then prn   Flu shot

## 2016-11-11 NOTE — Progress Notes (Signed)
Subjective:  Patient ID: [name], female    DOB: 1971-02-12  Age: 46 y.o. MRN: [id number]  CC: Anemia and Cough   HPI [name]  Has hx of COPD, CVA (05/2013 and 08/2013), former smoker  smoker she presents for   1. COPD exacerbation: cough x 4 days . Productive of clear sputum. Chest tightness. No SOB. No smoking. She has not had albuterol to take at home.   2. Iron deficiency anemia: taking oral iron sometimes. Taking Women's multiivitamin daily. Oral iron constipates. She admits to constipation when she takes iron   3. Obesity: she quit smoking and gained weight. She has high appetite. She was diagnosed with pre-diabetes at her last visit. She does not exercise. She overeats.    Social History  Substance Use Topics  . Smoking status: Former Smoker    Packs/day: 0.00    Years: 26.00    Types: Cigarettes    Quit date: 10/09/2015  . Smokeless tobacco: Former Systems developer  . Alcohol use No   Outpatient Medications Prior to Visit  Medication Sig Dispense Refill  . albuterol (PROVENTIL HFA;VENTOLIN HFA) 108 (90 Base) MCG/ACT inhaler Inhale 2 puffs into the lungs every 6 (six) hours as needed for wheezing or shortness of breath. 1 Inhaler 1  . clopidogrel (PLAVIX) 75 MG tablet Take 1 tablet (75 mg total) by mouth daily. 90 tablet 3  . ferrous sulfate 325 (65 FE) MG tablet Take 1 tablet (325 mg total) by mouth 3 (three) times daily with meals. 90 tablet 5  . fluticasone (FLONASE) 50 MCG/ACT nasal spray Place 2 sprays into both nostrils daily. 16 g 2  . gabapentin (NEURONTIN) 100 MG capsule Take 1 capsule (100 mg total) by mouth 3 (three) times daily. 90 capsule 3  . ranitidine (ZANTAC) 300 MG tablet Take 1 tablet (300 mg total) by mouth at bedtime. 30 tablet 5  . atorvastatin (LIPITOR) 40 MG tablet Take 1 tablet (40 mg total) by mouth daily. (Patient not taking: Reported on 11/11/2016) 90 tablet 3  . azithromycin (ZITHROMAX) 250 MG tablet 500 mg by mouth today, then 250 mg daily for  next 4 days (Patient not taking: Reported on 11/11/2016) 6 tablet 0  . Multiple Vitamin (MULTIVITAMIN WITH MINERALS) TABS tablet Take 1 tablet by mouth daily. Reported on 11/04/2015     No facility-administered medications prior to visit.     ROS Review of Systems  Constitutional: Negative for chills and fever.  Eyes: Negative for visual disturbance.  Respiratory: Positive for cough. Negative for shortness of breath.   Cardiovascular: Negative for chest pain.  Gastrointestinal: Negative for abdominal pain and blood in stool.  Musculoskeletal: Positive for myalgias. Negative for arthralgias and back pain.  Skin: Negative for rash.  Allergic/Immunologic: Negative for immunocompromised state.  Hematological: Negative for adenopathy. Does not bruise/bleed easily.  Psychiatric/Behavioral: Negative for dysphoric mood and suicidal ideas.    Objective:  BP 120/80 (BP Location: Left Arm, Patient Position: Sitting, Cuff Size: Small)   Pulse 84   Temp 98.1 F (36.7 C) (Oral)   Ht 5' 3.5" (1.613 m)   Wt 216 lb 9.6 oz (98.2 kg)   LMP 10/24/2016   SpO2 99%   BMI 37.77 kg/m   BP/Weight 11/11/2016 [id number]  Systolic BP 123456 123456 [id number]  Diastolic BP 80 86 77  Wt. (Lbs) 216.6 213.8 209  BMI 37.77 37.28 36.44   Physical Exam  Constitutional: She is oriented to person, place, and time. She appears  well-developed and well-nourished. No distress.  Obese   HENT:  Head: Normocephalic and atraumatic.  Right Ear: Tympanic membrane, external ear and ear canal normal.  Left Ear: Tympanic membrane, external ear and ear canal normal.  Nose: Mucosal edema (on R ) present.  Mouth/Throat: Oropharynx is clear and moist and mucous membranes are normal.  Cardiovascular: Normal rate, regular rhythm, normal heart sounds and intact distal pulses.   Pulmonary/Chest: Effort normal and breath sounds normal. No respiratory distress. She has no wheezes.  Coarse breath sounds bilaterally   Musculoskeletal:  She exhibits no edema.  Neurological: She is alert and oriented to person, place, and time.  Skin: Skin is warm and dry. No rash noted.  Psychiatric: She has a normal mood and affect.   Lab Results  Component Value Date   HGBA1C 5.9 09/16/2016    Lab Results  Component Value Date   HGBA1C 5.7 11/11/2016    Depression screen [facility] 2/9 11/11/2016 09/16/2016 07/16/2016  Decreased Interest 1 0 0  Down, Depressed, Hopeless 0 0 0  PHQ - 2 Score 1 0 0  Altered sleeping 1 - -  Tired, decreased energy 2 - -  Change in appetite 0 - -  Feeling bad or failure about yourself  0 - -  Trouble concentrating 0 - -  Moving slowly or fidgety/restless 0 - -  Suicidal thoughts 0 - -  PHQ-9 Score 4 - -   GAD 7 : Generalized Anxiety Score 11/11/2016 09/16/2016 07/16/2016 12/30/2015  Nervous, Anxious, on Edge 0 0 0 0  Control/stop worrying 0 0 0 0  Worry too much - different things 0 0 0 1  Trouble relaxing 0 0 0 0  Restless 0 0 0 0  Easily annoyed or irritable 0 0 0 1  Afraid - awful might happen 0 0 0 0  Total GAD 7 Score 0 0 0 2    Assessment & Plan:  [name] was seen today for anemia and cough.  Diagnoses and all orders for this visit:  Iron deficiency anemia, unspecified iron deficiency anemia type -     CBC -     Iron and TIBC -     Ferritin  Gastroesophageal reflux disease, esophagitis presence not specified -     Discontinue: ranitidine (ZANTAC) 300 MG tablet; Take 1 tablet (300 mg total) by mouth at bedtime. -     Discontinue: ranitidine (ZANTAC) 300 MG tablet; Take 1 tablet (300 mg total) by mouth at bedtime. -     ranitidine (ZANTAC) 300 MG tablet; Take 1 tablet (300 mg total) by mouth at bedtime.  S/P cervical spinal fusion -     Discontinue: gabapentin (NEURONTIN) 100 MG capsule; Take 1 capsule (100 mg total) by mouth 3 (three) times daily. -     Discontinue: gabapentin (NEURONTIN) 100 MG capsule; Take 1 capsule (100 mg total) by mouth 3 (three) times daily. -     gabapentin (NEURONTIN)  100 MG capsule; Take 1 capsule (100 mg total) by mouth 3 (three) times daily.  Prediabetes -     HgB A1c  Acute bronchitis, unspecified organism -     albuterol (PROVENTIL HFA;VENTOLIN HFA) 108 (90 Base) MCG/ACT inhaler; Inhale 2 puffs into the lungs every 6 (six) hours as needed for wheezing or shortness of breath. -     albuterol (PROVENTIL) (2.5 MG/3ML) 0.083% nebulizer solution 5 mg; Take 6 mLs (5 mg total) by nebulization once. -     azithromycin (ZITHROMAX) 250 MG tablet;  500 mg by mouth today, then 250 mg daily for next 4 days  Other orders -     Discontinue: albuterol (PROVENTIL HFA;VENTOLIN HFA) 108 (90 Base) MCG/ACT inhaler; Inhale 2 puffs into the lungs every 6 (six) hours as needed for wheezing or shortness of breath. -     Flu Vaccine QUAD 36+ mos IM -     clopidogrel (PLAVIX) 75 MG tablet; Take 1 tablet (75 mg total) by mouth daily. -     atorvastatin (LIPITOR) 40 MG tablet; Take 1 tablet (40 mg total) by mouth daily.   There are no diagnoses linked to this encounter.  No orders of the defined types were placed in this encounter.   Follow-up: Return in about 3 months (around 02/09/2017) for weight check and chronic bronchitis .   Boykin Nearing MD

## 2016-11-11 NOTE — Assessment & Plan Note (Signed)
Denies smoking

## 2016-11-11 NOTE — Progress Notes (Signed)
Pt is here to follow up on anemia. Pt has cough that she has had for 4 days.  Pt needs refill on Zantac, albuterol, gabapentin.

## 2016-12-15 ENCOUNTER — Encounter: Payer: Self-pay | Admitting: Physician Assistant

## 2016-12-15 ENCOUNTER — Ambulatory Visit: Payer: Self-pay | Attending: Physician Assistant | Admitting: Physician Assistant

## 2016-12-15 VITALS — BP 116/80 | HR 96 | Temp 99.0°F | Wt 210.0 lb

## 2016-12-15 DIAGNOSIS — M545 Low back pain, unspecified: Secondary | ICD-10-CM

## 2016-12-15 DIAGNOSIS — K219 Gastro-esophageal reflux disease without esophagitis: Secondary | ICD-10-CM | POA: Insufficient documentation

## 2016-12-15 DIAGNOSIS — R350 Frequency of micturition: Secondary | ICD-10-CM | POA: Insufficient documentation

## 2016-12-15 DIAGNOSIS — R3129 Other microscopic hematuria: Secondary | ICD-10-CM | POA: Insufficient documentation

## 2016-12-15 DIAGNOSIS — Z8673 Personal history of transient ischemic attack (TIA), and cerebral infarction without residual deficits: Secondary | ICD-10-CM | POA: Insufficient documentation

## 2016-12-15 LAB — POCT URINALYSIS DIPSTICK
Bilirubin, UA: NEGATIVE
Glucose, UA: NEGATIVE
Ketones, UA: NEGATIVE
Leukocytes, UA: NEGATIVE
NITRITE UA: NEGATIVE
PH UA: 5.5
Protein, UA: NEGATIVE
SPEC GRAV UA: 1.02
UROBILINOGEN UA: 0.2

## 2016-12-15 MED ORDER — TRAMADOL HCL 50 MG PO TABS: 50.0000 mg | ORAL_TABLET | Freq: Three times a day (TID) | ORAL | 0 refills | Status: AC | PRN

## 2016-12-15 MED ORDER — TAMSULOSIN HCL 0.4 MG PO CAPS: 0.4000 mg | ORAL_CAPSULE | Freq: Every day | ORAL | 0 refills | Status: AC

## 2016-12-15 MED FILL — TAMSULOSIN HCL 0.4 MG CAP: 0.4 | 7 days supply | Qty: 7 | Fill #0

## 2016-12-15 NOTE — Patient Instructions (Signed)
I suspect your back pain may be attributed to renal stones. I will need for you to get a CT scan done in order to confirm. In the meantime, please monitor your symptoms and go to the emergency department should you develop fever, chills, nausea, vomiting, increased back pain, inability to urinate, confusion, or any other worrisome symptom.  Renal Colic Renal colic is pain that is caused by passing a kidney stone. The pain can be sharp and severe. It may be felt in the back, abdomen, side (flank), or groin. It can cause nausea. Renal colic can come and go. Follow these instructions at home: Watch your condition for any changes. The following actions may help to lessen any discomfort that you are feeling:  Take medicines only as directed by your health care provider.  Ask your health care provider if it is okay to take over-the-counter pain medicine.  Drink enough fluid to keep your urine clear or pale yellow. Drink 6-8 glasses of water each day.  Limit the amount of salt that you eat to less than 2 grams per day.  Reduce the amount of protein in your diet. Eat less meat, fish, nuts, and dairy.  Avoid foods such as spinach, rhubarb, nuts, or bran. These may make kidney stones more likely to form. Contact a health care provider if:  You have a fever or chills.  Your urine smells bad or looks cloudy.  You have pain or burning when you pass urine. Get help right away if:  Your flank pain or groin pain suddenly worsens.  You become confused or disoriented or you lose consciousness. This information is not intended to replace advice given to you by your health care provider. Make sure you discuss any questions you have with your health care provider. Document Released: 08/04/2005 Document Revised: 03/30/2016 Document Reviewed: 09/04/2014 Elsevier Interactive Patient Education  2017 [employer].

## 2016-12-15 NOTE — Progress Notes (Signed)
Subjective:  Patient ID: [name], female    DOB: 04/03/1971  Age: 46 y.o. MRN: [id number]  CC: left sided back pain  HPI [name] is a 46 y.o. female with a PMH of   Past Medical History:  Diagnosis Date  . GERD (gastroesophageal reflux disease)   . Headache(784.0)   . History of blood transfusion    Hx; of in 1991 after delivery  . History of TIAs   . Numbness    Right hand  . Pneumonia   . Stroke ([place]) 05/2013    that presents with a 1 day history of sudden left sided back pain which seems to be progressing to the left flank. Pain is constant throughout the day. No positional relief. Has noted urinary urgency, urinary frequency, but no dysuria or hematuria. Has not taken anything for relief. Denies f/c/n/v, chest pain, SOB, abdominal pain, rash, or GI symptoms.     Outpatient Medications Prior to Visit  Medication Sig Dispense Refill  . albuterol (PROVENTIL HFA;VENTOLIN HFA) 108 (90 Base) MCG/ACT inhaler Inhale 2 puffs into the lungs every 6 (six) hours as needed for wheezing or shortness of breath. 1 Inhaler 5  . atorvastatin (LIPITOR) 40 MG tablet Take 1 tablet (40 mg total) by mouth daily. 30 tablet 11  . azithromycin (ZITHROMAX) 250 MG tablet 500 mg by mouth today, then 250 mg daily for next 4 days 6 tablet 0  . clopidogrel (PLAVIX) 75 MG tablet Take 1 tablet (75 mg total) by mouth daily. 30 tablet 11  . ferrous sulfate 325 (65 FE) MG tablet Take 1 tablet (325 mg total) by mouth 3 (three) times daily with meals. 90 tablet 5  . fluticasone (FLONASE) 50 MCG/ACT nasal spray Place 2 sprays into both nostrils daily. 16 g 2  . gabapentin (NEURONTIN) 100 MG capsule Take 1 capsule (100 mg total) by mouth 3 (three) times daily. 90 capsule 5  . Multiple Vitamin (MULTIVITAMIN WITH MINERALS) TABS tablet Take 1 tablet by mouth daily. Reported on 11/04/2015    . ranitidine (ZANTAC) 300 MG tablet Take 1 tablet (300 mg total) by mouth at bedtime. 30 tablet 5   No  facility-administered medications prior to visit.      ROS Review of Systems  Constitutional: Negative for chills, fever and malaise/fatigue.  Respiratory: Negative for cough and shortness of breath.   Cardiovascular: Negative for chest pain and leg swelling.  Gastrointestinal: Negative for abdominal pain, nausea and vomiting.  Genitourinary: Positive for flank pain, frequency and urgency. Negative for dysuria and hematuria.  Musculoskeletal: Positive for back pain. Negative for falls.  Skin: Negative for itching and rash.  Neurological: Negative for dizziness and headaches.    Objective:  BP 116/80 (BP Location: Left Arm, Patient Position: Sitting, Cuff Size: Large)   Pulse 96   Temp 99 F (37.2 C) (Oral)   Wt 210 lb (95.3 kg)   SpO2 96%   BMI 36.62 kg/m   BP/Weight 12/15/2016 11/11/2016 [id number]  Systolic BP 99991111 123456 123456  Diastolic BP 80 80 86  Wt. (Lbs) 210 216.6 213.8  BMI 36.62 37.77 37.28      Physical Exam  Constitutional: She is oriented to person, place, and time.  NAD, obese, polite  HENT:  Head: Normocephalic and atraumatic.  Eyes: No scleral icterus.  Cardiovascular: Normal rate, regular rhythm and normal heart sounds.   Pulmonary/Chest: Effort normal and breath sounds normal.  Abdominal: Soft. Bowel sounds are normal.  Musculoskeletal: She exhibits  no edema.  Neurological: She is alert and oriented to person, place, and time.  Skin: Skin is warm and dry. No rash noted. No erythema.  Psychiatric: She has a normal mood and affect. Her behavior is normal. Thought content normal.     Assessment & Plan:   1. Urinary frequency  - Urinalysis Dipstick - tamsulosin (FLOMAX) 0.4 MG CAPS capsule; Take 1 capsule (0.4 mg total) by mouth daily.  Dispense: 7 capsule; Refill: 0 - traMADol (ULTRAM) 50 MG tablet; Take 1 tablet (50 mg total) by mouth every 8 (eight) hours as needed.  Dispense: 9 tablet; Refill: 0  2. Acute left-sided low back pain without  sciatica  - Urinalysis Dipstick - CT RENAL STONE STUDY; Future - tamsulosin (FLOMAX) 0.4 MG CAPS capsule; Take 1 capsule (0.4 mg total) by mouth daily.  Dispense: 7 capsule; Refill: 0 - traMADol (ULTRAM) 50 MG tablet; Take 1 tablet (50 mg total) by mouth every 8 (eight) hours as needed.  Dispense: 9 tablet; Refill: 0  3. Other microscopic hematuria  - CT RENAL STONE STUDY; Future   Meds ordered this encounter  Medications  . tamsulosin (FLOMAX) 0.4 MG CAPS capsule    Sig: Take 1 capsule (0.4 mg total) by mouth daily.    Dispense:  7 capsule    Refill:  0    Order Specific Question:   Supervising Provider    Answer:   Tresa Garter [id number]  . traMADol (ULTRAM) 50 MG tablet    Sig: Take 1 tablet (50 mg total) by mouth every 8 (eight) hours as needed.    Dispense:  9 tablet    Refill:  0    Order Specific Question:   Supervising Provider    Answer:   Tresa Garter [id number]    Follow-up: Return in about 2 days (around 12/17/2016) for follow up back pain.Clent Demark PA

## 2016-12-17 ENCOUNTER — Encounter: Payer: Self-pay | Admitting: Physician Assistant

## 2016-12-17 ENCOUNTER — Ambulatory Visit: Payer: Self-pay | Attending: Physician Assistant | Admitting: Physician Assistant

## 2016-12-17 VITALS — BP 118/81 | HR 93 | Temp 98.5°F | Resp 16 | Wt 213.2 lb

## 2016-12-17 DIAGNOSIS — R8281 Pyuria: Secondary | ICD-10-CM

## 2016-12-17 DIAGNOSIS — Z8673 Personal history of transient ischemic attack (TIA), and cerebral infarction without residual deficits: Secondary | ICD-10-CM | POA: Insufficient documentation

## 2016-12-17 DIAGNOSIS — Z79899 Other long term (current) drug therapy: Secondary | ICD-10-CM | POA: Insufficient documentation

## 2016-12-17 DIAGNOSIS — N390 Urinary tract infection, site not specified: Secondary | ICD-10-CM

## 2016-12-17 DIAGNOSIS — M545 Low back pain, unspecified: Secondary | ICD-10-CM

## 2016-12-17 DIAGNOSIS — R319 Hematuria, unspecified: Secondary | ICD-10-CM | POA: Insufficient documentation

## 2016-12-17 DIAGNOSIS — K219 Gastro-esophageal reflux disease without esophagitis: Secondary | ICD-10-CM | POA: Insufficient documentation

## 2016-12-17 LAB — POCT URINALYSIS DIPSTICK
BILIRUBIN UA: NEGATIVE
GLUCOSE UA: NEGATIVE
Nitrite, UA: NEGATIVE
Protein, UA: NEGATIVE
SPEC GRAV UA: 1.025
Urobilinogen, UA: 0.2
pH, UA: 5.5

## 2016-12-17 LAB — POCT URINE PREGNANCY: Preg Test, Ur: NEGATIVE

## 2016-12-17 MED ORDER — SULFAMETHOXAZOLE-TRIMETHOPRIM 800-160 MG PO TABS: 1.0000 | ORAL_TABLET | Freq: Two times a day (BID) | ORAL | 0 refills | Status: DC

## 2016-12-17 MED ORDER — SULFAMETHOXAZOLE-TRIMETHOPRIM 800-160 MG PO TABS: 1.0000 | ORAL_TABLET | Freq: Two times a day (BID) | ORAL | 0 refills | Status: AC

## 2016-12-17 MED FILL — raNITIdine HCL 300 MG TABS: 300 | 30 days supply | Qty: 30 | Fill #1

## 2016-12-17 MED FILL — SULFAMETHOXAZOLE/TMP DS TAB: 800-160 | 7 days supply | Qty: 14 | Fill #0

## 2016-12-17 NOTE — Progress Notes (Signed)
F/u back pain

## 2016-12-17 NOTE — Progress Notes (Signed)
Subjective:  Patient ID: [name], female    DOB: 1971-08-27  Age: 46 y.o. MRN: [id number]  CC: back pain and hematuria follow up  HPI [name] is a 46 y.o. female with a PMH of   Past Medical History:  Diagnosis Date  . GERD (gastroesophageal reflux disease)   . Headache(784.0)   . History of blood transfusion    Hx; of in 1991 after delivery  . History of TIAs   . Numbness    Right hand  . Pneumonia   . Stroke ([place]) 05/2013    that presents for follow up on back pain and hematuria. Reports feeling better since last visit but did have an episode of back pain and lower abdominal pain yesterday. Has been taking her medications as directed. Denies any other symptoms.   Outpatient Medications Prior to Visit  Medication Sig Dispense Refill  . albuterol (PROVENTIL HFA;VENTOLIN HFA) 108 (90 Base) MCG/ACT inhaler Inhale 2 puffs into the lungs every 6 (six) hours as needed for wheezing or shortness of breath. 1 Inhaler 5  . atorvastatin (LIPITOR) 40 MG tablet Take 1 tablet (40 mg total) by mouth daily. 30 tablet 11  . azithromycin (ZITHROMAX) 250 MG tablet 500 mg by mouth today, then 250 mg daily for next 4 days 6 tablet 0  . clopidogrel (PLAVIX) 75 MG tablet Take 1 tablet (75 mg total) by mouth daily. 30 tablet 11  . ferrous sulfate 325 (65 FE) MG tablet Take 1 tablet (325 mg total) by mouth 3 (three) times daily with meals. 90 tablet 5  . fluticasone (FLONASE) 50 MCG/ACT nasal spray Place 2 sprays into both nostrils daily. 16 g 2  . gabapentin (NEURONTIN) 100 MG capsule Take 1 capsule (100 mg total) by mouth 3 (three) times daily. 90 capsule 5  . Multiple Vitamin (MULTIVITAMIN WITH MINERALS) TABS tablet Take 1 tablet by mouth daily. Reported on 11/04/2015    . ranitidine (ZANTAC) 300 MG tablet Take 1 tablet (300 mg total) by mouth at bedtime. 30 tablet 5  . tamsulosin (FLOMAX) 0.4 MG CAPS capsule Take 1 capsule (0.4 mg total) by mouth daily. 7 capsule 0  . traMADol  (ULTRAM) 50 MG tablet Take 1 tablet (50 mg total) by mouth every 8 (eight) hours as needed. 9 tablet 0   No facility-administered medications prior to visit.      ROS Review of Systems  Constitutional: Negative for chills, fever and malaise/fatigue.  Eyes: Negative for blurred vision.  Respiratory: Negative for shortness of breath.   Cardiovascular: Negative for chest pain and palpitations.  Gastrointestinal: Negative for abdominal pain and nausea.  Genitourinary: Negative for dysuria and hematuria.  Musculoskeletal: Positive for back pain (mild yesterday, none currently). Negative for joint pain and myalgias.  Skin: Negative for rash.  Neurological: Negative for tingling and headaches.  Psychiatric/Behavioral: Negative for depression. The patient is not nervous/anxious.     Objective:  BP 118/81 (BP Location: Right Arm, Patient Position: Sitting, Cuff Size: Large)   Pulse 93   Temp 98.5 F (36.9 C) (Oral)   Resp 16   Wt 213 lb 3.2 oz (96.7 kg)   LMP 11/24/2016   SpO2 98%   BMI 37.17 kg/m   BP/Weight 12/17/2016 [id number]  Systolic BP 123456 [id number]  Diastolic BP 81 80 80  Wt. (Lbs) 213.2 210 216.6  BMI 37.17 36.62 37.77      Physical Exam  Constitutional: She is oriented to person, place, and time.  Well developed, well nourished, NAD, polite  HENT:  Head: Normocephalic and atraumatic.  Eyes: No scleral icterus.  Neck: Normal range of motion. Neck supple.  Cardiovascular: Normal rate, regular rhythm and normal heart sounds.   Pulmonary/Chest: Effort normal and breath sounds normal.  Abdominal: Soft. Bowel sounds are normal. She exhibits no distension. There is no tenderness.  Musculoskeletal: She exhibits no edema.  No CVA tenderness bilaterally  Neurological: She is alert and oriented to person, place, and time.  Skin: Skin is warm and dry. No rash noted. No erythema. No pallor.  Psychiatric: She has a normal mood and affect. Her behavior is normal. Thought  content normal.  Vitals reviewed.    Assessment & Plan:   1. Acute left-sided low back pain without sciatica -Pain has drastically subsided with the use of Tamsulosin and Tramadol. However, we will still have patient perform CT renal in light of continued hematuria and the masking of pain with Tramadol. - Urinalysis Dipstick- positive ketone, trace leukocyte, small blood -uHCG- negative  2. Hematuria, unspecified type - Awaiting for CT arrangements to be made. - Urinalysis Dipstick- small blood, increased from trace blood two days ago   Follow-up: Return if symptoms worsen or fail to improve.   Clent Demark PA

## 2016-12-17 NOTE — Patient Instructions (Addendum)
We will call your with appointment details. We will call you again when CT results are reported. Please take your antibiotic. Urine culture results will return in 3-5 days. Please call us if there is any question or concerns.  CT Scan A computed tomography (CT) scan is a specialized X-ray scan. It uses X-rays and a computer to make pictures of different areas of your body. A CT scan can offer more detailed information than a regular X-ray exam. The CT scan provides data about internal organs, soft tissue structures, blood vessels, and bones.  The CT scanner is a large machine that takes pictures of your body as you move through the opening.  LET [facility] CARE PROVIDER KNOW ABOUT:  Any allergies you have.   All medicines you are taking, including vitamins, herbs, eye drops, creams, and over-the-counter medicines.   Previous problems you or members of your family have had with the use of anesthetics.   Any blood disorders you have.   Previous surgeries you have had.   Medical conditions you have. RISKS AND COMPLICATIONS  Generally, this is a safe procedure. However, as with any procedure, problems can occur. Possible problems include:   An allergic reaction to the contrast material.   Development of cancer from excessive exposure to radiation. The risk of this is small.  BEFORE THE PROCEDURE   The day before the test, stop drinking caffeinated beverages. These include energy drinks, tea, soda, coffee, and hot chocolate.   On the day of the test:  About 4 hours before the test, stop eating and drinking anything but water as advised by your health care provider.   Avoid wearing jewelry. You will have to partly or fully undress and wear a hospital gown. PROCEDURE   You will be asked to lie on a table with your arms above your head.   If contrast dye is to be used for the test, an IV tube will be inserted in your arm. The contrast dye will be injected into the IV tube. You  might feel warm, or you may get a metallic taste in your mouth.   The table you will be lying on will move into a large machine that will do the scanning.   You will be able to see, hear, and talk to the person running the machine while you are in it. Follow that person's directions.   The CT machine will move around you to take pictures. Do not move while it is scanning. This helps to get a good image.   When the best possible pictures have been taken, the machine will be turned off. The table will be moved out of the machine. The IV tube will then be removed. AFTER THE PROCEDURE  Ask your health care provider when to follow up for your test results. This information is not intended to replace advice given to you by your health care provider. Make sure you discuss any questions you have with your health care provider. Document Released: 12/02/2004 Document Revised: 10/30/2013 Document Reviewed: 07/02/2013 Elsevier Interactive Patient Education  2017 [employer].

## 2016-12-18 LAB — URINE CULTURE: ORGANISM ID, BACTERIA: NO GROWTH

## 2016-12-24 ENCOUNTER — Other Ambulatory Visit: Payer: Self-pay | Admitting: Physician Assistant

## 2016-12-24 ENCOUNTER — Telehealth: Payer: Self-pay | Admitting: Family Medicine

## 2016-12-24 ENCOUNTER — Ambulatory Visit (HOSPITAL_COMMUNITY)
Admission: RE | Admit: 2016-12-24 | Discharge: 2016-12-24 | Disposition: A | Discharge status: Home / Self Care | Payer: Self-pay | Source: Ambulatory Visit | Attending: Physician Assistant | Admitting: Physician Assistant

## 2016-12-24 DIAGNOSIS — M545 Low back pain, unspecified: Secondary | ICD-10-CM

## 2016-12-24 DIAGNOSIS — R3129 Other microscopic hematuria: Secondary | ICD-10-CM | POA: Insufficient documentation

## 2016-12-24 DIAGNOSIS — K449 Diaphragmatic hernia without obstruction or gangrene: Secondary | ICD-10-CM | POA: Insufficient documentation

## 2016-12-24 DIAGNOSIS — D259 Leiomyoma of uterus, unspecified: Secondary | ICD-10-CM

## 2016-12-24 NOTE — Telephone Encounter (Signed)
I spoke to pt and discussed CT finding with her. GYN referral made.

## 2016-12-24 NOTE — Telephone Encounter (Signed)
Pt calling stating she was sent for a renal stone study CT scan and received the results on her phone and she thinks the results stated she has a rising cancerous mass  Pt requesting call from nurse or provider as soon as possible

## 2017-01-28 ENCOUNTER — Encounter: Payer: Self-pay | Admitting: Obstetrics & Gynecology

## 2017-02-02 ENCOUNTER — Encounter: Payer: Self-pay | Admitting: Obstetrics & Gynecology

## 2017-02-02 ENCOUNTER — Encounter: Payer: Self-pay | Admitting: General Practice

## 2017-03-23 ENCOUNTER — Encounter: Payer: Self-pay | Admitting: Family Medicine

## 2017-04-05 ENCOUNTER — Other Ambulatory Visit: Payer: Self-pay | Admitting: Family Medicine

## 2017-04-05 DIAGNOSIS — J209 Acute bronchitis, unspecified: Secondary | ICD-10-CM

## 2017-04-12 MED ORDER — AZITHROMYCIN 250 MG PO TABS: ORAL_TABLET | ORAL | 0 refills | Status: DC

## 2017-04-12 NOTE — Telephone Encounter (Signed)
Refill request for antibiotic

## 2017-04-13 MED FILL — AZITHROMYCIN 250 MG TABLET: 250 | 5 days supply | Qty: 6 | Fill #0

## 2017-06-02 ENCOUNTER — Other Ambulatory Visit: Payer: Self-pay | Admitting: Family Medicine

## 2017-06-02 ENCOUNTER — Ambulatory Visit: Payer: PRIVATE HEALTH INSURANCE | Attending: Family Medicine | Admitting: Family Medicine

## 2017-06-02 ENCOUNTER — Encounter: Payer: Self-pay | Admitting: Family Medicine

## 2017-06-02 VITALS — BP 130/76 | HR 100 | Temp 98.6°F | Ht 63.5 in | Wt 202.0 lb

## 2017-06-02 DIAGNOSIS — J209 Acute bronchitis, unspecified: Secondary | ICD-10-CM

## 2017-06-02 DIAGNOSIS — R400 Somnolence: Secondary | ICD-10-CM | POA: Insufficient documentation

## 2017-06-02 DIAGNOSIS — R05 Cough: Secondary | ICD-10-CM | POA: Insufficient documentation

## 2017-06-02 DIAGNOSIS — D509 Iron deficiency anemia, unspecified: Secondary | ICD-10-CM | POA: Diagnosis not present

## 2017-06-02 DIAGNOSIS — Z8673 Personal history of transient ischemic attack (TIA), and cerebral infarction without residual deficits: Secondary | ICD-10-CM | POA: Diagnosis not present

## 2017-06-02 DIAGNOSIS — Z87891 Personal history of nicotine dependence: Secondary | ICD-10-CM | POA: Insufficient documentation

## 2017-06-02 DIAGNOSIS — J440 Chronic obstructive pulmonary disease with acute lower respiratory infection: Secondary | ICD-10-CM | POA: Diagnosis not present

## 2017-06-02 DIAGNOSIS — R51 Headache: Secondary | ICD-10-CM

## 2017-06-02 DIAGNOSIS — R519 Headache, unspecified: Secondary | ICD-10-CM

## 2017-06-02 DIAGNOSIS — Z79899 Other long term (current) drug therapy: Secondary | ICD-10-CM | POA: Diagnosis not present

## 2017-06-02 MED ORDER — ALBUTEROL SULFATE HFA 108 (90 BASE) MCG/ACT IN AERS: 2.0000 | INHALATION_SPRAY | Freq: Four times a day (QID) | RESPIRATORY_TRACT | 5 refills | Status: DC | PRN

## 2017-06-02 MED ORDER — PROMETHAZINE-DM 6.25-15 MG/5ML PO SYRP: 5.0000 mL | ORAL_SOLUTION | Freq: Four times a day (QID) | ORAL | 0 refills | Status: AC | PRN

## 2017-06-02 MED ORDER — DOXYCYCLINE HYCLATE 100 MG PO TABS
100.0000 mg | ORAL_TABLET | Freq: Two times a day (BID) | ORAL | 0 refills | Status: AC
Start: 2017-06-02 — End: 2017-06-12

## 2017-06-02 MED FILL — DOXYCYCLINE 100 MG TABLET: 100 | 10 days supply | Qty: 20 | Fill #0

## 2017-06-02 NOTE — Assessment & Plan Note (Signed)
Cough for 10 days, no improvement COPD at baseline Plan: Doxycycline Albuterol prn Promethazine dextromethorphan

## 2017-06-02 NOTE — Assessment & Plan Note (Addendum)
CBC and iron studies If iron stores remain low will set up IV iron  Iron stores are severely low feraheme infusion will be set up

## 2017-06-02 NOTE — Assessment & Plan Note (Signed)
Morning headache for past year She does endorse snoring and daytime somnolence She could have sleep apnea  Sleep study ordered

## 2017-06-02 NOTE — Progress Notes (Signed)
Subjective:  Patient ID: [name], female    DOB: 10/23/1971  Age: 46 y.o. MRN: [id number]  CC: Cough; Headache; and Anemia   HPI [name] is a former smoker, she has COPD, history of CVA (05/2013) she  presents for   1. Cough: x 10 days. Sometimes productive of white sputum. Cough is associated with near post tussive emesis. She has scratchy throat. itching in R ear. No known sick contacts. No fever or chills. No chest pain or shortness of breath. Using albuterol lately. She is using it twice daily without significant improvement. She completed a course of azithromycin last month.  2. Morning headache: every morning for the past year. Resolved with Excedrin. She reports snoring for 1-2 months. She has daytime somnolence. She also has known iron deficiency anemia.   3. Iron deficiency anemia: taking daily iron. Has fatigue.   Social History  Substance Use Topics  . Smoking status: Former Smoker    Packs/day: 0.00    Years: 26.00    Types: Cigarettes    Quit date: 10/09/2015  . Smokeless tobacco: Former Systems developer  . Alcohol use No    Outpatient Medications Prior to Visit  Medication Sig Dispense Refill  . albuterol (PROVENTIL HFA;VENTOLIN HFA) 108 (90 Base) MCG/ACT inhaler Inhale 2 puffs into the lungs every 6 (six) hours as needed for wheezing or shortness of breath. 1 Inhaler 5  . atorvastatin (LIPITOR) 40 MG tablet Take 1 tablet (40 mg total) by mouth daily. 30 tablet 11  . azithromycin (ZITHROMAX) 250 MG tablet 500 mg by mouth today, then 250 mg daily for next 4 days 6 tablet 0  . clopidogrel (PLAVIX) 75 MG tablet Take 1 tablet (75 mg total) by mouth daily. 30 tablet 11  . ferrous sulfate 325 (65 FE) MG tablet Take 1 tablet (325 mg total) by mouth 3 (three) times daily with meals. 90 tablet 5  . fluticasone (FLONASE) 50 MCG/ACT nasal spray Place 2 sprays into both nostrils daily. 16 g 2  . gabapentin (NEURONTIN) 100 MG capsule Take 1 capsule (100 mg total) by mouth 3  (three) times daily. 90 capsule 5  . Multiple Vitamin (MULTIVITAMIN WITH MINERALS) TABS tablet Take 1 tablet by mouth daily. Reported on 11/04/2015    . ranitidine (ZANTAC) 300 MG tablet Take 1 tablet (300 mg total) by mouth at bedtime. 30 tablet 5   No facility-administered medications prior to visit.     ROS Review of Systems  Constitutional: Positive for fatigue. Negative for chills and fever.  HENT: Positive for sore throat.   Eyes: Negative for visual disturbance.  Respiratory: Positive for cough. Negative for shortness of breath.   Cardiovascular: Negative for chest pain.  Gastrointestinal: Negative for abdominal pain and blood in stool.  Musculoskeletal: Negative for arthralgias and back pain.  Skin: Negative for rash.  Allergic/Immunologic: Negative for immunocompromised state.  Neurological: Positive for headaches.  Hematological: Negative for adenopathy. Does not bruise/bleed easily.  Psychiatric/Behavioral: Negative for dysphoric mood and suicidal ideas.  All other systems reviewed and are negative.   Objective:  BP 130/76   Pulse 100   Temp 98.6 F (37 C) (Oral)   Ht 5' 3.5" (1.613 m)   Wt 202 lb (91.6 kg)   SpO2 98%   BMI 35.22 kg/m   BP/Weight 06/02/2017 05/12/1699 11/14/4942  Systolic BP 967 591 638  Diastolic BP 76 81 80  Wt. (Lbs) 202 213.2 210  BMI 35.22 37.17 36.62   Physical Exam  Constitutional: She is oriented to person, place, and time. She appears well-developed and well-nourished. No distress.  HENT:  Head: Normocephalic and atraumatic.  Right Ear: Tympanic membrane, external ear and ear canal normal.  Left Ear: Tympanic membrane, external ear and ear canal normal.  Nose: No mucosal edema.  Mouth/Throat: Oropharynx is clear and moist.  Cardiovascular: Normal rate, regular rhythm, normal heart sounds and intact distal pulses.   Pulmonary/Chest: Effort normal and breath sounds normal.  Musculoskeletal: She exhibits no edema.  Neurological: She is  alert and oriented to person, place, and time.  Skin: Skin is warm and dry. No rash noted.  Psychiatric: She has a normal mood and affect.     Assessment & Plan:  [name] was seen today for cough, headache and anemia.  Diagnoses and all orders for this visit:  Iron deficiency anemia, unspecified iron deficiency anemia type -     CBC -     Iron and TIBC -     Ferritin  Acute bronchitis, unspecified organism -     doxycycline (VIBRA-TABS) 100 MG tablet; Take 1 tablet (100 mg total) by mouth 2 (two) times daily. -     promethazine-dextromethorphan (PROMETHAZINE-DM) 6.25-15 MG/5ML syrup; Take 5 mLs by mouth 4 (four) times daily as needed for cough. -     albuterol (PROVENTIL HFA;VENTOLIN HFA) 108 (90 Base) MCG/ACT inhaler; Inhale 2 puffs into the lungs every 6 (six) hours as needed for wheezing or shortness of breath.  Morning headache -     Split night study; Future  There are no diagnoses linked to this encounter.  No orders of the defined types were placed in this encounter.   Follow-up: Return in about 3 months (around 09/02/2017) for COPD. flu shot.   Boykin Nearing MD

## 2017-06-02 NOTE — Patient Instructions (Addendum)
[name] was seen today for cough, headache and anemia.  Diagnoses and all orders for this visit:  Iron deficiency anemia, unspecified iron deficiency anemia type -     CBC -     Iron and TIBC -     Ferritin  Acute bronchitis, unspecified organism -     doxycycline (VIBRA-TABS) 100 MG tablet; Take 1 tablet (100 mg total) by mouth 2 (two) times daily. -     promethazine-dextromethorphan (PROMETHAZINE-DM) 6.25-15 MG/5ML syrup; Take 5 mLs by mouth 4 (four) times daily as needed for cough.   You will be called with lab results If iron stores are still low you will be set up for IV iron infusion Sleep study may be needed to evaluate cause for morning headache  Please take lipitor, given your stroke history lipitor and plavix are recommended to prevent another stroke   F/u in 3 months for COPD and flu shot sooner if needed  Dr. Adrian Blackwater

## 2017-06-03 ENCOUNTER — Encounter: Payer: Self-pay | Admitting: Family Medicine

## 2017-06-03 ENCOUNTER — Telehealth: Payer: Self-pay

## 2017-06-03 LAB — CBC
HEMATOCRIT: 26.3 % — AB (ref 34.0–46.6)
HEMOGLOBIN: 7.4 g/dL — AB (ref 11.1–15.9)
MCH: 16.8 pg — ABNORMAL LOW (ref 26.6–33.0)
MCHC: 28.1 g/dL — ABNORMAL LOW (ref 31.5–35.7)
MCV: 60 fL — ABNORMAL LOW (ref 79–97)
PLATELETS: 390 10*3/uL — AB (ref 150–379)
RBC: 4.41 x10E6/uL (ref 3.77–5.28)
RDW: 21 % — AB (ref 12.3–15.4)
WBC: 10.4 10*3/uL (ref 3.4–10.8)

## 2017-06-03 LAB — IRON AND TIBC
Iron Saturation: 2 % — CL (ref 15–55)
Iron: 10 ug/dL — ABNORMAL LOW (ref 27–159)
TIBC: 439 ug/dL (ref 250–450)
UIBC: 429 ug/dL — ABNORMAL HIGH (ref 131–425)

## 2017-06-03 LAB — FERRITIN: Ferritin: 3 ng/mL — ABNORMAL LOW (ref 15–150)

## 2017-06-03 MED ORDER — FERUMOXYTOL INJECTION 510 MG/17 ML: 510.0000 mg | INTRAVENOUS | 0 refills | Status: DC

## 2017-06-03 NOTE — Telephone Encounter (Signed)
Pt was called and informed of lab results. 

## 2017-06-03 NOTE — Addendum Note (Signed)
Addended by: Boykin Nearing on: 06/03/2017 02:00 PM   Modules accepted: Orders

## 2017-06-08 ENCOUNTER — Other Ambulatory Visit (HOSPITAL_COMMUNITY): Payer: Self-pay

## 2017-06-09 ENCOUNTER — Encounter (HOSPITAL_COMMUNITY)
Admission: RE | Admit: 2017-06-09 | Discharge: 2017-06-09 | Disposition: A | Discharge status: Home / Self Care | Payer: PRIVATE HEALTH INSURANCE | Source: Ambulatory Visit | Attending: Family Medicine | Admitting: Family Medicine

## 2017-06-09 DIAGNOSIS — D509 Iron deficiency anemia, unspecified: Secondary | ICD-10-CM | POA: Diagnosis not present

## 2017-06-09 MED ORDER — SODIUM CHLORIDE 0.9 % IV SOLN
510.0000 mg | INTRAVENOUS | Status: DC
  Administered 2017-06-09: 12:00:00 510 mg via INTRAVENOUS
  Filled 2017-06-09: qty 17

## 2017-06-09 NOTE — Discharge Instructions (Signed)

## 2017-06-15 ENCOUNTER — Other Ambulatory Visit (HOSPITAL_COMMUNITY): Payer: Self-pay | Admitting: *Deleted

## 2017-06-16 ENCOUNTER — Encounter (HOSPITAL_COMMUNITY)
Admission: RE | Admit: 2017-06-16 | Discharge: 2017-06-16 | Disposition: A | Discharge status: Home / Self Care | Payer: PRIVATE HEALTH INSURANCE | Source: Ambulatory Visit | Attending: Family Medicine | Admitting: Family Medicine

## 2017-06-16 DIAGNOSIS — D509 Iron deficiency anemia, unspecified: Secondary | ICD-10-CM | POA: Diagnosis not present

## 2017-06-16 MED ORDER — SODIUM CHLORIDE 0.9 % IV SOLN
510.0000 mg | Freq: Once | INTRAVENOUS | Status: AC
  Administered 2017-06-16: 08:00:00 510 mg via INTRAVENOUS
  Filled 2017-06-16: qty 17

## 2017-07-13 ENCOUNTER — Encounter: Payer: PRIVATE HEALTH INSURANCE | Admitting: Obstetrics & Gynecology

## 2017-07-13 ENCOUNTER — Encounter: Payer: Self-pay | Admitting: Obstetrics & Gynecology

## 2017-07-22 ENCOUNTER — Encounter (HOSPITAL_BASED_OUTPATIENT_CLINIC_OR_DEPARTMENT_OTHER): Payer: PRIVATE HEALTH INSURANCE

## 2017-08-23 ENCOUNTER — Encounter (HOSPITAL_BASED_OUTPATIENT_CLINIC_OR_DEPARTMENT_OTHER): Payer: PRIVATE HEALTH INSURANCE

## 2017-09-02 ENCOUNTER — Ambulatory Visit: Payer: PRIVATE HEALTH INSURANCE | Admitting: Internal Medicine

## 2018-01-06 ENCOUNTER — Other Ambulatory Visit: Payer: Self-pay

## 2018-01-06 ENCOUNTER — Emergency Department (HOSPITAL_COMMUNITY)
Admission: EM | Admit: 2018-01-06 | Discharge: 2018-01-06 | Disposition: A | Discharge status: Home / Self Care | Payer: PRIVATE HEALTH INSURANCE | Attending: Emergency Medicine | Admitting: Emergency Medicine

## 2018-01-06 ENCOUNTER — Encounter (HOSPITAL_COMMUNITY): Payer: Self-pay

## 2018-01-06 DIAGNOSIS — R110 Nausea: Secondary | ICD-10-CM | POA: Diagnosis not present

## 2018-01-06 DIAGNOSIS — R102 Pelvic and perineal pain: Secondary | ICD-10-CM | POA: Diagnosis not present

## 2018-01-06 DIAGNOSIS — B9689 Other specified bacterial agents as the cause of diseases classified elsewhere: Secondary | ICD-10-CM | POA: Diagnosis not present

## 2018-01-06 DIAGNOSIS — N898 Other specified noninflammatory disorders of vagina: Secondary | ICD-10-CM | POA: Diagnosis present

## 2018-01-06 DIAGNOSIS — Z87891 Personal history of nicotine dependence: Secondary | ICD-10-CM | POA: Diagnosis not present

## 2018-01-06 DIAGNOSIS — N760 Acute vaginitis: Secondary | ICD-10-CM

## 2018-01-06 LAB — CBC
HCT: 35.3 % — ABNORMAL LOW (ref 36.0–46.0)
Hemoglobin: 11 g/dL — ABNORMAL LOW (ref 12.0–15.0)
MCH: 24.8 pg — ABNORMAL LOW (ref 26.0–34.0)
MCHC: 31.2 g/dL (ref 30.0–36.0)
MCV: 79.5 fL (ref 78.0–100.0)
Platelets: 341 10*3/uL (ref 150–400)
RBC: 4.44 MIL/uL (ref 3.87–5.11)
RDW: 16.3 % — ABNORMAL HIGH (ref 11.5–15.5)
WBC: 7 10*3/uL (ref 4.0–10.5)

## 2018-01-06 LAB — COMPREHENSIVE METABOLIC PANEL
ALT: 11 U/L — ABNORMAL LOW (ref 14–54)
AST: 16 U/L (ref 15–41)
Albumin: 4.1 g/dL (ref 3.5–5.0)
Alkaline Phosphatase: 64 U/L (ref 38–126)
Anion gap: 9 (ref 5–15)
BUN: 14 mg/dL (ref 6–20)
CO2: 25 mmol/L (ref 22–32)
Calcium: 9.1 mg/dL (ref 8.9–10.3)
Chloride: 104 mmol/L (ref 101–111)
Creatinine, Ser: 0.7 mg/dL (ref 0.44–1.00)
GFR calc Af Amer: >60 mL/min (ref >60)
GFR calc non Af Amer: >60 mL/min (ref >60)
Glucose, Bld: 101 mg/dL — ABNORMAL HIGH (ref 65–99)
Potassium: 3.8 mmol/L (ref 3.5–5.1)
Sodium: 138 mmol/L (ref 135–145)
Total Bilirubin: 0.5 mg/dL (ref 0.3–1.2)
Total Protein: 7.6 g/dL (ref 6.5–8.1)

## 2018-01-06 LAB — WET PREP, GENITAL
Sperm: NONE SEEN
Trich, Wet Prep: NONE SEEN
Yeast Wet Prep HPF POC: NONE SEEN

## 2018-01-06 LAB — URINALYSIS, ROUTINE W REFLEX MICROSCOPIC
Bilirubin Urine: NEGATIVE
Glucose, UA: NEGATIVE mg/dL
Hgb urine dipstick: NEGATIVE
Ketones, ur: NEGATIVE mg/dL
Leukocytes, UA: NEGATIVE
Nitrite: NEGATIVE
Protein, ur: NEGATIVE mg/dL
Specific Gravity, Urine: 1.029 (ref 1.005–1.030)
pH: 5 (ref 5.0–8.0)

## 2018-01-06 LAB — I-STAT BETA HCG BLOOD, ED (MC, WL, AP ONLY): I-stat hCG, quantitative: <5 m[IU]/mL (ref <5)

## 2018-01-06 LAB — LIPASE, BLOOD: Lipase: 34 U/L (ref 11–51)

## 2018-01-06 MED ORDER — METRONIDAZOLE 500 MG PO TABS: 500.0000 mg | ORAL_TABLET | Freq: Two times a day (BID) | ORAL | 0 refills | Status: DC

## 2018-01-06 NOTE — ED Provider Notes (Signed)
[place] DEPT Provider Note   CSN: [id number] Arrival date & time: 01/06/18  0707     History   Chief Complaint Chief Complaint  Patient presents with  . Abdominal Pain  . possible foreign object  . Nausea    HPI [name] is a 47 y.o. female.  HPI 46yF with vaginal discharge/odor. Onset about a week ago. Persistent since then. Some vague lower abdominal/pelvic discomfort. No vaginal bleeding.  She is concerned that she may have retained tampon from about a week ago.  Past Medical History:  Diagnosis Date  . GERD (gastroesophageal reflux disease)   . Headache(784.0)   . History of blood transfusion    Hx; of in 1991 after delivery  . History of TIAs   . Numbness    Right hand  . Pneumonia   . Stroke [facility]) 05/2013    Patient Active Problem List   Diagnosis Date Noted  . Morning headache 06/02/2017  . Leiomyoma of body of uterus 12/24/2016  . Acute bronchitis 11/11/2016  . Prediabetes 11/11/2016  . Pap smear abnormality of cervix with ASCUS favoring benign 01/01/2016  . COPD (chronic obstructive pulmonary disease) ([place]) 11/18/2015  . GERD (gastroesophageal reflux disease) 11/18/2015  . Abnormal imaging of thyroid 11/04/2015  . Elevated d-dimer 11/01/2015  . IDA (iron deficiency anemia) 09/04/2015  . 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 S/P cervical spinal fusion 03/06/2014  . Obesity 02/22/2014  . Radicular pain in right arm 01/11/2014  . 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 Other and unspecified hyperlipidemia 08/24/2013  . History of CVA (cerebrovascular accident) 05/18/2013  . 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 ANTERIOR CERVICAL DECOMP/DISCECTOMY FUSION N/A 03/06/2014   Procedure: ANTERIOR CERVICAL DECOMPRESSION/DISCECTOMY FUSION 2 LEVELS four/five, five/six;  Surgeon: Eustace Moore, MD;  Location: [facility] NEURO ORS;  Service: Neurosurgery;  Laterality:  N/A;  . CESAREAN SECTION     x 1 with 5th pregnancy  . CHOLECYSTECTOMY    . TEE WITHOUT CARDIOVERSION N/A 05/21/2013   Procedure: TRANSESOPHAGEAL ECHOCARDIOGRAM (TEE);  Surgeon: Sueanne Margarita, MD;  Location: [facility] ENDOSCOPY;  Service: Cardiovascular;  Laterality: N/A;  . TUBAL LIGATION      OB History    No data available       Home Medications    Prior to Admission medications   Medication Sig Start Date End Date Taking? Authorizing Provider  aspirin-acetaminophen-caffeine (EXCEDRIN MIGRAINE) [phone] MG tablet Take 2 tablets by mouth daily as needed for headache (PAIN).   Yes [provider]  Omeprazole 20 MG TBEC Take 20 mg by mouth daily.   Yes [provider]  albuterol (PROVENTIL HFA;VENTOLIN HFA) 108 (90 Base) MCG/ACT inhaler Inhale 2 puffs into the lungs every 6 (six) hours as needed for wheezing or shortness of breath. Patient not taking: Reported on 01/06/2018 06/02/17   Boykin Nearing, MD  atorvastatin (LIPITOR) 40 MG tablet Take 1 tablet (40 mg total) by mouth daily. Patient not taking: Reported on 06/02/2017 11/11/16   Boykin Nearing, MD  clopidogrel (PLAVIX) 75 MG tablet Take 1 tablet (75 mg total) by mouth daily. Patient not taking: Reported on 01/06/2018 11/11/16   Boykin Nearing, MD  ferrous sulfate 325 (65 FE) MG tablet Take 1 tablet (325 mg total) by mouth 3 (three) times daily with meals. Patient not taking: Reported on 01/06/2018 09/10/15   Boykin Nearing, MD  ferumoxytol [facility]) 510 MG/17ML SOLN injection Inject  17 mLs (510 mg total) into the vein once a week. Patient not taking: Reported on 01/06/2018 06/03/17 06/11/17  Boykin Nearing, MD  fluticasone (FLONASE) 50 MCG/ACT nasal spray Place 2 sprays into both nostrils daily. Patient not taking: Reported on 01/06/2018 07/16/16   Boykin Nearing, MD  gabapentin (NEURONTIN) 100 MG capsule Take 1 capsule (100 mg total) by mouth 3 (three) times daily. Patient not taking: Reported on 01/06/2018 11/11/16    Boykin Nearing, MD  ranitidine (ZANTAC) 300 MG tablet Take 1 tablet (300 mg total) by mouth at bedtime. Patient not taking: Reported on 01/06/2018 11/11/16   Boykin Nearing, MD    Family History Family History  Problem Relation Age of Onset  . Renal Disease Father        dialysis  . Hypertension Father   . Heart disease Mother   . Heart disease Maternal Grandfather   . Asthma Sister   . Asthma Maternal Aunt     Social History Social History   Tobacco Use  . Smoking status: Former Smoker    Packs/day: 0.00    Years: 26.00    Pack years: 0.00    Types: Cigarettes    Last attempt to quit: 10/09/2015    Years since quitting: 2.2  . Smokeless tobacco: Former Network engineer Use Topics  . Alcohol use: No    Alcohol/week: 0.0 oz  . Drug use: No     Allergies   Shellfish allergy and Latex   Review of Systems Review of Systems  All systems reviewed and negative, other than as noted in HPI.  Physical Exam Updated Vital Signs BP (!) 138/92   Pulse 77   Temp 98.3 F (36.8 C) (Oral)   Resp 15   Ht 5' 3.5" (1.613 m)   Wt 88.9 kg (196 lb)   LMP 12/25/2017 (Approximate)   SpO2 100%   BMI 34.18 kg/m   Physical Exam  Constitutional: She appears well-developed and well-nourished. No distress.  HENT:  Head: Normocephalic and atraumatic.  Eyes: Conjunctivae are normal. Right eye exhibits no discharge. Left eye exhibits no discharge.  Neck: Neck supple.  Cardiovascular: Normal rate, regular rhythm and normal heart sounds. Exam reveals no gallop and no friction rub.  No murmur heard. Pulmonary/Chest: Effort normal and breath sounds normal. No respiratory distress.  Abdominal: Soft. She exhibits no distension. There is no tenderness.  Genitourinary:  Genitourinary Comments: Chaperone Manuela Schwartz, RN) present. Normal external genitalia. This white/grey discharge. No concerning lesions noted.   Musculoskeletal: She exhibits no edema or tenderness.  Neurological: She is alert.    Skin: Skin is warm and dry.  Psychiatric: She has a normal mood and affect. Her behavior is normal. Thought content normal.  Nursing note and vitals reviewed.    ED Treatments / Results  Labs (all labs ordered are listed, but only abnormal results are displayed) Labs Reviewed  WET PREP, GENITAL - Abnormal; Notable for the following components:      Result Value   Clue Cells Wet Prep HPF POC PRESENT (*)    WBC, Wet Prep HPF POC MANY (*)    All other components within normal limits  COMPREHENSIVE METABOLIC PANEL - Abnormal; Notable for the following components:   Glucose, Bld 101 (*)    ALT 11 (*)    All other components within normal limits  CBC - Abnormal; Notable for the following components:   Hemoglobin 11.0 (*)    HCT 35.3 (*)    MCH 24.8 (*)  RDW 16.3 (*)    All other components within normal limits  LIPASE, BLOOD  URINALYSIS, ROUTINE W REFLEX MICROSCOPIC  I-STAT BETA HCG BLOOD, ED ([facility], [facility], [facility] ONLY)  GC/CHLAMYDIA PROBE AMP ([place]) NOT AT [facility]    EKG  EKG Interpretation None       Radiology No results found.  Procedures Procedures (including critical care time)  Medications Ordered in ED Medications - No data to display   Initial Impression / Assessment and Plan / ED Course  I have reviewed the triage vital signs and the nursing notes.  Pertinent labs & imaging results that were available during my care of the patient were reviewed by me and considered in my medical decision making (see chart for details).     46yF with vaginal discharge/odor. No FB noted. Will tx for BV. Declined empiric GC tx. Return precautions discussed. PCP FU otherwise.   Final Clinical Impressions(s) / ED Diagnoses   Final diagnoses:  Bacterial vaginosis    ED Discharge Orders    None       Virgel Manifold, MD 01/08/18 1528

## 2018-01-06 NOTE — ED Triage Notes (Signed)
Patient c/o lowe abdominal pain, nausea, and a vaginal odor x 1 week. Patient stated, "I may have a tampon up in me."

## 2018-01-09 LAB — GC/CHLAMYDIA PROBE AMP (~~LOC~~) NOT AT ARMC
Chlamydia: NEGATIVE
Neisseria Gonorrhea: NEGATIVE

## 2018-01-20 ENCOUNTER — Emergency Department (HOSPITAL_COMMUNITY)
Admission: EM | Admit: 2018-01-20 | Discharge: 2018-01-20 | Disposition: A | Discharge status: Home / Self Care | Payer: PRIVATE HEALTH INSURANCE | Attending: Emergency Medicine | Admitting: Emergency Medicine

## 2018-01-20 ENCOUNTER — Encounter (HOSPITAL_COMMUNITY): Payer: Self-pay | Admitting: Emergency Medicine

## 2018-01-20 ENCOUNTER — Emergency Department (HOSPITAL_COMMUNITY): Payer: PRIVATE HEALTH INSURANCE

## 2018-01-20 ENCOUNTER — Other Ambulatory Visit: Payer: Self-pay

## 2018-01-20 DIAGNOSIS — Z7902 Long term (current) use of antithrombotics/antiplatelets: Secondary | ICD-10-CM | POA: Insufficient documentation

## 2018-01-20 DIAGNOSIS — N938 Other specified abnormal uterine and vaginal bleeding: Secondary | ICD-10-CM | POA: Insufficient documentation

## 2018-01-20 DIAGNOSIS — N939 Abnormal uterine and vaginal bleeding, unspecified: Secondary | ICD-10-CM

## 2018-01-20 DIAGNOSIS — R1030 Lower abdominal pain, unspecified: Secondary | ICD-10-CM | POA: Insufficient documentation

## 2018-01-20 DIAGNOSIS — Z8673 Personal history of transient ischemic attack (TIA), and cerebral infarction without residual deficits: Secondary | ICD-10-CM | POA: Insufficient documentation

## 2018-01-20 DIAGNOSIS — Z87891 Personal history of nicotine dependence: Secondary | ICD-10-CM | POA: Diagnosis not present

## 2018-01-20 DIAGNOSIS — Z91013 Allergy to seafood: Secondary | ICD-10-CM | POA: Diagnosis not present

## 2018-01-20 DIAGNOSIS — Z91040 Latex allergy status: Secondary | ICD-10-CM | POA: Insufficient documentation

## 2018-01-20 DIAGNOSIS — M545 Low back pain: Secondary | ICD-10-CM | POA: Diagnosis not present

## 2018-01-20 DIAGNOSIS — Z79899 Other long term (current) drug therapy: Secondary | ICD-10-CM | POA: Insufficient documentation

## 2018-01-20 DIAGNOSIS — D259 Leiomyoma of uterus, unspecified: Secondary | ICD-10-CM

## 2018-01-20 DIAGNOSIS — J449 Chronic obstructive pulmonary disease, unspecified: Secondary | ICD-10-CM | POA: Insufficient documentation

## 2018-01-20 LAB — CBC
HCT: 35.5 % — ABNORMAL LOW (ref 36.0–46.0)
Hemoglobin: 11.1 g/dL — ABNORMAL LOW (ref 12.0–15.0)
MCH: 24.4 pg — AB (ref 26.0–34.0)
MCHC: 31.3 g/dL (ref 30.0–36.0)
MCV: 78.2 fL (ref 78.0–100.0)
PLATELETS: 264 10*3/uL (ref 150–400)
RBC: 4.54 MIL/uL (ref 3.87–5.11)
RDW: 16.3 % — AB (ref 11.5–15.5)
WBC: 7.6 10*3/uL (ref 4.0–10.5)

## 2018-01-20 LAB — I-STAT BETA HCG BLOOD, ED (MC, WL, AP ONLY)

## 2018-01-20 MED ORDER — IBUPROFEN 200 MG PO TABS
600.0000 mg | ORAL_TABLET | Freq: Once | ORAL | Status: AC
  Administered 2018-01-20: 600 mg via ORAL
  Filled 2018-01-20: qty 3

## 2018-01-20 MED ORDER — ACETAMINOPHEN 325 MG PO TABS
650.0000 mg | ORAL_TABLET | Freq: Once | ORAL | Status: AC
  Administered 2018-01-20: 650 mg via ORAL
  Filled 2018-01-20: qty 2

## 2018-01-20 NOTE — ED Provider Notes (Signed)
[facility] DEPT Provider Note   CSN: [id number] Arrival date & time: 01/20/18  0540     History   Chief Complaint Chief Complaint  Patient presents with  . Vaginal Bleeding    HPI [name] is a 47 y.o. female.  HPI 47 year old female presents the emergency department complaints of abnormal vaginal bleeding.  She was told a year ago that she had a uterine fibroid.  She has not followed up with gynecology.  She reports no fevers or chills.  No vaginal discharge.  Reports crampy lower abdominal pain.  She states she had a normal menstrual cycle early in the month and it stopped but then is restarted yesterday.  She reports some crampy lower abdominal pain and low back pain.  She contacted a telemedicine specialist who recommended that she come to the ER for evaluation.   Past Medical History:  Diagnosis Date  . Anemia   . GERD (gastroesophageal reflux disease)   . Headache(784.0)   . History of blood transfusion    Hx; of in 1991 after delivery  . History of TIAs   . Numbness    Right hand  . Pneumonia   . Stroke [facility]) 05/2013    Patient Active Problem List   Diagnosis Date Noted  . Morning headache 06/02/2017  . Leiomyoma of body of uterus 12/24/2016  . Acute bronchitis 11/11/2016  . Prediabetes 11/11/2016  . Pap smear abnormality of cervix with ASCUS favoring benign 01/01/2016  . COPD (chronic obstructive pulmonary disease) ([facility]) 11/18/2015  . GERD (gastroesophageal reflux disease) 11/18/2015  . Abnormal imaging of thyroid 11/04/2015  . Elevated d-dimer 11/01/2015  . IDA (iron deficiency anemia) 09/04/2015  . 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 S/P cervical spinal fusion 03/06/2014  . Obesity 02/22/2014  . Radicular pain in right arm 01/11/2014  . 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 Other and unspecified hyperlipidemia 08/24/2013  . History of CVA (cerebrovascular accident) 05/18/2013  . Tobacco abuse, in  remission 05/18/2013    Past Surgical History:  Procedure Laterality Date  . ANTERIOR CERVICAL DECOMP/DISCECTOMY FUSION N/A 03/06/2014   Procedure: ANTERIOR CERVICAL DECOMPRESSION/DISCECTOMY FUSION 2 LEVELS four/five, five/six;  Surgeon: Eustace Moore, MD;  Location[facility] NEURO ORS;  Service: Neurosurgery;  Laterality: N/A;  . CESAREAN SECTION     x 1 with 5th pregnancy  . CHOLECYSTECTOMY    . TEE WITHOUT CARDIOVERSION N/A 05/21/2013   Procedure: TRANSESOPHAGEAL ECHOCARDIOGRAM (TEE);  Surgeon: Sueanne Margarita, MD;  Location: [facility] ENDOSCOPY;  Service: Cardiovascular;  Laterality: N/A;  . TUBAL LIGATION      OB History    No data available       Home Medications    Prior to Admission medications   Medication Sig Start Date End Date Taking? Authorizing Provider  albuterol (PROVENTIL HFA;VENTOLIN HFA) 108 (90 Base) MCG/ACT inhaler Inhale 2 puffs into the lungs every 6 (six) hours as needed for wheezing or shortness of breath. Patient not taking: Reported on 01/06/2018 06/02/17   Boykin Nearing, MD  aspirin-acetaminophen-caffeine (EXCEDRIN MIGRAINE) [phone] MG tablet Take 2 tablets by mouth daily as needed for headache (PAIN).    [provider]  atorvastatin (LIPITOR) 40 MG tablet Take 1 tablet (40 mg total) by mouth daily. Patient not taking: Reported on 06/02/2017 11/11/16   Boykin Nearing, MD  clopidogrel (PLAVIX) 75 MG tablet Take 1 tablet (75 mg total) by mouth daily. Patient not taking: Reported on 01/06/2018 11/11/16   Boykin Nearing,  MD  ferrous sulfate 325 (65 FE) MG tablet Take 1 tablet (325 mg total) by mouth 3 (three) times daily with meals. Patient not taking: Reported on 01/06/2018 09/10/15   Boykin Nearing, MD  ferumoxytol [facility]) 510 MG/17ML SOLN injection Inject 17 mLs (510 mg total) into the vein once a week. Patient not taking: Reported on 01/06/2018 06/03/17 06/11/17  Boykin Nearing, MD  fluticasone (FLONASE) 50 MCG/ACT nasal spray Place 2 sprays into both nostrils  daily. Patient not taking: Reported on 01/06/2018 07/16/16   Boykin Nearing, MD  gabapentin (NEURONTIN) 100 MG capsule Take 1 capsule (100 mg total) by mouth 3 (three) times daily. Patient not taking: Reported on 01/06/2018 11/11/16   Boykin Nearing, MD  metroNIDAZOLE (FLAGYL) 500 MG tablet Take 1 tablet (500 mg total) by mouth 2 (two) times daily. 01/06/18   Virgel Manifold, MD  Omeprazole 20 MG TBEC Take 20 mg by mouth daily.    [provider]  ranitidine (ZANTAC) 300 MG tablet Take 1 tablet (300 mg total) by mouth at bedtime. Patient not taking: Reported on 01/06/2018 11/11/16   Boykin Nearing, MD    Family History Family History  Problem Relation Age of Onset  . Renal Disease Father        dialysis  . Hypertension Father   . Heart disease Mother   . Heart disease Maternal Grandfather   . Asthma Sister   . Asthma Maternal Aunt     Social History Social History   Tobacco Use  . Smoking status: Former Smoker    Packs/day: 0.00    Years: 26.00    Pack years: 0.00    Types: Cigarettes    Last attempt to quit: 10/09/2015    Years since quitting: 2.2  . Smokeless tobacco: Former Network engineer Use Topics  . Alcohol use: Yes    Alcohol/week: 0.0 oz    Comment: occ  . Drug use: No     Allergies   Shellfish allergy and Latex   Review of Systems Review of Systems  All other systems reviewed and are negative.    Physical Exam Updated Vital Signs BP (!) 131/93 (BP Location: Left Arm)   Pulse 72   Temp 98.1 F (36.7 C) (Oral)   Resp 19   LMP 01/08/2018 (Exact Date)   SpO2 98%   Physical Exam  Constitutional: She is oriented to person, place, and time. She appears well-developed and well-nourished.  HENT:  Head: Normocephalic.  Eyes: EOM are normal.  Neck: Normal range of motion.  Cardiovascular: Normal rate.  Pulmonary/Chest: Effort normal.  Abdominal: She exhibits no distension. There is no tenderness.  Musculoskeletal: Normal range of motion.    Neurological: She is alert and oriented to person, place, and time.  Psychiatric: She has a normal mood and affect.  Nursing note and vitals reviewed.    ED Treatments / Results  Labs (all labs ordered are listed, but only abnormal results are displayed) Labs Reviewed  CBC - Abnormal; Notable for the following components:      Result Value   Hemoglobin 11.1 (*)    HCT 35.5 (*)    MCH 24.4 (*)    RDW 16.3 (*)    All other components within normal limits  I-STAT BETA HCG BLOOD, ED ([facility], [facility], [facility] ONLY)    EKG  EKG Interpretation None       Radiology US Pelvic Complete With Transvaginal  Result Date: 01/20/2018 CLINICAL DATA:  Vaginal bleeding for the past 2  weeks. EXAM: TRANSABDOMINAL AND TRANSVAGINAL ULTRASOUND OF PELVIS TECHNIQUE: Both transabdominal and transvaginal ultrasound examinations of the pelvis were performed. Transabdominal technique was performed for global imaging of the pelvis including uterus, ovaries, adnexal regions, and pelvic cul-de-sac. It was necessary to proceed with endovaginal exam following the transabdominal exam to visualize the uterus, endometrium, and ovaries. COMPARISON:  CT abdomen pelvis dated December 24, 2016. FINDINGS: Uterus Measurements: 10.5 x 7.1 x 7.0 cm. Two fibroids are identified, the largest subserosal and fundal in location, measuring 3.0 x 2.7 x 2.9 cm. The smaller fibroid is intramural in the anterior body/fundus and measures 9 mm. Endometrium Thickness: 15 mm.  No focal abnormality visualized. Right ovary Measurements: 3.5 x 1.5 x 2.2 cm. Normal appearance/no adnexal mass. Left ovary Measurements: 2.5 x 1.9 x 2.0 cm. Normal appearance/no adnexal mass. Other findings No abnormal free fluid. IMPRESSION: 1. Fibroid uterus. 2. Borderline thickened endometrium measuring 15 mm, without focal abnormality. If bleeding remains unresponsive to hormonal or medical therapy, sonohysterogram should be considered for focal lesion work-up. (Ref:  Radiological Reasoning: Algorithmic Workup of Abnormal Vaginal Bleeding with Endovaginal Sonography and Sonohysterography. AJR 2008; [id number]) Electronically Signed   By: Titus Dubin M.D.   On: 01/20/2018 09:29    I personally reviewed the imaging tests through PACS system  I reviewed available ER/hospitalization records through the EMR   Procedures Procedures (including critical care time)  Medications Ordered in ED Medications  ibuprofen (ADVIL,MOTRIN) tablet 600 mg (not administered)  acetaminophen (TYLENOL) tablet 650 mg (not administered)     Initial Impression / Assessment and Plan / ED Course  I have reviewed the triage vital signs and the nursing notes.  Pertinent labs & imaging results that were available during my care of the patient were reviewed by me and considered in my medical decision making (see chart for details).     Overall well-appearing.  Hemoglobin stable.  Will evaluate with ultrasound this time.  She will need gynecology follow-up.  No vaginal pain or vaginal discharge.  No indication for pelvic examination at this time.  Outpatient gyn follow up.   Final Clinical Impressions(s) / ED Diagnoses   Final diagnoses:  Vaginal bleeding    ED Discharge Orders    None       Jola Schmidt, MD 01/20/18 [phone]

## 2018-01-20 NOTE — ED Triage Notes (Signed)
Pt states she had a normal period on the 3rd and it stopped yesterday but then last night at work the bleeding started again  Pt is c/o abd cramping and lower back pain  Pt called the online doctor and was told to come here  Pt has hx of fibroids

## 2018-02-07 ENCOUNTER — Encounter: Payer: PRIVATE HEALTH INSURANCE | Admitting: Obstetrics and Gynecology

## 2019-05-22 ENCOUNTER — Other Ambulatory Visit: Payer: Self-pay | Admitting: *Deleted

## 2019-05-22 DIAGNOSIS — Z20822 Contact with and (suspected) exposure to covid-19: Secondary | ICD-10-CM

## 2019-05-27 LAB — NOVEL CORONAVIRUS, NAA: SARS-CoV-2, NAA: NOT DETECTED

## 2020-02-07 ENCOUNTER — Encounter: Payer: Self-pay | Admitting: Family Medicine

## 2020-02-07 ENCOUNTER — Ambulatory Visit: Payer: PRIVATE HEALTH INSURANCE | Admitting: Family Medicine

## 2020-02-07 VITALS — BP 122/78 | HR 84 | Ht 63.5 in | Wt 185.0 lb

## 2020-02-07 DIAGNOSIS — Z789 Other specified health status: Secondary | ICD-10-CM

## 2020-02-07 NOTE — Patient Instructions (Addendum)
Lab work on Tuesday 4/6 at 8 am. Please come fasting. Nothing to eat or drink but water or black coffee.    Colorectal Cancer Screening  Colorectal cancer screening is a group of tests that are used to check for colorectal cancer before symptoms develop. Colorectal refers to the colon and rectum. The colon and rectum are located at the end of the digestive tract and carry bowel movements out of the body. Who should have screening? All adults starting at age 51 until age 40 should have screening. Your health care provider may recommend screening at age 48. You will have tests every 1-10 years, depending on your results and the type of screening test. You may have screening tests starting at an earlier age, or more frequently than other people, if you have any of the following risk factors:  A personal or family history of colorectal cancer or abnormal growths (polyps).  Inflammatory bowel disease, such as ulcerative colitis or Crohn's disease.  A history of having radiation treatment to the abdomen or pelvic area for cancer.  Colorectal cancer symptoms, such as changes in bowel habits or blood in your stool.  A type of colon cancer syndrome that is passed from parent to child (hereditary), such as: ? Lynch syndrome. ? Familial adenomatous polyposis. ? Turcot syndrome. ? Peutz-Jeghers syndrome. Screening recommendations for adults who are 8-33 years old vary depending on health. How is screening done? There are several types of colorectal screening tests. You may have one or more of the following:  Guaiac-based fecal occult blood testing. For this test, a stool (feces) sample is checked for hidden (occult) blood, which could be a sign of colorectal cancer.  Fecal immunochemical test (FIT). For this test, a stool sample is checked for blood, which could be a sign of colorectal cancer.  Stool DNA test. For this test, a stool sample is checked for blood and changes in DNA that could lead  to colorectal cancer.  Sigmoidoscopy. During this test, a thin, flexible tube with a camera on the end (sigmoidoscope) is used to examine the rectum and the lower colon.  Colonoscopy. During this test, a long, flexible tube with a camera on the end (colonoscope) is used to examine the entire colon and rectum. With a colonoscopy, it is possible to take a sample of tissue (biopsy) and remove small polyps during the test.  Virtual colonoscopy. Instead of a colonoscope, this type of colonoscopy uses X-rays (CT scan) and computers to produce images of the colon and rectum. What are the benefits of screening? Screening reduces your risk for colorectal cancer and can help identify cancer at an early stage, when the cancer can be removed or treated more easily. It is common for polyps to form in the lining of the colon, especially as you age. These polyps may be cancerous or become cancerous over time. Screening can identify these polyps. What are the risks of screening? Each screening test may have different risks.  Stool sample tests have fewer risks than other types of screening tests. However, you may need more tests to confirm results from a stool sample test.  Screening tests that involve X-rays expose you to low levels of radiation, which may slightly increase your cancer risk. The benefit of detecting cancer outweighs the slight increase in risk.  Screening tests such as sigmoidoscopy and colonoscopy may place you at risk for bleeding, intestinal damage, infection, or a reaction to medicines given during the exam. Talk with your health care provider  to understand your risk for colorectal cancer and to make a screening plan that is right for you. Questions to ask your health care provider  When should I start colorectal cancer screening?  What is my risk for colorectal cancer?  How often do I need screening?  Which screening tests do I need?  How do I get my test results?  What do my  results mean? Where to find more information Learn more about colorectal cancer screening from:  The [place]: www.cancer.org  The [employer]: www.cancer.gov Summary  Colorectal cancer screening is a group of tests used to check for colorectal cancer before symptoms develop.  Screening reduces your risk for colorectal cancer and can help identify cancer at an early stage, when the cancer can be removed or treated more easily.  All adults starting at age 67 until age 52 should have screening. Your health care provider may recommend screening at age 48.  You may have screening tests starting at an earlier age, or more frequently than other people, if you have certain risk factors.  Talk with your health care provider to understand your risk for colorectal cancer and to make a screening plan that is right for you. This information is not intended to replace advice given to you by your health care provider. Make sure you discuss any questions you have with your health care provider. Document Revised: 02/14/2019 Document Reviewed: 07/27/2017 Elsevier Patient Education  [place].  Iron-Rich Diet  Iron is a mineral that helps your body to produce hemoglobin. Hemoglobin is a protein in red blood cells that carries oxygen to your body's tissues. Eating too little iron may cause you to feel weak and tired, and it can increase your risk of infection. Iron is naturally found in many foods, and many foods have iron added to them (iron-fortified foods). You may need to follow an iron-rich diet if you do not have enough iron in your body due to certain medical conditions. The amount of iron that you need each day depends on your age, your sex, and any medical conditions you have. Follow instructions from your health care provider or a diet and nutrition specialist (dietitian) about how much iron you should eat each day. What are tips for following this  plan? Reading food labels  Check food labels to see how many milligrams (mg) of iron are in each serving. Cooking  Cook foods in pots and pans that are made from iron.  Take these steps to make it easier for your body to absorb iron from certain foods: ? Soak beans overnight before cooking. ? Soak whole grains overnight and drain them before using. ? Ferment flours before baking, such as by using yeast in bread dough. Meal planning  When you eat foods that contain iron, you should eat them with foods that are high in vitamin C. These include oranges, peppers, tomatoes, potatoes, and mango. Vitamin C helps your body to absorb iron. General information  Take iron supplements only as told by your health care provider. An overdose of iron can be life-threatening. If you were prescribed iron supplements, take them with orange juice or a vitamin C supplement.  When you eat iron-fortified foods or take an iron supplement, you should also eat foods that naturally contain iron, such as meat, poultry, and fish. Eating naturally iron-rich foods helps your body to absorb the iron that is added to other foods or contained in a supplement.  Certain foods and  drinks prevent your body from absorbing iron properly. Avoid eating these foods in the same meal as iron-rich foods or with iron supplements. These foods include: ? Coffee, black tea, and red wine. ? Milk, dairy products, and foods that are high in calcium. ? Beans and soybeans. ? Whole grains. What foods should I eat? Fruits Prunes. Raisins. Eat fruits high in vitamin C, such as oranges, grapefruits, and strawberries, alongside iron-rich foods. Vegetables Spinach (cooked). Green peas. Broccoli. Fermented vegetables. Eat vegetables high in vitamin C, such as leafy greens, potatoes, bell peppers, and tomatoes, alongside iron-rich foods. Grains Iron-fortified breakfast cereal. Iron-fortified whole-wheat bread. Enriched rice. Sprouted  grains. Meats and other proteins Beef liver. Oysters. Beef. Shrimp. [place]. Chicken. [place]. Sardines. Chickpeas. Nuts. Tofu. Pumpkin seeds. Beverages Tomato juice. Fresh orange juice. Prune juice. Hibiscus tea. Fortified instant breakfast shakes. Sweets and desserts Blackstrap molasses. Seasonings and condiments Tahini. Fermented soy sauce. Other foods Wheat germ. The items listed above may not be a complete list of recommended foods and beverages. Contact a dietitian for more information. What foods should I avoid? Grains Whole grains. Bran cereal. Bran flour. Oats. Meats and other proteins Soybeans. Products made from soy protein. Black beans. Lentils. Mung beans. Split peas. Dairy Milk. Cream. Cheese. Yogurt. Cottage cheese. Beverages Coffee. Black tea. Red wine. Sweets and desserts Cocoa. Chocolate. Ice cream. Other foods Basil. Oregano. Large amounts of parsley. The items listed above may not be a complete list of foods and beverages to avoid. Contact a dietitian for more information. Summary  Iron is a mineral that helps your body to produce hemoglobin. Hemoglobin is a protein in red blood cells that carries oxygen to your body's tissues.  Iron is naturally found in many foods, and many foods have iron added to them (iron-fortified foods).  When you eat foods that contain iron, you should eat them with foods that are high in vitamin C. Vitamin C helps your body to absorb iron.  Certain foods and drinks prevent your body from absorbing iron properly, such as whole grains and dairy products. You should avoid eating these foods in the same meal as iron-rich foods or with iron supplements. This information is not intended to replace advice given to you by your health care provider. Make sure you discuss any questions you have with your health care provider. Document Revised: 10/07/2017 Document Reviewed: 09/20/2017 Elsevier Patient Education  2020 [employer].

## 2020-02-07 NOTE — Progress Notes (Signed)
Subjective:     Patient ID: [name], female   DOB: 06-04-1971, 49 y.o.   MRN: [id number]  HPI  [name] presents to the employee health clinic today for her required wellness visit for her insurance. She goes to [facility] for her primary care, but states it has been awhile since she has been for a visit. She reports taking no medications, though she says she is supposed to be taking lipitor and plavix, but states she does not. She will take tylenol or excedrin prn for h/a. She reports pmh of a stroke in 2015 and iron-deficiency anemia. She reports she is feeling well, has lost 20 lbs, is working on making better dietary choices, walks a lot at work. She is due for a mammogram, colonoscopy and pap smear. She has concerns regarding getting muscle cramps throughout the day. Reports they come and go, and occur all over but primarily in her legs. Relieved by walking. Reports she does not drink much water.   Past Medical History:  Diagnosis Date  . Anemia   . GERD (gastroesophageal reflux disease)   . Headache(784.0)   . History of blood transfusion    Hx; of in 1991 after delivery  . History of TIAs   . Numbness    Right hand  . Pneumonia   . Stroke [facility]) 05/2013   Allergies  Allergen Reactions  . Shellfish Allergy Itching and Swelling  . Latex Itching and Rash    Current Outpatient Medications:  .  albuterol (PROVENTIL HFA;VENTOLIN HFA) 108 (90 Base) MCG/ACT inhaler, Inhale 2 puffs into the lungs every 6 (six) hours as needed for wheezing or shortness of breath. (Patient not taking: Reported on 01/06/2018), Disp: 1 Inhaler, Rfl: 5 .  aspirin-acetaminophen-caffeine (EXCEDRIN MIGRAINE) 250-250-65 MG tablet, Take 2 tablets by mouth daily as needed for headache (PAIN)., Disp: , Rfl:  .  atorvastatin (LIPITOR) 40 MG tablet, Take 1 tablet (40 mg total) by mouth daily. (Patient not taking: Reported on 06/02/2017), Disp: 30 tablet, Rfl: 11 .  clopidogrel (PLAVIX)  75 MG tablet, Take 1 tablet (75 mg total) by mouth daily. (Patient not taking: Reported on 01/06/2018), Disp: 30 tablet, Rfl: 11 .  ferrous sulfate 325 (65 FE) MG tablet, Take 1 tablet (325 mg total) by mouth 3 (three) times daily with meals. (Patient not taking: Reported on 01/06/2018), Disp: 90 tablet, Rfl: 5 .  fluticasone (FLONASE) 50 MCG/ACT nasal spray, Place 2 sprays into both nostrils daily. (Patient not taking: Reported on 01/06/2018), Disp: 16 g, Rfl: 2 .  gabapentin (NEURONTIN) 100 MG capsule, Take 1 capsule (100 mg total) by mouth 3 (three) times daily. (Patient not taking: Reported on 01/06/2018), Disp: 90 capsule, Rfl: 5 .  ibuprofen (ADVIL,MOTRIN) 200 MG tablet, Take 400 mg by mouth every 6 (six) hours as needed for mild pain., Disp: , Rfl:        Review of Systems  Constitutional: Negative for chills, fatigue, fever and unexpected weight change.  HENT: Negative for congestion, ear pain, sinus pressure, sinus pain and sore throat.   Eyes: Negative for discharge and visual disturbance.  Respiratory: Negative for cough, shortness of breath and wheezing.   Cardiovascular: Negative for chest pain and leg swelling.  Gastrointestinal: Negative for abdominal pain, blood in stool, constipation, diarrhea, nausea and vomiting.  Genitourinary: Negative for difficulty urinating and hematuria.  Skin: Negative for color change.  Neurological: Negative for dizziness, weakness, light-headedness and headaches.  Hematological: Negative for adenopathy.  All other systems reviewed and are negative.      Objective:   Physical Exam Vitals reviewed.  Constitutional:      General: She is not in acute distress.    Appearance: Normal appearance. She is well-developed.  HENT:     Head: Normocephalic and atraumatic.  Eyes:     General:        Right eye: No discharge.        Left eye: No discharge.  Cardiovascular:     Rate and Rhythm: Normal rate and regular rhythm.     Heart sounds: Normal heart  sounds.  Pulmonary:     Effort: Pulmonary effort is normal. No respiratory distress.     Breath sounds: Normal breath sounds.  Musculoskeletal:     Cervical back: Neck supple.  Skin:    General: Skin is warm and dry.  Neurological:     Mental Status: She is alert and oriented to person, place, and time.  Psychiatric:        Mood and Affect: Mood normal.        Behavior: Behavior normal.    Today's Vitals   02/07/20 0902  BP: 122/78  Pulse: 84  SpO2: 97%  Weight: 185 lb (83.9 kg)  Height: 5' 3.5" (1.613 m)   Body mass index is 32.26 kg/m.     Assessment:     Participant in health and wellness plan      Plan:     1. Strongly encouraged patient to schedule appt with PCP for annual physical and to get scheduled for screening tests - colonoscopy, mammogram, and pap smear. Also discussed the importance of taking her medications as prescribed to lower her risk of heart attack and stroke.  2. Encouraged continued efforts at weight loss through healthy eating and increasing physical activity. 3. Discussed importance of increasing her water intake to help with her muscle cramps. Also recommend taking a multi-vitamin daily. She is unable to take iron supplement d/t se of constipation. Information given on iron-rich diet.  4. Basic lab work will be drawn next week when she is fasting. Will check lipid panel, cbc, cmp.  5. F/u here prn.

## 2020-02-12 ENCOUNTER — Other Ambulatory Visit: Payer: Self-pay | Admitting: Family Medicine

## 2020-02-13 LAB — COMPLETE METABOLIC PANEL WITH GFR
AG Ratio: 1.5 (calc) (ref 1.0–2.5)
ALT: 9 U/L (ref 6–29)
AST: 12 U/L (ref 10–35)
Albumin: 4.3 g/dL (ref 3.6–5.1)
Alkaline phosphatase (APISO): 60 U/L (ref 37–153)
BUN: 17 mg/dL (ref 7–25)
CO2: 22 mmol/L (ref 20–32)
Calcium: 9.2 mg/dL (ref 8.6–10.4)
Chloride: 106 mmol/L (ref 98–110)
Creat: 0.87 mg/dL (ref 0.50–1.05)
GFR, Est African American: 90 mL/min/{1.73_m2} (ref >=60)
GFR, Est Non African American: 78 mL/min/{1.73_m2} (ref >=60)
Globulin: 2.8 g/dL (calc) (ref 1.9–3.7)
Glucose, Bld: 110 mg/dL — ABNORMAL HIGH (ref 65–99)
Potassium: 3.9 mmol/L (ref 3.5–5.3)
Sodium: 140 mmol/L (ref 135–146)
Total Bilirubin: 0.2 mg/dL (ref 0.2–1.2)
Total Protein: 7.1 g/dL (ref 6.1–8.1)

## 2020-02-13 LAB — LIPID PANEL
Cholesterol: 212 mg/dL — ABNORMAL HIGH (ref <200)
HDL: 69 mg/dL (ref >=50)
LDL Cholesterol (Calc): 121 mg/dL (calc) — ABNORMAL HIGH
Non-HDL Cholesterol (Calc): 143 mg/dL (calc) — ABNORMAL HIGH (ref <130)
Total CHOL/HDL Ratio: 3.1 (calc) (ref <5.0)
Triglycerides: 111 mg/dL (ref <150)

## 2020-02-13 LAB — CBC
HCT: 27.9 % — ABNORMAL LOW (ref 35.0–45.0)
Hemoglobin: 7 g/dL — ABNORMAL LOW (ref 11.7–15.5)
MCH: 15.4 pg — ABNORMAL LOW (ref 27.0–33.0)
MCHC: 25.1 g/dL — ABNORMAL LOW (ref 32.0–36.0)
MCV: 61.2 fL — ABNORMAL LOW (ref 80.0–100.0)
MPV: 9.1 fL (ref 7.5–12.5)
Platelets: 317 10*3/uL (ref 140–400)
RBC: 4.56 10*6/uL (ref 3.80–5.10)
RDW: 21.6 % — ABNORMAL HIGH (ref 11.0–15.0)
WBC: 7.8 10*3/uL (ref 3.8–10.8)

## 2020-02-13 LAB — HEMOGLOBIN A1C W/OUT EAG: Hgb A1c MFr Bld: 5.4 % of total Hgb (ref <5.7)

## 2020-02-14 ENCOUNTER — Telehealth: Payer: Self-pay | Admitting: Family Medicine

## 2020-02-14 ENCOUNTER — Encounter (HOSPITAL_COMMUNITY): Payer: Self-pay

## 2020-02-14 ENCOUNTER — Other Ambulatory Visit: Payer: Self-pay

## 2020-02-14 DIAGNOSIS — Z8673 Personal history of transient ischemic attack (TIA), and cerebral infarction without residual deficits: Secondary | ICD-10-CM | POA: Diagnosis not present

## 2020-02-14 DIAGNOSIS — J449 Chronic obstructive pulmonary disease, unspecified: Secondary | ICD-10-CM | POA: Insufficient documentation

## 2020-02-14 DIAGNOSIS — R799 Abnormal finding of blood chemistry, unspecified: Secondary | ICD-10-CM | POA: Diagnosis present

## 2020-02-14 DIAGNOSIS — Z87891 Personal history of nicotine dependence: Secondary | ICD-10-CM | POA: Insufficient documentation

## 2020-02-14 DIAGNOSIS — D509 Iron deficiency anemia, unspecified: Secondary | ICD-10-CM | POA: Insufficient documentation

## 2020-02-14 DIAGNOSIS — R5383 Other fatigue: Secondary | ICD-10-CM | POA: Diagnosis not present

## 2020-02-14 DIAGNOSIS — Z91040 Latex allergy status: Secondary | ICD-10-CM | POA: Insufficient documentation

## 2020-02-14 LAB — COMPREHENSIVE METABOLIC PANEL
ALT: 12 U/L (ref 0–44)
AST: 15 U/L (ref 15–41)
Albumin: 4.3 g/dL (ref 3.5–5.0)
Alkaline Phosphatase: 60 U/L (ref 38–126)
Anion gap: 8 (ref 5–15)
BUN: 15 mg/dL (ref 6–20)
CO2: 24 mmol/L (ref 22–32)
Calcium: 8.8 mg/dL — ABNORMAL LOW (ref 8.9–10.3)
Chloride: 108 mmol/L (ref 98–111)
Creatinine, Ser: 0.75 mg/dL (ref 0.44–1.00)
GFR calc Af Amer: >60 mL/min (ref >60)
GFR calc non Af Amer: >60 mL/min (ref >60)
Glucose, Bld: 138 mg/dL — ABNORMAL HIGH (ref 70–99)
Potassium: 3.5 mmol/L (ref 3.5–5.1)
Sodium: 140 mmol/L (ref 135–145)
Total Bilirubin: 0.2 mg/dL — ABNORMAL LOW (ref 0.3–1.2)
Total Protein: 8 g/dL (ref 6.5–8.1)

## 2020-02-14 LAB — CBC
HCT: 29.7 % — ABNORMAL LOW (ref 36.0–46.0)
Hemoglobin: 7.8 g/dL — ABNORMAL LOW (ref 12.0–15.0)
MCH: 16.1 pg — ABNORMAL LOW (ref 26.0–34.0)
MCHC: 26.3 g/dL — ABNORMAL LOW (ref 30.0–36.0)
MCV: 61.4 fL — ABNORMAL LOW (ref 80.0–100.0)
Platelets: 259 10*3/uL (ref 150–400)
RBC: 4.84 MIL/uL (ref 3.87–5.11)
RDW: 23 % — ABNORMAL HIGH (ref 11.5–15.5)
WBC: 5.8 10*3/uL (ref 4.0–10.5)
nRBC: 0 % (ref 0.0–0.2)

## 2020-02-14 NOTE — Telephone Encounter (Signed)
Left another voicemail for patient on her cell to please call back regarding her lab results. Awaiting return call.

## 2020-02-14 NOTE — Progress Notes (Signed)
Pt returned call. Discussed lab results with her. Kidney and liver function, A1c all normal. Her cholesterol is elevated but not at a point where we would need to start medication, work on heart healthy diet and exercise.   Her hgb is significantly low at 7.0. She denies any known source of bleeding. She does have a hx of IDA. Recommend she go to the ED for further evaluation, to rule out any potential source of bleeding and treatment at this time. Also encouraged her to get back in with her PCP for f/u. She verbalized understanding.

## 2020-02-14 NOTE — ED Triage Notes (Signed)
Obtained labs from work. Called pt back stating low hgb at 7.0. Denies any bleeding.

## 2020-02-14 NOTE — Telephone Encounter (Signed)
Called patient and left voicemail for her to call back to discuss her lab results and recommended treatment options.  Awaiting return call.

## 2020-02-15 ENCOUNTER — Emergency Department (HOSPITAL_COMMUNITY)
Admission: EM | Admit: 2020-02-15 | Discharge: 2020-02-15 | Disposition: A | Discharge status: Home / Self Care | Payer: PRIVATE HEALTH INSURANCE | Attending: Emergency Medicine | Admitting: Emergency Medicine

## 2020-02-15 DIAGNOSIS — D509 Iron deficiency anemia, unspecified: Secondary | ICD-10-CM

## 2020-02-15 NOTE — ED Notes (Signed)
PT ambulatory from triage without assistance.

## 2020-02-15 NOTE — ED Provider Notes (Signed)
[facility] DEPT Provider Note  CSN: [id number] Arrival date & time: 02/14/20 1926  Chief Complaint(s) Abnormal Lab  HPI [name] is a 49 y.o. female with a past medical history of abnormal uterine bleeding, iron deficiency anemia who has been off of her iron for several years presents to the emergency department for low hemoglobin noted on labs drawn during physical.  Patient endorses some mild fatigue but denies any shortness of breath, exertional chest pain.  No abdominal pain.  She denies any bloody bowel movements.  No other physical complaints.  HPI  Past Medical History Past Medical History:  Diagnosis Date  . Anemia   . GERD (gastroesophageal reflux disease)   . Headache(784.0)   . History of blood transfusion    Hx; of in 1991 after delivery  . History of TIAs   . Numbness    Right hand  . Pneumonia   . Stroke [facility]) 05/2013   Patient Active Problem List   Diagnosis Date Noted  . Morning headache 06/02/2017  . Leiomyoma of body of uterus 12/24/2016  . Acute bronchitis 11/11/2016  . Prediabetes 11/11/2016  . Pap smear abnormality of cervix with ASCUS favoring benign 01/01/2016  . COPD (chronic obstructive pulmonary disease) ([place]) 11/18/2015  . GERD (gastroesophageal reflux disease) 11/18/2015  . Abnormal imaging of thyroid 11/04/2015  . Elevated d-dimer 11/01/2015  . IDA (iron deficiency anemia) 09/04/2015  . 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 S/P cervical spinal fusion 03/06/2014  . Obesity 02/22/2014  . Radicular pain in right arm 01/11/2014  . 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 Other and unspecified hyperlipidemia 08/24/2013  . History of CVA (cerebrovascular accident) 05/18/2013  . 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 Authorizing Provider  aspirin-acetaminophen-caffeine (EXCEDRIN MIGRAINE) [phone] MG  tablet Take 1 tablet by mouth every 6 (six) hours as needed for headache or migraine.   Yes [provider]  albuterol (PROVENTIL HFA;VENTOLIN HFA) 108 (90 Base) MCG/ACT inhaler Inhale 2 puffs into the lungs every 6 (six) hours as needed for wheezing or shortness of breath. Patient not taking: Reported on 01/06/2018 06/02/17   Boykin Nearing, MD  atorvastatin (LIPITOR) 40 MG tablet Take 1 tablet (40 mg total) by mouth daily. Patient not taking: Reported on 06/02/2017 11/11/16   Boykin Nearing, MD  clopidogrel (PLAVIX) 75 MG tablet Take 1 tablet (75 mg total) by mouth daily. Patient not taking: Reported on 01/06/2018 11/11/16   Boykin Nearing, MD  ferrous sulfate 325 (65 FE) MG tablet Take 1 tablet (325 mg total) by mouth 3 (three) times daily with meals. Patient not taking: Reported on 01/06/2018 09/10/15   Boykin Nearing, MD  fluticasone (FLONASE) 50 MCG/ACT nasal spray Place 2 sprays into both nostrils daily. Patient not taking: Reported on 01/06/2018 07/16/16   Boykin Nearing, MD  gabapentin (NEURONTIN) 100 MG capsule Take 1 capsule (100 mg total) by mouth 3 (three) times daily. Patient not taking: Reported on 01/06/2018 11/11/16   Boykin Nearing, MD  Past Surgical History Past Surgical History:  Procedure Laterality Date  . ANTERIOR CERVICAL DECOMP/DISCECTOMY FUSION N/A 03/06/2014   Procedure: ANTERIOR CERVICAL DECOMPRESSION/DISCECTOMY FUSION 2 LEVELS four/five, five/six;  Surgeon: Eustace Moore, MD;  Location: [facility] NEURO ORS;  Service: Neurosurgery;  Laterality: N/A;  . CESAREAN SECTION     x 1 with 5th pregnancy  . CHOLECYSTECTOMY    . TEE WITHOUT CARDIOVERSION N/A 05/21/2013   Procedure: TRANSESOPHAGEAL ECHOCARDIOGRAM (TEE);  Surgeon: Sueanne Margarita, MD;  Location: [facility] ENDOSCOPY;  Service: Cardiovascular;  Laterality: N/A;  . TUBAL LIGATION     Family  History Family History  Problem Relation Age of Onset  . Renal Disease Father        dialysis  . Hypertension Father   . Heart disease Mother   . Heart disease Maternal Grandfather   . Asthma Sister   . Asthma Maternal Aunt     Social History Social History   Tobacco Use  . Smoking status: Former Smoker    Packs/day: 0.00    Years: 26.00    Pack years: 0.00    Types: Cigarettes    Quit date: 10/09/2015    Years since quitting: 4.3  . Smokeless tobacco: Former Network engineer Use Topics  . Alcohol use: Yes    Alcohol/week: 0.0 standard drinks    Comment: occ  . Drug use: No   Allergies Shellfish allergy and Latex  Review of Systems Review of Systems All other systems are reviewed and are negative for acute change except as noted in the HPI  Physical Exam Vital Signs  I have reviewed the triage vital signs BP 123/73   Pulse 82   Temp 99 F (37.2 C) (Oral)   Resp 16   Ht 5\' 3"  (1.6 m)   Wt 83.9 kg   SpO2 100%   BMI 32.77 kg/m   Physical Exam Vitals reviewed.  Constitutional:      General: She is not in acute distress.    Appearance: She is well-developed. She is not diaphoretic.  HENT:     Head: Normocephalic and atraumatic.     Right Ear: External ear normal.     Left Ear: External ear normal.     Nose: Nose normal.  Eyes:     General: No scleral icterus.    Conjunctiva/sclera: Conjunctivae normal.  Neck:     Trachea: Phonation normal.  Cardiovascular:     Rate and Rhythm: Normal rate and regular rhythm.  Pulmonary:     Effort: Pulmonary effort is normal. No respiratory distress.     Breath sounds: No stridor.  Abdominal:     General: There is no distension.     Tenderness: There is no abdominal tenderness.  Musculoskeletal:        General: Normal range of motion.     Cervical back: Normal range of motion.  Neurological:     Mental Status: She is alert and oriented to person, place, and time.  Psychiatric:        Behavior: Behavior normal.      ED Results and Treatments Labs (all labs ordered are listed, but only abnormal results are displayed) Labs Reviewed  CBC - Abnormal; Notable for the following components:      Result Value   Hemoglobin 7.8 (*)    HCT 29.7 (*)    MCV 61.4 (*)    MCH 16.1 (*)    MCHC 26.3 (*)    RDW 23.0 (*)    All  other components within normal limits  COMPREHENSIVE METABOLIC PANEL - Abnormal; Notable for the following components:   Glucose, Bld 138 (*)    Calcium 8.8 (*)    Total Bilirubin 0.2 (*)    All other components within normal limits                                                                                                                         EKG  EKG Interpretation  Date/Time:    Ventricular Rate:    PR Interval:    QRS Duration:   QT Interval:    QTC Calculation:   R Axis:     Text Interpretation:        Radiology No results found.  Pertinent labs & imaging results that were available during my care of the patient were reviewed by me and considered in my medical decision making (see chart for details).  Medications Ordered in ED Medications - No data to display                                                                                                                                  Procedures Procedures  (including critical care time)  Medical Decision Making / ED Course I have reviewed the nursing notes for this encounter and the patient's prior records (if available in EHR or on provided paperwork).   ALESHKA MARCZAK was evaluated in Emergency Department on 02/15/2020 for the symptoms described in the history of present illness. She was evaluated in the context of the global COVID-19 pandemic, which necessitated consideration that the patient might be at risk for infection with the SARS-CoV-2 virus that causes COVID-19. Institutional protocols and algorithms that pertain to the evaluation of patients at risk for COVID-19 are in a state of rapid change  based on information released by regulatory bodies including the CDC and federal and state organizations. These policies and algorithms were followed during the patient's care in the ED.  Repeat hemoglobin improved now 7.8.  It is microcytic and likely consistent with iron deficiency anemia as well as her [place] menorrhagia.  Patient is not significantly symptomatic from this anemia.  Offered blood transfusion in the emergency department versus starting back on her iron supplementation and follow-up with her primary care provider.  Patient chose the latter.      Final Clinical Impression(s) / ED Diagnoses Final diagnoses:  Microcytic anemia  The patient appears reasonably screened and/or stabilized for discharge and I doubt any other medical condition or other [facility] requiring further screening, evaluation, or treatment in the ED at this time prior to discharge. Safe for discharge with strict return precautions.  Disposition: Discharge  Condition: Good  I have discussed the results, Dx and Tx plan with the patient/family who expressed understanding and agree(s) with the plan. Discharge instructions discussed at length. The patient/family was given strict return precautions who verbalized understanding of the instructions. No further questions at time of discharge.    ED Discharge Orders    None       Follow Up: Boykin Nearing, MD [address] [place] [address] [phone]  Schedule an appointment as soon as possible for a visit        This chart was dictated using voice recognition software.  Despite best efforts to proofread,  errors can occur which can change the documentation meaning.   Fatima Blank, MD 02/15/20 [phone]

## 2020-02-15 NOTE — ED Notes (Signed)
Pt a/o and ambulatory at discharge. Pt verbalized discharge instructions and iron supplement usage.

## 2020-08-25 ENCOUNTER — Emergency Department (HOSPITAL_COMMUNITY)
Admission: EM | Admit: 2020-08-25 | Discharge: 2020-08-26 | Disposition: A | Discharge status: Home / Self Care | Payer: PRIVATE HEALTH INSURANCE | Attending: Emergency Medicine | Admitting: Emergency Medicine

## 2020-08-25 ENCOUNTER — Other Ambulatory Visit: Payer: Self-pay

## 2020-08-25 ENCOUNTER — Emergency Department (HOSPITAL_COMMUNITY): Payer: PRIVATE HEALTH INSURANCE

## 2020-08-25 ENCOUNTER — Encounter (HOSPITAL_COMMUNITY): Payer: Self-pay | Admitting: Emergency Medicine

## 2020-08-25 DIAGNOSIS — Z87891 Personal history of nicotine dependence: Secondary | ICD-10-CM | POA: Insufficient documentation

## 2020-08-25 DIAGNOSIS — R519 Headache, unspecified: Secondary | ICD-10-CM | POA: Diagnosis not present

## 2020-08-25 MED ORDER — SODIUM CHLORIDE 0.9 % IV BOLUS
1000.0000 mL | Freq: Once | INTRAVENOUS | Status: AC
  Administered 2020-08-25: 1000 mL via INTRAVENOUS

## 2020-08-25 MED ORDER — PROCHLORPERAZINE EDISYLATE 10 MG/2ML IJ SOLN
10.0000 mg | Freq: Once | INTRAMUSCULAR | Status: AC
  Administered 2020-08-25: 10 mg via INTRAVENOUS
  Filled 2020-08-25: qty 2

## 2020-08-25 MED ORDER — DIPHENHYDRAMINE HCL 50 MG/ML IJ SOLN
25.0000 mg | Freq: Once | INTRAMUSCULAR | Status: AC
  Administered 2020-08-25: 25 mg via INTRAVENOUS
  Filled 2020-08-25: qty 1

## 2020-08-25 NOTE — ED Provider Notes (Signed)
[facility] Emergency Department Provider Note MRN:  [id number]  Arrival date & time: 08/25/20     Chief Complaint   Headache History of Present Illness   [name] is a 49 y.o. year-old female with a history of GERD presenting to the ED with chief complaint of headache.  Patient reports a history of stroke and TIAs and explains that her current symptoms feel similar.  Endorsing gradual onset headache over the past 2 days, today was getting ready for work when at about 9:45 PM she began experiencing strange sensations in her arm.  Her left arm "was not acting right".  Denies any numbness or weakness to the arms or legs, explains that her left arm is getting better but still feels paresthesias in the left fingers.  Denies any nausea or vomiting, no vision change, no voice change, no trouble swallowing, chest pain or shortness of breath, no abdominal pain.  During the symptoms she noticed that her blood pressure was very elevated.  Review of Systems  A complete 10 system review of systems was obtained and all systems are negative except as noted in the HPI and PMH.   Patient's Health History    Past Medical History:  Diagnosis Date  . Anemia   . GERD (gastroesophageal reflux disease)   . Headache(784.0)   . History of blood transfusion    Hx; of in 1991 after delivery  . History of TIAs   . Numbness    Right hand  . Pneumonia   . Stroke [facility]) 05/2013    Past Surgical History:  Procedure Laterality Date  . ANTERIOR CERVICAL DECOMP/DISCECTOMY FUSION N/A 03/06/2014   Procedure: ANTERIOR CERVICAL DECOMPRESSION/DISCECTOMY FUSION 2 LEVELS four/five, five/six;  Surgeon: Eustace Moore, MD;  [facility] NEURO ORS;  Service: Neurosurgery;  Laterality: N/A;  . CESAREAN SECTION     x 1 with 5th pregnancy  . CHOLECYSTECTOMY    . TEE WITHOUT CARDIOVERSION N/A 05/21/2013   Procedure: TRANSESOPHAGEAL ECHOCARDIOGRAM (TEE);  Surgeon: Sueanne Margarita, MD;  Location: [facility]  ENDOSCOPY;  Service: Cardiovascular;  Laterality: N/A;  . TUBAL LIGATION      Family History  Problem Relation Age of Onset  . Renal Disease Father        dialysis  . Hypertension Father   . Heart disease Mother   . Heart disease Maternal Grandfather   . Asthma Sister   . Asthma Maternal Aunt     Social History   Socioeconomic History  . Marital status: Divorced    Spouse name: Not on file  . Number of children: 7  . Years of education: GEd  . Highest education level: Not on file  Occupational History  . Occupation: CNA    Employer: [employer]  Tobacco Use  . Smoking status: Former Smoker    Packs/day: 0.00    Years: 26.00    Pack years: 0.00    Types: Cigarettes    Quit date: 10/09/2015    Years since quitting: 4.8  . Smokeless tobacco: Former Network engineer  . Vaping Use: Never used  Substance and Sexual Activity  . Alcohol use: Yes    Alcohol/week: 0.0 standard drinks    Comment: occ  . Drug use: No  . Sexual activity: Not on file  Other Topics Concern  . Not on file  Social History Narrative   Patient lives at home with her family    She has 7 children   5 grown  and out of the house   2 at home 11 and 14   She works as a Quarry manager at [employer]:   . Difficulty of Paying Living Expenses: Not on file  Food Insecurity:   . Worried About Charity fundraiser in the Last Year: Not on file  . Ran Out of Food in the Last Year: Not on file  Transportation Needs:   . Lack of Transportation (Medical): Not on file  . Lack of Transportation (Non-Medical): Not on file  Physical Activity:   . Days of Exercise per Week: Not on file  . Minutes of Exercise per Session: Not on file  Stress:   . Feeling of Stress : Not on file  Social Connections:   . Frequency of Communication with Friends and Family: Not on file  . Frequency of Social Gatherings with Friends and Family: Not on file  . Attends  Religious Services: Not on file  . Active Member of Clubs or Organizations: Not on file  . Attends Archivist Meetings: Not on file  . Marital Status: Not on file  Intimate Partner Violence:   . Fear of Current or Ex-Partner: Not on file  . Emotionally Abused: Not on file  . Physically Abused: Not on file  . Sexually Abused: Not on file     Physical Exam   Vitals:   08/25/20 2212 08/25/20 2221  BP: (!) 182/119 (!) 153/91  Pulse: 96 77  Resp: 16 19  Temp: 98.8 F (37.1 C)   SpO2: 95% 95%    CONSTITUTIONAL: Well-appearing, NAD NEURO:  Alert and oriented x 3, normal and symmetric strength and sensation, normal coordination, normal speech, no aphasia, no visual field cuts, no neglect EYES:  eyes equal and reactive ENT/NECK:  no LAD, no JVD CARDIO: Regular rate, well-perfused, normal S1 and S2 PULM:  CTAB no wheezing or rhonchi GI/GU:  normal bowel sounds, non-distended, non-tender MSK/SPINE:  No gross deformities, no edema SKIN:  no rash, atraumatic PSYCH:  Appropriate speech and behavior  *Additional and/or pertinent findings included in MDM below  Diagnostic and Interventional Summary    EKG Interpretation  Date/Time:    Ventricular Rate:    PR Interval:    QRS Duration:   QT Interval:    QTC Calculation:   R Axis:     Text Interpretation:        Labs Reviewed - No data to display  CT HEAD WO CONTRAST  Final Result      Medications  diphenhydrAMINE (BENADRYL) injection 25 mg (25 mg Intravenous Given 08/25/20 2254)  prochlorperazine (COMPAZINE) injection 10 mg (10 mg Intravenous Given 08/25/20 2254)  sodium chloride 0.9 % bolus 1,000 mL (1,000 mLs Intravenous New Bag/Given 08/25/20 2253)     Procedures  /  Critical Care Procedures  ED Course and Medical Decision Making  I have reviewed the triage vital signs, the nursing notes, and pertinent available records from the EMR.  Listed above are laboratory and imaging tests that I personally  ordered, reviewed, and interpreted and then considered in my medical decision making (see below for details).  Completely normal neurological exam at this time, hypertensive but otherwise with normal vital signs.  No documented history of TIA or stroke, and this presentation would be inconsistent given the lack of any true neurological deficits.  Favoring complex migraine, given her NIH stroke scale of 0 there is no indication for  code stroke initiation and she is definitely not a candidate for TPA at this time, will monitor closely, obtain screening CT head, provide migraine cocktail and reassess.     Patient continues to have a normal neurological exam, feeling better, CT is normal, appropriate for discharge with strict return precautions.  Barth Kirks. Sedonia Small, [place] [email or web address]  Final Clinical Impressions(s) / ED Diagnoses     ICD-10-CM   1. Acute nonintractable headache, unspecified headache type  R51.9     ED Discharge Orders    None       Discharge Instructions Discussed with and Provided to Patient:     Discharge Instructions     You were evaluated in the Emergency Department and after careful evaluation, we did not find any emergent condition requiring admission or further testing in the hospital.  Your exam/testing today was overall reassuring.  CT does not show any signs of stroke.  Please return to the Emergency Department if you experience any worsening of your condition.  Thank you for allowing [place] to be a part of your care.        Maudie Flakes, MD 08/25/20 2329

## 2020-08-25 NOTE — ED Triage Notes (Signed)
Patient had a previous stroke and thought she was having same symptoms such as tremors and "cloudy" headache tonight. Patient then check ed her BP which was 170/98 and took her BP medication. Tremors have subsided, but her finger tips feel numb.

## 2020-08-25 NOTE — Discharge Instructions (Addendum)
You were evaluated in the Emergency Department and after careful evaluation, we did not find any emergent condition requiring admission or further testing in the hospital.  Your exam/testing today was overall reassuring.  CT does not show any signs of stroke.  Please return to the Emergency Department if you experience any worsening of your condition.  Thank you for allowing [place] to be a part of your care.

## 2020-08-25 NOTE — Patient Instructions (Signed)
Thank you for choosing Primary Care at [facility] to be your medical home!    [name] was seen by Melina Schools, DO today.   [name]'s primary care provider is Phill Myron, DO.   For the best care possible, you should try to see Phill Myron, DO whenever you come to the clinic.   We look forward to seeing you again soon!  If you have any questions about your visit today, please call us at [phone] or feel free to reach your primary care provider via [place].

## 2020-08-26 ENCOUNTER — Telehealth (INDEPENDENT_AMBULATORY_CARE_PROVIDER_SITE_OTHER): Payer: PRIVATE HEALTH INSURANCE | Admitting: Internal Medicine

## 2020-08-26 ENCOUNTER — Encounter: Payer: Self-pay | Admitting: Internal Medicine

## 2020-08-26 ENCOUNTER — Ambulatory Visit (INDEPENDENT_AMBULATORY_CARE_PROVIDER_SITE_OTHER): Payer: PRIVATE HEALTH INSURANCE | Admitting: Licensed Clinical Social Worker

## 2020-08-26 DIAGNOSIS — Z1159 Encounter for screening for other viral diseases: Secondary | ICD-10-CM | POA: Diagnosis not present

## 2020-08-26 DIAGNOSIS — Z7689 Persons encountering health services in other specified circumstances: Secondary | ICD-10-CM

## 2020-08-26 DIAGNOSIS — Z1211 Encounter for screening for malignant neoplasm of colon: Secondary | ICD-10-CM

## 2020-08-26 DIAGNOSIS — G8929 Other chronic pain: Secondary | ICD-10-CM

## 2020-08-26 DIAGNOSIS — Z1231 Encounter for screening mammogram for malignant neoplasm of breast: Secondary | ICD-10-CM

## 2020-08-26 DIAGNOSIS — K219 Gastro-esophageal reflux disease without esophagitis: Secondary | ICD-10-CM

## 2020-08-26 DIAGNOSIS — F411 Generalized anxiety disorder: Secondary | ICD-10-CM

## 2020-08-26 DIAGNOSIS — Z862 Personal history of diseases of the blood and blood-forming organs and certain disorders involving the immune mechanism: Secondary | ICD-10-CM

## 2020-08-26 DIAGNOSIS — Z13228 Encounter for screening for other metabolic disorders: Secondary | ICD-10-CM

## 2020-08-26 DIAGNOSIS — D259 Leiomyoma of uterus, unspecified: Secondary | ICD-10-CM

## 2020-08-26 DIAGNOSIS — M25561 Pain in right knee: Secondary | ICD-10-CM

## 2020-08-26 MED ORDER — SERTRALINE HCL 25 MG PO TABS: ORAL_TABLET | ORAL | 1 refills | Status: DC

## 2020-08-26 NOTE — Progress Notes (Signed)
Virtual Visit via Telephone Note  I connected with [name], on 08/26/2020 at 9:02 AM by telephone due to the COVID-19 pandemic and verified that I am speaking with the correct person using two identifiers.   Consent: I discussed the limitations, risks, security and privacy concerns of performing an evaluation and management service by telephone and the availability of in person appointments. I also discussed with the patient that there may be a patient responsible charge related to this service. The patient expressed understanding and agreed to proceed.   Location of Patient: Home   Location of Provider: Clinic    Persons participating in Telemedicine visit: Aneth Schlagel [facility] Dr. Juleen China    History of Present Illness: Patient has a visit for follow up.   Wants a second opinion for right knee pain. Was going to [facility] and they said she needed a total knee replacement. However, was told she wasn't a great candidate due to age. Reports she has tried steroid injections, gel injections, knee arthroscopy with wash out. Reports she is in significant pain and has bone on bone arthritis. Still has to work as Psychologist, counselling. Wants a second opinion.   Has a history of anemia. Has been without iron supplement for about a month. Reports sometimes super tired and sometimes not. Otherwise, asymptomatic.    Has concerns about anxiety. Feels like she can't turn her thoughts off. Her brain goes in all different directions and she can't fall asleep during day (works third shift) because of how her brain is always thinking.     Past Medical History:  Diagnosis Date  . Anemia   . GERD (gastroesophageal reflux disease)   . Headache(784.0)   . History of blood transfusion    Hx; of in 1991 after delivery  . History of TIAs   . Numbness    Right hand  . Pneumonia   . Stroke [facility]) 05/2013   Allergies  Allergen Reactions  . Shellfish Allergy Itching and Swelling   . Latex Itching and Rash    Current Outpatient Medications on File Prior to Visit  Medication Sig Dispense Refill  . aspirin-acetaminophen-caffeine (EXCEDRIN MIGRAINE) 250-250-65 MG tablet Take 1 tablet by mouth every 6 (six) hours as needed for headache or migraine.    Marland Kitchen omeprazole (PRILOSEC) 20 MG capsule Take 20 mg by mouth daily.     No current facility-administered medications on file prior to visit.    Observations/Objective: NAD. Speaking clearly.  Work of breathing normal.  Alert and oriented. Mood appropriate.   Assessment and Plan: 1. Encounter to establish care Reviewed patient's PMH, social history, surgical history, and medications.   2. Gastroesophageal reflux disease, unspecified whether esophagitis present Well controlled with PPI.   3. History of anemia HgB 7.8 in April 2021. Discontinued Fe supplements about one month ago. Given menorrhagia with h/o fibroid uterus, suspect will need to resume. Monitor CBC.  - CBC with Differential; Future  4. Need for hepatitis C screening test - HCV Ab w/Rflx to Verification; Future  5. Breast cancer screening by mammogram - MM Digital Screening; Future  6. Colon cancer screening - Ambulatory referral to Gastroenterology  7. Screening for metabolic disorder - Comprehensive metabolic panel; Future  8. Chronic pain of right knee Second opinion requested.  - Ambulatory referral to Orthopedic Surgery  9. Generalized anxiety disorder Patient experiencing increased anxiety. Has been diagnosed with GAD in the past. Start Zoloft. Discussed how to titrate medication, potential side effects, and anticipated  timing of improvement in symptoms. Appointment made this afternoon with Christa See, LCSW. Return to see PCP in 6 weeks for f/u.  - sertraline (ZOLOFT) 25 MG tablet; Take one tablet daily. If tolerating, increase to two tablets in one week.  Dispense: 60 tablet; Refill: 1  10. Uterine leiomyoma, unspecified  location Reports history of menorrhagia with fibroid uterus. Requests referral to OBGYN. Discussed she is due for PAP---will plan to have done there.  - Ambulatory referral to Obstetrics / Gynecology   Follow Up Instructions: Lab visit 10/25; 6 week f/u    I discussed the assessment and treatment plan with the patient. The patient was provided an opportunity to ask questions and all were answered. The patient agreed with the plan and demonstrated an understanding of the instructions.   The patient was advised to call back or seek an in-person evaluation if the symptoms worsen or if the condition fails to improve as anticipated.     I provided 30 minutes total of non-face-to-face time during this encounter including median intraservice time, reviewing previous notes, investigations, ordering medications, medical decision making, coordinating care and patient verbalized understanding at the end of the visit.    Phill Myron, D.O. Primary Care at [facility]  08/26/2020, 9:02 AM

## 2020-09-01 ENCOUNTER — Telehealth: Payer: Self-pay | Admitting: Internal Medicine

## 2020-09-01 ENCOUNTER — Other Ambulatory Visit (INDEPENDENT_AMBULATORY_CARE_PROVIDER_SITE_OTHER): Payer: PRIVATE HEALTH INSURANCE

## 2020-09-01 DIAGNOSIS — Z862 Personal history of diseases of the blood and blood-forming organs and certain disorders involving the immune mechanism: Secondary | ICD-10-CM

## 2020-09-01 DIAGNOSIS — Z13228 Encounter for screening for other metabolic disorders: Secondary | ICD-10-CM

## 2020-09-01 DIAGNOSIS — Z1159 Encounter for screening for other viral diseases: Secondary | ICD-10-CM

## 2020-09-01 NOTE — Telephone Encounter (Signed)
Sent patient a MyChart message to get more information.

## 2020-09-01 NOTE — Telephone Encounter (Signed)
Patient came in for labs today and states that the sertraline (ZOLOFT) 25 MG tablet is not helping her. Please f/u with patient.

## 2020-09-02 LAB — CBC WITH DIFFERENTIAL/PLATELET
Basophils Absolute: 0.1 10*3/uL (ref 0.0–0.2)
Basos: 1 %
EOS (ABSOLUTE): 0.1 10*3/uL (ref 0.0–0.4)
Eos: 2 %
Hematocrit: 27.5 % — ABNORMAL LOW (ref 34.0–46.6)
Hemoglobin: 7.2 g/dL — ABNORMAL LOW (ref 11.1–15.9)
Immature Grans (Abs): 0 10*3/uL (ref 0.0–0.1)
Immature Granulocytes: 0 %
Lymphocytes Absolute: 3.3 10*3/uL — ABNORMAL HIGH (ref 0.7–3.1)
Lymphs: 65 %
MCH: 16.3 pg — ABNORMAL LOW (ref 26.6–33.0)
MCHC: 26.2 g/dL — ABNORMAL LOW (ref 31.5–35.7)
MCV: 62 fL — ABNORMAL LOW (ref 79–97)
Monocytes Absolute: 0.4 10*3/uL (ref 0.1–0.9)
Monocytes: 7 %
Neutrophils Absolute: 1.3 10*3/uL — ABNORMAL LOW (ref 1.4–7.0)
Neutrophils: 25 %
Platelets: 394 10*3/uL (ref 150–450)
RBC: 4.43 x10E6/uL (ref 3.77–5.28)
RDW: 22.2 % — ABNORMAL HIGH (ref 11.7–15.4)
WBC: 5.1 10*3/uL (ref 3.4–10.8)

## 2020-09-02 LAB — COMPREHENSIVE METABOLIC PANEL
ALT: 9 IU/L (ref 0–32)
AST: 13 IU/L (ref 0–40)
Albumin/Globulin Ratio: 1.6 (ref 1.2–2.2)
Albumin: 4.5 g/dL (ref 3.8–4.9)
Alkaline Phosphatase: 69 IU/L (ref 44–121)
BUN/Creatinine Ratio: 13 (ref 9–23)
BUN: 11 mg/dL (ref 6–24)
Bilirubin Total: <0.2 mg/dL (ref 0.0–1.2)
CO2: 20 mmol/L (ref 20–29)
Calcium: 8.7 mg/dL (ref 8.7–10.2)
Chloride: 110 mmol/L — ABNORMAL HIGH (ref 96–106)
Creatinine, Ser: 0.83 mg/dL (ref 0.57–1.00)
GFR calc Af Amer: 94 mL/min/{1.73_m2} (ref >59)
GFR calc non Af Amer: 82 mL/min/{1.73_m2} (ref >59)
Globulin, Total: 2.8 g/dL (ref 1.5–4.5)
Glucose: 98 mg/dL (ref 65–99)
Potassium: 3.6 mmol/L (ref 3.5–5.2)
Sodium: 146 mmol/L — ABNORMAL HIGH (ref 134–144)
Total Protein: 7.3 g/dL (ref 6.0–8.5)

## 2020-09-02 LAB — HCV INTERPRETATION

## 2020-09-02 LAB — HCV AB W/RFLX TO VERIFICATION: HCV Ab: <0.1 s/co ratio (ref 0.0–0.9)

## 2020-09-03 NOTE — Telephone Encounter (Signed)
Patient states that the medication made her nauseous, gave her headaches & caused her issues sleeping so she stopped taking it.  Please put her on the schedule tomorrow as a phone visit.

## 2020-09-04 ENCOUNTER — Ambulatory Visit (INDEPENDENT_AMBULATORY_CARE_PROVIDER_SITE_OTHER): Payer: PRIVATE HEALTH INSURANCE

## 2020-09-04 ENCOUNTER — Other Ambulatory Visit: Payer: Self-pay

## 2020-09-04 ENCOUNTER — Telehealth (INDEPENDENT_AMBULATORY_CARE_PROVIDER_SITE_OTHER): Payer: PRIVATE HEALTH INSURANCE | Admitting: Physician Assistant

## 2020-09-04 ENCOUNTER — Ambulatory Visit (INDEPENDENT_AMBULATORY_CARE_PROVIDER_SITE_OTHER): Payer: PRIVATE HEALTH INSURANCE | Admitting: Orthopedic Surgery

## 2020-09-04 DIAGNOSIS — G4440 Drug-induced headache, not elsewhere classified, not intractable: Secondary | ICD-10-CM

## 2020-09-04 DIAGNOSIS — F411 Generalized anxiety disorder: Secondary | ICD-10-CM | POA: Diagnosis not present

## 2020-09-04 DIAGNOSIS — M79604 Pain in right leg: Secondary | ICD-10-CM

## 2020-09-04 DIAGNOSIS — T887XXA Unspecified adverse effect of drug or medicament, initial encounter: Secondary | ICD-10-CM | POA: Diagnosis not present

## 2020-09-04 DIAGNOSIS — D509 Iron deficiency anemia, unspecified: Secondary | ICD-10-CM

## 2020-09-04 DIAGNOSIS — T3995XA Adverse effect of unspecified nonopioid analgesic, antipyretic and antirheumatic, initial encounter: Secondary | ICD-10-CM

## 2020-09-04 MED ORDER — TRAMADOL HCL 50 MG PO TABS
50.0000 mg | ORAL_TABLET | Freq: Two times a day (BID) | ORAL | 0 refills | Status: DC | PRN
Start: 2020-09-04 — End: 2020-11-27

## 2020-09-04 NOTE — Progress Notes (Signed)
Patient ID: [name], female   DOB: 1971-08-17, 49 y.o.   MRN: [id number] Virtual Visit via Telephone Note  I connected with [name] on 09/04/20 at 10:30 AM EDT by telephone and verified that I am speaking with the correct person using two identifiers.  Location: Patient: [name] Provider: Freeman Caldron, PA-C   I discussed the limitations, risks, security and privacy concerns of performing an evaluation and management service by telephone and the availability of in person appointments. I also discussed with the patient that there may be a patient responsible charge related to this service. The patient expressed understanding and agreed to proceed.   PATIENT visit by telephone virtually in the context of Covid-19 pandemic. Patient location:  home My Location:  [place] office Persons on the call:  Me and the patient.     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  She only took the medication twice then stopped.    Her labs revealed anemia and she is getting otc IRON TO TAKE DAILY.    She also talked about daily HA for over a year now that she takes excedrin for.  No vision changes related to the HA.      Observations/Objective:  NAD.  A&O.  TP linear.  J/I good.    Assessment and Plan: 1. Side effect of medication Discussed serotonin syndrome at length.  Also gave her the option of trying again at an even lower doe which she is willing to try.  I explained that these SE usu go away if they are mild in nature.    2. Generalized anxiety disorder Retry starting zoloft-12.5mg  daily for one week, then 1 tablet daily for one week(if tolerating)-then up to 2 tabs daily by the 3rd or 4th week if tolerating ok.  3. Analgesic rebound headache Advised her to read up on this condition as it is likely the caffeine in the excedrin that is causing her daily HA at the  same time.  In the future, consider tritration off of excedrin, but I have advised her not to do this in the next few weeks while restarting zoloft so as to avoid SE confusion  4. Iron deficiency anemia, unspecified iron deficiency anemia type Take OTC iron bid x 1 week then daily.  Drink 80 ounces water dailly   Follow Up Instructions: televisit with Sr Juleen China in 2 weeks to see if she is tolerating titration of zoloft   I discussed the assessment and treatment plan with the patient. The patient was provided an opportunity to ask questions and all were answered. The patient agreed with the plan and demonstrated an understanding of the instructions.   The patient was advised to call back or seek an in-person evaluation if the symptoms worsen or if the condition fails to improve as anticipated.  I provided 22 minutes of non-face-to-face time during this encounter.   Freeman Caldron, PA-C

## 2020-09-04 NOTE — Progress Notes (Signed)
Patient notified of results & recommendations. Expressed understanding. States that she was taking an iron supplement but has ran out. Plans on restarting.

## 2020-09-06 ENCOUNTER — Encounter: Payer: Self-pay | Admitting: Orthopedic Surgery

## 2020-09-06 NOTE — Progress Notes (Signed)
Office Visit Note   Patient: [name]           Date of Birth: 1971-08-24           MRN: [id number] Visit Date: 09/04/2020 Requested by: Nicolette Bang, DO [place],  [place] [address] PCP: Nicolette Bang, DO  Subjective: Chief Complaint  Patient presents with  . Right Leg - Pain    HPI: [name] is a 49 year old patient here for second opinion regarding her right knee.  She is had pain for about 7 years.  Has had previous arthroscopy in 2015 which did not help much.  She has had gel and cortisone injection both of which did not give her relief.  She describes good and bad days.  Denies any discrete history of injury.  Reports chronic pain.  She works as a Quarry manager.  Takes Tylenol arthritis as well as muscle relaxers for the problem.  She does live on the second floor.  She does have family and friends here in town but she did move from [place] and has most of her support network there.  Denies much in the way of groin pain.  No personal or family history of DVT or pulmonary embolism.  Did have MRI scan in 2019 which only showed L3-4 facet arthritis.  She describes limited walking endurance.  Also reports night pain and rest pain with the right knee.  Very difficult for her to get through her working day with the knee the way it is.              ROS: All systems reviewed are negative as they relate to the chief complaint within the history of present illness.  Patient denies  fevers or chills.   Assessment & Plan: Visit Diagnoses:  1. Pain in right leg     Plan: Impression is end-stage right knee arthritis with global knee pain.  She has failed conservative management.  We talked about operative and nonoperative treatment options for this problem.  In general she is young for total knee replacement however with press-fit prosthesis I think her chance of having an implant with good longevity is relatively high.  The risk of knee replacement including not  limited to knee stiffness incomplete pain relief as well as potential for revision in her lifetime are all discussed.  Her social situation may also be difficult to manage with the fact that she lives on the second floor of an apartment.  Anticipates she would be out of work for about 2 months to 3 months from her work as a Quarry manager.  Ultram prescribed.  Patient does want to proceed with total knee replacement.  We will get that set up for her in the near future.  All questions answered.  Follow-Up Instructions: No follow-ups on file.   Orders:  Orders Placed This Encounter  Procedures  . XR KNEE 3 VIEW RIGHT  . XR Lumbar Spine 2-3 Views   Meds ordered this encounter  Medications  . traMADol (ULTRAM) 50 MG tablet    Sig: Take 1 tablet (50 mg total) by mouth every 12 (twelve) hours as needed.    Dispense:  30 tablet    Refill:  0      Procedures: No procedures performed   Clinical Data: No additional findings.  Objective: Vital Signs: There were no vitals taken for this visit.  Physical Exam:   Constitutional: Patient appears well-developed HEENT:  Head: Normocephalic Eyes:EOM are normal  Neck: Normal range of motion Cardiovascular: Normal rate Pulmonary/chest: Effort normal Neurologic: Patient is alert Skin: Skin is warm Psychiatric: Patient has normal mood and affect    Ortho Exam: Ortho exam demonstrates mild varus alignment right lower extremity with palpable pedal pulses.  No groin pain with internal extra rotation of either leg.  No nerve root tension signs.  Patient has range of motion from 0-1 10 with medial greater than lateral joint line tenderness.  She does report patellofemoral crepitus and has global knee pain.  Ankle dorsiflexion intact.  Pedal pulses palpable 2+ out of 4.  Specialty Comments:  No specialty comments available.  Imaging: No results found.   PMFS History: Patient Active Problem List   Diagnosis Date Noted  . Morning headache 06/02/2017   . Leiomyoma of body of uterus 12/24/2016  . Acute bronchitis 11/11/2016  . Prediabetes 11/11/2016  . Pap smear abnormality of cervix with ASCUS favoring benign 01/01/2016  . COPD (chronic obstructive pulmonary disease) ([place]) 11/18/2015  . GERD (gastroesophageal reflux disease) 11/18/2015  . Abnormal imaging of thyroid 11/04/2015  . Elevated d-dimer 11/01/2015  . IDA (iron deficiency anemia) 09/04/2015  . 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 S/P cervical spinal fusion 03/06/2014  . Obesity 02/22/2014  . Radicular pain in right arm 01/11/2014  . 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 Other and unspecified hyperlipidemia 08/24/2013  . History of CVA (cerebrovascular accident) 05/18/2013  . 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 Anemia   . GERD (gastroesophageal reflux disease)   . Headache(784.0)   . History of blood transfusion    Hx; of in 1991 after delivery  . History of TIAs   . Numbness    Right hand  . Pneumonia   . Stroke [facility]) 05/2013    Family History  Problem Relation Age of Onset  . Renal Disease Father        dialysis  . Hypertension Father   . Heart disease Mother   . Heart disease Maternal Grandfather   . Asthma Sister   . Asthma Maternal Aunt     Past Surgical History:  Procedure Laterality Date  . ANTERIOR CERVICAL DECOMP/DISCECTOMY FUSION N/A 03/06/2014   Procedure: ANTERIOR CERVICAL DECOMPRESSION/DISCECTOMY FUSION 2 LEVELS four/five, five/six;  Surgeon: Eustace Moore, MD;  Location: [facility] NEURO ORS;  Service: Neurosurgery;  Laterality: N/A;  . CESAREAN SECTION     x 1 with 5th pregnancy  . CHOLECYSTECTOMY    . TEE WITHOUT CARDIOVERSION N/A 05/21/2013   Procedure: TRANSESOPHAGEAL ECHOCARDIOGRAM (TEE);  Surgeon: Sueanne Margarita, MD;  Location: [facility] ENDOSCOPY;  Service: Cardiovascular;  Laterality: N/A;  . TUBAL LIGATION     Social History   Occupational History  . Occupation: CNA    Employer: [employer]  Tobacco Use  . Smoking status: Former Smoker    Packs/day: 0.00    Years: 26.00    Pack years: 0.00    Types: Cigarettes    Quit date: 10/09/2015    Years since quitting: 4.9  . Smokeless tobacco: Former Network engineer  . Vaping Use: Never used  Substance and Sexual Activity  . Alcohol use: Yes    Alcohol/week: 0.0 standard drinks    Comment: occ  . Drug use: No  . Sexual activity: Not on file

## 2020-09-15 ENCOUNTER — Ambulatory Visit: Payer: Self-pay | Admitting: Obstetrics and Gynecology

## 2020-09-15 DIAGNOSIS — Z0289 Encounter for other administrative examinations: Secondary | ICD-10-CM

## 2020-09-15 NOTE — BH Specialist Note (Signed)
Integrated Behavioral Health Initial Visit  MRN: [id number] Name: LAJOYA DOMBEK  Number of [place] Clinician visits:: 1/6 Session Start time: 2:45 PM  Session End time: 3:20 PM Total time: 35   Type of Service: [place] Interpretor:No. Interpretor Name and Language: NA   Warm Hand Off Completed.       SUBJECTIVE: [name] is a 49 y.o. female accompanied by self Patient was referred by Dr. Juleen China for anxiety. Patient reports the following symptoms/concerns: Pt reports difficulty managing increase in anxiety symptoms triggered by chronic pain and psychosocial stressors Duration of problem: Ongoing; Severity of problem: moderate  OBJECTIVE: Mood: Anxious and Affect: Appropriate Risk of harm to self or others: No plan to harm self or others  LIFE CONTEXT: Family and Social: Pt receives support from family and friends School/Work:  Self-Care: Pt denies use of illicit drugs. Drinks occasionally Life Changes: Pt reports ongoing stress and chronic pain  GOALS ADDRESSED: Patient will: 1. Reduce symptoms of: anxiety Pt agreed to participate in medication management through PCP 2. Increase knowledge and/or ability of: coping skills Pt agreed to utilize self-care strategies identified in session  INTERVENTIONS: Interventions utilized: Solution-Focused Strategies, Supportive Counseling and Psychoeducation and/or Health Education  Standardized Assessments completed: GAD-7 and PHQ 2&9  ASSESSMENT: Patient currently experiencing difficulty managing increase in anxiety symptoms, including, racing thoughts, irritability, GI issues, and restless sleep due to chronic pain.   Patient may benefit from medication management and therapy. Self-care strategies discussed.  PLAN: 1. Follow up with behavioral health clinician on : 09/16/2020 2. Behavioral recommendations: Utilize strategies discussed and comply with medication  management 3. Referral(s): [place] (In Clinic) 4. "From scale of 1-10, how likely are you to follow plan?":   Rebekah [place]field, LCSW 09/15/2020 1:14 AM

## 2020-09-16 ENCOUNTER — Telehealth (INDEPENDENT_AMBULATORY_CARE_PROVIDER_SITE_OTHER): Payer: PRIVATE HEALTH INSURANCE | Admitting: Internal Medicine

## 2020-09-16 ENCOUNTER — Ambulatory Visit (INDEPENDENT_AMBULATORY_CARE_PROVIDER_SITE_OTHER): Payer: PRIVATE HEALTH INSURANCE | Admitting: Licensed Clinical Social Worker

## 2020-09-16 ENCOUNTER — Encounter: Payer: Self-pay | Admitting: Internal Medicine

## 2020-09-16 DIAGNOSIS — T3995XA Adverse effect of unspecified nonopioid analgesic, antipyretic and antirheumatic, initial encounter: Secondary | ICD-10-CM

## 2020-09-16 DIAGNOSIS — G4440 Drug-induced headache, not elsewhere classified, not intractable: Secondary | ICD-10-CM

## 2020-09-16 DIAGNOSIS — F411 Generalized anxiety disorder: Secondary | ICD-10-CM | POA: Diagnosis not present

## 2020-09-16 NOTE — Progress Notes (Signed)
Virtual Visit via Telephone Note  I connected with [name], on 09/16/2020 at 2:50 PM by telephone due to the COVID-19 pandemic and verified that I am speaking with the correct person using two identifiers.   Consent: I discussed the limitations, risks, security and privacy concerns of performing an evaluation and management service by telephone and the availability of in person appointments. I also discussed with the patient that there may be a patient responsible charge related to this service. The patient expressed understanding and agreed to proceed.   Location of Patient: Home   Location of Provider: Clinic    Persons participating in Telemedicine visit: [name] [facility] Dr. Juleen China      History of Present Illness: Patient has a follow up regarding medication management for GAD. Restarted Zoloft 25 mg on Friday. Still having some mild nausea. Was seen on 10/28 by Freeman Caldron for concerns about Zoloft side effects. She was instructed to take 1/2 tablet for one week. She did fine with that dose. Feeling ok mood wise. Noticed she is sleeping a little bit more.   GAD 7 : Generalized Anxiety Score 09/16/2020 09/04/2020 08/26/2020 06/02/2017  Nervous, Anxious, on Edge 2 2 3  0  Control/stop worrying 2 2 2  0  Worry too much - different things 1 1 3  0  Trouble relaxing 1 2 1  0  Restless 0 1 0 0  Easily annoyed or irritable 1 1 1  0  Afraid - awful might happen 0 1 0 0  Total GAD 7 Score 7 10 10  0      Past Medical History:  Diagnosis Date  . Anemia   . GERD (gastroesophageal reflux disease)   . Headache(784.0)   . History of blood transfusion    Hx; of in 1991 after delivery  . History of TIAs   . Numbness    Right hand  . Pneumonia   . Stroke [facility]) 05/2013   Allergies  Allergen Reactions  . Shellfish Allergy Itching and Swelling  . Latex Itching and Rash    Current Outpatient Medications on File Prior to Visit  Medication Sig Dispense Refill   . aspirin-acetaminophen-caffeine (EXCEDRIN MIGRAINE) 250-250-65 MG tablet Take 1 tablet by mouth every 6 (six) hours as needed for headache or migraine.    Marland Kitchen omeprazole (PRILOSEC) 20 MG capsule Take 20 mg by mouth daily.    . traMADol (ULTRAM) 50 MG tablet Take 1 tablet (50 mg total) by mouth every 12 (twelve) hours as needed. 30 tablet 0   No current facility-administered medications on file prior to visit.    Observations/Objective: NAD. Speaking clearly.  Work of breathing normal.  Alert and oriented. Mood appropriate.   Assessment and Plan: 1. Generalized anxiety disorder GAD-7 slightly improved. Have been slowly titrating Zoloft due to concerns over side effects. Side effects much more stable now. Continue to monitor.   2. Analgesic rebound headache Discussed how to wean off Excedrin due to concern for rebound headache. Encouraged adequate hydration and rest.    Follow Up Instructions: 6 week f/u anxiety    I discussed the assessment and treatment plan with the patient. The patient was provided an opportunity to ask questions and all were answered. The patient agreed with the plan and demonstrated an understanding of the instructions.   The patient was advised to call back or seek an in-person evaluation if the symptoms worsen or if the condition fails to improve as anticipated.     I provided 12  minutes total of non-face-to-face time during this encounter including median intraservice time, reviewing previous notes, investigations, ordering medications, medical decision making, coordinating care and patient verbalized understanding at the end of the visit.    Phill Myron, D.O. Primary Care at [facility]  09/16/2020, 2:50 PM

## 2020-09-18 ENCOUNTER — Ambulatory Visit: Payer: PRIVATE HEALTH INSURANCE | Admitting: Internal Medicine

## 2020-09-27 NOTE — BH Specialist Note (Signed)
Follow up call placed to patient. Patient shared that she has observed a decrease in symptoms with medication management, since last visit. Pt reports that she has been utilizing positive affirmations and has established boundaries with children to assist with stress management. No additional stressors identified.   Follow up appointment scheduled for 10/06/20

## 2020-10-06 ENCOUNTER — Ambulatory Visit: Payer: PRIVATE HEALTH INSURANCE | Attending: Internal Medicine | Admitting: Licensed Clinical Social Worker

## 2020-10-06 DIAGNOSIS — F411 Generalized anxiety disorder: Secondary | ICD-10-CM

## 2020-10-08 ENCOUNTER — Other Ambulatory Visit: Payer: Self-pay

## 2020-10-10 NOTE — BH Specialist Note (Signed)
Follow up call placed to patient. Pt reports that she has continued compliance with medication management noting no current side effects. Pt endorses decrease in anxiety symptoms. LCSW encouraged patient to continue taking medications and utilizing self-care strategies discussed from previous sessions. Pt agreed to think about initiating therapy to continue with management of symptoms. No additional concerns noted.

## 2020-10-13 ENCOUNTER — Encounter: Payer: Self-pay | Admitting: Internal Medicine

## 2020-10-13 ENCOUNTER — Other Ambulatory Visit: Payer: Self-pay

## 2020-10-13 ENCOUNTER — Ambulatory Visit (INDEPENDENT_AMBULATORY_CARE_PROVIDER_SITE_OTHER): Payer: PRIVATE HEALTH INSURANCE | Admitting: Internal Medicine

## 2020-10-13 VITALS — BP 129/86 | HR 100 | Temp 97.3°F | Resp 17 | Wt 180.0 lb

## 2020-10-13 DIAGNOSIS — F411 Generalized anxiety disorder: Secondary | ICD-10-CM

## 2020-10-13 DIAGNOSIS — J441 Chronic obstructive pulmonary disease with (acute) exacerbation: Secondary | ICD-10-CM

## 2020-10-13 DIAGNOSIS — T148XXA Other injury of unspecified body region, initial encounter: Secondary | ICD-10-CM | POA: Diagnosis not present

## 2020-10-13 MED ORDER — AZITHROMYCIN 250 MG PO TABS: ORAL_TABLET | ORAL | 0 refills | Status: DC

## 2020-10-13 MED ORDER — CYCLOBENZAPRINE HCL 10 MG PO TABS: 10.0000 mg | ORAL_TABLET | Freq: Three times a day (TID) | ORAL | 0 refills | Status: DC | PRN

## 2020-10-13 MED ORDER — FLUOXETINE HCL 10 MG PO CAPS: 10.0000 mg | ORAL_CAPSULE | Freq: Every day | ORAL | 1 refills | Status: DC

## 2020-10-13 MED ORDER — PREDNISONE 20 MG PO TABS: 40.0000 mg | ORAL_TABLET | Freq: Every day | ORAL | 0 refills | Status: DC

## 2020-10-13 NOTE — Progress Notes (Signed)
Subjective:    [name] - 49 y.o. female MRN [id number]  Date of birth: Jan 12, 1971  HPI  [name] is here for follow up of anxiety. Patient had a visit with this provider 11/9. Was slowly titrating Zoloft dose at that time due to side effects. Patient had follow up call with Dawn See, LCSW on 11/29 and denied side effects of Zoloft and better control of her anxiety. Now says the Zoloft is not helping and causing side effects. She would like to trial switching medications.   Also worried she is having a COPD exacerbation. Increased cough with sputum. Hears herself wheezing. Reports this seems to happen yearly around this time. Only has a rescue inhaler at home.  Marland Kitchen GAD 7 : Generalized Anxiety Score 10/13/2020 09/16/2020 09/04/2020 08/26/2020  Nervous, Anxious, on Edge 1 2 2 3   Control/stop worrying 1 2 2 2   Worry too much - different things 1 1 1 3   Trouble relaxing 0 1 2 1   Restless 0 0 1 0  Easily annoyed or irritable 1 1 1 1   Afraid - awful might happen 0 0 1 0  Total GAD 7 Score 4 7 10 10     Health Maintenance:  Health Maintenance Due  Topic Date Due  . TETANUS/TDAP  Never done  . MAMMOGRAM  Never done  . COLONOSCOPY  Never done  . COVID-19 Vaccine (3 - Booster for Moderna series) 06/06/2020    -  reports that she quit smoking about 5 years ago. Her smoking use included cigarettes. She smoked 0.00 packs per day for 26.00 years. She has quit using smokeless tobacco. - Review of Systems: Per HPI. - Past Medical History: Patient Active Problem List   Diagnosis Date Noted  . Morning headache 06/02/2017  . Leiomyoma of body of uterus 12/24/2016  . Acute bronchitis 11/11/2016  . Prediabetes 11/11/2016  . Pap smear abnormality of cervix with ASCUS favoring benign 01/01/2016  . COPD (chronic obstructive pulmonary disease) ([place]) 11/18/2015  . GERD (gastroesophageal reflux disease) 11/18/2015  . Abnormal imaging of thyroid 11/04/2015  . Elevated d-dimer 11/01/2015   . IDA (iron deficiency anemia) 09/04/2015  . 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 S/P cervical spinal fusion 03/06/2014  . Obesity 02/22/2014  . Radicular pain in right arm 01/11/2014  . 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 Other and unspecified hyperlipidemia 08/24/2013  . History of CVA (cerebrovascular accident) 05/18/2013  . Tobacco abuse, 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   - Medications: reviewed and updated   Objective:   Physical Exam BP 129/86   Pulse 100   Temp (!) 97.3 F (36.3 C) (Temporal)   Resp 17   Wt 180 lb (81.6 kg)   SpO2 96%   BMI 31.39 kg/m  Physical Exam Constitutional:      General: She is not in acute distress.    Appearance: She is not diaphoretic.  Cardiovascular:     Rate and Rhythm: Normal rate.  Pulmonary:     Effort: Pulmonary effort is normal. No respiratory distress.     Breath sounds: Wheezing present.  Musculoskeletal:        General: Normal range of motion.  Skin:    General: Skin is warm and dry.  Neurological:     Mental Status: She is alert and oriented to person, place, and time.  Psychiatric:        Mood and Affect: Affect normal.        Judgment: Judgment normal.  Assessment & Plan:   1. Generalized anxiety disorder GAD-7 improved but given patient's concerns will switch to Prozac from Zoloft. Was on low dose of Zoloft, so can switch immediately without titration and start low dose Prozac. Discussed may have some side effects when switching between SSRIs. Follow up within 6 weeks.  - FLUoxetine (PROZAC) 10 MG capsule; Take 1 capsule (10 mg total) by mouth daily.  Dispense: 30 capsule; Refill: 1  2. COPD exacerbation ([place]) Will treat as COPD exacerbation given symptoms and exam findings. Pulse Ox is stable. If patient has another COPD exacerbation within the next 1-2 years, would consider starting controller inhaler.  - predniSONE (DELTASONE) 20 MG tablet; Take 2 tablets (40 mg total) by mouth daily with  breakfast.  Dispense: 10 tablet; Refill: 0 - azithromycin (ZITHROMAX Z-PAK) 250 MG tablet; Take two tablets on day one, followed by one tablet daily for 4 days.  Dispense: 6 each; Refill: 0  3. Muscle strain Mild muscle strain of paraspinal lumbar muscles from lifting. Supportive care and Flexeril.  - cyclobenzaprine (FLEXERIL) 10 MG tablet; Take 1 tablet (10 mg total) by mouth 3 (three) times daily as needed for muscle spasms.  Dispense: 30 tablet; Refill: 0     Phill Myron, D.O. 10/22/2020, 10:54 AM Primary Care at [facility]

## 2020-10-21 ENCOUNTER — Other Ambulatory Visit: Payer: Self-pay

## 2020-10-21 ENCOUNTER — Ambulatory Visit (INDEPENDENT_AMBULATORY_CARE_PROVIDER_SITE_OTHER): Payer: PRIVATE HEALTH INSURANCE | Admitting: Licensed Clinical Social Worker

## 2020-10-21 DIAGNOSIS — F411 Generalized anxiety disorder: Secondary | ICD-10-CM

## 2020-10-22 ENCOUNTER — Telehealth: Payer: Self-pay | Admitting: Orthopedic Surgery

## 2020-10-22 NOTE — Telephone Encounter (Signed)
Received $25.00 cash and FMLA forms from patient    Forwarding to [facility] today

## 2020-10-23 NOTE — BH Specialist Note (Signed)
Follow up call placed to patient. Pt reports that at last visit with PCP, medications were adjusted and she has been on Prozac for approximately one week. Pt reports one side effect, "bad taste in my mouth"; however, shared that it occurs occasionally.   Pt reports decrease in anxiety symptoms, especially racing thoughts. Pt has an upcoming surgery scheduled noting that she has arrangements for family and friends to assist with recovery. No additional concerns noted.

## 2020-11-11 ENCOUNTER — Other Ambulatory Visit: Payer: Self-pay

## 2020-11-11 ENCOUNTER — Telehealth (INDEPENDENT_AMBULATORY_CARE_PROVIDER_SITE_OTHER): Payer: PRIVATE HEALTH INSURANCE | Admitting: Internal Medicine

## 2020-11-11 ENCOUNTER — Other Ambulatory Visit: Payer: Self-pay | Admitting: Internal Medicine

## 2020-11-11 ENCOUNTER — Ambulatory Visit (INDEPENDENT_AMBULATORY_CARE_PROVIDER_SITE_OTHER): Payer: PRIVATE HEALTH INSURANCE | Admitting: Licensed Clinical Social Worker

## 2020-11-11 DIAGNOSIS — J42 Unspecified chronic bronchitis: Secondary | ICD-10-CM | POA: Diagnosis not present

## 2020-11-11 DIAGNOSIS — F411 Generalized anxiety disorder: Secondary | ICD-10-CM

## 2020-11-11 DIAGNOSIS — J302 Other seasonal allergic rhinitis: Secondary | ICD-10-CM | POA: Diagnosis not present

## 2020-11-11 MED ORDER — ALBUTEROL SULFATE (2.5 MG/3ML) 0.083% IN NEBU: 2.5000 mg | INHALATION_SOLUTION | Freq: Four times a day (QID) | RESPIRATORY_TRACT | 1 refills | Status: DC | PRN

## 2020-11-11 MED ORDER — IPRATROPIUM BROMIDE 0.06 % NA SOLN: 2.0000 | Freq: Four times a day (QID) | NASAL | 12 refills | Status: DC

## 2020-11-11 MED FILL — IPRATROPIUM 0.06% SPRAY: 0.06 | 20 days supply | Qty: 15 | Fill #0

## 2020-11-11 NOTE — BH Specialist Note (Signed)
Follow up call placed to patient. Pt reports doing well with the medications; however, reports feeling drowsy when she awakens. Anxiety has improved significantly. Pt shared that she is nervous about the pain associated with post surgery.   Pt agreed to speak to specialist about medication preferences prior to surgery. She continues utilizing healthy coping skills (positive affirmations, music, scripture, etc)

## 2020-11-11 NOTE — Progress Notes (Signed)
Virtual Visit via Telephone Note  I connected with [name], on 11/11/2020 at 11:14 AM by telephone due to the COVID-19 pandemic and verified that I am speaking with the correct person using two identifiers.   Consent: I discussed the limitations, risks, security and privacy concerns of performing an evaluation and management service by telephone and the availability of in person appointments. I also discussed with the patient that there may be a patient responsible charge related to this service. The patient expressed understanding and agreed to proceed.   Location of Patient: Home   Location of Provider: Clinic    Persons participating in Telemedicine visit: Dona Walby [facility] Dr. Earlene Plater      History of Present Illness: Patient has a visit to f/u on anxiety. At last visit, discontinued Zoloft due to side effects and started Prozac. Feels like things are better with Prozac then Zoloft. No side effect concerns. Feels like anxiety is better controlled. Has follow up with Jenel Lucks, LCSW later today after this visit.   Also has concerns about chronic cough. Improved with treatment of COPD exacerbation at last visit. But still has occasional dry cough. Asks for refill of albuterol inhaler and ipratropium nasal spray as this has helped control her cough in the past.   GAD 7 : Generalized Anxiety Score 11/11/2020 10/13/2020 09/16/2020 09/04/2020  Nervous, Anxious, on Edge 0 1 2 2   Control/stop worrying 1 1 2 2   Worry too much - different things 1 1 1 1   Trouble relaxing 0 0 1 2  Restless 0 0 0 1  Easily annoyed or irritable 0 1 1 1   Afraid - awful might happen 0 0 0 1  Total GAD 7 Score 2 4 7 10       Past Medical History:  Diagnosis Date  . Anemia   . GERD (gastroesophageal reflux disease)   . Headache(784.0)   . History of blood transfusion    Hx; of in 1991 after delivery  . History of TIAs   . Numbness    Right hand  . Pneumonia   . Stroke [facility])  05/2013   Allergies  Allergen Reactions  . Shellfish Allergy Itching and Swelling  . Latex Itching and Rash    Current Outpatient Medications on File Prior to Visit  Medication Sig Dispense Refill  . aspirin-acetaminophen-caffeine (EXCEDRIN MIGRAINE) 250-250-65 MG tablet Take 2 tablets by mouth 2 (two) times daily as needed for headache.    azithromycin (ZITHROMAX Z-PAK) 250 MG tablet Take two tablets on day one, followed by one tablet daily for 4 days. (Patient not taking: Reported on 11/06/2020) 6 each 0  . cyclobenzaprine (FLEXERIL) 10 MG tablet Take 1 tablet (10 mg total) by mouth 3 (three) times daily as needed for muscle spasms. (Patient not taking: Reported on 11/06/2020) 30 tablet 0  . FLUoxetine (PROZAC) 10 MG capsule Take 1 capsule (10 mg total) by mouth daily. 30 capsule 1  . omeprazole (PRILOSEC) 20 MG capsule Take 20 mg by mouth daily.    . predniSONE (DELTASONE) 20 MG tablet Take 2 tablets (40 mg total) by mouth daily with breakfast. (Patient not taking: Reported on 11/06/2020) 10 tablet 0  . traMADol (ULTRAM) 50 MG tablet Take 1 tablet (50 mg total) by mouth every 12 (twelve) hours as needed. (Patient not taking: Reported on 11/06/2020) 30 tablet 0   No current facility-administered medications on file prior to visit.    Observations/Objective: NAD. Speaking clearly.  Work of  breathing normal.  Alert and oriented. Mood appropriate.   Assessment and Plan: 1. Generalized anxiety disorder GAD-7 score of 2 shows excellent control of her anxiety symptoms. Patient reports no concerns. Continue Prozac 10 mg. Follow up scheduled with Christa See, LCSW.  2. Chronic bronchitis, unspecified chronic bronchitis type (HCC) - albuterol (PROVENTIL) (2.5 MG/3ML) 0.083% nebulizer solution; Take 3 mLs (2.5 mg total) by nebulization every 6 (six) hours as needed for wheezing or shortness of breath.  Dispense: 150 mL; Refill: 1  3. Seasonal allergic rhinitis, unspecified trigger -  ipratropium (ATROVENT) 0.06 % nasal spray; Place 2 sprays into both nostrils 4 (four) times daily.  Dispense: 15 mL; Refill: 12   Follow Up Instructions: 3 month f/u for GAD   I discussed the assessment and treatment plan with the patient. The patient was provided an opportunity to ask questions and all were answered. The patient agreed with the plan and demonstrated an understanding of the instructions.   The patient was advised to call back or seek an in-person evaluation if the symptoms worsen or if the condition fails to improve as anticipated.     I provided 9 minutes total of non-face-to-face time during this encounter including median intraservice time, reviewing previous notes, investigations, ordering medications, medical decision making, coordinating care and patient verbalized understanding at the end of the visit.    Phill Myron, D.O. Primary Care at [facility]  11/11/2020, 11:14 AM

## 2020-11-11 NOTE — Pre-Procedure Instructions (Signed)
[facility] [place], [place] - [address] [place] [address] Phone: [phone] Fax: [phone]  [facility] [address] [place]), [place] [address] [place] ([place]) [place] [address] Phone: [phone] Fax: [phone]     Your procedure is scheduled on Tuesday January 11th.  Report to [facility] Main Entrance "A" at 5:30 A.M., and check in at the Admitting office.  Call this number if you have problems the morning of surgery:  [phone]  Call [phone] if you have any questions prior to your surgery date Monday-Friday 8am-4pm    Remember:  Do not eat after midnight the night before your surgery  You may drink clear liquids until 4:30 the morning of your surgery.   Clear liquids allowed are: Water, Non-Citrus Juices (without pulp), Carbonated Beverages, Clear Tea, Black Coffee Only, and Gatorade Patient Instructions  . The night before surgery:  o No food after midnight. ONLY clear liquids after midnight  . The day of surgery (if you do NOT have diabetes):  o Drink ONE (1) Pre-Surgery Clear Ensure as directed.   o This drink was given to you during your hospital  pre-op appointment visit. o The pre-op nurse will instruct you on the time to drink the  Pre-Surgery Ensure depending on your surgery time. o Finish the drink by 4:30 am the morning of your surgery o Nothing else to drink after completing the  Pre-Surgery Clear Ensure.         If you have questions, please contact your surgeon's office.     Take these medicines the morning of surgery with A SIP OF WATER  FLUoxetine (PROZAC)  omeprazole (PRILOSEC) albuterol (PROVENTIL)-if needed ipratropium (ATROVENT) nasal spray  As of today, STOP taking any Aspirin (unless otherwise instructed by your surgeon) Excedrin Migraine, Aleve, Naproxen, Ibuprofen, Motrin, Advil, Goody's, BC's, all herbal medications, fish oil, and all vitamins.                       Do not wear jewelry, make up, or nail polish            Do not wear lotions, powders, perfumes/colognes, or deodorant.            Do not shave 48 hours prior to surgery.  Men may shave face and neck.            Do not bring valuables to the hospital.            [facility] is not responsible for any belongings or valuables.  Do NOT Smoke (Tobacco/Vaping) or drink Alcohol 24 hours prior to your procedure If you use a CPAP at night, you may bring all equipment for your overnight stay.   Contacts, glasses, dentures or bridgework may not be worn into surgery.      For patients admitted to the hospital, discharge time will be determined by your treatment team.   Patients discharged the day of surgery will not be allowed to drive home, and someone needs to stay with them for 24 hours.    Special instructions:   [place]- Preparing For Surgery  Before surgery, you can play an important role. Because skin is not sterile, your skin needs to be as free of germs as possible. You can reduce the number of germs on your skin by washing with CHG (chlorahexidine gluconate) Soap before surgery.  CHG is an antiseptic cleaner which  kills germs and bonds with the skin to continue killing germs even after washing.    Oral Hygiene is also important to reduce your risk of infection.  Remember - BRUSH YOUR TEETH THE MORNING OF SURGERY WITH YOUR REGULAR TOOTHPASTE  Please do not use if you have an allergy to CHG or antibacterial soaps. If your skin becomes reddened/irritated stop using the CHG.  Do not shave (including legs and underarms) for at least 48 hours prior to first CHG shower. It is OK to shave your face.  Please follow these instructions carefully.   1. Shower the NIGHT BEFORE SURGERY and the MORNING OF SURGERY with CHG Soap.   2. If you chose to wash your hair, wash your hair first as usual with your normal shampoo.  3. After you shampoo, rinse your hair and body thoroughly  to remove the shampoo.  4. Use CHG as you would any other liquid soap. You can apply CHG directly to the skin and wash gently with a scrungie or a clean washcloth.   5. Apply the CHG Soap to your body ONLY FROM THE NECK DOWN.  Do not use on open wounds or open sores. Avoid contact with your eyes, ears, mouth and genitals (private parts). Wash Face and genitals (private parts)  with your normal soap.   6. Wash thoroughly, paying special attention to the area where your surgery will be performed.  7. Thoroughly rinse your body with warm water from the neck down.  8. DO NOT shower/wash with your normal soap after using and rinsing off the CHG Soap.  9. Pat yourself dry with a CLEAN TOWEL.  10. Wear CLEAN PAJAMAS to bed the night before surgery  11. Place CLEAN SHEETS on your bed the night of your first shower and DO NOT SLEEP WITH PETS.   Day of Surgery: Wear Clean/Comfortable clothing the morning of surgery Do not apply any deodorants/lotions.   Remember to brush your teeth WITH YOUR REGULAR TOOTHPASTE.   Please read over the following fact sheets that you were given.

## 2020-11-12 ENCOUNTER — Inpatient Hospital Stay (HOSPITAL_COMMUNITY)
Admission: RE | Admit: 2020-11-12 | Discharge: 2020-11-12 | Disposition: A | Discharge status: Home / Self Care | Payer: PRIVATE HEALTH INSURANCE | Source: Ambulatory Visit

## 2020-11-13 ENCOUNTER — Encounter (HOSPITAL_COMMUNITY): Payer: Self-pay | Admitting: Vascular Surgery

## 2020-11-13 ENCOUNTER — Other Ambulatory Visit: Payer: Self-pay

## 2020-11-13 ENCOUNTER — Encounter (HOSPITAL_COMMUNITY): Payer: Self-pay

## 2020-11-13 ENCOUNTER — Encounter (HOSPITAL_COMMUNITY)
Admission: RE | Admit: 2020-11-13 | Discharge: 2020-11-13 | Disposition: A | Discharge status: Home / Self Care | Payer: PRIVATE HEALTH INSURANCE | Source: Ambulatory Visit | Attending: Orthopedic Surgery | Admitting: Orthopedic Surgery

## 2020-11-13 DIAGNOSIS — Z01812 Encounter for preprocedural laboratory examination: Secondary | ICD-10-CM | POA: Diagnosis present

## 2020-11-13 HISTORY — DX: Anxiety disorder, unspecified: F41.9

## 2020-11-13 HISTORY — DX: Unspecified osteoarthritis, unspecified site: M19.90

## 2020-11-13 HISTORY — DX: Chronic obstructive pulmonary disease, unspecified: J44.9

## 2020-11-13 LAB — URINALYSIS, ROUTINE W REFLEX MICROSCOPIC
Bilirubin Urine: NEGATIVE
Glucose, UA: NEGATIVE mg/dL
Hgb urine dipstick: NEGATIVE
Ketones, ur: NEGATIVE mg/dL
Leukocytes,Ua: NEGATIVE
Nitrite: NEGATIVE
Protein, ur: NEGATIVE mg/dL
Specific Gravity, Urine: 1.033 — ABNORMAL HIGH (ref 1.005–1.030)
pH: 5 (ref 5.0–8.0)

## 2020-11-13 LAB — BASIC METABOLIC PANEL
Anion gap: 8 (ref 5–15)
BUN: 11 mg/dL (ref 6–20)
CO2: 24 mmol/L (ref 22–32)
Calcium: 8.4 mg/dL — ABNORMAL LOW (ref 8.9–10.3)
Chloride: 107 mmol/L (ref 98–111)
Creatinine, Ser: 0.81 mg/dL (ref 0.44–1.00)
GFR, Estimated: >60 mL/min (ref >60)
Glucose, Bld: 93 mg/dL (ref 70–99)
Potassium: 3.8 mmol/L (ref 3.5–5.1)
Sodium: 139 mmol/L (ref 135–145)

## 2020-11-13 LAB — CBC
HCT: 27.5 % — ABNORMAL LOW (ref 36.0–46.0)
Hemoglobin: 7 g/dL — ABNORMAL LOW (ref 12.0–15.0)
MCH: 15.9 pg — ABNORMAL LOW (ref 26.0–34.0)
MCHC: 25.5 g/dL — ABNORMAL LOW (ref 30.0–36.0)
MCV: 62.6 fL — ABNORMAL LOW (ref 80.0–100.0)
Platelets: 301 10*3/uL (ref 150–400)
RBC: 4.39 MIL/uL (ref 3.87–5.11)
RDW: 23 % — ABNORMAL HIGH (ref 11.5–15.5)
WBC: 6.7 10*3/uL (ref 4.0–10.5)
nRBC: 0 % (ref 0.0–0.2)

## 2020-11-13 LAB — SURGICAL PCR SCREEN
MRSA, PCR: POSITIVE — AB
Staphylococcus aureus: POSITIVE — AB

## 2020-11-13 NOTE — Pre-Procedure Instructions (Signed)
[place], [place] [address] [place] [address] Phone: [phone] Fax: [place]), [place] [id number] [place] ([place])  [address] Phone: [phone] Fax: [phone]     Your procedure is scheduled on Tuesday January 11th.  Report to [place] [facility] at 5:30 A.M., and check in at the Admitting office.  Call this number if you have problems the morning of surgery:  [phone]  Call [phone] if you have any questions prior to your surgery date Monday-Friday 8am-4pm    Remember:  Do not eat after midnight the night before your surgery  You may drink clear liquids until 4:30 the morning of your surgery.   Clear liquids allowed are: Water, Non-Citrus Juices (without pulp), Carbonated Beverages, Clear Tea, Black Coffee Only, and Gatorade Patient Instructions  . The night before surgery:  o No food after midnight. ONLY clear liquids after midnight  . The day of surgery (if you do NOT have diabetes):  o Drink ONE (1) Pre-Surgery Clear Ensure as directed.   o This drink was given to you during your hospital  pre-op appointment visit. o The pre-op nurse will instruct you on the time to drink the  Pre-Surgery Ensure depending on your surgery time. o Finish the drink by 4:30 am the morning of your surgery o Nothing else to drink after completing the  Pre-Surgery Clear Ensure.         If you have questions, please contact your surgeon's office.     Take these medicines the morning of surgery with A SIP OF WATER  FLUoxetine (PROZAC)  omeprazole (PRILOSEC) albuterol (PROVENTIL)-if needed ipratropium (ATROVENT) nasal spray  As of today, STOP taking any Aspirin (unless otherwise instructed by your surgeon) Excedrin Migraine, Aleve, Naproxen, Ibuprofen, Motrin, Advil, Goody's, BC's, all herbal medications, fish oil, and all vitamins.                       Do not wear jewelry, make up, or nail polish            Do not wear lotions, powders, perfumes/colognes, or deodorant.            Do not shave 48 hours prior to surgery.  Men may shave face and neck.            Do not bring valuables to the hospital.            [facility] is not responsible for any belongings or valuables.  Do NOT Smoke (Tobacco/Vaping) or drink Alcohol 24 hours prior to your procedure If you use a CPAP at night, you may bring all equipment for your overnight stay.   Contacts, glasses, dentures or bridgework may not be worn into surgery.      For patients admitted to the hospital, discharge time will be determined by your treatment team.   Patients discharged the day of surgery will not be allowed to drive home, and someone needs to stay with them for 24 hours.    Special instructions:   [place]- Preparing For Surgery  Before surgery, you can play an important role. Because skin is not sterile, your skin needs to be as free of germs as possible. You can reduce the number of germs on your skin by washing with CHG (chlorahexidine gluconate) Soap before surgery.  CHG is an antiseptic cleaner which  kills germs and bonds with the skin to continue killing germs even after washing.    Oral Hygiene is also important to reduce your risk of infection.  Remember - BRUSH YOUR TEETH THE MORNING OF SURGERY WITH YOUR REGULAR TOOTHPASTE  Please do not use if you have an allergy to CHG or antibacterial soaps. If your skin becomes reddened/irritated stop using the CHG.  Do not shave (including legs and underarms) for at least 48 hours prior to first CHG shower. It is OK to shave your face.  Please follow these instructions carefully.   1. Shower the NIGHT BEFORE SURGERY and the MORNING OF SURGERY with CHG Soap.   2. If you chose to wash your hair, wash your hair first as usual with your normal shampoo.  3. After you shampoo, rinse your hair and body thoroughly  to remove the shampoo.  4. Use CHG as you would any other liquid soap. You can apply CHG directly to the skin and wash gently with a scrungie or a clean washcloth.   5. Apply the CHG Soap to your body ONLY FROM THE NECK DOWN.  Do not use on open wounds or open sores. Avoid contact with your eyes, ears, mouth and genitals (private parts). Wash Face and genitals (private parts)  with your normal soap.   6. Wash thoroughly, paying special attention to the area where your surgery will be performed.  7. Thoroughly rinse your body with warm water from the neck down.  8. DO NOT shower/wash with your normal soap after using and rinsing off the CHG Soap.  9. Pat yourself dry with a CLEAN TOWEL.  10. Wear CLEAN PAJAMAS to bed the night before surgery  11. Place CLEAN SHEETS on your bed the night of your first shower and DO NOT SLEEP WITH PETS.   Day of Surgery: Wear Clean/Comfortable clothing the morning of surgery Do not apply any deodorants/lotions.   Remember to brush your teeth WITH YOUR REGULAR TOOTHPASTE.   Please read over the following fact sheets that you were given.

## 2020-11-13 NOTE — Progress Notes (Addendum)
PCP - [name] Earlene Plater [facility]. Cardiologist -  na   Chest x-ray - na EKG - na Stress Test - na ECHO - 08/24/13 Cardiac Cath - na  Sleep Study - na   Blood Thinner Instructions:  na Aspirin Instructions:  na  ERAS Protcol -yes PRE-SURGERY Ensure given  COVID TEST- 11/14/20   Anesthesia review: pre-admission labs, reviewed with [name] PA  Patient denies shortness of breath, fever, cough and chest pain at PAT appointment   All instructions explained to the patient, with a verbal understanding of the material. Patient agrees to go over the instructions while at home for a better understanding. Patient also instructed to self quarantine after being tested for COVID-19. The opportunity to ask questions was provided.

## 2020-11-13 NOTE — Progress Notes (Signed)
Anesthesia Chart Review:  Case: [id number] Date/Time: 11/18/20 0715   Procedure: RIGHT TOTAL KNEE ARTHROPLASTY (Right Knee)   Anesthesia type: Spinal   Pre-op diagnosis: right knee ostearthritis   Location: [facility] ROOM 05 / [facility]   Surgeons: Cammy Copa, MD      DISCUSSION: Patient is a 50 year old female scheduled for the above procedure. PAT RN visit was done at [facility], but case now moved to [facility].  History includes former smoker (quit 10/09/15), CVA (05/18/13 presented with left hand numbness; 08/23/13, presented with RUE numbness), GERD, COPD, anemia, anxiety, C4-6 ACDF (03/06/14), cholecystectomy (04/04/03). BMI is consistent with obesity.   2nd Moderna COVID-19 vaccine 11/3019.  Last EKG seen is from 2016.   Preoperative labs noted. H/H 7.0/27.5. HGB has been in the 7 range since 02/2020 (11.1 on 01/20/18). She has been on iron supplements in the past. There is also a referral to GYN for uterine leiomyoma from 08/26/20. Dr. August Saucer aware and plans to cancel case due to anemia. Debbie at his office to contact patient.     VS: BP (!) 144/83   Pulse 78   Temp 36.7 C (Oral)   Resp 18   Ht 5' 3.5" (1.613 m)   Wt 86.5 kg   LMP 10/24/2020   SpO2 100%   BMI 33.25 kg/m     PROVIDERS: Arvilla Market, DO is PCP     LABS: Preoperative labs noted. See DISCUSSION. (all labs ordered are listed, but only abnormal results are displayed)  Labs Reviewed  CBC - Abnormal; Notable for the following components:      Result Value   Hemoglobin 7.0 (*)    HCT 27.5 (*)    MCV 62.6 (*)    MCH 15.9 (*)    MCHC 25.5 (*)    RDW 23.0 (*)    All other components within normal limits  BASIC METABOLIC PANEL - Abnormal; Notable for the following components:   Calcium 8.4 (*)    All other components within normal limits  URINALYSIS, ROUTINE W REFLEX MICROSCOPIC - Abnormal; Notable for the following components:   APPearance HAZY (*)    Specific Gravity, Urine 1.033 (*)    All other components  within normal limits  SURGICAL PCR SCREEN  URINE CULTURE   CBC Latest Ref Rng & Units 11/13/2020 09/01/2020 02/14/2020  WBC 4.0 - 10.5 K/uL 6.7 5.1 5.8  Hemoglobin 12.0 - 15.0 g/dL 7.0(L) 7.2(L) 7.8(L)  Hematocrit 36.0 - 46.0 % 27.5(L) 27.5(L) 29.7(L)  Platelets 150 - 400 K/uL 301 394 259    IMAGES: CT Head 08/25/20: IMPRESSION: No acute intracranial abnormalities.   EKG: Last EKG noted was from 10/31/15 during admission for COPD exacerbation.    CV: Echo (TTE) 08/24/13: Study Conclusions  - Left ventricle: The cavity size was normal. Wall thickness  was normal. Systolic function was normal. The estimated  ejection fraction was in the range of 55% to 60%.  - Mitral valve: Mild regurgitation.  - Pulmonary arteries: PA peak pressure: 52mm Hg (S).    TEE 05/21/13: Study Conclusions  - Left ventricle: Systolic function was normal. The  estimated ejection fraction was in the range of 55% to  60%. Wall motion was normal; there were no regional wall  motion abnormalities.  - Mitral valve: Mild regurgitation.  - Left atrium: No evidence of thrombus in the atrial cavity  or appendage.  - Right atrium: No evidence of thrombus in the atrial cavity  or appendage.  -  Atrial septum: No defect or patent foramen ovale was  identified. Echo contrast study showed no right-to-left  atrial level shunt, at baseline or with provocation.  Impressions:  - No cardiac source of emboli was indentified.    Past Medical History:  Diagnosis Date  . Anemia   . Anxiety   . Arthritis    right knee, back  . COPD (chronic obstructive pulmonary disease) ([place])   . GERD (gastroesophageal reflux disease)   . Headache(784.0)   . History of blood transfusion    Hx; of in 1991 after delivery  . History of TIAs   . Numbness    Right hand  . Pneumonia   . Stroke [facility]) 05/2013    Past Surgical History:  Procedure Laterality Date  . ANTERIOR CERVICAL DECOMP/DISCECTOMY FUSION N/A  03/06/2014   Procedure: ANTERIOR CERVICAL DECOMPRESSION/DISCECTOMY FUSION 2 LEVELS four/five, five/six;  Surgeon: Eustace Moore, MD;  Location: [facility] NEURO ORS;  Service: Neurosurgery;  Laterality: N/A;  . CESAREAN SECTION     x 1 with 5th pregnancy  . CHOLECYSTECTOMY    . KNEE ARTHROSCOPY Right 2015  . TEE WITHOUT CARDIOVERSION N/A 05/21/2013   Procedure: TRANSESOPHAGEAL ECHOCARDIOGRAM (TEE);  Surgeon: Sueanne Margarita, MD;  Location: [facility] ENDOSCOPY;  Service: Cardiovascular;  Laterality: N/A;  . TUBAL LIGATION      MEDICATIONS: . albuterol (PROVENTIL) (2.5 MG/3ML) 0.083% nebulizer solution  . aspirin-acetaminophen-caffeine (EXCEDRIN MIGRAINE) 250-250-65 MG tablet  . azithromycin (ZITHROMAX Z-PAK) 250 MG tablet  . cyclobenzaprine (FLEXERIL) 10 MG tablet  . FLUoxetine (PROZAC) 10 MG capsule  . ipratropium (ATROVENT) 0.06 % nasal spray  . omeprazole (PRILOSEC) 20 MG capsule  . predniSONE (DELTASONE) 20 MG tablet  . traMADol (ULTRAM) 50 MG tablet   No current facility-administered medications for this encounter.   By current medication list, she is no longer taking Zithromax, Flexeril, tramadol, prednisone.   Myra Gianotti, PA-C Surgical Short Stay/Anesthesiology [facility] Phone [phone] [facility] Phone [phone] 11/13/2020 2:36 PM

## 2020-11-14 ENCOUNTER — Other Ambulatory Visit: Payer: Self-pay

## 2020-11-14 ENCOUNTER — Other Ambulatory Visit (HOSPITAL_COMMUNITY): Payer: PRIVATE HEALTH INSURANCE

## 2020-11-14 ENCOUNTER — Emergency Department (HOSPITAL_BASED_OUTPATIENT_CLINIC_OR_DEPARTMENT_OTHER)
Admission: EM | Admit: 2020-11-14 | Discharge: 2020-11-14 | Disposition: A | Discharge status: Home / Self Care | Payer: PRIVATE HEALTH INSURANCE | Attending: Emergency Medicine | Admitting: Emergency Medicine

## 2020-11-14 ENCOUNTER — Encounter (HOSPITAL_BASED_OUTPATIENT_CLINIC_OR_DEPARTMENT_OTHER): Payer: Self-pay | Admitting: *Deleted

## 2020-11-14 ENCOUNTER — Telehealth: Payer: Self-pay

## 2020-11-14 ENCOUNTER — Telehealth: Payer: Self-pay | Admitting: Orthopedic Surgery

## 2020-11-14 DIAGNOSIS — Z5321 Procedure and treatment not carried out due to patient leaving prior to being seen by health care provider: Secondary | ICD-10-CM | POA: Diagnosis not present

## 2020-11-14 DIAGNOSIS — R5383 Other fatigue: Secondary | ICD-10-CM | POA: Diagnosis not present

## 2020-11-14 DIAGNOSIS — D509 Iron deficiency anemia, unspecified: Secondary | ICD-10-CM

## 2020-11-14 DIAGNOSIS — D649 Anemia, unspecified: Secondary | ICD-10-CM | POA: Insufficient documentation

## 2020-11-14 NOTE — Telephone Encounter (Signed)
Pt called stating that her Hemoglobin is a 7 and they cant do surgery with it that low. She was supposed to have surgery tues. I have already added her to you schedule on Monday.

## 2020-11-14 NOTE — Telephone Encounter (Signed)
Pt wants to know why her surgery was canceled.

## 2020-11-14 NOTE — ED Notes (Signed)
Called patient's name in waiting room to draw labs, no answer.

## 2020-11-14 NOTE — ED Notes (Signed)
Called x3 times, pt is  not in lobby.

## 2020-11-14 NOTE — ED Triage Notes (Signed)
She had pre surgical lab work. The lab called her to let her know her HGB was 7. Her MD is out of town and she was told to come to the ER. She has a hx of anemia. C.o fatigue.

## 2020-11-15 LAB — URINE CULTURE: Culture: <10000 — AB

## 2020-11-17 ENCOUNTER — Other Ambulatory Visit: Payer: Self-pay

## 2020-11-17 ENCOUNTER — Ambulatory Visit (INDEPENDENT_AMBULATORY_CARE_PROVIDER_SITE_OTHER): Payer: PRIVATE HEALTH INSURANCE | Admitting: Family

## 2020-11-17 ENCOUNTER — Telehealth: Payer: Self-pay | Admitting: Orthopedic Surgery

## 2020-11-17 ENCOUNTER — Encounter: Payer: Self-pay | Admitting: Family

## 2020-11-17 VITALS — BP 138/92 | HR 106 | Wt 187.4 lb

## 2020-11-17 DIAGNOSIS — D509 Iron deficiency anemia, unspecified: Secondary | ICD-10-CM

## 2020-11-17 DIAGNOSIS — D649 Anemia, unspecified: Secondary | ICD-10-CM

## 2020-11-17 NOTE — Telephone Encounter (Signed)
Called and discussed

## 2020-11-17 NOTE — Progress Notes (Signed)
Patient ID: [name], female    DOB: [birth year]  MRN: [id number]  CC: Low Hemoglobin  Subjective: [name] is a 50 y.o. female who presents for low hemoglobin.  1. ANEMIA: Reports she was scheduled to have right total knee surgery on tomorrow. Says it was cancelled because of pre-admit blood work showed anemia. Says she has long history of anemia and has taken iron pills in the past and quit taking them because it caused constipation and does not prefer to start them again. Says she had iron infusion in the past which increased hemoglobin from about 7 to 11. 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 Considering returning to work if her surgery is not scheduled soon but says she plans to stay out of work for 1 more week.  Anemia status: uncontrolled Compliance with treatment: does not take iron pills because it caues constipation Iron supplementation side effects: constipation Severity of anemia: moderate Fatigue: yes Dyspnea on exertion: no Palpitations: no Bleeding: no Pica: yes  Patient Active Problem List   Diagnosis Date Noted  . Morning headache 06/02/2017  . Leiomyoma of body of uterus 12/24/2016  . Acute bronchitis 11/11/2016  . Prediabetes 11/11/2016  . Pap smear abnormality of cervix with ASCUS favoring benign 01/01/2016  . COPD (chronic obstructive pulmonary disease) ([place]) 11/18/2015  . GERD (gastroesophageal reflux disease) 11/18/2015  . Abnormal imaging of thyroid 11/04/2015  . Elevated d-dimer 11/01/2015  . IDA (iron deficiency anemia) 09/04/2015  . 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 S/P cervical spinal fusion 03/06/2014  . Obesity 02/22/2014  . Radicular pain in right arm 01/11/2014  . 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 Other and unspecified hyperlipidemia 08/24/2013  . History of CVA (cerebrovascular accident) 05/18/2013  . Tobacco abuse, 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     Current Outpatient Medications on File Prior to Visit   Medication Sig Dispense Refill  . albuterol (PROVENTIL) (2.5 MG/3ML) 0.083% nebulizer solution Take 3 mLs (2.5 mg total) by nebulization every 6 (six) hours as needed for wheezing or shortness of breath. 150 mL 1  . aspirin-acetaminophen-caffeine (EXCEDRIN MIGRAINE) 250-250-65 MG tablet Take 2 tablets by mouth 2 (two) times daily as needed for headache.    Marland Kitchen azithromycin (ZITHROMAX Z-PAK) 250 MG tablet Take two tablets on day one, followed by one tablet daily for 4 days. (Patient not taking: Reported on 11/06/2020) 6 each 0  . cyclobenzaprine (FLEXERIL) 10 MG tablet Take 1 tablet (10 mg total) by mouth 3 (three) times daily as needed for muscle spasms. (Patient not taking: Reported on 11/06/2020) 30 tablet 0  . FLUoxetine (PROZAC) 10 MG capsule Take 1 capsule (10 mg total) by mouth daily. 30 capsule 1  . ipratropium (ATROVENT) 0.06 % nasal spray Place 2 sprays into both nostrils 4 (four) times daily. (Patient taking differently: Place 2 sprays into both nostrils 4 (four) times daily as needed for rhinitis.) 15 mL 12  . omeprazole (PRILOSEC) 20 MG capsule Take 20 mg by mouth daily.    . predniSONE (DELTASONE) 20 MG tablet Take 2 tablets (40 mg total) by mouth daily with breakfast. (Patient not taking: Reported on 11/06/2020) 10 tablet 0  . traMADol (ULTRAM) 50 MG tablet Take 1 tablet (50 mg total) by mouth every 12 (twelve) hours as needed. (Patient not taking: Reported on 11/06/2020) 30 tablet 0   No current facility-administered medications on file prior to visit.    Allergies  Allergen Reactions  . Shellfish  Allergy Itching and Swelling  . Amoxicillin Other (See Comments)    Yeast infection  . Latex Itching and Rash    Social History   Socioeconomic History  . Marital status: Divorced    Spouse name: Not on file  . Number of children: 7  . Years of education: GEd  . Highest education level: Not on file  Occupational History  . Occupation: CNA    Employer: [employer]  Tobacco  Use  . Smoking status: Former Smoker    Packs/day: 0.00    Years: 26.00    Pack years: 0.00    Types: Cigarettes    Quit date: 10/09/2015    Years since quitting: 5.1  . Smokeless tobacco: Former Network engineer  . Vaping Use: Never used  Substance and Sexual Activity  . Alcohol use: Yes    Alcohol/week: 0.0 standard drinks    Comment: occ  . Drug use: No  . Sexual activity: Not on file  Other Topics Concern  . Not on file  Social History Narrative   Patient lives at home with her family    She has 7 children   5 grown and out of the house   2 at home 79 and 30   She works as a Quarry manager at [employer] Strain: Not on [employer] Insecurity: Not on file  Transportation Needs: Not on file  Physical Activity: Not on file  Stress: Not on file  Social Connections: Not on file  Intimate Partner Violence: Not on file    Family History  Problem Relation Age of Onset  . Renal Disease Father        dialysis  . Hypertension Father   . Heart disease Mother   . Heart disease Maternal Grandfather   . Asthma Sister   . Asthma Maternal Aunt     Past Surgical History:  Procedure Laterality Date  . ANTERIOR CERVICAL DECOMP/DISCECTOMY FUSION N/A 03/06/2014   Procedure: ANTERIOR CERVICAL DECOMPRESSION/DISCECTOMY FUSION 2 LEVELS four/five, five/six;  Surgeon: Eustace Moore, MD;  Location: [facility] NEURO ORS;  Service: Neurosurgery;  Laterality: N/A;  . CESAREAN SECTION     x 1 with 5th pregnancy  . CHOLECYSTECTOMY    . KNEE ARTHROSCOPY Right 2015  . TEE WITHOUT CARDIOVERSION N/A 05/21/2013   Procedure: TRANSESOPHAGEAL ECHOCARDIOGRAM (TEE);  Surgeon: Sueanne Margarita, MD;  Location: [facility] ENDOSCOPY;  Service: Cardiovascular;  Laterality: N/A;  . TUBAL LIGATION      ROS: Review of Systems Negative except as stated above  PHYSICAL EXAM: BP (!) 138/92 (BP Location: Left Arm, Patient Position: Sitting)   Pulse (!) 106   Wt 187 lb 6.4 oz  (85 kg)   LMP 10/24/2020   SpO2 98%   BMI 32.68 kg/m   Physical Exam Constitutional:      Appearance: She is obese.  HENT:     Head: Normocephalic.  Eyes:     Extraocular Movements: Extraocular movements intact.     Pupils: Pupils are equal, round, and reactive to light.  Cardiovascular:     Pulses: Normal pulses.     Heart sounds: Normal heart sounds.  Pulmonary:     Effort: Pulmonary effort is normal.     Breath sounds: Normal breath sounds.  Musculoskeletal:     Cervical back: Normal range of motion and neck supple.  Neurological:     General: No focal deficit present.  Mental Status: She is alert and oriented to person, place, and time.  Psychiatric:        Mood and Affect: Mood normal.        Behavior: Behavior normal.    ASSESSMENT AND PLAN: 1. Hemoglobin low: 2. Iron deficiency anemia, unspecified iron deficiency anemia type: - Reports she was scheduled to have right total knee surgery on tomorrow. Says surgery  was cancelled because of pre-admit blood work showed significant anemia. Says she has long history of anemia and has taken iron pills in the past and quit taking them because it caused constipation and does not prefer to start them again. Says she had iron infusion in the past which increased hemoglobin from about 7 to 11. 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 Considering returning to work if her surgery is not scheduled soon but says she plans to stay out of work for 1 more week. - Patient with chronic anemia.  - Iron panel for further evaluation.  - Last CBC obtained 11/13/2020. Repeat CBC in 4 to 6 weeks. - Referral to Hematology for iron infusion.  - Follow-up with primary physician as needed. - Iron, TIBC and Ferritin Panel - CBC; Future - Ambulatory referral to Hematology  Patient was given the opportunity to ask questions.  Patient verbalized understanding of the plan and was able to repeat key elements of the plan. Patient was given clear  instructions to go to Emergency Department or return to medical center if symptoms don't improve, worsen, or new problems develop.The patient verbalized understanding.   Orders Placed This Encounter  Procedures  . CBC  . Ambulatory referral to Hematology     Requested Prescriptions    No prescriptions requested or ordered in this encounter    Return in about 4 weeks (around 12/15/2020) for Dr. Juleen China.  Camillia Herter, NP

## 2020-11-17 NOTE — Telephone Encounter (Signed)
Pt called stating her surgery was cancelled due to her lab work and she had some questions for Lurena Joiner and would like a CB from him   [phone]

## 2020-11-17 NOTE — Progress Notes (Signed)
Was scheduled for right knee replacement surgery 1/11 but Hgb too low at 7.0 had to reschedule

## 2020-11-17 NOTE — Patient Instructions (Addendum)
Referral to [facility] Infusion Clinic.   Lab today and return in 6 weeks for repeat labs.   Follow-up with primary physician in 4 weeks or sooner if needed. Iron Deficiency Anemia, Adult Iron deficiency anemia is when you do not have enough red blood cells or hemoglobin in your blood. This happens because you have too little iron in your body. Hemoglobin carries oxygen to parts of the body. Anemia can cause your body to not get enough oxygen. What are the causes?  Not eating enough foods that have iron in them.  The body not being able to take in iron well.  Needing more iron due to pregnancy or heavy menstrual periods, for females.  Cancer.  Bleeding in the bowels.  Many blood draws. What increases the risk?  Being pregnant.  Being a teenage girl going through a growth spurt. What are the signs or symptoms?  Pale skin, lips, and nails.  Weakness, dizziness, and getting tired easily.  Headache.  Feeling like you cannot breathe well when moving (shortness of breath).  Cold hands and feet.  Fast heartbeat or a heartbeat that is not regular.  Feeling grouchy (irritable) or breathing fast. These are more common in very bad anemia. Mild anemia may not cause any symptoms. How is this treated? This condition is treated by finding out why you do not have enough iron and then getting more iron. It may include:  Adding foods to your diet that have a lot of iron.  Taking iron pills (supplements). If you are pregnant or breastfeeding, you may need to take extra iron. Your diet often does not provide the amount of iron that you need.  Getting more vitamin C in your diet. Vitamin C helps your body take in iron. You may need to take iron pills with a glass of orange juice or vitamin C pills.  Medicines to make heavy menstrual periods lighter.  Surgery. You may need blood tests to see if treatment is working. If the treatment does not seem to be working, you may need more  tests. Follow these instructions at home: Medicines  Take over-the-counter and prescription medicines only as told by your doctor. This includes iron pills and vitamins. ? Take iron pills when your stomach is empty. If you cannot handle this, take them with food. ? Do not drink milk or take antacids at the same time as your iron pills. ? Iron pills may turn your poop (stool)black.  If you cannot handle taking iron pills by mouth, ask your doctor about getting iron through: ? An IV tube. ? A shot (injection) into a muscle. Eating and drinking  Talk with your doctor before changing the foods you eat. He or she may tell you to eat foods that have a lot of iron, such as: ? Liver. ? Low-fat (lean) beef. ? Breads and cereals that have iron added to them. ? Eggs. ? Dried fruit. ? Dark green, leafy vegetables.  Eat fresh fruits and vegetables that are high in vitamin C. They help your body use iron. Foods with a lot of vitamin C include: ? Oranges. ? Peppers. ? Tomatoes. ? Mangoes.  Drink enough fluid to keep your pee (urine) pale yellow.   Managing constipation If you are taking iron pills, they may cause trouble pooping (constipation). To prevent or treat trouble pooping, you may need to:  Take over-the-counter or prescription medicines.  Eat foods that are high in fiber. These include beans, whole grains, and fresh fruits  and vegetables.  Limit foods that are high in fat and sugar. These include fried or sweet foods. General instructions  Return to your normal activities as told by your doctor. Ask your doctor what activities are safe for you.  Keep yourself clean, and keep things clean around you.  Keep all follow-up visits as told by your doctor. This is important. Contact a doctor if:  You feel like you may vomit (nauseous), or you vomit.  You feel weak.  You are sweating for no reason.  You have trouble pooping, such as: ? Pooping less than 3 times a  week. ? Straining to poop. ? Having poop that is hard, dry, or larger than normal. ? Feeling full or bloated. ? Pain in the lower belly. ? Not feeling better after pooping. Get help right away if:  You pass out (faint).  You have chest pain.  You have trouble breathing that: ? Is very bad. ? Gets worse with physical activity.  You have a fast heartbeat, or a heartbeat that does not feel regular.  You get light-headed when getting up from sitting or lying down. These symptoms may be an emergency. Do not wait to see if the symptoms will go away. Get medical help right away. Call your local emergency services (911 in the [place] Do not drive yourself to the hospital. Summary  Iron deficiency anemia is when you have too little iron in your body.  This condition is treated by finding out why you do not have enough iron in your body and then getting more iron.  Take over-the-counter and prescription medicines only as told by your doctor.  Eat fresh fruits and vegetables that are high in vitamin C.  Get help right away if you cannot breathe well. This information is not intended to replace advice given to you by your health care provider. Make sure you discuss any questions you have with your health care provider. Document Revised: 07/03/2019 Document Reviewed: 07/03/2019 Elsevier Patient Education  [place].

## 2020-11-18 ENCOUNTER — Ambulatory Visit (HOSPITAL_COMMUNITY)
Admission: RE | Admit: 2020-11-18 | Payer: PRIVATE HEALTH INSURANCE | Source: Ambulatory Visit | Admitting: Orthopedic Surgery

## 2020-11-18 ENCOUNTER — Encounter (HOSPITAL_COMMUNITY): Admission: RE | Payer: Self-pay | Source: Ambulatory Visit

## 2020-11-18 LAB — CBC
Hematocrit: 27.1 % — ABNORMAL LOW (ref 34.0–46.6)
Hemoglobin: 7.1 g/dL — ABNORMAL LOW (ref 11.1–15.9)
MCH: 16.1 pg — ABNORMAL LOW (ref 26.6–33.0)
MCHC: 26.2 g/dL — ABNORMAL LOW (ref 31.5–35.7)
MCV: 62 fL — ABNORMAL LOW (ref 79–97)
Platelets: 298 10*3/uL (ref 150–450)
RBC: 4.41 x10E6/uL (ref 3.77–5.28)
RDW: 21.7 % — ABNORMAL HIGH (ref 11.7–15.4)
WBC: 7.1 10*3/uL (ref 3.4–10.8)

## 2020-11-18 LAB — IRON,TIBC AND FERRITIN PANEL
Ferritin: 2 ng/mL — ABNORMAL LOW (ref 15–150)
Iron Saturation: 3 % — CL (ref 15–55)
Iron: 14 ug/dL — ABNORMAL LOW (ref 27–159)
Total Iron Binding Capacity: 437 ug/dL (ref 250–450)
UIBC: 423 ug/dL (ref 131–425)

## 2020-11-18 SURGERY — ARTHROPLASTY, KNEE, TOTAL
Anesthesia: Spinal | Site: Knee | Laterality: Right

## 2020-11-19 ENCOUNTER — Other Ambulatory Visit: Payer: Self-pay | Admitting: Family

## 2020-11-19 MED ORDER — FERROUS SULFATE 325 (65 FE) MG PO TABS: 325.0000 mg | ORAL_TABLET | Freq: Every day | ORAL | 0 refills | Status: DC

## 2020-11-19 NOTE — Progress Notes (Signed)
Please call patient with update.   Patient has chronic iron deficiency anemia.   Ferrous sulfate prescribed, take 1 pill daily with breakfast. Also, try to eat more foods that are high in iron such as meats, fish, beans, eggs, and dark leafy greens (kale, spinach). Eating these foods along with a food containing vitamin C (such as oranges or strawberries) helps the body to absorb the iron.   We will still continue with previous plan for iron infusion. However, while you are waiting for appointment the iron pills may help until then.

## 2020-11-19 NOTE — Addendum Note (Signed)
Addended by: Camillia Herter on: 11/19/2020 08:43 AM   Modules accepted: Orders

## 2020-11-20 ENCOUNTER — Telehealth (HOSPITAL_COMMUNITY): Payer: Self-pay | Admitting: General Practice

## 2020-11-20 ENCOUNTER — Other Ambulatory Visit: Payer: Self-pay

## 2020-11-20 ENCOUNTER — Other Ambulatory Visit: Payer: Self-pay | Admitting: Family

## 2020-11-20 DIAGNOSIS — D509 Iron deficiency anemia, unspecified: Secondary | ICD-10-CM

## 2020-11-20 MED ORDER — FERUMOXYTOL INJECTION 510 MG/17 ML: 510.0000 mg | Freq: Once | INTRAVENOUS | 0 refills | Status: DC

## 2020-11-20 NOTE — Telephone Encounter (Signed)
[facility] called if the patient's order is in [place]. The fax number and scheduler's phone for the [facility] was given to [facility].

## 2020-11-20 NOTE — Telephone Encounter (Signed)
Patient order for infusion has been placed in the system for scheduling with [facility].

## 2020-11-21 ENCOUNTER — Ambulatory Visit (HOSPITAL_COMMUNITY)
Admission: RE | Admit: 2020-11-21 | Discharge: 2020-11-21 | Disposition: A | Discharge status: Home / Self Care | Payer: PRIVATE HEALTH INSURANCE | Source: Ambulatory Visit | Attending: Internal Medicine | Admitting: Internal Medicine

## 2020-11-21 ENCOUNTER — Other Ambulatory Visit: Payer: Self-pay

## 2020-11-21 ENCOUNTER — Telehealth: Payer: Self-pay | Admitting: Internal Medicine

## 2020-11-21 DIAGNOSIS — D509 Iron deficiency anemia, unspecified: Secondary | ICD-10-CM | POA: Diagnosis not present

## 2020-11-21 MED ORDER — SODIUM CHLORIDE 0.9 % IV SOLN
510.0000 mg | Freq: Once | INTRAVENOUS | Status: AC
  Administered 2020-11-21: 510 mg via INTRAVENOUS
  Filled 2020-11-21: qty 510

## 2020-11-21 MED ORDER — SODIUM CHLORIDE 0.9 % IV SOLN
INTRAVENOUS | Status: DC | PRN
  Administered 2020-11-21: 250 mL via INTRAVENOUS

## 2020-11-21 NOTE — Progress Notes (Signed)
Patient received IV Feraheme as ordered by Gypsy Balsam, NP. Observed for at least 30 minutes post infusion.Tolerated well, vitals stable, discharge instructions given, verbalized understanding. Patient alert, oriented and ambulatory at the time of discharge.

## 2020-11-21 NOTE — Telephone Encounter (Signed)
Pt inquiring when does PCP need her to get new labwork to continue her infusion process. Please advise and thank you

## 2020-11-21 NOTE — Discharge Instructions (Signed)
Ferumoxytol injection What is this medicine? FERUMOXYTOL is an iron complex. Iron is used to make healthy red blood cells, which carry oxygen and nutrients throughout the body. This medicine is used to treat iron deficiency anemia. This medicine may be used for other purposes; ask your health care provider or pharmacist if you have questions. COMMON BRAND NAME(S): Feraheme What should I tell my health care provider before I take this medicine? They need to know if you have any of these conditions:  anemia not caused by low iron levels  high levels of iron in the blood  magnetic resonance imaging (MRI) test scheduled  an unusual or allergic reaction to iron, other medicines, foods, dyes, or preservatives  pregnant or trying to get pregnant  breast-feeding How should I use this medicine? This medicine is for injection into a vein. It is given by a health care professional in a hospital or clinic setting. Talk to your pediatrician regarding the use of this medicine in children. Special care may be needed. Overdosage: If you think you have taken too much of this medicine contact a poison control center or emergency room at once. NOTE: This medicine is only for you. Do not share this medicine with others. What if I miss a dose? It is important not to miss your dose. Call your doctor or health care professional if you are unable to keep an appointment. What may interact with this medicine? This medicine may interact with the following medications:  other iron products This list may not describe all possible interactions. Give your health care provider a list of all the medicines, herbs, non-prescription drugs, or dietary supplements you use. Also tell them if you smoke, drink alcohol, or use illegal drugs. Some items may interact with your medicine. What should I watch for while using this medicine? Visit your doctor or healthcare professional regularly. Tell your doctor or healthcare  professional if your symptoms do not start to get better or if they get worse. You may need blood work done while you are taking this medicine. You may need to follow a special diet. Talk to your doctor. Foods that contain iron include: whole grains/cereals, dried fruits, beans, or peas, leafy green vegetables, and organ meats (liver, kidney). What side effects may I notice from receiving this medicine? Side effects that you should report to your doctor or health care professional as soon as possible:  allergic reactions like skin rash, itching or hives, swelling of the face, lips, or tongue  breathing problems  changes in blood pressure  feeling faint or lightheaded, falls  fever or chills  flushing, sweating, or hot feelings  swelling of the ankles or feet Side effects that usually do not require medical attention (report to your doctor or health care professional if they continue or are bothersome):  diarrhea  headache  nausea, vomiting  stomach pain This list may not describe all possible side effects. Call your doctor for medical advice about side effects. You may report side effects to FDA at 1-800-FDA-1088. Where should I keep my medicine? This drug is given in a hospital or clinic and will not be stored at home. NOTE: This sheet is a summary. It may not cover all possible information. If you have questions about this medicine, talk to your doctor, pharmacist, or health care provider.  2021 Elsevier/Gold Standard (2016-12-13 20:21:10)  

## 2020-11-22 NOTE — Telephone Encounter (Signed)
Please call patient with update.   Patient will receive two iron infusions. Blood work will be checked 1 month after both iron infusions are complete.

## 2020-11-24 ENCOUNTER — Telehealth: Payer: Self-pay

## 2020-11-24 MED ORDER — FERUMOXYTOL INJECTION 510 MG/17 ML: 510.0000 mg | Freq: Once | INTRAVENOUS | 0 refills | Status: DC

## 2020-11-24 NOTE — Telephone Encounter (Signed)
Att to contact pt to advise her that we will get her second iron infusion scheduled and will recheck iron levels 1 month after 2nd infusion completed. No ans lvm

## 2020-11-24 NOTE — Addendum Note (Signed)
Addended by: Camillia Herter on: 11/24/2020 02:29 PM   Modules accepted: Orders

## 2020-11-24 NOTE — Telephone Encounter (Signed)
Please call patient with update.   Patient will receive two iron infusions. Blood work will be checked 1 month after both iron infusions are complete.

## 2020-11-25 ENCOUNTER — Telehealth: Payer: Self-pay | Admitting: Orthopedic Surgery

## 2020-11-25 NOTE — Telephone Encounter (Signed)
Note completed. She will try to access through mychart. If she is unable to she will let us know and we will print for her to pick up at front desk.

## 2020-11-25 NOTE — Telephone Encounter (Signed)
Patient called needing a note so that she can return to work until her surgery is rescheduled. Patient said she would like to return back to work tomorrow. The number to contact patient is [phone]

## 2020-11-26 ENCOUNTER — Telehealth: Payer: Self-pay

## 2020-11-26 ENCOUNTER — Other Ambulatory Visit: Payer: Self-pay

## 2020-11-26 DIAGNOSIS — D509 Iron deficiency anemia, unspecified: Secondary | ICD-10-CM

## 2020-11-26 MED ORDER — FERUMOXYTOL INJECTION 510 MG/17 ML: 510.0000 mg | Freq: Once | INTRAVENOUS | 0 refills | Status: DC

## 2020-11-26 NOTE — Progress Notes (Signed)
Iron infusion reordered for pt. Faxed to infusion ctr

## 2020-11-26 NOTE — Telephone Encounter (Signed)
Att to contact pt to advise 2nd Feraheme infusion scheduled for 01/24.No ans unable to lvm mailbox full

## 2020-11-27 ENCOUNTER — Inpatient Hospital Stay: Payer: PRIVATE HEALTH INSURANCE | Attending: Hematology and Oncology | Admitting: Hematology and Oncology

## 2020-11-27 ENCOUNTER — Encounter: Payer: Self-pay | Admitting: Hematology and Oncology

## 2020-11-27 ENCOUNTER — Other Ambulatory Visit: Payer: Self-pay

## 2020-11-27 ENCOUNTER — Inpatient Hospital Stay: Payer: PRIVATE HEALTH INSURANCE

## 2020-11-27 VITALS — BP 140/89 | HR 85 | Temp 97.1°F | Resp 17 | Ht 63.5 in | Wt 195.0 lb

## 2020-11-27 DIAGNOSIS — D500 Iron deficiency anemia secondary to blood loss (chronic): Secondary | ICD-10-CM

## 2020-11-27 DIAGNOSIS — R002 Palpitations: Secondary | ICD-10-CM | POA: Insufficient documentation

## 2020-11-27 DIAGNOSIS — R519 Headache, unspecified: Secondary | ICD-10-CM | POA: Insufficient documentation

## 2020-11-27 DIAGNOSIS — D509 Iron deficiency anemia, unspecified: Secondary | ICD-10-CM | POA: Insufficient documentation

## 2020-11-27 DIAGNOSIS — Z87891 Personal history of nicotine dependence: Secondary | ICD-10-CM | POA: Insufficient documentation

## 2020-11-27 LAB — CBC WITH DIFFERENTIAL (CANCER CENTER ONLY)
Abs Immature Granulocytes: 0.03 10*3/uL (ref 0.00–0.07)
Basophils Absolute: 0 10*3/uL (ref 0.0–0.1)
Basophils Relative: 1 %
Eosinophils Absolute: 0.1 10*3/uL (ref 0.0–0.5)
Eosinophils Relative: 1 %
HCT: 32.1 % — ABNORMAL LOW (ref 36.0–46.0)
Hemoglobin: 8.4 g/dL — ABNORMAL LOW (ref 12.0–15.0)
Immature Granulocytes: 0 %
Lymphocytes Relative: 31 %
Lymphs Abs: 2.4 10*3/uL (ref 0.7–4.0)
MCH: 18.6 pg — ABNORMAL LOW (ref 26.0–34.0)
MCHC: 26.2 g/dL — ABNORMAL LOW (ref 30.0–36.0)
MCV: 71.2 fL — ABNORMAL LOW (ref 80.0–100.0)
Monocytes Absolute: 0.5 10*3/uL (ref 0.1–1.0)
Monocytes Relative: 7 %
Neutro Abs: 4.8 10*3/uL (ref 1.7–7.7)
Neutrophils Relative %: 60 %
Platelet Count: 240 10*3/uL (ref 150–400)
RBC: 4.51 MIL/uL (ref 3.87–5.11)
RDW: 31.7 % — ABNORMAL HIGH (ref 11.5–15.5)
WBC Count: 7.9 10*3/uL (ref 4.0–10.5)
nRBC: 0.3 % — ABNORMAL HIGH (ref 0.0–0.2)

## 2020-11-27 LAB — CMP (CANCER CENTER ONLY)
ALT: 8 U/L (ref 0–44)
AST: 15 U/L (ref 15–41)
Albumin: 3.9 g/dL (ref 3.5–5.0)
Alkaline Phosphatase: 75 U/L (ref 38–126)
Anion gap: 8 (ref 5–15)
BUN: 13 mg/dL (ref 6–20)
CO2: 26 mmol/L (ref 22–32)
Calcium: 8.8 mg/dL — ABNORMAL LOW (ref 8.9–10.3)
Chloride: 105 mmol/L (ref 98–111)
Creatinine: 0.97 mg/dL (ref 0.44–1.00)
GFR, Estimated: >60 mL/min (ref >60)
Glucose, Bld: 107 mg/dL — ABNORMAL HIGH (ref 70–99)
Potassium: 4.2 mmol/L (ref 3.5–5.1)
Sodium: 139 mmol/L (ref 135–145)
Total Bilirubin: 0.4 mg/dL (ref 0.3–1.2)
Total Protein: 7.6 g/dL (ref 6.5–8.1)

## 2020-11-27 LAB — RETIC PANEL
Immature Retic Fract: 47.2 % — ABNORMAL HIGH (ref 2.3–15.9)
RBC.: 4.48 MIL/uL (ref 3.87–5.11)
Retic Count, Absolute: 278.2 10*3/uL — ABNORMAL HIGH (ref 19.0–186.0)
Retic Ct Pct: 6.2 % — ABNORMAL HIGH (ref 0.4–3.1)
Reticulocyte Hemoglobin: 30.2 pg (ref >27.9)

## 2020-11-27 NOTE — Progress Notes (Signed)
[place] [phone]   Fax:(336) [place] NOTE  Patient Care Team: Nicolette Bang, DO as PCP - General (Family Medicine)  Hematological/Oncological History # Iron Deficiency Anemia 2/2 to GYN Bleeding 01/20/2018: WBC 7.6, Hgb 11.1, MCV 78.2, Plt 264 02/12/2020: WBC 7.8, Hgb 7.0, MCV 61.2, Plt 317 11/13/2020: WBC 6.7, Hgb 7.0, MCV 62.6, Plt 301 11/17/2020: Iron 14, TIBC 437, Ferritin 2, Iron saturation 3% 11/27/2020: establish care with Dr. Lorenso Courier   CHIEF COMPLAINTS/PURPOSE OF CONSULTATION:  "Iron Deficiency Anemia "  HISTORY OF PRESENTING ILLNESS:  [name] 50 y.o. female with medical history significant for COPD, GERD, pneumonia, arthritis, and TIAs who presents for evaluation of longstanding iron deficiency anemia.   On review of the previous records [name] has a longstanding history of iron deficiency anemia dating back to at least July 2014.  She did have a normal on record in September 2010 at a hemoglobin of 12.1.  Her baseline hemoglobin typically ranges from approximately 7-9 though she did have a boost up to 11 in March 2019 (likely after the administration of IV iron).  Unfortunately she has maintained a low level of hemoglobin since 02/12/2020. .  Due to concern for this patient's longstanding iron deficiency anemia she was referred to hematology for further evaluation and management.  On exam today [name] that she has a longstanding history of iron deficiency anemia dating back to at least 38.  She reports that she did require a blood transfusion at time of childbirth at that time.  She notes that she did receive an iron infusion in 2019.  She has tried iron pills before but unfortunately they cause constipation that was quite severe.  In terms of symptoms she knows that she is "tired all the time".  And she notes that a flight of stairs can be difficult to scale.  She reports that she does have headaches, and  ice cravings and palpitations.  She denies having any issues with lightheadedness or dizziness.  She reports that she has regular menstrual periods which last approximately 5 days.  She notes that at the worst days she has to change a pad every 2 hours due to them being soaked.  She does that she has an unrestricted diet but does not eat a lot of red meat.  She notes that she prefers chicken.  On further discussion she reports that she has no family history remarkable for any blood diseases or blood disorders.  She reports that she was a smoker but quit smoking approximately 2015 after 17 years of smoking.  She otherwise denies have any issues with fevers, chills, sweats, nausea, or diarrhea.  A full 10 point ROS is listed below.  MEDICAL HISTORY:  Past Medical History:  Diagnosis Date  . Anemia   . Anxiety   . Arthritis    right knee, back  . COPD (chronic obstructive pulmonary disease) ([place])   . GERD (gastroesophageal reflux disease)   . Headache(784.0)   . History of blood transfusion    Hx; of in 1991 after delivery  . History of TIAs   . Numbness    Right hand  . Pneumonia   . Stroke [facility]) 05/2013    SURGICAL HISTORY: Past Surgical History:  Procedure Laterality Date  . ANTERIOR CERVICAL DECOMP/DISCECTOMY FUSION N/A 03/06/2014   Procedure: ANTERIOR CERVICAL DECOMPRESSION/DISCECTOMY FUSION 2 LEVELS four/five, five/six;  Surgeon: Eustace Moore, MD;  Location: [facility] NEURO ORS;  Service: Neurosurgery;  Laterality: N/A;  . CESAREAN SECTION     x 1 with 5th pregnancy  . CHOLECYSTECTOMY    . KNEE ARTHROSCOPY Right 2015  . TEE WITHOUT CARDIOVERSION N/A 05/21/2013   Procedure: TRANSESOPHAGEAL ECHOCARDIOGRAM (TEE);  Surgeon: Sueanne Margarita, MD;  Location: [facility] ENDOSCOPY;  Service: Cardiovascular;  Laterality: N/A;  . TUBAL LIGATION      SOCIAL HISTORY: Social History   Socioeconomic History  . Marital status: Divorced    Spouse name: Not on file  . Number of children: 7  . Years of  education: GEd  . Highest education level: Not on file  Occupational History  . Occupation: CNA    Employer: [employer]  Tobacco Use  . Smoking status: Former Smoker    Packs/day: 0.50    Years: 26.00    Pack years: 13.00    Types: Cigarettes    Quit date: 10/09/2015    Years since quitting: 5.1  . Smokeless tobacco: Former Network engineer  . Vaping Use: Never used  Substance and Sexual Activity  . Alcohol use: Yes    Alcohol/week: 0.0 standard drinks    Comment: occ  . Drug use: No  . Sexual activity: Not on file  Other Topics Concern  . Not on file  Social History Narrative   Patient lives at home with her family    She has 7 children   5 grown and out of the house   2 at home 60 and 82   She works as a Quarry manager at [employer] Strain: Not on [employer] Insecurity: Not on file  Transportation Needs: Not on file  Physical Activity: Not on file  Stress: Not on file  Social Connections: Not on file  Intimate Partner Violence: Not on file    FAMILY HISTORY: Family History  Problem Relation Age of Onset  . Renal Disease Father        dialysis  . Hypertension Father   . Heart disease Mother   . Heart disease Maternal Grandfather   . Asthma Sister   . Asthma Maternal Aunt     ALLERGIES:  is allergic to shellfish allergy, amoxicillin, and latex.  MEDICATIONS:  Current Outpatient Medications  Medication Sig Dispense Refill  . albuterol (PROVENTIL) (2.5 MG/3ML) 0.083% nebulizer solution Take 3 mLs (2.5 mg total) by nebulization every 6 (six) hours as needed for wheezing or shortness of breath. 150 mL 1  . aspirin-acetaminophen-caffeine (EXCEDRIN MIGRAINE) 250-250-65 MG tablet Take 2 tablets by mouth 2 (two) times daily as needed for headache.    . ferrous sulfate 325 (65 FE) MG tablet Take 1 tablet (325 mg total) by mouth daily with breakfast. (Patient not taking: Reported on 11/27/2020) 90 tablet 0  .  ferumoxytol (FERAHEME) 510 MG/17ML SOLN injection Inject 17 mLs (510 mg total) into the vein once for 1 dose. 17 mL 0  . FLUoxetine (PROZAC) 10 MG capsule Take 1 capsule (10 mg total) by mouth daily. 30 capsule 1  . ipratropium (ATROVENT) 0.06 % nasal spray Place 2 sprays into both nostrils 4 (four) times daily. (Patient taking differently: Place 2 sprays into both nostrils 4 (four) times daily as needed for rhinitis.) 15 mL 12  . omeprazole (PRILOSEC) 20 MG capsule Take 20 mg by mouth daily.     No current facility-administered medications for this visit.    REVIEW OF SYSTEMS:   Constitutional: ( - ) fevers, ( - )  chills , ( - ) night sweats Eyes: ( - ) blurriness of vision, ( - ) double vision, ( - ) watery eyes Ears, nose, mouth, throat, and face: ( - ) mucositis, ( - ) sore throat Respiratory: ( - ) cough, ( - ) dyspnea, ( - ) wheezes Cardiovascular: ( - ) palpitation, ( - ) chest discomfort, ( - ) lower extremity swelling Gastrointestinal:  ( - ) nausea, ( - ) heartburn, ( - ) change in bowel habits Skin: ( - ) abnormal skin rashes Lymphatics: ( - ) new lymphadenopathy, ( - ) easy bruising Neurological: ( - ) numbness, ( - ) tingling, ( - ) new weaknesses Behavioral/Psych: ( - ) mood change, ( - ) new changes  All other systems were reviewed with the patient and are negative.  PHYSICAL EXAMINATION: ECOG PERFORMANCE STATUS: 1 - Symptomatic but completely ambulatory  Vitals:   11/27/20 1351  BP: 140/89  Pulse: 85  Resp: 17  Temp: (!) 97.1 F (36.2 C)  SpO2: 100%   Filed Weights   11/27/20 1351  Weight: 195 lb (88.5 kg)    GENERAL: well appearing middle aged [place] American female in NAD  SKIN: skin color, texture, turgor are normal, no rashes or significant lesions EYES: conjunctiva are pink and non-injected, sclera clear LUNGS: clear to auscultation and percussion with normal breathing effort HEART: regular rate & rhythm and no murmurs and no lower extremity  edema ABDOMEN: soft, non-tender, non-distended, normal bowel sounds Musculoskeletal: no cyanosis of digits and no clubbing  PSYCH: alert & oriented x 3, fluent speech NEURO: no focal motor/sensory deficits  LABORATORY DATA:  I have reviewed the data as listed CBC Latest Ref Rng & Units 11/27/2020 11/17/2020 11/13/2020  WBC 4.0 - 10.5 K/uL 7.9 7.1 6.7  Hemoglobin 12.0 - 15.0 g/dL 8.4(L) 7.1(L) 7.0(L)  Hematocrit 36.0 - 46.0 % 32.1(L) 27.1(L) 27.5(L)  Platelets 150 - 400 K/uL 240 298 301    CMP Latest Ref Rng & Units 11/27/2020 11/13/2020 09/01/2020  Glucose 70 - 99 mg/dL 107(H) 93 98  BUN 6 - 20 mg/dL 13 11 11   Creatinine 0.44 - 1.00 mg/dL 0.97 0.81 0.83  Sodium 135 - 145 mmol/L 139 139 146(H)  Potassium 3.5 - 5.1 mmol/L 4.2 3.8 3.6  Chloride 98 - 111 mmol/L 105 107 110(H)  CO2 22 - 32 mmol/L 26 24 20   Calcium 8.9 - 10.3 mg/dL 8.8(L) 8.4(L) 8.7  Total Protein 6.5 - 8.1 g/dL 7.6 - 7.3  Total Bilirubin 0.3 - 1.2 mg/dL 0.4 - <0.2  Alkaline Phos 38 - 126 U/L 75 - 69  AST 15 - 41 U/L 15 - 13  ALT 0 - 44 U/L 8 - 9    RADIOGRAPHIC STUDIES: No results found.  ASSESSMENT & PLAN [name] 50 y.o. female with medical history significant for COPD, GERD, pneumonia, arthritis, and TIAs who presents for evaluation of longstanding iron deficiency anemia.  After review the labs, review records, discussed with the patient the findings most consistent with an iron deficiency anemia in the setting of heavy GYN bleeding.  The patient is not currently under the care of an OB/GYN and therefore would recommend that she establish with OB/GYN in order to receive appropriate management of this condition.  She has an intolerance to p.o. iron therapy and therefore I would recommend that we proceed with IV Feraheme 510 mg q. 7 days x 2 doses in order to appropriately bolster her iron levels.  The patient  has received IV iron therapy already in another setting and is currently awaiting 1 dose.  She received 1 dose  previously.  There is no need for [place] to arrange IV iron is currently underway.  We agree with this management at this time and recommend that she establish with an OB/GYN provider.  I would recommend we see her back in clinic approximately 4 to 6 weeks after her last dose of IV iron in the orders were sure was effective.  # Iron Deficiency Anemia 2/2 to GYN Bleeding -- Findings are most consistent with chronic anemia secondary to GYN bleeding. -- Patient has tried iron in the past and found that she was unable to tolerate it due to severe constipation.  She did respond to IV iron therapy in 2019. -- agree with IV Feraheme 510 mg q. 7 days x 2 doses.This has been started by another provider, she has already received 1 dose.  -- We will plan to see the patient back approximately 4 to 6 weeks after her last dose of IV iron. -- Encouraged the patient to establish care with OB/GYN in order to get her menstrual cycles under better control  Orders Placed This Encounter  Procedures  . CBC with Differential (Cancer Center Only)    Standing Status:   Future    Number of Occurrences:   1    Standing Expiration Date:   11/27/2021  . Retic Panel    Standing Status:   Future    Number of Occurrences:   1    Standing Expiration Date:   11/27/2021  . CMP ([place] only)    Standing Status:   Future    Number of Occurrences:   1    Standing Expiration Date:   11/27/2021  . Ferritin    Standing Status:   Future    Number of Occurrences:   1    Standing Expiration Date:   11/27/2021  . Iron and TIBC    Standing Status:   Future    Number of Occurrences:   1    Standing Expiration Date:   11/27/2021    All questions were answered. The patient knows to call the clinic with any problems, questions or concerns.  A total of more than 60 minutes were spent on this encounter and over half of that time was spent on counseling and coordination of care as outlined above.   Ledell Peoples, MD Department of  Hematology/Oncology [place] at [facility] Phone: [phone] Pager: [phone] Email: [email or web address]  11/30/2020 6:14 PM

## 2020-11-28 ENCOUNTER — Other Ambulatory Visit: Payer: Self-pay

## 2020-11-28 DIAGNOSIS — D509 Iron deficiency anemia, unspecified: Secondary | ICD-10-CM

## 2020-11-28 LAB — IRON AND TIBC
Iron: 49 ug/dL (ref 41–142)
Saturation Ratios: 12 % — ABNORMAL LOW (ref 21–57)
TIBC: 404 ug/dL (ref 236–444)
UIBC: 355 ug/dL (ref 120–384)

## 2020-11-28 LAB — FERRITIN: Ferritin: 107 ng/mL (ref 11–307)

## 2020-11-28 MED ORDER — FERUMOXYTOL INJECTION 510 MG/17 ML: 510.0000 mg | Freq: Once | INTRAVENOUS | 0 refills | Status: DC

## 2020-11-28 NOTE — Progress Notes (Signed)
Feraheme signed and faxed

## 2020-12-01 ENCOUNTER — Other Ambulatory Visit: Payer: Self-pay

## 2020-12-01 ENCOUNTER — Telehealth: Payer: Self-pay | Admitting: Hematology and Oncology

## 2020-12-01 ENCOUNTER — Ambulatory Visit (HOSPITAL_COMMUNITY)
Admission: RE | Admit: 2020-12-01 | Discharge: 2020-12-01 | Disposition: A | Discharge status: Home / Self Care | Payer: PRIVATE HEALTH INSURANCE | Source: Ambulatory Visit | Attending: Internal Medicine | Admitting: Internal Medicine

## 2020-12-01 DIAGNOSIS — D509 Iron deficiency anemia, unspecified: Secondary | ICD-10-CM | POA: Diagnosis not present

## 2020-12-01 MED ORDER — SODIUM CHLORIDE 0.9 % IV SOLN
INTRAVENOUS | Status: DC | PRN
  Administered 2020-12-01: 250 mL via INTRAVENOUS

## 2020-12-01 MED ORDER — SODIUM CHLORIDE 0.9 % IV SOLN
510.0000 mg | Freq: Once | INTRAVENOUS | Status: AC
  Administered 2020-12-01: 510 mg via INTRAVENOUS
  Filled 2020-12-01: qty 510

## 2020-12-01 NOTE — Discharge Instructions (Signed)
Ferumoxytol injection What is this medicine? FERUMOXYTOL is an iron complex. Iron is used to make healthy red blood cells, which carry oxygen and nutrients throughout the body. This medicine is used to treat iron deficiency anemia. This medicine may be used for other purposes; ask your health care provider or pharmacist if you have questions. COMMON BRAND NAME(S): Feraheme What should I tell my health care provider before I take this medicine? They need to know if you have any of these conditions:  anemia not caused by low iron levels  high levels of iron in the blood  magnetic resonance imaging (MRI) test scheduled  an unusual or allergic reaction to iron, other medicines, foods, dyes, or preservatives  pregnant or trying to get pregnant  breast-feeding How should I use this medicine? This medicine is for injection into a vein. It is given by a health care professional in a hospital or clinic setting. Talk to your pediatrician regarding the use of this medicine in children. Special care may be needed. Overdosage: If you think you have taken too much of this medicine contact a poison control center or emergency room at once. NOTE: This medicine is only for you. Do not share this medicine with others. What if I miss a dose? It is important not to miss your dose. Call your doctor or health care professional if you are unable to keep an appointment. What may interact with this medicine? This medicine may interact with the following medications:  other iron products This list may not describe all possible interactions. Give your health care provider a list of all the medicines, herbs, non-prescription drugs, or dietary supplements you use. Also tell them if you smoke, drink alcohol, or use illegal drugs. Some items may interact with your medicine. What should I watch for while using this medicine? Visit your doctor or healthcare professional regularly. Tell your doctor or healthcare  professional if your symptoms do not start to get better or if they get worse. You may need blood work done while you are taking this medicine. You may need to follow a special diet. Talk to your doctor. Foods that contain iron include: whole grains/cereals, dried fruits, beans, or peas, leafy green vegetables, and organ meats (liver, kidney). What side effects may I notice from receiving this medicine? Side effects that you should report to your doctor or health care professional as soon as possible:  allergic reactions like skin rash, itching or hives, swelling of the face, lips, or tongue  breathing problems  changes in blood pressure  feeling faint or lightheaded, falls  fever or chills  flushing, sweating, or hot feelings  swelling of the ankles or feet Side effects that usually do not require medical attention (report to your doctor or health care professional if they continue or are bothersome):  diarrhea  headache  nausea, vomiting  stomach pain This list may not describe all possible side effects. Call your doctor for medical advice about side effects. You may report side effects to FDA at 1-800-FDA-1088. Where should I keep my medicine? This drug is given in a hospital or clinic and will not be stored at home. NOTE: This sheet is a summary. It may not cover all possible information. If you have questions about this medicine, talk to your doctor, pharmacist, or health care provider.  2021 Elsevier/Gold Standard (2016-12-13 20:21:10)  

## 2020-12-01 NOTE — Telephone Encounter (Signed)
Left message with upcoming appointment. Gave option to call back to reschedule if needed. 

## 2020-12-01 NOTE — Progress Notes (Signed)
PATIENT CARE CENTER NOTE  Diagnosis: Iron Deficiency Anemia    Provider: Durene Fruits, NP   Procedure: Feraheme infusion    Note: Patient received second Feraheme infusion via PIV. Tolerated well with no adverse reaction. Observed patient for 30 minutes post-infusion. Vital signs stable Discharge instructions given. Patient alert, oriented and ambulatory at discharge.

## 2020-12-03 ENCOUNTER — Inpatient Hospital Stay: Payer: PRIVATE HEALTH INSURANCE | Admitting: Orthopedic Surgery

## 2020-12-04 ENCOUNTER — Ambulatory Visit (INDEPENDENT_AMBULATORY_CARE_PROVIDER_SITE_OTHER): Payer: PRIVATE HEALTH INSURANCE | Admitting: Licensed Clinical Social Worker

## 2020-12-04 ENCOUNTER — Other Ambulatory Visit: Payer: Self-pay

## 2020-12-04 DIAGNOSIS — F411 Generalized anxiety disorder: Secondary | ICD-10-CM

## 2020-12-05 ENCOUNTER — Telehealth: Payer: Self-pay | Admitting: *Deleted

## 2020-12-05 NOTE — Telephone Encounter (Signed)
-----   Message from Orson Slick, MD sent at 12/03/2020  5:55 PM EST ----- Please let [name] know that her iron labs and hgb and responding well to IV iron. Recommend she continue IV iron  (she is not getting it here, being ordered by another provider). We will see her back in March 2022 to assure her levels are stable.   ----- Message ----- From: Buel Ream, [facility] [place] Sent: 11/27/2020   3:32 PM EST To: Orson Slick, MD

## 2020-12-05 NOTE — Telephone Encounter (Signed)
TCT patient regarding lab results. No answer but was able to leave vm message for pt to return this call to review her lab results.

## 2020-12-08 NOTE — Telephone Encounter (Signed)
Received callback from patient. Reviewed recent labs with her.  She states she has gotten 2  Doses of IV iron-last on 12/01/20.she would like to know if she should be taking any oral iron. Currently she is not.  Please advise

## 2020-12-15 ENCOUNTER — Ambulatory Visit (INDEPENDENT_AMBULATORY_CARE_PROVIDER_SITE_OTHER): Payer: PRIVATE HEALTH INSURANCE | Admitting: Internal Medicine

## 2020-12-15 ENCOUNTER — Encounter: Payer: Self-pay | Admitting: Internal Medicine

## 2020-12-15 ENCOUNTER — Other Ambulatory Visit: Payer: Self-pay

## 2020-12-15 VITALS — BP 144/73 | HR 65 | Temp 98.5°F | Resp 16 | Ht 63.5 in | Wt 187.6 lb

## 2020-12-15 DIAGNOSIS — Z23 Encounter for immunization: Secondary | ICD-10-CM

## 2020-12-15 DIAGNOSIS — Z1211 Encounter for screening for malignant neoplasm of colon: Secondary | ICD-10-CM | POA: Diagnosis not present

## 2020-12-15 DIAGNOSIS — Z1231 Encounter for screening mammogram for malignant neoplasm of breast: Secondary | ICD-10-CM | POA: Diagnosis not present

## 2020-12-15 DIAGNOSIS — F411 Generalized anxiety disorder: Secondary | ICD-10-CM

## 2020-12-15 DIAGNOSIS — D509 Iron deficiency anemia, unspecified: Secondary | ICD-10-CM

## 2020-12-15 MED ORDER — FLUOXETINE HCL 20 MG PO CAPS: 20.0000 mg | ORAL_CAPSULE | Freq: Every day | ORAL | 1 refills | Status: DC

## 2020-12-15 MED ORDER — FERROUS SULFATE 325 (65 FE) MG PO TABS: 325.0000 mg | ORAL_TABLET | ORAL | 0 refills | Status: DC

## 2020-12-15 NOTE — BH Specialist Note (Signed)
Follow up call placed to patient. Patient shared that the scheduled surgery was cancelled due to low hemoglobin levels. She is participating in infusions and will discuss with specialists and PCP about treatment plan moving forward. Pt states mental health symptoms are managed well at this time, noting she has strong support from family. No additional concerns noted.  Pt agreed to schedule follow up call on 12/18/20

## 2020-12-15 NOTE — Progress Notes (Signed)
Follow-up on low Hgb, patient not taking iron tablets, reports was unaware they were prescribed, reports ice cravings have stopped  -Tdap today

## 2020-12-15 NOTE — Patient Instructions (Signed)
Calm and Headspace Apps as resources for anxiety and stress management.

## 2020-12-15 NOTE — Progress Notes (Signed)
Subjective:    [name] - 50 y.o. female MRN [id number]  Date of birth: 11/07/1971  HPI  [name] is here for IDA f/u.  Has had feraheme infusion x2, most recently on 1/24. Not currently taking Fe supplements. Was not aware they were prescribed. Has taken them before but has suffered from constipation.   Reports that her anxiety is through the roof. This has been going on for several weeks. She is constantly thinking about things. Feels like her stomach is in knots. She has had trouble with anxiety in the past but reports she didn't recognize that it was anxiety until she established with [facility]. Has been taking Prozac 10 mg, increased herself to 20 mg within the past week. Would like an appointment with Christa See, LCSW. Reports when she is alone is when her anxiety is the worst.    GAD 7 : Generalized Anxiety Score 12/15/2020 11/11/2020 10/13/2020 09/16/2020  Nervous, Anxious, on Edge 1 0 1 2  Control/stop worrying 1 1 1 2   Worry too much - different things 1 1 1 1   Trouble relaxing 0 0 0 1  Restless 0 0 0 0  Easily annoyed or irritable 1 0 1 1  Afraid - awful might happen 0 0 0 0  Total GAD 7 Score 4 2 4 7   Anxiety Difficulty Somewhat difficult - - -     Health Maintenance:  Health Maintenance Due  Topic Date Due  . TETANUS/TDAP  Never done  . COLONOSCOPY (Pts 45-2yrs Insurance coverage will need to be confirmed)  Never done  . MAMMOGRAM  Never done  . PAP SMEAR-Modifier  12/29/2020    -  reports that she quit smoking about 5 years ago. Her smoking use included cigarettes. She has a 13.00 pack-year smoking history. She has quit using smokeless tobacco. - Review of Systems: Per HPI. - Past Medical History: Patient Active Problem List   Diagnosis Date Noted  . Morning headache 06/02/2017  . Leiomyoma of body of uterus 12/24/2016  . Acute bronchitis 11/11/2016  . Prediabetes 11/11/2016  . Pap smear abnormality of cervix with ASCUS favoring benign 01/01/2016  . COPD  (chronic obstructive pulmonary disease) ([place]) 11/18/2015  . GERD (gastroesophageal reflux disease) 11/18/2015  . Abnormal imaging of thyroid 11/04/2015  . Elevated d-dimer 11/01/2015  . IDA (iron deficiency anemia) 09/04/2015  . 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 S/P cervical spinal fusion 03/06/2014  . Obesity 02/22/2014  . Radicular pain in right arm 01/11/2014  . 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 Other and unspecified hyperlipidemia 08/24/2013  . History of CVA (cerebrovascular accident) 05/18/2013  . 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    Appearance: She is not diaphoretic.  HENT:     Head: Normocephalic and atraumatic.  Eyes:     Extraocular Movements: EOM normal.     Conjunctiva/sclera: Conjunctivae normal.  Cardiovascular:     Rate and Rhythm: Normal rate and regular rhythm.     Heart sounds: Normal  heart sounds. No murmur heard.   Pulmonary:     Effort: Pulmonary effort is normal. No respiratory distress.     Breath sounds: Normal breath sounds.  Musculoskeletal:        General: Normal range of motion.  Skin:    General: Skin is warm and dry.  Neurological:     Mental Status: She is alert and oriented to person, place, and time.  Psychiatric:        Mood and Affect: Affect normal.        Judgment: Judgment normal.     Comments: Appropriately tearful            Assessment & Plan:   1. Iron deficiency anemia, unspecified iron deficiency anemia type Last HgB 8.4 with Fe level of 49 on Jan 20 prior to second feraheme. Start Po Fe every other day to continue to build stores and  hopefully avoid side effect of constipation. Dicussed can use Miralax prn. Will plan to repeat labs at next office visit to monitor. Patient is asymptomatic.  - ferrous sulfate 325 (65 FE) MG tablet; Take 1 tablet (325 mg total) by mouth every other day.  Dispense: 90 tablet; Refill: 0  2. Need for Tdap vaccination - Tdap vaccine greater than or equal to 7yo IM  3. Encounter for screening mammogram for malignant neoplasm of breast - MM DIGITAL SCREENING BILATERAL; Future  4. Screening for colon cancer - Ambulatory referral to Gastroenterology  5. Generalized anxiety disorder Continue Prozac 20 mg. Follow up visit with Christa See, LCSW this week. Discussed coping strategies at home including breathing exercises and meditation. Recommend trying app such as Calm or Headspace. Follow up in 6 weeks for medication adjustment. No safety concerns.  - FLUoxetine (PROZAC) 20 MG capsule; Take 1 capsule (20 mg total) by mouth daily.  Dispense: 90 capsule; Refill: [place], D.O. 12/15/2020, 10:03 AM Primary Care at [facility]

## 2020-12-15 NOTE — Addendum Note (Signed)
Addended by: Eddie Dibbles on: 12/15/2020 11:57 AM   Modules accepted: Orders

## 2020-12-16 LAB — IRON,TIBC AND FERRITIN PANEL
Ferritin: 112 ng/mL (ref 15–150)
Iron Saturation: 23 % (ref 15–55)
Iron: 73 ug/dL (ref 27–159)
Total Iron Binding Capacity: 318 ug/dL (ref 250–450)
UIBC: 245 ug/dL (ref 131–425)

## 2020-12-16 LAB — CBC WITH DIFFERENTIAL/PLATELET
Basophils Absolute: 0 10*3/uL (ref 0.0–0.2)
Basos: 1 %
EOS (ABSOLUTE): 0 10*3/uL (ref 0.0–0.4)
Eos: 1 %
Hematocrit: 40.5 % (ref 34.0–46.6)
Hemoglobin: 12.4 g/dL (ref 11.1–15.9)
Immature Grans (Abs): 0 10*3/uL (ref 0.0–0.1)
Immature Granulocytes: 0 %
Lymphocytes Absolute: 2.4 10*3/uL (ref 0.7–3.1)
Lymphs: 45 %
MCH: 23 pg — ABNORMAL LOW (ref 26.6–33.0)
MCHC: 30.6 g/dL — ABNORMAL LOW (ref 31.5–35.7)
MCV: 75 fL — ABNORMAL LOW (ref 79–97)
Monocytes Absolute: 0.3 10*3/uL (ref 0.1–0.9)
Monocytes: 6 %
Neutrophils Absolute: 2.5 10*3/uL (ref 1.4–7.0)
Neutrophils: 47 %
Platelets: 308 10*3/uL (ref 150–450)
RBC: 5.38 x10E6/uL — ABNORMAL HIGH (ref 3.77–5.28)
WBC: 5.3 10*3/uL (ref 3.4–10.8)

## 2020-12-17 ENCOUNTER — Other Ambulatory Visit: Payer: Self-pay

## 2020-12-18 ENCOUNTER — Telehealth: Payer: Self-pay | Admitting: Licensed Clinical Social Worker

## 2020-12-18 ENCOUNTER — Institutional Professional Consult (permissible substitution): Payer: PRIVATE HEALTH INSURANCE | Admitting: Licensed Clinical Social Worker

## 2020-12-18 NOTE — Telephone Encounter (Signed)
Call placed to patient regarding scheduled IBH. LCSW left message requesting return call.

## 2020-12-23 ENCOUNTER — Telehealth: Payer: PRIVATE HEALTH INSURANCE | Admitting: Physician Assistant

## 2020-12-23 DIAGNOSIS — Z20822 Contact with and (suspected) exposure to covid-19: Secondary | ICD-10-CM

## 2020-12-23 DIAGNOSIS — J069 Acute upper respiratory infection, unspecified: Secondary | ICD-10-CM

## 2020-12-23 DIAGNOSIS — R059 Cough, unspecified: Secondary | ICD-10-CM | POA: Diagnosis not present

## 2020-12-23 LAB — POCT INFLUENZA A/B
Influenza A, POC: NEGATIVE
Influenza B, POC: NEGATIVE

## 2020-12-23 LAB — POC COVID19 BINAXNOW: SARS Coronavirus 2 Ag: NEGATIVE

## 2020-12-23 MED ORDER — PREDNISONE 20 MG PO TABS: ORAL_TABLET | ORAL | 0 refills | Status: AC

## 2020-12-23 MED ORDER — BENZONATATE 100 MG PO CAPS: 100.0000 mg | ORAL_CAPSULE | Freq: Two times a day (BID) | ORAL | 0 refills | Status: DC | PRN

## 2020-12-23 MED ORDER — AZITHROMYCIN 250 MG PO TABS: ORAL_TABLET | ORAL | 0 refills | Status: DC

## 2020-12-23 NOTE — Patient Instructions (Signed)
Your rapid COVID test was negative.  I encourage you to monitor your temperature, I sent a prescription for a steroid taper, azithromycin, and Tessalon Perles for cough to your pharmacy.  I encourage you to use ibuprofen as needed for your headaches and body aches, make sure you are staying very well-hydrated.   I hope that you feel better soon, please let us know if there is any else we can do for you   Kennieth Rad, PA-C Physician Assistant [facility] [email or web address]    Upper Respiratory Infection, Adult An upper respiratory infection (URI) is a common viral infection of the nose, throat, and upper air passages that lead to the lungs. The most common type of URI is the common cold. URIs usually get better on their own, without medical treatment. What are the causes? A URI is caused by a virus. You may catch a virus by:  Breathing in droplets from an infected person's cough or sneeze.  Touching something that has been exposed to the virus (contaminated) and then touching your mouth, nose, or eyes. What increases the risk? You are more likely to get a URI if:  You are very young or very old.  It is autumn or winter.  You have close contact with others, such as at a daycare, school, or health care facility.  You smoke.  You have long-term (chronic) heart or lung disease.  You have a weakened disease-fighting (immune) system.  You have nasal allergies or asthma.  You are experiencing a lot of stress.  You work in an area that has poor air circulation.  You have poor nutrition. What are the signs or symptoms? A URI usually involves some of the following symptoms:  Runny or stuffy (congested) nose.  Sneezing.  Cough.  Sore throat.  Headache.  Fatigue.  Fever.  Loss of appetite.  Pain in your forehead, behind your eyes, and over your cheekbones (sinus pain).  Muscle aches.  Redness or  irritation of the eyes.  Pressure in the ears or face. How is this diagnosed? This condition may be diagnosed based on your medical history and symptoms, and a physical exam. Your health care provider may use a cotton swab to take a mucus sample from your nose (nasal swab). This sample can be tested to determine what virus is causing the illness. How is this treated? URIs usually get better on their own within 7-10 days. You can take steps at home to relieve your symptoms. Medicines cannot cure URIs, but your health care provider may recommend certain medicines to help relieve symptoms, such as:  Over-the-counter cold medicines.  Cough suppressants. Coughing is a type of defense against infection that helps to clear the respiratory system, so take these medicines only as recommended by your health care provider.  Fever-reducing medicines. Follow these instructions at home: Activity  Rest as needed.  If you have a fever, stay home from work or school until your fever is gone or until your health care provider says you are no longer contagious. Your health care provider may have you wear a face mask to prevent your infection from spreading. Relieving symptoms  Gargle with a salt-water mixture 3-4 times a day or as needed. To make a salt-water mixture, completely dissolve -1 tsp of salt in 1 cup of warm water.  Use a cool-mist humidifier to add moisture to the air. This can help you breathe more easily. Eating and drinking  Drink enough fluid to keep your urine  pale yellow.  Eat soups and other clear broths.   General instructions  Take over-the-counter and prescription medicines only as told by your health care provider. These include cold medicines, fever reducers, and cough suppressants.  Do not use any products that contain nicotine or tobacco, such as cigarettes and e-cigarettes. If you need help quitting, ask your health care provider.  Stay away from secondhand smoke.  Stay  up to date on all immunizations, including the yearly (annual) flu vaccine.  Keep all follow-up visits as told by your health care provider. This is important.   How to prevent the spread of infection to others  URIs can be passed from person to person (are contagious). To prevent the infection from spreading: ? Wash your hands often with soap and water. If soap and water are not available, use hand sanitizer. ? Avoid touching your mouth, face, eyes, or nose. ? Cough or sneeze into a tissue or your sleeve or elbow instead of into your hand or into the air.   Contact a health care provider if:  You are getting worse instead of better.  You have a fever or chills.  Your mucus is brown or red.  You have yellow or brown discharge coming from your nose.  You have pain in your face, especially when you bend forward.  You have swollen neck glands.  You have pain while swallowing.  You have white areas in the back of your throat. Get help right away if:  You have shortness of breath that gets worse.  You have severe or persistent: ? Headache. ? Ear pain. ? Sinus pain. ? Chest pain.  You have chronic lung disease along with any of the following: ? Wheezing. ? Prolonged cough. ? Coughing up blood. ? A change in your usual mucus.  You have a stiff neck.  You have changes in your: ? Vision. ? Hearing. ? Thinking. ? Mood. Summary  An upper respiratory infection (URI) is a common infection of the nose, throat, and upper air passages that lead to the lungs.  A URI is caused by a virus.  URIs usually get better on their own within 7-10 days.  Medicines cannot cure URIs, but your health care provider may recommend certain medicines to help relieve symptoms. This information is not intended to replace advice given to you by your health care provider. Make sure you discuss any questions you have with your health care provider. Document Revised: 07/03/2020 Document Reviewed:  07/03/2020 Elsevier Patient Education  [place].

## 2020-12-23 NOTE — Progress Notes (Signed)
Established Patient Office Visit  Subjective:  Patient ID: [name], female    DOB: 1971/03/03  Age: 50 y.o. MRN: [id number]  CC:  Chief Complaint  Patient presents with  . URI   Virtual Visit via Telephone Note  I connected with [name] on 12/23/20 at 11:10 AM EST by telephone and verified that I am speaking with the correct person using two identifiers.  Location: Patient: Home /drove to site for rapid Covid test Provider: [facility]    I discussed the limitations, risks, security and privacy concerns of performing an evaluation and management service by telephone and the availability of in person appointments. I also discussed with the patient that there may be a patient responsible charge related to this service. The patient expressed understanding and agreed to proceed.   History of Present Illness:  [name] states that she has been having a dry cough, wheezing, sore throat, chills, unmeasured fever, body aches, right-sided headache, low appetite since Friday, December 19, 2020.  Denies nausea or vomiting, but does endorse an episode of diarrhea today.  Reports she is drinking a lot of juice.  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  Denies sick contacts.  Has been using Tylenol Sinus and cold without much relief   Reports cough is keeping her awake at night.  Reports 3 COVID vaccines with booster in November 2021.   Observations/Objective: Medical history and current medications reviewed, no physical exam completed      Past Medical History:  Diagnosis Date  . Anemia   . Anxiety   . Arthritis    right knee, back  . COPD (chronic obstructive pulmonary disease) ([place])   . GERD (gastroesophageal reflux disease)   . Headache(784.0)   . History of blood transfusion    Hx; of in 1991 after delivery  . History of TIAs   . Numbness    Right  hand  . Pneumonia   . Stroke [facility]) 05/2013    Past Surgical History:  Procedure Laterality Date  . ANTERIOR CERVICAL DECOMP/DISCECTOMY FUSION N/A 03/06/2014   Procedure: ANTERIOR CERVICAL DECOMPRESSION/DISCECTOMY FUSION 2 LEVELS four/five, five/six;  Surgeon: Eustace Moore, MD;  Location: [facility] NEURO ORS;  Service: Neurosurgery;  Laterality: N/A;  . CESAREAN SECTION     x 1 with 5th pregnancy  . CHOLECYSTECTOMY    . KNEE ARTHROSCOPY Right 2015  . TEE WITHOUT CARDIOVERSION N/A 05/21/2013   Procedure: TRANSESOPHAGEAL ECHOCARDIOGRAM (TEE);  Surgeon: Sueanne Margarita, MD;  Location: [facility] ENDOSCOPY;  Service: Cardiovascular;  Laterality: N/A;  . TUBAL LIGATION      Family History  Problem Relation Age of Onset  . Renal Disease Father        dialysis  . Hypertension Father   . Heart disease Mother   . Heart disease Maternal Grandfather   . Asthma Sister   . Asthma Maternal Aunt     Social History   Socioeconomic History  . Marital status: Divorced    Spouse name: Not on file  . Number of children: 7  . Years of education: GEd  . Highest education level: Not on file  Occupational History  . Occupation: CNA    Employer: [employer]  Tobacco Use  . Smoking status: Former Smoker    Packs/day: 0.50    Years: 26.00    Pack years: 13.00    Types:  Cigarettes    Quit date: 10/09/2015    Years since quitting: 5.2  . Smokeless tobacco: Former Network engineer  . Vaping Use: Never used  Substance and Sexual Activity  . Alcohol use: Yes    Alcohol/week: 0.0 standard drinks    Comment: occ  . Drug use: No  . Sexual activity: Not on file  Other Topics Concern  . Not on file  Social History Narrative   Patient lives at home with her family    She has 7 children   5 grown and out of the house   2 at home 45 and 61   She works as a Quarry manager at [employer] Strain: Not on [employer] Insecurity: Not on file  Transportation Needs:  Not on file  Physical Activity: Not on file  Stress: Not on file  Social Connections: Not on file  Intimate Partner Violence: Not on file    Outpatient Medications Prior to Visit  Medication Sig Dispense Refill  . aspirin-acetaminophen-caffeine (EXCEDRIN MIGRAINE) 250-250-65 MG tablet Take 2 tablets by mouth 2 (two) times daily as needed for headache.    . ferrous sulfate 325 (65 FE) MG tablet Take 1 tablet (325 mg total) by mouth every other day. 90 tablet 0  . ferumoxytol (FERAHEME) 510 MG/17ML SOLN injection Inject 17 mLs (510 mg total) into the vein once for 1 dose. 17 mL 0  . FLUoxetine (PROZAC) 20 MG capsule Take 1 capsule (20 mg total) by mouth daily. 90 capsule 1  . omeprazole (PRILOSEC) 20 MG capsule Take 20 mg by mouth daily.    Marland Kitchen albuterol (PROVENTIL) (2.5 MG/3ML) 0.083% nebulizer solution Take 3 mLs (2.5 mg total) by nebulization every 6 (six) hours as needed for wheezing or shortness of breath. 150 mL 1  . ipratropium (ATROVENT) 0.06 % nasal spray Place 2 sprays into both nostrils 4 (four) times daily. (Patient not taking: Reported on 12/23/2020) 15 mL 12   No facility-administered medications prior to visit.    Allergies  Allergen Reactions  . Shellfish Allergy Itching and Swelling  . Amoxicillin Other (See Comments)    Yeast infection  . Latex Itching and Rash    ROS Review of Systems  Constitutional: Positive for chills and fatigue.  HENT: Positive for sinus pressure, sore throat and trouble swallowing.   Eyes: Negative.   Respiratory: Positive for cough and wheezing.   Cardiovascular: Negative for chest pain.  Gastrointestinal: Positive for diarrhea. Negative for abdominal pain, nausea and vomiting.  Endocrine: Negative.   Genitourinary: Negative.   Musculoskeletal: Positive for myalgias.  Allergic/Immunologic: Negative.   Neurological: Positive for headaches.  Hematological: Negative.   Psychiatric/Behavioral: Negative.       Objective:     There were  no vitals taken for this visit. Wt Readings from Last 3 Encounters:  12/15/20 187 lb 9.6 oz (85.1 kg)  11/27/20 195 lb (88.5 kg)  11/17/20 187 lb 6.4 oz (85 kg)     Health Maintenance Due  Topic Date Due  . COLONOSCOPY (Pts 45-59yrs Insurance coverage will need to be confirmed)  Never done  . MAMMOGRAM  Never done  . PAP SMEAR-Modifier  12/29/2020    There are no preventive care reminders to display for this patient.  Lab Results  Component Value Date   TSH 1.040 11/01/2015   Lab Results  Component Value Date   WBC 5.3 12/15/2020   HGB 12.4  12/15/2020   HCT 40.5 12/15/2020   MCV 75 (L) 12/15/2020   PLT 308 12/15/2020   Lab Results  Component Value Date   NA 139 11/27/2020   K 4.2 11/27/2020   CO2 26 11/27/2020   GLUCOSE 107 (H) 11/27/2020   BUN 13 11/27/2020   CREATININE 0.97 11/27/2020   BILITOT 0.4 11/27/2020   ALKPHOS 75 11/27/2020   AST 15 11/27/2020   ALT 8 11/27/2020   PROT 7.6 11/27/2020   ALBUMIN 3.9 11/27/2020   CALCIUM 8.8 (L) 11/27/2020   ANIONGAP 8 11/27/2020   Lab Results  Component Value Date   CHOL 212 (H) 02/12/2020   Lab Results  Component Value Date   HDL 69 02/12/2020   Lab Results  Component Value Date   LDLCALC 121 (H) 02/12/2020   Lab Results  Component Value Date   TRIG 111 02/12/2020   Lab Results  Component Value Date   CHOLHDL 3.1 02/12/2020   Lab Results  Component Value Date   HGBA1C 5.4 02/12/2020      Assessment & Plan:   Problem List Items Addressed This Visit   None   Visit Diagnoses    Upper respiratory tract infection, unspecified type    -  Primary   Relevant Medications   azithromycin (ZITHROMAX) 250 MG tablet   predniSONE (DELTASONE) 20 MG tablet   Other Relevant Orders   POC COVID-19 (Completed)   Cough       Relevant Medications   benzonatate (TESSALON) 100 MG capsule   Other Relevant Orders   Influenza A/B     Assessment and Plan: 1. Upper respiratory tract infection, unspecified  type Rapid Covid test was negative.  Trial azithromycin, prednisone taper, Tessalon Perles.  Encouraged hydration, rest.  Red flags given for prompt reevaluation. - azithromycin (ZITHROMAX) 250 MG tablet; Take 2 tabs PO day 1, then take 1 tab PO once daily  Dispense: 6 tablet; Refill: 0 - predniSONE (DELTASONE) 20 MG tablet; Take 3 tablets (60 mg total) by mouth daily with breakfast for 2 days, THEN 2 tablets (40 mg total) daily with breakfast for 2 days, THEN 1 tablet (20 mg total) daily with breakfast for 2 days, THEN 0.5 tablets (10 mg total) daily with breakfast for 2 days.  Dispense: 13 tablet; Refill: 0 - POC COVID-19  2. Cough  - benzonatate (TESSALON) 100 MG capsule; Take 1 capsule (100 mg total) by mouth 2 (two) times daily as needed for cough.  Dispense: 20 capsule; Refill: 0 - Influenza A/B   Follow Up Instructions:    I discussed the assessment and treatment plan with the patient. The patient was provided an opportunity to ask questions and all were answered. The patient agreed with the plan and demonstrated an understanding of the instructions.   The patient was advised to call back or seek an in-person evaluation if the symptoms worsen or if the condition fails to improve as anticipated.  I provided 21 minutes of non-face-to-face time during this encounter.    Meds ordered this encounter  Medications  . azithromycin (ZITHROMAX) 250 MG tablet    Sig: Take 2 tabs PO day 1, then take 1 tab PO once daily    Dispense:  6 tablet    Refill:  0    Order Specific Question:   Supervising Provider    Answer:   Asencion Noble E [[id number]]  . predniSONE (DELTASONE) 20 MG tablet    Sig: Take 3 tablets (60 mg total) by mouth  daily with breakfast for 2 days, THEN 2 tablets (40 mg total) daily with breakfast for 2 days, THEN 1 tablet (20 mg total) daily with breakfast for 2 days, THEN 0.5 tablets (10 mg total) daily with breakfast for 2 days.    Dispense:  13 tablet    Refill:  0     Order Specific Question:   Supervising Provider    Answer:   Joya Gaskins, PATRICK E [[id number]]  . benzonatate (TESSALON) 100 MG capsule    Sig: Take 1 capsule (100 mg total) by mouth 2 (two) times daily as needed for cough.    Dispense:  20 capsule    Refill:  0    Order Specific Question:   Supervising Provider    Answer:   Elsie Stain [[id number]]    Follow-up: Return if symptoms worsen or fail to improve.    Dawn Grip Danna Casella, PA-C

## 2020-12-23 NOTE — Progress Notes (Signed)
Patient verified DOB Patient complains of URI symptoms beginning Friday. Cough was first symptom. Patient denies N/V. Diarrhea episode occurred this morning HA present on the right side. Patient denies loss of taste and smell. Patient has low appetite. Patient complains of sore throat. Patient has taken tylenol sinus and cold which provided no relief.

## 2020-12-30 NOTE — Pre-Procedure Instructions (Signed)
Surgical Instructions:    Your procedure is scheduled on Tuesday 01/06/21 (07:30 AM- 10:34 AM).  Report to [facility] Main Entrance "A" at 05:30 A.M., then check in with the Admitting office.  Call this number if you have problems the morning of surgery:  [phone]   If you have any questions prior to your surgery date call [phone]: Open Monday-Friday 8am-4pm.    Remember:  Do not eat after midnight the night before your surgery.  You may drink clear liquids until 04:30 AM the morning of your surgery.   Clear liquids allowed are: Water, Non-Citrus Juices (without pulp), Carbonated Beverages, Clear Tea, Black Coffee Only, and Gatorade.   Enhanced Recovery after Surgery for Orthopedics Enhanced Recovery after Surgery is a protocol used to improve the stress on your body and your recovery after surgery.  Patient Instructions  . The night before surgery:  o No food after midnight. ONLY clear liquids after midnight   . The day of surgery (if you do NOT have diabetes):  o Drink ONE (1) Pre-Surgery Clear Ensure by 04:30 AM the morning of surgery   o This drink was given to you during your hospital pre-op appointment visit. o Nothing else to drink after completing the Pre-Surgery Clear Ensure.         If you have questions, please contact your surgeon's office.     Take these medicines the morning of surgery with A SIP OF WATER: FLUoxetine (PROZAC) omeprazole (PRILOSEC)   As of today, STOP taking any Aspirin (unless otherwise instructed by your surgeon), aspirin-acetaminophen-caffeine (EXCEDRIN MIGRAINE), Aleve, Naproxen, Ibuprofen, Motrin, Advil, Goody's, BC's, all herbal medications, fish oil, and all vitamins.          The Morning of Surgery:            Do not wear jewelry, make up, or nail polish.            Do not wear lotions, powders, perfumes, or deodorant.            Do not shave 48 hours prior to surgery.              Do not bring valuables to the hospital.             [facility] is not responsible for any belongings or valuables.  Do NOT Smoke (Tobacco/Vaping) or drink Alcohol 24 hours prior to your procedure.  If you use a CPAP at night, you may bring all equipment for your overnight stay.   Contacts, glasses, dentures or bridgework may not be worn into surgery, please bring cases for these belongings   For patients admitted to the hospital, discharge time will be determined by your treatment team.   Patients discharged the day of surgery will not be allowed to drive home, and someone needs to stay with them for 24 hours.    Special instructions:   [place]- Preparing For Surgery  Before surgery, you can play an important role. Because skin is not sterile, your skin needs to be as free of germs as possible. You can reduce the number of germs on your skin by washing with CHG (chlorahexidine gluconate) Soap before surgery.  CHG is an antiseptic cleaner which kills germs and bonds with the skin to continue killing germs even after washing.    Oral Hygiene is also important to reduce your risk of infection.  Remember - BRUSH YOUR TEETH THE MORNING OF SURGERY WITH YOUR REGULAR TOOTHPASTE  Please do not  use if you have an allergy to CHG or antibacterial soaps. If your skin becomes reddened/irritated stop using the CHG.  Do not shave (including legs and underarms) for at least 48 hours prior to first CHG shower. It is OK to shave your face.  Please follow these instructions carefully.   1. Shower the NIGHT BEFORE SURGERY and the MORNING OF SURGERY  2. If you chose to wash your hair, wash your hair first as usual with your normal shampoo.  3. After you shampoo, rinse your hair and body thoroughly to remove the shampoo.  4. Wash Face and genitals (private parts) with your normal soap.   5.  Shower the NIGHT BEFORE SURGERY and the MORNING OF SURGERY with CHG Soap.   6. Use CHG Soap as you would any other liquid soap. You can apply CHG  directly to the skin and wash gently with a scrungie or a clean washcloth.   7. Apply the CHG Soap to your body ONLY FROM THE NECK DOWN.  Do not use on open wounds or open sores. Avoid contact with your eyes, ears, mouth and genitals (private parts). Wash Face and genitals (private parts)  with your normal soap.   8. Wash thoroughly, paying special attention to the area where your surgery will be performed.  9. Thoroughly rinse your body with warm water from the neck down.  10. DO NOT shower/wash with your normal soap after using and rinsing off the CHG Soap.  11. Pat yourself dry with a CLEAN TOWEL.  12. Wear CLEAN PAJAMAS to bed the night before surgery  13. Place CLEAN SHEETS on your bed the night before your surgery  14. DO NOT SLEEP WITH PETS.   Day of Surgery: SHOWER with CHG. Wear Clean/Comfortable clothing the morning of surgery Do not apply any deodorants/lotions.   Remember to brush your teeth WITH YOUR REGULAR TOOTHPASTE.   Please read over the following fact sheets that you were given.

## 2020-12-31 ENCOUNTER — Other Ambulatory Visit: Payer: Self-pay

## 2020-12-31 ENCOUNTER — Encounter (HOSPITAL_COMMUNITY)
Admission: RE | Admit: 2020-12-31 | Discharge: 2020-12-31 | Disposition: A | Discharge status: Home / Self Care | Payer: PRIVATE HEALTH INSURANCE | Source: Ambulatory Visit | Attending: Orthopedic Surgery | Admitting: Orthopedic Surgery

## 2020-12-31 ENCOUNTER — Encounter (HOSPITAL_COMMUNITY): Payer: Self-pay

## 2020-12-31 DIAGNOSIS — Z01812 Encounter for preprocedural laboratory examination: Secondary | ICD-10-CM | POA: Insufficient documentation

## 2020-12-31 LAB — URINALYSIS, ROUTINE W REFLEX MICROSCOPIC
Bilirubin Urine: NEGATIVE
Glucose, UA: NEGATIVE mg/dL
Hgb urine dipstick: NEGATIVE
Ketones, ur: NEGATIVE mg/dL
Leukocytes,Ua: NEGATIVE
Nitrite: NEGATIVE
Protein, ur: NEGATIVE mg/dL
Specific Gravity, Urine: 1.018 (ref 1.005–1.030)
pH: 5 (ref 5.0–8.0)

## 2020-12-31 LAB — BASIC METABOLIC PANEL
Anion gap: 9 (ref 5–15)
BUN: 12 mg/dL (ref 6–20)
CO2: 27 mmol/L (ref 22–32)
Calcium: 8.6 mg/dL — ABNORMAL LOW (ref 8.9–10.3)
Chloride: 106 mmol/L (ref 98–111)
Creatinine, Ser: 0.8 mg/dL (ref 0.44–1.00)
GFR, Estimated: >60 mL/min (ref >60)
Glucose, Bld: 118 mg/dL — ABNORMAL HIGH (ref 70–99)
Potassium: 3.3 mmol/L — ABNORMAL LOW (ref 3.5–5.1)
Sodium: 142 mmol/L (ref 135–145)

## 2020-12-31 LAB — CBC
HCT: 41 % (ref 36.0–46.0)
Hemoglobin: 12.4 g/dL (ref 12.0–15.0)
MCH: 24.8 pg — ABNORMAL LOW (ref 26.0–34.0)
MCHC: 30.2 g/dL (ref 30.0–36.0)
MCV: 82.2 fL (ref 80.0–100.0)
Platelets: 236 10*3/uL (ref 150–400)
RBC: 4.99 MIL/uL (ref 3.87–5.11)
WBC: 7.3 10*3/uL (ref 4.0–10.5)
nRBC: 0 % (ref 0.0–0.2)

## 2020-12-31 LAB — SURGICAL PCR SCREEN
MRSA, PCR: NEGATIVE
Staphylococcus aureus: POSITIVE — AB

## 2020-12-31 NOTE — Progress Notes (Signed)
PCP - Phill Myron Cardiologist -   ERAS Protcol - yes PRE-SURGERY Ensure or G2- ensure  COVID TEST- 2.25.22   Anesthesia review: n/a  Patient denies shortness of breath, fever, cough and chest pain at PAT appointment   All instructions explained to the patient, with a verbal understanding of the material. Patient agrees to go over the instructions while at home for a better understanding. Patient also instructed to self quarantine after being tested for COVID-19. The opportunity to ask questions was provided.

## 2021-01-01 LAB — URINE CULTURE: Culture: NO GROWTH

## 2021-01-02 ENCOUNTER — Other Ambulatory Visit (HOSPITAL_COMMUNITY)
Admission: RE | Admit: 2021-01-02 | Discharge: 2021-01-02 | Disposition: A | Discharge status: Home / Self Care | Payer: PRIVATE HEALTH INSURANCE | Source: Ambulatory Visit | Attending: Orthopedic Surgery | Admitting: Orthopedic Surgery

## 2021-01-02 DIAGNOSIS — Z20822 Contact with and (suspected) exposure to covid-19: Secondary | ICD-10-CM | POA: Insufficient documentation

## 2021-01-02 DIAGNOSIS — Z01812 Encounter for preprocedural laboratory examination: Secondary | ICD-10-CM | POA: Insufficient documentation

## 2021-01-02 LAB — SARS CORONAVIRUS 2 (TAT 6-24 HRS): SARS Coronavirus 2: NEGATIVE

## 2021-01-05 MED ORDER — TRANEXAMIC ACID 1000 MG/10ML IV SOLN
2000.0000 mg | INTRAVENOUS | Status: AC
  Administered 2021-01-06: 2000 mg via TOPICAL
  Filled 2021-01-05: qty 20

## 2021-01-05 NOTE — Anesthesia Preprocedure Evaluation (Addendum)
Anesthesia Evaluation  Patient identified by MRN, date of birth, ID band Patient awake    Reviewed: Allergy & Precautions, NPO status , Patient's Chart, lab work & pertinent test results  History of Anesthesia Complications Negative for: history of anesthetic complications  Airway Mallampati: II  TM Distance: >3 FB Neck ROM: Full    Dental  (+) Partial Lower, Partial Upper   Pulmonary COPD, former smoker,    Pulmonary exam normal        Cardiovascular negative cardio ROS Normal cardiovascular exam     Neuro/Psych negative neurological ROS  negative psych ROS   GI/Hepatic Neg liver ROS, GERD  Medicated and Controlled,  Endo/Other  negative endocrine ROS  Renal/GU negative Renal ROS  negative genitourinary   Musculoskeletal  (+) Arthritis ,   Abdominal   Peds  Hematology   Anesthesia Other Findings Day of surgery medications reviewed with patient.  Reproductive/Obstetrics negative OB ROS                            Anesthesia Physical Anesthesia Plan  ASA: II  Anesthesia Plan: Spinal   Post-op Pain Management:  Regional for Post-op pain   Induction:   PONV Risk Score and Plan: 3 and Treatment may vary due to age or medical condition, Ondansetron, Dexamethasone and Midazolam  Airway Management Planned: Natural Airway and Simple Face Mask  Additional Equipment: None  Intra-op Plan:   Post-operative Plan:   Informed Consent: I have reviewed the patients History and Physical, chart, labs and discussed the procedure including the risks, benefits and alternatives for the proposed anesthesia with the patient or authorized representative who has indicated his/her understanding and acceptance.       Plan Discussed with: CRNA  Anesthesia Plan Comments:        Anesthesia Quick Evaluation

## 2021-01-06 ENCOUNTER — Encounter (HOSPITAL_COMMUNITY): Payer: Self-pay | Admitting: Orthopedic Surgery

## 2021-01-06 ENCOUNTER — Observation Stay (HOSPITAL_COMMUNITY)
Admission: RE | Admit: 2021-01-06 | Discharge: 2021-01-07 | Disposition: A | Discharge status: Home / Self Care | Payer: PRIVATE HEALTH INSURANCE | Source: Ambulatory Visit | Attending: Orthopedic Surgery | Admitting: Orthopedic Surgery

## 2021-01-06 ENCOUNTER — Other Ambulatory Visit: Payer: Self-pay

## 2021-01-06 ENCOUNTER — Encounter (HOSPITAL_COMMUNITY)
Admission: RE | Disposition: A | Discharge status: Home / Self Care | Payer: Self-pay | Source: Ambulatory Visit | Attending: Orthopedic Surgery

## 2021-01-06 ENCOUNTER — Ambulatory Visit (HOSPITAL_COMMUNITY): Payer: PRIVATE HEALTH INSURANCE | Admitting: Anesthesiology

## 2021-01-06 DIAGNOSIS — M1711 Unilateral primary osteoarthritis, right knee: Secondary | ICD-10-CM | POA: Diagnosis not present

## 2021-01-06 DIAGNOSIS — Z91040 Latex allergy status: Secondary | ICD-10-CM | POA: Diagnosis not present

## 2021-01-06 DIAGNOSIS — Z96659 Presence of unspecified artificial knee joint: Secondary | ICD-10-CM

## 2021-01-06 DIAGNOSIS — Z87891 Personal history of nicotine dependence: Secondary | ICD-10-CM | POA: Diagnosis not present

## 2021-01-06 DIAGNOSIS — R7303 Prediabetes: Secondary | ICD-10-CM | POA: Diagnosis not present

## 2021-01-06 DIAGNOSIS — M25561 Pain in right knee: Secondary | ICD-10-CM | POA: Diagnosis present

## 2021-01-06 DIAGNOSIS — J449 Chronic obstructive pulmonary disease, unspecified: Secondary | ICD-10-CM | POA: Diagnosis not present

## 2021-01-06 HISTORY — PX: TOTAL KNEE ARTHROPLASTY: SHX125

## 2021-01-06 SURGERY — ARTHROPLASTY, KNEE, TOTAL
Anesthesia: Spinal | Site: Knee | Laterality: Right

## 2021-01-06 MED ORDER — PANTOPRAZOLE SODIUM 40 MG PO TBEC
40.0000 mg | DELAYED_RELEASE_TABLET | Freq: Every day | ORAL | Status: DC
  Administered 2021-01-06 – 2021-01-07 (×2): 40 mg via ORAL
  Filled 2021-01-06 (×2): qty 1

## 2021-01-06 MED ORDER — BUPIVACAINE HCL 0.25 % IJ SOLN
INTRAMUSCULAR | Status: DC | PRN
  Administered 2021-01-06: 30 mL

## 2021-01-06 MED ORDER — ACETAMINOPHEN 500 MG PO TABS
1000.0000 mg | ORAL_TABLET | Freq: Four times a day (QID) | ORAL | Status: AC
  Administered 2021-01-06 – 2021-01-07 (×4): 1000 mg via ORAL
  Filled 2021-01-06 (×4): qty 2

## 2021-01-06 MED ORDER — FLUOXETINE HCL 20 MG PO CAPS
20.0000 mg | ORAL_CAPSULE | Freq: Every day | ORAL | Status: DC
  Administered 2021-01-06 – 2021-01-07 (×2): 20 mg via ORAL
  Filled 2021-01-06 (×2): qty 1

## 2021-01-06 MED ORDER — VANCOMYCIN HCL 1000 MG IV SOLR
INTRAVENOUS | Status: AC
  Filled 2021-01-06: qty 1000

## 2021-01-06 MED ORDER — LACTATED RINGERS IV SOLN: INTRAVENOUS | Status: DC

## 2021-01-06 MED ORDER — OXYCODONE HCL 5 MG PO TABS: 5.0000 mg | ORAL_TABLET | Freq: Once | ORAL | Status: DC | PRN

## 2021-01-06 MED ORDER — CEFAZOLIN SODIUM-DEXTROSE 2-4 GM/100ML-% IV SOLN
INTRAVENOUS | Status: AC
  Filled 2021-01-06: qty 100

## 2021-01-06 MED ORDER — FERROUS SULFATE 325 (65 FE) MG PO TABS
325.0000 mg | ORAL_TABLET | ORAL | Status: DC
  Administered 2021-01-06: 325 mg via ORAL
  Filled 2021-01-06 (×2): qty 1

## 2021-01-06 MED ORDER — VANCOMYCIN HCL 1000 MG IV SOLR
INTRAVENOUS | Status: DC | PRN
  Administered 2021-01-06: 1000 mg via INTRAVENOUS

## 2021-01-06 MED ORDER — FENTANYL CITRATE (PF) 250 MCG/5ML IJ SOLN
INTRAMUSCULAR | Status: DC | PRN
  Administered 2021-01-06: 50 ug via INTRAVENOUS
  Administered 2021-01-06 (×3): 25 ug via INTRAVENOUS

## 2021-01-06 MED ORDER — ORAL CARE MOUTH RINSE: 15.0000 mL | Freq: Once | OROMUCOSAL | Status: AC

## 2021-01-06 MED ORDER — GABAPENTIN 300 MG PO CAPS
300.0000 mg | ORAL_CAPSULE | Freq: Three times a day (TID) | ORAL | Status: DC
  Administered 2021-01-06 – 2021-01-07 (×4): 300 mg via ORAL
  Filled 2021-01-06 (×4): qty 1

## 2021-01-06 MED ORDER — METHOCARBAMOL 500 MG PO TABS
500.0000 mg | ORAL_TABLET | Freq: Four times a day (QID) | ORAL | Status: DC | PRN
  Administered 2021-01-06 – 2021-01-07 (×3): 500 mg via ORAL
  Filled 2021-01-06 (×3): qty 1

## 2021-01-06 MED ORDER — CHLORHEXIDINE GLUCONATE 0.12 % MT SOLN: 15.0000 mL | Freq: Once | OROMUCOSAL | Status: AC

## 2021-01-06 MED ORDER — PHENOL 1.4 % MT LIQD: 1.0000 | OROMUCOSAL | Status: DC | PRN

## 2021-01-06 MED ORDER — ONDANSETRON HCL 4 MG/2ML IJ SOLN
4.0000 mg | Freq: Four times a day (QID) | INTRAMUSCULAR | Status: DC | PRN
  Administered 2021-01-06: 4 mg via INTRAVENOUS
  Filled 2021-01-06: qty 2

## 2021-01-06 MED ORDER — LIDOCAINE 2% (20 MG/ML) 5 ML SYRINGE
INTRAMUSCULAR | Status: AC
  Filled 2021-01-06: qty 5

## 2021-01-06 MED ORDER — ROCURONIUM BROMIDE 10 MG/ML (PF) SYRINGE
PREFILLED_SYRINGE | INTRAVENOUS | Status: AC
  Filled 2021-01-06: qty 10

## 2021-01-06 MED ORDER — ACETAMINOPHEN 500 MG PO TABS: 1000.0000 mg | ORAL_TABLET | Freq: Once | ORAL | Status: AC

## 2021-01-06 MED ORDER — FENTANYL CITRATE (PF) 100 MCG/2ML IJ SOLN: 25.0000 ug | INTRAMUSCULAR | Status: DC | PRN

## 2021-01-06 MED ORDER — BUPIVACAINE LIPOSOME 1.3 % IJ SUSP
20.0000 mL | Freq: Once | INTRAMUSCULAR | Status: AC
  Administered 2021-01-06: 20 mL
  Filled 2021-01-06: qty 20

## 2021-01-06 MED ORDER — PROPOFOL 10 MG/ML IV BOLUS
INTRAVENOUS | Status: AC
  Filled 2021-01-06: qty 40

## 2021-01-06 MED ORDER — METOCLOPRAMIDE HCL 5 MG/ML IJ SOLN
5.0000 mg | Freq: Three times a day (TID) | INTRAMUSCULAR | Status: DC | PRN
Start: 2021-01-06 — End: 2021-01-07

## 2021-01-06 MED ORDER — DOCUSATE SODIUM 100 MG PO CAPS
100.0000 mg | ORAL_CAPSULE | Freq: Two times a day (BID) | ORAL | Status: DC
  Administered 2021-01-06 – 2021-01-07 (×3): 100 mg via ORAL
  Filled 2021-01-06 (×3): qty 1

## 2021-01-06 MED ORDER — POVIDONE-IODINE 10 % EX SWAB: 2.0000 "application " | Freq: Once | CUTANEOUS | Status: DC

## 2021-01-06 MED ORDER — MORPHINE SULFATE (PF) 4 MG/ML IV SOLN
INTRAVENOUS | Status: AC
  Filled 2021-01-06: qty 2

## 2021-01-06 MED ORDER — TRANEXAMIC ACID-NACL 1000-0.7 MG/100ML-% IV SOLN
INTRAVENOUS | Status: DC | PRN
  Administered 2021-01-06: 1000 mg via INTRAVENOUS

## 2021-01-06 MED ORDER — SUCCINYLCHOLINE CHLORIDE 200 MG/10ML IV SOSY
PREFILLED_SYRINGE | INTRAVENOUS | Status: AC
  Filled 2021-01-06: qty 10

## 2021-01-06 MED ORDER — 0.9 % SODIUM CHLORIDE (POUR BTL) OPTIME
TOPICAL | Status: DC | PRN
  Administered 2021-01-06: 1000 mL

## 2021-01-06 MED ORDER — HYDROMORPHONE HCL 2 MG PO TABS
1.0000 mg | ORAL_TABLET | ORAL | Status: DC | PRN
Start: 2021-01-06 — End: 2021-01-06
  Administered 2021-01-06 (×2): 1 mg via ORAL
  Filled 2021-01-06 (×2): qty 1

## 2021-01-06 MED ORDER — METHOCARBAMOL 1000 MG/10ML IJ SOLN
500.0000 mg | Freq: Four times a day (QID) | INTRAVENOUS | Status: DC | PRN
  Filled 2021-01-06: qty 5

## 2021-01-06 MED ORDER — BUPIVACAINE-EPINEPHRINE (PF) 0.5% -1:200000 IJ SOLN
INTRAMUSCULAR | Status: DC | PRN
  Administered 2021-01-06: 15 mL via PERINEURAL

## 2021-01-06 MED ORDER — CLONIDINE HCL (ANALGESIA) 100 MCG/ML EP SOLN
EPIDURAL | Status: DC | PRN
  Administered 2021-01-06: 100 ug

## 2021-01-06 MED ORDER — ONDANSETRON HCL 4 MG PO TABS: 4.0000 mg | ORAL_TABLET | Freq: Four times a day (QID) | ORAL | Status: DC | PRN

## 2021-01-06 MED ORDER — LIDOCAINE 2% (20 MG/ML) 5 ML SYRINGE
INTRAMUSCULAR | Status: DC | PRN
  Administered 2021-01-06: 50 mg via INTRAVENOUS

## 2021-01-06 MED ORDER — OXYCODONE HCL 5 MG/5ML PO SOLN: 5.0000 mg | Freq: Once | ORAL | Status: DC | PRN

## 2021-01-06 MED ORDER — ACETAMINOPHEN 500 MG PO TABS
ORAL_TABLET | ORAL | Status: AC
  Administered 2021-01-06: 1000 mg via ORAL
  Filled 2021-01-06: qty 2

## 2021-01-06 MED ORDER — METOCLOPRAMIDE HCL 5 MG PO TABS: 5.0000 mg | ORAL_TABLET | Freq: Three times a day (TID) | ORAL | Status: DC | PRN

## 2021-01-06 MED ORDER — CEFAZOLIN SODIUM-DEXTROSE 2-4 GM/100ML-% IV SOLN
2.0000 g | Freq: Three times a day (TID) | INTRAVENOUS | Status: AC
  Administered 2021-01-06 – 2021-01-07 (×2): 2 g via INTRAVENOUS
  Filled 2021-01-06 (×2): qty 100

## 2021-01-06 MED ORDER — ASPIRIN 81 MG PO CHEW
81.0000 mg | CHEWABLE_TABLET | Freq: Two times a day (BID) | ORAL | Status: DC
  Administered 2021-01-06 – 2021-01-07 (×2): 81 mg via ORAL
  Filled 2021-01-06 (×2): qty 1

## 2021-01-06 MED ORDER — PROMETHAZINE HCL 25 MG/ML IJ SOLN: 6.2500 mg | INTRAMUSCULAR | Status: DC | PRN

## 2021-01-06 MED ORDER — HYDROMORPHONE HCL 2 MG PO TABS
2.0000 mg | ORAL_TABLET | ORAL | Status: DC | PRN
  Administered 2021-01-06 – 2021-01-07 (×4): 2 mg via ORAL
  Filled 2021-01-06 (×4): qty 1

## 2021-01-06 MED ORDER — MIDAZOLAM HCL 5 MG/5ML IJ SOLN
INTRAMUSCULAR | Status: DC | PRN
  Administered 2021-01-06 (×2): 1 mg via INTRAVENOUS

## 2021-01-06 MED ORDER — MORPHINE SULFATE (PF) 4 MG/ML IV SOLN
INTRAVENOUS | Status: DC | PRN
  Administered 2021-01-06 (×2): 4 mg

## 2021-01-06 MED ORDER — CLONIDINE HCL (ANALGESIA) 100 MCG/ML EP SOLN
EPIDURAL | Status: AC
  Filled 2021-01-06: qty 10

## 2021-01-06 MED ORDER — PROPOFOL 500 MG/50ML IV EMUL
INTRAVENOUS | Status: DC | PRN
  Administered 2021-01-06: 100 ug/kg/min via INTRAVENOUS

## 2021-01-06 MED ORDER — CEFAZOLIN SODIUM-DEXTROSE 2-4 GM/100ML-% IV SOLN
2.0000 g | INTRAVENOUS | Status: AC
  Administered 2021-01-06: 2 g via INTRAVENOUS

## 2021-01-06 MED ORDER — POVIDONE-IODINE 7.5 % EX SOLN
Freq: Once | CUTANEOUS | Status: DC
  Filled 2021-01-06: qty 118

## 2021-01-06 MED ORDER — BUPIVACAINE HCL (PF) 0.25 % IJ SOLN
INTRAMUSCULAR | Status: AC
  Filled 2021-01-06: qty 30

## 2021-01-06 MED ORDER — SODIUM CHLORIDE 0.9% FLUSH
INTRAVENOUS | Status: DC | PRN
  Administered 2021-01-06: 10 mL

## 2021-01-06 MED ORDER — SODIUM CHLORIDE 0.9 % IR SOLN
Status: DC | PRN
  Administered 2021-01-06: 3000 mL

## 2021-01-06 MED ORDER — POVIDONE-IODINE 10 % EX SWAB
2.0000 "application " | Freq: Once | CUTANEOUS | Status: AC
  Administered 2021-01-06: 2 via TOPICAL

## 2021-01-06 MED ORDER — MIDAZOLAM HCL 2 MG/2ML IJ SOLN
INTRAMUSCULAR | Status: AC
  Filled 2021-01-06: qty 2

## 2021-01-06 MED ORDER — IRRISEPT - 450ML BOTTLE WITH 0.05% CHG IN STERILE WATER, USP 99.95% OPTIME
TOPICAL | Status: DC | PRN
  Administered 2021-01-06: 450 mL via TOPICAL

## 2021-01-06 MED ORDER — HYDROMORPHONE HCL 1 MG/ML IJ SOLN
0.5000 mg | INTRAMUSCULAR | Status: DC | PRN
  Administered 2021-01-06 – 2021-01-07 (×4): 0.5 mg via INTRAVENOUS
  Filled 2021-01-06 (×5): qty 0.5

## 2021-01-06 MED ORDER — PHENYLEPHRINE HCL-NACL 10-0.9 MG/250ML-% IV SOLN
INTRAVENOUS | Status: DC | PRN
  Administered 2021-01-06: 10 ug/min via INTRAVENOUS

## 2021-01-06 MED ORDER — BUPIVACAINE IN DEXTROSE 0.75-8.25 % IT SOLN
INTRATHECAL | Status: DC | PRN
  Administered 2021-01-06: 1.8 mL via INTRATHECAL

## 2021-01-06 MED ORDER — TRANEXAMIC ACID-NACL 1000-0.7 MG/100ML-% IV SOLN
INTRAVENOUS | Status: AC
  Filled 2021-01-06: qty 100

## 2021-01-06 MED ORDER — CHLORHEXIDINE GLUCONATE 0.12 % MT SOLN
OROMUCOSAL | Status: AC
  Administered 2021-01-06: 15 mL via OROMUCOSAL
  Filled 2021-01-06: qty 15

## 2021-01-06 MED ORDER — FENTANYL CITRATE (PF) 250 MCG/5ML IJ SOLN
INTRAMUSCULAR | Status: AC
  Filled 2021-01-06: qty 5

## 2021-01-06 MED ORDER — MENTHOL 3 MG MT LOZG: 1.0000 | LOZENGE | OROMUCOSAL | Status: DC | PRN

## 2021-01-06 SURGICAL SUPPLY — 76 items
BAG DECANTER FOR FLEXI CONT (MISCELLANEOUS) ×2 IMPLANT
BANDAGE ESMARK 6X9 LF (GAUZE/BANDAGES/DRESSINGS) ×1 IMPLANT
BLADE SAG 18X100X1.27 (BLADE) ×2 IMPLANT
BNDG COHESIVE 6X5 TAN STRL LF (GAUZE/BANDAGES/DRESSINGS) ×2 IMPLANT
BNDG ELASTIC 6X15 VLCR STRL LF (GAUZE/BANDAGES/DRESSINGS) ×2 IMPLANT
BNDG ESMARK 6X9 LF (GAUZE/BANDAGES/DRESSINGS) ×2
BOWL SMART MIX CTS (DISPOSABLE) IMPLANT
CLSR STERI-STRIP ANTIMIC 1/2X4 (GAUZE/BANDAGES/DRESSINGS) ×2 IMPLANT
CNTNR URN SCR LID CUP LEK RST (MISCELLANEOUS) ×1 IMPLANT
CONT SPEC 4OZ STRL OR WHT (MISCELLANEOUS) ×2
COVER SURGICAL LIGHT HANDLE (MISCELLANEOUS) ×2 IMPLANT
COVER WAND RF STERILE (DRAPES) ×2 IMPLANT
CUFF TOURN SGL QUICK 34 (TOURNIQUET CUFF) ×2
CUFF TOURN SGL QUICK 42 (TOURNIQUET CUFF) IMPLANT
CUFF TRNQT CYL 34X4.125X (TOURNIQUET CUFF) ×1 IMPLANT
DECANTER SPIKE VIAL GLASS SM (MISCELLANEOUS) ×2 IMPLANT
DRAPE INCISE IOBAN 66X45 STRL (DRAPES) IMPLANT
DRAPE ORTHO SPLIT 77X108 STRL (DRAPES) ×6
DRAPE SURG ORHT 6 SPLT 77X108 (DRAPES) ×3 IMPLANT
DRAPE U-SHAPE 47X51 STRL (DRAPES) ×2 IMPLANT
DRSG AQUACEL AG ADV 3.5X10 (GAUZE/BANDAGES/DRESSINGS) ×2 IMPLANT
DRSG AQUACEL AG ADV 3.5X14 (GAUZE/BANDAGES/DRESSINGS) IMPLANT
DURAPREP 26ML APPLICATOR (WOUND CARE) ×4 IMPLANT
ELECT CAUTERY BLADE 6.4 (BLADE) ×2 IMPLANT
ELECT REM PT RETURN 9FT ADLT (ELECTROSURGICAL) ×2
ELECTRODE REM PT RTRN 9FT ADLT (ELECTROSURGICAL) ×1 IMPLANT
GAUZE SPONGE 4X4 12PLY STRL (GAUZE/BANDAGES/DRESSINGS) ×2 IMPLANT
GAUZE SPONGE 4X4 16PLY XRAY LF (GAUZE/BANDAGES/DRESSINGS) ×2 IMPLANT
GLOVE ECLIPSE 7.0 STRL STRAW (GLOVE) ×2 IMPLANT
GLOVE ECLIPSE 8.0 STRL XLNG CF (GLOVE) ×2 IMPLANT
GLOVE SRG 8 PF TXTR STRL LF DI (GLOVE) ×1 IMPLANT
GLOVE SURG UNDER POLY LF SZ7 (GLOVE) ×2 IMPLANT
GLOVE SURG UNDER POLY LF SZ8 (GLOVE) ×2
GOWN STRL REUS W/ TWL LRG LVL3 (GOWN DISPOSABLE) ×3 IMPLANT
GOWN STRL REUS W/TWL LRG LVL3 (GOWN DISPOSABLE) ×6
HANDPIECE INTERPULSE COAX TIP (DISPOSABLE) ×2
HOOD PEEL AWAY FLYTE STAYCOOL (MISCELLANEOUS) ×6 IMPLANT
IMMOBILIZER KNEE 20 (SOFTGOODS)
IMMOBILIZER KNEE 20 THIGH 36 (SOFTGOODS) IMPLANT
IMMOBILIZER KNEE 22 UNIV (SOFTGOODS) IMPLANT
IMMOBILIZER KNEE 24 THIGH 36 (MISCELLANEOUS) IMPLANT
IMMOBILIZER KNEE 24 UNIV (MISCELLANEOUS)
INSERT CS TRIATH SZ4 13 (Insert) ×2 IMPLANT
INSERT TIB BEARING X3 SZ4 11 (Joint) ×2 IMPLANT
KIT BASIN OR (CUSTOM PROCEDURE TRAY) ×2 IMPLANT
KIT TURNOVER KIT B (KITS) ×2 IMPLANT
KNEE FEMORAL COMP RT RETAIN (Knees) ×2 IMPLANT
KNEE PATELLA ASYMMETRIC 10X32 (Knees) ×2 IMPLANT
KNEE TIBIAL COMP TRI SZ4 (Knees) ×2 IMPLANT
MANIFOLD NEPTUNE II (INSTRUMENTS) ×2 IMPLANT
NEEDLE 22X1 1/2 (OR ONLY) (NEEDLE) ×4 IMPLANT
NEEDLE SPNL 18GX3.5 QUINCKE PK (NEEDLE) ×2 IMPLANT
NS IRRIG 1000ML POUR BTL (IV SOLUTION) ×4 IMPLANT
PACK TOTAL JOINT (CUSTOM PROCEDURE TRAY) ×2 IMPLANT
PAD ARMBOARD 7.5X6 YLW CONV (MISCELLANEOUS) ×4 IMPLANT
PAD CAST 4YDX4 CTTN HI CHSV (CAST SUPPLIES) ×1 IMPLANT
PADDING CAST COTTON 4X4 STRL (CAST SUPPLIES) ×2
PADDING CAST COTTON 6X4 STRL (CAST SUPPLIES) ×2 IMPLANT
PIN FLUTED HEDLESS FIX 3.5X1/8 (PIN) ×2 IMPLANT
SET HNDPC FAN SPRY TIP SCT (DISPOSABLE) ×1 IMPLANT
STRIP CLOSURE SKIN 1/2X4 (GAUZE/BANDAGES/DRESSINGS) ×4 IMPLANT
SUCTION FRAZIER HANDLE 10FR (MISCELLANEOUS) ×2
SUCTION TUBE FRAZIER 10FR DISP (MISCELLANEOUS) ×1 IMPLANT
SUT MNCRL AB 3-0 PS2 18 (SUTURE) ×4 IMPLANT
SUT VIC AB 0 CT1 27 (SUTURE) ×8
SUT VIC AB 0 CT1 27XBRD ANBCTR (SUTURE) ×4 IMPLANT
SUT VIC AB 1 CT1 27 (SUTURE) ×10
SUT VIC AB 1 CT1 27XBRD ANBCTR (SUTURE) ×5 IMPLANT
SUT VIC AB 2-0 CT1 27 (SUTURE) ×8
SUT VIC AB 2-0 CT1 TAPERPNT 27 (SUTURE) ×4 IMPLANT
SYR 30ML LL (SYRINGE) ×6 IMPLANT
SYR TB 1ML LUER SLIP (SYRINGE) ×2 IMPLANT
TOWEL GREEN STERILE (TOWEL DISPOSABLE) ×4 IMPLANT
TOWEL GREEN STERILE FF (TOWEL DISPOSABLE) ×4 IMPLANT
TRAY CATH 16FR W/PLASTIC CATH (SET/KITS/TRAYS/PACK) IMPLANT
WATER STERILE IRR 1000ML POUR (IV SOLUTION) IMPLANT

## 2021-01-06 NOTE — Op Note (Signed)
NAME: [name], [name] MEDICAL RECORD NO: [id number] ACCOUNT NO: [id number] DATE OF BIRTH: 06/05/1971 FACILITY: [facility] LOCATION: [facility] PHYSICIAN: Yetta Barre. Marlou Sa, MD  Operative Report   DATE OF PROCEDURE: 01/06/2021  PREOPERATIVE DIAGNOSIS:  Right knee arthritis.  POSTOPERATIVE DIAGNOSIS:  Right knee arthritis.  PROCEDURE:  Right total knee replacement using Stryker press-fit components, size 4 posterior cruciate retaining femur, size 4 tibia, 13 mm polyethylene insert, deep dish posterior cruciate retaining and 32 mm 3-peg press-fit patella.  SURGEON:  Yetta Barre. Marlou Sa, MD  ASSISTANT:  Annie Main, PA.  ANESTHESIA:  Spinal.  INDICATIONS:  The patient is a 50 year old patient with end-stage right knee arthritis and presents for operative management after explanation of risks and benefits.  DESCRIPTION OF PROCEDURE:  The patient was brought to the operating room where spinal anesthetic was induced.  Preoperative antibiotics administered.  Timeout was called.  Right leg prescrubbed with alcohol and Betadine, allowed to air dry, prepped with  DuraPrep solution and draped in a sterile manner.  Ioban used to cover the operative field.  The leg was elevated and exsanguinated with the Esmarch wrap.  Total tourniquet time was approximately 86 minutes at 300 mmHg.  Anterior approach to knee was  made.  Skin and subcutaneous tissue were sharply divided.  Median parapatellar arthrotomy was made marked with #1 Vicryl suture.  The patella everted.  Osteophytes were removed.  Lateral patellofemoral ligament released.  Fat pad partially excised.  Soft  tissue removed from the anterior distal femur.  Medial soft tissue dissection also performed due to the patient's preoperative varus deformity.  With the posterior neurovascular structures protected and the collaterals protected intramedullary alignment  was used to make a cut on the tibia.  This was a 9 mm cut off the least affected lateral tibial  plateau.  The cut was measured at 9 mm, but was closer to 12 mm after the cut was made.  This gave a 1-2 mm cut underneath to the most affected medial tibial  plateau.  Intramedullary alignment was then used to cut 8 mm off the distal femur.  This was done in 5 degrees of valgus.  Next, the anterior, posterior and chamfer cuts were made after sizing the femur to a size 4.  The trial femur placed along with a  size 4 tibia.  An 11 mm polyethylene insert placed, which gave full extension, full flexion, and good stability to varus and valgus stress.  The tibia was keeled punch.  Patella was then cut down from 26 to about 15 mm.  3-peg patellar trial was placed  and with the 11 mm spacer in place, the patient had full extension, excellent patellar tracking with no thumbs technique, no lift-off.  Next, trial components were removed thorough irrigation was performed.  Tranexamic acid was allowed to sit in the knee  for 3 minutes and a solution of Marcaine, Exparel and saline injected into the capsule for pain relief.  These were removed.  True components placed.  The 11 mm spacer, which gave good trial reduction was slightly loose.  This was changed out to a 13 mm  spacer, which gave a better medial lateral stability at 0, 30 and 90 degrees.  No liftoff, and excellent patellar tracking maintained.  Next, the tourniquet was released, bleeding points encountered were controlled using electrocautery, pouring  irrigation utilized.  Arthrotomy closed over a bolster using #1 Vicryl suture.  Prior to full closure of the arthrotomy the IrriSept solution utilized and vancomycin powder  placed.  Arthrotomy was closed using #1 Vicryl suture.  Then, a solution of  Marcaine, morphine, clonidine injected into the knee joint for postoperative pain relief.  Next, the incision was closed using 0 Vicryl suture, 2-0 Vicryl suture, 3-0 Monocryl and Steri-Strips and an Aquacel dressing.  Immobilizer and Iceman cooling pack  placed.   The patient transferred to the recovery room in stable condition, tolerated the procedure well without immediate complications.  Luke's assistance was required at all times for opening, closing, mobilization of tissue.  His assistance was a  medical necessity.   PUS D: 01/06/2021 10:51:42 am T: 01/06/2021 11:39:00 am  JOB: [id number]/ [id number]

## 2021-01-06 NOTE — OR PostOp (Incomplete)
PACU TO INPATIENT HANDOFF REPORT  Name/Age/Gender [name] 50 y.o. female  Code Status Code Status History    Date Active Date Inactive Code Status Order ID Comments User Context   11/01/2015 0324 11/02/2015 1517 Full Code [id number]  Norval Morton, MD Inpatient   03/06/2014 1721 03/07/2014 1359 Full Code [id number]  Eustace Moore, MD Inpatient   08/24/2013 0052 08/24/2013 2147 Full Code [id number]  Rise Patience, MD Inpatient   05/18/2013 0808 05/21/2013 2011 Full Code [id number]  Nita Sells, MD Inpatient   Advance Care Planning Activity    Questions for Most Recent Historical Code Status (Order [id number])       Home/SNF/Other {Discharge Destination:18313::"Home"}  Chief Complaint S/P knee replacement [Z96.659]  Level of Care/Admitting Diagnosis ED Disposition    None      Medical History Past Medical History:  Diagnosis Date  . Anemia   . Anxiety   . Arthritis    right knee, back  . COPD (chronic obstructive pulmonary disease) ([place])   . GERD (gastroesophageal reflux disease)   . Headache(784.0)   . History of blood transfusion    Hx; of in 1991 after delivery  . History of TIAs   . Numbness    Right hand  . Pneumonia   . Stroke ([place]) 05/2013    Allergies Allergies  Allergen Reactions  . Shellfish Allergy Itching and Swelling  . Amoxicillin Other (See Comments)    Yeast infection  . Latex Itching and Rash    IV Location/Drains/Wounds Patient Lines/Drains/Airways Status    Active Line/Drains/Airways    Name Placement date Placement time Site Days   Peripheral IV 01/06/21 Left Forearm 01/06/21  0700  Forearm  less than 1   Peripheral IV 01/06/21 Posterior;Left Hand 01/06/21  -  Hand  less than 1   Incision (Closed) 01/06/21 Knee Right 01/06/21  0939  - less than 1          Labs/Imaging No results found for this or any previous visit (from the past 48 hour(s)). No results found.  Pending Labs   Vitals/Pain Today's Vitals    01/06/21 1100 01/06/21 1115 01/06/21 1117 01/06/21 1127  BP: 118/76 118/77  108/80  Pulse: 60 (!) 56  (!) 57  Resp: 17 15  17   Temp: 97.6 F (36.4 C) 97.6 F (36.4 C) 97.6 F (36.4 C) 97.6 F (36.4 C)  TempSrc:      SpO2: 98% 99%  97%  Weight:      Height:      PainSc: 0-No pain Asleep  0-No pain    Isolation Precautions @ISOLATION @  Administered Medications Periop Administered Meds from 01/06/2021 0534 to 01/06/2021 1132      Date/Time Order Dose Route Action Action by Comments    01/06/2021 0732 0.9 % irrigation (POUR BTL) 1,000 mL Irrigation Given Meredith Pel, MD Expiration [phone]    01/06/2021 0558 acetaminophen (TYLENOL) tablet 1,000 mg 1,000 mg Oral Given Blythe Stanford, RN     01/06/2021 0732 bupivacaine (MARCAINE) 0.25 % (with pres) injection 30 mL Infiltration Given Meredith Pel, MD Expiration 2024    01/06/2021 0732 bupivacaine 0.75% in dextrose 8.25% (intrathecal) (SENSORCAINE) 0.75-8.25 % injection 1.8 mL Intrathecal Given Brennan Bailey, MD     01/06/2021 0732 bupivacaine liposome (EXPAREL) 1.3 % injection 266 mg 20 mL Infiltration Given Meredith Pel, MD Expiration [phone]    01/06/2021 0705 bupivacaine-epinephrine (MARCAINE W/ EPI) 0.5% -1:200000 injection 15 mL Peri-NEURAL Given Doloris Hall  E, MD     01/06/2021 0752 ceFAZolin (ANCEF) 2-4 GM/100ML-% IVPB    Override pull for Anesthesia Hoy Morn, CRNA Filed by anesthesia medication administration from clinical order [id number]    01/06/2021 0742 ceFAZolin (ANCEF) IVPB 2g/100 mL premix 2 g Intravenous Given Hoy Morn, CRNA     01/06/2021 0559 chlorhexidine (PERIDEX) 0.12 % solution 15 mL 15 mL Mouth/Throat Given Blythe Stanford, RN     01/06/2021 6789 cloNIDine (DURACLON) 100 mcg/mL inj- OR Only 100 mcg Infiltration Given Brennan Bailey, MD     01/06/2021 [id number] cloNIDine (DURACLON) 100 mcg/mL inj- OR Only 100 mcg  Given Meredith Pel, MD Expiration [phone]    01/06/2021 0835  fentaNYL citrate (PF) (SUBLIMAZE) injection 25 mcg Intravenous Given Hoy Morn, CRNA     01/06/2021 0749 fentaNYL citrate (PF) (SUBLIMAZE) injection 25 mcg Intravenous Given Hoy Morn, CRNA     01/06/2021 0732 fentaNYL citrate (PF) (SUBLIMAZE) injection 25 mcg Intravenous Given Hoy Morn, CRNA     01/06/2021 0705 fentaNYL citrate (PF) (SUBLIMAZE) injection 50 mcg Intravenous Given Hoy Morn, CRNA     01/06/2021 [phone] IRRISEPT - 0.05% chlorhexedine in sterile water for irrigation 450 mL Topical Given Meredith Pel, MD expiration [phone]    01/06/2021 1023 lactated ringers infusion   Intravenous New Bag/Given Hoy Morn, CRNA     01/06/2021 0700 lactated ringers infusion   Intravenous New Bag/Given Hoy Morn, CRNA     01/06/2021 0740 lidocaine 2% (20 mg/mL) 5 mL syringe 50 mg Intravenous Given Hoy Morn, CRNA     01/06/2021 0559 MEDLINE mouth rinse   Mouth Rinse See Alternative Blythe Stanford, RN     01/06/2021 0732 midazolam (VERSED) 5 MG/5ML injection 1 mg Intravenous Given Hoy Morn, CRNA     01/06/2021 0705 midazolam (VERSED) 5 MG/5ML injection 1 mg Intravenous Given Hoy Morn, CRNA     01/06/2021 [phone] morphine 4 MG/ML injection 4 mg  Given Meredith Pel, MD Expiration [phone]    01/06/2021 0732 morphine 4 MG/ML injection 4 mg  Given Meredith Pel, MD expiration [phone]    01/06/2021 1014 phenylephrine (NEOSYNEPHRINE) 10-0.9 MG/250ML-% infusion 0 mcg/min Intravenous Stopped Hoy Morn, CRNA     01/06/[id number] phenylephrine (NEOSYNEPHRINE) 10-0.9 MG/250ML-% infusion 10 mcg/min Intravenous New Bag/Given Hoy Morn, CRNA     01/06/2021 0559 povidone-iodine 10 % swab 2 application 2 application Topical Given Blythe Stanford, RN     01/06/2021 1014 propofol (DIPRIVAN) 500 MG/50ML infusion 0 mcg/kg/min Intravenous Stopped Hoy Morn, CRNA     01/06/2021 0740 propofol (DIPRIVAN) 500 MG/50ML infusion 100 mcg/kg/min  Intravenous New Bag/Given Hoy Morn, CRNA     01/06/2021 0734 sodium chloride flush (NS) 0.9 % injection 10 mL Other Given Meredith Pel, MD Expiration [phone]    01/06/2021 0732 sodium chloride irrigation 0.9 % 3,000 mL Irrigation Given Meredith Pel, MD Expiration [phone]    01/06/2021 0751 tranexamic acid (CYKLOKAPRON) 2,000 mg in sodium chloride 0.9 % 50 mL Topical Application 0,932 mg Topical Given Meredith Pel, MD Expiration 01/06/2021    01/06/2021 0752 tranexamic acid (CYKLOKAPRON) IVPB 1,000 mg Intravenous Given Hoy Morn, CRNA     01/06/2021 0759 vancomycin (VANCOCIN) 1,000 mg in sodium chloride 0.9 % 250 mL IVPB 1,000 mg Intravenous New Bag/Given Hoy Morn, CRNA       Mobility {Mobility:20148}

## 2021-01-06 NOTE — Anesthesia Postprocedure Evaluation (Signed)
Anesthesia Post Note  Patient: [name]  Procedure(s) Performed: RIGHT TOTAL KNEE ARTHROPLASTY (Right Knee)     Patient location during evaluation: PACU Anesthesia Type: Spinal Level of consciousness: awake and alert and oriented Pain management: pain level controlled Vital Signs Assessment: post-procedure vital signs reviewed and stable Respiratory status: spontaneous breathing, nonlabored ventilation and respiratory function stable Cardiovascular status: blood pressure returned to baseline Postop Assessment: no apparent nausea or vomiting, spinal receding, no headache and no backache Anesthetic complications: no   No complications documented.      Brennan Bailey

## 2021-01-06 NOTE — Anesthesia Procedure Notes (Signed)
Spinal  Patient location during procedure: OR Start time: 01/06/2021 7:30 AM End time: 01/06/2021 7:32 AM Staffing Performed: anesthesiologist  Anesthesiologist: Brennan Bailey, MD Preanesthetic Checklist Completed: patient identified, IV checked, risks and benefits discussed, surgical consent, monitors and equipment checked, pre-op evaluation and timeout performed Spinal Block Patient position: sitting Prep: DuraPrep and site prepped and draped Patient monitoring: continuous pulse ox, blood pressure and heart rate Approach: midline Location: L3-4 Injection technique: single-shot Needle Needle type: Pencan  Needle gauge: 24 G Needle length: 9 cm Additional Notes Risks, benefits, and alternative discussed. Patient gave consent to procedure. Prepped and draped in sitting position. Patient sedated but responsive to voice. Clear CSF obtained after one needle pass. Positive terminal aspiration. No pain or paraesthesias with injection. Patient tolerated procedure well. Vital signs stable. Tawny Asal, MD

## 2021-01-06 NOTE — Plan of Care (Signed)
  Problem: Health Behavior/Discharge Planning: Goal: Ability to manage health-related needs will improve Outcome: Progressing   Problem: Clinical Measurements: Goal: Ability to maintain clinical measurements within normal limits will improve Outcome: Progressing   Problem: Coping: Goal: Level of anxiety will decrease Outcome: Progressing   Problem: Elimination: Goal: Will not experience complications related to bowel motility Outcome: Progressing   Problem: Pain Managment: Goal: General experience of comfort will improve Outcome: Progressing   Problem: Safety: Goal: Ability to remain free from injury will improve Outcome: Progressing   Problem: Skin Integrity: Goal: Risk for impaired skin integrity will decrease Outcome: Progressing

## 2021-01-06 NOTE — Brief Op Note (Signed)
   01/06/2021  10:45 AM  PATIENT:  [name]  50 y.o. female  PRE-OPERATIVE DIAGNOSIS:  right knee ostearthritis  POST-OPERATIVE DIAGNOSIS:  right knee ostearthritis  PROCEDURE:  Procedure(s): RIGHT TOTAL KNEE ARTHROPLASTY  SURGEON:  Surgeon(s): Meredith Pel, MD  ASSISTANT: Annie Main, PA  ANESTHESIA:   spinal  EBL: 50 ml    Total I/O In: 1300 [I.V.:1000; IV Piggyback:300] Out: 750 [Urine:700; Blood:50]  BLOOD ADMINISTERED: none  DRAINS: none   LOCAL MEDICATIONS USED: Marcaine morphine clonidine Exparel vancomycin powder  SPECIMEN:  No Specimen  COUNTS:  YES  TOURNIQUET:  * Missing tourniquet times found for documented tourniquets in log: [id number] *  DICTATION: .Other Dictation: Dictation Number [id number]  PLAN OF CARE: Admit for overnight observation  PATIENT DISPOSITION:  PACU - hemodynamically stable

## 2021-01-06 NOTE — Transfer of Care (Signed)
Immediate Anesthesia Transfer of Care Note  Patient: [name]  Procedure(s) Performed: RIGHT TOTAL KNEE ARTHROPLASTY (Right Knee)  Patient Location: PACU  Anesthesia Type:Spinal  Level of Consciousness: awake  Airway & Oxygen Therapy: Patient Spontanous Breathing and Patient connected to face mask oxygen  Post-op Assessment: Report given to RN and Post -op Vital signs reviewed and stable  Post vital signs: Reviewed and stable  Last Vitals:  Vitals Value Taken Time  BP 110/71 01/06/21 1029  Temp    Pulse 57 01/06/21 1033  Resp 16 01/06/21 1033  SpO2 100 % 01/06/21 1033  Vitals shown include unvalidated device data.  Last Pain:  Vitals:   01/06/21 0557  TempSrc:   PainSc: 5       Patients Stated Pain Goal: 2 (30/10/40 4591)  Complications: No complications documented.

## 2021-01-06 NOTE — Anesthesia Procedure Notes (Signed)
Anesthesia Regional Block: Adductor canal block   Pre-Anesthetic Checklist: ,, timeout performed, Correct Patient, Correct Site, Correct Laterality, Correct Procedure, Correct Position, site marked, Risks and benefits discussed, pre-op evaluation,  At surgeon's request and post-op pain management  Laterality: Right  Prep: Maximum Sterile Barrier Precautions used, chloraprep       Needles:  Injection technique: Single-shot  Needle Type: Echogenic Stimulator Needle     Needle Length: 9cm  Needle Gauge: 22     Additional Needles:   Procedures:,,,, ultrasound used (permanent image in chart),,,,  Narrative:  Start time: 01/06/2021 7:03 AM End time: 01/06/2021 7:05 AM Injection made incrementally with aspirations every 5 mL.  Performed by: Personally  Anesthesiologist: Brennan Bailey, MD  Additional Notes: Risks, benefits, and alternative discussed. Patient gave consent for procedure. Patient prepped and draped in sterile fashion. Sedation administered, patient remains easily responsive to voice. Relevant anatomy identified with ultrasound guidance. Local anesthetic given in 5cc increments with no signs or symptoms of intravascular injection. No pain or paraesthesias with injection. Patient monitored throughout procedure with signs of LAST or immediate complications. Tolerated well. Ultrasound image placed in chart.  Tawny Asal, MD

## 2021-01-06 NOTE — Progress Notes (Signed)
Orthopedic Tech Progress Note Patient Details:  [name] [birth year] [id number]  CPM Right Knee CPM Right Knee: On Right Knee Flexion (Degrees): 10 Right Knee Extension (Degrees): 40  Post Interventions Patient Tolerated: Well Instructions Provided: Care of device Ortho Devices Type of Ortho Device: Bone foam zero knee Ortho Device/Splint Interventions: Other (comment)   Post Interventions Patient Tolerated: Well Instructions Provided: Care of device   Janit Pagan 01/06/2021, 11:18 AM

## 2021-01-06 NOTE — H&P (Signed)
TOTAL KNEE ADMISSION H&P  Patient is being admitted for right total knee arthroplasty.  Subjective:  Chief Complaint:right knee pain.  HPI: [name], 50 y.o. female, has a history of pain and functional disability in the right knee due to arthritis and has failed non-surgical conservative treatments for greater than 12 weeks to includeNSAID's and/or analgesics, corticosteriod injections, viscosupplementation injections, flexibility and strengthening excercises and activity modification.  Onset of symptoms was gradual, starting 7 years ago with gradually worsening course since that time. The patient noted prior procedures on the knee to include  arthroscopy and menisectomy on the right knee(s).  Patient currently rates pain in the right knee(s) at 8 out of 10 with activity. Patient has night pain, worsening of pain with activity and weight bearing, pain that interferes with activities of daily living, pain with passive range of motion, crepitus and joint swelling.  Patient has evidence of subchondral sclerosis and joint space narrowing by imaging studies. This patient has had A long history of right knee pain.  This is interfering with her ADLs and work.  Most of her family support is in [place].  No personal or family history of DVT or pulmonary embolism.. There is no active infection.  Patient Active Problem List   Diagnosis Date Noted  . Morning headache 06/02/2017  . Leiomyoma of body of uterus 12/24/2016  . Acute bronchitis 11/11/2016  . Prediabetes 11/11/2016  . Pap smear abnormality of cervix with ASCUS favoring benign 01/01/2016  . COPD (chronic obstructive pulmonary disease) ([place]) 11/18/2015  . GERD (gastroesophageal reflux disease) 11/18/2015  . Abnormal imaging of thyroid 11/04/2015  . Elevated d-dimer 11/01/2015  . IDA (iron deficiency anemia) 09/04/2015  . 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 S/P cervical spinal fusion 03/06/2014  . Obesity 02/22/2014  . Radicular pain in  right arm 01/11/2014  . 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 Other and unspecified hyperlipidemia 08/24/2013  . History of CVA (cerebrovascular accident) 05/18/2013  . 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 Anemia   . Anxiety   . Arthritis    right knee, back  . COPD (chronic obstructive pulmonary disease) ([place])   . GERD (gastroesophageal reflux disease)   . Headache(784.0)   . History of blood transfusion    Hx; of in 1991 after delivery  . History of TIAs   . Numbness    Right hand  . Pneumonia   . Stroke [facility]) 05/2013    Past Surgical History:  Procedure Laterality Date  . ANTERIOR CERVICAL DECOMP/DISCECTOMY FUSION N/A 03/06/2014   Procedure: ANTERIOR CERVICAL DECOMPRESSION/DISCECTOMY FUSION 2 LEVELS four/five, five/six;  Surgeon: Eustace Moore, MD;  Location: [facility] NEURO ORS;  Service: Neurosurgery;  Laterality: N/A;  . CESAREAN SECTION     x 1 with 5th pregnancy  . CHOLECYSTECTOMY    . KNEE ARTHROSCOPY Right 2015  . TEE WITHOUT CARDIOVERSION N/A 05/21/2013   Procedure: TRANSESOPHAGEAL ECHOCARDIOGRAM (TEE);  Surgeon: Sueanne Margarita, MD;  Location: [facility] ENDOSCOPY;  Service: Cardiovascular;  Laterality: N/A;  . TUBAL LIGATION      Current Facility-Administered Medications  Medication Dose Route Frequency Provider Last Rate Last Admin  . ceFAZolin (ANCEF) 2-4 GM/100ML-% IVPB           . ceFAZolin (ANCEF) IVPB 2g/100 mL premix  2 g Intravenous On Call to OR Magnant, Charles L, PA-C      . lactated ringers infusion   Intravenous Continuous Howze,  Leanord Hawking, MD      . povidone-iodine (BETADINE) 7.5 % scrub   Topical Once Magnant, Charles L, PA-C      . povidone-iodine 10 % swab 2 application  2 application Topical Once Magnant, Charles L, PA-C      . tranexamic acid (CYKLOKAPRON) 2,000 mg in sodium chloride 0.9 % 50 mL Topical Application  8,921 mg Topical To OR Marlou Sa, Tonna Corner, MD       Allergies  Allergen Reactions  .  Shellfish Allergy Itching and Swelling  . Amoxicillin Other (See Comments)    Yeast infection  . Latex Itching and Rash    Social History   Tobacco Use  . Smoking status: Former Smoker    Packs/day: 0.50    Years: 26.00    Pack years: 13.00    Types: Cigarettes    Quit date: 10/09/2015    Years since quitting: 5.2  . Smokeless tobacco: Former Network engineer Use Topics  . Alcohol use: Not Currently    Alcohol/week: 0.0 standard drinks    Comment: occ    Family History  Problem Relation Age of Onset  . Renal Disease Father        dialysis  . Hypertension Father   . Heart disease Mother   . Heart disease Maternal Grandfather   . Asthma Sister   . Asthma Maternal Aunt      Review of Systems  Musculoskeletal: Positive for arthralgias.  All other systems reviewed and are negative.   Objective:  Physical Exam Vitals reviewed.  HENT:     Head: Normocephalic.     Nose: Nose normal.     Mouth/Throat:     Mouth: Mucous membranes are moist.  Eyes:     Pupils: Pupils are equal, round, and reactive to light.  Cardiovascular:     Rate and Rhythm: Normal rate.     Pulses: Normal pulses.  Pulmonary:     Effort: Pulmonary effort is normal.  Abdominal:     General: Abdomen is flat.  Musculoskeletal:     Cervical back: Normal range of motion.  Skin:    Capillary Refill: Capillary refill takes less than 2 seconds.  Neurological:     General: No focal deficit present.     Mental Status: She is alert.  Psychiatric:        Mood and Affect: Mood normal.   Examination of the right knee demonstrates range of motion 0-1 10 with stable collateral and cruciate ligaments.  Varus alignment is present.  Pedal pulses palpable.  Ankle dorsiflexion intact.  No groin pain with internal and external rotation of the leg.  Skin intact in the left and right knee region.  Vital signs in last 24 hours: Temp:  [98.2 F (36.8 C)] 98.2 F (36.8 C) (03/01 0546) Pulse Rate:  [85] 85 (03/01  0546) Resp:  [18] 18 (03/01 0546) BP: (128)/(94) 128/94 (03/01 0546) SpO2:  [99 %] 99 % (03/01 0546) Weight:  [86.6 kg] 86.6 kg (03/01 0546)  Labs:   Estimated body mass index is 33.83 kg/m as calculated from the following:   Height as of this encounter: 5\' 3"  (1.6 m).   Weight as of this encounter: 86.6 kg.   Imaging Review Plain radiographs demonstrate severe degenerative joint disease of the right knee(s). The overall alignment ismild varus. The bone quality appears to be good for age and reported activity level.      Assessment/Plan:  End stage arthritis, right knee  The patient history, physical examination, clinical judgment of the provider and imaging studies are consistent with end stage degenerative joint disease of the right knee(s) and total knee arthroplasty is deemed medically necessary. The treatment options including medical management, injection therapy arthroscopy and arthroplasty were discussed at length. The risks and benefits of total knee arthroplasty were presented and reviewed. The risks due to aseptic loosening, infection, stiffness, patella tracking problems, thromboembolic complications and other imponderables were discussed. The patient acknowledged the explanation, agreed to proceed with the plan and consent was signed. Patient is being admitted for inpatient treatment for surgery, pain control, PT, OT, prophylactic antibiotics, VTE prophylaxis, progressive ambulation and ADL's and discharge planning. The patient is planning to be discharged home with home health services     Patient's anticipated LOS is less than 2 midnights, meeting these requirements: - Younger than 10 - Lives within 1 hour of care - Has a competent adult at home to recover with post-op recover - NO history of  - Chronic pain requiring opiods  - Diabetes  - Coronary Artery Disease  - Heart failure  - Heart attack  - Stroke  - DVT/VTE  - Cardiac arrhythmia  - Respiratory  Failure/COPD  - Renal failure  - Anemia  - Advanced Liver disease

## 2021-01-06 NOTE — Evaluation (Signed)
Physical Therapy Evaluation Patient Details Name: [name] MRN: [id number] DOB: 06/13/1971 Today's Date: 01/06/2021   History of Present Illness  Pt is a 50 y/o female s/p R TKA. PMH includes COPD, CVA, and s/p ACDF.  Clinical Impression  Pt is s/p surgery above with deficits below. Pt with increased pain, nausea and dizziness, so mobility limited this session. Limited weightbearing on RLE secondary to pain. Requiring min guard A for mobility tasks using RW. Anticipate pt will progress well once pain controlled. Educated about knee precautions. Will continue to follow acutely.     Follow Up Recommendations Follow surgeon's recommendation for DC plan and follow-up therapies    Equipment Recommendations  Rolling walker with 5" wheels;3in1 (PT)    Recommendations for Other Services       Precautions / Restrictions Precautions Precautions: Knee Precaution Booklet Issued: No Precaution Comments: Verbally reviewed knee precautions. Restrictions Weight Bearing Restrictions: Yes RLE Weight Bearing: Weight bearing as tolerated      Mobility  Bed Mobility Overal bed mobility: Needs Assistance Bed Mobility: Sit to Supine       Sit to supine: Supervision   General bed mobility comments: Supervision for safety.    Transfers Overall transfer level: Needs assistance Equipment used: Rolling walker (2 wheeled) Transfers: Sit to/from Stand Sit to Stand: Min guard         General transfer comment: Min guard for safety. Cues for hand placement.  Ambulation/Gait Ambulation/Gait assistance: Min guard Gait Distance (Feet): 5 Feet Assistive device: Rolling walker (2 wheeled) Gait Pattern/deviations: Step-to pattern;Decreased weight shift to right;Antalgic Gait velocity: Decreased   General Gait Details: Pt with limited weightbearing on RLE secondary to pain. Cues for WBAT, however, pt performed more of hop to pattern. Pt reporting increased nausea and dizziness during gait, so  further mobility deferred.  Stairs            Wheelchair Mobility    Modified Rankin (Stroke Patients Only)       Balance Overall balance assessment: Needs assistance Sitting-balance support: No upper extremity supported;Feet supported Sitting balance-Leahy Scale: Fair     Standing balance support: Bilateral upper extremity supported;During functional activity Standing balance-Leahy Scale: Poor Standing balance comment: Reliant on BUE support                             Pertinent Vitals/Pain Pain Assessment: Faces Faces Pain Scale: Hurts whole lot Pain Location: R knee Pain Descriptors / Indicators: Aching;Operative site guarding Pain Intervention(s): Limited activity within patient's tolerance;Monitored during session;Repositioned    Home Living Family/patient expects to be discharged to:: Private residence Living Arrangements: Children Available Help at Discharge: Family;Available 24 hours/day Type of Home: Apartment Home Access: Stairs to enter Entrance Stairs-Rails: Left Entrance Stairs-Number of Steps: 12 Home Layout: One level Home Equipment: None      Prior Function Level of Independence: Independent               Hand Dominance        Extremity/Trunk Assessment   Upper Extremity Assessment Upper Extremity Assessment: Overall WFL for tasks assessed    Lower Extremity Assessment Lower Extremity Assessment: RLE deficits/detail RLE Deficits / Details: Deficits consistent with post op pain and weakness.    Cervical / Trunk Assessment Cervical / Trunk Assessment: Normal  Communication   Communication: No difficulties  Cognition Arousal/Alertness: Awake/alert Behavior During Therapy: WFL for tasks assessed/performed Overall Cognitive Status: Within Functional Limits for tasks assessed  General Comments General comments (skin integrity, edema, etc.): Pt's sister present  during session.    Exercises     Assessment/Plan    PT Assessment Patient needs continued PT services  PT Problem List Decreased strength;Decreased range of motion;Decreased balance;Decreased activity tolerance;Decreased mobility;Decreased knowledge of use of DME;Decreased knowledge of precautions;Pain       PT Treatment Interventions Gait training;DME instruction;Stair training;Functional mobility training;Therapeutic activities;Therapeutic exercise;Balance training;Patient/family education    PT Goals (Current goals can be found in the Care Plan section)  Acute Rehab PT Goals Patient Stated Goal: to decrease pain PT Goal Formulation: With patient Time For Goal Achievement: 01/20/21 Potential to Achieve Goals: Good    Frequency 7X/week   Barriers to discharge        Co-evaluation               AM-PAC PT "6 Clicks" Mobility  Outcome Measure Help needed turning from your back to your side while in a flat bed without using bedrails?: None Help needed moving from lying on your back to sitting on the side of a flat bed without using bedrails?: A Little Help needed moving to and from a bed to a chair (including a wheelchair)?: A Little Help needed standing up from a chair using your arms (e.g., wheelchair or bedside chair)?: A Little Help needed to walk in hospital room?: A Little Help needed climbing 3-5 steps with a railing? : A Lot 6 Click Score: 18    End of Session   Activity Tolerance: Treatment limited secondary to medical complications (Comment) (nausea/dizziness) Patient left: in bed;with call bell/phone within reach Nurse Communication: Mobility status;Patient requests pain meds;Other (comment) (pt with episode of vomiting) PT Visit Diagnosis: Unsteadiness on feet (R26.81);Muscle weakness (generalized) (M62.81);Pain Pain - Right/Left: Right Pain - part of body: Knee    Time: 1594-7076 PT Time Calculation (min) (ACUTE ONLY): 23 min   Charges:   PT  Evaluation $PT Eval Low Complexity: 1 Low PT Treatments $Therapeutic Activity: 8-22 mins        Lou Miner, DPT  Acute Rehabilitation Services  Pager: [phone] Office: [phone]   Rudean Hitt 01/06/2021, 3:09 PM

## 2021-01-07 ENCOUNTER — Telehealth: Payer: Self-pay | Admitting: *Deleted

## 2021-01-07 ENCOUNTER — Encounter (HOSPITAL_COMMUNITY): Payer: Self-pay | Admitting: Orthopedic Surgery

## 2021-01-07 DIAGNOSIS — M1711 Unilateral primary osteoarthritis, right knee: Secondary | ICD-10-CM | POA: Diagnosis not present

## 2021-01-07 MED ORDER — ONDANSETRON HCL 4 MG PO TABS: 4.0000 mg | ORAL_TABLET | Freq: Three times a day (TID) | ORAL | 0 refills | Status: DC | PRN

## 2021-01-07 MED ORDER — ASPIRIN 81 MG PO CHEW: 81.0000 mg | CHEWABLE_TABLET | Freq: Two times a day (BID) | ORAL | 0 refills | Status: DC

## 2021-01-07 MED ORDER — METHOCARBAMOL 500 MG PO TABS: 500.0000 mg | ORAL_TABLET | Freq: Three times a day (TID) | ORAL | 0 refills | Status: DC | PRN

## 2021-01-07 MED ORDER — HYDROMORPHONE HCL 2 MG PO TABS: 1.0000 mg | ORAL_TABLET | ORAL | 0 refills | Status: DC | PRN

## 2021-01-07 MED ORDER — GABAPENTIN 300 MG PO CAPS: 300.0000 mg | ORAL_CAPSULE | Freq: Three times a day (TID) | ORAL | 0 refills | Status: DC

## 2021-01-07 NOTE — TOC Transition Note (Addendum)
Transition of Care [facility]) - CM/SW Discharge Note   Patient Details  Name: ANECIA NUSBAUM MRN: [id number] Date of Birth: 12-20-1970  [facility] of [facility]) CM/SW Contact:  Sharin Mons, RN Phone Number: 01/07/2021, 3:02 PM   Clinical Narrative:    Patient will DC to: home Anticipated DC date: 01/07/2021 Family notified: sister, Lavella Lemons Transport by: car           - s/p right TKA, 3/1  Per MD patient ready for DC today. RN, patient, and patient's family notified of DC. DME : rolling walker and 3 in1/BSC will be delivered to bedside prior to d/c. CPM will be delivered per [employer] / Ovid Curd to pt's home once d/c. [facility] orders noted. Pt agreeable to [facility] services. Pt without preference. Referral initially made with [facility] however was declined 2/2 staffing, Alvis Lemmings not in network.  [facility] reviewing, acceptance pending....  Pt cell # [phone]   Pt without Rx med concerns.  Post hospital f/u noted on AVS.  01/07/2021 @ [place] accepted referral for HHPT services.  Final next level of care: Home w Home Health Services Barriers to Discharge: No Barriers Identified   Patient Goals and CMS Choice        Discharge Placement                       Discharge Plan and Services                DME Arranged: 3-N-1,Walker rolling DME Agency: [employer]       HH Arranged: PT [place] Agency: [place] ([place]) Date [place]: 01/07/21 Time [place]: 1502 Representative spoke with at [place]: [place] Determinants of Health ([place]) Interventions     Readmission Risk Interventions No flowsheet data found.

## 2021-01-07 NOTE — Progress Notes (Signed)
Physical Therapy Treatment Patient Details Name: [name] MRN: [id number] DOB: [birth year] Today's Date: 01/07/2021    History of Present Illness Pt is a 50 y/o female s/p R TKA. PMH includes COPD, CVA, and s/p ACDF.    PT Comments    Pt required min assist bed mobility, min guard assist transfers, and min guard assist ambulation 200' with RW. Ascend/descend 5 steps with L rail min assist. Pt reporting 9/10 R knee pain. RN notified of request for pain meds. Pt in recliner with feet elevated at end of session.    Follow Up Recommendations  Follow surgeon's recommendation for DC plan and follow-up therapies     Equipment Recommendations  Rolling walker with 5" wheels;3in1 (PT)    Recommendations for Other Services       Precautions / Restrictions Precautions Precautions: Knee Restrictions RLE Weight Bearing: Weight bearing as tolerated    Mobility  Bed Mobility Overal bed mobility: Needs Assistance Bed Mobility: Supine to Sit     Supine to sit: Min assist     General bed mobility comments: assist with RLE OOB and to elevate trunk    Transfers Overall transfer level: Needs assistance Equipment used: Rolling walker (2 wheeled) Transfers: Sit to/from Stand Sit to Stand: Min guard         General transfer comment: cues for hand placement  Ambulation/Gait Ambulation/Gait assistance: Min guard Gait Distance (Feet): 200 Feet Assistive device: Rolling walker (2 wheeled) Gait Pattern/deviations: Decreased weight shift to right;Antalgic;Step-to pattern;Decreased stride length Gait velocity: Decreased Gait velocity interpretation: <1.31 ft/sec, indicative of household ambulator General Gait Details: cues for distance from [employer] Stairs: Yes Stairs assistance: Min assist Stair Management: One rail Left;Forwards Number of Stairs: 5 General stair comments: cues for sequencing, number of steps limited by IV line (antibiotic running)   Wheelchair  Mobility    Modified Rankin (Stroke Patients Only)       Balance Overall balance assessment: Needs assistance Sitting-balance support: No upper extremity supported;Feet supported Sitting balance-Leahy Scale: Good     Standing balance support: Bilateral upper extremity supported;During functional activity Standing balance-Leahy Scale: Poor Standing balance comment: Reliant on BUE support                            Cognition Arousal/Alertness: Awake/alert Behavior During Therapy: WFL for tasks assessed/performed Overall Cognitive Status: Within Functional Limits for tasks assessed                                        Exercises      General Comments General comments (skin integrity, edema, etc.): VSS on RA      Pertinent Vitals/Pain Pain Assessment: 0-10 Pain Score: 9  Pain Location: R knee Pain Descriptors / Indicators: Grimacing;Guarding;Sore;Spasm Pain Intervention(s): Repositioned;Ice applied;Monitored during session;Patient requesting pain meds-RN notified    Home Living                      Prior Function            PT Goals (current goals can now be found in the care plan section) Acute Rehab PT Goals Patient Stated Goal: to decrease pain Progress towards PT goals: Progressing toward goals    Frequency    7X/week      PT Plan      Co-evaluation  AM-PAC PT "6 Clicks" Mobility   Outcome Measure  Help needed turning from your back to your side while in a flat bed without using bedrails?: None Help needed moving from lying on your back to sitting on the side of a flat bed without using bedrails?: A Little Help needed moving to and from a bed to a chair (including a wheelchair)?: A Little Help needed standing up from a chair using your arms (e.g., wheelchair or bedside chair)?: A Little Help needed to walk in hospital room?: A Little Help needed climbing 3-5 steps with a railing? : A Little 6  Click Score: 19    End of Session Equipment Utilized During Treatment: Gait belt Activity Tolerance: Patient tolerated treatment well Patient left: in chair;with call bell/phone within reach Nurse Communication: Mobility status;Patient requests pain meds PT Visit Diagnosis: Unsteadiness on feet (R26.81);Muscle weakness (generalized) (M62.81);Pain Pain - Right/Left: Right Pain - part of body: Knee     Time: 0757-0822 PT Time Calculation (min) (ACUTE ONLY): 25 min  Charges:  $Gait Training: 23-37 mins                     Lorrin Goodell, [place]  Office # [phone] Pager [phone]    [name] 01/07/2021, 8:37 AM

## 2021-01-07 NOTE — Telephone Encounter (Signed)
TCT patient to advise her of cancelled appt for tomorrow 01/08/21 as she just had knee replacement surgery on 01/06/21. Advised we would re-schedule in 3-4 weeks. Hemoglobin stable pre-op

## 2021-01-07 NOTE — Progress Notes (Addendum)
  Subjective: [name] is a 50 y.o. female s/p right TKA.  They are POD1.  Pt's pain is moderate but controlled.  Pt has ambulated with some difficulty.  She was able to walk to her chair in the room yesterday and has been ambulatory throughout the morning she says. No dizziness/lightheadedness aside from her first instance of walking.  Using CPM machine without difficulty  Objective: Vital signs in last 24 hours: Temp:  [97.4 F (36.3 C)-99.1 F (37.3 C)] 99.1 F (37.3 C) (03/02 0501) Pulse Rate:  [56-88] 88 (03/02 0501) Resp:  [15-19] 18 (03/02 0501) BP: (108-159)/(63-95) 157/87 (03/02 0501) SpO2:  [91 %-100 %] 95 % (03/02 0501)  Intake/Output from previous day: 03/01 0701 - 03/02 0700 In: 2494.7 [P.O.:60; I.V.:1934.7; IV Piggyback:500] Out: 750 [Urine:700; Blood:50] Intake/Output this shift: No intake/output data recorded.  Exam:  No gross blood or drainage overlying the dressing 2+ DP pulse Sensation intact distally in the right foot Able to dorsiflex and plantarflex the right foot   Labs: No results for input(s): HGB in the last 72 hours. No results for input(s): WBC, RBC, HCT, PLT in the last 72 hours. No results for input(s): NA, K, CL, CO2, BUN, CREATININE, GLUCOSE, CALCIUM in the last 72 hours. No results for input(s): LABPT, INR in the last 72 hours.  Assessment/Plan: Pt is POD1 s/p right TKA.    -Plan to discharge to home today or tomorrow pending patient's pain and PT eval. She requests discharge home today.  She is awaiting home CPM machine delivery to her hospital room.  Once she mobilizes well with PT and has her CPM machine delivered, clear for discharge  -WBAT with a walker     [name] 01/07/2021, 8:01 AM

## 2021-01-07 NOTE — Plan of Care (Signed)

## 2021-01-07 NOTE — Progress Notes (Signed)
Pt was given her AVS discharge summary and went over with her. IV was removed with catheter intact. Walker and 3 in 1 sent home with patient. Pt had no further questions.

## 2021-01-07 NOTE — Progress Notes (Signed)
Patient refusing CPM machine and bone foam placement tonight d/t pain. Nurse educated patient on importance of using both devices. Ice applied and Pain medication administered.

## 2021-01-07 NOTE — Progress Notes (Signed)
Physical Therapy Treatment Patient Details Name: [name] MRN: [id number] DOB: 1970-11-11 Today's Date: 01/07/2021    History of Present Illness Pt is a 50 y/o female s/p R TKA. PMH includes COPD, CVA, and s/p ACDF.    PT Comments    On arrival in PM, pt in bed sleeping. Groggy throughout session most likely due to pain meds. Session focused on HEP. Pt performed LE exercises supine in bed. Sister present in room. Exercise education provided to sister as well. Exercise handouts provided. Pt remained in bed at end of session.    Follow Up Recommendations  Follow surgeon's recommendation for DC plan and follow-up therapies     Equipment Recommendations  Rolling walker with 5" wheels;3in1 (PT)    Recommendations for Other Services       Precautions / Restrictions Precautions Precautions: Knee Precaution Comments: Verbally reviewed knee precautions. Restrictions RLE Weight Bearing: Weight bearing as tolerated    Mobility  Bed Mobility                    Transfers                    Ambulation/Gait                 Stairs             Wheelchair Mobility    Modified Rankin (Stroke Patients Only)       Balance                                            Cognition Arousal/Alertness: Lethargic;Suspect due to medications Behavior During Therapy: [facility] for tasks assessed/performed Overall Cognitive Status: Within Functional Limits for tasks assessed                                        Exercises Total Joint Exercises Ankle Circles/Pumps: AROM;Both;10 reps;Supine Quad Sets: AROM;Right;10 reps;Supine Heel Slides: AAROM;Right;10 reps;Supine Hip ABduction/ADduction: AAROM;Right;10 reps;Supine    General Comments        Pertinent Vitals/Pain Pain Assessment: Faces Faces Pain Scale: Hurts even more Pain Location: R knee during ROM Pain Descriptors / Indicators: Grimacing;Guarding Pain  Intervention(s): Limited activity within patient's tolerance;Premedicated before session;Ice applied    Home Living                      Prior Function            PT Goals (current goals can now be found in the care plan section) Acute Rehab PT Goals Patient Stated Goal: home Progress towards PT goals: Progressing toward goals    Frequency    7X/week      PT Plan Current plan remains appropriate    Co-evaluation              AM-PAC PT "6 Clicks" Mobility   Outcome Measure  Help needed turning from your back to your side while in a flat bed without using bedrails?: None Help needed moving from lying on your back to sitting on the side of a flat bed without using bedrails?: A Little Help needed moving to and from a bed to a chair (including a wheelchair)?: A Little Help needed standing up from a chair using your arms (  e.g., wheelchair or bedside chair)?: A Little Help needed to walk in hospital room?: A Little Help needed climbing 3-5 steps with a railing? : A Little 6 Click Score: 19    End of Session   Activity Tolerance: Patient tolerated treatment well Patient left: in bed;with call bell/phone within reach;with family/visitor present Nurse Communication: Mobility status PT Visit Diagnosis: Unsteadiness on feet (R26.81);Muscle weakness (generalized) (M62.81);Pain Pain - Right/Left: Right Pain - part of body: Knee     Time: 1448-1856 PT Time Calculation (min) (ACUTE ONLY): 15 min  Charges:  $Therapeutic Exercise: 8-22 mins                     Lorrin Goodell, PT  Office # [phone] Pager [phone]    Lorriane Shire 01/07/2021, 1:03 PM

## 2021-01-07 NOTE — Plan of Care (Signed)
  Problem: Education: Goal: Knowledge of General Education information will improve Description: Including pain rating scale, medication(s)/side effects and non-pharmacologic comfort measures Outcome: Adequate for Discharge   Problem: Health Behavior/Discharge Planning: Goal: Ability to manage health-related needs will improve Outcome: Adequate for Discharge   Problem: Clinical Measurements: Goal: Ability to maintain clinical measurements within normal limits will improve Outcome: Adequate for Discharge Goal: Will remain free from infection Outcome: Adequate for Discharge Goal: Diagnostic test results will improve Outcome: Adequate for Discharge Goal: Respiratory complications will improve Outcome: Adequate for Discharge Goal: Cardiovascular complication will be avoided Outcome: Adequate for Discharge   Problem: Activity: Goal: Risk for activity intolerance will decrease Outcome: Adequate for Discharge   Problem: Nutrition: Goal: Adequate nutrition will be maintained Outcome: Adequate for Discharge   Problem: Coping: Goal: Level of anxiety will decrease Outcome: Adequate for Discharge   Problem: Elimination: Goal: Will not experience complications related to bowel motility Outcome: Adequate for Discharge Goal: Will not experience complications related to urinary retention Outcome: Adequate for Discharge   Problem: Pain Managment: Goal: General experience of comfort will improve Outcome: Adequate for Discharge   Problem: Safety: Goal: Ability to remain free from injury will improve Outcome: Adequate for Discharge   Problem: Skin Integrity: Goal: Risk for impaired skin integrity will decrease Outcome: Adequate for Discharge   Problem: Acute Rehab PT Goals(only PT should resolve) Goal: Pt Will Go Supine/Side To Sit Outcome: Adequate for Discharge Goal: Pt Will Go Sit To Supine/Side Outcome: Adequate for Discharge Goal: Patient Will Transfer Sit To/From  Stand Outcome: Adequate for Discharge Goal: Pt Will Ambulate Outcome: Adequate for Discharge Goal: Pt Will Go Up/Down Stairs Outcome: Adequate for Discharge

## 2021-01-08 ENCOUNTER — Telehealth: Payer: Self-pay | Admitting: Hematology and Oncology

## 2021-01-08 ENCOUNTER — Inpatient Hospital Stay: Payer: PRIVATE HEALTH INSURANCE | Admitting: Hematology and Oncology

## 2021-01-08 ENCOUNTER — Inpatient Hospital Stay: Payer: PRIVATE HEALTH INSURANCE

## 2021-01-08 ENCOUNTER — Telehealth: Payer: Self-pay

## 2021-01-08 NOTE — Telephone Encounter (Signed)
Rescheduled appointment per 03/02 schedule message. Attempted to contact patient, left a detail message about new appointments.

## 2021-01-08 NOTE — Telephone Encounter (Signed)
Transition Care Management Follow-up Telephone Call  Date of discharge and from where: 01/07/2021, [facility]   How have you been since you were released from the hospital? She said she is doing okay  Any questions or concerns? No  Items Reviewed:  Did the pt receive and understand the discharge instructions provided? Yes   Medications obtained and verified? Yes  - she said she has all medications and did not have any questions about her med regime.   Other? No   Any new allergies since your discharge? No   Do you have support at home? Yes  - her sister is staying with her until Sunday and then she will rely on friends for assistance.   Home Care and Equipment/Supplies: Were home health services ordered? yes If so, what is the name of the agency? [place]  Has the agency set up a time to come to the patient's home? No, not yet. Were any new equipment or medical supplies ordered?  Yes: RW, 3:1 commode, CPM machine What is the name of the medical supply agency? [employer]: RW and 3:1; [employer]: CPM Were you able to get the supplies/equipment? yes Do you have any questions related to the use of the equipment or supplies? No  Functional Questionnaire: (I = Independent and D = Dependent) ADLs: dependent, her sister is currently providing assistance as needed.  Has RW for ambulation, WBAT RLE  Follow up appointments reviewed:   PCP Hospital f/u appt confirmed? Yes  - Dr Juleen China 01/26/2021  Specialist Hospital f/u appt confirmed? Yes Dr Marlou Sa - 01/21/2021.   Are transportation arrangements needed? No   If their condition worsens, is the pt aware to call PCP or go to the Emergency Dept.? Yes  Was the patient provided with contact information for the PCP's office or ED? Yes  Was to pt encouraged to call back with questions or concerns? Yes

## 2021-01-10 DIAGNOSIS — M1711 Unilateral primary osteoarthritis, right knee: Secondary | ICD-10-CM

## 2021-01-11 ENCOUNTER — Encounter: Payer: Self-pay | Admitting: Family

## 2021-01-11 ENCOUNTER — Telehealth: Payer: PRIVATE HEALTH INSURANCE | Admitting: Family

## 2021-01-11 DIAGNOSIS — B373 Candidiasis of vulva and vagina: Secondary | ICD-10-CM

## 2021-01-11 DIAGNOSIS — N898 Other specified noninflammatory disorders of vagina: Secondary | ICD-10-CM

## 2021-01-11 DIAGNOSIS — B3731 Acute candidiasis of vulva and vagina: Secondary | ICD-10-CM

## 2021-01-11 MED ORDER — FLUCONAZOLE 150 MG PO TABS: 150.0000 mg | ORAL_TABLET | ORAL | 0 refills | Status: DC | PRN

## 2021-01-11 NOTE — Progress Notes (Signed)
   Virtual Visit via telephone Note Due to COVID-19 pandemic this visit was conducted virtually. This visit type was conducted due to national recommendations for restrictions regarding the COVID-19 Pandemic (e.g. social distancing, sheltering in place) in an effort to limit this patient's exposure and mitigate transmission in our community. All issues noted in this document were discussed and addressed.  A physical exam was not performed with this format.  I connected with [name] on 01/11/21 at 12:14 pm  by telephone and verified that I am speaking with the correct person using two identifiers. [name] is currently located at home and no one is currently with him  during visit. The provider, Evelina Dun, FNP is located in their office at time of visit.  I discussed the limitations, risks, security and privacy concerns of performing an evaluation and management service by telephone and the availability of in person appointments. I also discussed with the patient that there may be a patient responsible charge related to this service. The patient expressed understanding and agreed to proceed.   History and Present Illness:  Vaginal Discharge The patient's primary symptoms include genital itching and vaginal discharge. This is a new problem. The current episode started in the past 7 days. The problem occurs intermittently. The problem has been waxing and waning. Pertinent negatives include no chills, dysuria or painful intercourse. She has tried nothing for the symptoms. The treatment provided mild relief.      Review of Systems  Constitutional: Negative for chills.  Genitourinary: Positive for vaginal discharge. Negative for dysuria.     Observations/Objective: No SOB or distress noted  Assessment and Plan: 1. Vaginal discharge  2. Vagina, candidiasis Keep clean and dry Start diflucan Call if symptoms worsen or do not improve  - fluconazole (DIFLUCAN) 150 MG tablet;  Take 1 tablet (150 mg total) by mouth every three (3) days as needed.  Dispense: 3 tablet; Refill: 0       I discussed the assessment and treatment plan with the patient. The patient was provided an opportunity to ask questions and all were answered. The patient agreed with the plan and demonstrated an understanding of the instructions.   The patient was advised to call back or seek an in-person evaluation if the symptoms worsen or if the condition fails to improve as anticipated.  The above assessment and management plan was discussed with the patient. The patient verbalized understanding of and has agreed to the management plan. Patient is aware to call the clinic if symptoms persist or worsen. Patient is aware when to return to the clinic for a follow-up visit. Patient educated on when it is appropriate to go to the emergency department.   Time call ended:  12:20 pm   I provided 6 minutes of  face-to-face time during this encounter.    Evelina Dun, FNP

## 2021-01-12 ENCOUNTER — Telehealth: Payer: Self-pay

## 2021-01-12 NOTE — Telephone Encounter (Signed)
[facility] called asking for a delay of care for tomorrow for the PT.  [name] would like a call back with these orders.

## 2021-01-13 ENCOUNTER — Telehealth: Payer: Self-pay | Admitting: Orthopedic Surgery

## 2021-01-13 ENCOUNTER — Other Ambulatory Visit: Payer: Self-pay

## 2021-01-13 NOTE — Telephone Encounter (Signed)
[facility] LMVM advising verbal given.

## 2021-01-13 NOTE — Telephone Encounter (Signed)
IC LM for them to call me back. Need further information on why requesting delay of care.

## 2021-01-13 NOTE — Telephone Encounter (Signed)
[name] with [facility] called with PT orders that have a frequency of three times a week for 2 weeks   [phone]

## 2021-01-13 NOTE — Telephone Encounter (Signed)
Pls advise.  

## 2021-01-14 ENCOUNTER — Other Ambulatory Visit: Payer: Self-pay

## 2021-01-14 MED ORDER — HYDROMORPHONE HCL 2 MG PO TABS
1.0000 mg | ORAL_TABLET | Freq: Four times a day (QID) | ORAL | 0 refills | Status: DC | PRN
Start: 2021-01-14 — End: 2021-01-22

## 2021-01-14 NOTE — Discharge Summary (Signed)
Physician Discharge Summary      Patient ID: [name] MRN: [id number] DOB/AGE: 52/06/1971 52 y.o.  Admit date: 01/06/2021 Discharge date: 01/07/2021  Admission Diagnoses:  Active Problems:   S/P knee replacement   Arthritis of right knee   Discharge Diagnoses:  Same  Surgeries: Procedure(s): RIGHT TOTAL KNEE ARTHROPLASTY on 01/06/2021   Consultants:   Discharged Condition: Stable  Hospital Course: [name] is an 50 y.o. female who was admitted 01/06/2021 with a chief complaint of right knee pain, and found to have a diagnosis of right knee osteoarthritis.  They were brought to the operating room on 01/06/2021 and underwent the above named procedures.  Pt awoke from anesthesia without complication and was transferred to the floor. On POD1, patient's pain was moderately well controlled and she had been ambulatory throughout the morning.  Denies any dizziness or lightheadedness.  She progressed well with physical therapy and was discharged home on postop day 1..  Pt will f/u with Dr. Marlou Sa in clinic in ~2 weeks.   Antibiotics given:  Anti-infectives (From admission, onward)   Start     Dose/Rate Route Frequency Ordered Stop   01/06/21 1600  ceFAZolin (ANCEF) IVPB 2g/100 mL premix        2 g 200 mL/hr over 30 Minutes Intravenous Every 8 hours 01/06/21 1035 01/07/21 0109   01/06/21 0600  ceFAZolin (ANCEF) IVPB 2g/100 mL premix        2 g 200 mL/hr over 30 Minutes Intravenous On call to O.R. 01/06/21 0545 01/06/21 0742   01/06/21 0547  ceFAZolin (ANCEF) 2-4 GM/100ML-% IVPB       Note to Pharmacy: Gustavo Lah   : cabinet override      01/06/21 0547 01/06/21 0752    .  Recent vital signs:  Vitals:   01/07/21 0501 01/07/21 0904  BP: (!) 157/87 (!) 143/81  Pulse: 88 92  Resp: 18 17  Temp: 99.1 F (37.3 C) 98.9 F (37.2 C)  SpO2: 95% (!) 89%    Recent laboratory studies:  Results for orders placed or performed during the hospital encounter of 01/02/21  SARS  CORONAVIRUS 2 (TAT 6-24 HRS) Nasopharyngeal Nasopharyngeal Swab   Specimen: Nasopharyngeal Swab  Result Value Ref Range   SARS Coronavirus 2 NEGATIVE NEGATIVE    Discharge Medications:   Allergies as of 01/07/2021      Reactions   Shellfish Allergy Itching, Swelling   Amoxicillin Other (See Comments)   Yeast infection   Latex Itching, Rash      Medication List    STOP taking these medications   aspirin-acetaminophen-caffeine 250-250-65 MG tablet Commonly known as: EXCEDRIN MIGRAINE   benzonatate 100 MG capsule Commonly known as: TESSALON     TAKE these medications   aspirin 81 MG chewable tablet Chew 1 tablet (81 mg total) by mouth 2 (two) times daily.   ferrous sulfate 325 (65 FE) MG tablet Take 1 tablet (325 mg total) by mouth every other day.   ferumoxytol 510 MG/17ML Soln injection Commonly known as: FERAHEME Inject 17 mLs (510 mg total) into the vein once for 1 dose.   FLUoxetine 20 MG capsule Commonly known as: PROzac Take 1 capsule (20 mg total) by mouth daily.   gabapentin 300 MG capsule Commonly known as: NEURONTIN Take 1 capsule (300 mg total) by mouth 3 (three) times daily.   methocarbamol 500 MG tablet Commonly known as: ROBAXIN Take 1 tablet (500 mg total) by mouth every 8 (eight) hours as needed  for muscle spasms.   omeprazole 20 MG capsule Commonly known as: PRILOSEC Take 20 mg by mouth daily.   ondansetron 4 MG tablet Commonly known as: ZOFRAN Take 1 tablet (4 mg total) by mouth every 8 (eight) hours as needed for nausea.       Diagnostic Studies: No results found.  Disposition: Discharge disposition: 01-Home or Self Care       Discharge Instructions    Call MD / Call 911   Complete by: As directed    If you experience chest pain or shortness of breath, CALL 911 and be transported to the hospital emergency room.  If you develope a fever above 101 F, pus (white drainage) or increased drainage or redness at the wound, or calf pain,  call your surgeon's office.   Constipation Prevention   Complete by: As directed    Drink plenty of fluids.  Prune juice may be helpful.  You may use a stool softener, such as Colace (over the counter) 100 mg twice a day.  Use MiraLax (over the counter) for constipation as needed.   Diet - low sodium heart healthy   Complete by: As directed    Discharge instructions   Complete by: As directed    You may shower, dressing is waterproof.  Do not remove the dressing, we will remove it at your first post-op appointment.  Do not take a bath or soak the knee in a tub or pool.  You may weightbear as you can tolerate on the operative leg with a walker.  Continue using the CPM machine 3 times per day for one hour each time, increasing the degrees of range of motion daily.  Use the ice machine as needed, for no longer than 30 minutes at a time; best to use after the CPM machine to help with pain after range of motion.  Use the blue cradle boot under your heel to work on getting your leg straight.  Do NOT put a pillow under your knee.  You will follow-up with Dr. Marlou Sa in the clinic in 2 weeks at your given appointment date.  Call the office with any questions or concerns.   Dental Antibiotics:  In most cases prophylactic antibiotics for Dental procdeures after total joint surgery are not necessary.  Exceptions are as follows:  1. History of prior total joint infection  2. Severely immunocompromised (Organ Transplant, cancer chemotherapy, Rheumatoid biologic meds such as [place])  3. Poorly controlled diabetes (A1C &gt; 8.0, blood glucose over 200)  If you have one of these conditions, contact your surgeon for an antibiotic prescription, prior to your dental procedure.   Increase activity slowly as tolerated   Complete by: As directed        Follow-up Information    [facility] Go on 01/21/2021.   Why:  post hospital follow up appointment scheduled for 01/21/2021  at 10:45 am  Contact  information: [facility] Follow up.   Why: [facility] to provide home health PT services, start of care to begin within 48 hrs post discharge.  [phone]               Signed: Gerrianne Scale Magnant 01/14/2021, 9:12 PM

## 2021-01-14 NOTE — Telephone Encounter (Signed)
It looks like she had knee surgery done at the beginning of the month. Medication request should be directed to ortho.   Phill Myron, D.O. Primary Care at [facility]  01/14/2021, 2:40 PM

## 2021-01-16 ENCOUNTER — Telehealth: Payer: Self-pay | Admitting: Hematology and Oncology

## 2021-01-16 NOTE — Telephone Encounter (Signed)
R/s 3/23 appt due to provider pal. Called and left detailed msg. Mailed printout

## 2021-01-21 ENCOUNTER — Ambulatory Visit (INDEPENDENT_AMBULATORY_CARE_PROVIDER_SITE_OTHER): Payer: PRIVATE HEALTH INSURANCE

## 2021-01-21 ENCOUNTER — Ambulatory Visit (INDEPENDENT_AMBULATORY_CARE_PROVIDER_SITE_OTHER): Payer: PRIVATE HEALTH INSURANCE | Admitting: Orthopedic Surgery

## 2021-01-21 ENCOUNTER — Other Ambulatory Visit: Payer: Self-pay

## 2021-01-21 DIAGNOSIS — Z96651 Presence of right artificial knee joint: Secondary | ICD-10-CM | POA: Diagnosis not present

## 2021-01-22 ENCOUNTER — Telehealth: Payer: Self-pay | Admitting: Orthopedic Surgery

## 2021-01-22 ENCOUNTER — Other Ambulatory Visit: Payer: Self-pay | Admitting: Surgical

## 2021-01-22 MED ORDER — OXYCODONE-ACETAMINOPHEN 5-325 MG PO TABS: 1.0000 | ORAL_TABLET | Freq: Three times a day (TID) | ORAL | 0 refills | Status: DC | PRN

## 2021-01-22 NOTE — Telephone Encounter (Signed)
Sent into [facility] on [address], sorry for delay

## 2021-01-22 NOTE — Telephone Encounter (Signed)
Pt called and would like to know if you're going to send the oxycodone in for her? She said Lurena Joiner was suppose to send it in!

## 2021-01-22 NOTE — Telephone Encounter (Signed)
Called pt and informed

## 2021-01-24 ENCOUNTER — Encounter: Payer: Self-pay | Admitting: Orthopedic Surgery

## 2021-01-24 NOTE — Progress Notes (Signed)
Post-Op Visit Note   Patient: [name]           Date of Birth: 29-Sep-1971           MRN: [id number] Visit Date: 01/21/2021 PCP: Nicolette Bang, DO   Assessment & Plan:  Chief Complaint:  Chief Complaint  Patient presents with  . Right Knee - Routine Post Op   Visit Diagnoses:  1. Status post total right knee replacement     Plan: Patient is a 50 year old female who presents s/p right knee total knee arthroplasty on 01/06/2021.  She is currently at 95 degrees on the CPM machine and is using this 3 times per day for 1 hour each session.  She is getting home health physical therapy with her last session on Monday.  She is ambulating with walker and feels that she is doing well and progressing since surgery.  She takes aspirin twice a day for DVT prophylaxis.  She is taking Dilaudid 3 times per day.  Plan to transition from Dilaudid to oxycodone for pain control.  She denies any shortness of breath or chest pain.  No tenderness throughout the calf.  On exam she has 0 to 5 degrees of extension and about 60 degrees of flexion.  Encouraged her to continue working hard on her flexion with the CPM machine and with therapy exercises.  She is able to perform straight leg raise without extensor lag.  Incision is healing well without any evidence of infection or dehiscence.  Plan to start outpatient physical therapy upstairs.  Check back in 3 weeks for clinical recheck on her range of motion.  Follow-Up Instructions: No follow-ups on file.   Orders:  Orders Placed This Encounter  Procedures  . XR Knee 1-2 Views Right  . Ambulatory referral to Physical Therapy   No orders of the defined types were placed in this encounter.   Imaging: No results found.  PMFS History: Patient Active Problem List   Diagnosis Date Noted  . Arthritis of right knee   . S/P knee replacement 01/06/2021  . Morning headache 06/02/2017  . Leiomyoma of body of uterus 12/24/2016  . Acute bronchitis  11/11/2016  . Prediabetes 11/11/2016  . Pap smear abnormality of cervix with ASCUS favoring benign 01/01/2016  . COPD (chronic obstructive pulmonary disease) ([place]) 11/18/2015  . GERD (gastroesophageal reflux disease) 11/18/2015  . Abnormal imaging of thyroid 11/04/2015  . Elevated d-dimer 11/01/2015  . IDA (iron deficiency anemia) 09/04/2015  . 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 S/P cervical spinal fusion 03/06/2014  . Obesity 02/22/2014  . Radicular pain in right arm 01/11/2014  . 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 Other and unspecified hyperlipidemia 08/24/2013  . History of CVA (cerebrovascular accident) 05/18/2013  . 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 Anemia   . Anxiety   . Arthritis    right knee, back  . COPD (chronic obstructive pulmonary disease) ([place])   . GERD (gastroesophageal reflux disease)   . Headache(784.0)   . History of blood transfusion    Hx; of in 1991 after delivery  . History of TIAs   . Numbness    Right hand  . Pneumonia   . Stroke [facility]) 05/2013    Family History  Problem Relation Age of Onset  . Renal Disease Father        dialysis  . Hypertension Father   . Heart disease Mother   .  Heart disease Maternal Grandfather   . Asthma Sister   . Asthma Maternal Aunt     Past Surgical History:  Procedure Laterality Date  . ANTERIOR CERVICAL DECOMP/DISCECTOMY FUSION N/A 03/06/2014   Procedure: ANTERIOR CERVICAL DECOMPRESSION/DISCECTOMY FUSION 2 LEVELS four/five, five/six;  Surgeon: Eustace Moore, MD;  Location: [facility] NEURO ORS;  Service: Neurosurgery;  Laterality: N/A;  . CESAREAN SECTION     x 1 with 5th pregnancy  . CHOLECYSTECTOMY    . KNEE ARTHROSCOPY Right 2015  . TEE WITHOUT CARDIOVERSION N/A 05/21/2013   Procedure: TRANSESOPHAGEAL ECHOCARDIOGRAM (TEE);  Surgeon: Sueanne Margarita, MD;  Location: [place] [facility];  Service: Cardiovascular;  Laterality: N/A;  . TOTAL KNEE ARTHROPLASTY Right  01/06/2021  . TOTAL KNEE ARTHROPLASTY Right 01/06/2021   Procedure: RIGHT TOTAL KNEE ARTHROPLASTY;  Surgeon: Meredith Pel, MD;  Location: [place];  Service: Orthopedics;  Laterality: Right;  . TUBAL LIGATION     Social History   Occupational History  . Occupation: CNA    Employer: [employer]  Tobacco Use  . Smoking status: Former Smoker    Packs/day: 0.50    Years: 26.00    Pack years: 13.00    Types: Cigarettes    Quit date: 10/09/2015    Years since quitting: 5.2  . Smokeless tobacco: Former Network engineer  . Vaping Use: Never used  Substance and Sexual Activity  . Alcohol use: Not Currently    Alcohol/week: 0.0 standard drinks    Comment: occ  . Drug use: No  . Sexual activity: Not on file

## 2021-01-25 ENCOUNTER — Encounter: Payer: Self-pay | Admitting: Orthopedic Surgery

## 2021-01-26 ENCOUNTER — Telehealth (INDEPENDENT_AMBULATORY_CARE_PROVIDER_SITE_OTHER): Payer: PRIVATE HEALTH INSURANCE | Admitting: Internal Medicine

## 2021-01-26 ENCOUNTER — Telehealth: Payer: PRIVATE HEALTH INSURANCE | Admitting: Internal Medicine

## 2021-01-26 ENCOUNTER — Encounter: Payer: Self-pay | Admitting: Internal Medicine

## 2021-01-26 DIAGNOSIS — Z09 Encounter for follow-up examination after completed treatment for conditions other than malignant neoplasm: Secondary | ICD-10-CM

## 2021-01-26 DIAGNOSIS — Z96651 Presence of right artificial knee joint: Secondary | ICD-10-CM | POA: Diagnosis not present

## 2021-01-26 NOTE — Progress Notes (Signed)
Virtual Visit via Telephone Note  I connected with [name], on 01/26/2021 at 3:32 PM by telephone due to the COVID-19 pandemic and verified that I am speaking with the correct person using two identifiers.   Consent: I discussed the limitations, risks, security and privacy concerns of performing an evaluation and management service by telephone and the availability of in person appointments. I also discussed with the patient that there may be a patient responsible charge related to this service. The patient expressed understanding and agreed to proceed.   Location of Patient: Home   Location of Provider: Clinic    Persons participating in Telemedicine visit: Scottie Stanish Dr. Juleen China      History of Present Illness: Patient has a visit for hospital follow up. Was hospitalized from 3/1 to 3/2 for right total knee replacement for arthritis of right knee. Saw orthopedics for follow up on 3/16. Not able to flex as far as should be at this point post-op. Was receiving home health PT. Will start outpatient PT this Wednesday. Her pain is fairly manageable. Worst when she wakes up.    Past Medical History:  Diagnosis Date  . Anemia   . Anxiety   . Arthritis    right knee, back  . COPD (chronic obstructive pulmonary disease) ([place])   . GERD (gastroesophageal reflux disease)   . Headache(784.0)   . History of blood transfusion    Hx; of in 1991 after delivery  . History of TIAs   . Numbness    Right hand  . Pneumonia   . Stroke [facility]) 05/2013   Allergies  Allergen Reactions  . Shellfish Allergy Itching and Swelling  . Amoxicillin Other (See Comments)    Yeast infection  . Latex Itching and Rash    Current Outpatient Medications on File Prior to Visit  Medication Sig Dispense Refill  . aspirin 81 MG chewable tablet Chew 1 tablet (81 mg total) by mouth 2 (two) times daily. 60 tablet 0  . ferrous sulfate 325 (65 FE) MG tablet Take 1 tablet (325 mg total)  by mouth every other day. 90 tablet 0  . fluconazole (DIFLUCAN) 150 MG tablet Take 1 tablet (150 mg total) by mouth every three (3) days as needed. 3 tablet 0  . FLUoxetine (PROZAC) 20 MG capsule Take 1 capsule (20 mg total) by mouth daily. 90 capsule 1  . gabapentin (NEURONTIN) 300 MG capsule Take 1 capsule (300 mg total) by mouth 3 (three) times daily. 30 capsule 0  . methocarbamol (ROBAXIN) 500 MG tablet Take 1 tablet (500 mg total) by mouth every 8 (eight) hours as needed for muscle spasms. 30 tablet 0  . omeprazole (PRILOSEC) 20 MG capsule Take 20 mg by mouth daily.    . ondansetron (ZOFRAN) 4 MG tablet Take 1 tablet (4 mg total) by mouth every 8 (eight) hours as needed for nausea. 20 tablet 0  . oxyCODONE-acetaminophen (PERCOCET) 5-325 MG tablet Take 1 tablet by mouth every 8 (eight) hours as needed for severe pain. 30 tablet 0  . ferumoxytol (FERAHEME) 510 MG/17ML SOLN injection Inject 17 mLs (510 mg total) into the vein once for 1 dose. 17 mL 0   No current facility-administered medications on file prior to visit.    Observations/Objective: NAD. Speaking clearly.  Work of breathing normal.  Alert and oriented. Mood appropriate.   Assessment and Plan: 1. Hospital discharge follow-up Reviewed hospital course, current medications, ensured proper f/u in place, and addressed concerns.  2. Status post right knee replacement Followed by orthopedics and PT.    Follow Up Instructions: Routine medical care    I discussed the assessment and treatment plan with the patient. The patient was provided an opportunity to ask questions and all were answered. The patient agreed with the plan and demonstrated an understanding of the instructions.   The patient was advised to call back or seek an in-person evaluation if the symptoms worsen or if the condition fails to improve as anticipated.     I provided 7 minutes total of non-face-to-face time during this encounter including median  intraservice time, reviewing previous notes, investigations, ordering medications, medical decision making, coordinating care and patient verbalized understanding at the end of the visit.    Phill Myron, D.O. [facility]  01/26/2021, 3:32 PM

## 2021-01-28 ENCOUNTER — Ambulatory Visit (INDEPENDENT_AMBULATORY_CARE_PROVIDER_SITE_OTHER): Payer: PRIVATE HEALTH INSURANCE | Admitting: Physical Therapy

## 2021-01-28 ENCOUNTER — Encounter: Payer: Self-pay | Admitting: Physical Therapy

## 2021-01-28 ENCOUNTER — Other Ambulatory Visit: Payer: Self-pay

## 2021-01-28 ENCOUNTER — Other Ambulatory Visit: Payer: PRIVATE HEALTH INSURANCE

## 2021-01-28 ENCOUNTER — Ambulatory Visit: Payer: PRIVATE HEALTH INSURANCE | Admitting: Hematology and Oncology

## 2021-01-28 DIAGNOSIS — M25561 Pain in right knee: Secondary | ICD-10-CM

## 2021-01-28 DIAGNOSIS — R2689 Other abnormalities of gait and mobility: Secondary | ICD-10-CM

## 2021-01-28 DIAGNOSIS — M6281 Muscle weakness (generalized): Secondary | ICD-10-CM | POA: Diagnosis not present

## 2021-01-28 DIAGNOSIS — M25661 Stiffness of right knee, not elsewhere classified: Secondary | ICD-10-CM

## 2021-01-28 DIAGNOSIS — R600 Localized edema: Secondary | ICD-10-CM

## 2021-01-28 DIAGNOSIS — R2681 Unsteadiness on feet: Secondary | ICD-10-CM

## 2021-01-28 NOTE — Patient Instructions (Signed)
Access Code: [email or web address] URL: [email or web address] Date: 01/28/2021 Prepared by: Jamey Reas  Exercises Quad Setting and Stretching - 2-4 x daily - 7 x weekly - 5-10 sets - 10 reps - prop 5-10 minutes & quad set5 seconds hold Supine Heel Slide with Strap - 2-3 x daily - 7 x weekly - 2-3 sets - 10 reps - 5 seconds hold Supine Heel Slide with Small Ball - 2-3 x daily - 7 x weekly - 2-3 sets - 10 reps - 5 seconds hold Supine Straight Leg Raises - 2-3 x daily - 7 x weekly - 2-3 sets - 10 reps - 5 seconds hold Seated Knee Flexion Extension AROM - 2-4 x daily - 7 x weekly - 2-3 sets - 10 reps - 5 seconds hold Seated straight leg lifts - 2-3 x daily - 7 x weekly - 2-3 sets - 10 reps - 5 seconds hold Seated Hamstring Stretch with Strap - 2-4 x daily - 7 x weekly - 1 sets - 3 reps - 20-30 seconds hold Standing Gastroc Stretch - 2-4 x daily - 7 x weekly - 1 sets - 3 reps - 20-30 seconds hold
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
## 2021-01-30 ENCOUNTER — Other Ambulatory Visit: Payer: Self-pay | Admitting: Surgical

## 2021-01-30 ENCOUNTER — Encounter: Payer: Self-pay | Admitting: Physical Therapy

## 2021-01-30 ENCOUNTER — Ambulatory Visit (INDEPENDENT_AMBULATORY_CARE_PROVIDER_SITE_OTHER): Payer: PRIVATE HEALTH INSURANCE | Admitting: Physical Therapy

## 2021-01-30 ENCOUNTER — Other Ambulatory Visit: Payer: Self-pay

## 2021-01-30 ENCOUNTER — Telehealth: Payer: Self-pay

## 2021-01-30 DIAGNOSIS — R600 Localized edema: Secondary | ICD-10-CM | POA: Diagnosis not present

## 2021-01-30 DIAGNOSIS — M25661 Stiffness of right knee, not elsewhere classified: Secondary | ICD-10-CM

## 2021-01-30 DIAGNOSIS — M6281 Muscle weakness (generalized): Secondary | ICD-10-CM

## 2021-01-30 DIAGNOSIS — M25561 Pain in right knee: Secondary | ICD-10-CM | POA: Diagnosis not present

## 2021-01-30 DIAGNOSIS — R2689 Other abnormalities of gait and mobility: Secondary | ICD-10-CM

## 2021-01-30 DIAGNOSIS — R2681 Unsteadiness on feet: Secondary | ICD-10-CM

## 2021-01-30 MED ORDER — OXYCODONE-ACETAMINOPHEN 5-325 MG PO TABS
1.0000 | ORAL_TABLET | Freq: Two times a day (BID) | ORAL | 0 refills | Status: DC | PRN
Start: 2021-01-30 — End: 2021-02-26

## 2021-01-30 MED ORDER — METHOCARBAMOL 500 MG PO TABS: 500.0000 mg | ORAL_TABLET | Freq: Three times a day (TID) | ORAL | 0 refills | Status: DC | PRN

## 2021-01-30 NOTE — Therapy (Signed)
[facility] Therapy [address] [place], [place], [address] Phone: [phone]   Fax:  [phone]  Physical Therapy Treatment  Patient Details  Name: [name] MRN: [id number] Date of Birth: 1971-05-10 Referring Provider (PT): Meredith Pel, MD   Encounter Date: 01/30/2021   PT End of Session - 01/30/21 1011    Visit Number 2    Number of Visits 17    Date for PT Re-Evaluation 03/20/21    Authorization Type MEDCOST EFFECTIVE 01/27/2017 AND ACTIVE  COVERED AT 100%, 30 PT VISITS PER CALENDER YEAR/REF #[id number]    Authorization - Visit Number 2    Authorization - Number of Visits 30    PT Start Time 0920    PT Stop Time 1010    PT Time Calculation (min) 50 min    Activity Tolerance Patient tolerated treatment well;Patient limited by fatigue;Patient limited by pain    Behavior During Therapy [facility] for tasks assessed/performed           Past Medical History:  Diagnosis Date  . Anemia   . Anxiety   . Arthritis    right knee, back  . COPD (chronic obstructive pulmonary disease) ([place])   . GERD (gastroesophageal reflux disease)   . Headache(784.0)   . History of blood transfusion    Hx; of in 1991 after delivery  . History of TIAs   . Numbness    Right hand  . Pneumonia   . Stroke [facility]) 05/2013    Past Surgical History:  Procedure Laterality Date  . ANTERIOR CERVICAL DECOMP/DISCECTOMY FUSION N/A 03/06/2014   Procedure: ANTERIOR CERVICAL DECOMPRESSION/DISCECTOMY FUSION 2 LEVELS four/five, five/six;  Surgeon: Eustace Moore, MD;  Location: [facility] NEURO ORS;  Service: Neurosurgery;  Laterality: N/A;  . CESAREAN SECTION     x 1 with 5th pregnancy  . CHOLECYSTECTOMY    . KNEE ARTHROSCOPY Right 2015  . TEE WITHOUT CARDIOVERSION N/A 05/21/2013   Procedure: TRANSESOPHAGEAL ECHOCARDIOGRAM (TEE);  Surgeon: Sueanne Margarita, MD;  Location: [place];  Service: Cardiovascular;  Laterality: N/A;  . TOTAL KNEE ARTHROPLASTY Right 01/06/2021  .  TOTAL KNEE ARTHROPLASTY Right 01/06/2021   Procedure: RIGHT TOTAL KNEE ARTHROPLASTY;  Surgeon: Meredith Pel, MD;  Location: [place];  Service: Orthopedics;  Laterality: Right;  . TUBAL LIGATION      There were no vitals filed for this visit.   Subjective Assessment - 01/30/21 0923    Subjective knee is feeling a little tighter today, trying to do exercises    Pertinent History arthritis, COPD, obesity, CVA ~7-49yrs ago, anterior cervial decompression / discentomy C4-6 03/06/2014    Limitations Lifting;Walking;House hold activities;Standing    Patient Stated Goals walk longer & without problems, return work, return to shopping    Currently in Pain? Yes    Pain Score 5     Pain Location Knee    Pain Orientation Right;Anterior    Pain Descriptors / Indicators Aching;Tightness    Pain Type Acute pain;Surgical pain    Pain Onset 1 to 4 weeks ago    Pain Frequency Intermittent    Aggravating Factors  bending knee, lying on side    Pain Relieving Factors meds, ice, heating pad              [facility] PT Assessment - 01/30/21 0947      Assessment   Medical Diagnosis Z96.651 (ICD-10-CM) - Status post total right knee replacement    Referring Provider (PT) Meredith Pel, MD  PROM   Right Knee Flexion 92                         [facility] Adult PT Treatment/Exercise - 01/30/21 0924      Ambulation/Gait   Ambulation/Gait Yes    Ambulation/Gait Assistance 5: Supervision    Ambulation Distance (Feet) 30 Feet    Assistive device Straight cane   with quad tip   Gait Pattern Step-through pattern;Decreased stance time - right;Decreased step length - left;Decreased hip/knee flexion - right    Ambulation Surface Level;Indoor      Exercises   Exercises Knee/Hip      Knee/Hip Exercises: Stretches   Passive Hamstring Stretch Right;3 reps;20 seconds    Passive Hamstring Stretch Limitations seated with strap for calf stretch    Knee: Self-Stretch to increase Flexion 5  reps;10 seconds    Knee: Self-Stretch Limitations seated active heel slide      Knee/Hip Exercises: Aerobic   Nustep L5 x 8 min      Knee/Hip Exercises: Seated   Other Seated Knee/Hip Exercises SLR Rt x 10 reps      Knee/Hip Exercises: Supine   Quad Sets Right;10 reps    Heel Slides AAROM;Right    Straight Leg Raises Right;10 reps      Knee/Hip Exercises: Sidelying   Hip ABduction Right;10 reps      Knee/Hip Exercises: Prone   Hamstring Curl 10 reps   Rt   Straight Leg Raises Right;10 reps      Vasopneumatic   Number Minutes Vasopneumatic  10 minutes    Vasopnuematic Location  Knee    Vasopneumatic Pressure Medium    Vasopneumatic Temperature  34                    PT Short Term Goals - 01/30/21 1011      PT SHORT TERM GOAL #1   Title Patient demo & verbalizes understanding of HEP for initial 4 weeks of PT.    Time 4    Period Weeks    Status On-going    Target Date 02/20/21      PT SHORT TERM GOAL #2   Title PROM right knee 0* to 90*    Time 4    Period Weeks    Status Achieved    Target Date 02/20/21             PT Long Term Goals - 01/30/21 1011      PT LONG TERM GOAL #1   Title FOTO >/= 62% functional    Time 8    Period Weeks    Status On-going    Target Date 03/20/21      PT LONG TERM GOAL #2   Title Patient reports pain in right knee </= 1/10 with functional activities    Time 8    Period Weeks    Status On-going      PT LONG TERM GOAL #3   Title PROM right knee 0* - 100*    Time 8    Period Weeks    Status On-going      PT LONG TERM GOAL #4   Title AROM seated LAQ right knee 0* and standing knee flexion 80*    Time 8    Period Weeks    Status On-going      PT LONG TERM GOAL #5   Title Patient ambulates >500' without assistive device & negotiates ramps, curbs &  stairs single rail independently.    Time 8    Period Weeks    Status On-going                 Plan - 01/30/21 1011    Clinical Impression Statement  Pt demonstrating steady progress with PT and AAROM measured 0-92 deg today meeting STG #2.  Removed heel slide with ball today as she reports that is causing increased pain but kept AA heelslides which she is tolerating better.  Will continue to benefit from PT to maximize function.    Personal Factors and Comorbidities Comorbidity 3+;Past/Current Experience    Comorbidities arthritis, COPD, obesity, CVA ~7-68yrs ago, anterior cervial decompression / discentomy C4-6 03/06/2014    Examination-Activity Limitations Caring for Others;Lift;Locomotion Level;Sleep;Squat;Stairs;Stand    Examination-Participation Restrictions Community Activity;Occupation;Shop    Stability/Clinical Decision Making Stable/Uncomplicated    Rehab Potential Good    PT Frequency 2x / week    PT Duration 8 weeks    PT Treatment/Interventions ADLs/Self Care Home Management;Electrical Stimulation;Moist Heat;DME Instruction;Gait training;Stair training;Functional mobility training;Therapeutic activities;Therapeutic exercise;Balance training;Neuromuscular re-education;Patient/family education;Manual techniques;Scar mobilization;Passive range of motion;Vasopneumatic Device;Joint Manipulations    PT Next Visit Plan manual therapy & therapeutic exercise to increase range right knee, strengthening, end with vaso    PT Home Exercise Plan Access Code: [email or web address]           Patient will benefit from skilled therapeutic intervention in order to improve the following deficits and impairments:  Decreased activity tolerance,Decreased balance,Decreased mobility,Decreased range of motion,Decreased skin integrity,Decreased scar mobility,Decreased strength,Increased edema,Impaired flexibility,Pain  Visit Diagnosis: Acute pain of right knee  Localized edema  Stiffness of right knee, not elsewhere classified  Muscle weakness (generalized)  Other abnormalities of gait and mobility  Unsteadiness on feet     Problem List Patient Active  Problem List   Diagnosis Date Noted  . Arthritis of right knee   . S/P knee replacement 01/06/2021  . Morning headache 06/02/2017  . Leiomyoma of body of uterus 12/24/2016  . Acute bronchitis 11/11/2016  . Prediabetes 11/11/2016  . Pap smear abnormality of cervix with ASCUS favoring benign 01/01/2016  . COPD (chronic obstructive pulmonary disease) ([place]) 11/18/2015  . GERD (gastroesophageal reflux disease) 11/18/2015  . Abnormal imaging of thyroid 11/04/2015  . Elevated d-dimer 11/01/2015  . IDA (iron deficiency anemia) 09/04/2015  . 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 S/P cervical spinal fusion 03/06/2014  . Obesity 02/22/2014  . Radicular pain in right arm 01/11/2014  . 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 Other and unspecified hyperlipidemia 08/24/2013  . History of CVA (cerebrovascular accident) 05/18/2013  . Tobacco abuse, 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      Laureen Abrahams, PT, DPT 01/30/21 10:14 AM    [facility] Physical Therapy [address] [place], [place], [address] Phone: [phone]   Fax:  [phone]  Name: [name] MRN: [id number] Date of Birth: [birth year]

## 2021-01-30 NOTE — Telephone Encounter (Signed)
Patient came into the office she is requesting rx refill for oxycodone and for methocarbamol call back:[phone]

## 2021-01-30 NOTE — Telephone Encounter (Signed)
Please advise 

## 2021-01-30 NOTE — Telephone Encounter (Signed)
Sent in meds 

## 2021-02-04 ENCOUNTER — Other Ambulatory Visit: Payer: Self-pay

## 2021-02-04 ENCOUNTER — Telehealth: Payer: Self-pay | Admitting: Hematology and Oncology

## 2021-02-04 ENCOUNTER — Ambulatory Visit (INDEPENDENT_AMBULATORY_CARE_PROVIDER_SITE_OTHER): Payer: PRIVATE HEALTH INSURANCE | Admitting: Physical Therapy

## 2021-02-04 ENCOUNTER — Encounter: Payer: Self-pay | Admitting: Physical Therapy

## 2021-02-04 DIAGNOSIS — M25561 Pain in right knee: Secondary | ICD-10-CM

## 2021-02-04 DIAGNOSIS — R600 Localized edema: Secondary | ICD-10-CM | POA: Diagnosis not present

## 2021-02-04 DIAGNOSIS — M6281 Muscle weakness (generalized): Secondary | ICD-10-CM

## 2021-02-04 DIAGNOSIS — M25661 Stiffness of right knee, not elsewhere classified: Secondary | ICD-10-CM

## 2021-02-04 DIAGNOSIS — R2689 Other abnormalities of gait and mobility: Secondary | ICD-10-CM

## 2021-02-04 DIAGNOSIS — R2681 Unsteadiness on feet: Secondary | ICD-10-CM

## 2021-02-04 NOTE — Therapy (Signed)
[facility] Physical Therapy [address] [place], [place], [address] Phone: [phone]   Fax:  [phone]  Physical Therapy Treatment  Patient Details  Name: [name] MRN: [id number] Date of Birth: Dec 01, 1970 Referring Provider (PT): Meredith Pel, MD   Encounter Date: 02/04/2021   PT End of Session - 02/04/21 0939    Visit Number 3    Number of Visits 17    Date for PT Re-Evaluation 03/20/21    Authorization Type MEDCOST EFFECTIVE 01/27/2017 AND ACTIVE  COVERED AT 100%, 30 PT VISITS PER CALENDER YEAR/REF #[id number]    Authorization - Visit Number 3    Authorization - Number of Visits 30    PT Start Time 0930    PT Stop Time 1015    PT Time Calculation (min) 45 min    Activity Tolerance Patient tolerated treatment well;Patient limited by fatigue;Patient limited by pain    Behavior During Therapy  [facility] for tasks assessed/performed           Past Medical History:  Diagnosis Date  . Anemia   . Anxiety   . Arthritis    right knee, back  . COPD (chronic obstructive pulmonary disease) ([place])   . GERD (gastroesophageal reflux disease)   . Headache(784.0)   . History of blood transfusion    Hx; of in 1991 after delivery  . History of TIAs   . Numbness    Right hand  . Pneumonia   . Stroke [facility]) 05/2013    Past Surgical History:  Procedure Laterality Date  . ANTERIOR CERVICAL DECOMP/DISCECTOMY FUSION N/A 03/06/2014   Procedure: ANTERIOR CERVICAL DECOMPRESSION/DISCECTOMY FUSION 2 LEVELS four/five, five/six;  Surgeon: Eustace Moore, MD;  Location: [facility] NEURO ORS;  Service: Neurosurgery;  Laterality: N/A;  . CESAREAN SECTION     x 1 with 5th pregnancy  . CHOLECYSTECTOMY    . KNEE ARTHROSCOPY Right 2015  . TEE WITHOUT CARDIOVERSION N/A 05/21/2013   Procedure: TRANSESOPHAGEAL ECHOCARDIOGRAM (TEE);  Surgeon: Sueanne Margarita, MD;  Location: [place];  Service: Cardiovascular;  Laterality: N/A;  . TOTAL KNEE ARTHROPLASTY Right 01/06/2021  .  TOTAL KNEE ARTHROPLASTY Right 01/06/2021   Procedure: RIGHT TOTAL KNEE ARTHROPLASTY;  Surgeon: Meredith Pel, MD;  Location: [place];  Service: Orthopedics;  Laterality: Right;  . TUBAL LIGATION      There were no vitals filed for this visit.   Subjective Assessment - 02/04/21 0933    Subjective Yesterday was bad day.  she [place] go [place] & did a lot of stairs.  She continues to have issues with sleeping. She has been using pillows in bed as PT recommended and it helps.    Pertinent History arthritis, COPD, obesity, CVA ~7-32yrs ago, anterior cervial decompression / discentomy C4-6 03/06/2014    Limitations Lifting;Walking;House hold activities;Standing    Patient Stated Goals walk longer & without problems, return work, return to shopping    Currently in Pain? Yes    Pain Score 4    Since last PT appt, ranged from 0/10 to 8/10   Pain Location Knee    Pain Orientation Right;Anterior    Pain Descriptors / Indicators Aching;Tightness;Throbbing    Pain Type Surgical pain;Acute pain    Pain Onset 1 to 4 weeks ago    Pain Frequency Intermittent    Aggravating Factors  stairs, being on it    Pain Relieving Factors sitting, meds, ice or heating pad.    Effect of Pain on Daily Activities limits standing.  [facility] PT Assessment - 02/04/21 0930      Assessment   Medical Diagnosis Z96.651 (ICD-10-CM) - Status post total right knee replacement    Referring Provider (PT) Meredith Pel, MD      PROM   Right Knee Extension 0    Right Knee Flexion 94                         [facility] Adult PT Treatment/Exercise - 02/04/21 0930      Ambulation/Gait   Ambulation/Gait Yes    Ambulation/Gait Assistance 5: Supervision    Ambulation Distance (Feet) 100 Feet    Assistive device Straight cane   with quad tip   Gait Pattern Step-through pattern;Decreased stance time - right;Decreased step length - left;Decreased hip/knee flexion - right    Curb 5: Supervision    cane   Curb Details (indicate cue type and reason) PT verbal & demo cues on modified technique. Pt return demo understanding.      Self-Care   Self-Care Heat/Ice Application;ADL's    ADL's using 24" bar stool to modifying standing ADLs    Heat/Ice Application ice massage demo & verbal cues.  pt verbalized understanding.      Exercises   Exercises Knee/Hip      Knee/Hip Exercises: Stretches   Passive Hamstring Stretch Right;3 reps;20 seconds    Passive Hamstring Stretch Limitations seated with strap for calf stretch    Knee: Self-Stretch to increase Flexion 5 reps;10 seconds    Knee: Self-Stretch Limitations seated active heel slide      Knee/Hip Exercises: Aerobic   Nustep L5 x 8 min seat 7      Knee/Hip Exercises: Standing   Forward Step Up Right;Left;5 reps;Hand Hold: 2;Step Height: 6"    Forward Step Up Limitations PT demo & verbal cues on proper technique    Step Down Right;Both;1 set;5 reps;Hand Hold: 2;Step Height: 6"    Step Down Limitations PT demo & verbal cues on proper technique      Knee/Hip Exercises: Seated   Knee/Hip Flexion knee flexion with LLE assisting 15 sec hold 10 reps.    Other Seated Knee/Hip Exercises SLR Rt x 10 reps      Knee/Hip Exercises: Supine   Quad Sets Right;10 reps    Heel Slides AAROM;Right;1 set;10 reps    Heel Slides Limitations 55cm ball & strap    Bridges Strengthening;Both;1 set;10 reps    Straight Leg Raises Right;10 reps      Knee/Hip Exercises: Sidelying   Hip ABduction Right;10 reps      Knee/Hip Exercises: Prone   Hamstring Curl 10 reps   Rt   Straight Leg Raises Right;10 reps      Vasopneumatic   Number Minutes Vasopneumatic  10 minutes    Vasopnuematic Location  Knee    Vasopneumatic Pressure Medium    Vasopneumatic Temperature  34                    PT Short Term Goals - 01/30/21 1011      PT SHORT TERM GOAL #1   Title Patient demo & verbalizes understanding of HEP for initial 4 weeks of PT.    Time 4     Period Weeks    Status On-going    Target Date 02/20/21      PT SHORT TERM GOAL #2   Title PROM right knee 0* to 90*    Time 4  Period Weeks    Status Achieved    Target Date 02/20/21             PT Long Term Goals - 02/04/21 1630      PT LONG TERM GOAL #1   Title FOTO >/= 62% functional    Time 8    Period Weeks    Status On-going    Target Date 03/20/21      PT LONG TERM GOAL #2   Title Patient reports pain in right knee </= 1/10 with functional activities    Time 8    Period Weeks    Status On-going    Target Date 03/20/21      PT LONG TERM GOAL #3   Title PROM right knee 0* - 100*    Time 8    Period Weeks    Status On-going    Target Date 03/20/21      PT LONG TERM GOAL #4   Title AROM seated LAQ right knee 0* and standing knee flexion 80*    Time 8    Period Weeks    Status On-going    Target Date 03/20/21      PT LONG TERM GOAL #5   Title Patient ambulates >500' without assistive device & negotiates ramps, curbs & stairs single rail independently.    Time 8    Period Weeks    Status On-going    Target Date 03/20/21                 Plan - 02/04/21 0737    Clinical Impression Statement PT session focused on functional strength & range.  PT also educated pt on ice massage for home edema & pain management.  PT instructed in proper technique for step ups/step downs and progressed to curb with cane.    Personal Factors and Comorbidities Comorbidity 3+;Past/Current Experience    Comorbidities arthritis, COPD, obesity, CVA ~7-107yrs ago, anterior cervial decompression / discentomy C4-6 03/06/2014    Examination-Activity Limitations Caring for Others;Lift;Locomotion Level;Sleep;Squat;Stairs;Stand    Examination-Participation Restrictions Community Activity;Occupation;Shop    Stability/Clinical Decision Making Stable/Uncomplicated    Rehab Potential Good    PT Frequency 2x / week    PT Duration 8 weeks    PT Treatment/Interventions ADLs/Self Care  Home Management;Electrical Stimulation;Moist Heat;DME Instruction;Gait training;Stair training;Functional mobility training;Therapeutic activities;Therapeutic exercise;Balance training;Neuromuscular re-education;Patient/family education;Manual techniques;Scar mobilization;Passive range of motion;Vasopneumatic Device;Joint Manipulations    PT Next Visit Plan manual therapy & therapeutic exercise to increase range right knee, strengthening, end with vaso    PT Home Exercise Plan Access Code: [email or web address]           Patient will benefit from skilled therapeutic intervention in order to improve the following deficits and impairments:  Decreased activity tolerance,Decreased balance,Decreased mobility,Decreased range of motion,Decreased skin integrity,Decreased scar mobility,Decreased strength,Increased edema,Impaired flexibility,Pain  Visit Diagnosis: Acute pain of right knee  Localized edema  Stiffness of right knee, not elsewhere classified  Muscle weakness (generalized)  Other abnormalities of gait and mobility  Unsteadiness on feet     Problem List Patient Active Problem List   Diagnosis Date Noted  . Arthritis of right knee   . S/P knee replacement 01/06/2021  . Morning headache 06/02/2017  . Leiomyoma of body of uterus 12/24/2016  . Acute bronchitis 11/11/2016  . Prediabetes 11/11/2016  . Pap smear abnormality of cervix with ASCUS favoring benign 01/01/2016  . COPD (chronic obstructive pulmonary disease) ([place]) 11/18/2015  . GERD (gastroesophageal reflux disease) 11/18/2015  .  Abnormal imaging of thyroid 11/04/2015  . Elevated d-dimer 11/01/2015  . IDA (iron deficiency anemia) 09/04/2015  . 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 S/P cervical spinal fusion 03/06/2014  . Obesity 02/22/2014  . Radicular pain in right arm 01/11/2014  . 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 Other and unspecified hyperlipidemia 08/24/2013  . History of CVA (cerebrovascular accident)  05/18/2013  . Tobacco abuse, 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    Jamey Reas, PT, DPT 02/04/2021, 4:35 PM  [facility] Physical Therapy [address] [place], [place], [address] Phone: [phone]   Fax:  [phone]  Name: [name] MRN: [id number] Date of Birth: September 22, 1971

## 2021-02-04 NOTE — Telephone Encounter (Signed)
Called pt several times, no answer and vm is full. Will attempt to call pt again later today to r/s appt per 3/30 sch msg.

## 2021-02-04 NOTE — Telephone Encounter (Signed)
R/s appts per 3/30 sch msg. Pt aware.  

## 2021-02-05 ENCOUNTER — Telehealth: Payer: PRIVATE HEALTH INSURANCE | Admitting: Physician Assistant

## 2021-02-05 ENCOUNTER — Ambulatory Visit: Payer: PRIVATE HEALTH INSURANCE | Admitting: Hematology and Oncology

## 2021-02-05 ENCOUNTER — Other Ambulatory Visit: Payer: PRIVATE HEALTH INSURANCE

## 2021-02-05 DIAGNOSIS — J441 Chronic obstructive pulmonary disease with (acute) exacerbation: Secondary | ICD-10-CM

## 2021-02-05 DIAGNOSIS — R1111 Vomiting without nausea: Secondary | ICD-10-CM | POA: Diagnosis not present

## 2021-02-05 DIAGNOSIS — F4321 Adjustment disorder with depressed mood: Secondary | ICD-10-CM

## 2021-02-05 DIAGNOSIS — R112 Nausea with vomiting, unspecified: Secondary | ICD-10-CM

## 2021-02-05 DIAGNOSIS — G4700 Insomnia, unspecified: Secondary | ICD-10-CM

## 2021-02-05 LAB — POC COVID19 BINAXNOW: SARS Coronavirus 2 Ag: NEGATIVE

## 2021-02-05 MED ORDER — HYDROXYZINE HCL 25 MG PO TABS: ORAL_TABLET | ORAL | 0 refills | Status: DC

## 2021-02-05 MED ORDER — ALBUTEROL SULFATE HFA 108 (90 BASE) MCG/ACT IN AERS: 2.0000 | INHALATION_SPRAY | Freq: Four times a day (QID) | RESPIRATORY_TRACT | 0 refills | Status: DC | PRN

## 2021-02-05 MED ORDER — PREDNISONE 10 MG PO TABS: ORAL_TABLET | ORAL | 0 refills | Status: AC

## 2021-02-05 MED ORDER — AZITHROMYCIN 250 MG PO TABS: ORAL_TABLET | ORAL | 0 refills | Status: DC

## 2021-02-05 NOTE — Progress Notes (Signed)
Patient verified DOB Patient symptoms began all week. Patient shares symptoms are worse at night. Patient shares a scratchy throat, patient denies fevers at home. Patient reports 1 hr ago she vomited. Patient complains of last diarrhea occurring this morning. Patient denies any exposure or any household members experiencing similar symptoms. Patient

## 2021-02-05 NOTE — Patient Instructions (Addendum)
Your flu test was negative   Your rapid COVID test was negative  You will take azithromycin, a steroid taper, and use albuterol inhaler every 6 hours as needed.  I have started a referral for you to be seen by pulmonology.  I have entered an order for you to have a chest x-ray completed.  For your insomnia, I would encourage you to try hydroxyzine 25 to 50 mg at bedtime.   Again I am very sorry for your loss.  I hope that you feel better soon, please let us know if there is any else we can do for you   Kennieth Rad, PA-C Physician Assistant [facility] [email or web address]    Chronic Obstructive Pulmonary Disease  Chronic obstructive pulmonary disease (COPD) is a long-term (chronic) condition that affects the lungs. COPD is a general term that can be used to describe many different lung problems that cause lung swelling (inflammation) and limit airflow, including chronic bronchitis and emphysema. If you have COPD, your lung function will probably never return to normal. In most cases, it gets worse over time. However, there are steps you can take to slow the progression of the disease and improve your quality of life. What are the causes? This condition may be caused by:  Smoking. This is the most common cause.  Certain genes passed down through families. What increases the risk? The following factors may make you more likely to develop this condition:  Secondhand smoke from cigarettes, pipes, or cigars.  Exposure to chemicals and other irritants such as fumes and dust in the work environment.  Chronic lung conditions or infections. What are the signs or symptoms? Symptoms of this condition include:  Shortness of breath, especially during physical activity.  Chronic cough with a large amount of thick mucus. Sometimes the cough may not have any mucus (dry cough).  Wheezing.  Rapid breaths.  Gray or bluish  discoloration (cyanosis) of the skin, especially in your fingers, toes, or lips.  Feeling tired (fatigue).  Weight loss.  Chest tightness.  Frequent infections.  Episodes when breathing symptoms become much worse (exacerbations).  Swelling in the ankles, feet, or legs. This may occur in later stages of the disease. How is this diagnosed? This condition is diagnosed based on:  Your medical history.  A physical exam. You may also have tests, including:  Lung (pulmonary) function tests. This may include a spirometry test, which measures your ability to exhale properly.  Chest X-ray.  CT scan.  Blood tests. How is this treated? This condition may be treated with:  Medicines. These may include inhaled rescue medicines to treat acute exacerbations as well as long-term, or maintenance, medicines to prevent flare-ups of COPD. ? Bronchodilators help treat COPD by dilating the airways to allow increased airflow and make your breathing more comfortable. ? Steroids can reduce airway inflammation and help prevent exacerbations.  Smoking cessation. If you smoke, your health care provider may ask you to quit, and may also recommend therapy or replacement products to help you quit.  Pulmonary rehabilitation. This may involve working with a team of health care providers and specialists, such as respiratory, occupational, and physical therapists.  Exercise and physical activity. These are beneficial for nearly all people with COPD.  Nutrition therapy to gain weight, if you are underweight.  Oxygen. Supplemental oxygen therapy is only helpful if you have a low oxygen level in your blood (hypoxemia).  Lung surgery or transplant.  Palliative care. This is to help  people with COPD feel comfortable when treatment is no longer working. Follow these instructions at home: Medicines  Take over-the-counter and prescription medicines (inhaled or pills) only as told by your health care  provider.  Talk to your health care provider before taking any cough or allergy medicines. You may need to avoid certain medicines that dry out your airways. Lifestyle  If you are a smoker, the most important thing that you can do is to stop smoking. Do not use any products that contain nicotine or tobacco, such as cigarettes and e-cigarettes. If you need help quitting, ask your health care provider. Continuing to smoke will cause the disease to progress faster.  Avoid exposure to things that irritate your lungs, such as smoke, chemicals, and fumes.  Stay active, but balance activity with periods of rest. Exercise and physical activity will help you maintain your ability to do things you want to do.  Learn and use relaxation techniques to manage stress and to control your breathing.  Get the right amount of sleep and get quality sleep. Most adults need 7 or more hours per night.  Eat healthy foods. Eating smaller, more frequent meals and resting before meals may help you maintain your strength. Controlled breathing Learn and use controlled breathing techniques as directed by your health care provider. Controlled breathing techniques include:  Pursed lip breathing. Start by breathing in (inhaling) through your nose for 1 second. Then, purse your lips as if you were going to whistle and breathe out (exhale) through the pursed lips for 2 seconds.  Diaphragmatic breathing. Start by putting one hand on your abdomen just above your waist. Inhale slowly through your nose. The hand on your abdomen should move out. Then purse your lips and exhale slowly. You should be able to feel the hand on your abdomen moving in as you exhale. Controlled coughing Learn and use controlled coughing to clear mucus from your lungs. Controlled coughing is a series of short, progressive coughs. The steps of controlled coughing are: 1. Lean your head slightly forward. 2. Breathe in deeply using diaphragmatic  breathing. 3. Try to hold your breath for 3 seconds. 4. Keep your mouth slightly open while coughing twice. 5. Spit any mucus out into a tissue. 6. Rest and repeat the steps once or twice as needed. General instructions  Make sure you receive all the vaccines that your health care provider recommends, especially the pneumococcal and influenza vaccines. Preventing infection and hospitalization is very important when you have COPD.  Use oxygen therapy and pulmonary rehabilitation if directed to by your health care provider. If you require home oxygen therapy, ask your health care provider whether you should purchase a pulse oximeter to measure your oxygen level at home.  Work with your health care provider to develop a COPD action plan. This will help you know what steps to take if your condition gets worse.  Keep other chronic health conditions under control as told by your health care provider.  Avoid extreme temperature and humidity changes.  Avoid contact with people who have an illness that spreads from person to person (is contagious), such as viral infections or pneumonia.  Keep all follow-up visits as told by your health care provider. This is important. Contact a health care provider if:  You are coughing up more mucus than usual.  There is a change in the color or thickness of your mucus.  Your breathing is more labored than usual.  Your breathing is faster than usual.  You have difficulty sleeping.  You need to use your rescue medicines or inhalers more often than expected.  You have trouble doing routine activities such as getting dressed or walking around the house. Get help right away if:  You have shortness of breath while you are resting.  You have shortness of breath that prevents you from: ? Being able to talk. ? Performing your usual physical activities.  You have chest pain lasting longer than 5 minutes.  Your skin color is more blue (cyanotic) than  usual.  You measure low oxygen saturations for longer than 5 minutes with a pulse oximeter.  You have a fever.  You feel too tired to breathe normally. Summary  Chronic obstructive pulmonary disease (COPD) is a long-term (chronic) condition that affects the lungs.  Your lung function will probably never return to normal. In most cases, it gets worse over time. However, there are steps you can take to slow the progression of the disease and improve your quality of life.  Treatment for COPD may include taking medicines, quitting smoking, pulmonary rehabilitation, and changes to diet and exercise. As the disease progresses, you may need oxygen therapy, a lung transplant, or palliative care.  To help manage your condition, do not smoke, avoid exposure to things that irritate your lungs, stay up to date on all vaccines, and follow your health care provider's instructions for taking medicines. This information is not intended to replace advice given to you by your health care provider. Make sure you discuss any questions you have with your health care provider. Document Revised: 10/07/2017 Document Reviewed: 11/29/2016 Elsevier Patient Education  2021 [employer].

## 2021-02-05 NOTE — Progress Notes (Addendum)
Established Patient Office Visit  Subjective:  Patient ID: [name], female    DOB: Nov 26, 1970  Age: 50 y.o. MRN: [id number]  CC:  Chief Complaint  Patient presents with  . Cough   Virtual Visit via Telephone Note  I connected with [name] on 02/05/21 at  4:00 PM EDT by telephone and verified that I am speaking with the correct person using two identifiers.  Location: Patient: [name]: [facility] Medicine Unit    I discussed the limitations, risks, security and privacy concerns of performing an evaluation and management service by telephone and the availability of in person appointments. I also discussed with the patient that there may be a patient responsible charge related to this service. The patient expressed understanding and agreed to proceed.   History of Present Illness: Reports that she has been having a productive cough with white phlegm, shortness of breath, and wheezing for the past week.  Reports that the cough is keeping her awake at night, states that when the coughing spells become intense she will have episodes of vomiting.  Reports that she is vomiting white phlegm, occasionally she will vomit food.  Reports that she did have one episode of diarrhea this morning.  No measured fevers at home.  No sick contacts.  Reports that she has tried Flonase, Robitussin, Best boy without relief.  Reports that she was treated for upper respiratory infection at the beginning of January, states that she did have some relief after that treatment, but states the cough did return.  Reports that she was previously told she had COPD, reports that she stopped smoking 6 years ago, states that she smoked for 29 years, approximately a half a pack a day.  Denies any formal COPD evaluations, does not use a daily inhaler.  Reports recent knee surgery, is still taking opioid pain medication.  Reports her husband was killed 2 weeks ago, states that she has not  been able to sleep due to this as well as the cough.  States that she has been taking Tylenol pm and Benadryl without relief.  Previously failed trazodone    Observations/Objective: Medical history and current medications reviewed, no physical exam completed    Past Medical History:  Diagnosis Date  . Anemia   . Anxiety   . Arthritis    right knee, back  . COPD (chronic obstructive pulmonary disease) ([place])   . GERD (gastroesophageal reflux disease)   . Headache(784.0)   . History of blood transfusion    Hx; of in 1991 after delivery  . History of TIAs   . Numbness    Right hand  . Pneumonia   . Stroke [facility]) 05/2013    Past Surgical History:  Procedure Laterality Date  . ANTERIOR CERVICAL DECOMP/DISCECTOMY FUSION N/A 03/06/2014   Procedure: ANTERIOR CERVICAL DECOMPRESSION/DISCECTOMY FUSION 2 LEVELS four/five, five/six;  Surgeon: Eustace Moore, MD;  Location: [facility] NEURO ORS;  Service: Neurosurgery;  Laterality: N/A;  . CESAREAN SECTION     x 1 with 5th pregnancy  . CHOLECYSTECTOMY    . KNEE ARTHROSCOPY Right 2015  . TEE WITHOUT CARDIOVERSION N/A 05/21/2013   Procedure: TRANSESOPHAGEAL ECHOCARDIOGRAM (TEE);  Surgeon: Sueanne Margarita, MD;  Location: [place];  Service: Cardiovascular;  Laterality: N/A;  . TOTAL KNEE ARTHROPLASTY Right 01/06/2021  . TOTAL KNEE ARTHROPLASTY Right 01/06/2021   Procedure: RIGHT TOTAL KNEE ARTHROPLASTY;  Surgeon: Meredith Pel, MD;  Location: [place];  Service: Orthopedics;  Laterality: Right;  .  TUBAL LIGATION      Family History  Problem Relation Age of Onset  . Renal Disease Father        dialysis  . Hypertension Father   . Heart disease Mother   . Heart disease Maternal Grandfather   . Asthma Sister   . Asthma Maternal Aunt     Social History   Socioeconomic History  . Marital status: Divorced    Spouse name: Not on file  . Number of children: 7  . Years of education: GEd  . Highest education level: Not on file  Occupational  History  . Occupation: CNA    Employer: [employer]  Tobacco Use  . Smoking status: Former Smoker    Packs/day: 0.50    Years: 26.00    Pack years: 13.00    Types: Cigarettes    Quit date: 10/09/2015    Years since quitting: 5.3  . Smokeless tobacco: Former Network engineer  . Vaping Use: Never used  Substance and Sexual Activity  . Alcohol use: Not Currently    Alcohol/week: 0.0 standard drinks    Comment: occ  . Drug use: No  . Sexual activity: Not Currently  Other Topics Concern  . Not on file  Social History Narrative   Patient lives at home with her family    She has 7 children   5 grown and out of the house   2 at home 87 and 20   She works as a Quarry manager at [employer] Strain: Not on [employer] Insecurity: Not on file  Transportation Needs: Not on file  Physical Activity: Not on file  Stress: Not on file  Social Connections: Not on file  Intimate Partner Violence: Not on file    Outpatient Medications Prior to Visit  Medication Sig Dispense Refill  . aspirin 81 MG chewable tablet Chew 1 tablet (81 mg total) by mouth 2 (two) times daily. 60 tablet 0  . ferrous sulfate 325 (65 FE) MG tablet Take 1 tablet (325 mg total) by mouth every other day. 90 tablet 0  . FLUoxetine (PROZAC) 20 MG capsule Take 1 capsule (20 mg total) by mouth daily. 90 capsule 1  . gabapentin (NEURONTIN) 300 MG capsule Take 1 capsule (300 mg total) by mouth 3 (three) times daily. 30 capsule 0  . methocarbamol (ROBAXIN) 500 MG tablet Take 1 tablet (500 mg total) by mouth every 8 (eight) hours as needed for muscle spasms. 30 tablet 0  . omeprazole (PRILOSEC) 20 MG capsule Take 20 mg by mouth daily.    . ondansetron (ZOFRAN) 4 MG tablet Take 1 tablet (4 mg total) by mouth every 8 (eight) hours as needed for nausea. 20 tablet 0  . oxyCODONE-acetaminophen (PERCOCET) 5-325 MG tablet Take 1 tablet by mouth every 12 (twelve) hours as needed for  severe pain. 30 tablet 0  . ferumoxytol (FERAHEME) 510 MG/17ML SOLN injection Inject 17 mLs (510 mg total) into the vein once for 1 dose. (Patient not taking: Reported on 02/05/2021) 17 mL 0  . fluconazole (DIFLUCAN) 150 MG tablet Take 1 tablet (150 mg total) by mouth every three (3) days as needed. 3 tablet 0   No facility-administered medications prior to visit.    Allergies  Allergen Reactions  . Shellfish Allergy Itching and Swelling  . Amoxicillin Other (See Comments)    Yeast infection  . Latex Itching and Rash  ROS Review of Systems  Constitutional: Positive for fatigue. Negative for chills and fever.  HENT: Positive for congestion and postnasal drip. Negative for ear pain, sinus pressure, sore throat and trouble swallowing.   Eyes: Negative.   Respiratory: Positive for cough, shortness of breath and wheezing.   Cardiovascular: Negative for chest pain.  Gastrointestinal: Positive for diarrhea and vomiting. Negative for nausea.  Endocrine: Negative.   Genitourinary: Negative.   Musculoskeletal: Negative for myalgias.  Skin: Negative.   Allergic/Immunologic: Negative.   Neurological: Negative for headaches.  Hematological: Negative.   Psychiatric/Behavioral: Positive for dysphoric mood and sleep disturbance. Negative for self-injury and suicidal ideas.      Objective:     There were no vitals taken for this visit. Wt Readings from Last 3 Encounters:  01/06/21 191 lb (86.6 kg)  12/31/20 191 lb 14.4 oz (87 kg)  12/15/20 187 lb 9.6 oz (85.1 kg)     Health Maintenance Due  Topic Date Due  . COLONOSCOPY (Pts 45-26yrs Insurance coverage will need to be confirmed)  Never done  . MAMMOGRAM  Never done  . PAP SMEAR-Modifier  12/29/2020    There are no preventive care reminders to display for this patient.  Lab Results  Component Value Date   TSH 1.040 11/01/2015   Lab Results  Component Value Date   WBC 7.3 12/31/2020   HGB 12.4 12/31/2020   HCT 41.0  12/31/2020   MCV 82.2 12/31/2020   PLT 236 12/31/2020   Lab Results  Component Value Date   NA 142 12/31/2020   K 3.3 (L) 12/31/2020   CO2 27 12/31/2020   GLUCOSE 118 (H) 12/31/2020   BUN 12 12/31/2020   CREATININE 0.80 12/31/2020   BILITOT 0.4 11/27/2020   ALKPHOS 75 11/27/2020   AST 15 11/27/2020   ALT 8 11/27/2020   PROT 7.6 11/27/2020   ALBUMIN 3.9 11/27/2020   CALCIUM 8.6 (L) 12/31/2020   ANIONGAP 9 12/31/2020   Lab Results  Component Value Date   CHOL 212 (H) 02/12/2020   Lab Results  Component Value Date   HDL 69 02/12/2020   Lab Results  Component Value Date   LDLCALC 121 (H) 02/12/2020   Lab Results  Component Value Date   TRIG 111 02/12/2020   Lab Results  Component Value Date   CHOLHDL 3.1 02/12/2020   Lab Results  Component Value Date   HGBA1C 5.4 02/12/2020      Assessment & Plan:   Problem List Items Addressed This Visit   None   Visit Diagnoses    COPD exacerbation ([place])    -  Primary   Relevant Medications   azithromycin (ZITHROMAX) 250 MG tablet   predniSONE (DELTASONE) 10 MG tablet   albuterol (VENTOLIN HFA) 108 (90 Base) MCG/ACT inhaler   Other Relevant Orders   DG Chest 2 View   Ambulatory referral to Pulmonology   Insomnia, unspecified type       Relevant Medications   hydrOXYzine (ATARAX/VISTARIL) 25 MG tablet   Non-intractable vomiting without nausea, unspecified vomiting type       Relevant Orders   POC COVID-19 (Completed)   Influenza A/B   Grief          Assessment and Plan:  1. COPD exacerbation ([place] Rapid Covid testing negative, flu testing negative  Trial azithromycin, steroid taper, albuterol inhaler.  Continue supportive care, red flags given for prompt reevaluation - DG Chest 2 View; Future - azithromycin (ZITHROMAX) 250 MG tablet; Take 2 tabs  PO day 1, then take 1 tab PO once daily  Dispense: 6 tablet; Refill: 0 - predniSONE (DELTASONE) 10 MG tablet; Take 6 tablets (60 mg total) by mouth daily with  breakfast for 2 days, THEN 4 tablets (40 mg total) daily with breakfast for 2 days, THEN 2 tablets (20 mg total) daily with breakfast for 2 days, THEN 1 tablet (10 mg total) daily with breakfast for 2 days.  Dispense: 26 tablet; Refill: 0 - albuterol (VENTOLIN HFA) 108 (90 Base) MCG/ACT inhaler; Inhale 2 puffs into the lungs every 6 (six) hours as needed for wheezing or shortness of breath.  Dispense: 8 g; Refill: 0 - Ambulatory referral to Pulmonology  2. Insomnia, unspecified type Trial hydroxyzine 25 to 50 mg. - hydrOXYzine (ATARAX/VISTARIL) 25 MG tablet; Take 1 -2 tabs PO QHS PRN for insomnia  Dispense: 30 tablet; Refill: 0  3. Non-intractable vomiting without nausea, unspecified vomiting type - POC COVID-19 - Influenza A/B  4. Grief Patient declines CBT at time.  Patient encouraged to return to mobile unit in 2 weeks for follow-up  Follow Up Instructions:    I discussed the assessment and treatment plan with the patient. The patient was provided an opportunity to ask questions and all were answered. The patient agreed with the plan and demonstrated an understanding of the instructions.   The patient was advised to call back or seek an in-person evaluation if the symptoms worsen or if the condition fails to improve as anticipated.  I provided 21 minutes of non-face-to-face time during this encounter.    Meds ordered this encounter  Medications  . azithromycin (ZITHROMAX) 250 MG tablet    Sig: Take 2 tabs PO day 1, then take 1 tab PO once daily    Dispense:  6 tablet    Refill:  0    Order Specific Question:   Supervising Rowland    Answer:   Asencion Noble E [[id number]]  . predniSONE (DELTASONE) 10 MG tablet    Sig: Take 6 tablets (60 mg total) by mouth daily with breakfast for 2 days, THEN 4 tablets (40 mg total) daily with breakfast for 2 days, THEN 2 tablets (20 mg total) daily with breakfast for 2 days, THEN 1 tablet (10 mg total) daily with breakfast for 2 days.    Dispense:   26 tablet    Refill:  0    Order Specific Question:   Supervising Rowland    Answer:   Joya Gaskins, PATRICK E [[id number]]  . albuterol (VENTOLIN HFA) 108 (90 Base) MCG/ACT inhaler    Sig: Inhale 2 puffs into the lungs every 6 (six) hours as needed for wheezing or shortness of breath.    Dispense:  8 g    Refill:  0    Order Specific Question:   Supervising Rowland    Answer:   Asencion Noble E [[id number]]  . hydrOXYzine (ATARAX/VISTARIL) 25 MG tablet    Sig: Take 1 -2 tabs PO QHS PRN for insomnia    Dispense:  30 tablet    Refill:  0    Order Specific Question:   Supervising Rowland    Answer:   Elsie Stain [[id number]]    Follow-up: Return in about 2 weeks (around 02/19/2021).    Loraine Grip Mayers, PA-C

## 2021-02-06 ENCOUNTER — Encounter: Payer: PRIVATE HEALTH INSURANCE | Admitting: Rehabilitative and Restorative Service Providers"

## 2021-02-06 DIAGNOSIS — R1110 Vomiting, unspecified: Secondary | ICD-10-CM | POA: Insufficient documentation

## 2021-02-06 DIAGNOSIS — F4321 Adjustment disorder with depressed mood: Secondary | ICD-10-CM | POA: Insufficient documentation

## 2021-02-06 LAB — POCT INFLUENZA A/B
Influenza A, POC: NEGATIVE
Influenza B, POC: NEGATIVE

## 2021-02-09 ENCOUNTER — Inpatient Hospital Stay: Payer: PRIVATE HEALTH INSURANCE

## 2021-02-09 ENCOUNTER — Other Ambulatory Visit: Payer: Self-pay

## 2021-02-09 ENCOUNTER — Inpatient Hospital Stay: Payer: PRIVATE HEALTH INSURANCE | Attending: Hematology and Oncology | Admitting: Hematology and Oncology

## 2021-02-09 ENCOUNTER — Encounter: Payer: Self-pay | Admitting: Hematology and Oncology

## 2021-02-09 ENCOUNTER — Other Ambulatory Visit: Payer: Self-pay | Admitting: Hematology and Oncology

## 2021-02-09 VITALS — BP 180/95 | HR 62 | Temp 97.0°F | Resp 18 | Wt 193.5 lb

## 2021-02-09 DIAGNOSIS — N920 Excessive and frequent menstruation with regular cycle: Secondary | ICD-10-CM | POA: Insufficient documentation

## 2021-02-09 DIAGNOSIS — D500 Iron deficiency anemia secondary to blood loss (chronic): Secondary | ICD-10-CM | POA: Diagnosis not present

## 2021-02-09 DIAGNOSIS — I10 Essential (primary) hypertension: Secondary | ICD-10-CM | POA: Insufficient documentation

## 2021-02-09 DIAGNOSIS — D509 Iron deficiency anemia, unspecified: Secondary | ICD-10-CM | POA: Insufficient documentation

## 2021-02-09 LAB — CMP (CANCER CENTER ONLY)
ALT: 14 U/L (ref 0–44)
AST: 14 U/L — ABNORMAL LOW (ref 15–41)
Albumin: 3.8 g/dL (ref 3.5–5.0)
Alkaline Phosphatase: 104 U/L (ref 38–126)
Anion gap: 13 (ref 5–15)
BUN: 13 mg/dL (ref 6–20)
CO2: 28 mmol/L (ref 22–32)
Calcium: 8.4 mg/dL — ABNORMAL LOW (ref 8.9–10.3)
Chloride: 101 mmol/L (ref 98–111)
Creatinine: 0.91 mg/dL (ref 0.44–1.00)
GFR, Estimated: >60 mL/min (ref >60)
Glucose, Bld: 106 mg/dL — ABNORMAL HIGH (ref 70–99)
Potassium: 3.5 mmol/L (ref 3.5–5.1)
Sodium: 142 mmol/L (ref 135–145)
Total Bilirubin: 0.5 mg/dL (ref 0.3–1.2)
Total Protein: 7.5 g/dL (ref 6.5–8.1)

## 2021-02-09 LAB — CBC WITH DIFFERENTIAL (CANCER CENTER ONLY)
Abs Immature Granulocytes: 0.03 10*3/uL (ref 0.00–0.07)
Basophils Absolute: 0 10*3/uL (ref 0.0–0.1)
Basophils Relative: 0 %
Eosinophils Absolute: 0 10*3/uL (ref 0.0–0.5)
Eosinophils Relative: 0 %
HCT: 38.9 % (ref 36.0–46.0)
Hemoglobin: 12.2 g/dL (ref 12.0–15.0)
Immature Granulocytes: 1 %
Lymphocytes Relative: 24 %
Lymphs Abs: 1.6 10*3/uL (ref 0.7–4.0)
MCH: 25.5 pg — ABNORMAL LOW (ref 26.0–34.0)
MCHC: 31.4 g/dL (ref 30.0–36.0)
MCV: 81.4 fL (ref 80.0–100.0)
Monocytes Absolute: 0.4 10*3/uL (ref 0.1–1.0)
Monocytes Relative: 6 %
Neutro Abs: 4.6 10*3/uL (ref 1.7–7.7)
Neutrophils Relative %: 69 %
Platelet Count: 248 10*3/uL (ref 150–400)
RBC: 4.78 MIL/uL (ref 3.87–5.11)
RDW: 20 % — ABNORMAL HIGH (ref 11.5–15.5)
WBC Count: 6.7 10*3/uL (ref 4.0–10.5)
nRBC: 0 % (ref 0.0–0.2)

## 2021-02-09 LAB — RETIC PANEL
Immature Retic Fract: 22.7 % — ABNORMAL HIGH (ref 2.3–15.9)
RBC.: 4.84 MIL/uL (ref 3.87–5.11)
Retic Count, Absolute: 88.1 10*3/uL (ref 19.0–186.0)
Retic Ct Pct: 1.8 % (ref 0.4–3.1)
Reticulocyte Hemoglobin: 26.5 pg — ABNORMAL LOW (ref >27.9)

## 2021-02-09 NOTE — Progress Notes (Signed)
[place] [phone]   [phone]  PROGRESS NOTE  Patient Care Team: Nicolette Bang, DO as PCP - General (Family Medicine)  Hematological/Oncological History # Iron Deficiency Anemia 2/2 to GYN Bleeding 01/20/2018: WBC 7.6, Hgb 11.1, MCV 78.2, Plt 264 02/12/2020: WBC 7.8, Hgb 7.0, MCV 61.2, Plt 317 11/13/2020: WBC 6.7, Hgb 7.0, MCV 62.6, Plt 301 11/17/2020: Iron 14, TIBC 437, Ferritin 2, Iron saturation 3% 11/27/2020: establish care with Dr. Lorenso Courier   Interval History:  [name] 50 y.o. female with medical history significant for iron deficiency anemia who presents for a follow up visit. The patient's last visit was on 11/27/2020. In the interim since the last visit she received her 2nd dose of IV feraheme with another provider.  On exam today [name] that she feels quite well.  She notes that her cravings for ice has dissipated and that she had a nice boost in energy with the IV iron therapy.  She also notes that she has not been having any issues with shortness of breath or fatigue since she underwent the infusions.  She tolerated the infusions well without any side effects.  She reports she has been holding her iron pills as she does not wish to take them at the same time as the pain medications as this may worsen her constipation.  She notes that she is having pain in her knee is currently on steroid therapy due to a an episode of bronchitis.  On further discussion she has not yet established an OB/GYN in order to help address her menstrual cycles.  She reports that she did have her menstrual cycles this month and that it was not particularly heavy or severe.  Other than some discomfort in her knee she feels like she is recovering well.  She is doing just eat iron rich foods.  She denies any fevers, chills, sweats, nausea, vomiting or diarrhea.  A full 10 point ROS is listed below.   MEDICAL HISTORY:  Past Medical History:   Diagnosis Date  . Anemia   . Anxiety   . Arthritis    right knee, back  . COPD (chronic obstructive pulmonary disease) ([place])   . GERD (gastroesophageal reflux disease)   . Headache(784.0)   . History of blood transfusion    Hx; of in 1991 after delivery  . History of TIAs   . Numbness    Right hand  . Pneumonia   . Stroke [facility]) 05/2013    SURGICAL HISTORY: Past Surgical History:  Procedure Laterality Date  . ANTERIOR CERVICAL DECOMP/DISCECTOMY FUSION N/A 03/06/2014   Procedure: ANTERIOR CERVICAL DECOMPRESSION/DISCECTOMY FUSION 2 LEVELS four/five, five/six;  Surgeon: Eustace Moore, MD;  Location: [facility] NEURO ORS;  Service: Neurosurgery;  Laterality: N/A;  . CESAREAN SECTION     x 1 with 5th pregnancy  . CHOLECYSTECTOMY    . KNEE ARTHROSCOPY Right 2015  . TEE WITHOUT CARDIOVERSION N/A 05/21/2013   Procedure: TRANSESOPHAGEAL ECHOCARDIOGRAM (TEE);  Surgeon: Sueanne Margarita, MD;  Location: [place];  Service: Cardiovascular;  Laterality: N/A;  . TOTAL KNEE ARTHROPLASTY Right 01/06/2021  . TOTAL KNEE ARTHROPLASTY Right 01/06/2021   Procedure: RIGHT TOTAL KNEE ARTHROPLASTY;  Surgeon: Meredith Pel, MD;  Location: [place];  Service: Orthopedics;  Laterality: Right;  . TUBAL LIGATION      SOCIAL HISTORY: Social History   Socioeconomic History  . Marital status: Divorced    Spouse name: Not on file  . Number of children:  7  . Years of education: GEd  . Highest education level: Not on file  Occupational History  . Occupation: CNA    Employer: [employer]  Tobacco Use  . Smoking status: Former Smoker    Packs/day: 0.50    Years: 26.00    Pack years: 13.00    Types: Cigarettes    Quit date: 10/09/2015    Years since quitting: 5.3  . Smokeless tobacco: Former Network engineer  . Vaping Use: Never used  Substance and Sexual Activity  . Alcohol use: Not Currently    Alcohol/week: 0.0 standard drinks    Comment: occ  . Drug use: No  . Sexual activity: Not Currently   Other Topics Concern  . Not on file  Social History Narrative   Patient lives at home with her family    She has 7 children   5 grown and out of the house   2 at home 66 and 34   She works as a Quarry manager at [employer] Strain: Not on [employer] Insecurity: Not on file  Transportation Needs: Not on file  Physical Activity: Not on file  Stress: Not on file  Social Connections: Not on file  Intimate Partner Violence: Not on file    FAMILY HISTORY: Family History  Problem Relation Age of Onset  . Renal Disease Father        dialysis  . Hypertension Father   . Heart disease Mother   . Heart disease Maternal Grandfather   . Asthma Sister   . Asthma Maternal Aunt     ALLERGIES:  is allergic to shellfish allergy, amoxicillin, and latex.  MEDICATIONS:  Current Outpatient Medications  Medication Sig Dispense Refill  . albuterol (VENTOLIN HFA) 108 (90 Base) MCG/ACT inhaler Inhale 2 puffs into the lungs every 6 (six) hours as needed for wheezing or shortness of breath. 8 g 0  . aspirin 81 MG chewable tablet Chew 1 tablet (81 mg total) by mouth 2 (two) times daily. 60 tablet 0  . azithromycin (ZITHROMAX) 250 MG tablet Take 2 tabs PO day 1, then take 1 tab PO once daily 6 tablet 0  . ferrous sulfate 325 (65 FE) MG tablet Take 1 tablet (325 mg total) by mouth every other day. 90 tablet 0  . ferumoxytol (FERAHEME) 510 MG/17ML SOLN injection Inject 17 mLs (510 mg total) into the vein once for 1 dose. (Patient not taking: Reported on 02/05/2021) 17 mL 0  . FLUoxetine (PROZAC) 20 MG capsule Take 1 capsule (20 mg total) by mouth daily. 90 capsule 1  . gabapentin (NEURONTIN) 300 MG capsule Take 1 capsule (300 mg total) by mouth 3 (three) times daily. 30 capsule 0  . hydrOXYzine (ATARAX/VISTARIL) 25 MG tablet Take 1 -2 tabs PO QHS PRN for insomnia 30 tablet 0  . methocarbamol (ROBAXIN) 500 MG tablet Take 1 tablet (500 mg total) by mouth every  8 (eight) hours as needed for muscle spasms. 30 tablet 0  . omeprazole (PRILOSEC) 20 MG capsule Take 20 mg by mouth daily.    . ondansetron (ZOFRAN) 4 MG tablet Take 1 tablet (4 mg total) by mouth every 8 (eight) hours as needed for nausea. 20 tablet 0  . oxyCODONE-acetaminophen (PERCOCET) 5-325 MG tablet Take 1 tablet by mouth every 12 (twelve) hours as needed for severe pain. 30 tablet 0  . predniSONE (DELTASONE) 10 MG tablet Take 6 tablets (60  mg total) by mouth daily with breakfast for 2 days, THEN 4 tablets (40 mg total) daily with breakfast for 2 days, THEN 2 tablets (20 mg total) daily with breakfast for 2 days, THEN 1 tablet (10 mg total) daily with breakfast for 2 days. 26 tablet 0   No current facility-administered medications for this visit.    REVIEW OF SYSTEMS:   Constitutional: ( - ) fevers, ( - )  chills , ( - ) night sweats Eyes: ( - ) blurriness of vision, ( - ) double vision, ( - ) watery eyes Ears, nose, mouth, throat, and face: ( - ) mucositis, ( - ) sore throat Respiratory: ( - ) cough, ( - ) dyspnea, ( - ) wheezes Cardiovascular: ( - ) palpitation, ( - ) chest discomfort, ( - ) lower extremity swelling Gastrointestinal:  ( - ) nausea, ( - ) heartburn, ( - ) change in bowel habits Skin: ( - ) abnormal skin rashes Lymphatics: ( - ) new lymphadenopathy, ( - ) easy bruising Neurological: ( - ) numbness, ( - ) tingling, ( - ) new weaknesses Behavioral/Psych: ( - ) mood change, ( - ) new changes  All other systems were reviewed with the patient and are negative.  PHYSICAL EXAMINATION:  Vitals:   02/09/21 1538  BP: (!) 180/95  Pulse: 62  Resp: 18  Temp: (!) 97 F (36.1 C)  SpO2: 97%   Filed Weights   02/09/21 1538  Weight: 193 lb 8 oz (87.8 kg)    GENERAL: well appearing middle aged [place] American female. alert, no distress and comfortable SKIN: skin color, texture, turgor are normal, no rashes or significant lesions EYES: conjunctiva are pink and  non-injected, sclera clear LUNGS: clear to auscultation and percussion with normal breathing effort HEART: regular rate & rhythm and no murmurs and no lower extremity edema Musculoskeletal: no cyanosis of digits and no clubbing  PSYCH: alert & oriented x 3, fluent speech NEURO: no focal motor/sensory deficits  LABORATORY DATA:  I have reviewed the data as listed CBC Latest Ref Rng & Units 02/09/2021 12/31/2020 12/15/2020  WBC 4.0 - 10.5 K/uL 6.7 7.3 5.3  Hemoglobin 12.0 - 15.0 g/dL 12.2 12.4 12.4  Hematocrit 36.0 - 46.0 % 38.9 41.0 40.5  Platelets 150 - 400 K/uL 248 236 308    CMP Latest Ref Rng & Units 12/31/2020 11/27/2020 11/13/2020  Glucose 70 - 99 mg/dL 118(H) 107(H) 93  BUN 6 - 20 mg/dL 12 13 11   Creatinine 0.44 - 1.00 mg/dL 0.80 0.97 0.81  Sodium 135 - 145 mmol/L 142 139 139  Potassium 3.5 - 5.1 mmol/L 3.3(L) 4.2 3.8  Chloride 98 - 111 mmol/L 106 105 107  CO2 22 - 32 mmol/L 27 26 24   Calcium 8.9 - 10.3 mg/dL 8.6(L) 8.8(L) 8.4(L)  Total Protein 6.5 - 8.1 g/dL - 7.6 -  Total Bilirubin 0.3 - 1.2 mg/dL - 0.4 -  Alkaline Phos 38 - 126 U/L - 75 -  AST 15 - 41 U/L - 15 -  ALT 0 - 44 U/L - 8 -    RADIOGRAPHIC STUDIES: XR Knee 1-2 Views Right  Result Date: 01/25/2021 AP and lateral views of right knee reviewed.  Right total knee prosthesis in excellent position and alignment without any complicating features.  No fracture noted.   ASSESSMENT & PLAN [name] 50 y.o. female with medical history significant for iron deficiency anemia who presents for a follow up visit.  After review the  labs, the records, and discussion with the patient the findings are most consistent with iron deficiency anemia secondary to GYN bleeding.  The patient has responded excellently to the IV iron therapy.  Her hemoglobin has returned to a nice robust level greater than 12.0.  She is not having any issues with symptoms.  Given these findings we will have the patient return in approximately 6 months time  in order to assure that she is maintaining these high levels of iron and that she is established with an OB/GYN practice.  # Iron Deficiency Anemia 2/2 to GYN Bleeding -- Findings are most consistent with chronic anemia secondary to GYN bleeding. -- Patient has tried iron in the past and found that she was unable to tolerate it due to severe constipation.  She did respond to her most recent IV iron therapy (Jan 2022) with a robust return of Hgb to >12.0.  -- completed IV Feraheme 510 mg q. 7 days x 2 doses on 12/01/2020. -- will re-evaluate the patients CBC and iron stores today --Encourage patient to establish with an OB/GYN practice to better control her heavy menstrual cycles. --RTC in 6 months or sooner if more IV iron is needed.  #Hypertension --Elevated above baseline today, likely secondary to recent steroid therapy as well as pain from the surgery. --Continue to follow with her primary care provider regarding this hypertension. --We will monitor at subsequent visits.  No orders of the defined types were placed in this encounter.   All questions were answered. The patient knows to call the clinic with any problems, questions or concerns.  A total of more than 30 minutes were spent on this encounter and over half of that time was spent on counseling and coordination of care as outlined above.   Ledell Peoples, MD Department of Hematology/Oncology [place] [facility] Phone: [phone] Pager: [phone] Email: [email or web address]  02/09/2021 3:58 PM

## 2021-02-10 ENCOUNTER — Ambulatory Visit (INDEPENDENT_AMBULATORY_CARE_PROVIDER_SITE_OTHER): Payer: PRIVATE HEALTH INSURANCE | Admitting: Physical Therapy

## 2021-02-10 ENCOUNTER — Telehealth: Payer: Self-pay | Admitting: Hematology and Oncology

## 2021-02-10 DIAGNOSIS — M6281 Muscle weakness (generalized): Secondary | ICD-10-CM | POA: Diagnosis not present

## 2021-02-10 DIAGNOSIS — M25661 Stiffness of right knee, not elsewhere classified: Secondary | ICD-10-CM | POA: Diagnosis not present

## 2021-02-10 DIAGNOSIS — R2681 Unsteadiness on feet: Secondary | ICD-10-CM

## 2021-02-10 DIAGNOSIS — M25561 Pain in right knee: Secondary | ICD-10-CM | POA: Diagnosis not present

## 2021-02-10 DIAGNOSIS — R600 Localized edema: Secondary | ICD-10-CM | POA: Diagnosis not present

## 2021-02-10 DIAGNOSIS — R2689 Other abnormalities of gait and mobility: Secondary | ICD-10-CM

## 2021-02-10 LAB — IRON AND TIBC
Iron: 33 ug/dL — ABNORMAL LOW (ref 41–142)
Saturation Ratios: 9 % — ABNORMAL LOW (ref 21–57)
TIBC: 386 ug/dL (ref 236–444)
UIBC: 353 ug/dL (ref 120–384)

## 2021-02-10 LAB — FERRITIN: Ferritin: 39 ng/mL (ref 11–307)

## 2021-02-10 NOTE — Therapy (Signed)
[facility] Physical Therapy [address] [place], [place], [address] Phone: [phone]   Fax:  [phone]  Physical Therapy Treatment  Patient Details  Name: [name] MRN: [id number] Date of Birth: February 28, 1971 Referring Provider (PT): Meredith Pel, MD   Encounter Date: 02/10/2021   PT End of Session - 02/10/21 1419    Visit Number 4    Number of Visits 17    Date for PT Re-Evaluation 03/20/21    Authorization Type MEDCOST EFFECTIVE 01/27/2017 AND ACTIVE  COVERED AT 100%, 30 PT VISITS PER CALENDER YEAR/REF #[id number]    Authorization - Visit Number 4    Authorization - Number of Visits 30    PT Start Time 9390    PT Stop Time 1430    PT Time Calculation (min) 45 min    Activity Tolerance Patient tolerated treatment well;Patient limited by fatigue;Patient limited by pain    Behavior During Therapy [facility] for tasks assessed/performed           Past Medical History:  Diagnosis Date  . Anemia   . Anxiety   . Arthritis    right knee, back  . COPD (chronic obstructive pulmonary disease) ([place])   . GERD (gastroesophageal reflux disease)   . Headache(784.0)   . History of blood transfusion    Hx; of in 1991 after delivery  . History of TIAs   . Numbness    Right hand  . Pneumonia   . Stroke [facility]) 05/2013    Past Surgical History:  Procedure Laterality Date  . ANTERIOR CERVICAL DECOMP/DISCECTOMY FUSION N/A 03/06/2014   Procedure: ANTERIOR CERVICAL DECOMPRESSION/DISCECTOMY FUSION 2 LEVELS four/five, five/six;  Surgeon: Eustace Moore, MD;  Location: [facility] NEURO ORS;  Service: Neurosurgery;  Laterality: N/A;  . CESAREAN SECTION     x 1 with 5th pregnancy  . CHOLECYSTECTOMY    . KNEE ARTHROSCOPY Right 2015  . TEE WITHOUT CARDIOVERSION N/A 05/21/2013   Procedure: TRANSESOPHAGEAL ECHOCARDIOGRAM (TEE);  Surgeon: Sueanne Margarita, MD;  Location: [place];  Service: Cardiovascular;  Laterality: N/A;  . TOTAL KNEE ARTHROPLASTY Right 01/06/2021  .  TOTAL KNEE ARTHROPLASTY Right 01/06/2021   Procedure: RIGHT TOTAL KNEE ARTHROPLASTY;  Surgeon: Meredith Pel, MD;  Location: [place];  Service: Orthopedics;  Laterality: Right;  . TUBAL LIGATION      There were no vitals filed for this visit.   Subjective Assessment - 02/10/21 1348    Subjective relays overall 4/10 pain in her Rt knee    Pertinent History arthritis, COPD, obesity, CVA ~7-67yrs ago, anterior cervial decompression / discentomy C4-6 03/06/2014    Limitations Lifting;Walking;House hold activities;Standing    Patient Stated Goals walk longer & without problems, return work, return to shopping    Pain Onset 1 to 4 weeks ago              [facility] PT Assessment - 02/10/21 0001      Assessment   Medical Diagnosis Z96.651 (ICD-10-CM) - Status post total right knee replacement    Referring Provider (PT) Meredith Pel, MD      PROM   Right Knee Extension 0    Right Knee Flexion 106            [facility] Adult PT Treatment/Exercise - 02/10/21 0001      Knee/Hip Exercises: Stretches   Passive Hamstring Stretch Right;3 reps;20 seconds    Passive Hamstring Stretch Limitations seated with strap for calf stretch    Quad Stretch Right;3 reps;20  seconds    Quad Stretch Limitations AAROM with strap, leg off EOB    Knee: Self-Stretch to increase Flexion 10 seconds   10 reps   Knee: Self-Stretch Limitations seated active heel slide      Knee/Hip Exercises: Aerobic   Nustep L5 x 8 min seat 6      Knee/Hip Exercises: Machines for Strengthening   Cybex Leg Press 75 lbs bilat 2X10, then Rt leg only 37 lbs 2X10      Knee/Hip Exercises: Standing   Lateral Step Up Limitations lateral step up/down X 5 reps bilat with bilat UE support on 6 inch    Forward Step Up Right;5 reps;Hand Hold: 1;Step Height: 6"    Step Down Right;5 reps;Hand Hold: 1;Step Height: 6"      Knee/Hip Exercises: Sidelying   Hip ABduction Right;2 sets;10 reps      Vasopneumatic   Number Minutes Vasopneumatic   10 minutes    Vasopnuematic Location  Knee    Vasopneumatic Pressure High    Vasopneumatic Temperature  34      Manual Therapy   Manual therapy comments Rt knee PROM, manual H.S. stretching, flexion mobs                    PT Short Term Goals - 01/30/21 1011      PT SHORT TERM GOAL #1   Title Patient demo & verbalizes understanding of HEP for initial 4 weeks of PT.    Time 4    Period Weeks    Status On-going    Target Date 02/20/21      PT SHORT TERM GOAL #2   Title PROM right knee 0* to 90*    Time 4    Period Weeks    Status Achieved    Target Date 02/20/21             PT Long Term Goals - 02/04/21 1630      PT LONG TERM GOAL #1   Title FOTO >/= 62% functional    Time 8    Period Weeks    Status On-going    Target Date 03/20/21      PT LONG TERM GOAL #2   Title Patient reports pain in right knee </= 1/10 with functional activities    Time 8    Period Weeks    Status On-going    Target Date 03/20/21      PT LONG TERM GOAL #3   Title PROM right knee 0* - 100*    Time 8    Period Weeks    Status On-going    Target Date 03/20/21      PT LONG TERM GOAL #4   Title AROM seated LAQ right knee 0* and standing knee flexion 80*    Time 8    Period Weeks    Status On-going    Target Date 03/20/21      PT LONG TERM GOAL #5   Title Patient ambulates >500' without assistive device & negotiates ramps, curbs & stairs single rail independently.    Time 8    Period Weeks    Status On-going    Target Date 03/20/21                 Plan - 02/10/21 1420    Clinical Impression Statement She showed big improvment in knee ROM noted today and is overall progressing well with PT. She had good tolerance to strength progression today and  she is now ambulating further.    Personal Factors and Comorbidities Comorbidity 3+;Past/Current Experience    Comorbidities arthritis, COPD, obesity, CVA ~7-68yrs ago, anterior cervial decompression / discentomy C4-6  03/06/2014    Examination-Activity Limitations Caring for Others;Lift;Locomotion Level;Sleep;Squat;Stairs;Stand    Examination-Participation Restrictions Community Activity;Occupation;Shop    Stability/Clinical Decision Making Stable/Uncomplicated    Rehab Potential Good    PT Frequency 2x / week    PT Duration 8 weeks    PT Treatment/Interventions ADLs/Self Care Home Management;Electrical Stimulation;Moist Heat;DME Instruction;Gait training;Stair training;Functional mobility training;Therapeutic activities;Therapeutic exercise;Balance training;Neuromuscular re-education;Patient/family education;Manual techniques;Scar mobilization;Passive range of motion;Vasopneumatic Device;Joint Manipulations    PT Next Visit Plan manual therapy & therapeutic exercise to increase range right knee, strengthening, end with vaso    PT Home Exercise Plan Access Code: [email or web address]           Patient will benefit from skilled therapeutic intervention in order to improve the following deficits and impairments:  Decreased activity tolerance,Decreased balance,Decreased mobility,Decreased range of motion,Decreased skin integrity,Decreased scar mobility,Decreased strength,Increased edema,Impaired flexibility,Pain  Visit Diagnosis: Acute pain of right knee  Localized edema  Stiffness of right knee, not elsewhere classified  Muscle weakness (generalized)  Other abnormalities of gait and mobility  Unsteadiness on feet     Problem List Patient Active Problem List   Diagnosis Date Noted  . Grief 02/06/2021  . Non-intractable vomiting 02/06/2021  . Arthritis of right knee   . S/P knee replacement 01/06/2021  . Morning headache 06/02/2017  . Leiomyoma of body of uterus 12/24/2016  . Acute bronchitis 11/11/2016  . Prediabetes 11/11/2016  . Pap smear abnormality of cervix with ASCUS favoring benign 01/01/2016  . COPD (chronic obstructive pulmonary disease) ([place]) 11/18/2015  . GERD (gastroesophageal reflux  disease) 11/18/2015  . Abnormal imaging of thyroid 11/04/2015  . COPD exacerbation ([place]) 11/01/2015  . Elevated d-dimer 11/01/2015  . IDA (iron deficiency anemia) 09/04/2015  . 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 Insomnia 04/09/2014  . S/P cervical spinal fusion 03/06/2014  . Obesity 02/22/2014  . Radicular pain in right arm 01/11/2014  . 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 Other and unspecified hyperlipidemia 08/24/2013  . History of CVA (cerebrovascular accident) 05/18/2013  . Tobacco abuse, 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    Debbe Odea, PT,DPT 02/10/2021, 2:21 PM  [facility] Physical Therapy [address] [place], [place], [address] Phone: [phone]   Fax:  [phone]  Name: [name] MRN: [id number] Date of Birth: 02-23-1971

## 2021-02-10 NOTE — Telephone Encounter (Signed)
Scheduled per los. Called and left msg. Mailed printout  °

## 2021-02-11 ENCOUNTER — Ambulatory Visit (INDEPENDENT_AMBULATORY_CARE_PROVIDER_SITE_OTHER): Payer: PRIVATE HEALTH INSURANCE | Admitting: Orthopedic Surgery

## 2021-02-11 ENCOUNTER — Other Ambulatory Visit: Payer: Self-pay

## 2021-02-11 DIAGNOSIS — Z96651 Presence of right artificial knee joint: Secondary | ICD-10-CM

## 2021-02-11 MED ORDER — ZOLPIDEM TARTRATE ER 6.25 MG PO TBCR: EXTENDED_RELEASE_TABLET | ORAL | 0 refills | Status: DC

## 2021-02-11 MED ORDER — HYDROCODONE-ACETAMINOPHEN 5-325 MG PO TABS: ORAL_TABLET | ORAL | 0 refills | Status: DC

## 2021-02-12 ENCOUNTER — Telehealth: Payer: Self-pay | Admitting: *Deleted

## 2021-02-12 NOTE — Telephone Encounter (Signed)
TCT patient regarding her recent lab results.  Spoke with patient and advised that her iron levels remain low. Dr. Lorenso Courier recommends she resume her oral iron therapy daily with a source of vitamin C. Advised that he will see her back in 3 months.  Scheduling message sent.

## 2021-02-12 NOTE — Telephone Encounter (Signed)
-----   Message from Orson Slick, MD sent at 02/10/2021  9:21 AM EDT ----- Please let Mrs. Harold know her iron levels are dropping. Encourage her to restart PO iron therapy. We will check her again in 3 months. If it continues to drop and she becomes anemic we can repeat IV iron.   ----- Message ----- From: Buel Ream, Lab In [place] Sent: 02/09/2021   3:31 PM EDT To: Orson Slick, MD

## 2021-02-13 ENCOUNTER — Ambulatory Visit (INDEPENDENT_AMBULATORY_CARE_PROVIDER_SITE_OTHER): Payer: PRIVATE HEALTH INSURANCE | Admitting: Physical Therapy

## 2021-02-13 ENCOUNTER — Other Ambulatory Visit: Payer: Self-pay

## 2021-02-13 DIAGNOSIS — M25561 Pain in right knee: Secondary | ICD-10-CM | POA: Diagnosis not present

## 2021-02-13 DIAGNOSIS — M6281 Muscle weakness (generalized): Secondary | ICD-10-CM | POA: Diagnosis not present

## 2021-02-13 DIAGNOSIS — M25661 Stiffness of right knee, not elsewhere classified: Secondary | ICD-10-CM | POA: Diagnosis not present

## 2021-02-13 DIAGNOSIS — R600 Localized edema: Secondary | ICD-10-CM | POA: Diagnosis not present

## 2021-02-13 DIAGNOSIS — R2689 Other abnormalities of gait and mobility: Secondary | ICD-10-CM

## 2021-02-13 NOTE — Therapy (Signed)
[facility] Physical Therapy [address] [place], [place], [address] Phone: [phone]   Fax:  [phone]  Physical Therapy Treatment  Patient Details  Name: [name] MRN: [id number] Date of Birth: [birth year] Referring Provider (PT): Meredith Pel, MD   Encounter Date: 02/13/2021   PT End of Session - 02/13/21 1420    Visit Number 5    Number of Visits 17    Date for PT Re-Evaluation 03/20/21    Authorization Type MEDCOST EFFECTIVE 01/27/2017 AND ACTIVE  COVERED AT 100%, 30 PT VISITS PER CALENDER YEAR/REF #[id number]    Authorization - Visit Number 5    Authorization - Number of Visits 30    PT Start Time 1031    PT Stop Time 1430    PT Time Calculation (min) 45 min    Activity Tolerance Patient tolerated treatment well;Patient limited by fatigue;Patient limited by pain    Behavior During Therapy [facility] for tasks assessed/performed           Past Medical History:  Diagnosis Date  . Anemia   . Anxiety   . Arthritis    right knee, back  . COPD (chronic obstructive pulmonary disease) ([place])   . GERD (gastroesophageal reflux disease)   . Headache(784.0)   . History of blood transfusion    Hx; of in 1991 after delivery  . History of TIAs   . Numbness    Right hand  . Pneumonia   . Stroke [facility]) 05/2013    Past Surgical History:  Procedure Laterality Date  . ANTERIOR CERVICAL DECOMP/DISCECTOMY FUSION N/A 03/06/2014   Procedure: ANTERIOR CERVICAL DECOMPRESSION/DISCECTOMY FUSION 2 LEVELS four/five, five/six;  Surgeon: Eustace Moore, MD;  Location: [facility] NEURO ORS;  Service: Neurosurgery;  Laterality: N/A;  . CESAREAN SECTION     x 1 with 5th pregnancy  . CHOLECYSTECTOMY    . KNEE ARTHROSCOPY Right 2015  . TEE WITHOUT CARDIOVERSION N/A 05/21/2013   Procedure: TRANSESOPHAGEAL ECHOCARDIOGRAM (TEE);  Surgeon: Sueanne Margarita, MD;  Location: [place];  Service: Cardiovascular;  Laterality: N/A;  . TOTAL KNEE ARTHROPLASTY Right 01/06/2021  .  TOTAL KNEE ARTHROPLASTY Right 01/06/2021   Procedure: RIGHT TOTAL KNEE ARTHROPLASTY;  Surgeon: Meredith Pel, MD;  Location: [place];  Service: Orthopedics;  Laterality: Right;  . TUBAL LIGATION      There were no vitals filed for this visit.   Subjective Assessment - 02/13/21 1351    Subjective relays overall 5/10 pain her thigh muscle on RLE    Pertinent History arthritis, COPD, obesity, CVA ~7-65yrs ago, anterior cervial decompression / discentomy C4-6 03/06/2014    Limitations Lifting;Walking;House hold activities;Standing    Patient Stated Goals walk longer & without problems, return work, return to shopping    Pain Onset 1 to 4 weeks ago             [facility] Adult PT Treatment/Exercise - 02/13/21 0001      Knee/Hip Exercises: Stretches   Passive Hamstring Stretch --    Passive Hamstring Stretch Limitations --    Sports administrator Right;3 reps;20 seconds    Quad Stretch Limitations AAROM with strap, leg off EOB    Knee: Self-Stretch to increase Flexion 10 seconds   10 reps   Knee: Self-Stretch Limitations seated active heel slide    Gastroc Stretch Both;2 reps;30 seconds    Gastroc Stretch Limitations slantboard      Knee/Hip Exercises: Aerobic   Nustep L5 x 8 min seat 5  Knee/Hip Exercises: Machines for Strengthening   Cybex Leg Press 75 lbs bilat 2X10, then Rt leg only 37 lbs 2X10      Knee/Hip Exercises: Standing   Lateral Step Up Limitations lateral step up/down X 5 reps bilat with bilat UE support on 6 inch    Forward Step Up Right;5 reps;Hand Hold: 1;Step Height: 6"    Step Down Right;Hand Hold: 1;Step Height: 6";10 reps      Knee/Hip Exercises: Sidelying   Hip ABduction --      Vasopneumatic   Number Minutes Vasopneumatic  10 minutes    Vasopnuematic Location  Knee    Vasopneumatic Pressure High    Vasopneumatic Temperature  34      Manual Therapy   Manual therapy comments Rt knee PROM, manual H.S. stretching, flexion mobs                    PT  Short Term Goals - 01/30/21 1011      PT SHORT TERM GOAL #1   Title Patient demo & verbalizes understanding of HEP for initial 4 weeks of PT.    Time 4    Period Weeks    Status On-going    Target Date 02/20/21      PT SHORT TERM GOAL #2   Title PROM right knee 0* to 90*    Time 4    Period Weeks    Status Achieved    Target Date 02/20/21             PT Long Term Goals - 02/04/21 1630      PT LONG TERM GOAL #1   Title FOTO >/= 62% functional    Time 8    Period Weeks    Status On-going    Target Date 03/20/21      PT LONG TERM GOAL #2   Title Patient reports pain in right knee </= 1/10 with functional activities    Time 8    Period Weeks    Status On-going    Target Date 03/20/21      PT LONG TERM GOAL #3   Title PROM right knee 0* - 100*    Time 8    Period Weeks    Status On-going    Target Date 03/20/21      PT LONG TERM GOAL #4   Title AROM seated LAQ right knee 0* and standing knee flexion 80*    Time 8    Period Weeks    Status On-going    Target Date 03/20/21      PT LONG TERM GOAL #5   Title Patient ambulates >500' without assistive device & negotiates ramps, curbs & stairs single rail independently.    Time 8    Period Weeks    Status On-going    Target Date 03/20/21                 Plan - 02/13/21 1422    Clinical Impression Statement She is making good progress thus far with Rt knee strength and ROM and steadily improves each week. We will continue to progress her as able.    Personal Factors and Comorbidities Comorbidity 3+;Past/Current Experience    Comorbidities arthritis, COPD, obesity, CVA ~7-91yrs ago, anterior cervial decompression / discentomy C4-6 03/06/2014    Examination-Activity Limitations Caring for Others;Lift;Locomotion Level;Sleep;Squat;Stairs;Stand    Examination-Participation Restrictions Community Activity;Occupation;Shop    Stability/Clinical Decision Making Stable/Uncomplicated    Rehab Potential Good  PT  Frequency 2x / week    PT Duration 8 weeks    PT Treatment/Interventions ADLs/Self Care Home Management;Electrical Stimulation;Moist Heat;DME Instruction;Gait training;Stair training;Functional mobility training;Therapeutic activities;Therapeutic exercise;Balance training;Neuromuscular re-education;Patient/family education;Manual techniques;Scar mobilization;Passive range of motion;Vasopneumatic Device;Joint Manipulations    PT Next Visit Plan manual therapy & therapeutic exercise to increase range right knee, strengthening, end with vaso    PT Home Exercise Plan Access Code: [email or web address]           Patient will benefit from skilled therapeutic intervention in order to improve the following deficits and impairments:  Decreased activity tolerance,Decreased balance,Decreased mobility,Decreased range of motion,Decreased skin integrity,Decreased scar mobility,Decreased strength,Increased edema,Impaired flexibility,Pain  Visit Diagnosis: Acute pain of right knee  Localized edema  Stiffness of right knee, not elsewhere classified  Muscle weakness (generalized)  Other abnormalities of gait and mobility     Problem List Patient Active Problem List   Diagnosis Date Noted  . Grief 02/06/2021  . Non-intractable vomiting 02/06/2021  . Arthritis of right knee   . S/P knee replacement 01/06/2021  . Morning headache 06/02/2017  . Leiomyoma of body of uterus 12/24/2016  . Acute bronchitis 11/11/2016  . Prediabetes 11/11/2016  . Pap smear abnormality of cervix with ASCUS favoring benign 01/01/2016  . COPD (chronic obstructive pulmonary disease) ([place]) 11/18/2015  . GERD (gastroesophageal reflux disease) 11/18/2015  . Abnormal imaging of thyroid 11/04/2015  . COPD exacerbation ([place]) 11/01/2015  . Elevated d-dimer 11/01/2015  . IDA (iron deficiency anemia) 09/04/2015  . 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 Insomnia 04/09/2014  . S/P cervical spinal fusion 03/06/2014  . Obesity 02/22/2014  .  Radicular pain in right arm 01/11/2014  . 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 Other and unspecified hyperlipidemia 08/24/2013  . History of CVA (cerebrovascular accident) 05/18/2013  . Tobacco abuse, 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    Silvestre Mesi 02/13/2021, 2:24 PM  [facility] Physical Therapy [address] [place], [place], [address] Phone: [phone]   Fax:  [phone]  Name: ALMER LITTLETON MRN: [id number] Date of Birth: [birth year]

## 2021-02-15 ENCOUNTER — Encounter: Payer: Self-pay | Admitting: Orthopedic Surgery

## 2021-02-15 NOTE — Progress Notes (Signed)
Post-Op Visit Note   Patient: [name]           Date of Birth: April 28, 1971           MRN: [id number] Visit Date: 02/11/2021 PCP: Nicolette Bang, DO   Assessment & Plan:  Chief Complaint:  Chief Complaint  Patient presents with  . Right Knee - Routine Post Op   Visit Diagnoses:  1. Status post total right knee replacement     Plan: Karlena is a 50 year old patient with right total knee replacement.  She is doing well.  Ambulating with a cane.  Does have pain at night.  Takes oxycodone about twice a day.  Works as a third Comptroller.  Walks about 2 hours per 8.  She would like to return to work in the near future.  On examination she has excellent range of motion from 0-100.  No calf tenderness with negative Homans.  At this time I Minna change her over to Norco to take 1 to 2/day max.  Ambien not to be taken with the Norco.  21 days only.  6-week return.  Okay to return to work in 3 weeks with no restriction.  Follow-Up Instructions: Return in about 6 weeks (around 03/25/2021).   Orders:  No orders of the defined types were placed in this encounter.  Meds ordered this encounter  Medications  . HYDROcodone-acetaminophen (NORCO/VICODIN) 5-325 MG tablet    Sig: 1 po bid prn pain    Dispense:  30 tablet    Refill:  0  . zolpidem (AMBIEN CR) 6.25 MG CR tablet    Sig: 1 po q d x 21 days prn    Dispense:  21 tablet    Refill:  0    Imaging: No results found.  PMFS History: Patient Active Problem List   Diagnosis Date Noted  . Grief 02/06/2021  . Non-intractable vomiting 02/06/2021  . Arthritis of right knee   . S/P knee replacement 01/06/2021  . Morning headache 06/02/2017  . Leiomyoma of body of uterus 12/24/2016  . Acute bronchitis 11/11/2016  . Prediabetes 11/11/2016  . Pap smear abnormality of cervix with ASCUS favoring benign 01/01/2016  . COPD (chronic obstructive pulmonary disease) ([place]) 11/18/2015  . GERD (gastroesophageal reflux  disease) 11/18/2015  . Abnormal imaging of thyroid 11/04/2015  . COPD exacerbation ([place]) 11/01/2015  . Elevated d-dimer 11/01/2015  . IDA (iron deficiency anemia) 09/04/2015  . 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 Insomnia 04/09/2014  . S/P cervical spinal fusion 03/06/2014  . Obesity 02/22/2014  . Radicular pain in right arm 01/11/2014  . 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 Other and unspecified hyperlipidemia 08/24/2013  . History of CVA (cerebrovascular accident) 05/18/2013  . 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 Anemia   . Anxiety   . Arthritis    right knee, back  . COPD (chronic obstructive pulmonary disease) ([place])   . GERD (gastroesophageal reflux disease)   . Headache(784.0)   . History of blood transfusion    Hx; of in 1991 after delivery  . History of TIAs   . Numbness    Right hand  . Pneumonia   . Stroke [facility]) 05/2013    Family History  Problem Relation Age of Onset  . Renal Disease Father        dialysis  . Hypertension Father   . Heart disease Mother   . Heart disease  Maternal Grandfather   . Asthma Sister   . Asthma Maternal Aunt     Past Surgical History:  Procedure Laterality Date  . ANTERIOR CERVICAL DECOMP/DISCECTOMY FUSION N/A 03/06/2014   Procedure: ANTERIOR CERVICAL DECOMPRESSION/DISCECTOMY FUSION 2 LEVELS four/five, five/six;  Surgeon: Eustace Moore, MD;  Location: [facility] NEURO ORS;  Service: Neurosurgery;  Laterality: N/A;  . CESAREAN SECTION     x 1 with 5th pregnancy  . CHOLECYSTECTOMY    . KNEE ARTHROSCOPY Right 2015  . TEE WITHOUT CARDIOVERSION N/A 05/21/2013   Procedure: TRANSESOPHAGEAL ECHOCARDIOGRAM (TEE);  Surgeon: Sueanne Margarita, MD;  Location: [place];  Service: Cardiovascular;  Laterality: N/A;  . TOTAL KNEE ARTHROPLASTY Right 01/06/2021  . TOTAL KNEE ARTHROPLASTY Right 01/06/2021   Procedure: RIGHT TOTAL KNEE ARTHROPLASTY;  Surgeon: Meredith Pel, MD;  Location: [place];   Service: Orthopedics;  Laterality: Right;  . TUBAL LIGATION     Social History   Occupational History  . Occupation: CNA    Employer: [employer]  Tobacco Use  . Smoking status: Former Smoker    Packs/day: 0.50    Years: 26.00    Pack years: 13.00    Types: Cigarettes    Quit date: 10/09/2015    Years since quitting: 5.3  . Smokeless tobacco: Former Network engineer  . Vaping Use: Never used  Substance and Sexual Activity  . Alcohol use: Not Currently    Alcohol/week: 0.0 standard drinks    Comment: occ  . Drug use: No  . Sexual activity: Not Currently

## 2021-02-16 ENCOUNTER — Other Ambulatory Visit: Payer: Self-pay

## 2021-02-16 ENCOUNTER — Ambulatory Visit (INDEPENDENT_AMBULATORY_CARE_PROVIDER_SITE_OTHER): Payer: PRIVATE HEALTH INSURANCE | Admitting: Physical Therapy

## 2021-02-16 DIAGNOSIS — M6281 Muscle weakness (generalized): Secondary | ICD-10-CM | POA: Diagnosis not present

## 2021-02-16 DIAGNOSIS — M25561 Pain in right knee: Secondary | ICD-10-CM

## 2021-02-16 DIAGNOSIS — M25661 Stiffness of right knee, not elsewhere classified: Secondary | ICD-10-CM

## 2021-02-16 DIAGNOSIS — R2689 Other abnormalities of gait and mobility: Secondary | ICD-10-CM

## 2021-02-16 DIAGNOSIS — R600 Localized edema: Secondary | ICD-10-CM

## 2021-02-16 NOTE — Therapy (Signed)
[facility] Physical Therapy [address] [place], [place], [address] Phone: [phone]   Fax:  [phone]  Physical Therapy Treatment  Patient Details  Name: [name] MRN: [id number] Date of Birth: 1971-05-29 Referring Provider (PT): Meredith Pel, MD   Encounter Date: 02/16/2021   PT End of Session - 02/16/21 1440    Visit Number 6    Number of Visits 17    Date for PT Re-Evaluation 03/20/21    Authorization Type MEDCOST EFFECTIVE 01/27/2017 AND ACTIVE  COVERED AT 100%, 30 PT VISITS PER CALENDER YEAR/REF #[id number]    Authorization - Visit Number 6    Authorization - Number of Visits 30    PT Start Time 9024    PT Stop Time 1427    PT Time Calculation (min) 42 min    Activity Tolerance Patient tolerated treatment well;Patient limited by fatigue;Patient limited by pain    Behavior During Therapy [facility] for tasks assessed/performed           Past Medical History:  Diagnosis Date  . Anemia   . Anxiety   . Arthritis    right knee, back  . COPD (chronic obstructive pulmonary disease) ([place])   . GERD (gastroesophageal reflux disease)   . Headache(784.0)   . History of blood transfusion    Hx; of in 1991 after delivery  . History of TIAs   . Numbness    Right hand  . Pneumonia   . Stroke [facility]) 05/2013    Past Surgical History:  Procedure Laterality Date  . ANTERIOR CERVICAL DECOMP/DISCECTOMY FUSION N/A 03/06/2014   Procedure: ANTERIOR CERVICAL DECOMPRESSION/DISCECTOMY FUSION 2 LEVELS four/five, five/six;  Surgeon: Eustace Moore, MD;  Location: [facility] NEURO ORS;  Service: Neurosurgery;  Laterality: N/A;  . CESAREAN SECTION     x 1 with 5th pregnancy  . CHOLECYSTECTOMY    . KNEE ARTHROSCOPY Right 2015  . TEE WITHOUT CARDIOVERSION N/A 05/21/2013   Procedure: TRANSESOPHAGEAL ECHOCARDIOGRAM (TEE);  Surgeon: Sueanne Margarita, MD;  Location: [place];  Service: Cardiovascular;  Laterality: N/A;  . TOTAL KNEE ARTHROPLASTY Right 01/06/2021  .  TOTAL KNEE ARTHROPLASTY Right 01/06/2021   Procedure: RIGHT TOTAL KNEE ARTHROPLASTY;  Surgeon: Meredith Pel, MD;  Location: [place];  Service: Orthopedics;  Laterality: Right;  . TUBAL LIGATION      There were no vitals filed for this visit.   Subjective Assessment - 02/16/21 1439    Subjective relays maybe less than 2/10 pain overall today.    Pertinent History arthritis, COPD, obesity, CVA ~7-84yrs ago, anterior cervial decompression / discentomy C4-6 03/06/2014    Limitations Lifting;Walking;House hold activities;Standing    Patient Stated Goals walk longer & without problems, return work, return to shopping    Pain Onset 1 to 4 weeks ago              [facility] PT Assessment - 02/16/21 0001      Assessment   Medical Diagnosis Z96.651 (ICD-10-CM) - Status post total right knee replacement    Referring Provider (PT) Meredith Pel, MD      AROM   Right Knee Extension 0    Right Knee Flexion 110      PROM   Right Knee Extension 0    Right Knee Flexion 115      Strength   Right Knee Flexion 5/5    Right Knee Extension 5/5    Left Knee Flexion 5/5    Left Knee Extension 5/5  [place] Adult PT Treatment/Exercise - 02/16/21 0001      Knee/Hip Exercises: Stretches   Sports administrator Right;3 reps;20 seconds    Quad Stretch Limitations AAROM with strap, leg off EOB    Knee: Self-Stretch Limitations 10 sec  X10 seated tailgate stretch      Knee/Hip Exercises: Aerobic   Nustep L5 x 10 min seat 5      Knee/Hip Exercises: Machines for Strengthening   Cybex Knee Extension bilat 10# 3X10    Cybex Knee Flexion bilat 25# 3X10    Cybex Leg Press 100 lbs bilat 2X10, then Rt leg only 43 lbs 2X10      Knee/Hip Exercises: Seated   Sit to Sand 2 sets;10 reps;without UE support      Manual Therapy   Manual therapy comments Rt knee PROM, manual H.S. stretching, flexion mobs                    PT Short Term Goals - 01/30/21 1011      PT SHORT TERM GOAL #1    Title Patient demo & verbalizes understanding of HEP for initial 4 weeks of PT.    Time 4    Period Weeks    Status On-going    Target Date 02/20/21      PT SHORT TERM GOAL #2   Title PROM right knee 0* to 90*    Time 4    Period Weeks    Status Achieved    Target Date 02/20/21             PT Long Term Goals - 02/04/21 1630      PT LONG TERM GOAL #1   Title FOTO >/= 62% functional    Time 8    Period Weeks    Status On-going    Target Date 03/20/21      PT LONG TERM GOAL #2   Title Patient reports pain in right knee </= 1/10 with functional activities    Time 8    Period Weeks    Status On-going    Target Date 03/20/21      PT LONG TERM GOAL #3   Title PROM right knee 0* - 100*    Time 8    Period Weeks    Status On-going    Target Date 03/20/21      PT LONG TERM GOAL #4   Title AROM seated LAQ right knee 0* and standing knee flexion 80*    Time 8    Period Weeks    Status On-going    Target Date 03/20/21      PT LONG TERM GOAL #5   Title Patient ambulates >500' without assistive device & negotiates ramps, curbs & stairs single rail independently.    Time 8    Period Weeks    Status On-going    Target Date 03/20/21                 Plan - 02/16/21 1443    Clinical Impression Statement updated measurements show great progress with Rt knee strength and ROM. She has 5/5 MMT but Still missing slight functional strength which we will continue to work on. Anticipate her only needing about 2 more weeks of PT.    Personal Factors and Comorbidities Comorbidity 3+;Past/Current Experience    Comorbidities arthritis, COPD, obesity, CVA ~7-37yrs ago, anterior cervial decompression / discentomy C4-6 03/06/2014    Examination-Activity Limitations Caring for Others;Lift;Locomotion Level;Sleep;Squat;Stairs;Stand    Examination-Participation  Restrictions Community Activity;Occupation;Shop    Stability/Clinical Decision Making Stable/Uncomplicated    Rehab Potential  Good    PT Frequency 2x / week    PT Duration 8 weeks    PT Treatment/Interventions ADLs/Self Care Home Management;Electrical Stimulation;Moist Heat;DME Instruction;Gait training;Stair training;Functional mobility training;Therapeutic activities;Therapeutic exercise;Balance training;Neuromuscular re-education;Patient/family education;Manual techniques;Scar mobilization;Passive range of motion;Vasopneumatic Device;Joint Manipulations    PT Next Visit Plan manual therapy & therapeutic exercise to increase range right knee, strengthening, end with vaso    PT Home Exercise Plan Access Code: [email or web address]           Patient will benefit from skilled therapeutic intervention in order to improve the following deficits and impairments:  Decreased activity tolerance,Decreased balance,Decreased mobility,Decreased range of motion,Decreased skin integrity,Decreased scar mobility,Decreased strength,Increased edema,Impaired flexibility,Pain  Visit Diagnosis: Acute pain of right knee  Localized edema  Stiffness of right knee, not elsewhere classified  Muscle weakness (generalized)  Other abnormalities of gait and mobility     Problem List Patient Active Problem List   Diagnosis Date Noted  . Grief 02/06/2021  . Non-intractable vomiting 02/06/2021  . Arthritis of right knee   . S/P knee replacement 01/06/2021  . Morning headache 06/02/2017  . Leiomyoma of body of uterus 12/24/2016  . Acute bronchitis 11/11/2016  . Prediabetes 11/11/2016  . Pap smear abnormality of cervix with ASCUS favoring benign 01/01/2016  . COPD (chronic obstructive pulmonary disease) ([place]) 11/18/2015  . GERD (gastroesophageal reflux disease) 11/18/2015  . Abnormal imaging of thyroid 11/04/2015  . COPD exacerbation ([place]) 11/01/2015  . Elevated d-dimer 11/01/2015  . IDA (iron deficiency anemia) 09/04/2015  . 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 Insomnia 04/09/2014  . S/P cervical spinal fusion 03/06/2014  . Obesity  02/22/2014  . Radicular pain in right arm 01/11/2014  . 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 Other and unspecified hyperlipidemia 08/24/2013  . History of CVA (cerebrovascular accident) 05/18/2013  . Tobacco abuse, 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    Silvestre Mesi 02/16/2021, 2:46 PM  [facility] Physical Therapy [address] [place], [place], [address] Phone: [phone]   Fax:  [phone]  Name: [name] MRN: [id number] Date of Birth: June 27, 1971

## 2021-02-17 ENCOUNTER — Telehealth: Payer: Self-pay | Admitting: Hematology and Oncology

## 2021-02-17 NOTE — Telephone Encounter (Signed)
Scheduled appt per 4/7 sch msg. Called pt, no answer. Left msg with appt date and time.

## 2021-02-18 ENCOUNTER — Encounter: Payer: PRIVATE HEALTH INSURANCE | Admitting: Physical Therapy

## 2021-02-23 ENCOUNTER — Encounter: Payer: Self-pay | Admitting: Physical Therapy

## 2021-02-23 ENCOUNTER — Other Ambulatory Visit: Payer: Self-pay

## 2021-02-23 ENCOUNTER — Ambulatory Visit (INDEPENDENT_AMBULATORY_CARE_PROVIDER_SITE_OTHER): Payer: PRIVATE HEALTH INSURANCE | Admitting: Physical Therapy

## 2021-02-23 DIAGNOSIS — R2689 Other abnormalities of gait and mobility: Secondary | ICD-10-CM

## 2021-02-23 DIAGNOSIS — M6281 Muscle weakness (generalized): Secondary | ICD-10-CM | POA: Diagnosis not present

## 2021-02-23 DIAGNOSIS — M25661 Stiffness of right knee, not elsewhere classified: Secondary | ICD-10-CM

## 2021-02-23 DIAGNOSIS — M25561 Pain in right knee: Secondary | ICD-10-CM

## 2021-02-23 DIAGNOSIS — R600 Localized edema: Secondary | ICD-10-CM

## 2021-02-23 NOTE — Therapy (Signed)
[facility] Physical Therapy [address] [place], [place], [address] Phone: [phone]   Fax:  [phone]  Physical Therapy Treatment  Patient Details  Name: [name] MRN: [id number] Date of Birth: 01-Nov-1971 Referring Provider (PT): Meredith Pel, MD   Encounter Date: 02/23/2021   PT End of Session - 02/23/21 1439    Visit Number 7    Number of Visits 17    Date for PT Re-Evaluation 03/20/21    Authorization Type [employer]ST EFFECTIVE 01/27/2017 AND ACTIVE  COVERED AT 100%, 30 PT VISITS PER CALENDER YEAR/REF #[id number]    Authorization - Visit Number 7    Authorization - Number of Visits 30    PT Start Time 2423    PT Stop Time 1432    PT Time Calculation (min) 47 min    Activity Tolerance Patient tolerated treatment well;Patient limited by fatigue;Patient limited by pain    Behavior During Therapy [facility] for tasks assessed/performed           Past Medical History:  Diagnosis Date  . Anemia   . Anxiety   . Arthritis    right knee, back  . COPD (chronic obstructive pulmonary disease) ([place])   . GERD (gastroesophageal reflux disease)   . Headache(784.0)   . History of blood transfusion    Hx; of in 1991 after delivery  . History of TIAs   . Numbness    Right hand  . Pneumonia   . Stroke [facility]) 05/2013    Past Surgical History:  Procedure Laterality Date  . ANTERIOR CERVICAL DECOMP/DISCECTOMY FUSION N/A 03/06/2014   Procedure: ANTERIOR CERVICAL DECOMPRESSION/DISCECTOMY FUSION 2 LEVELS four/five, five/six;  Surgeon: Eustace Moore, MD;  Location: [facility] NEURO ORS;  Service: Neurosurgery;  Laterality: N/A;  . CESAREAN SECTION     x 1 with 5th pregnancy  . CHOLECYSTECTOMY    . KNEE ARTHROSCOPY Right 2015  . TEE WITHOUT CARDIOVERSION N/A 05/21/2013   Procedure: TRANSESOPHAGEAL ECHOCARDIOGRAM (TEE);  Surgeon: Sueanne Margarita, MD;  Location: [place];  Service: Cardiovascular;  Laterality: N/A;  . TOTAL KNEE ARTHROPLASTY Right 01/06/2021  .  TOTAL KNEE ARTHROPLASTY Right 01/06/2021   Procedure: RIGHT TOTAL KNEE ARTHROPLASTY;  Surgeon: Meredith Pel, MD;  Location: [place];  Service: Orthopedics;  Laterality: Right;  . TUBAL LIGATION      There were no vitals filed for this visit.   Subjective Assessment - 02/23/21 1349    Subjective relays her knee is hurting more today and feels it may be due to the weather change    Pertinent History arthritis, COPD, obesity, CVA ~7-23yrs ago, anterior cervial decompression / discentomy C4-6 03/06/2014    Limitations Lifting;Walking;House hold activities;Standing    Patient Stated Goals walk longer & without problems, return work, return to shopping    Pain Onset 1 to 4 weeks ago              [facility] PT Assessment - 02/23/21 0001      Assessment   Medical Diagnosis Z96.651 (ICD-10-CM) - Status post total right knee replacement    Referring Provider (PT) Meredith Pel, MD    Onset Date/Surgical Date 01/06/21           [facility] Adult PT Treatment/Exercise - 02/23/21 0001      Knee/Hip Exercises: Stretches   Quad Stretch Right;3 reps;20 seconds    Quad Stretch Limitations AAROM with strap, leg off EOB    Knee: Self-Stretch Limitations 10 sec  X10 seated tailgate stretch  Gastroc Stretch Both;3 reps;30 seconds    Gastroc Stretch Limitations slantboard      Knee/Hip Exercises: Aerobic   Recumbent Bike L1 x8 min      Knee/Hip Exercises: Machines for Strengthening   Cybex Knee Extension bilat 10# 3X10    Cybex Knee Flexion bilat 25# 3X10    Cybex Leg Press 100 lbs bilat 3X10, then Rt leg only 43 lbs 2X10      Knee/Hip Exercises: Standing   Lateral Step Up Limitations lateral step up/down X 5 reps bilat with bilat UE support on 6 inch    Forward Step Up Right;10 reps;Hand Hold: 1;Step Height: 6"    Step Down Right;10 reps;Hand Hold: 1;Step Height: 6"      Knee/Hip Exercises: Seated   Sit to Sand 2 sets;10 reps;without UE support      Vasopneumatic   Number Minutes  Vasopneumatic  10 minutes    Vasopnuematic Location  Knee    Vasopneumatic Pressure High    Vasopneumatic Temperature  34      Manual Therapy   Manual therapy comments Rt knee PROM, manual H.S. stretching, flexion mobs                    PT Short Term Goals - 01/30/21 1011      PT SHORT TERM GOAL #1   Title Patient demo & verbalizes understanding of HEP for initial 4 weeks of PT.    Time 4    Period Weeks    Status On-going    Target Date 02/20/21      PT SHORT TERM GOAL #2   Title PROM right knee 0* to 90*    Time 4    Period Weeks    Status Achieved    Target Date 02/20/21             PT Long Term Goals - 02/04/21 1630      PT LONG TERM GOAL #1   Title FOTO >/= 62% functional    Time 8    Period Weeks    Status On-going    Target Date 03/20/21      PT LONG TERM GOAL #2   Title Patient reports pain in right knee </= 1/10 with functional activities    Time 8    Period Weeks    Status On-going    Target Date 03/20/21      PT LONG TERM GOAL #3   Title PROM right knee 0* - 100*    Time 8    Period Weeks    Status On-going    Target Date 03/20/21      PT LONG TERM GOAL #4   Title AROM seated LAQ right knee 0* and standing knee flexion 80*    Time 8    Period Weeks    Status On-going    Target Date 03/20/21      PT LONG TERM GOAL #5   Title Patient ambulates >500' without assistive device & negotiates ramps, curbs & stairs single rail independently.    Time 8    Period Weeks    Status On-going    Target Date 03/20/21                 Plan - 02/23/21 1439    Clinical Impression Statement She continues to progress well with PT. 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    Personal Factors and Comorbidities Comorbidity 3+;Past/Current Experience    Comorbidities arthritis, COPD, obesity, CVA ~7-44yrs ago, anterior  cervial decompression / discentomy C4-6 03/06/2014    Examination-Activity Limitations Caring for Others;Lift;Locomotion Level;Sleep;Squat;Stairs;Stand    Examination-Participation Restrictions Community Activity;Occupation;Shop    Stability/Clinical Decision Making Stable/Uncomplicated    Rehab Potential Good    PT Frequency 2x / week    PT Duration 8 weeks    PT Treatment/Interventions ADLs/Self Care Home Management;Electrical Stimulation;Moist Heat;DME Instruction;Gait training;Stair training;Functional mobility training;Therapeutic activities;Therapeutic exercise;Balance training;Neuromuscular re-education;Patient/family education;Manual techniques;Scar mobilization;Passive range of motion;Vasopneumatic Device;Joint Manipulations    PT Next Visit Plan manual therapy & therapeutic exercise to increase range right knee, strengthening, end with vaso    PT Home Exercise Plan Access Code: [email or web address]           Patient will benefit from skilled therapeutic intervention in order to improve the following deficits and impairments:  Decreased activity tolerance,Decreased balance,Decreased mobility,Decreased range of motion,Decreased skin integrity,Decreased scar mobility,Decreased strength,Increased edema,Impaired flexibility,Pain  Visit Diagnosis: Acute pain of right knee  Localized edema  Stiffness of right knee, not elsewhere classified  Muscle weakness (generalized)  Other abnormalities of gait and mobility     Problem List Patient Active Problem List   Diagnosis Date Noted  . Grief 02/06/2021  . Non-intractable vomiting 02/06/2021  . Arthritis of right knee   . S/P knee replacement 01/06/2021  . Morning headache 06/02/2017  . Leiomyoma of body of uterus 12/24/2016  . Acute bronchitis 11/11/2016  . Prediabetes 11/11/2016  . Pap smear abnormality of cervix with ASCUS favoring benign 01/01/2016  . COPD (chronic obstructive pulmonary disease) ([place]) 11/18/2015  . GERD  (gastroesophageal reflux disease) 11/18/2015  . Abnormal imaging of thyroid 11/04/2015  . COPD exacerbation ([place]) 11/01/2015  . Elevated d-dimer 11/01/2015  . IDA (iron deficiency anemia) 09/04/2015  . 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 Insomnia 04/09/2014  . S/P cervical spinal fusion 03/06/2014  . Obesity 02/22/2014  . Radicular pain in right arm 01/11/2014  . 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 Other and unspecified hyperlipidemia 08/24/2013  . History of CVA (cerebrovascular accident) 05/18/2013  . Tobacco abuse, 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    Silvestre Mesi 02/23/2021, 2:41 PM  [facility] Physical Therapy [address] [place], [place], [address] Phone: [phone]   Fax:  [phone]  Name: MICKY OVERTURF MRN: [id number] Date of Birth: 09-Aug-1971

## 2021-02-25 ENCOUNTER — Telehealth: Payer: Self-pay

## 2021-02-25 ENCOUNTER — Ambulatory Visit (INDEPENDENT_AMBULATORY_CARE_PROVIDER_SITE_OTHER): Payer: PRIVATE HEALTH INSURANCE | Admitting: Physical Therapy

## 2021-02-25 ENCOUNTER — Encounter: Payer: Self-pay | Admitting: Physical Therapy

## 2021-02-25 ENCOUNTER — Other Ambulatory Visit: Payer: Self-pay

## 2021-02-25 DIAGNOSIS — R600 Localized edema: Secondary | ICD-10-CM

## 2021-02-25 DIAGNOSIS — M6281 Muscle weakness (generalized): Secondary | ICD-10-CM

## 2021-02-25 DIAGNOSIS — M25661 Stiffness of right knee, not elsewhere classified: Secondary | ICD-10-CM

## 2021-02-25 DIAGNOSIS — M25561 Pain in right knee: Secondary | ICD-10-CM | POA: Diagnosis not present

## 2021-02-25 DIAGNOSIS — R2689 Other abnormalities of gait and mobility: Secondary | ICD-10-CM

## 2021-02-25 NOTE — Telephone Encounter (Signed)
Please advise 

## 2021-02-25 NOTE — Therapy (Signed)
[facility] Physical Therapy [address] [place], [place], [address] Phone: [phone]   Fax:  [phone]  Physical Therapy Treatment  Patient Details  Name: [name] MRN: [id number] Date of Birth: 04-May-1971 Referring Provider (PT): Meredith Pel, MD   Encounter Date: 02/25/2021   PT End of Session - 02/25/21 1429    Visit Number 8    Number of Visits 17    Date for PT Re-Evaluation 03/20/21    Authorization Type MEDCOST EFFECTIVE 01/27/2017 AND ACTIVE  COVERED AT 100%, 30 PT VISITS PER CALENDER YEAR/REF #[id number]    Authorization - Visit Number 8    Authorization - Number of Visits 30    PT Start Time 4081    PT Stop Time 4481    PT Time Calculation (min) 50 min    Activity Tolerance Patient tolerated treatment well;Patient limited by fatigue;Patient limited by pain    Behavior During Therapy [facility] for tasks assessed/performed           Past Medical History:  Diagnosis Date  . Anemia   . Anxiety   . Arthritis    right knee, back  . COPD (chronic obstructive pulmonary disease) ([place])   . GERD (gastroesophageal reflux disease)   . Headache(784.0)   . History of blood transfusion    Hx; of in 1991 after delivery  . History of TIAs   . Numbness    Right hand  . Pneumonia   . Stroke [facility]) 05/2013    Past Surgical History:  Procedure Laterality Date  . ANTERIOR CERVICAL DECOMP/DISCECTOMY FUSION N/A 03/06/2014   Procedure: ANTERIOR CERVICAL DECOMPRESSION/DISCECTOMY FUSION 2 LEVELS four/five, five/six;  Surgeon: Eustace Moore, MD;  Location: [facility] NEURO ORS;  Service: Neurosurgery;  Laterality: N/A;  . CESAREAN SECTION     x 1 with 5th pregnancy  . CHOLECYSTECTOMY    . KNEE ARTHROSCOPY Right 2015  . TEE WITHOUT CARDIOVERSION N/A 05/21/2013   Procedure: TRANSESOPHAGEAL ECHOCARDIOGRAM (TEE);  Surgeon: Sueanne Margarita, MD;  Location: [place];  Service: Cardiovascular;  Laterality: N/A;  . TOTAL KNEE ARTHROPLASTY Right 01/06/2021  .  TOTAL KNEE ARTHROPLASTY Right 01/06/2021   Procedure: RIGHT TOTAL KNEE ARTHROPLASTY;  Surgeon: Meredith Pel, MD;  Location: [place];  Service: Orthopedics;  Laterality: Right;  . TUBAL LIGATION      There were no vitals filed for this visit.   Subjective Assessment - 02/25/21 1405    Subjective relays her Rt knee is feeling good today, she will attempt to return to work next week    Pertinent History arthritis, COPD, obesity, CVA ~7-77yrs ago, anterior cervial decompression / discentomy C4-6 03/06/2014    Limitations Lifting;Walking;House hold activities;Standing    Patient Stated Goals walk longer & without problems, return work, return to shopping    Pain Onset 1 to 4 weeks ago              [facility] PT Assessment - 02/25/21 0001      Assessment   Medical Diagnosis Z96.651 (ICD-10-CM) - Status post total right knee replacement    Referring Provider (PT) Meredith Pel, MD    Onset Date/Surgical Date 01/06/21      PROM   Right Knee Extension 0    Right Knee Flexion 115      Strength   Right Knee Flexion 5/5    Right Knee Extension 5/5  [facility] Adult PT Treatment/Exercise - 02/25/21 0001      Knee/Hip Exercises: Stretches   Sports administrator Right;3 reps;20 seconds    Quad Stretch Limitations AAROM with strap, leg off EOB    Knee: Self-Stretch Limitations 10 sec  X10 seated tailgate stretch    Gastroc Stretch Both;3 reps;30 seconds    Gastroc Stretch Limitations slantboard      Knee/Hip Exercises: Aerobic   Recumbent Bike L1 x9 min      Knee/Hip Exercises: Machines for Strengthening   Cybex Knee Extension bilat 10# 3X10    Cybex Knee Flexion bilat 25# 3X10    Cybex Leg Press 106 lbs bilat 3X10, then Rt leg only 43 lbs 2X10      Knee/Hip Exercises: Standing   Lateral Step Up Limitations lateral step up/down X 10 reps bilat with bilat UE support on 8 inch    Forward Step Up Right;10 reps;Hand Hold: 1;Step Height: 8"      Knee/Hip  Exercises: Seated   Sit to Sand 2 sets;10 reps;without UE support      Vasopneumatic   Number Minutes Vasopneumatic  10 minutes    Vasopnuematic Location  Knee    Vasopneumatic Pressure High    Vasopneumatic Temperature  34      Manual Therapy   Manual therapy comments Rt knee PROM and flexion mobs                    PT Short Term Goals - 01/30/21 1011      PT SHORT TERM GOAL #1   Title Patient demo & verbalizes understanding of HEP for initial 4 weeks of PT.    Time 4    Period Weeks    Status On-going    Target Date 02/20/21      PT SHORT TERM GOAL #2   Title PROM right knee 0* to 90*    Time 4    Period Weeks    Status Achieved    Target Date 02/20/21             PT Long Term Goals - 02/04/21 1630      PT LONG TERM GOAL #1   Title FOTO >/= 62% functional    Time 8    Period Weeks    Status On-going    Target Date 03/20/21      PT LONG TERM GOAL #2   Title Patient reports pain in right knee </= 1/10 with functional activities    Time 8    Period Weeks    Status On-going    Target Date 03/20/21      PT LONG TERM GOAL #3   Title PROM right knee 0* - 100*    Time 8    Period Weeks    Status On-going    Target Date 03/20/21      PT LONG TERM GOAL #4   Title AROM seated LAQ right knee 0* and standing knee flexion 80*    Time 8    Period Weeks    Status On-going    Target Date 03/20/21      PT LONG TERM GOAL #5   Title Patient ambulates >500' without assistive device & negotiates ramps, curbs & stairs single rail independently.    Time 8    Period Weeks    Status On-going    Target Date 03/20/21                 Plan - 02/25/21  1430    Clinical Impression Statement She is doing well with strength and good with ROM (0-115 deg) PROM. She will attempt to return to work at the end of next week so we will see how she does with this.    Personal Factors and Comorbidities Comorbidity 3+;Past/Current Experience    Comorbidities  arthritis, COPD, obesity, CVA ~7-28yrs ago, anterior cervial decompression / discentomy C4-6 03/06/2014    Examination-Activity Limitations Caring for Others;Lift;Locomotion Level;Sleep;Squat;Stairs;Stand    Examination-Participation Restrictions Community Activity;Occupation;Shop    Stability/Clinical Decision Making Stable/Uncomplicated    Rehab Potential Good    PT Frequency 2x / week    PT Duration 8 weeks    PT Treatment/Interventions ADLs/Self Care Home Management;Electrical Stimulation;Moist Heat;DME Instruction;Gait training;Stair training;Functional mobility training;Therapeutic activities;Therapeutic exercise;Balance training;Neuromuscular re-education;Patient/family education;Manual techniques;Scar mobilization;Passive range of motion;Vasopneumatic Device;Joint Manipulations    PT Next Visit Plan manual therapy & therapeutic exercise to increase range right knee, strengthening, end with vaso    PT Home Exercise Plan Access Code: [email or web address]           Patient will benefit from skilled therapeutic intervention in order to improve the following deficits and impairments:  Decreased activity tolerance,Decreased balance,Decreased mobility,Decreased range of motion,Decreased skin integrity,Decreased scar mobility,Decreased strength,Increased edema,Impaired flexibility,Pain  Visit Diagnosis: Acute pain of right knee  Localized edema  Stiffness of right knee, not elsewhere classified  Muscle weakness (generalized)  Other abnormalities of gait and mobility     Problem List Patient Active Problem List   Diagnosis Date Noted  . Grief 02/06/2021  . Non-intractable vomiting 02/06/2021  . Arthritis of right knee   . S/P knee replacement 01/06/2021  . Morning headache 06/02/2017  . Leiomyoma of body of uterus 12/24/2016  . Acute bronchitis 11/11/2016  . Prediabetes 11/11/2016  . Pap smear abnormality of cervix with ASCUS favoring benign 01/01/2016  . COPD (chronic obstructive  pulmonary disease) ([place]) 11/18/2015  . GERD (gastroesophageal reflux disease) 11/18/2015  . Abnormal imaging of thyroid 11/04/2015  . COPD exacerbation ([place]) 11/01/2015  . Elevated d-dimer 11/01/2015  . IDA (iron deficiency anemia) 09/04/2015  . 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 Insomnia 04/09/2014  . S/P cervical spinal fusion 03/06/2014  . Obesity 02/22/2014  . Radicular pain in right arm 01/11/2014  . 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 Other and unspecified hyperlipidemia 08/24/2013  . History of CVA (cerebrovascular accident) 05/18/2013  . Tobacco abuse, 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    Silvestre Mesi 02/25/2021, 2:41 PM  [facility] Physical Therapy [address] [place], [place], [address] Phone: [phone]   Fax:  [phone]  Name: LURLIE WIGEN MRN: [id number] Date of Birth: 09/15/1971

## 2021-02-25 NOTE — Telephone Encounter (Signed)
Patient came into the office she is requesting a rx refill for Hydrocodone call back:[phone]

## 2021-02-26 ENCOUNTER — Other Ambulatory Visit: Payer: Self-pay | Admitting: Surgical

## 2021-02-26 ENCOUNTER — Telehealth: Payer: Self-pay | Admitting: Orthopedic Surgery

## 2021-02-26 MED ORDER — HYDROCODONE-ACETAMINOPHEN 5-325 MG PO TABS: ORAL_TABLET | ORAL | 0 refills | Status: DC

## 2021-02-26 NOTE — Telephone Encounter (Signed)
Sent in

## 2021-02-26 NOTE — Telephone Encounter (Signed)
Pt called stating she called on 02/25/21 in regards to a refill for her hydrocodone and she hadn't heard anything. Pt would like a CB as soon as this is called in please  [phone]

## 2021-02-26 NOTE — Telephone Encounter (Signed)
Patient aware Rx was called in

## 2021-03-02 ENCOUNTER — Ambulatory Visit (INDEPENDENT_AMBULATORY_CARE_PROVIDER_SITE_OTHER): Payer: Self-pay | Admitting: Rehabilitative and Restorative Service Providers"

## 2021-03-02 ENCOUNTER — Encounter: Payer: Self-pay | Admitting: Rehabilitative and Restorative Service Providers"

## 2021-03-02 ENCOUNTER — Other Ambulatory Visit: Payer: Self-pay

## 2021-03-02 ENCOUNTER — Telehealth: Payer: Self-pay

## 2021-03-02 DIAGNOSIS — M25661 Stiffness of right knee, not elsewhere classified: Secondary | ICD-10-CM

## 2021-03-02 DIAGNOSIS — R2681 Unsteadiness on feet: Secondary | ICD-10-CM

## 2021-03-02 DIAGNOSIS — M6281 Muscle weakness (generalized): Secondary | ICD-10-CM

## 2021-03-02 DIAGNOSIS — R600 Localized edema: Secondary | ICD-10-CM

## 2021-03-02 DIAGNOSIS — M25561 Pain in right knee: Secondary | ICD-10-CM

## 2021-03-02 DIAGNOSIS — R2689 Other abnormalities of gait and mobility: Secondary | ICD-10-CM

## 2021-03-02 DIAGNOSIS — Z96651 Presence of right artificial knee joint: Secondary | ICD-10-CM

## 2021-03-02 NOTE — Telephone Encounter (Signed)
Pt would like a call about her knee from Physical Therapy

## 2021-03-02 NOTE — Telephone Encounter (Signed)
Needs u/s r/o dvt thx

## 2021-03-02 NOTE — Therapy (Signed)
[facility] Physical Therapy [address] [place], [place], [address] Phone: [phone]   Fax:  [phone]  Physical Therapy Treatment  Patient Details  Name: [name] MRN: [id number] Date of Birth: 13-Jun-1971 Referring Provider (PT): Meredith Pel, MD   Encounter Date: 03/02/2021   PT End of Session - 03/02/21 1348    Visit Number 9    Number of Visits 17    Date for PT Re-Evaluation 03/20/21    Authorization Type MEDCOST EFFECTIVE 01/27/2017 AND ACTIVE  COVERED AT 100%, 30 PT VISITS PER CALENDER YEAR/REF #[id number]    Authorization - Visit Number 9    Authorization - Number of Visits 30    PT Start Time 0145    PT Stop Time 0231    PT Time Calculation (min) 46 min    Activity Tolerance Patient tolerated treatment well;Patient limited by pain    Behavior During Therapy [facility] for tasks assessed/performed           Past Medical History:  Diagnosis Date  . Anemia   . Anxiety   . Arthritis    right knee, back  . COPD (chronic obstructive pulmonary disease) ([place])   . GERD (gastroesophageal reflux disease)   . Headache(784.0)   . History of blood transfusion    Hx; of in 1991 after delivery  . History of TIAs   . Numbness    Right hand  . Pneumonia   . Stroke [facility]) 05/2013    Past Surgical History:  Procedure Laterality Date  . ANTERIOR CERVICAL DECOMP/DISCECTOMY FUSION N/A 03/06/2014   Procedure: ANTERIOR CERVICAL DECOMPRESSION/DISCECTOMY FUSION 2 LEVELS four/five, five/six;  Surgeon: Eustace Moore, MD;  Location[facility] NEURO ORS;  Service: Neurosurgery;  Laterality: N/A;  . CESAREAN SECTION     x 1 with 5th pregnancy  . CHOLECYSTECTOMY    . KNEE ARTHROSCOPY Right 2015  . TEE WITHOUT CARDIOVERSION N/A 05/21/2013   Procedure: TRANSESOPHAGEAL ECHOCARDIOGRAM (TEE);  Surgeon: Sueanne Margarita, MD;  Location: [place];  Service: Cardiovascular;  Laterality: N/A;  . TOTAL KNEE ARTHROPLASTY Right 01/06/2021  . TOTAL KNEE ARTHROPLASTY  Right 01/06/2021   Procedure: RIGHT TOTAL KNEE ARTHROPLASTY;  Surgeon: Meredith Pel, MD;  Location: [place];  Service: Orthopedics;  Laterality: Right;  . TUBAL LIGATION      There were no vitals filed for this visit.   Subjective Assessment - 03/02/21 1347    Subjective I am hurting today. Didn't get to sleep until 5 AM last night. I finally took some pain meds. It is hurting on the outside of my knee and it's swollen. During the day Sunday it was OK.    Pertinent History arthritis, COPD, obesity, CVA ~7-58yrs ago, anterior cervial decompression / discentomy C4-6 03/06/2014    Limitations Lifting;Walking;House hold activities;Standing    Currently in Pain? Yes    Pain Score 4     Pain Location Knee    Pain Orientation Right;Lateral    Pain Descriptors / Indicators Sharp    Pain Type Surgical pain    Pain Onset More than a month ago    Pain Frequency Intermittent    Aggravating Factors  stairs    Multiple Pain Sites No                             [facility] Adult PT Treatment/Exercise - 03/02/21 0001      Knee/Hip Exercises: Aerobic   Recumbent Bike no resistance  x 6 minutes due to pt increase in pain upon arrival      Knee/Hip Exercises: Supine   Other Supine Knee/Hip Exercises SLR x 20 R, VMO SLR x 20; bridge x 20      Manual Therapy   Manual therapy comments R knee scar massage horizontally and vertically throughout with multiple scar nodules noted; pt able to tolerate without increased pain                  PT Education - 03/02/21 1542    Education Details answered pt questions regarding knee healing process; tried to figure out cause of R lateral knee pain with conclusion being increased STS while visiting friend and also transfer on/off of a low toilet. PT demonstrated increased torque possible with improper STS transfer from a too low surface. Pt stated understanding.    Person(s) Educated Patient    Methods Explanation;Demonstration     Comprehension Verbalized understanding            PT Short Term Goals - 03/02/21 1547      PT SHORT TERM GOAL #1   Title Patient demo & verbalizes understanding of HEP for initial 4 weeks of PT.    Status On-going             PT Long Term Goals - 02/04/21 1630      PT LONG TERM GOAL #1   Title FOTO >/= 62% functional    Time 8    Period Weeks    Status On-going    Target Date 03/20/21      PT LONG TERM GOAL #2   Title Patient reports pain in right knee </= 1/10 with functional activities    Time 8    Period Weeks    Status On-going    Target Date 03/20/21      PT LONG TERM GOAL #3   Title PROM right knee 0* - 100*    Time 8    Period Weeks    Status On-going    Target Date 03/20/21      PT LONG TERM GOAL #4   Title AROM seated LAQ right knee 0* and standing knee flexion 80*    Time 8    Period Weeks    Status On-going    Target Date 03/20/21      PT LONG TERM GOAL #5   Title Patient ambulates >500' without assistive device & negotiates ramps, curbs & stairs single rail independently.    Time 8    Period Weeks    Status On-going    Target Date 03/20/21                 Plan - 03/02/21 1358    Clinical Impression Statement Pt presents to PT with increased R knee pain that began last night insidiously; she cannot recall any aggravating motions or activities that would increase the pain other than sitting down and standing up from a too low toilet along with increased reps of STS transfers while visiting a friend. There is increased lateral joint swelling at R lateral tibial plateau without pitting or warmth; this is where she reports the pain to be present. There is increased R lateral knee pain with end range of motion knee flexion/ext. knee flexion AROM 110; ext 0 degrees. Multiple scar nodules noted below R knee incision; where the nodules are is where the patient reports feeling "discomfort/skin tearing" with end ROM knee flexion. Pt with concern with  RTW given new onset of R knee pain upon arrival to PT from Sunday night.    Rehab Potential Good    PT Frequency 2x / week    PT Duration 8 weeks    PT Treatment/Interventions ADLs/Self Care Home Management;Electrical Stimulation;Moist Heat;DME Instruction;Gait training;Stair training;Functional mobility training;Therapeutic activities;Therapeutic exercise;Balance training;Neuromuscular re-education;Patient/family education;Manual techniques;Scar mobilization;Passive range of motion;Vasopneumatic Device;Joint Manipulations    PT Next Visit Plan manual therapy & therapeutic exercise to increase range right knee, strengthening, end with vaso; pt was to contact MD regarding new onset of pain; what did MD say    Consulted and Agree with Plan of Care Patient           Patient will benefit from skilled therapeutic intervention in order to improve the following deficits and impairments:  Decreased activity tolerance,Decreased balance,Decreased mobility,Decreased range of motion,Decreased skin integrity,Decreased scar mobility,Decreased strength,Increased edema,Impaired flexibility,Pain  Visit Diagnosis: Acute pain of right knee  Localized edema  Stiffness of right knee, not elsewhere classified  Muscle weakness (generalized)  Other abnormalities of gait and mobility  Unsteadiness on feet     Problem List Patient Active Problem List   Diagnosis Date Noted  . Grief 02/06/2021  . Non-intractable vomiting 02/06/2021  . Arthritis of right knee   . S/P knee replacement 01/06/2021  . Morning headache 06/02/2017  . Leiomyoma of body of uterus 12/24/2016  . Acute bronchitis 11/11/2016  . Prediabetes 11/11/2016  . Pap smear abnormality of cervix with ASCUS favoring benign 01/01/2016  . COPD (chronic obstructive pulmonary disease) ([place]) 11/18/2015  . GERD (gastroesophageal reflux disease) 11/18/2015  . Abnormal imaging of thyroid 11/04/2015  . COPD exacerbation ([place]) 11/01/2015  .  Elevated d-dimer 11/01/2015  . IDA (iron deficiency anemia) 09/04/2015  . 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 Insomnia 04/09/2014  . S/P cervical spinal fusion 03/06/2014  . Obesity 02/22/2014  . Radicular pain in right arm 01/11/2014  . 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 Other and unspecified hyperlipidemia 08/24/2013  . History of CVA (cerebrovascular accident) 05/18/2013  . Tobacco abuse, 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    America Brown, PT, DPT 03/02/2021, 3:54 PM  [facility] Physical Therapy [address] [place], [place], [address] Phone: [phone]   Fax:  [phone]  Name: JAQUANDA WICKERSHAM MRN: [id number] Date of Birth: 1971/03/26

## 2021-03-02 NOTE — Telephone Encounter (Signed)
Dr. Dean pt.  °

## 2021-03-02 NOTE — Telephone Encounter (Signed)
See below. Patient said she noticed some increased pain and swelling that started last night. She said swelling lateral. No calf pain but does complain of pain posterior in bend of knee. No family history of blood clot. Taking aspirin daily. Tried ice/ibuprofen with some improvement. She did say she was out and about this weekend more than she had and wonders if this could have contributed?

## 2021-03-03 ENCOUNTER — Ambulatory Visit (HOSPITAL_COMMUNITY)
Admission: RE | Admit: 2021-03-03 | Discharge: 2021-03-03 | Disposition: A | Discharge status: Home / Self Care | Payer: PRIVATE HEALTH INSURANCE | Source: Ambulatory Visit | Attending: Physician Assistant | Admitting: Physician Assistant

## 2021-03-03 ENCOUNTER — Ambulatory Visit (HOSPITAL_COMMUNITY)
Admission: RE | Admit: 2021-03-03 | Discharge: 2021-03-03 | Disposition: A | Discharge status: Home / Self Care | Payer: PRIVATE HEALTH INSURANCE | Source: Ambulatory Visit | Attending: Orthopedic Surgery | Admitting: Orthopedic Surgery

## 2021-03-03 DIAGNOSIS — J441 Chronic obstructive pulmonary disease with (acute) exacerbation: Secondary | ICD-10-CM

## 2021-03-03 DIAGNOSIS — Z96651 Presence of right artificial knee joint: Secondary | ICD-10-CM | POA: Insufficient documentation

## 2021-03-03 NOTE — Addendum Note (Signed)
Addended byLaurann Montana on: 03/03/2021 10:55 AM   Modules accepted: Orders

## 2021-03-03 NOTE — Telephone Encounter (Signed)
See below. I scheduled for today 03/03/21 at 4pm.  Tried calling patient to s

## 2021-03-03 NOTE — Telephone Encounter (Signed)
I called patient. Was able to reach her. She is aware of appt.

## 2021-03-04 ENCOUNTER — Other Ambulatory Visit: Payer: Self-pay

## 2021-03-04 ENCOUNTER — Ambulatory Visit (INDEPENDENT_AMBULATORY_CARE_PROVIDER_SITE_OTHER): Payer: PRIVATE HEALTH INSURANCE | Admitting: Physical Therapy

## 2021-03-04 ENCOUNTER — Encounter: Payer: Self-pay | Admitting: Physical Therapy

## 2021-03-04 DIAGNOSIS — M6281 Muscle weakness (generalized): Secondary | ICD-10-CM | POA: Diagnosis not present

## 2021-03-04 DIAGNOSIS — R2689 Other abnormalities of gait and mobility: Secondary | ICD-10-CM

## 2021-03-04 DIAGNOSIS — M25561 Pain in right knee: Secondary | ICD-10-CM

## 2021-03-04 DIAGNOSIS — M25661 Stiffness of right knee, not elsewhere classified: Secondary | ICD-10-CM | POA: Diagnosis not present

## 2021-03-04 DIAGNOSIS — R600 Localized edema: Secondary | ICD-10-CM | POA: Diagnosis not present

## 2021-03-04 NOTE — Therapy (Addendum)
[facility] Physical Therapy [address] [place], [place], [address] Phone: [phone]   Fax:  [phone]  Physical Therapy Discharge addendum/Treatment/Progress note PHYSICAL THERAPY DISCHARGE SUMMARY  Visits from Start of Care: 10  Current functional level related to goals / functional outcomes: See below   Remaining deficits: See below   Education / Equipment: HEP Plan: Patient goals were partially met. Patient is being discharged due to not returning since last visit.     Progress Note reporting period date 01/28/21 to 03/04/21  See below for objective and subjective measurements relating to patients progress with PT.   Patient Details  Name: [name] MRN: [id number] Date of Birth: [birth year] Referring Provider (PT): Meredith Pel, MD   Encounter Date: 03/04/2021   PT End of Session - 03/04/21 1418     Visit Number 10    Number of Visits 17    Date for PT Re-Evaluation 03/20/21    Authorization Type MEDCOST EFFECTIVE 01/27/2017 AND ACTIVE  COVERED AT 100%, 30 PT VISITS PER CALENDER YEAR/REF #[id number]    Authorization - Visit Number 10    Authorization - Number of Visits 30    PT Start Time 0383    PT Stop Time 1416    PT Time Calculation (min) 38 min    Activity Tolerance Patient tolerated treatment well;Patient limited by pain    Behavior During Therapy WFL for tasks assessed/performed             Past Medical History:  Diagnosis Date   Anemia    Anxiety    Arthritis    right knee, back   COPD (chronic obstructive pulmonary disease) ([facility])    GERD (gastroesophageal reflux disease)    Headache(784.0)    History of blood transfusion    Hx; of in 1991 after delivery   History of TIAs    Numbness    Right hand   Pneumonia    Stroke ([place]) 05/2013    Past Surgical History:  Procedure Laterality Date   ANTERIOR CERVICAL DECOMP/DISCECTOMY FUSION N/A 03/06/2014   Procedure: ANTERIOR CERVICAL  DECOMPRESSION/DISCECTOMY FUSION 2 LEVELS four/five, five/six;  Surgeon: Eustace Moore, MD;  Location: [facility] NEURO ORS;  Service: Neurosurgery;  Laterality: N/A;   CESAREAN SECTION     x 1 with 5th pregnancy   CHOLECYSTECTOMY     KNEE ARTHROSCOPY Right 2015   TEE WITHOUT CARDIOVERSION N/A 05/21/2013   Procedure: TRANSESOPHAGEAL ECHOCARDIOGRAM (TEE);  Surgeon: Sueanne Margarita, MD;  Location: [place];  Service: Cardiovascular;  Laterality: N/A;   TOTAL KNEE ARTHROPLASTY Right 01/06/2021   TOTAL KNEE ARTHROPLASTY Right 01/06/2021   Procedure: RIGHT TOTAL KNEE ARTHROPLASTY;  Surgeon: Meredith Pel, MD;  Location: [place];  Service: Orthopedics;  Laterality: Right;   TUBAL LIGATION      There were no vitals filed for this visit.   Subjective Assessment - 03/04/21 1350     Subjective Relays continued pain in her posterior knee. She had U.S. to rule out DVT which was negative. She rates 4/10 knee pain today.    Pertinent History arthritis, COPD, obesity, CVA ~7-4yrs ago, anterior cervial decompression / discentomy C4-6 03/06/2014    Limitations Lifting;Walking;House hold activities;Standing    Patient Stated Goals walk longer & without problems, return work, return to shopping    Pain Onset More than a month ago                [facility] PT Assessment - 03/04/21 0001  Assessment   Medical Diagnosis Z96.651 (ICD-10-CM) - Status post total right knee replacement    Referring Provider (PT) Meredith Pel, MD    Onset Date/Surgical Date 01/06/21      AROM   Right Knee Flexion 110      PROM   Right Knee Extension 0    Right Knee Flexion 7      Strength   Right Knee Flexion 5/5    Right Knee Extension 5/5              [facility] Adult PT Treatment/Exercise - 03/04/21 0001       Knee/Hip Exercises: Stretches   Active Hamstring Stretch Right;3 reps;30 seconds    Active Hamstring Stretch Limitations seated    Knee: Self-Stretch Limitations 10 sec  X10 seated tailgate  stretch    Gastroc Stretch Both;3 reps;30 seconds    Gastroc Stretch Limitations slantboard      Knee/Hip Exercises: Aerobic   Nustep L4 x 10 min      Knee/Hip Exercises: Standing   Knee Flexion Right;2 sets;10 reps    Knee Flexion Limitations 3#    Hip Abduction Right;2 sets;10 reps    Abduction Limitations 3#      Knee/Hip Exercises: Seated   Long Arc Quad Right;3 sets;10 reps    Long Arc Quad Weight 3 lbs.      Knee/Hip Exercises: Supine   Straight Leg Raises Right;20 reps      Manual Therapy   Manual therapy comments Rt knee PROM and flexion mobs                      PT Short Term Goals - 03/02/21 1547       PT SHORT TERM GOAL #1   Title Patient demo & verbalizes understanding of HEP for initial 4 weeks of PT.    Status On-going               PT Long Term Goals - 02/04/21 1630       PT LONG TERM GOAL #1   Title FOTO >/= 62% functional    Time 8    Period Weeks    Status On-going    Target Date 03/20/21      PT LONG TERM GOAL #2   Title Patient reports pain in right knee </= 1/10 with functional activities    Time 8    Period Weeks    Status On-going    Target Date 03/20/21      PT LONG TERM GOAL #3   Title PROM right knee 0* - 100*    Time 8    Period Weeks    Status On-going    Target Date 03/20/21      PT LONG TERM GOAL #4   Title AROM seated LAQ right knee 0* and standing knee flexion 80*    Time 8    Period Weeks    Status On-going    Target Date 03/20/21      PT LONG TERM GOAL #5   Title Patient ambulates >500' without assistive device & negotiates ramps, curbs & stairs single rail independently.    Time 8    Period Weeks    Status On-going    Target Date 03/20/21                   Plan - 03/04/21 1420     Clinical Impression Statement Progress note reflects she has made good overall  progress since starting PT. Her Rt knee ROM and strength are doing well S/P TKA however she has had more overall pain the  last 2 weeks so PT is recommending to continue for another 3-4 sessions while she tries to transition back to work duties.    Personal Factors and Comorbidities Comorbidity 3+;Past/Current Experience    Comorbidities arthritis, COPD, obesity, CVA ~7-31yrs ago, anterior cervial decompression / discentomy C4-6 03/06/2014    Examination-Activity Limitations Caring for Others;Lift;Locomotion Level;Sleep;Squat;Stairs;Stand    Examination-Participation Restrictions Community Activity;Occupation;Shop    Rehab Potential Good    PT Frequency 2x / week    PT Duration 8 weeks    PT Treatment/Interventions ADLs/Self Care Home Management;Electrical Stimulation;Moist Heat;DME Instruction;Gait training;Stair training;Functional mobility training;Therapeutic activities;Therapeutic exercise;Balance training;Neuromuscular re-education;Patient/family education;Manual techniques;Scar mobilization;Passive range of motion;Vasopneumatic Device;Joint Manipulations    PT Next Visit Plan manual therapy & therapeutic exercise to increase range right knee. How was return to work.    PT Home Exercise Plan Access Code: [email or web address]    Consulted and Agree with Plan of Care Patient             Patient will benefit from skilled therapeutic intervention in order to improve the following deficits and impairments:  Decreased activity tolerance,Decreased balance,Decreased mobility,Decreased range of motion,Decreased skin integrity,Decreased scar mobility,Decreased strength,Increased edema,Impaired flexibility,Pain  Visit Diagnosis: Acute pain of right knee  Localized edema  Stiffness of right knee, not elsewhere classified  Muscle weakness (generalized)  Other abnormalities of gait and mobility     Problem List Patient Active Problem List   Diagnosis Date Noted   Grief 02/06/2021   Non-intractable vomiting 02/06/2021   Arthritis of right knee    S/P knee replacement 01/06/2021   Morning headache 06/02/2017    Leiomyoma of body of uterus 12/24/2016   Acute bronchitis 11/11/2016   Prediabetes 11/11/2016   Pap smear abnormality of cervix with ASCUS favoring benign 01/01/2016   COPD (chronic obstructive pulmonary disease) ([place]) 11/18/2015   GERD (gastroesophageal reflux disease) 11/18/2015   Abnormal imaging of thyroid 11/04/2015   COPD exacerbation ([place]) 11/01/2015   Elevated d-dimer 11/01/2015   IDA (iron deficiency anemia) 09/04/2015   Patellofemoral disorder 04/04/2015   Insomnia 04/09/2014   S/P cervical spinal fusion 03/06/2014   Obesity 02/22/2014   Radicular pain in right arm 01/11/2014   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   Other and unspecified hyperlipidemia 08/24/2013   History of CVA (cerebrovascular accident) 05/18/2013   Tobacco abuse, 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    Silvestre Mesi 03/04/2021, 2:23 PM  [facility] Physical Therapy [address] [place], [place], [address] Phone: [phone]   Fax:  [phone]  Name: [name] MRN: [id number] Date of Birth: [birth year]

## 2021-03-05 ENCOUNTER — Other Ambulatory Visit: Payer: Self-pay | Admitting: Internal Medicine

## 2021-03-05 DIAGNOSIS — Z1231 Encounter for screening mammogram for malignant neoplasm of breast: Secondary | ICD-10-CM

## 2021-03-06 ENCOUNTER — Encounter: Payer: Self-pay | Admitting: Physician Assistant

## 2021-03-06 ENCOUNTER — Telehealth: Payer: PRIVATE HEALTH INSURANCE | Admitting: Physician Assistant

## 2021-03-06 ENCOUNTER — Encounter: Payer: Self-pay | Admitting: Family

## 2021-03-06 DIAGNOSIS — J441 Chronic obstructive pulmonary disease with (acute) exacerbation: Secondary | ICD-10-CM

## 2021-03-06 MED ORDER — FLUTICASONE-SALMETEROL 232-14 MCG/ACT IN AEPB
1.0000 | INHALATION_SPRAY | Freq: Two times a day (BID) | RESPIRATORY_TRACT | 0 refills | Status: DC
Start: 2021-03-06 — End: 2021-09-22

## 2021-03-06 MED ORDER — ALBUTEROL SULFATE HFA 108 (90 BASE) MCG/ACT IN AERS
2.0000 | INHALATION_SPRAY | Freq: Four times a day (QID) | RESPIRATORY_TRACT | 0 refills | Status: DC | PRN
Start: 2021-03-06 — End: 2021-05-19

## 2021-03-06 MED ORDER — PREDNISONE 10 MG PO TABS: ORAL_TABLET | ORAL | 0 refills | Status: DC

## 2021-03-06 MED ORDER — PROMETHAZINE-DM 6.25-15 MG/5ML PO SYRP: 5.0000 mL | ORAL_SOLUTION | Freq: Four times a day (QID) | ORAL | 0 refills | Status: DC | PRN

## 2021-03-06 NOTE — Patient Instructions (Addendum)
Chronic Obstructive Pulmonary Disease Exacerbation Chronic obstructive pulmonary disease (COPD) is a long-term (chronic) lung problem. In COPD, the flow of air from the lungs is limited. COPD exacerbations are times that breathing gets worse and you need more than your normal treatment. Without treatment, they can be life threatening. If they happen often, your lungs can become more damaged. If your COPD gets worse, your doctor may treat you with:  Medicines.  Oxygen.  Different ways to clear your airway, such as using a mask. Follow these instructions at home: Medicines  Take over-the-counter and prescription medicines only as told by your doctor.  If you take an antibiotic or steroid medicine, do not stop taking the medicine even if you start to feel better.  Keep up with shots (vaccinations) as told by your doctor. Be sure to get a yearly (annual) flu shot. Lifestyle  Do not smoke. If you need help quitting, ask your doctor.  Eat healthy foods.  Exercise regularly.  Get plenty of sleep.  Avoid tobacco smoke and other things that can bother your lungs.  Wash your hands often with soap and water. This will help keep you from getting an infection. If you cannot use soap and water, use hand sanitizer.  During flu season, avoid areas that are crowded with people. General instructions  Drink enough fluid to keep your pee (urine) clear or pale yellow. Do not do this if your doctor has told you not to.  Use a cool mist machine (vaporizer).  If you use oxygen or a machine that turns medicine into a mist (nebulizer), continue to use it as told.  Follow all instructions for rehabilitation. These are steps you can take to make your body work better.  Keep all follow-up visits as told by your doctor. This is important. Contact a doctor if:  Your COPD symptoms get worse than normal. Get help right away if:  You are short of breath and it gets worse.  You have trouble  talking.  You have chest pain.  You cough up blood.  You have a fever.  You keep throwing up (vomiting).  You feel weak or you pass out (faint).  You feel confused.  You are not able to sleep because of your symptoms.  You are not able to do daily activities. Summary  COPD exacerbations are times that breathing gets worse and you need more treatment than normal.  COPD exacerbations can be very serious and may cause your lungs to become more damaged.  Do not smoke. If you need help quitting, ask your doctor.  Stay up-to-date on your shots. Get a flu shot every year. This information is not intended to replace advice given to you by your health care provider. Make sure you discuss any questions you have with your health care provider. Document Revised: 10/07/2017 Document Reviewed: 11/29/2016 Elsevier Patient Education  2021 [employer]. https://www.ncbi.nlm.nih.gov/books/NBK545316/#:~:text=The%20definition%20of%20atelectasis%20is,to%39feel%20short%20of%20breath.">  Atelectasis, Adult  Atelectasis is a collapse of air sacs in the lungs (alveoli). The condition causes all or part of a lung to collapse. Atelectasis is a common problem after surgery. Its severity depends on the size of the area of lung tissue involved and on the underlying cause. When severe, it can lead to shortness of breath and heart problems. Atelectasis can develop suddenly or over a long period of time. Atelectasis that develops over a long period (chronic atelectasis) often leads to infection, scarring, and other problems. What are the causes? This condition may be caused by:  Shallow breathing.  A blockage in an airway. Blockages can result from: ? A buildup of mucus. ? A tumor. ? An inhaled object (foreign body). ? A blood clot in the lungs.  Outside pressure on the lung. Pressure can be due to: ? A tumor. ? A buildup of fluid outside the lungs (pleural effusion). ? Air leaking between the lung  and rib cage (pneumothorax). ? Enlarged lymph nodes. What increases the risk? The following factors may make you more likely to develop this condition:  Having had surgery on the chest or abdomen.  An injury or health problem that makes taking deep breaths difficult or painful, such as broken ribs.  Certain infections or diseases, such as pneumonia or cystic fibrosis.  Taking medicines, such as sedatives, that decrease the rate of your breathing or how deeply you breathe.  Being in bed or lying flat for long periods of time. What are the signs or symptoms? In many cases, there are no symptoms. When symptoms do occur, they may include:  Shortness of breath.  A cough.  Rapid breathing. How is this diagnosed? This condition may be diagnosed based on:  Your symptoms and medical history.  A physical exam.  A chest X-ray. Sometimes, other imaging tests are needed to diagnose the condition. How is this treated? Treatment for this condition depends on the cause. Treatment may involve:  Getting out of bed and walking around.  Coughing. Coughing helps loosen mucus in the airway.  Deep breathing exercises. An incentive spirometer is a device that is used to help you take deep breaths.  Positive pressure breathing. This is a form of breathing assistance in which air is forced into the lungs when you breathe in (inhale).  Chest physiotherapy. This is a treatment to help loosen and clear mucus from the airways. It is done by clapping the chest. Follow these instructions at home:  Take over-the-counter and prescription medicines only as told by your health care provider.  Stay as active as possible as told by your health care provider.  Make sure to lie on your unaffected side when you are lying down. For example, if you have atelectasis in your left lung, lie on your right side. This will help mucus drain from your airway.  Cough and take deep breaths often. Use your incentive  spirometer as directed by your health care provider.  Do not use any products that contain nicotine or tobacco, such as cigarettes, e-cigarettes, and chewing tobacco. If you need help quitting, ask your health care provider.  Keep all follow-up visits as told by your health care provider. This is important. Contact a health care provider if:  You have symptoms for more than 2-3 days. Get help right away if:  You have severe chest pain.  You cough up blood.  You have a fever.  Your symptoms suddenly get worse. Summary  Atelectasis is a collapse of air sacs in the lungs (alveoli). The condition causes all or part of a lung to collapse.  Cough and take deep breaths often. Use your incentive spirometer as directed by your health care provider.  Stay as active as possible as told by your health care provider.  Get help right away if your symptoms suddenly get worse. This information is not intended to replace advice given to you by your health care provider. Make sure you discuss any questions you have with your health care provider. Document Revised: 10/03/2019 Document Reviewed: 10/03/2019 Elsevier Patient Education  2021 [employer].

## 2021-03-06 NOTE — Progress Notes (Signed)
[name], [name] are scheduled for a virtual visit with your provider today.    Just as we do with appointments in the office, we must obtain your consent to participate.  Your consent will be active for this visit and any virtual visit you may have with one of our providers in the next 365 days.    If you have a MyChart account, I can also send a copy of this consent to you electronically.  All virtual visits are billed to your insurance company just like a traditional visit in the office.  As this is a virtual visit, video technology does not allow for your provider to perform a traditional examination.  This may limit your provider's ability to fully assess your condition.  If your provider identifies any concerns that need to be evaluated in person or the need to arrange testing such as labs, EKG, etc, we will make arrangements to do so.    Although advances in technology are sophisticated, we cannot ensure that it will always work on either your end or our end.  If the connection with a video visit is poor, we may have to switch to a telephone visit.  With either a video or telephone visit, we are not always able to ensure that we have a secure connection.   I need to obtain your verbal consent now.   Are you willing to proceed with your visit today?   RYA RAUSCH has provided verbal consent on 03/06/2021 for a virtual visit (video or telephone).   Mar Daring, PA-C 03/06/2021  11:47 AM     MyChart Video Visit    Virtual Visit via Video Note   This visit type was conducted due to national recommendations for restrictions regarding the COVID-19 Pandemic (e.g. social distancing) in an effort to limit this patient's exposure and mitigate transmission in our community. This patient is at least at moderate risk for complications without adequate follow up. This format is felt to be most appropriate for this patient at this time. Physical exam was limited by quality of the video and  audio technology used for the visit.   Patient location: Home Provider location: Home office in [place]  I discussed the limitations of evaluation and management by telemedicine and the availability of in person appointments. The patient expressed understanding and agreed to proceed.  Patient: [name]   DOB: Jan 13, 1971   50 y.o. Female  MRN: [id number] Visit Date: 03/06/2021  Today's healthcare provider: Mar Daring, PA-C   No chief complaint on file.  Subjective    Cough This is a chronic problem. The current episode started more than 1 month ago. The problem has been unchanged. The cough is non-productive. Pertinent negatives include no chest pain, chills, fever, hemoptysis, nasal congestion, postnasal drip, rhinorrhea, sore throat, shortness of breath or wheezing. The symptoms are aggravated by lying down. She has tried a beta-agonist inhaler and oral steroids (zpak) for the symptoms.    Patient was seen by her PCP on 02/05/21 for same symptoms. Was prescribed 6 day prednisone taper, zpak, and albuterol inhaler. Symptoms did not change or improve. Had a CXR on 03/04/21, negative for pneumonia or nodules. Did show atelectasis in left lung. She does mention having severe pneumonia infection on the left side in 1991.    Patient Active Problem List   Diagnosis Date Noted  . Grief 02/06/2021  . Non-intractable vomiting 02/06/2021  . Arthritis of right knee   . S/P  knee replacement 01/06/2021  . Morning headache 06/02/2017  . Leiomyoma of body of uterus 12/24/2016  . Acute bronchitis 11/11/2016  . Prediabetes 11/11/2016  . Pap smear abnormality of cervix with ASCUS favoring benign 01/01/2016  . COPD (chronic obstructive pulmonary disease) ([place]) 11/18/2015  . GERD (gastroesophageal reflux disease) 11/18/2015  . Abnormal imaging of thyroid 11/04/2015  . COPD exacerbation ([place]) 11/01/2015  . Elevated d-dimer 11/01/2015  . IDA (iron deficiency anemia) 09/04/2015  .  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 Insomnia 04/09/2014  . S/P cervical spinal fusion 03/06/2014  . Obesity 02/22/2014  . Radicular pain in right arm 01/11/2014  . 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 Other and unspecified hyperlipidemia 08/24/2013  . History of CVA (cerebrovascular accident) 05/18/2013  . 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 Anemia   . Anxiety   . Arthritis    right knee, back  . COPD (chronic obstructive pulmonary disease) ([place])   . GERD (gastroesophageal reflux disease)   . Headache(784.0)   . History of blood transfusion    Hx; of in 1991 after delivery  . History of TIAs   . Numbness    Right hand  . Pneumonia   . Stroke [facility]) 05/2013      Medications: Outpatient Medications Prior to Visit  Medication Sig  . albuterol (PROVENTIL) (2.5 MG/3ML) 0.083% nebulizer solution TAKE 3 MLS (2.5 MG TOTAL) BY NEBULIZATION EVERY 6 (SIX) HOURS AS NEEDED FOR WHEEZING OR SHORTNESS OF BREATH.  Marland Kitchen albuterol (VENTOLIN HFA) 108 (90 Base) MCG/ACT inhaler Inhale 2 puffs into the lungs every 6 (six) hours as needed for wheezing or shortness of breath.  Marland Kitchen aspirin 81 MG chewable tablet Chew 1 tablet (81 mg total) by mouth 2 (two) times daily.  Marland Kitchen azithromycin (ZITHROMAX) 250 MG tablet Take 2 tabs PO day 1, then take 1 tab PO once daily  . ferrous sulfate 325 (65 FE) MG tablet Take 1 tablet (325 mg total) by mouth every other day.  . ferrous sulfate 325 (65 FE) MG tablet TAKE 1 TABLET (325 MG TOTAL) BY MOUTH DAILY WITH BREAKFAST.  . ferumoxytol (FERAHEME) 510 MG/17ML SOLN injection Inject 17 mLs (510 mg total) into the vein once for 1 dose. (Patient not taking: Reported on 02/05/2021)  . ferumoxytol (FERAHEME) 510 MG/17ML SOLN injection INJECT 17 MLS (510 MG TOTAL) INTO THE VEIN ONCE FOR 1 DOSE.  Marland Kitchen FLUoxetine (PROZAC) 20 MG capsule Take 1 capsule (20 mg total) by mouth daily.  Marland Kitchen gabapentin (NEURONTIN) 300 MG capsule Take 1  capsule (300 mg total) by mouth 3 (three) times daily.  Marland Kitchen HYDROcodone-acetaminophen (NORCO/VICODIN) 5-325 MG tablet 1 PO daily prn pain  . hydrOXYzine (ATARAX/VISTARIL) 25 MG tablet Take 1 -2 tabs PO QHS PRN for insomnia  . ipratropium (ATROVENT) 0.06 % nasal spray PLACE 2 SPRAYS INTO BOTH NOSTRILS 4 (FOUR) TIMES DAILY.  . methocarbamol (ROBAXIN) 500 MG tablet Take 1 tablet (500 mg total) by mouth every 8 (eight) hours as needed for muscle spasms.  Marland Kitchen omeprazole (PRILOSEC) 20 MG capsule Take 20 mg by mouth daily.  . ondansetron (ZOFRAN) 4 MG tablet Take 1 tablet (4 mg total) by mouth every 8 (eight) hours as needed for nausea.  Marland Kitchen zolpidem (AMBIEN CR) 6.25 MG CR tablet 1 po q d x 21 days prn   No facility-administered medications prior to visit.    Review of Systems  Constitutional: Negative for chills and fever.  HENT: Positive for congestion. Negative for postnasal drip, rhinorrhea, sinus pressure, sore throat and trouble swallowing.   Respiratory: Positive for cough and chest tightness. Negative for hemoptysis, shortness of breath and wheezing.   Cardiovascular: Negative for chest pain.    Last metabolic panel Lab Results  Component Value Date   GLUCOSE 106 (H) 02/09/2021   NA 142 02/09/2021   K 3.5 02/09/2021   CL 101 02/09/2021   CO2 28 02/09/2021   BUN 13 02/09/2021   CREATININE 0.91 02/09/2021   GFRNONAA >60 02/09/2021   GFRAA 94 09/01/2020   CALCIUM 8.4 (L) 02/09/2021   PROT 7.5 02/09/2021   ALBUMIN 3.8 02/09/2021   LABGLOB 2.8 09/01/2020   AGRATIO 1.6 09/01/2020   BILITOT 0.5 02/09/2021   ALKPHOS 104 02/09/2021   AST 14 (L) 02/09/2021   ALT 14 02/09/2021   ANIONGAP 13 02/09/2021      Objective    There were no vitals taken for this visit. BP Readings from Last 3 Encounters:  02/09/21 (!) 180/95  01/07/21 (!) 143/81  12/31/20 (!) 139/94   Wt Readings from Last 3 Encounters:  02/09/21 193 lb 8 oz (87.8 kg)  01/06/21 191 lb (86.6 kg)  12/31/20 191 lb 14.4 oz  (87 kg)      Physical Exam Vitals reviewed.  Constitutional:      General: She is not in acute distress.    Appearance: Normal appearance. She is well-developed. She is not ill-appearing.  HENT:     Head: Normocephalic and atraumatic.  Pulmonary:     Effort: Pulmonary effort is normal. No respiratory distress (mild, dry cough heard through interview x 1; able to speak in full sentences without issue).  Musculoskeletal:     Cervical back: Normal range of motion and neck supple.  Neurological:     Mental Status: She is alert.  Psychiatric:        Behavior: Behavior normal.        Thought Content: Thought content normal.        Judgment: Judgment normal.    CLINICAL DATA:  Productive cough for 2-3 weeks.  EXAM: CHEST - 2 VIEW  COMPARISON:  Chest radiograph dated 10/31/2015.  FINDINGS: The heart size and mediastinal contours are within normal limits. There is minimal left infrahilar atelectasis/airspace disease. The right lung is clear. There is no pleural effusion or pneumothorax. Fixation hardware overlies the cervical spine. Degenerative changes are seen in the spine.  IMPRESSION: Minimal left infrahilar atelectasis/airspace disease.   Electronically Signed   By: Zerita Boers M.D.   On: 03/04/2021 15:09   Assessment & Plan     1. COPD exacerbation ([place]) - Reviewed CXR - Will add AirDuo inhaler (preferred on insurance), albuterol inhaler refilled. 12 day prednisone taper added. Promethazine DM for nighttime cough - Push fluids - Strict return precautions to follow up with PCP if symptoms are not improving or if they worsen - albuterol (VENTOLIN HFA) 108 (90 Base) MCG/ACT inhaler; Inhale 2 puffs into the lungs every 6 (six) hours as needed for wheezing or shortness of breath.  Dispense: 8 g; Refill: 0 - Fluticasone-Salmeterol 232-14 MCG/ACT AEPB; Inhale 1 puff into the lungs in the morning and at bedtime.  Dispense: 1 each; Refill: 0 - predniSONE (DELTASONE)  10 MG tablet; Take 6 tablets PO on day 1 and day 2, take 5 tablets PO on day 3 and day 4, take 4 tablets PO on day 5 and day 6, take 3 tablets PO on day  7 and day 8, take 2 tablets PO on day 9 and day 10, take one tablet PO on day 11 and day 12.  Dispense: 42 tablet; Refill: 0 - promethazine-dextromethorphan (PROMETHAZINE-DM) 6.25-15 MG/5ML syrup; Take 5 mLs by mouth 4 (four) times daily as needed for cough.  Dispense: 118 mL; Refill: 0   No follow-ups on file.     I discussed the assessment and treatment plan with the patient. The patient was provided an opportunity to ask questions and all were answered. The patient agreed with the plan and demonstrated an understanding of the instructions.   The patient was advised to call back or seek an in-person evaluation if the symptoms worsen or if the condition fails to improve as anticipated.  I provided 18 minutes of face-to-face time during this encounter via MyChart Video enabled encounter.   [place] [phone] (phone) [phone] (fax)  [place]

## 2021-03-06 NOTE — Telephone Encounter (Signed)
Patient had visit with [place] on today 03/06/2021. Prescribed promethazine-dextromethorphan (PROMETHAZINE-DM) for cough. Follow-up with primary provider as scheduled.

## 2021-03-10 ENCOUNTER — Telehealth: Payer: Self-pay | Admitting: *Deleted

## 2021-03-10 NOTE — Telephone Encounter (Signed)
-----   Message from Kennieth Rad, [place] sent at 03/05/2021  9:36 AM EDT ----- Please call patient and let her know that her chest x-ray shows minimal airspace disease in left lung, right lung is clear.  She should continue with current treatment plan.

## 2021-03-10 NOTE — Telephone Encounter (Signed)
Medical Assistant left message on patient's home and cell voicemail. Voicemail states to give a call back to [place] with [facility] at [phone]. Patient has viewed results via mychart.

## 2021-03-13 ENCOUNTER — Encounter: Payer: PRIVATE HEALTH INSURANCE | Admitting: Rehabilitative and Restorative Service Providers"

## 2021-03-13 ENCOUNTER — Telehealth: Payer: Self-pay | Admitting: Rehabilitative and Restorative Service Providers"

## 2021-03-13 NOTE — Telephone Encounter (Signed)
Spoke with Stanton Kidney.  She forgot about today's appointment but she will be here Tuesday at 8 with [facility].

## 2021-03-17 ENCOUNTER — Encounter: Payer: PRIVATE HEALTH INSURANCE | Admitting: Physical Therapy

## 2021-03-18 ENCOUNTER — Telehealth: Payer: PRIVATE HEALTH INSURANCE | Admitting: Family

## 2021-03-18 ENCOUNTER — Encounter: Payer: Self-pay | Admitting: Physician Assistant

## 2021-03-18 ENCOUNTER — Telehealth: Payer: Self-pay | Admitting: Internal Medicine

## 2021-03-18 ENCOUNTER — Telehealth: Payer: Self-pay

## 2021-03-18 ENCOUNTER — Encounter: Payer: Self-pay | Admitting: Family

## 2021-03-18 DIAGNOSIS — U071 COVID-19: Secondary | ICD-10-CM | POA: Diagnosis not present

## 2021-03-18 MED ORDER — DEXAMETHASONE 6 MG PO TABS: 6.0000 mg | ORAL_TABLET | Freq: Two times a day (BID) | ORAL | 0 refills | Status: DC

## 2021-03-18 MED ORDER — PROMETHAZINE-DM 6.25-15 MG/5ML PO SYRP: 5.0000 mL | ORAL_SOLUTION | Freq: Four times a day (QID) | ORAL | 0 refills | Status: DC | PRN

## 2021-03-18 NOTE — Progress Notes (Signed)
Virtual Visit  Note Due to COVID-19 pandemic this visit was conducted virtually. This visit type was conducted due to national recommendations for restrictions regarding the COVID-19 Pandemic (e.g. social distancing, sheltering in place) in an effort to limit this patient's exposure and mitigate transmission in our community. All issues noted in this document were discussed and addressed.  A physical exam was not performed with this format.  I connected with [name] on 03/18/21 at 1:30 pm  by video and verified that I am speaking with the correct person using two identifiers. [name] is currently located at home and no one is currently with her during visit. The provider, Evelina Dun, FNP is located in their office at time of visit.  I discussed the limitations, risks, security and privacy concerns of performing an evaluation and management service by video and the availability of in person appointments. I also discussed with the patient that there may be a patient responsible charge related to this service. The patient expressed understanding and agreed to proceed.   History and Present Illness:  Pt presents to the office today with positive COVID. States her symptoms started on 03/15/21 and had a positive COVID test on 03/17/21.   Cough This is a new problem. The current episode started in the past 7 days. The problem has been waxing and waning. The problem occurs every few minutes. The cough is non-productive. Associated symptoms include headaches, myalgias, nasal congestion, postnasal drip, a sore throat, shortness of breath and wheezing. Pertinent negatives include no chills, ear congestion, ear pain or fever. She has tried rest, prescription cough suppressant and OTC cough suppressant for the symptoms. The treatment provided mild relief.     Review of Systems  Constitutional: Negative for chills and fever.  HENT: Positive for postnasal drip and sore throat. Negative for  ear pain.   Respiratory: Positive for cough, shortness of breath and wheezing.   Musculoskeletal: Positive for myalgias.  Neurological: Positive for headaches.  All other systems reviewed and are negative.    Observations/Objective: No SOB or distress noted, laying in bed, Nasal voice.   Assessment and Plan: 1. COVID-19 virus detected COVID positive, rest, force fluids, tylenol as needed, Quarantine for at least 5 days and fever free, report any worsening symptoms such as increased shortness of breath, swelling, or continued high fevers.  - promethazine-dextromethorphan (PROMETHAZINE-DM) 6.25-15 MG/5ML syrup; Take 5 mLs by mouth 4 (four) times daily as needed for cough.  Dispense: 118 mL; Refill: 0 - dexamethasone (DECADRON) 6 MG tablet; Take 1 tablet (6 mg total) by mouth 2 (two) times daily.  Dispense: 14 tablet; Refill: 0 - MyChart COVID-19 home monitoring program; Future     I discussed the assessment and treatment plan with the patient. The patient was provided an opportunity to ask questions and all were answered. The patient agreed with the plan and demonstrated an understanding of the instructions.   The patient was advised to call back or seek an in-person evaluation if the symptoms worsen or if the condition fails to improve as anticipated.  The above assessment and management plan was discussed with the patient. The patient verbalized understanding of and has agreed to the management plan. Patient is aware to call the clinic if symptoms persist or worsen. Patient is aware when to return to the clinic for a follow-up visit. Patient educated on when it is appropriate to go to the emergency department.   Time call ended:  1:38 pm  I provided 8 minutes of   face-to-face time during this encounter.    Evelina Dun, FNP

## 2021-03-18 NOTE — Telephone Encounter (Signed)
Pt. Had answered questionnaire questions stating appetite and diarrhea are worse. Pt. Had a video visit this morning and medications were prescribed. Pt. Has not picked them up. Instructed to get medications and start taking as directed. Instructed to reach out to her PCP as needed.

## 2021-03-19 ENCOUNTER — Encounter: Payer: PRIVATE HEALTH INSURANCE | Admitting: Physical Therapy

## 2021-03-19 NOTE — Telephone Encounter (Signed)
Patient states she thought she needed an ATB for Covid. Advised that the medication the provider sent yesterday should help alleviate sx's.   Advised to rest and drink plenty of fluids.  If she has trouble with breathing she was instructed to go to the ED.

## 2021-03-23 ENCOUNTER — Telehealth: Payer: Self-pay | Admitting: Physical Therapy

## 2021-03-23 ENCOUNTER — Encounter: Payer: PRIVATE HEALTH INSURANCE | Admitting: Physical Therapy

## 2021-03-23 NOTE — Telephone Encounter (Signed)
Pt no show for PT appointment today. They were contacted by phone with no answer and unable to leave voicemail due to mailbox being full. They have one MD follow up appointment and then more PT visit scheduled at this time.  Elsie Ra, PT, DPT 03/23/21 8:24 AM

## 2021-03-25 ENCOUNTER — Encounter: Payer: PRIVATE HEALTH INSURANCE | Admitting: Physical Therapy

## 2021-03-25 ENCOUNTER — Ambulatory Visit: Payer: PRIVATE HEALTH INSURANCE | Admitting: Orthopedic Surgery

## 2021-04-07 ENCOUNTER — Ambulatory Visit
Admission: EM | Admit: 2021-04-07 | Discharge: 2021-04-07 | Disposition: A | Discharge status: Home / Self Care | Payer: PRIVATE HEALTH INSURANCE | Attending: Family Medicine | Admitting: Family Medicine

## 2021-04-07 ENCOUNTER — Encounter: Payer: Self-pay | Admitting: Emergency Medicine

## 2021-04-07 ENCOUNTER — Other Ambulatory Visit: Payer: Self-pay

## 2021-04-07 ENCOUNTER — Ambulatory Visit
Admission: RE | Admit: 2021-04-07 | Discharge: 2021-04-07 | Disposition: A | Discharge status: Home / Self Care | Payer: PRIVATE HEALTH INSURANCE | Source: Ambulatory Visit

## 2021-04-07 DIAGNOSIS — J209 Acute bronchitis, unspecified: Secondary | ICD-10-CM

## 2021-04-07 DIAGNOSIS — U071 COVID-19: Secondary | ICD-10-CM

## 2021-04-07 MED ORDER — DOXYCYCLINE HYCLATE 100 MG PO CAPS: 100.0000 mg | ORAL_CAPSULE | Freq: Two times a day (BID) | ORAL | 0 refills | Status: DC

## 2021-04-07 MED ORDER — PROMETHAZINE-DM 6.25-15 MG/5ML PO SYRP: 5.0000 mL | ORAL_SOLUTION | Freq: Four times a day (QID) | ORAL | 0 refills | Status: DC | PRN

## 2021-04-07 NOTE — ED Triage Notes (Signed)
5/11 covid positive.  Prescribed steroids  For 3 days the cough has worsened.

## 2021-04-07 NOTE — ED Provider Notes (Signed)
[facility]    CSN: [id number] Arrival date & time: 04/07/21  0845      History   Chief Complaint Chief Complaint  Patient presents with  . Cough  . Appointment    9:00    HPI DIANEY Rowland is a 50 y.o. female.   HPI  Patient presents today for evaluation of persistent cough.  She was diagnosed with COVID-19 on May 11.  Since that time she has had a persistently worsening cough.  She has been prescribed steroids however has not achieved any relief of cough.  Patient has a history of COPD, denies any wheezing.  Cough is occasionally productive.  She has not albuterol and her long-acting inhaler which she uses as prescribed.  Past Medical History:  Diagnosis Date  . Anemia   . Anxiety   . Arthritis    right knee, back  . COPD (chronic obstructive pulmonary disease) ([place])   . GERD (gastroesophageal reflux disease)   . Headache(784.0)   . History of blood transfusion    Hx; of in 1991 after delivery  . History of TIAs   . Numbness    Right hand  . Pneumonia   . Stroke [facility]) 05/2013    Patient Active Problem List   Diagnosis Date Noted  . Grief 02/06/2021  . Non-intractable vomiting 02/06/2021  . Arthritis of right knee   . S/P knee replacement 01/06/2021  . Morning headache 06/02/2017  . Leiomyoma of body of uterus 12/24/2016  . Prediabetes 11/11/2016  . Pap smear abnormality of cervix with ASCUS favoring benign 01/01/2016  . COPD (chronic obstructive pulmonary disease) ([place]) 11/18/2015  . GERD (gastroesophageal reflux disease) 11/18/2015  . Abnormal imaging of thyroid 11/04/2015  . COPD exacerbation ([place]) 11/01/2015  . Elevated d-dimer 11/01/2015  . IDA (iron deficiency anemia) 09/04/2015  . 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 Insomnia 04/09/2014  . S/P cervical spinal fusion 03/06/2014  . Obesity 02/22/2014  . Radicular pain in right arm 01/11/2014  . 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 Other and unspecified hyperlipidemia  08/24/2013  . History of CVA (cerebrovascular accident) 05/18/2013  . 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 ANTERIOR CERVICAL DECOMP/DISCECTOMY FUSION N/A 03/06/2014   Procedure: ANTERIOR CERVICAL DECOMPRESSION/DISCECTOMY FUSION 2 LEVELS four/five, five/six;  Surgeon: Eustace Moore, MD;  Location: [facility] NEURO ORS;  Service: Neurosurgery;  Laterality: N/A;  . CESAREAN SECTION     x 1 with 5th pregnancy  . CHOLECYSTECTOMY    . KNEE ARTHROSCOPY Right 2015  . TEE WITHOUT CARDIOVERSION N/A 05/21/2013   Procedure: TRANSESOPHAGEAL ECHOCARDIOGRAM (TEE);  Surgeon: Sueanne Margarita, MD;  Location: [place];  Service: Cardiovascular;  Laterality: N/A;  . TOTAL KNEE ARTHROPLASTY Right 01/06/2021  . TOTAL KNEE ARTHROPLASTY Right 01/06/2021   Procedure: RIGHT TOTAL KNEE ARTHROPLASTY;  Surgeon: Meredith Pel, MD;  Location: [place];  Service: Orthopedics;  Laterality: Right;  . TUBAL LIGATION      OB History   No obstetric history on file.      Home Medications    Prior to Admission medications   Medication Sig Start Date End Date Taking? Authorizing Provider  albuterol (PROVENTIL) (2.5 MG/3ML) 0.083% nebulizer solution TAKE 3 MLS (2.5 MG TOTAL) BY NEBULIZATION EVERY 6 (SIX) HOURS AS NEEDED FOR WHEEZING OR SHORTNESS OF BREATH. Patient not taking: Reported on 04/07/2021 11/11/20 11/11/21  Nicolette Bang, DO  albuterol (VENTOLIN HFA) 108 [phone]  Base) MCG/ACT inhaler Inhale 2 puffs into the lungs every 6 (six) hours as needed for wheezing or shortness of breath. 03/06/21   Mar Daring, PA-C  aspirin 81 MG chewable tablet Chew 1 tablet (81 mg total) by mouth 2 (two) times daily. 01/07/21   Magnant, Charles L, PA-C  dexamethasone (DECADRON) 6 MG tablet Take 1 tablet (6 mg total) by mouth 2 (two) times daily. 03/18/21   Evelina Dun A, FNP  ferrous sulfate 325 (65 FE) MG tablet Take 1 tablet (325 mg total) by mouth every other day.  12/15/20   Nicolette Bang, DO  ferrous sulfate 325 (65 FE) MG tablet TAKE 1 TABLET (325 MG TOTAL) BY MOUTH DAILY WITH BREAKFAST. 11/19/20 11/19/21  Camillia Herter, NP  ferumoxytol [facility]) 510 MG/17ML SOLN injection Inject 17 mLs (510 mg total) into the vein once for 1 dose. Patient not taking: Reported on 02/05/2021 11/28/20 11/28/20  Camillia Herter, NP  ferumoxytol [facility]) 510 MG/17ML SOLN injection INJECT 17 MLS (510 MG TOTAL) INTO THE VEIN ONCE FOR 1 DOSE. 11/20/20 11/20/21  Camillia Herter, NP  FLUoxetine (PROZAC) 20 MG capsule Take 1 capsule (20 mg total) by mouth daily. Patient not taking: Reported on 04/07/2021 12/15/20   Nicolette Bang, DO  Fluticasone-Salmeterol [phone] MCG/ACT AEPB Inhale 1 puff into the lungs in the morning and at bedtime. 03/06/21   Mar Daring, PA-C  gabapentin (NEURONTIN) 300 MG capsule Take 1 capsule (300 mg total) by mouth 3 (three) times daily. Patient not taking: Reported on 04/07/2021 01/07/21   Donella Stade, PA-C  HYDROcodone-acetaminophen (NORCO/VICODIN) 5-325 MG tablet 1 PO daily prn pain 02/26/21   Magnant, Charles L, PA-C  hydrOXYzine (ATARAX/VISTARIL) 25 MG tablet Take 1 -2 tabs PO QHS PRN for insomnia Patient not taking: Reported on 04/07/2021 02/05/21   Mayers, Cari S, PA-C  ipratropium (ATROVENT) 0.06 % nasal spray PLACE 2 SPRAYS INTO BOTH NOSTRILS 4 (FOUR) TIMES DAILY. 11/11/20 11/11/21  Nicolette Bang, DO  methocarbamol (ROBAXIN) 500 MG tablet Take 1 tablet (500 mg total) by mouth every 8 (eight) hours as needed for muscle spasms. Patient not taking: Reported on 04/07/2021 01/30/21   Magnant, Gerrianne Scale, PA-C  omeprazole (PRILOSEC) 20 MG capsule Take 20 mg by mouth daily.    [provider]  ondansetron (ZOFRAN) 4 MG tablet Take 1 tablet (4 mg total) by mouth every 8 (eight) hours as needed for nausea. Patient not taking: Reported on 04/07/2021 01/07/21   Magnant, Gerrianne Scale, PA-C  promethazine-dextromethorphan  (PROMETHAZINE-DM) 6.25-15 MG/5ML syrup Take 5 mLs by mouth 4 (four) times daily as needed for cough. 03/18/21   Sharion Balloon, FNP  zolpidem (AMBIEN CR) 6.25 MG CR tablet 1 po q d x 21 days prn Patient not taking: Reported on 04/07/2021 02/11/21   Meredith Pel, MD    Family History Family History  Problem Relation Age of Onset  . Renal Disease Father        dialysis  . Hypertension Father   . Heart disease Mother   . Heart disease Maternal Grandfather   . Asthma Sister   . Asthma Maternal Aunt     Social History Social History   Tobacco Use  . Smoking status: Former Smoker    Packs/day: 0.50    Years: 26.00    Pack years: 13.00    Types: Cigarettes    Quit date: 10/09/2015    Years since quitting: 5.4  . Smokeless tobacco: Former  User  Vaping Use  . Vaping Use: Never used  Substance Use Topics  . Alcohol use: Not Currently    Alcohol/week: 0.0 standard drinks    Comment: occ  . Drug use: No     Allergies   Shellfish allergy, Amoxicillin, and Latex   Review of Systems Review of Systems Pertinent negatives listed in HPI   Physical Exam Triage Vital Signs ED Triage Vitals  Enc Vitals Group     BP 04/07/21 0913 136/90     Pulse Rate 04/07/21 0913 90     Resp 04/07/21 0913 18     Temp 04/07/21 0913 97.6 F (36.4 C)     Temp Source 04/07/21 0913 Oral     SpO2 04/07/21 0913 96 %     Weight --      Height --      Head Circumference --      Peak Flow --      Pain Score 04/07/21 0934 0     Pain Loc --      Pain Edu? --      Excl. in [place]? --    No data found.  Updated Vital Signs BP 136/90 (BP Location: Left Arm)   Pulse 90   Temp 97.6 F (36.4 C) (Oral)   Resp 18   LMP 04/07/2021   SpO2 96%   Visual Acuity Right Eye Distance:   Left Eye Distance:   Bilateral Distance:    Right Eye Near:   Left Eye Near:    Bilateral Near:     Physical Exam  General appearance: alert, Ill-appearing, no distress Head: Normocephalic, without obvious  abnormality, atraumatic ENT: External ears normal, nares patent, oropharynx patent clear  Respiratory: Respirations even , unlabored, coarse lung sound, normal air entry Heart: Rate and rhythm normal. No gallop or murmurs noted on exam  Extremities: No gross deformities Skin: Skin color, texture, turgor normal. No rashes seen  Psych: Appropriate mood and affect. Neurologic: GCS 15, normal coordination normal gait UC Treatments / Results  Labs (all labs ordered are listed, but only abnormal results are displayed) Labs Reviewed - No data to display  EKG   Radiology No results found.  Procedures Procedures (including critical care time)  Medications Ordered in UC Medications - No data to display  Initial Impression / Assessment and Plan / UC Course  I have reviewed the triage vital signs and the nursing notes.  Pertinent labs & imaging results that were available during my care of the patient were reviewed by me and considered in my medical decision making (see chart for details).    Acute bronchitis patient recently completed a round of steroids therefore will treat with doxycycline 100 mg twice daily x10 days.  Promethazine DM prescribed for management of cough.  Patient should continue inhaler as needed and as prescribed.  Return precautions given.  Vital signs stable. Final Clinical Impressions(s) / UC Diagnoses   Final diagnoses:  Acute bronchitis, unspecified organism   Discharge Instructions   None    ED Prescriptions    Medication Sig Dispense Auth. Provider   promethazine-dextromethorphan (PROMETHAZINE-DM) 6.25-15 MG/5ML syrup Take 5 mLs by mouth 4 (four) times daily as needed for cough. 140 mL Scot Jun, FNP   doxycycline (VIBRAMYCIN) 100 MG capsule Take 1 capsule (100 mg total) by mouth 2 (two) times daily. 20 capsule Scot Jun, FNP     PDMP not reviewed this encounter.   Scot Jun, [place] 04/07/21 [phone]

## 2021-04-23 ENCOUNTER — Ambulatory Visit: Payer: PRIVATE HEALTH INSURANCE

## 2021-05-18 NOTE — Progress Notes (Signed)
Patient ID: Dawn MARTINEZGARCIA, female    DOB: 1971/06/29  MRN: [id number]  CC: Bronchitis Flare and Anxiety   Subjective: [name] is a 50 y.o. female who presents for bronchitis flare and anxiety.   Her concerns today include:  BRONCHITIS:  History of bronchitis flare. Current flare for about 1.5 weeks. Using inhaler and nebulizer without relief. Would like referral back to Pulmonology. Reports weekly Covid testing at her place of employment.  Cough: Yes [x]  Productive of white phlegm Shortness of breath: Yes [x]  History of COPD Chest pain: not pain but does feel tight Headache: Yes[x]   No[]  Fever: Yes[]  No[x]  Sore throat: not sore but does feel scratchy Body aches: Yes []  No[x]   2. ANXIETY FOLLOW-UP: 12/15/2020 per DO note: Continue Prozac 20 mg. Follow up visit with Christa See, LCSW this week. Discussed coping strategies at home including breathing exercises and meditation. Recommend trying app such as Calm or Headspace. Follow up in 6 weeks for medication adjustment. No safety concerns.   05/19/2021: Reports anxiety began around March 2022 right around the time of right knee replacement surgery. Recently anxiety worsening related to the murder of her ex-husband's father a couple months ago. Experiencing insomnia. Feels Prozac quit working and no longer taking. Was seeing Education officer, museum and felt good about those sessions, would like referral back. Denies thoughts of self-harm, suicidal ideations, and homicidal ideations.    Patient Active Problem List   Diagnosis Date Noted   Grief 02/06/2021   Non-intractable vomiting 02/06/2021   Arthritis of right knee    S/P knee replacement 01/06/2021   Morning headache 06/02/2017   Leiomyoma of body of uterus 12/24/2016   Prediabetes 11/11/2016   Pap smear abnormality of cervix with ASCUS favoring benign 01/01/2016   COPD (chronic obstructive pulmonary disease) ([place]) 11/18/2015   GERD (gastroesophageal reflux disease) 11/18/2015    Abnormal imaging of thyroid 11/04/2015   COPD exacerbation ([place]) 11/01/2015   Elevated d-dimer 11/01/2015   IDA (iron deficiency anemia) 09/04/2015   Patellofemoral disorder 04/04/2015   Insomnia 04/09/2014   S/P cervical spinal fusion 03/06/2014   Obesity 02/22/2014   Radicular pain in right arm 01/11/2014   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   Other and unspecified hyperlipidemia 08/24/2013   History of CVA (cerebrovascular accident) 05/18/2013   Tobacco abuse, 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     Current Outpatient Medications on File Prior to Visit  Medication Sig Dispense Refill   aspirin 81 MG chewable tablet Chew 1 tablet (81 mg total) by mouth 2 (two) times daily. 60 tablet 0   dexamethasone (DECADRON) 6 MG tablet Take 1 tablet (6 mg total) by mouth 2 (two) times daily. 14 tablet 0   doxycycline (VIBRAMYCIN) 100 MG capsule Take 1 capsule (100 mg total) by mouth 2 (two) times daily. 20 capsule 0   ferrous sulfate 325 (65 FE) MG tablet Take 1 tablet (325 mg total) by mouth every other day. 90 tablet 0   ferrous sulfate 325 (65 FE) MG tablet TAKE 1 TABLET (325 MG TOTAL) BY MOUTH DAILY WITH BREAKFAST. 90 tablet 0   ferumoxytol (FERAHEME) 510 MG/17ML SOLN injection Inject 17 mLs (510 mg total) into the vein once for 1 dose. (Patient not taking: Reported on 02/05/2021) 17 mL 0   ferumoxytol (FERAHEME) 510 MG/17ML SOLN injection INJECT 17 MLS (510 MG TOTAL) INTO THE VEIN ONCE FOR 1 DOSE. 17 mL 0   Fluticasone-Salmeterol 232-14 MCG/ACT AEPB Inhale 1 puff into the lungs in the  morning and at bedtime. 1 each 0   gabapentin (NEURONTIN) 300 MG capsule Take 1 capsule (300 mg total) by mouth 3 (three) times daily. (Patient not taking: Reported on 04/07/2021) 30 capsule 0   HYDROcodone-acetaminophen (NORCO/VICODIN) 5-325 MG tablet 1 PO daily prn pain 30 tablet 0   hydrOXYzine (ATARAX/VISTARIL) 25 MG tablet Take 1 -2 tabs PO QHS PRN for insomnia (Patient not taking: Reported on 04/07/2021) 30 tablet  0   ipratropium (ATROVENT) 0.06 % nasal spray PLACE 2 SPRAYS INTO BOTH NOSTRILS 4 (FOUR) TIMES DAILY. 15 mL 12   methocarbamol (ROBAXIN) 500 MG tablet Take 1 tablet (500 mg total) by mouth every 8 (eight) hours as needed for muscle spasms. (Patient not taking: Reported on 04/07/2021) 30 tablet 0   omeprazole (PRILOSEC) 20 MG capsule Take 20 mg by mouth daily.     ondansetron (ZOFRAN) 4 MG tablet Take 1 tablet (4 mg total) by mouth every 8 (eight) hours as needed for nausea. (Patient not taking: Reported on 04/07/2021) 20 tablet 0   promethazine-dextromethorphan (PROMETHAZINE-DM) 6.25-15 MG/5ML syrup Take 5 mLs by mouth 4 (four) times daily as needed for cough. 140 mL 0   zolpidem (AMBIEN CR) 6.25 MG CR tablet 1 po q d x 21 days prn (Patient not taking: Reported on 04/07/2021) 21 tablet 0   No current facility-administered medications on file prior to visit.    Allergies  Allergen Reactions   Shellfish Allergy Itching and Swelling   Amoxicillin Other (See Comments)    Yeast infection   Latex Itching and Rash    Social History   Socioeconomic History   Marital status: Divorced    Spouse name: Not on file   Number of children: 7   Years of education: GEd   Highest education level: Not on file  Occupational History   Occupation: CNA    Employer: [employer]  Tobacco Use   Smoking status: Former    Packs/day: 0.50    Years: 26.00    Pack years: 13.00    Types: Cigarettes    Quit date: 10/09/2015    Years since quitting: 5.6   Smokeless tobacco: Former  Scientific laboratory technician Use: Never used  Substance and Sexual Activity   Alcohol use: Not Currently    Alcohol/week: 0.0 standard drinks    Comment: occ   Drug use: No   Sexual activity: Not Currently  Other Topics Concern   Not on file  Social History Narrative   Patient lives at home with her family    She has 7 children   5 grown and out of the house   2 at home 49 and 85   She works as a Quarry manager at [employer] Strain: Not on file  Food Insecurity: Not on file  Transportation Needs: Not on file  Physical Activity: Not on file  Stress: Not on file  Social Connections: Not on file  Intimate Partner Violence: Not on file    Family History  Problem Relation Age of Onset   Renal Disease Father        dialysis   Hypertension Father    Heart disease Mother    Heart disease Maternal Grandfather    Asthma Sister    Asthma Maternal Aunt     Past Surgical History:  Procedure Laterality Date   ANTERIOR CERVICAL DECOMP/DISCECTOMY FUSION N/A 03/06/2014   Procedure: ANTERIOR CERVICAL DECOMPRESSION/DISCECTOMY FUSION 2  LEVELS four/five, five/six;  Surgeon: Eustace Moore, MD;  Location: [facility]URO [facility]S;  Service: Neurosurgery;  Laterality: N/A;   CESAREAN SECTION     x 1 with 5th pregnancy   CHOLECYSTECTOMY     KNEE ARTHROSCOPY Right 2015   TEE WITHOUT CARDIOVERSION N/A 05/21/2013   Procedure: TRANSESOPHAGEAL ECHOCARDIOGRAM (TEE);  Surgeon: Sueanne Margarita, MD;  Location: [place];  Service: Cardiovascular;  Laterality: N/A;   TOTAL KNEE ARTHROPLASTY Right 01/06/2021   TOTAL KNEE ARTHROPLASTY Right 01/06/2021   Procedure: RIGHT TOTAL KNEE ARTHROPLASTY;  Surgeon: Meredith Pel, MD;  Location: [place];  Service: Orthopedics;  Laterality: Right;   TUBAL LIGATION      ROS: Review of Systems Negative except as stated above  PHYSICAL EXAM: BP 121/88 (BP Location: Left Arm, Patient Position: Sitting, Cuff Size: Large)   Pulse 98   Temp 97.9 F (36.6 C)   Resp 16   Ht 5' 3.5" (1.613 m)   Wt 199 lb 6.4 oz (90.4 kg)   SpO2 98%   BMI 34.76 kg/m   Physical Exam HENT:     Head: Normocephalic and atraumatic.  Eyes:     Extraocular Movements: Extraocular movements intact.     Conjunctiva/sclera: Conjunctivae normal.     Pupils: Pupils are equal, round, and reactive to light.  Cardiovascular:     Rate and Rhythm: Normal rate and regular rhythm.      Pulses: Normal pulses.     Heart sounds: Normal heart sounds.  Pulmonary:     Effort: Pulmonary effort is normal.     Breath sounds: Normal breath sounds.  Musculoskeletal:     Cervical back: Normal range of motion and neck supple.  Neurological:     General: No focal deficit present.     Mental Status: She is alert and oriented to person, place, and time.  Psychiatric:        Mood and Affect: Mood normal.        Behavior: Behavior normal.   ASSESSMENT AND PLAN: 1. Chronic obstructive pulmonary disease, unspecified COPD type [facility]): - Patient requesting refills on rescue inhalers. Continue Albuterol nebulizer and Albuterol inhaler as prescribed.  - Referral to Pulmonology for further evaluation and management.  - Ambulatory referral to Pulmonology - albuterol (PROVENTIL) (2.5 MG/3ML) 0.083% nebulizer solution; Take 3 mLs by nebulization every 6 (six) hours as needed for wheezing or shortness of breath.  Dispense: 180 mL; Refill: 1 - albuterol (VENTOLIN HFA) 108 (90 Base) MCG/ACT inhaler; Inhale 2 puffs into the lungs every 6 (six) hours as needed for wheezing or shortness of breath.  Dispense: 8 g; Refill: 1  2. Chronic bronchitis, unspecified chronic bronchitis type ([place]): - Azithromycin and Prednisone as prescribed.  - Referral to Pulmonology for further evaluation and management.  - azithromycin (ZITHROMAX) 500 MG tablet; Take 1 tablet (500 mg total) by mouth daily for 7 days.  Dispense: 7 tablet; Refill: 0 - predniSONE (DELTASONE) 10 MG tablet; Take 6 tablets (60 mg total) by mouth daily with breakfast for 1 day, THEN 5 tablets (50 mg total) daily with breakfast for 1 day, THEN 4 tablets (40 mg total) daily with breakfast for 1 day, THEN 3 tablets (30 mg total) daily with breakfast for 1 day, THEN 2 tablets (20 mg total) daily with breakfast for 1 day, THEN 1 tablet (10 mg total) daily with breakfast for 1 day.  Dispense: 21 tablet; Refill: 0 - Ambulatory referral to Pulmonology  3.  Generalized anxiety disorder: -  Patient denies thoughts of self-harm, suicidal ideations, and homicidal ideations.  - Fluoxetine discontinued per patient preference.  - Begin Escitalopram as prescribed.  Do not drink alcohol or use illicit substances with with this medication.  Avoid driving or hazardous activity until you know how this medication will affect you. Your reactions could be impaired. Dizziness or fainting can cause falls, accidents, or severe injuries. Common side effects include drowsiness, nausea, constipation, loss of appetite, dry mouth, increased sweating. Call your provider if you have pounding heartbeats or fluttering in your chest, a light-headed feeling like you may pass out, easy bruising/unusal bleeding, vision change, difficult or painful urination, impotence/sexual problems, liver problems (right-sided upper stomach pain, itching, dark urine, yellowing of skin or eyes/jaundice, low levels of sodium in the body (headache, confusion, slurred speech, severe weakness, vomiting, loss of coordination, feeling unsteady), or manic episodes (racing thoughts, increased energy, decreased need for sleep, risk-taking behavior, being agitated, talkative) Seek medical attention immediately if you have symptoms of serotonin syndrome such as agitation, hallucinations, fever, sweating, shivering, fast heart rate, muscle stiffness, twitching, loss of coordination, nausea, vomiting, or diarrhea Report any new or worsening symptoms to your provider, such as but not limited to: mood or behavior changes, anxiety, panic attacks, trouble sleeping, or if you feel impulsive, irritable, agitated, hostile, aggressive, restless, hyperactive (mentally or physically), more depressed, or have thoughts about suicide or hurting yourself - Referral to social worker Asante McCoy, LCSW for counseling services.  - Follow-up with primary provider in 4 weeks or sooner if needed.  - escitalopram (LEXAPRO) 10 MG tablet;  Take 1 tablet (10 mg total) by mouth at bedtime.  Dispense: 30 tablet; Refill: 0  Patient was given the opportunity to ask questions.  Patient verbalized understanding of the plan and was able to repeat key elements of the plan. Patient was given clear instructions to go to Emergency Department or return to medical center if symptoms don't improve, worsen, or new problems develop.The patient verbalized understanding.   Orders Placed This Encounter  Procedures   Ambulatory referral to Pulmonology     Requested Prescriptions   Signed Prescriptions Disp Refills   escitalopram (LEXAPRO) 10 MG tablet 30 tablet 0    Sig: Take 1 tablet (10 mg total) by mouth at bedtime.   azithromycin (ZITHROMAX) 500 MG tablet 7 tablet 0    Sig: Take 1 tablet (500 mg total) by mouth daily for 7 days.   predniSONE (DELTASONE) 10 MG tablet 21 tablet 0    Sig: Take 6 tablets (60 mg total) by mouth daily with breakfast for 1 day, THEN 5 tablets (50 mg total) daily with breakfast for 1 day, THEN 4 tablets (40 mg total) daily with breakfast for 1 day, THEN 3 tablets (30 mg total) daily with breakfast for 1 day, THEN 2 tablets (20 mg total) daily with breakfast for 1 day, THEN 1 tablet (10 mg total) daily with breakfast for 1 day.   albuterol (PROVENTIL) (2.5 MG/3ML) 0.083% nebulizer solution 150 mL 1    Sig: Take 3 mLs by nebulization every 6 (six) hours as needed for wheezing or shortness of breath.   albuterol (VENTOLIN HFA) 108 (90 Base) MCG/ACT inhaler 8 g 1    Sig: Inhale 2 puffs into the lungs every 6 (six) hours as needed for wheezing or shortness of breath.    Return in about 4 weeks (around 06/16/2021) for Follow-Up anxiety .  Camillia Herter, NP

## 2021-05-19 ENCOUNTER — Other Ambulatory Visit: Payer: Self-pay

## 2021-05-19 ENCOUNTER — Ambulatory Visit (INDEPENDENT_AMBULATORY_CARE_PROVIDER_SITE_OTHER): Payer: PRIVATE HEALTH INSURANCE | Admitting: Family

## 2021-05-19 ENCOUNTER — Encounter: Payer: Self-pay | Admitting: Family

## 2021-05-19 VITALS — BP 121/88 | HR 98 | Temp 97.9°F | Resp 16 | Ht 63.5 in | Wt 199.4 lb

## 2021-05-19 DIAGNOSIS — F411 Generalized anxiety disorder: Secondary | ICD-10-CM | POA: Diagnosis not present

## 2021-05-19 DIAGNOSIS — J42 Unspecified chronic bronchitis: Secondary | ICD-10-CM

## 2021-05-19 DIAGNOSIS — J449 Chronic obstructive pulmonary disease, unspecified: Secondary | ICD-10-CM | POA: Diagnosis not present

## 2021-05-19 MED ORDER — PREDNISONE 10 MG PO TABS: ORAL_TABLET | ORAL | 0 refills | Status: AC

## 2021-05-19 MED ORDER — ESCITALOPRAM OXALATE 10 MG PO TABS: 10.0000 mg | ORAL_TABLET | Freq: Every day | ORAL | 0 refills | Status: DC

## 2021-05-19 MED ORDER — ALBUTEROL SULFATE HFA 108 (90 BASE) MCG/ACT IN AERS: 2.0000 | INHALATION_SPRAY | Freq: Four times a day (QID) | RESPIRATORY_TRACT | 1 refills | Status: DC | PRN

## 2021-05-19 MED ORDER — AZITHROMYCIN 500 MG PO TABS: 500.0000 mg | ORAL_TABLET | Freq: Every day | ORAL | 0 refills | Status: AC

## 2021-05-19 MED ORDER — ALBUTEROL SULFATE (2.5 MG/3ML) 0.083% IN NEBU
3.0000 mL | INHALATION_SOLUTION | Freq: Four times a day (QID) | RESPIRATORY_TRACT | 1 refills | Status: DC | PRN
  Filled 2021-05-19 – 2021-08-11 (×3): qty 180, 15d supply, fill #0

## 2021-05-19 NOTE — Patient Instructions (Signed)
[email or web address]">  Chronic Bronchitis, Adult  Chronic bronchitis is inflammation of the large airways (bronchial tubes) that carry air to the lungs. The inflammation causes more mucus (sputum) to build up. The inflammation and mucus make it hard to breathe. Thiscondition is a type of chronic obstructive pulmonary disease (COPD). Chronic bronchitis is a long-term (chronic) condition. It is defined as a chronic cough with sputum production: For at least 3 months of the year. For 2 years in a row. People with chronic bronchitis are more likely to get colds and otherinfections in the nose, throat, or airways. What are the causes? This condition is most often caused by: A history of smoking. Exposure to secondhand smoke or a smoky area for a long period of time. Lung problems, such as emphysema, asthma, bronchiectasis, or cystic fibrosis. Long-term exposure to certain fumes or chemicals that irritate the lungs. Infection. What are the signs or symptoms? Symptoms of chronic bronchitis may include: A cough that brings up mucus (productive cough). Shortness of breath. A whistling sound when you breathe (wheezing). Chest tightness. Colds or respiratory infections that go away and return. How is this diagnosed? This condition may be diagnosed based on: Your symptoms and medical history. A physical exam. Tests, such as: Testing a sputum sample. Blood tests. A chest X-ray. Tests of lung (pulmonary) function. How is this treated? There is no cure for chronic bronchitis. Treatment may help control your symptoms. This includes: Drinking fluids. This may help thin your mucus so it is easier to cough up. Mucus-clearing techniques. Taking medicine prescribed by your health care provider, such as: Inhaled medicine to improve airflow in and out of your lungs. Antibiotics to treat or prevent bacterial lung infections. Using oxygen therapy, if your blood oxygen level is  very low. Pulmonary rehabilitation. This is a program that helps you learn how to manage your breathing problem. The program may include exercise, education, counseling, treatment, and support. Follow these instructions at home:  Medicines Take over-the-counter and prescription medicines only as told by your health care provider. If you were prescribed an antibiotic medicine, take it as told by your health care provider. Do not stop taking the antibiotic even if you start to feel better. Lifestyle  Do not use any products that contain nicotine or tobacco, such as cigarettes, e-cigarettes, and chewing tobacco. If you need help quitting, ask your health care provider. Stay away from other people's smoke (secondhand smoke) and any irritants that make you cough more, such as chemical fumes. Eat a healthy diet and get regular exercise. Talk with your health care provider about what activities are safe for you.  Preventing infections Stay up to date on all immunizations, including the pneumonia and flu vaccines. Wash your hands often with soap and water for at least 20 seconds. If soap and water are not available, use hand sanitizer. Avoid contact with people who have symptoms of a cold or the flu. General instructions Drink enough fluids to keep your urine pale yellow. Use oxygen therapy at home as directed. Follow instructions from your health care provider about how to use oxygen safely and take steps to prevent fire. Do not smoke while using oxygen or allow others to smoke in your home. Keep all follow-up visits as told by your health care provider. This is important. Contact a health care provider if: Your shortness of breath or coughing gets worse even when you take medicine. Your mucus gets thicker or changes color. You are not able to cough up your  mucus. You have a fever. Get help right away if: You have trouble breathing. You have chest pain. You feel dizzy or confused. These  symptoms may represent a serious problem that is an emergency. Do not wait to see if the symptoms will go away. Get medical help right away. Call your local emergency services (911 in the [place] Do not drive yourself to the hospital. Summary Chronic bronchitis is inflammation of the large airways (bronchial tubes) that carry air to the lungs. The inflammation causes mucus (sputum) to build up. The inflammation and mucus make it hard to breathe. If you were prescribed an antibiotic medicine, take it as told by your health care provider. Do not stop taking the antibiotic even if you start to feel better. Drink enough fluids to keep your urine pale yellow. Drinking fluids may help thin your mucus so it is easier to cough up. Do not use any products that contain nicotine or tobacco, such as cigarettes, e-cigarettes, and chewing tobacco. If you need help quitting, ask your health care provider. This information is not intended to replace advice given to you by your health care provider. Make sure you discuss any questions you have with your healthcare provider. Document Revised: 12/10/2019 Document Reviewed: 12/11/2019 Elsevier Patient Education  2022 [employer].

## 2021-05-19 NOTE — Progress Notes (Signed)
Pt presents for cough that has been going on for about week and half.states that she is having bronchitis flare. Symptoms include vomiting and insomnia Pt needs refill on nebulizer solution needs to be sent to [facility] pharmacy and albuterol inhaler sent to [facility]

## 2021-05-20 ENCOUNTER — Inpatient Hospital Stay: Payer: PRIVATE HEALTH INSURANCE | Admitting: Hematology and Oncology

## 2021-05-20 ENCOUNTER — Inpatient Hospital Stay: Payer: PRIVATE HEALTH INSURANCE | Attending: Hematology and Oncology

## 2021-05-20 ENCOUNTER — Other Ambulatory Visit: Payer: Self-pay | Admitting: Hematology and Oncology

## 2021-05-20 ENCOUNTER — Ambulatory Visit: Payer: PRIVATE HEALTH INSURANCE | Admitting: Surgical

## 2021-05-20 DIAGNOSIS — D500 Iron deficiency anemia secondary to blood loss (chronic): Secondary | ICD-10-CM

## 2021-05-20 NOTE — Progress Notes (Signed)
No show

## 2021-05-26 ENCOUNTER — Other Ambulatory Visit: Payer: Self-pay

## 2021-05-29 ENCOUNTER — Other Ambulatory Visit: Payer: Self-pay

## 2021-06-03 ENCOUNTER — Other Ambulatory Visit: Payer: Self-pay

## 2021-06-03 ENCOUNTER — Ambulatory Visit (INDEPENDENT_AMBULATORY_CARE_PROVIDER_SITE_OTHER): Payer: PRIVATE HEALTH INSURANCE | Admitting: Orthopedic Surgery

## 2021-06-03 ENCOUNTER — Encounter: Payer: Self-pay | Admitting: Orthopedic Surgery

## 2021-06-03 DIAGNOSIS — Z96651 Presence of right artificial knee joint: Secondary | ICD-10-CM

## 2021-06-03 NOTE — Progress Notes (Signed)
Office Visit Note   Patient: [name]           Date of Birth: 1971/08/08           MRN: [id number] Visit Date: 06/03/2021 Requested by: Nicolette Bang, [address], [place],  [place] [address] PCP: Nicolette Bang, MD  Subjective: Chief Complaint  Patient presents with   Right Knee - Routine Post Op    HPI: [name] is a 50 y.o. female who presents to the office s/p right total knee arthroplasty on 01/06/2021.  Patient reports that she finished physical therapy.  She is walking well without any instability or mechanical symptoms.  She does note burning along the incision and lateral to the incision with some continued numbness on the lateral aspect of the incision.  She does report excellent pain relief and denies any fevers, chills, night sweats, drainage from the incision..                ROS: All systems reviewed are negative as they relate to the chief complaint within the history of present illness.  Patient denies fevers or chills.  Assessment & Plan: Visit Diagnoses:  1. Status post total right knee replacement     Plan: Patient is a 50 year old female who presents almost 5 months out from right total knee arthroplasty.  She is doing very well and has excellent pain relief.  No sign of instability and she has excellent strength on exam today.  Plan to have patient follow-up as needed.  Cautioned her against kneeling on the knee and any running on the knee.  Also recommended she call the office for dose of antibiotics prior to any dental procedures.  She understands that the burning and tingling she is having is likely temporary and maximum medical improvement is a year out from procedure.  She will follow-up with the office as needed.  Follow-Up Instructions: No follow-ups on file.   Orders:  No orders of the defined types were placed in this encounter.  No orders of the defined types were placed in this encounter.     Procedures: No  procedures performed   Clinical Data: No additional findings.  Objective: Vital Signs: There were no vitals taken for this visit.  Physical Exam:  Constitutional: Patient appears well-developed HEENT:  Head: Normocephalic Eyes:EOM are normal Neck: Normal range of motion Cardiovascular: Normal rate Pulmonary/chest: Effort normal Neurologic: Patient is alert Skin: Skin is warm Psychiatric: Patient has normal mood and affect  Ortho Exam: Ortho exam demonstrates right knee with well-healed incision from prior total knee arthroplasty.  0 degrees extension and 115 degrees of knee flexion.  No calf tenderness.  Negative Homans' sign.  Able to perform straight leg raise with 5/5 strength of quadricep and hamstring.  No gross mid flexion instability.  Patient is able to walk without any antalgia.  No significant effusion or abnormal warmth of the knee  Specialty Comments:  No specialty comments available.  Imaging: No results found.   PMFS History: Patient Active Problem List   Diagnosis Date Noted   Grief 02/06/2021   Non-intractable vomiting 02/06/2021   Arthritis of right knee    S/P knee replacement 01/06/2021   Morning headache 06/02/2017   Leiomyoma of body of uterus 12/24/2016   Prediabetes 11/11/2016   Pap smear abnormality of cervix with ASCUS favoring benign 01/01/2016   COPD (chronic obstructive pulmonary disease) ([place]) 11/18/2015   GERD (gastroesophageal reflux disease) 11/18/2015   Abnormal  imaging of thyroid 11/04/2015   COPD exacerbation ([place]) 11/01/2015   Elevated d-dimer 11/01/2015   IDA (iron deficiency anemia) 09/04/2015   Patellofemoral disorder 04/04/2015   Insomnia 04/09/2014   S/P cervical spinal fusion 03/06/2014   Obesity 02/22/2014   Radicular pain in right arm 01/11/2014   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   Other and unspecified hyperlipidemia 08/24/2013   History of CVA (cerebrovascular accident) 05/18/2013   Tobacco abuse, in  remission 05/18/2013   Past Medical History:  Diagnosis Date   Anemia    Anxiety    Arthritis    right knee, back   COPD (chronic obstructive pulmonary disease) (HCC)    GERD (gastroesophageal reflux disease)    Headache(784.0)    History of blood transfusion    Hx; of in 1991 after delivery   History of TIAs    Numbness    Right hand   Pneumonia    Stroke ([place]) 05/2013    Family History  Problem Relation Age of Onset   Renal Disease Father        dialysis   Hypertension Father    Heart disease Mother    Heart disease Maternal Grandfather    Asthma Sister    Asthma Maternal Aunt     Past Surgical History:  Procedure Laterality Date   ANTERIOR CERVICAL DECOMP/DISCECTOMY FUSION N/A 03/06/2014   Procedure: ANTERIOR CERVICAL DECOMPRESSION/DISCECTOMY FUSION 2 LEVELS four/five, five/six;  Surgeon: Eustace Moore, MD;  Location: [facility] NEURO ORS;  Service: Neurosurgery;  Laterality: N/A;   CESAREAN SECTION     x 1 with 5th pregnancy   CHOLECYSTECTOMY     KNEE ARTHROSCOPY Right 2015   TEE WITHOUT CARDIOVERSION N/A 05/21/2013   Procedure: TRANSESOPHAGEAL ECHOCARDIOGRAM (TEE);  Surgeon: Sueanne Margarita, MD;  Location: [facility];  Service: Cardiovascular;  Laterality: N/A;   TOTAL KNEE ARTHROPLASTY Right 01/06/2021   TOTAL KNEE ARTHROPLASTY Right 01/06/2021   Procedure: RIGHT TOTAL KNEE ARTHROPLASTY;  Surgeon: Meredith Pel, MD;  Location: [facility];  Service: Orthopedics;  Laterality: Right;   TUBAL LIGATION     Social History   Occupational History   Occupation: Forensic psychologist: [employer]  Tobacco Use   Smoking status: Former    Packs/day: 0.50    Years: 26.00    Pack years: 13.00    Types: Cigarettes    Quit date: 10/09/2015    Years since quitting: 5.6   Smokeless tobacco: Former  Scientific laboratory technician Use: Never used  Substance and Sexual Activity   Alcohol use: Not Currently    Alcohol/week: 0.0 standard drinks    Comment: occ   Drug use: No   Sexual  activity: Not Currently

## 2021-06-05 ENCOUNTER — Other Ambulatory Visit: Payer: Self-pay

## 2021-06-09 ENCOUNTER — Telehealth: Payer: Self-pay | Admitting: Clinical

## 2021-06-25 NOTE — Telephone Encounter (Signed)
Third attempt to reach pt. No answer, left vm. Will send message in mychart.

## 2021-06-29 ENCOUNTER — Encounter: Payer: Self-pay | Admitting: Family

## 2021-06-29 NOTE — Telephone Encounter (Signed)
On 05/19/2021 per Maren Reamer referral  placed in [facility] for chronic bronchitis and COPD. Patient may want to call their office to check on the status of appointment.  Address: [address] [place] [address] Phone Number: [phone]  Patient welcome to schedule an appointment with me or Dorna Mai, MD in regards to persistent bronchitis symptoms.   Otherwise would encourage patient to report to the Urgent Care for immediate evaluation.

## 2021-06-30 ENCOUNTER — Ambulatory Visit
Admission: EM | Admit: 2021-06-30 | Discharge: 2021-06-30 | Disposition: A | Discharge status: Home / Self Care | Payer: PRIVATE HEALTH INSURANCE | Attending: Internal Medicine | Admitting: Internal Medicine

## 2021-06-30 ENCOUNTER — Other Ambulatory Visit: Payer: Self-pay

## 2021-06-30 ENCOUNTER — Ambulatory Visit: Payer: PRIVATE HEALTH INSURANCE

## 2021-06-30 ENCOUNTER — Telehealth: Payer: Self-pay | Admitting: Internal Medicine

## 2021-06-30 DIAGNOSIS — R059 Cough, unspecified: Secondary | ICD-10-CM

## 2021-06-30 DIAGNOSIS — H65191 Other acute nonsuppurative otitis media, right ear: Secondary | ICD-10-CM

## 2021-06-30 DIAGNOSIS — J069 Acute upper respiratory infection, unspecified: Secondary | ICD-10-CM

## 2021-06-30 DIAGNOSIS — R062 Wheezing: Secondary | ICD-10-CM

## 2021-06-30 MED ORDER — FLUCONAZOLE 150 MG PO TABS: 150.0000 mg | ORAL_TABLET | Freq: Every day | ORAL | 0 refills | Status: DC

## 2021-06-30 MED ORDER — BENZONATATE 100 MG PO CAPS: 100.0000 mg | ORAL_CAPSULE | Freq: Three times a day (TID) | ORAL | 0 refills | Status: DC | PRN

## 2021-06-30 MED ORDER — AMOXICILLIN-POT CLAVULANATE 875-125 MG PO TABS: 1.0000 | ORAL_TABLET | Freq: Two times a day (BID) | ORAL | 0 refills | Status: DC

## 2021-06-30 MED ORDER — PREDNISONE 10 MG (21) PO TBPK: ORAL_TABLET | Freq: Every day | ORAL | 0 refills | Status: DC

## 2021-06-30 NOTE — Discharge Instructions (Addendum)
You are being treated with augmentin antibiotic and prednisone steroid to help with cough and treat right ear infection. You have also been prescribed a cough medication to take as needed. Please follow up if needed.

## 2021-06-30 NOTE — ED Provider Notes (Signed)
[facility]    CSN: [id number] Arrival date & time: 06/30/21  0918      History   Chief Complaint Chief Complaint  Patient presents with   Cough   appt 10    HPI [name] is a 50 y.o. female.   Patient presents with cough, nasal congestion, and wheezing that has been present for 1 week. Cough is productive with white colored sputum. Denies any known fevers or sick contacts. Had a negative at home covid 19 test recently. Has had some occasional wheezing and shortness of breath only when she coughs. Denies any current shortness of breath or chest pain. Has taken OTC cough medication and tylenol without relief of symptoms. States that cough is harsh and severe. Has been using albuterol inhaler and nebulizer with no relief as well. Has history of bronchitis.    Cough  Past Medical History:  Diagnosis Date   Anemia    Anxiety    Arthritis    right knee, back   COPD (chronic obstructive pulmonary disease) (HCC)    GERD (gastroesophageal reflux disease)    Headache(784.0)    History of blood transfusion    Hx; of in 1991 after delivery   History of TIAs    Numbness    Right hand   Pneumonia    Stroke ([place]) 05/2013    Patient Active Problem List   Diagnosis Date Noted   Grief 02/06/2021   Non-intractable vomiting 02/06/2021   Arthritis of right knee    S/P knee replacement 01/06/2021   Morning headache 06/02/2017   Leiomyoma of body of uterus 12/24/2016   Prediabetes 11/11/2016   Pap smear abnormality of cervix with ASCUS favoring benign 01/01/2016   COPD (chronic obstructive pulmonary disease) ([place]) 11/18/2015   GERD (gastroesophageal reflux disease) 11/18/2015   Abnormal imaging of thyroid 11/04/2015   COPD exacerbation ([place]) 11/01/2015   Elevated d-dimer 11/01/2015   IDA (iron deficiency anemia) 09/04/2015   Patellofemoral disorder 04/04/2015   Insomnia 04/09/2014   S/P cervical spinal fusion 03/06/2014   Obesity 02/22/2014   Radicular pain  in right arm 01/11/2014   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   Other and unspecified hyperlipidemia 08/24/2013   History of CVA (cerebrovascular accident) 05/18/2013   Tobacco abuse, 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    Past Surgical History:  Procedure Laterality Date   ANTERIOR CERVICAL DECOMP/DISCECTOMY FUSION N/A 03/06/2014   Procedure: ANTERIOR CERVICAL DECOMPRESSION/DISCECTOMY FUSION 2 LEVELS four/five, five/six;  Surgeon: Eustace Moore, MD;  Location: [facility] NEURO ORS;  Service: Neurosurgery;  Laterality: N/A;   CESAREAN SECTION     x 1 with 5th pregnancy   CHOLECYSTECTOMY     KNEE ARTHROSCOPY Right 2015   TEE WITHOUT CARDIOVERSION N/A 05/21/2013   Procedure: TRANSESOPHAGEAL ECHOCARDIOGRAM (TEE);  Surgeon: Sueanne Margarita, MD;  Location: [facility];  Service: Cardiovascular;  Laterality: N/A;   TOTAL KNEE ARTHROPLASTY Right 01/06/2021   TOTAL KNEE ARTHROPLASTY Right 01/06/2021   Procedure: RIGHT TOTAL KNEE ARTHROPLASTY;  Surgeon: Meredith Pel, MD;  Location: [facility];  Service: Orthopedics;  Laterality: Right;   TUBAL LIGATION      OB History   No obstetric history on file.      Home Medications    Prior to Admission medications   Medication Sig Start Date End Date Taking? Authorizing Provider  amoxicillin-clavulanate (AUGMENTIN) 875-125 MG tablet Take 1 tablet by mouth every 12 (twelve) hours. 06/30/21  Yes Odis Luster, FNP  benzonatate (TESSALON)  100 MG capsule Take 1 capsule (100 mg total) by mouth every 8 (eight) hours as needed for cough. 06/30/21  Yes Odis Luster, FNP  fluconazole (DIFLUCAN) 150 MG tablet Take 1 tablet (150 mg total) by mouth daily. 06/30/21  Yes Odis Luster, FNP  predniSONE (STERAPRED UNI-PAK 21 TAB) 10 MG (21) TBPK tablet Take by mouth daily. Take 6 tabs by mouth daily  for 2 days, then 5 tabs for 2 days, then 4 tabs for 2 days, then 3 tabs for 2 days, 2 tabs for 2 days, then 1 tab by mouth daily for 2 days 06/30/21  Yes Odis Luster, FNP   albuterol (PROVENTIL) (2.5 MG/3ML) 0.083% nebulizer solution Take 3 mLs by nebulization every 6 (six) hours as needed for wheezing or shortness of breath. 05/19/21 08/17/21  Camillia Herter, NP  albuterol (VENTOLIN HFA) 108 (90 Base) MCG/ACT inhaler Inhale 2 puffs into the lungs every 6 (six) hours as needed for wheezing or shortness of breath. 05/19/21   Camillia Herter, NP  aspirin 81 MG chewable tablet Chew 1 tablet (81 mg total) by mouth 2 (two) times daily. 01/07/21   Magnant, Charles L, PA-C  escitalopram (LEXAPRO) 10 MG tablet Take 1 tablet (10 mg total) by mouth at bedtime. 05/19/21 06/18/21  Camillia Herter, NP  ferrous sulfate 325 (65 FE) MG tablet Take 1 tablet (325 mg total) by mouth every other day. 12/15/20   Nicolette Bang, MD  ferrous sulfate 325 (65 FE) MG tablet TAKE 1 TABLET (325 MG TOTAL) BY MOUTH DAILY WITH BREAKFAST. 11/19/20 11/19/21  Camillia Herter, NP  ferumoxytol [facility]) 510 MG/17ML SOLN injection Inject 17 mLs (510 mg total) into the vein once for 1 dose. Patient not taking: Reported on 02/05/2021 11/28/20 11/28/20  Camillia Herter, NP  ferumoxytol [facility]) 510 MG/17ML SOLN injection INJECT 17 MLS (510 MG TOTAL) INTO THE VEIN ONCE FOR 1 DOSE. 11/20/20 11/20/21  Camillia Herter, NP  Fluticasone-Salmeterol 232-14 MCG/ACT AEPB Inhale 1 puff into the lungs in the morning and at bedtime. 03/06/21   Mar Daring, PA-C  ipratropium (ATROVENT) 0.06 % nasal spray PLACE 2 SPRAYS INTO BOTH NOSTRILS 4 (FOUR) TIMES DAILY. 11/11/20 11/11/21  Nicolette Bang, MD  omeprazole (PRILOSEC) 20 MG capsule Take 20 mg by mouth daily.    [provider]  promethazine-dextromethorphan (PROMETHAZINE-DM) 6.25-15 MG/5ML syrup Take 5 mLs by mouth 4 (four) times daily as needed for cough. 04/07/21   Scot Jun, FNP    Family History Family History  Problem Relation Age of Onset   Renal Disease Father        dialysis   Hypertension Father    Heart disease Mother     Heart disease Maternal Grandfather    Asthma Sister    Asthma Maternal Aunt     Social History Social History   Tobacco Use   Smoking status: Former    Packs/day: 0.50    Years: 26.00    Pack years: 13.00    Types: Cigarettes    Quit date: 10/09/2015    Years since quitting: 5.7   Smokeless tobacco: Former  Scientific laboratory technician Use: Never used  Substance Use Topics   Alcohol use: Not Currently    Alcohol/week: 0.0 standard drinks    Comment: occ   Drug use: No     Allergies   Shellfish allergy, Amoxicillin, and Latex   Review of Systems Review of Systems  Respiratory:  Positive for cough.   Per HPI  Physical Exam Triage Vital Signs ED Triage Vitals  Enc Vitals Group     BP 06/30/21 0952 (!) 156/95     Pulse Rate 06/30/21 0952 90     Resp 06/30/21 0952 18     Temp 06/30/21 0952 98.1 F (36.7 C)     Temp Source 06/30/21 0952 Oral     SpO2 06/30/21 0952 96 %     Weight --      Height --      Head Circumference --      Peak Flow --      Pain Score 06/30/21 0953 0     Pain Loc --      Pain Edu? --      Excl. in [place]? --    No data found.  Updated Vital Signs BP (!) 156/95 (BP Location: Right Arm)   Pulse 90   Temp 98.1 F (36.7 C) (Oral)   Resp 18   SpO2 96%   Visual Acuity Right Eye Distance:   Left Eye Distance:   Bilateral Distance:    Right Eye Near:   Left Eye Near:    Bilateral Near:     Physical Exam Constitutional:      General: She is not in acute distress.    Appearance: Normal appearance.  HENT:     Head: Normocephalic and atraumatic.     Right Ear: Ear canal normal. Tympanic membrane is erythematous. Tympanic membrane is not bulging.     Left Ear: Ear canal normal. A middle ear effusion is present. Tympanic membrane is not erythematous or bulging.     Nose: Congestion present.     Mouth/Throat:     Mouth: Mucous membranes are moist.     Pharynx: Oropharynx is clear. No posterior oropharyngeal erythema.  Eyes:     Extraocular  Movements: Extraocular movements intact.     Conjunctiva/sclera: Conjunctivae normal.     Pupils: Pupils are equal, round, and reactive to light.  Cardiovascular:     Rate and Rhythm: Normal rate and regular rhythm.     Pulses: Normal pulses.     Heart sounds: Normal heart sounds.  Pulmonary:     Effort: Pulmonary effort is normal. No respiratory distress.     Breath sounds: Normal breath sounds. No wheezing or rhonchi.     Comments: Harsh cough on exam Abdominal:     General: Abdomen is flat. Bowel sounds are normal.     Palpations: Abdomen is soft.  Musculoskeletal:        General: Normal range of motion.     Cervical back: Normal range of motion.  Skin:    General: Skin is warm and dry.  Neurological:     General: No focal deficit present.     Mental Status: She is alert and oriented to person, place, and time. Mental status is at baseline.  Psychiatric:        Mood and Affect: Mood normal.        Behavior: Behavior normal.     UC Treatments / Results  Labs (all labs ordered are listed, but only abnormal results are displayed) Labs Reviewed - No data to display  EKG   Radiology No results found.  Procedures Procedures (including critical care time)  Medications Ordered in UC Medications - No data to display  Initial Impression / Assessment and Plan / UC Course  I have reviewed the triage vital signs and the nursing  notes.  Pertinent labs & imaging results that were available during my care of the patient were reviewed by me and considered in my medical decision making (see chart for details).     Will treat right ear infection and respiratory infection with Augmentin antibiotic. Patient denies allergy to penicillin but states that it causes yeast infection so will send diflucan as well. Suggested chest x-ray but patient declined. Prednisone steroid taper to decrease inflammation in chest, help with cough, and help alleviate wheezing. Benzonatate as needed for  cough as well. Patient is not in any acute distress and does not need immediate medical attention at the hospital at this time. Advised patient to go to the hospital if symptoms worsen. Discussed strict return precautions. Patient verbalized understanding and is agreeable with plan.  Final Clinical Impressions(s) / UC Diagnoses   Final diagnoses:  Cough  Wheezing  Other non-recurrent acute nonsuppurative otitis media of right ear  Acute upper respiratory infection     Discharge Instructions      You are being treated with augmentin antibiotic and prednisone steroid to help with cough and treat right ear infection. You have also been prescribed a cough medication to take as needed. Please follow up if needed.      ED Prescriptions     Medication Sig Dispense Auth. Provider   amoxicillin-clavulanate (AUGMENTIN) 875-125 MG tablet Take 1 tablet by mouth every 12 (twelve) hours. 14 tablet Phillips Odor E, FNP   predniSONE (STERAPRED UNI-PAK 21 TAB) 10 MG (21) TBPK tablet Take by mouth daily. Take 6 tabs by mouth daily  for 2 days, then 5 tabs for 2 days, then 4 tabs for 2 days, then 3 tabs for 2 days, 2 tabs for 2 days, then 1 tab by mouth daily for 2 days 42 tablet Odis Luster, FNP   benzonatate (TESSALON) 100 MG capsule Take 1 capsule (100 mg total) by mouth every 8 (eight) hours as needed for cough. 21 capsule Odis Luster, FNP   fluconazole (DIFLUCAN) 150 MG tablet Take 1 tablet (150 mg total) by mouth daily. 1 tablet Odis Luster, FNP      PDMP not reviewed this encounter.   Odis Luster, FNP 06/30/21 1106

## 2021-06-30 NOTE — Telephone Encounter (Signed)
Spoke with pt and scheduled appt for 07/21/21 at 9:00

## 2021-06-30 NOTE — Telephone Encounter (Signed)
Patient came in the office after being seen at the urgent care and stated she needs a referral to a pulmonologist

## 2021-06-30 NOTE — ED Triage Notes (Signed)
Pt c/o productive cough with white sputum for over a week. States hx of bronchitis. States coughing until she vomits. States when coughing she gets wheezing and SOB. States using her neb txs and inhaler with no relief.

## 2021-07-01 NOTE — Telephone Encounter (Signed)
As previously noted in patient's chart on 06/29/2021. Please refer to the following:  On 05/19/2021 per Maren Reamer referral  placed in [facility] for chronic bronchitis and COPD. Patient may want to call their office to check on the status of appointment.  Address: [address] [place] [address] Phone Number: [phone]  Patient welcome to schedule an appointment with me or Dorna Mai, MD if she would like to discuss more.

## 2021-07-21 ENCOUNTER — Other Ambulatory Visit: Payer: Self-pay

## 2021-07-21 ENCOUNTER — Telehealth: Payer: Self-pay | Admitting: Internal Medicine

## 2021-07-21 ENCOUNTER — Encounter: Payer: Self-pay | Admitting: Clinical

## 2021-07-21 NOTE — Telephone Encounter (Signed)
Patient called requesting to speak with the social worker. Please f/u

## 2021-07-24 NOTE — Telephone Encounter (Signed)
Spoke with pt and scheduled appt for 07/28/21 at 11:30

## 2021-07-28 ENCOUNTER — Other Ambulatory Visit: Payer: Self-pay

## 2021-07-28 ENCOUNTER — Ambulatory Visit (INDEPENDENT_AMBULATORY_CARE_PROVIDER_SITE_OTHER): Payer: Self-pay | Admitting: Clinical

## 2021-07-28 DIAGNOSIS — F4323 Adjustment disorder with mixed anxiety and depressed mood: Secondary | ICD-10-CM

## 2021-07-29 NOTE — BH Specialist Note (Signed)
Integrated Behavioral Health via Telemedicine Visit  07/29/2021 ANMOL PASCHEN [id number]  Number of [place] visits: 1/6 Session Start time: 11:45am  Session End time: 12:30pm Total time: 45   Referring Provider: Durene Fruits, NP Patient/Family location: [facility] Provider location: [facility] All persons participating in visit: Pt and LCSWA Types of Service: Individual psychotherapy and Video visit  I connected with [name] via Video Enabled Telemedicine Application  (Video is Caregility application) and verified that I am speaking with the correct person using two identifiers. Discussed confidentiality: Yes   I discussed the limitations of telemedicine and the availability of in person appointments.  Discussed there is a possibility of technology failure and discussed alternative modes of communication if that failure occurs.  I discussed that engaging in this telemedicine visit, they consent to the provision of behavioral healthcare and the services will be billed under their insurance.  Patient and/or legal guardian expressed understanding and consented to Telemedicine visit: Yes   Presenting Concerns: Patient and/or family reports the following symptoms/concerns: Reports decreased interest in activities, feeling depressed, difficulty sleeping, appetite changes, self-esteem disturbance, difficulty concentrating, anxiousness, excessive worrying, difficulty relaxing, racing thoughts, and irritability. Reports that two of her children's fathers recently passed away. Reports that she has had to constantly been there for her her children and family and is often overwhelmed. Reports that she has been caring for others since childhood.  Duration of problem: 1 year; Severity of problem: moderate  Patient and/or Family's Strengths/Protective Factors: Social and Emotional competence  Goals Addressed: Patient will:  Reduce symptoms of: anxiety and depression    Increase knowledge and/or ability of: coping skills and healthy habits   Demonstrate ability to: Increase healthy adjustment to current life circumstances  Progress towards Goals: Ongoing  Interventions: Interventions utilized:  CBT Cognitive Behavioral Therapy, Supportive Counseling, and Psychoeducation and/or Health Education Standardized Assessments completed: GAD-7 and PHQ 9  Patient and/or Family Response: Pt receptive to tx. Pt receptive to psychoeducation provided on anxiety, grief, and depression. Pt receptive to cognitive restructuring utilized to improve pt's cognitive processing skills. Pt will begin utilizing deep breathing exercises and establishing healthy boundaries with family.   Assessment: Denies SI/HI. Denies auditory/visual hallucinations. No safety risks. Patient currently experiencing anxiety and depression related to grief. Pt is also overwhelmed with having to be there for family and minimal support for herself. Pt appears to have difficulty with establishing healthy boundaries with family. Pt has been caring for family since childhood.  .   Patient may benefit from brief therapy. LCSWA provided psychoeducation on anxiety, grief, and depression. LCSWA utilized cognitive restructuring in order to improve pt's cognitive processing skills. LCSWA encouraged pt to utilize deep breathing exercises and establish healthy boundaries with family. LCSWA will fu with pt. .  Plan: Follow up with behavioral health clinician on : 08/11/21 Behavioral recommendations: Utilize deep breathing exercises and establish healthy boundaries with family.  Referral(s): [facility] (In Clinic)  I discussed the assessment and treatment plan with the patient and/or parent/guardian. They were provided an opportunity to ask questions and all were answered. They agreed with the plan and demonstrated an understanding of the instructions.   They were advised to call back or  seek an in-person evaluation if the symptoms worsen or if the condition fails to improve as anticipated.  Deannie Resetar C Coner Gibbard, LCSW

## 2021-08-11 ENCOUNTER — Encounter: Payer: Self-pay | Admitting: Clinical

## 2021-08-11 ENCOUNTER — Other Ambulatory Visit: Payer: Self-pay

## 2021-08-11 ENCOUNTER — Other Ambulatory Visit: Payer: Self-pay | Admitting: Family

## 2021-08-11 DIAGNOSIS — F411 Generalized anxiety disorder: Secondary | ICD-10-CM

## 2021-08-12 ENCOUNTER — Inpatient Hospital Stay: Payer: Self-pay | Attending: Hematology and Oncology | Admitting: Hematology and Oncology

## 2021-08-12 ENCOUNTER — Inpatient Hospital Stay: Payer: Self-pay

## 2021-08-12 ENCOUNTER — Other Ambulatory Visit: Payer: Self-pay | Admitting: Hematology and Oncology

## 2021-08-12 NOTE — Progress Notes (Deleted)
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 Lorenso Courier   Interval History:  [name] 50 y.o. female with medical history significant for iron deficiency anemia who presents for a follow up visit. The patient's last visit was on 11/27/2020. In the interim since the last visit she received her 2nd dose of IV feraheme with another provider.  On exam today Mrs. Mcquitty orts that she feels quite well.  She notes that her cravings for ice has dissipated and that she had a nice boost in energy with the IV iron therapy.  She also notes that she has not been having any issues with shortness of breath or fatigue since she underwent the infusions.  She tolerated the infusions well without any side effects.  She reports she has been holding her iron pills as she does not wish to take them at the same time as the pain medications as this may worsen her constipation.  She notes that she is having pain in her knee is currently on steroid therapy due to a an episode of bronchitis.  On further discussion she has not yet established an OB/GYN in order to help address her menstrual cycles.  She reports that she did have her menstrual cycles this month and that it was not particularly heavy or severe.  Other than some discomfort in her knee she feels like she is recovering well.  She is doing just eat iron rich foods.  She denies any fevers, chills, sweats, nausea, vomiting or diarrhea.  A full 10 point ROS is listed below.   MEDICAL HISTORY:  Past Medical History:   Diagnosis Date   Anemia    Anxiety    Arthritis    right knee, back   COPD (chronic obstructive pulmonary disease) (HCC)    GERD (gastroesophageal reflux disease)    Headache(784.0)    History of blood transfusion    Hx; of in 1991 after delivery   History of TIAs    Numbness    Right hand   Pneumonia    Stroke ([place]) 05/2013    SURGICAL HISTORY: Past Surgical History:  Procedure Laterality Date   ANTERIOR CERVICAL DECOMP/DISCECTOMY FUSION N/A 03/06/2014   Procedure: ANTERIOR CERVICAL DECOMPRESSION/DISCECTOMY FUSION 2 LEVELS four/five, five/six;  Surgeon: Eustace Moore, MD;  Location: [facility] NEURO ORS;  Service: Neurosurgery;  Laterality: N/A;   CESAREAN SECTION     x 1 with 5th pregnancy   CHOLECYSTECTOMY     KNEE ARTHROSCOPY Right 2015   TEE WITHOUT CARDIOVERSION N/A 05/21/2013   Procedure: TRANSESOPHAGEAL ECHOCARDIOGRAM (TEE);  Surgeon: Sueanne Margarita, MD;  Location: [facility];  Service: Cardiovascular;  Laterality: N/A;   TOTAL KNEE ARTHROPLASTY Right 01/06/2021   TOTAL KNEE ARTHROPLASTY Right 01/06/2021   Procedure: RIGHT TOTAL KNEE ARTHROPLASTY;  Surgeon: Meredith Pel, MD;  Location: [facility];  Service: Orthopedics;  Laterality: Right;   TUBAL LIGATION      SOCIAL HISTORY: Social History   Socioeconomic History   Marital status: Divorced    Spouse name: Not on file   Number of children:  7   Years of education: GEd   Highest education level: Not on file  Occupational History   Occupation: CNA    Employer: [employer]  Tobacco Use   Smoking status: Former    Packs/day: 0.50    Years: 26.00    Pack years: 13.00    Types: Cigarettes    Quit date: 10/09/2015    Years since quitting: 5.8   Smokeless tobacco: Former  Scientific laboratory technician Use: Never used  Substance and Sexual Activity   Alcohol use: Not Currently    Alcohol/week: 0.0 standard drinks    Comment: occ   Drug use: No   Sexual activity: Not Currently    Birth control/protection: Surgical   Other Topics Concern   Not on file  Social History Narrative   Patient lives at home with her family    She has 7 children   5 grown and out of the house   2 at home 31 and 75   She works as a Quarry manager at [employer] Strain: Not on [employer] Insecurity: Not on file  Transportation Needs: Not on file  Physical Activity: Not on file  Stress: Not on file  Social Connections: Not on file  Intimate Partner Violence: Not on file    FAMILY HISTORY: Family History  Problem Relation Age of Onset   Renal Disease Father        dialysis   Hypertension Father    Heart disease Mother    Heart disease Maternal Grandfather    Asthma Sister    Asthma Maternal Aunt     ALLERGIES:  is allergic to shellfish allergy, amoxicillin, and latex.  MEDICATIONS:  Current Outpatient Medications  Medication Sig Dispense Refill   albuterol (PROVENTIL) (2.5 MG/3ML) 0.083% nebulizer solution Take 3 mLs by nebulization every 6 (six) hours as needed for wheezing or shortness of breath. 180 mL 1   albuterol (VENTOLIN HFA) 108 (90 Base) MCG/ACT inhaler Inhale 2 puffs into the lungs every 6 (six) hours as needed for wheezing or shortness of breath. 8 g 1   amoxicillin-clavulanate (AUGMENTIN) 875-125 MG tablet Take 1 tablet by mouth every 12 (twelve) hours. 14 tablet 0   aspirin 81 MG chewable tablet Chew 1 tablet (81 mg total) by mouth 2 (two) times daily. 60 tablet 0   benzonatate (TESSALON) 100 MG capsule Take 1 capsule (100 mg total) by mouth every 8 (eight) hours as needed for cough. 21 capsule 0   escitalopram (LEXAPRO) 10 MG tablet Take 1 tablet (10 mg total) by mouth at bedtime. 30 tablet 0   ferrous sulfate 325 (65 FE) MG tablet Take 1 tablet (325 mg total) by mouth every other day. 90 tablet 0   ferrous sulfate 325 (65 FE) MG tablet TAKE 1 TABLET (325 MG TOTAL) BY MOUTH DAILY WITH BREAKFAST. 90 tablet 0   ferumoxytol (FERAHEME) 510 MG/17ML SOLN  injection Inject 17 mLs (510 mg total) into the vein once for 1 dose. (Patient not taking: Reported on 02/05/2021) 17 mL 0   ferumoxytol (FERAHEME) 510 MG/17ML SOLN injection INJECT 17 MLS (510 MG TOTAL) INTO THE VEIN ONCE FOR 1 DOSE. 17 mL 0   fluconazole (DIFLUCAN) 150 MG tablet Take 1 tablet (150 mg total) by mouth daily. 1 tablet 0   Fluticasone-Salmeterol 232-14 MCG/ACT AEPB Inhale 1 puff into the lungs in the morning and at bedtime. 1 each 0  ipratropium (ATROVENT) 0.06 % nasal spray PLACE 2 SPRAYS INTO BOTH NOSTRILS 4 (FOUR) TIMES DAILY. 15 mL 12   omeprazole (PRILOSEC) 20 MG capsule Take 20 mg by mouth daily.     predniSONE (STERAPRED UNI-PAK 21 TAB) 10 MG (21) TBPK tablet Take by mouth daily. Take 6 tabs by mouth daily  for 2 days, then 5 tabs for 2 days, then 4 tabs for 2 days, then 3 tabs for 2 days, 2 tabs for 2 days, then 1 tab by mouth daily for 2 days 42 tablet 0   promethazine-dextromethorphan (PROMETHAZINE-DM) 6.25-15 MG/5ML syrup Take 5 mLs by mouth 4 (four) times daily as needed for cough. 140 mL 0   No current facility-administered medications for this visit.    REVIEW OF SYSTEMS:   Constitutional: ( - ) fevers, ( - )  chills , ( - ) night sweats Eyes: ( - ) blurriness of vision, ( - ) double vision, ( - ) watery eyes Ears, nose, mouth, throat, and face: ( - ) mucositis, ( - ) sore throat Respiratory: ( - ) cough, ( - ) dyspnea, ( - ) wheezes Cardiovascular: ( - ) palpitation, ( - ) chest discomfort, ( - ) lower extremity swelling Gastrointestinal:  ( - ) nausea, ( - ) heartburn, ( - ) change in bowel habits Skin: ( - ) abnormal skin rashes Lymphatics: ( - ) new lymphadenopathy, ( - ) easy bruising Neurological: ( - ) numbness, ( - ) tingling, ( - ) new weaknesses Behavioral/Psych: ( - ) mood change, ( - ) new changes  All other systems were reviewed with the patient and are negative.  PHYSICAL EXAMINATION:  There were no vitals filed for this visit.  There were no  vitals filed for this visit.   GENERAL: well appearing middle aged [place] American female. alert, no distress and comfortable SKIN: skin color, texture, turgor are normal, no rashes or significant lesions EYES: conjunctiva are pink and non-injected, sclera clear LUNGS: clear to auscultation and percussion with normal breathing effort HEART: regular rate & rhythm and no murmurs and no lower extremity edema Musculoskeletal: no cyanosis of digits and no clubbing  PSYCH: alert & oriented x 3, fluent speech NEURO: no focal motor/sensory deficits  LABORATORY DATA:  I have reviewed the data as listed CBC Latest Ref Rng & Units 02/09/2021 12/31/2020 12/15/2020  WBC 4.0 - 10.5 K/uL 6.7 7.3 5.3  Hemoglobin 12.0 - 15.0 g/dL 12.2 12.4 12.4  Hematocrit 36.0 - 46.0 % 38.9 41.0 40.5  Platelets 150 - 400 K/uL 248 236 308    CMP Latest Ref Rng & Units 02/09/2021 12/31/2020 11/27/2020  Glucose 70 - 99 mg/dL 106(H) 118(H) 107(H)  BUN 6 - 20 mg/dL 13 12 13   Creatinine 0.44 - 1.00 mg/dL 0.91 0.80 0.97  Sodium 135 - 145 mmol/L 142 142 139  Potassium 3.5 - 5.1 mmol/L 3.5 3.3(L) 4.2  Chloride 98 - 111 mmol/L 101 106 105  CO2 22 - 32 mmol/L 28 27 26   Calcium 8.9 - 10.3 mg/dL 8.4(L) 8.6(L) 8.8(L)  Total Protein 6.5 - 8.1 g/dL 7.5 - 7.6  Total Bilirubin 0.3 - 1.2 mg/dL 0.5 - 0.4  Alkaline Phos 38 - 126 U/L 104 - 75  AST 15 - 41 U/L 14(L) - 15  ALT 0 - 44 U/L 14 - 8    RADIOGRAPHIC STUDIES: No results found.   ASSESSMENT & PLAN [name] 50 y.o. female with medical history significant for iron deficiency  anemia who presents for a follow up visit.  After review the labs, the records, and discussion with the patient the findings are most consistent with iron deficiency anemia secondary to GYN bleeding.  The patient has responded excellently to the IV iron therapy.  Her hemoglobin has returned to a nice robust level greater than 12.0.  She is not having any issues with symptoms.  Given these findings we  will have the patient return in approximately 6 months time in order to assure that she is maintaining these high levels of iron and that she is established with an OB/GYN practice.  # Iron Deficiency Anemia 2/2 to GYN Bleeding -- Findings are most consistent with chronic anemia secondary to GYN bleeding. -- Patient has tried iron in the past and found that she was unable to tolerate it due to severe constipation.  She did respond to her most recent IV iron therapy (Jan 2022) with a robust return of Hgb to >12.0.  -- completed IV Feraheme 510 mg q. 7 days x 2 doses on 12/01/2020. -- will re-evaluate the patients CBC and iron stores today --Encourage patient to establish with an OB/GYN practice to better control her heavy menstrual cycles. --RTC in 6 months or sooner if more IV iron is needed.  #Hypertension --Elevated above baseline today, likely secondary to recent steroid therapy as well as pain from the surgery. --Continue to follow with her primary care provider regarding this hypertension. --We will monitor at subsequent visits.  No orders of the defined types were placed in this encounter.   All questions were answered. The patient knows to call the clinic with any problems, questions or concerns.  A total of more than 30 minutes were spent on this encounter and over half of that time was spent on counseling and coordination of care as outlined above.   Ledell Peoples, MD Department of Hematology/Oncology [place] [facility] Phone: [phone] Pager: [phone] Email: [email or web address]  08/12/2021 1:50 PM

## 2021-08-13 MED ORDER — ESCITALOPRAM OXALATE 10 MG PO TABS
10.0000 mg | ORAL_TABLET | Freq: Every day | ORAL | 0 refills | Status: DC
  Filled 2021-08-13: qty 30, 30d supply, fill #0

## 2021-08-13 NOTE — Telephone Encounter (Signed)
Escitalopram (Lexapro) refilled for 30 day courtesy refill. Last appointment for anxiety and depression on 05/19/2021. Please schedule appointment for additional refills.

## 2021-08-14 ENCOUNTER — Other Ambulatory Visit: Payer: Self-pay

## 2021-08-23 ENCOUNTER — Telehealth: Payer: Self-pay | Admitting: Nurse Practitioner

## 2021-08-23 ENCOUNTER — Encounter: Payer: Self-pay | Admitting: Nurse Practitioner

## 2021-08-23 DIAGNOSIS — M545 Low back pain, unspecified: Secondary | ICD-10-CM

## 2021-08-23 MED ORDER — NAPROXEN SODIUM 550 MG PO TABS: 550.0000 mg | ORAL_TABLET | Freq: Two times a day (BID) | ORAL | 0 refills | Status: AC

## 2021-08-23 MED ORDER — CYCLOBENZAPRINE HCL 10 MG PO TABS: 10.0000 mg | ORAL_TABLET | Freq: Three times a day (TID) | ORAL | 0 refills | Status: AC | PRN

## 2021-08-23 NOTE — Progress Notes (Signed)
I have spent 5 minutes in review of e-visit questionnaire, review and updating patient chart, medical decision making and response to patient.  ° °Edan Serratore W Eliah Ozawa, NP ° °  °

## 2021-08-23 NOTE — Progress Notes (Signed)

## 2021-09-18 ENCOUNTER — Institutional Professional Consult (permissible substitution): Payer: Self-pay | Admitting: Pulmonary Disease

## 2021-09-18 NOTE — Progress Notes (Deleted)
Synopsis: Referred in November 2022 for COPD by Durene Fruits, NP  Subjective:   PATIENT ID: [name] GENDER: female DOB: Oct 31, 1971, MRN: [id number]   HPI  No chief complaint on file.   [name] is a 50 year old woman, former smoker with history of CVA, GERD and anemia who is referred to pulmonary clinic for COPD evaluation.     Past Medical History:  Diagnosis Date   Anemia    Anxiety    Arthritis    right knee, back   COPD (chronic obstructive pulmonary disease) (HCC)    GERD (gastroesophageal reflux disease)    Headache(784.0)    History of blood transfusion    Hx; of in 1991 after delivery   History of TIAs    Numbness    Right hand   Pneumonia    Stroke ([place]) 05/2013     Family History  Problem Relation Age of Onset   Renal Disease Father        dialysis   Hypertension Father    Heart disease Mother    Heart disease Maternal Grandfather    Asthma Sister    Asthma Maternal Aunt      Social History   Socioeconomic History   Marital status: Divorced    Spouse name: Not on file   Number of children: 7   Years of education: GEd   Highest education level: Not on file  Occupational History   Occupation: CNA    Employer: [employer]  Tobacco Use   Smoking status: Former    Packs/day: 0.50    Years: 26.00    Pack years: 13.00    Types: Cigarettes    Quit date: 10/09/2015    Years since quitting: 5.9   Smokeless tobacco: Former  Scientific laboratory technician Use: Never used  Substance and Sexual Activity   Alcohol use: Not Currently    Alcohol/week: 0.0 standard drinks    Comment: occ   Drug use: No   Sexual activity: Not Currently    Birth control/protection: Surgical  Other Topics Concern   Not on file  Social History Narrative   Patient lives at home with her family    She has 7 children   5 grown and out of the house   2 at home 52 and 43   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 180 mL 1   albuterol (VENTOLIN HFA) 108 (90 Base) MCG/ACT inhaler Inhale 2 puffs into the lungs every 6 (six) hours as needed for wheezing or shortness of breath. 8 g 1   aspirin 81 MG chewable tablet Chew 1 tablet (81 mg total) by mouth 2 (two) times daily. 60 tablet 0   benzonatate (TESSALON) 100 MG capsule Take 1  capsule (100 mg total) by mouth every 8 (eight) hours as needed for cough. 21 capsule 0   escitalopram (LEXAPRO) 10 MG tablet Take 1 tablet (10 mg total) by mouth at bedtime. 30 tablet 0   ferrous sulfate 325 (65 FE) MG tablet Take 1 tablet (325 mg total) by mouth every other day. 90 tablet 0   ferrous sulfate 325 (65 FE) MG tablet TAKE 1 TABLET (325 MG TOTAL) BY MOUTH DAILY WITH BREAKFAST. 90 tablet 0   ferumoxytol (FERAHEME) 510 MG/17ML SOLN injection Inject 17 mLs (510 mg total) into the vein once for 1 dose. (Patient not taking: Reported on 02/05/2021) 17 mL 0   ferumoxytol (FERAHEME) 510 MG/17ML SOLN injection INJECT 17 MLS (510 MG TOTAL) INTO THE VEIN ONCE FOR 1 DOSE. 17 mL 0   fluconazole (DIFLUCAN) 150 MG tablet Take 1 tablet (150 mg total) by mouth daily. 1 tablet 0   Fluticasone-Salmeterol 232-14 MCG/ACT AEPB Inhale 1 puff into the lungs in the morning and at bedtime. 1 each 0   ipratropium (ATROVENT) 0.06 % nasal spray PLACE 2 SPRAYS INTO BOTH NOSTRILS 4 (FOUR) TIMES DAILY. 15 mL  12   omeprazole (PRILOSEC) 20 MG capsule Take 20 mg by mouth daily.     predniSONE (STERAPRED UNI-PAK 21 TAB) 10 MG (21) TBPK tablet Take by mouth daily. Take 6 tabs by mouth daily  for 2 days, then 5 tabs for 2 days, then 4 tabs for 2 days, then 3 tabs for 2 days, 2 tabs for 2 days, then 1 tab by mouth daily for 2 days 42 tablet 0   promethazine-dextromethorphan (PROMETHAZINE-DM) 6.25-15 MG/5ML syrup Take 5 mLs by mouth 4 (four) times daily as needed for cough. 140 mL 0   No facility-administered medications prior to visit.    Review of Systems  Constitutional:  Negative for chills, fever, malaise/fatigue and weight loss.  HENT:  Negative for congestion, sinus pain and sore throat.   Eyes: Negative.   Respiratory:  Negative for cough, hemoptysis, sputum production, shortness of breath and wheezing.   Cardiovascular:  Negative for chest pain, palpitations, orthopnea, claudication and leg swelling.  Gastrointestinal:  Negative for abdominal pain, heartburn, nausea and vomiting.  Genitourinary: Negative.   Musculoskeletal:  Negative for joint pain and myalgias.  Skin:  Negative for rash.  Neurological:  Negative for weakness.  Endo/Heme/Allergies: Negative.   Psychiatric/Behavioral: Negative.     Objective:  There were no vitals filed for this visit.   Physical Exam Constitutional:      General: She is not in acute distress.    Appearance: She is not ill-appearing.  HENT:     Head: Normocephalic and atraumatic.  Eyes:     General: No scleral icterus.    Conjunctiva/sclera: Conjunctivae normal.     Pupils: Pupils are equal, round, and reactive to light.  Cardiovascular:     Rate and Rhythm: Normal rate and regular rhythm.     Pulses: Normal pulses.     Heart sounds: Normal heart sounds. No murmur heard. Pulmonary:     Effort: Pulmonary effort is normal.     Breath sounds: Normal breath sounds. No wheezing, rhonchi or rales.  Abdominal:     General: Bowel sounds are normal.      Palpations: Abdomen is soft.  Musculoskeletal:     Right lower leg: No edema.     Left lower leg: No edema.  Lymphadenopathy:     Cervical: No cervical adenopathy.  Skin:  General: Skin is warm and dry.  Neurological:     General: No focal deficit present.     Mental Status: She is alert.  Psychiatric:        Mood and Affect: Mood normal.        Behavior: Behavior normal.        Thought Content: Thought content normal.        Judgment: Judgment normal.    CBC    Component Value Date/Time   WBC 6.7 02/09/2021 1515   WBC 7.3 12/31/2020 0836   RBC 4.78 02/09/2021 1515   RBC 4.84 02/09/2021 1514   HGB 12.2 02/09/2021 1515   HGB 12.4 12/15/2020 1137   HCT 38.9 02/09/2021 1515   HCT 40.5 12/15/2020 1137   PLT 248 02/09/2021 1515   PLT 308 12/15/2020 1137   MCV 81.4 02/09/2021 1515   MCV 75 (L) 12/15/2020 1137   MCH 25.5 (L) 02/09/2021 1515   MCHC 31.4 02/09/2021 1515   RDW 20.0 (H) 02/09/2021 1515   RDW 21.7 (H) 11/17/2020 1426   LYMPHSABS 1.6 02/09/2021 1515   LYMPHSABS 2.4 12/15/2020 1137   MONOABS 0.4 02/09/2021 1515   EOSABS 0.0 02/09/2021 1515   EOSABS 0.0 12/15/2020 1137   BASOSABS 0.0 02/09/2021 1515   BASOSABS 0.0 12/15/2020 1137   BMP Latest Ref Rng & Units 02/09/2021 12/31/2020 11/27/2020  Glucose 70 - 99 mg/dL 106(H) 118(H) 107(H)  BUN 6 - 20 mg/dL 13 12 13   Creatinine 0.44 - 1.00 mg/dL 0.91 0.80 0.97  BUN/Creat Ratio 9 - 23 - - -  Sodium 135 - 145 mmol/L 142 142 139  Potassium 3.5 - 5.1 mmol/L 3.5 3.3(L) 4.2  Chloride 98 - 111 mmol/L 101 106 105  CO2 22 - 32 mmol/L 28 27 26   Calcium 8.9 - 10.3 mg/dL 8.4(L) 8.6(L) 8.8(L)   Chest imaging: CXR 03/03/21 The heart size and mediastinal contours are within normal limits. There is minimal left infrahilar atelectasis/airspace disease. The right lung is clear. There is no pleural effusion or pneumothorax. Fixation hardware overlies the cervical spine. Degenerative changes are seen in the spine.  PFT: No  flowsheet data found.  Labs:  Path:  Echo 08/24/2013: LV EF 55-60%. PASP 2mmHg. LA is normal in size. RV function and size is normal. RA is normal size.   Heart Catheterization:  Assessment & Plan:   No diagnosis found.  Discussion: ***    Current Outpatient Medications:    albuterol (PROVENTIL) (2.5 MG/3ML) 0.083% nebulizer solution, Take 3 mLs by nebulization every 6 (six) hours as needed for wheezing or shortness of breath., Disp: 180 mL, Rfl: 1   albuterol (VENTOLIN HFA) 108 (90 Base) MCG/ACT inhaler, Inhale 2 puffs into the lungs every 6 (six) hours as needed for wheezing or shortness of breath., Disp: 8 g, Rfl: 1   aspirin 81 MG chewable tablet, Chew 1 tablet (81 mg total) by mouth 2 (two) times daily., Disp: 60 tablet, Rfl: 0   benzonatate (TESSALON) 100 MG capsule, Take 1 capsule (100 mg total) by mouth every 8 (eight) hours as needed for cough., Disp: 21 capsule, Rfl: 0   escitalopram (LEXAPRO) 10 MG tablet, Take 1 tablet (10 mg total) by mouth at bedtime., Disp: 30 tablet, Rfl: 0   ferrous sulfate 325 (65 FE) MG tablet, Take 1 tablet (325 mg total) by mouth every other day., Disp: 90 tablet, Rfl: 0   ferrous sulfate 325 (65 FE) MG tablet, TAKE 1 TABLET (325 MG TOTAL) BY  MOUTH DAILY WITH BREAKFAST., Disp: 90 tablet, Rfl: 0   ferumoxytol (FERAHEME) 510 MG/17ML SOLN injection, Inject 17 mLs (510 mg total) into the vein once for 1 dose. (Patient not taking: Reported on 02/05/2021), Disp: 17 mL, Rfl: 0   ferumoxytol (FERAHEME) 510 MG/17ML SOLN injection, INJECT 17 MLS (510 MG TOTAL) INTO THE VEIN ONCE FOR 1 DOSE., Disp: 17 mL, Rfl: 0   fluconazole (DIFLUCAN) 150 MG tablet, Take 1 tablet (150 mg total) by mouth daily., Disp: 1 tablet, Rfl: 0   Fluticasone-Salmeterol 232-14 MCG/ACT AEPB, Inhale 1 puff into the lungs in the morning and at bedtime., Disp: 1 each, Rfl: 0   ipratropium (ATROVENT) 0.06 % nasal spray, PLACE 2 SPRAYS INTO BOTH NOSTRILS 4 (FOUR) TIMES DAILY., Disp: 15 mL,  Rfl: 12   omeprazole (PRILOSEC) 20 MG capsule, Take 20 mg by mouth daily., Disp: , Rfl:    predniSONE (STERAPRED UNI-PAK 21 TAB) 10 MG (21) TBPK tablet, Take by mouth daily. Take 6 tabs by mouth daily  for 2 days, then 5 tabs for 2 days, then 4 tabs for 2 days, then 3 tabs for 2 days, 2 tabs for 2 days, then 1 tab by mouth daily for 2 days, Disp: 42 tablet, Rfl: 0   promethazine-dextromethorphan (PROMETHAZINE-DM) 6.25-15 MG/5ML syrup, Take 5 mLs by mouth 4 (four) times daily as needed for cough., Disp: 140 mL, Rfl: 0

## 2021-09-22 ENCOUNTER — Other Ambulatory Visit: Payer: Self-pay

## 2021-09-22 ENCOUNTER — Encounter: Payer: Self-pay | Admitting: Pulmonary Disease

## 2021-09-22 ENCOUNTER — Telehealth: Payer: Self-pay | Admitting: Pulmonary Disease

## 2021-09-22 ENCOUNTER — Ambulatory Visit (INDEPENDENT_AMBULATORY_CARE_PROVIDER_SITE_OTHER): Payer: Self-pay | Admitting: Pulmonary Disease

## 2021-09-22 DIAGNOSIS — J449 Chronic obstructive pulmonary disease, unspecified: Secondary | ICD-10-CM

## 2021-09-22 DIAGNOSIS — J441 Chronic obstructive pulmonary disease with (acute) exacerbation: Secondary | ICD-10-CM

## 2021-09-22 MED ORDER — FLUTICASONE-SALMETEROL 232-14 MCG/ACT IN AEPB: 1.0000 | INHALATION_SPRAY | Freq: Two times a day (BID) | RESPIRATORY_TRACT | 5 refills | Status: DC

## 2021-09-22 MED ORDER — PREDNISONE 10 MG PO TABS: ORAL_TABLET | ORAL | 0 refills | Status: AC

## 2021-09-22 MED ORDER — ALBUTEROL SULFATE HFA 108 (90 BASE) MCG/ACT IN AERS: 2.0000 | INHALATION_SPRAY | Freq: Four times a day (QID) | RESPIRATORY_TRACT | 5 refills | Status: DC | PRN

## 2021-09-22 MED ORDER — AZITHROMYCIN 250 MG PO TABS: ORAL_TABLET | ORAL | 0 refills | Status: DC

## 2021-09-22 MED ORDER — AZITHROMYCIN 250 MG PO TABS: 250.0000 mg | ORAL_TABLET | Freq: Every day | ORAL | 0 refills | Status: DC

## 2021-09-22 MED ORDER — FLUCONAZOLE 150 MG PO TABS: 150.0000 mg | ORAL_TABLET | Freq: Every day | ORAL | 0 refills | Status: AC

## 2021-09-22 NOTE — Telephone Encounter (Signed)
Sledge for patient to take total #6 pill. Patient verbally was instructed to take two pills on day one followed by one pill daily for four day.

## 2021-09-22 NOTE — Progress Notes (Signed)
Subjective:   PATIENT ID: [name] GENDER: female DOB: 06-08-1971, MRN: [id number]   HPI  Chief Complaint  Patient presents with   Consult    Possible copd    Reason for Visit: New consult for COPD  [name] is a 50 year old female former smoker with hx TIA and chronic headaches who presents as a new consult for COPD.  She reports long standing of respiratory issues including frequent pneumonias as a child and once during her pregnancy at age 54. She has had worsening unproductive cough in the last two years with 3-5 urgent care/ED visits in that timeframe. She reports upper airway congestion and wheezing. Sometimes has post-tussive emesis. Cough occurs all day but seems to occur more at night.  ED note from 06/30/21 by NP Vickki Muff reviewed. She presented with productive cough, nasal congestion and wheezing x 1 week. Treated with Augmentin, prednisone and benzonatate. Advised to be seen by Pulmonary for COPD exacerbation follow-up. She has been on AirDuo Respiclick that she is intermittently is compliant. After she finished her steroids, she is having recurrent wheezing and cough. 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  I have personally reviewed patient's past medical/family/social history, allergies, current medications.  Past Medical History:  Diagnosis Date   Anemia    Anxiety    Arthritis    right knee, back   COPD (chronic obstructive pulmonary disease) (HCC)    GERD (gastroesophageal reflux disease)    Headache(784.0)    History of blood transfusion    Hx; of in 1991 after delivery   History of TIAs    Numbness    Right hand   Pneumonia    Stroke ([place]) 05/2013     Family History  Problem Relation Age of Onset   Renal Disease Father        dialysis   Hypertension Father    Heart disease  Mother    Heart disease Maternal Grandfather    Asthma Sister    Asthma Maternal Aunt      Social History   Occupational History   Occupation: Forensic psychologist: [employer]  Tobacco Use   Smoking status: Former    Packs/day: 0.50    Years: 26.00    Pack years: 13.00    Types: Cigarettes    Quit date: 10/09/2015    Years since quitting: 5.9   Smokeless tobacco: Former  Scientific laboratory technician Use: Never used  Substance and Sexual Activity   Alcohol use: Not Currently    Alcohol/week: 0.0 standard drinks    Comment: occ   Drug use: No   Sexual activity: Not Currently    Birth control/protection: Surgical    Allergies  Allergen Reactions   Shellfish Allergy Itching and Swelling   Amoxicillin Other (See Comments)    Yeast infection   Latex Itching and Rash     Outpatient Medications Prior to Visit  Medication Sig Dispense Refill   albuterol (PROVENTIL) (2.5 MG/3ML) 0.083% nebulizer solution Take 3 mLs by nebulization every 6 (six) hours as needed for wheezing or shortness of breath. 180 mL 1   albuterol (VENTOLIN HFA) 108 (90 Base) MCG/ACT inhaler Inhale 2 puffs into the lungs every 6 (six) hours as needed for wheezing or shortness of breath. 8 g  1   escitalopram (LEXAPRO) 10 MG tablet Take 1 tablet (10 mg total) by mouth at bedtime. 30 tablet 0   ferrous sulfate 325 (65 FE) MG tablet Take 1 tablet (325 mg total) by mouth every other day. 90 tablet 0   Fluticasone-Salmeterol 232-14 MCG/ACT AEPB Inhale 1 puff into the lungs in the morning and at bedtime. 1 each 0   omeprazole (PRILOSEC) 20 MG capsule Take 20 mg by mouth daily.     aspirin 81 MG chewable tablet Chew 1 tablet (81 mg total) by mouth 2 (two) times daily. (Patient not taking: Reported on 09/22/2021) 60 tablet 0   benzonatate (TESSALON) 100 MG capsule Take 1 capsule (100 mg total) by mouth every 8 (eight) hours as needed for cough. (Patient not taking: Reported on 09/22/2021) 21 capsule 0   ferrous sulfate 325 (65  FE) MG tablet TAKE 1 TABLET (325 MG TOTAL) BY MOUTH DAILY WITH BREAKFAST. (Patient not taking: Reported on 09/22/2021) 90 tablet 0   ferumoxytol (FERAHEME) 510 MG/17ML SOLN injection Inject 17 mLs (510 mg total) into the vein once for 1 dose. (Patient not taking: Reported on 02/05/2021) 17 mL 0   ferumoxytol (FERAHEME) 510 MG/17ML SOLN injection INJECT 17 MLS (510 MG TOTAL) INTO THE VEIN ONCE FOR 1 DOSE. (Patient not taking: Reported on 09/22/2021) 17 mL 0   fluconazole (DIFLUCAN) 150 MG tablet Take 1 tablet (150 mg total) by mouth daily. (Patient not taking: Reported on 09/22/2021) 1 tablet 0   ipratropium (ATROVENT) 0.06 % nasal spray PLACE 2 SPRAYS INTO BOTH NOSTRILS 4 (FOUR) TIMES DAILY. (Patient not taking: Reported on 09/22/2021) 15 mL 12   promethazine-dextromethorphan (PROMETHAZINE-DM) 6.25-15 MG/5ML syrup Take 5 mLs by mouth 4 (four) times daily as needed for cough. (Patient not taking: Reported on 09/22/2021) 140 mL 0   predniSONE (STERAPRED UNI-PAK 21 TAB) 10 MG (21) TBPK tablet Take by mouth daily. Take 6 tabs by mouth daily  for 2 days, then 5 tabs for 2 days, then 4 tabs for 2 days, then 3 tabs for 2 days, 2 tabs for 2 days, then 1 tab by mouth daily for 2 days (Patient not taking: Reported on 09/22/2021) 42 tablet 0   No facility-administered medications prior to visit.    Review of Systems  Constitutional:  Negative for chills, diaphoresis, fever, malaise/fatigue and weight loss.  HENT:  Positive for ear pain and sore throat. Negative for congestion.   Respiratory:  Positive for cough and sputum production. Negative for hemoptysis, shortness of breath and wheezing.   Cardiovascular:  Negative for chest pain, palpitations and leg swelling.  Gastrointestinal:  Positive for heartburn. Negative for abdominal pain and nausea.  Genitourinary:  Negative for frequency.  Musculoskeletal:  Negative for joint pain and myalgias.  Skin:  Negative for itching and rash.  Neurological:  Negative  for dizziness, weakness and headaches.  Endo/Heme/Allergies:  Does not bruise/bleed easily.  Psychiatric/Behavioral:  Negative for depression. The patient is nervous/anxious.     Objective:   Vitals:   09/22/21 1117  BP: 130/86  Pulse: 84  Temp: 98.7 F (37.1 C)  TempSrc: Oral  SpO2: 98%  Weight: 213 lb 3.2 oz (96.7 kg)  Height: 5' 3.5" (1.613 m)   SpO2: 98 % O2 Device: None (Room air)  Physical Exam: General: Well-appearing, no acute distress HENT: [place], AT Eyes: EOMI, no scleral icterus Respiratory: Clear to auscultation bilaterally.  No crackles, wheezing or rales Cardiovascular: RRR, -M/R/G, no JVD Extremities:-Edema,-tenderness Neuro: AAO x4, CNII-XII  grossly intact Psych: Normal mood, normal affect  Data Reviewed:  Imaging: CTA 11/01/15 - No pulmonary embolism. Bilateral patchy opacities. Right hilar node measuring 1.4 cm CXR 09/22/21 - Left infrahilar atelectasis. Cervical hardware  PFT: None on file  Labs: CBC    Component Value Date/Time   WBC 6.7 02/09/2021 1515   WBC 7.3 12/31/2020 0836   RBC 4.78 02/09/2021 1515   RBC 4.84 02/09/2021 1514   HGB 12.2 02/09/2021 1515   HGB 12.4 12/15/2020 1137   HCT 38.9 02/09/2021 1515   HCT 40.5 12/15/2020 1137   PLT 248 02/09/2021 1515   PLT 308 12/15/2020 1137   MCV 81.4 02/09/2021 1515   MCV 75 (L) 12/15/2020 1137   MCH 25.5 (L) 02/09/2021 1515   MCHC 31.4 02/09/2021 1515   RDW 20.0 (H) 02/09/2021 1515   RDW 21.7 (H) 11/17/2020 1426   LYMPHSABS 1.6 02/09/2021 1515   LYMPHSABS 2.4 12/15/2020 1137   MONOABS 0.4 02/09/2021 1515   EOSABS 0.0 02/09/2021 1515   EOSABS 0.0 12/15/2020 1137   BASOSABS 0.0 02/09/2021 1515   BASOSABS 0.0 12/15/2020 1137   Absolute eos 02/09/21 - 0     Assessment & Plan:   Discussion: 50 year old female former smoker with hx TIA and chronic headaches who presents as a new consult for COPD. Uncontrolled symptoms. Currently in exacerbation. Prior chest imaging in 2016 with right  hilar enlargement, will obtain up to date CT to review.  Chronic bronchitis COPD exacerbation --ARRANGE pulmonary function tests --CONTINUE AirDuo 232-14 mcg ONE puff TWICE a day --CONTINUE Albuterol every 4 hours AS NEEDED --CONTINUE Albuterol nebulizer q6h AS NEEDED --START prednisone taper as directed. Provided diflucan if needed --START azithromycin x 5 days  Chronic cough --Increase omeprazole 20 mg twice a day  Right hilar enlargement --ORDER CT Chest without contrast in January before appointment  Health Maintenance Immunization History  Administered Date(s) Administered   Influenza,inj,Quad PF,6+ Mos 09/04/2015, 11/11/2016   Influenza-Unspecified 08/08/2014, 08/22/2021   Moderna Sars-Covid-2 Vaccination 11/10/2019, 12/08/2019, 09/16/2020   Pneumococcal Polysaccharide-23 05/21/2013   Tdap 12/15/2020   CT Lung Screen - not indicated  Orders Placed This Encounter  Procedures   CT Chest Wo Contrast    CT schedule before ov in Jan 2023    Standing Status:   Future    Standing Expiration Date:   09/22/2022    Order Specific Question:   Is patient pregnant?    Answer:   No    Order Specific Question:   Preferred imaging location?    Answer:   [facility]   Pulmonary function test    Standing Status:   Future    Standing Expiration Date:   09/22/2022    Order Specific Question:   Where should this test be performed?    Answer:   [facility]   Meds ordered this encounter  Medications   Fluticasone-Salmeterol 232-14 MCG/ACT AEPB    Sig: Inhale 1 puff into the lungs in the morning and at bedtime.    Dispense:  1 each    Refill:  5   albuterol (VENTOLIN HFA) 108 (90 Base) MCG/ACT inhaler    Sig: Inhale 2 puffs into the lungs every 6 (six) hours as needed for wheezing or shortness of breath.    Dispense:  8 g    Refill:  5   predniSONE (DELTASONE) 10 MG tablet    Sig: Take 4 tablets (40 mg total) by mouth daily with breakfast for 2 days,  THEN 3 tablets  (30 mg total) daily with breakfast for 2 days, THEN 2 tablets (20 mg total) daily with breakfast for 2 days, THEN 1 tablet (10 mg total) daily with breakfast for 2 days.    Dispense:  20 tablet    Refill:  0   azithromycin (ZITHROMAX) 250 MG tablet    Sig: Take 1 tablet (250 mg total) by mouth daily.    Dispense:  6 tablet    Refill:  0   fluconazole (DIFLUCAN) 150 MG tablet    Sig: Take 1 tablet (150 mg total) by mouth daily for 1 day.    Dispense:  1 tablet    Refill:  0   Return in about 2 months (around 11/22/2021).  I have spent a total time of 45-minutes on the day of the appointment reviewing prior documentation, coordinating care and discussing medical diagnosis and plan with the patient/family. Imaging, labs and tests included in this note have been reviewed and interpreted independently by me.  [place], MD [facility] 09/22/2021 11:44 AM  Office Number [phone]

## 2021-09-22 NOTE — Telephone Encounter (Signed)
Azithromycin 250mg  sent to pharmacy for #6 with directions to take one daily. Per Dr. Cordelia Pen notes it was instructed to take azithromycin for 5 days. New script sent to pharmacy for zpak instructions. ATC pharmacy to cancel other script but they were closed until 2pm. Will call back to cancel other azithromycin script.   Will forward to Dr. Loanne Drilling as Juluis Rainier.

## 2021-09-22 NOTE — Patient Instructions (Addendum)
Chronic bronchitis COPD exacerbation --ARRANGE pulmonary function tests --CONTINUE AirDuo 232-14 mcg ONE puff TWICE a day --CONTINUE Albuterol every 4 hours AS NEEDED --CONTINUE Albuterol nebulizer q6h AS NEEDED --START prednisone taper as directed. Provided diflucan if needed --START azithromycin x 5 days  Chronic cough --Increase omeprazole 20 mg twice a day  Right hilar enlargement --ORDER CT Chest without contrast in January before appointment  Follow-up me in January 2023

## 2021-09-24 ENCOUNTER — Other Ambulatory Visit (HOSPITAL_COMMUNITY): Payer: Self-pay

## 2021-09-24 ENCOUNTER — Telehealth: Payer: Self-pay

## 2021-09-24 ENCOUNTER — Encounter: Payer: Self-pay | Admitting: Pulmonary Disease

## 2021-09-24 NOTE — Telephone Encounter (Signed)
Patient Advocate Encounter  Prior Authorization for Ventolin HFA inhaler has been approved.    PA# [id number] Effective dates: 09/24/21 through 09/24/22  Per Test Claim Patients co-pay is $0.   Spoke with Pharmacy to Process.  Patient Advocate Fax:  [phone]

## 2021-09-24 NOTE — Telephone Encounter (Signed)
Patient Advocate Encounter   Received notification from [facility] that prior authorization for Fluticasone-Salmeterol is required by his/her insurance.   PA submitted on 09/24/21 Key [id number]  Status is pending    [facility] will continue to follow:  Patient Advocate Fax:  [phone]

## 2021-09-25 ENCOUNTER — Other Ambulatory Visit (HOSPITAL_COMMUNITY): Payer: Self-pay

## 2021-09-30 ENCOUNTER — Other Ambulatory Visit (HOSPITAL_COMMUNITY): Payer: Self-pay

## 2021-09-30 NOTE — Telephone Encounter (Signed)
Called [employer] to f/u on PA for Advair.  No decision has been made yet.  Still in review.

## 2021-10-06 ENCOUNTER — Other Ambulatory Visit (HOSPITAL_COMMUNITY): Payer: Self-pay

## 2021-10-06 NOTE — Telephone Encounter (Signed)
Noted please let us know any updates from the pharmacy or if we need to proceed with a different inhaler for the patient.

## 2021-10-06 NOTE — Telephone Encounter (Signed)
Patient Advocate Encounter  Received notification from [facility] that the request for prior authorization for generic Airduo has been denied due to not trying and failing at least 2 of the following: Advair, Breo, Wixela, symbicort or a generic fluticasone/salmeterol.   However, per test claim, Brand name goes through for a $20 copay with an e-voucher. (I'm not sure if the pt's pharmacy process it the same or not)  Generic Advair is $0 Brand Symbicort is $0 with DAW 9 (ins prefers brand)  Called [facility] to see if they can process the brand Airduo. She will call me back if it doesn't go through. She said they had to redrop the rx.

## 2021-10-26 ENCOUNTER — Telehealth: Payer: Self-pay | Admitting: Pulmonary Disease

## 2021-10-26 ENCOUNTER — Encounter: Payer: Self-pay | Admitting: Pulmonary Disease

## 2021-10-26 DIAGNOSIS — J441 Chronic obstructive pulmonary disease with (acute) exacerbation: Secondary | ICD-10-CM

## 2021-10-26 MED ORDER — FLUCONAZOLE 150 MG PO TABS: 150.0000 mg | ORAL_TABLET | Freq: Every day | ORAL | 0 refills | Status: DC

## 2021-10-26 MED ORDER — PREDNISONE 10 MG PO TABS: ORAL_TABLET | ORAL | 0 refills | Status: AC

## 2021-10-26 MED ORDER — AZITHROMYCIN 250 MG PO TABS: ORAL_TABLET | ORAL | 0 refills | Status: DC

## 2021-10-26 NOTE — Telephone Encounter (Signed)
See phone note from 10/26/21.

## 2021-10-26 NOTE — Telephone Encounter (Signed)
Please call patient regarding meds ordered.  Treatment for COPD exacerbation ordered. --Prednisone taper, azithromycin --Ppx fluconazole ordered for yeast infection

## 2021-10-26 NOTE — Telephone Encounter (Signed)
I called the patient and she reports that she is having a cough that has clear sputum and is thick. She denies any body aches, chills, fever, she has some shortness of breath when coughing and occasional tightness in her chest.  She has not had anything recently. Please advise.

## 2021-10-26 NOTE — Telephone Encounter (Signed)
Spoke with pt and reviewed Dr. Loanne Drilling recommendations along with medication instructions. Pt verified pharmacy and stated understanding. Nothing further needed at this time.

## 2021-11-11 ENCOUNTER — Ambulatory Visit (INDEPENDENT_AMBULATORY_CARE_PROVIDER_SITE_OTHER)
Admission: RE | Admit: 2021-11-11 | Discharge: 2021-11-11 | Disposition: A | Discharge status: Home / Self Care | Payer: Self-pay | Source: Ambulatory Visit | Attending: Pulmonary Disease | Admitting: Pulmonary Disease

## 2021-11-11 ENCOUNTER — Other Ambulatory Visit (HOSPITAL_COMMUNITY): Payer: Self-pay

## 2021-11-11 ENCOUNTER — Other Ambulatory Visit: Payer: Self-pay

## 2021-11-11 DIAGNOSIS — J441 Chronic obstructive pulmonary disease with (acute) exacerbation: Secondary | ICD-10-CM

## 2021-11-11 MED FILL — Ipratropium Bromide Nasal Soln 0.06% (42 MCG/SPRAY): NASAL | 30 days supply | Qty: 15 | Fill #0 | Status: CN

## 2021-11-14 ENCOUNTER — Other Ambulatory Visit (HOSPITAL_COMMUNITY): Payer: Self-pay

## 2021-11-20 ENCOUNTER — Other Ambulatory Visit (HOSPITAL_COMMUNITY): Payer: Self-pay

## 2021-11-20 ENCOUNTER — Telehealth: Payer: Self-pay

## 2021-11-20 NOTE — Telephone Encounter (Signed)
Patient called stating that her right knee was swollen this morning on the inside and aching.  Stated that it hurts a little to walk.  Wanted to know was this normal?  Had right TKA on 01/06/2021.  Cb# [id number].  Please advise.

## 2021-11-20 NOTE — Telephone Encounter (Signed)
I called.  Little bit of soreness on the medial side of the knee.  No significant swelling.  No fevers, chills, change in appearance incision, other concerning symptoms.  Only been going on for about a day.  She will watch it and call the office back if she notices continued symptoms or they become significantly worse.  She is able to ambulate without any problem.

## 2021-11-23 ENCOUNTER — Other Ambulatory Visit (HOSPITAL_COMMUNITY): Payer: Self-pay

## 2021-11-25 ENCOUNTER — Encounter: Payer: Self-pay | Admitting: Pulmonary Disease

## 2021-11-25 ENCOUNTER — Other Ambulatory Visit: Payer: Self-pay | Admitting: Pulmonary Disease

## 2021-11-25 ENCOUNTER — Other Ambulatory Visit: Payer: Self-pay

## 2021-11-25 ENCOUNTER — Ambulatory Visit (INDEPENDENT_AMBULATORY_CARE_PROVIDER_SITE_OTHER): Payer: Self-pay | Admitting: Pulmonary Disease

## 2021-11-25 VITALS — BP 128/60 | HR 90 | Temp 97.6°F | Ht 68.0 in | Wt 219.2 lb

## 2021-11-25 DIAGNOSIS — J441 Chronic obstructive pulmonary disease with (acute) exacerbation: Secondary | ICD-10-CM

## 2021-11-25 DIAGNOSIS — E041 Nontoxic single thyroid nodule: Secondary | ICD-10-CM

## 2021-11-25 DIAGNOSIS — J449 Chronic obstructive pulmonary disease, unspecified: Secondary | ICD-10-CM

## 2021-11-25 DIAGNOSIS — J42 Unspecified chronic bronchitis: Secondary | ICD-10-CM

## 2021-11-25 LAB — PULMONARY FUNCTION TEST
DL/VA % pred: 113 %
DL/VA: 4.87 ml/min/mmHg/L
DLCO cor % pred: 73 %
DLCO cor: 15.43 ml/min/mmHg
DLCO unc % pred: 73 %
DLCO unc: 15.43 ml/min/mmHg
FEF 25-75 Post: 1.93 L/sec
FEF 25-75 Pre: 2.15 L/sec
FEF2575-%Change-Post: -10 %
FEF2575-%Pred-Post: 81 %
FEF2575-%Pred-Pre: 90 %
FEV1-%Change-Post: -2 %
FEV1-%Pred-Post: 74 %
FEV1-%Pred-Pre: 76 %
FEV1-Post: 1.7 L
FEV1-Pre: 1.74 L
FEV1FVC-%Change-Post: 0 %
FEV1FVC-%Pred-Pre: 105 %
FEV6-%Change-Post: -2 %
FEV6-%Pred-Post: 71 %
FEV6-%Pred-Pre: 73 %
FEV6-Post: 1.98 L
FEV6-Pre: 2.03 L
FEV6FVC-%Pred-Post: 102 %
FEV6FVC-%Pred-Pre: 102 %
FVC-%Change-Post: -2 %
FVC-%Pred-Post: 69 %
FVC-%Pred-Pre: 71 %
FVC-Post: 1.98 L
FVC-Pre: 2.03 L
Post FEV1/FVC ratio: 86 %
Post FEV6/FVC ratio: 100 %
Pre FEV1/FVC ratio: 86 %
Pre FEV6/FVC Ratio: 100 %
RV % pred: 85 %
RV: 1.56 L
TLC % pred: 71 %
TLC: 3.62 L

## 2021-11-25 MED ORDER — AZITHROMYCIN 250 MG PO TABS: ORAL_TABLET | ORAL | 0 refills | Status: DC

## 2021-11-25 MED ORDER — ALBUTEROL SULFATE HFA 108 (90 BASE) MCG/ACT IN AERS: 2.0000 | INHALATION_SPRAY | Freq: Four times a day (QID) | RESPIRATORY_TRACT | 5 refills | Status: DC | PRN

## 2021-11-25 MED ORDER — ADVAIR HFA 230-21 MCG/ACT IN AERO: 2.0000 | INHALATION_SPRAY | Freq: Two times a day (BID) | RESPIRATORY_TRACT | 5 refills | Status: DC

## 2021-11-25 MED ORDER — PREDNISONE 10 MG PO TABS: ORAL_TABLET | ORAL | 0 refills | Status: AC

## 2021-11-25 NOTE — Patient Instructions (Signed)
COPD exacerbation --STOP AirDuo 232-14 mcg  --START Advair 230-21 mcg TWO puffs TWICE a day --CONTINUE Albuterol every 4 hours AS NEEDED --CONTINUE Albuterol nebulizer q6h AS NEEDED  Chronic cough --Increase omeprazole 20 mg twice a day  Right hilar enlargement - resolved  Thyroid enlargement --Will send PCP message for further management. Patient aware  Follow-up with me in 2 months

## 2021-11-25 NOTE — Progress Notes (Signed)
Subjective:   PATIENT ID: [name] GENDER: female DOB: 1971-03-21, MRN: [id number]   HPI  Chief Complaint  Patient presents with   Follow-up    CAT results Productive cough - white mucus  PFT review    Reason for Visit: Follow-up COPD  [name] is a 51 year old female former smoker with hx TIA and chronic headaches who presents for follow-up  Initial consult She reports long standing of respiratory issues including frequent pneumonias as a child and once during her pregnancy at age 1. She has had worsening unproductive cough in the last two years with 3-5 urgent care/ED visits in that timeframe. She reports upper airway congestion and wheezing. Sometimes has post-tussive emesis. Cough occurs all day but seems to occur more at night.  ED note from 06/30/21 by NP Vickki Muff reviewed. She presented with productive cough, nasal congestion and wheezing x 1 week. Treated with Augmentin, prednisone and benzonatate. Advised to be seen by Pulmonary for COPD exacerbation follow-up. She has been on AirDuo Resp[place]lick that she is intermittently is compliant. After she finished her steroids, she is having recurrent wheezing and cough. Reports reflux on omeprazole  11/25/21 Since our last visit she has a COPD flare up in December. She recovered since then but in the last week she has been having shortness of breath and productive cough but difficult to bring up sputum. Has been mucinex for the last two weeks. Intermittent wheezing. Denies fevers, chills. Neg home COVID-19 test five days ago.  For her cough, she   Social History: Quit 5 years ago. Started 51 years old. 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  Past Medical History:  Diagnosis Date   Anemia    Anxiety    Arthritis    right knee, back   COPD (chronic obstructive pulmonary disease) (HCC)    GERD (gastroesophageal reflux disease)     Headache(784.0)    History of blood transfusion    Hx; of in 1991 after delivery   History of TIAs    Numbness    Right hand   Pneumonia    Stroke ([place]) 05/2013     Family History  Problem Relation Age of Onset   Renal Disease Father        dialysis   Hypertension Father    Heart disease Mother    Heart disease Maternal Grandfather    Asthma Sister    Asthma Maternal Aunt      Social History   Occupational History   Occupation: Forensic psychologist: [employer]  Tobacco Use   Smoking status: Former    Packs/day: 0.50    Years: 26.00    Pack years: 13.00    Types: Cigarettes    Quit date: 10/09/2015    Years since quitting: 6.1   Smokeless tobacco: Former  Scientific laboratory technician Use: Never used  Substance and Sexual Activity   Alcohol use: Not Currently    Alcohol/week: 0.0 standard drinks    Comment: occ   Drug use: No   Sexual activity: Not Currently    Birth control/protection: Surgical    Allergies  Allergen Reactions   Shellfish Allergy Itching and Swelling   Amoxicillin Other (See Comments)    Yeast infection   Latex Itching and Rash     Outpatient Medications Prior to Visit  Medication Sig Dispense Refill  albuterol (VENTOLIN HFA) 108 (90 Base) MCG/ACT inhaler Inhale 2 puffs into the lungs every 6 (six) hours as needed for wheezing or shortness of breath. 8 g 5   ferrous sulfate 325 (65 FE) MG tablet Take 1 tablet (325 mg total) by mouth every other day. 90 tablet 0   Fluticasone-Salmeterol 232-14 MCG/ACT AEPB Inhale 1 puff into the lungs in the morning and at bedtime. 1 each 5   omeprazole (PRILOSEC) 20 MG capsule Take 20 mg by mouth daily.     albuterol (PROVENTIL) (2.5 MG/3ML) 0.083% nebulizer solution Take 3 mLs by nebulization every 6 (six) hours as needed for wheezing or shortness of breath. 180 mL 1   aspirin 81 MG chewable tablet Chew 1 tablet (81 mg total) by mouth 2 (two) times daily. (Patient not taking: Reported on 09/22/2021) 60 tablet 0    escitalopram (LEXAPRO) 10 MG tablet Take 1 tablet (10 mg total) by mouth at bedtime. 30 tablet 0   ferrous sulfate 325 (65 FE) MG tablet TAKE 1 TABLET (325 MG TOTAL) BY MOUTH DAILY WITH BREAKFAST. (Patient not taking: Reported on 09/22/2021) 90 tablet 0   ferumoxytol (FERAHEME) 510 MG/17ML SOLN injection Inject 17 mLs (510 mg total) into the vein once for 1 dose. (Patient not taking: Reported on 02/05/2021) 17 mL 0   ferumoxytol (FERAHEME) 510 MG/17ML SOLN injection INJECT 17 MLS (510 MG TOTAL) INTO THE VEIN ONCE FOR 1 DOSE. (Patient not taking: Reported on 09/22/2021) 17 mL 0   ipratropium (ATROVENT) 0.06 % nasal spray PLACE 2 SPRAYS INTO BOTH NOSTRILS 4 TIMES DAILY. (Patient not taking: Reported on 11/25/2021) 15 mL 12   azithromycin (ZITHROMAX) 250 MG tablet Take two tablets on Day 1. Day 2-5, take one tablet once a day. 6 tablet 0   benzonatate (TESSALON) 100 MG capsule Take 1 capsule (100 mg total) by mouth every 8 (eight) hours as needed for cough. (Patient not taking: Reported on 09/22/2021) 21 capsule 0   fluconazole (DIFLUCAN) 150 MG tablet Take 1 tablet (150 mg total) by mouth daily. 1 tablet 0   promethazine-dextromethorphan (PROMETHAZINE-DM) 6.25-15 MG/5ML syrup Take 5 mLs by mouth 4 (four) times daily as needed for cough. (Patient not taking: Reported on 09/22/2021) 140 mL 0   No facility-administered medications prior to visit.    Review of Systems  Constitutional:  Negative for chills, diaphoresis, fever, malaise/fatigue and weight loss.  HENT:  Negative for congestion.   Respiratory:  Positive for cough, sputum production, shortness of breath and wheezing. Negative for hemoptysis.   Cardiovascular:  Negative for chest pain, palpitations and leg swelling.    Objective:   Vitals:   11/25/21 1057  BP: 128/60  Pulse: 90  Temp: 97.6 F (36.4 C)  TempSrc: Oral  SpO2: 97%  Weight: 219 lb 3.2 oz (99.4 kg)  Height: 5\' 8"  (1.727 m)   SpO2: 97 % O2 Device: None (Room  air)  Physical Exam: General: Well-appearing, no acute distress HENT: , AT Eyes: EOMI, no scleral icterus Respiratory: Clear to auscultation bilaterally.  No crackles, wheezing or rales Cardiovascular: RRR, -M/R/G, no JVD Extremities:-Edema,-tenderness Neuro: AAO x4, CNII-XII grossly intact Psych: Normal mood, normal affect  Data Reviewed:  Imaging: CTA 11/01/15 - No pulmonary embolism. Bilateral patchy opacities. Right hilar node measuring 1.4 cm CXR 09/22/21 - Left infrahilar atelectasis. Cervical hardware CT Chest 11/11/21 - Resolution of right hilar adenopathy. Bibasilar scarring. Increased right thyroid lobe size.   PFT: 11/25/21 FVC 1.98 (69%) FEV1 1.7 (74%) Ratio 86  TLC 71% DLCO 73% Interpretation: Mild restrictive defect with mildly reduced gas exchange. No significant bronchodilator response. F-V loops with minimal airway disease.  Labs: CBC    Component Value Date/Time   WBC 6.7 02/09/2021 1515   WBC 7.3 12/31/2020 0836   RBC 4.78 02/09/2021 1515   RBC 4.84 02/09/2021 1514   HGB 12.2 02/09/2021 1515   HGB 12.4 12/15/2020 1137   HCT 38.9 02/09/2021 1515   HCT 40.5 12/15/2020 1137   PLT 248 02/09/2021 1515   PLT 308 12/15/2020 1137   MCV 81.4 02/09/2021 1515   MCV 75 (L) 12/15/2020 1137   MCH 25.5 (L) 02/09/2021 1515   MCHC 31.4 02/09/2021 1515   RDW 20.0 (H) 02/09/2021 1515   RDW 21.7 (H) 11/17/2020 1426   LYMPHSABS 1.6 02/09/2021 1515   LYMPHSABS 2.4 12/15/2020 1137   MONOABS 0.4 02/09/2021 1515   EOSABS 0.0 02/09/2021 1515   EOSABS 0.0 12/15/2020 1137   BASOSABS 0.0 02/09/2021 1515   BASOSABS 0.0 12/15/2020 1137   Absolute eos 02/09/21 - 0     Assessment & Plan:   Discussion: 51 year old female former smoker with hx TIA and chronic headaches who presents for follow-up. Reviewed CT chest with resolution of right hilar mass. PFTs with mild restrictive defect and mild reduction in gas exchange likely related scarring.  COPD with chronic  bronchitis COPD exacerbation  Mild restrictive defect --Prednisone and azithromycin ordered --STOP AirDuo 232-14 mcg  --START Advair 230-21 mcg TWO puffs TWICE a day --CONTINUE Albuterol every 4 hours AS NEEDED --CONTINUE Albuterol nebulizer q6h AS NEEDED  Chronic cough --Increase omeprazole 20 mg twice a day  Right hilar enlargement - resolved  Thyroid enlargement --Will send PCP message for further management. Patient aware  Health Maintenance Immunization History  Administered Date(s) Administered   Influenza,inj,Quad PF,6+ Mos 09/04/2015, 11/11/2016   Influenza-Unspecified 08/08/2014, 08/22/2021   Moderna Sars-Covid-2 Vaccination 11/10/2019, 12/08/2019, 09/16/2020   Pneumococcal Polysaccharide-23 05/21/2013   Tdap 12/15/2020   CT Lung Screen - not indicated  No orders of the defined types were placed in this encounter.  Meds ordered this encounter  Medications   fluticasone-salmeterol (ADVAIR HFA) 230-21 MCG/ACT inhaler    Sig: Inhale 2 puffs into the lungs 2 (two) times daily.    Dispense:  1 each    Refill:  5   albuterol (VENTOLIN HFA) 108 (90 Base) MCG/ACT inhaler    Sig: Inhale 2 puffs into the lungs every 6 (six) hours as needed for wheezing or shortness of breath.    Dispense:  8 g    Refill:  5   azithromycin (ZITHROMAX Z-PAK) 250 MG tablet    Sig: Take as directed    Dispense:  6 each    Refill:  0   predniSONE (DELTASONE) 10 MG tablet    Sig: Take 4 tablets (40 mg total) by mouth daily with breakfast for 2 days, THEN 3 tablets (30 mg total) daily with breakfast for 2 days, THEN 2 tablets (20 mg total) daily with breakfast for 2 days, THEN 1 tablet (10 mg total) daily with breakfast for 2 days.    Dispense:  20 tablet    Refill:  0   Return in about 2 months (around 01/23/2022).  I have spent a total time of 35-minutes on the day of the appointment reviewing prior documentation, coordinating care and discussing medical diagnosis and plan with the  patient/family. Past medical history, allergies, medications were reviewed. Pertinent imaging, labs and tests included  in this note have been reviewed and interpreted independently by me.  [place], MD [facility] 11/25/2021 11:52 AM  Office Number [phone]

## 2021-11-25 NOTE — Progress Notes (Signed)
PFT done today. 

## 2021-11-27 ENCOUNTER — Other Ambulatory Visit (HOSPITAL_COMMUNITY): Payer: Self-pay

## 2021-12-01 ENCOUNTER — Encounter: Payer: Self-pay | Admitting: Pulmonary Disease

## 2021-12-01 DIAGNOSIS — E041 Nontoxic single thyroid nodule: Secondary | ICD-10-CM | POA: Insufficient documentation

## 2021-12-29 ENCOUNTER — Telehealth: Payer: Self-pay | Admitting: Pulmonary Disease

## 2021-12-29 MED ORDER — AZITHROMYCIN 250 MG PO TABS: ORAL_TABLET | ORAL | 0 refills | Status: DC

## 2021-12-29 MED ORDER — PREDNISONE 10 MG PO TABS: ORAL_TABLET | ORAL | 0 refills | Status: AC

## 2021-12-29 NOTE — Telephone Encounter (Signed)
Prednisone and azithromycin ordered. Advise patient to seek medical evaluation with appointment or ED if symptoms worsen or non-responsive to treatment in the next 24-72 hours.

## 2021-12-29 NOTE — Telephone Encounter (Signed)
Patient is aware of below message/recommendations and voiced her understanding.  Nothing further needed.  

## 2021-12-29 NOTE — Telephone Encounter (Signed)
Spoke to patient.  She is requesting Abx and prednisone for bronchitis.  C/o prod cough with white sputum, runny nose clear in color,  wheezing and sob with coughing spells x1w. Denied f/c/s or additional sx.  She is using albuterol BID and Advair BID. Not currently taking OTC meds to help with sx.   Dr. Loanne Drilling, please advise. Thanks

## 2022-01-02 ENCOUNTER — Emergency Department (HOSPITAL_BASED_OUTPATIENT_CLINIC_OR_DEPARTMENT_OTHER): Payer: PRIVATE HEALTH INSURANCE

## 2022-01-02 ENCOUNTER — Emergency Department (HOSPITAL_BASED_OUTPATIENT_CLINIC_OR_DEPARTMENT_OTHER)
Admission: EM | Admit: 2022-01-02 | Discharge: 2022-01-02 | Disposition: A | Discharge status: Home / Self Care | Payer: PRIVATE HEALTH INSURANCE | Attending: Emergency Medicine | Admitting: Emergency Medicine

## 2022-01-02 ENCOUNTER — Encounter (HOSPITAL_BASED_OUTPATIENT_CLINIC_OR_DEPARTMENT_OTHER): Payer: Self-pay | Admitting: Emergency Medicine

## 2022-01-02 ENCOUNTER — Other Ambulatory Visit: Payer: Self-pay

## 2022-01-02 DIAGNOSIS — R519 Headache, unspecified: Secondary | ICD-10-CM | POA: Diagnosis present

## 2022-01-02 DIAGNOSIS — I10 Essential (primary) hypertension: Secondary | ICD-10-CM | POA: Diagnosis not present

## 2022-01-02 DIAGNOSIS — J449 Chronic obstructive pulmonary disease, unspecified: Secondary | ICD-10-CM | POA: Insufficient documentation

## 2022-01-02 LAB — COMPREHENSIVE METABOLIC PANEL
ALT: 20 U/L (ref 0–44)
AST: 22 U/L (ref 15–41)
Albumin: 4 g/dL (ref 3.5–5.0)
Alkaline Phosphatase: 72 U/L (ref 38–126)
Anion gap: 8 (ref 5–15)
BUN: 19 mg/dL (ref 6–20)
CO2: 27 mmol/L (ref 22–32)
Calcium: 8.9 mg/dL (ref 8.9–10.3)
Chloride: 101 mmol/L (ref 98–111)
Creatinine, Ser: 1 mg/dL (ref 0.44–1.00)
GFR, Estimated: >60 mL/min (ref >60)
Glucose, Bld: 117 mg/dL — ABNORMAL HIGH (ref 70–99)
Potassium: 3.8 mmol/L (ref 3.5–5.1)
Sodium: 136 mmol/L (ref 135–145)
Total Bilirubin: 0.4 mg/dL (ref 0.3–1.2)
Total Protein: 7.7 g/dL (ref 6.5–8.1)

## 2022-01-02 LAB — CBC WITH DIFFERENTIAL/PLATELET
Abs Immature Granulocytes: 0.04 10*3/uL (ref 0.00–0.07)
Basophils Absolute: 0 10*3/uL (ref 0.0–0.1)
Basophils Relative: 0 %
Eosinophils Absolute: 0 10*3/uL (ref 0.0–0.5)
Eosinophils Relative: 0 %
HCT: 40.5 % (ref 36.0–46.0)
Hemoglobin: 12.6 g/dL (ref 12.0–15.0)
Immature Granulocytes: 1 %
Lymphocytes Relative: 30 %
Lymphs Abs: 2.4 10*3/uL (ref 0.7–4.0)
MCH: 23.6 pg — ABNORMAL LOW (ref 26.0–34.0)
MCHC: 31.1 g/dL (ref 30.0–36.0)
MCV: 75.8 fL — ABNORMAL LOW (ref 80.0–100.0)
Monocytes Absolute: 0.3 10*3/uL (ref 0.1–1.0)
Monocytes Relative: 4 %
Neutro Abs: 5.2 10*3/uL (ref 1.7–7.7)
Neutrophils Relative %: 65 %
Platelets: 310 10*3/uL (ref 150–400)
RBC: 5.34 MIL/uL — ABNORMAL HIGH (ref 3.87–5.11)
RDW: 17.6 % — ABNORMAL HIGH (ref 11.5–15.5)
WBC: 7.9 10*3/uL (ref 4.0–10.5)
nRBC: 0 % (ref 0.0–0.2)

## 2022-01-02 MED ORDER — ONDANSETRON HCL 4 MG/2ML IJ SOLN
4.0000 mg | Freq: Once | INTRAMUSCULAR | Status: AC
  Administered 2022-01-02: 4 mg via INTRAVENOUS
  Filled 2022-01-02: qty 2

## 2022-01-02 MED ORDER — AMLODIPINE BESYLATE 5 MG PO TABS: 5.0000 mg | ORAL_TABLET | Freq: Every day | ORAL | 0 refills | Status: DC

## 2022-01-02 NOTE — ED Triage Notes (Signed)
Pt states she was at work and has had a headache all day  Pt states she has been taking excedrin without relief  Pt states they checked her blood pressure at work and it was elevated so they sent her here to be evaluated

## 2022-01-02 NOTE — Discharge Instructions (Signed)
You are seen in the emergency room today with elevated blood pressure and headache.  Your lab work here is reassuring and I am starting you on a low-dose blood pressure medication to start.  This may cause some swelling in the feet and ankles.  Please follow close with your primary care doctor to recheck your blood pressure and make any medication adjustments as needed.

## 2022-01-02 NOTE — ED Provider Notes (Signed)
Emergency Department Provider Note   I have reviewed the triage vital signs and the nursing notes.   HISTORY  Chief Complaint Headache and Hypertension   HPI [name] is a 51 y.o. female presents to the ED with HA. She has a frontal HA without weakness/numbness. No vision changes. Denies any sudden onset, maximal intensity HA. No fever. She was at work and someone took her BP and found it to be high and so she was directed to the ED.     Past Medical History:  Diagnosis Date   Anemia    Anxiety    Arthritis    right knee, back   COPD (chronic obstructive pulmonary disease) (HCC)    GERD (gastroesophageal reflux disease)    Headache(784.0)    History of blood transfusion    Hx; of in 1991 after delivery   History of TIAs    Numbness    Right hand   Pneumonia    Stroke ([place]) 05/2013    Review of Systems  Constitutional: No fever/chills Eyes: No visual changes. ENT: No sore throat. Cardiovascular: Denies chest pain. Positive elevated BP.  Respiratory: Denies shortness of breath. Gastrointestinal: No abdominal pain.  No nausea, no vomiting.  No diarrhea.  No constipation. Genitourinary: Negative for dysuria. Musculoskeletal: Negative for back pain. Skin: Negative for rash. Neurological: Negative for focal weakness or numbness. Positive HA.    ____________________________________________   PHYSICAL EXAM:  VITAL SIGNS: ED Triage Vitals  Enc Vitals Group     BP 01/02/22 0336 (!) 190/120     Pulse Rate 01/02/22 0336 (!) 116     Resp 01/02/22 0336 16     Temp 01/02/22 0336 98.4 F (36.9 C)     Temp Source 01/02/22 0336 Oral     SpO2 01/02/22 0336 97 %     Weight 01/02/22 0332 210 lb (95.3 kg)     Height 01/02/22 0332 5' 3.5" (1.613 m)   Constitutional: Alert and oriented. Well appearing and in no acute distress. Eyes: Conjunctivae are normal. PERRL. EOMI. Head: Atraumatic. Nose: No congestion/rhinnorhea. Mouth/Throat: Mucous membranes are moist.   Neck: No stridor.   Cardiovascular: Normal rate, regular rhythm. Good peripheral circulation. Grossly normal heart sounds.   Respiratory: Normal respiratory effort.  No retractions. Lungs CTAB. Gastrointestinal: Soft and nontender. No distention.  Musculoskeletal: No lower extremity tenderness nor edema. No gross deformities of extremities. Neurologic:  Normal speech and language. No gross focal neurologic deficits are appreciated.  Skin:  Skin is warm, dry and intact. No rash noted.   ____________________________________________   LABS (all labs ordered are listed, but only abnormal results are displayed)  Labs Reviewed  COMPREHENSIVE METABOLIC PANEL - Abnormal; Notable for the following components:      Result Value   Glucose, Bld 117 (*)    All other components within normal limits  CBC WITH DIFFERENTIAL/PLATELET - Abnormal; Notable for the following components:   RBC 5.34 (*)    MCV 75.8 (*)    MCH 23.6 (*)    RDW 17.6 (*)    All other components within normal limits   ____________________________________________  EKG   EKG Interpretation  Date/Time:  Saturday January 02 2022 03:47:14 EST Ventricular Rate:  108 PR Interval:  121 QRS Duration: 104 QT Interval:  386 QTC Calculation: 518 R Axis:   23 Text Interpretation: Sinus tachycardia Low voltage, precordial leads Prolonged QT interval Baseline wander in lead(s) V4 V6 ST depressions similar to 2016 tracing Confirmed by  Nanda Quinton ([id number]) on 01/02/2022 3:53:06 AM       ____________________________________________   PROCEDURES  Procedure(s) performed:   Procedures  None ____________________________________________   INITIAL IMPRESSION / ASSESSMENT AND PLAN / ED COURSE  Pertinent labs & imaging results that were available during my care of the patient were reviewed by me and considered in my medical decision making (see chart for details).   This patient is Presenting for Evaluation of HA, which does  require a range of treatment options, and is a complaint that involves a high risk of morbidity and mortality.  The Differential Diagnoses includes but is not exclusive to subarachnoid hemorrhage, meningitis, encephalitis, previous head trauma, cavernous venous thrombosis, muscle tension headache, glaucoma, temporal arteritis, migraine or migraine equivalent, etc.   Critical Interventions- IV zofran   Medications  ondansetron (ZOFRAN) injection 4 mg (4 mg Intravenous Given 01/02/22 0445)    Reassessment after intervention:  Nausea improved.     Clinical Laboratory Tests Ordered, included CMP and CBC without evidence of AKI, electrolyte disturbance or anemia.   Radiologic Tests Ordered, included CT head. I independently interpreted the images and agree with radiology interpretation.   Cardiac Monitor Tracing which shows NSR.   Social Determinants of Health Risk patient is a former smoker.   Medical Decision Making: Summary:  Patient presents to the ED with gradual onset HA and elevated BP. No focal neuro changes to suspect CVA. No encephalopathy. No history features to strongly suspect SAH. CT head without acute findings. Labs without evidence of HTN emergency. Will start low-dose Amlodipine and have patient f/u with PCP for re-check and titration.   Reevaluation with update and discussion with patient regarding labs/imaging and CT findings. Plan to start HTN med with plan for PCP follow up.   Disposition: discharge  ____________________________________________  FINAL CLINICAL IMPRESSION(S) / ED DIAGNOSES  Final diagnoses:  Primary hypertension  Bad headache     NEW OUTPATIENT MEDICATIONS STARTED DURING THIS VISIT:  Discharge Medication List as of 01/02/2022  5:09 AM     START taking these medications   Details  amLODipine (NORVASC) 5 MG tablet Take 1 tablet (5 mg total) by mouth daily., Starting Sat 01/02/2022, Normal        Note:  This document was prepared using  Dragon voice recognition software and may include unintentional dictation errors.  Nanda Quinton, MD, [facility]    Fredrik Mogel, [facility], MD 01/06/22 1215

## 2022-01-04 ENCOUNTER — Ambulatory Visit (INDEPENDENT_AMBULATORY_CARE_PROVIDER_SITE_OTHER): Payer: PRIVATE HEALTH INSURANCE | Admitting: Physician Assistant

## 2022-01-04 ENCOUNTER — Encounter: Payer: Self-pay | Admitting: Physician Assistant

## 2022-01-04 ENCOUNTER — Other Ambulatory Visit: Payer: Self-pay

## 2022-01-04 ENCOUNTER — Encounter: Payer: Self-pay | Admitting: Family

## 2022-01-04 VITALS — BP 170/99 | HR 88 | Temp 98.2°F | Resp 18 | Ht 63.5 in | Wt 216.0 lb

## 2022-01-04 DIAGNOSIS — I10 Essential (primary) hypertension: Secondary | ICD-10-CM | POA: Diagnosis not present

## 2022-01-04 DIAGNOSIS — D509 Iron deficiency anemia, unspecified: Secondary | ICD-10-CM | POA: Diagnosis not present

## 2022-01-04 DIAGNOSIS — Z6837 Body mass index (BMI) 37.0-37.9, adult: Secondary | ICD-10-CM

## 2022-01-04 DIAGNOSIS — G43809 Other migraine, not intractable, without status migrainosus: Secondary | ICD-10-CM

## 2022-01-04 DIAGNOSIS — E041 Nontoxic single thyroid nodule: Secondary | ICD-10-CM | POA: Diagnosis not present

## 2022-01-04 MED ORDER — SUMATRIPTAN SUCCINATE 50 MG PO TABS: 50.0000 mg | ORAL_TABLET | ORAL | 0 refills | Status: DC | PRN

## 2022-01-04 NOTE — Progress Notes (Signed)
Established Patient Office Visit  Subjective:  Patient ID: [name], female    DOB: 1970-12-03  Age: 51 y.o. MRN: [id number]  CC:  Chief Complaint  Patient presents with   Blood Pressure Check    HPI [name] states that she was seen in the emergency department on January 02, 2022, after complaining of a headache all day at work, states that her blood pressure was checked at work and she it was elevated so she presented to the emergency department where it was elevated.  Unfortunately emergency department note is not yet completed.  States that they did start her on amlodipine 5 mg, states that she has taken it for the past 2 days, states that her blood pressure has not improved.  Reports that she continues to have frontal headaches, states that she has been having photophobia, states that she was having nausea but that has since resolved.  States that she has been using Excedrin without relief.  Does endorse increased stress level due to work environment.  Does endorse history of iron infusion.  Does endorse history of abnormal thyroid imaging which she has not been able to follow-up on.  States that she has not been treated for hypertension in the past, had an office visit on November 25, 2021, states her blood pressure was 128/60 at that time.   States that she does not drink much water on a daily basis.  States that she does have difficulty sleeping, believes it is due to stress and her work schedule.  States that she has previously tried melatonin with some relief.   Not much water Excedrin not helping    headache in front  - had nausea went to ed and nausea is better  - having photophobia  Has tried melatonin    Past Medical History:  Diagnosis Date   Anemia    Anxiety    Arthritis    right knee, back   COPD (chronic obstructive pulmonary disease) (HCC)    GERD (gastroesophageal reflux disease)    Headache(784.0)    History of blood transfusion     Hx; of in 1991 after delivery   History of TIAs    Numbness    Right hand   Pneumonia    Stroke ([place]) 05/2013    Past Surgical History:  Procedure Laterality Date   ANTERIOR CERVICAL DECOMP/DISCECTOMY FUSION N/A 03/06/2014   Procedure: ANTERIOR CERVICAL DECOMPRESSION/DISCECTOMY FUSION 2 LEVELS four/five, five/six;  Surgeon: Eustace Moore, MD;  Location: [facility] NEURO ORS;  Service: Neurosurgery;  Laterality: N/A;   CESAREAN SECTION     x 1 with 5th pregnancy   CHOLECYSTECTOMY     KNEE ARTHROSCOPY Right 2015   TEE WITHOUT CARDIOVERSION N/A 05/21/2013   Procedure: TRANSESOPHAGEAL ECHOCARDIOGRAM (TEE);  Surgeon: Sueanne Margarita, MD;  Location: [facility];  Service: Cardiovascular;  Laterality: N/A;   TOTAL KNEE ARTHROPLASTY Right 01/06/2021   TOTAL KNEE ARTHROPLASTY Right 01/06/2021   Procedure: RIGHT TOTAL KNEE ARTHROPLASTY;  Surgeon: Meredith Pel, MD;  Location: [facility];  Service: Orthopedics;  Laterality: Right;   TUBAL LIGATION      Family History  Problem Relation Age of Onset   Renal Disease Father        dialysis   Hypertension Father    Heart disease Mother    Heart disease Maternal Grandfather    Asthma Sister    Asthma Maternal Aunt     Social History   Socioeconomic History  Marital status: Divorced    Spouse name: Not on file   Number of children: 7   Years of education: GEd   Highest education level: Not on file  Occupational History   Occupation: CNA    Employer: [employer]  Tobacco Use   Smoking status: Former    Packs/day: 0.50    Years: 26.00    Pack years: 13.00    Types: Cigarettes    Quit date: 10/09/2015    Years since quitting: 6.2   Smokeless tobacco: Former  Scientific laboratory technician Use: Never used  Substance and Sexual Activity   Alcohol use: Not Currently    Alcohol/week: 0.0 standard drinks    Comment: occ   Drug use: No   Sexual activity: Not Currently    Birth control/protection: Surgical  Other Topics Concern   Not on file   Social History Narrative   Patient lives at home with her family    She has 7 children   5 grown and out of the house   2 at home 63 and 66   She works as a Quarry manager at [employer] Strain: Not on [employer] Insecurity: Not on file  Transportation Needs: Not on file  Physical Activity: Not on file  Stress: Not on file  Social Connections: Not on file  Intimate Partner Violence: Not on file    Outpatient Medications Prior to Visit  Medication Sig Dispense Refill   albuterol (PROVENTIL) (2.5 MG/3ML) 0.083% nebulizer solution Take 3 mLs by nebulization every 6 (six) hours as needed for wheezing or shortness of breath. 180 mL 1   albuterol (VENTOLIN HFA) 108 (90 Base) MCG/ACT inhaler Inhale 2 puffs into the lungs every 6 (six) hours as needed for wheezing or shortness of breath. 8 g 5   amLODipine (NORVASC) 5 MG tablet Take 1 tablet (5 mg total) by mouth daily. 30 tablet 0   aspirin 81 MG chewable tablet Chew 1 tablet (81 mg total) by mouth 2 (two) times daily. (Patient not taking: Reported on 09/22/2021) 60 tablet 0   azithromycin (ZITHROMAX) 250 MG tablet Take as instructed. 6 tablet 0   escitalopram (LEXAPRO) 10 MG tablet Take 1 tablet (10 mg total) by mouth at bedtime. 30 tablet 0   ferrous sulfate 325 (65 FE) MG tablet Take 1 tablet (325 mg total) by mouth every other day. 90 tablet 0   ferrous sulfate 325 (65 FE) MG tablet TAKE 1 TABLET (325 MG TOTAL) BY MOUTH DAILY WITH BREAKFAST. (Patient not taking: Reported on 09/22/2021) 90 tablet 0   ferumoxytol (FERAHEME) 510 MG/17ML SOLN injection Inject 17 mLs (510 mg total) into the vein once for 1 dose. (Patient not taking: Reported on 02/05/2021) 17 mL 0   ferumoxytol (FERAHEME) 510 MG/17ML SOLN injection INJECT 17 MLS (510 MG TOTAL) INTO THE VEIN ONCE FOR 1 DOSE. (Patient not taking: Reported on 09/22/2021) 17 mL 0   fluticasone-salmeterol (ADVAIR HFA) 230-21 MCG/ACT inhaler Inhale 2  puffs into the lungs 2 (two) times daily. 1 each 5   Fluticasone-Salmeterol 232-14 MCG/ACT AEPB Inhale 1 puff into the lungs in the morning and at bedtime. 1 each 5   ipratropium (ATROVENT) 0.06 % nasal spray PLACE 2 SPRAYS INTO BOTH NOSTRILS 4 TIMES DAILY. (Patient not taking: Reported on 11/25/2021) 15 mL 12   omeprazole (PRILOSEC) 20 MG capsule Take 20 mg by mouth daily.     predniSONE (  DELTASONE) 10 MG tablet Take 4 tablets (40 mg total) by mouth daily with breakfast for 2 days, THEN 3 tablets (30 mg total) daily with breakfast for 2 days, THEN 2 tablets (20 mg total) daily with breakfast for 2 days, THEN 1 tablet (10 mg total) daily with breakfast for 2 days. 20 tablet 0   No facility-administered medications prior to visit.    Allergies  Allergen Reactions   Shellfish Allergy Itching and Swelling   Amoxicillin Other (See Comments)    Yeast infection   Latex Itching and Rash    ROS Review of Systems  Constitutional:  Negative for chills and fever.  HENT: Negative.    Eyes:  Positive for photophobia.  Respiratory:  Negative for shortness of breath.   Cardiovascular:  Negative for chest pain and palpitations.  Gastrointestinal:  Negative for nausea and vomiting.  Endocrine: Negative.   Genitourinary: Negative.   Musculoskeletal: Negative.   Skin: Negative.   Allergic/Immunologic: Negative.   Neurological:  Positive for headaches. Negative for dizziness, seizures, speech difficulty and weakness.  Hematological: Negative.   Psychiatric/Behavioral:  Positive for sleep disturbance. Negative for self-injury and suicidal ideas.      Objective:    Physical Exam Vitals and nursing note reviewed.  Constitutional:      General: She is not in acute distress.    Appearance: Normal appearance. She is obese. She is not ill-appearing.  HENT:     Head: Normocephalic and atraumatic.     Right Ear: External ear normal.     Left Ear: External ear normal.     Nose: Nose normal.      Mouth/Throat:     Mouth: Mucous membranes are moist.     Pharynx: Oropharynx is clear.  Eyes:     Extraocular Movements: Extraocular movements intact.     Conjunctiva/sclera: Conjunctivae normal.     Pupils: Pupils are equal, round, and reactive to light.  Cardiovascular:     Rate and Rhythm: Normal rate and regular rhythm.     Pulses: Normal pulses.     Heart sounds: Normal heart sounds.  Pulmonary:     Effort: Pulmonary effort is normal.     Breath sounds: Normal breath sounds.  Musculoskeletal:        General: Normal range of motion.     Cervical back: Normal range of motion and neck supple.  Skin:    General: Skin is warm and dry.  Neurological:     General: No focal deficit present.     Mental Status: She is alert and oriented to person, place, and time.     Cranial Nerves: No cranial nerve deficit.     Sensory: No sensory deficit.  Psychiatric:        Mood and Affect: Mood normal.        Behavior: Behavior normal.        Thought Content: Thought content normal.        Judgment: Judgment normal.    BP (!) 170/99 (BP Location: Right Arm, Patient Position: Sitting, Cuff Size: Large)    Pulse 88    Temp 98.2 F (36.8 C) (Oral)    Resp 18    Ht 5' 3.5" (1.613 m)    Wt 216 lb (98 kg)    LMP 12/18/2021    SpO2 97%    BMI 37.66 kg/m  Wt Readings from Last 3 Encounters:  01/04/22 216 lb (98 kg)  01/02/22 210 lb (95.3 kg)  11/25/21 219 lb  3.2 oz (99.4 kg)     Health Maintenance Due  Topic Date Due   Zoster Vaccines- Shingrix (1 of 2) Never done   COLONOSCOPY (Pts 45-15yrs Insurance coverage will need to be confirmed)  Never done   MAMMOGRAM  Never done   COVID-19 Vaccine (4 - Booster for Moderna series) 11/11/2020   PAP SMEAR-Modifier  12/29/2020    There are no preventive care reminders to display for this patient.  Lab Results  Component Value Date   TSH 1.390 01/04/2022   Lab Results  Component Value Date   WBC 7.9 01/02/2022   HGB 12.6 01/02/2022   HCT 40.5  01/02/2022   MCV 75.8 (L) 01/02/2022   PLT 310 01/02/2022   Lab Results  Component Value Date   NA 136 01/02/2022   K 3.8 01/02/2022   CO2 27 01/02/2022   GLUCOSE 117 (H) 01/02/2022   BUN 19 01/02/2022   CREATININE 1.00 01/02/2022   BILITOT 0.4 01/02/2022   ALKPHOS 72 01/02/2022   AST 22 01/02/2022   ALT 20 01/02/2022   PROT 7.7 01/02/2022   ALBUMIN 4.0 01/02/2022   CALCIUM 8.9 01/02/2022   ANIONGAP 8 01/02/2022   Lab Results  Component Value Date   CHOL 212 (H) 02/12/2020   Lab Results  Component Value Date   HDL 69 02/12/2020   Lab Results  Component Value Date   LDLCALC 121 (H) 02/12/2020   Lab Results  Component Value Date   TRIG 111 02/12/2020   Lab Results  Component Value Date   CHOLHDL 3.1 02/12/2020   Lab Results  Component Value Date   HGBA1C 5.4 02/12/2020      Assessment & Plan:   Problem List Items Addressed This Visit       Cardiovascular and Mediastinum   Essential hypertension - Primary   Relevant Orders   Thyroid Panel With TSH (Completed)   Migraine   Relevant Medications   SUMAtriptan (IMITREX) 50 MG tablet     Endocrine   Thyroid nodule greater than or equal to 1.5 cm in diameter incidentally noted on imaging study   Relevant Orders   Thyroid Panel With TSH (Completed)   US THYROID     Other   Obesity (Chronic)   IDA (iron deficiency anemia) (Chronic)   Relevant Orders   Iron, TIBC and Ferritin Panel (Completed)  1. Essential hypertension New onset hypertension diagnosis.  Patient encouraged to continue amlodipine at this time, check blood pressure at home on a daily basis, keep a written log and have available for all office visits.  Red flags given for prompt reevaluation.  Patient given appointment to follow-up with primary care provider in 4 weeks. - Thyroid Panel With TSH  2. Other migraine without status migrainosus, not intractable Trial sumatriptan, patient education given on supportive care, encouraged  increase in water intake, improve sleeping hygiene. - SUMAtriptan (IMITREX) 50 MG tablet; Take 1 tablet (50 mg total) by mouth every 2 (two) hours as needed for migraine. May repeat in 2 hours if headache persists or recurs.  Dispense: 10 tablet; Refill: 0  3. Thyroid nodule greater than or equal to 1.5 cm in diameter incidentally noted on imaging study  Increased size of a hypodense RIGHT thyroid lobe 2.1 x 1.6 cm; Recommend thyroid US (ref: J Am Coll Radiol. 2015 Feb;12(2): 143-50).   Previously identified mildly enlarged RIGHT hilar lymph node is inadequately assessed on this noncontrast exam, though no gross RIGHT hilar enlargement is noted.  Electronically Signed   By: Lavonia Dana M.D.   On: 11/11/2021 10:51 IMPRESSION: No acute intracranial abnormality.     Electronically Signed   By: Fidela Salisbury M.D.   On: 01/02/2022 04:33 - Thyroid Panel With TSH - US THYROID; Future  4. Iron deficiency anemia, unspecified iron deficiency anemia type  - Iron, TIBC and Ferritin Panel   I have reviewed the patient's medical history (PMH, PSH, Social History, Family History, Medications, and allergies) , and have been updated if relevant. I spent 30 minutes reviewing chart and  face to face time with patient.   Meds ordered this encounter  Medications   SUMAtriptan (IMITREX) 50 MG tablet    Sig: Take 1 tablet (50 mg total) by mouth every 2 (two) hours as needed for migraine. May repeat in 2 hours if headache persists or recurs.    Dispense:  10 tablet    Refill:  0    Order Specific Question:   Supervising Provider    Answer:   Elsie Stain [[id number]]    Follow-up: Return in about 4 weeks (around 02/01/2022) for with Durene Fruits, NP at [place] [facility].    Dawn Grip Mayers, PA-C

## 2022-01-04 NOTE — Patient Instructions (Signed)
You are going to use sumatriptan to help with your migraine headache.  I encourage you to increase your water intake, you should be drinking at least 64 ounces of water a day, I encourage you to aim for 7 to 8 hours of sleep daily, I encourage you to try melatonin to help you get proper rest.  You are going to continue taking the amlodipine at this time, I encourage you to check your blood pressure on a daily basis, keep a written log and have available for all office visits.  We will call you with today's lab results.  Kennieth Rad, PA-C Physician Assistant [place] [email or web address]   How to Take Your Blood Pressure Blood pressure is a measurement of how strongly your blood is pressing against the walls of your arteries. Arteries are blood vessels that carry blood from your heart throughout your body. Your health care provider takes your blood pressure at each office visit. You can also take your own blood pressure at home with a blood pressure monitor. You may need to take your own blood pressure to: Confirm a diagnosis of high blood pressure (hypertension). Monitor your blood pressure over time. Make sure your blood pressure medicine is working. Supplies needed: Blood pressure monitor. Dining room chair to sit in. Table or desk. Small notebook and pencil or pen. How to prepare To get the most accurate reading, avoid the following for 30 minutes before you check your blood pressure: Drinking caffeine. Drinking alcohol. Eating. Smoking. Exercising. Five minutes before you check your blood pressure: Use the bathroom and urinate so that you have an empty bladder. Sit quietly in a dining room chair. Do not sit in a soft couch or an armchair. Do not talk. How to take your blood pressure To check your blood pressure, follow the instructions in the manual that came with your blood pressure monitor. If you have a digital blood  pressure monitor, the instructions may be as follows: Sit up straight in a chair. Place your feet on the floor. Do not cross your ankles or legs. Rest your left arm at the level of your heart on a table or desk or on the arm of a chair. Pull up your shirt sleeve. Wrap the blood pressure cuff around the upper part of your left arm, 1 inch (2.5 cm) above your elbow. It is best to wrap the cuff around bare skin. Fit the cuff snugly around your arm. You should be able to place only one finger between the cuff and your arm. Position the cord so that it rests in the bend of your elbow. Press the power button. Sit quietly while the cuff inflates and deflates. Read the digital reading on the monitor screen and write the numbers down (record them) in a notebook. Wait 2-3 minutes, then repeat the steps, starting at step 1. What does my blood pressure reading mean? A blood pressure reading consists of a higher number over a lower number. Ideally, your blood pressure should be below 120/80. The first ("top") number is called the systolic pressure. It is a measure of the pressure in your arteries as your heart beats. The second ("bottom") number is called the diastolic pressure. It is a measure of the pressure in your arteries as the heart relaxes. Blood pressure is classified into five stages. The following are the stages for adults who do not have a short-term serious illness or a chronic condition. Systolic pressure and diastolic pressure are measured in a  unit called mm Hg (millimeters of mercury).  Normal Systolic pressure: below 644. Diastolic pressure: below 80. Elevated Systolic pressure: 034-742. Diastolic pressure: below 80. Hypertension stage 1 Systolic pressure: 595-638. Diastolic pressure: 75-64. Hypertension stage 2 Systolic pressure: 332 or above. Diastolic pressure: 90 or above. You can have elevated blood pressure or hypertension even if only the systolic or only the diastolic number  in your reading is higher than normal. Follow these instructions at home: Medicines Take over-the-counter and prescription medicines only as told by your health care provider. Tell your health care provider if you are having any side effects from blood pressure medicine. General instructions Check your blood pressure as often as recommended by your health care provider. Check your blood pressure at the same time every day. Take your monitor to the next appointment with your health care provider to make sure that: You are using it correctly. It provides accurate readings. Understand what your goal blood pressure numbers are. Keep all follow-up visits as told by your health care provider. This is important. General tips Your health care provider can suggest a reliable monitor that will meet your needs. There are several types of home blood pressure monitors. Choose a monitor that has an arm cuff. Do not choose a monitor that measures your blood pressure from your wrist or finger. Choose a cuff that wraps snugly around your upper arm. You should be able to fit only one finger between your arm and the cuff. You can buy a blood pressure monitor at most drugstores or online. Where to find more information American Heart Association: www.heart.org Contact a health care provider if: Your blood pressure is consistently high. Your blood pressure is suddenly low. Get help right away if: Your systolic blood pressure is higher than 180. Your diastolic blood pressure is higher than 120. Summary Blood pressure is a measurement of how strongly your blood is pressing against the walls of your arteries. A blood pressure reading consists of a higher number over a lower number. Ideally, your blood pressure should be below 120/80. Check your blood pressure at the same time every day. Avoid caffeine, alcohol, smoking, and exercise for 30 minutes prior to checking your blood pressure. These agents can affect  the accuracy of the blood pressure reading. This information is not intended to replace advice given to you by your health care provider. Make sure you discuss any questions you have with your health care provider. Document Revised: 09/03/2020 Document Reviewed: 10/19/2019 Elsevier Patient Education  2022 [place].  Insomnia Insomnia is a sleep disorder that makes it difficult to fall asleep or stay asleep. Insomnia can cause fatigue, low energy, difficulty concentrating, mood swings, and poor performance at work or school. There are three different ways to classify insomnia: Difficulty falling asleep. Difficulty staying asleep. Waking up too early in the morning. Any type of insomnia can be long-term (chronic) or short-term (acute). Both are common. Short-term insomnia usually lasts for three months or less. Chronic insomnia occurs at least three times a week for longer than three months. What are the causes? Insomnia may be caused by another condition, situation, or substance, such as: Anxiety. Certain medicines. Gastroesophageal reflux disease (GERD) or other gastrointestinal conditions. Asthma or other breathing conditions. Restless legs syndrome, sleep apnea, or other sleep disorders. Chronic pain. Menopause. Stroke. Abuse of alcohol, tobacco, or illegal drugs. Mental health conditions, such as depression. Caffeine. Neurological disorders, such as Alzheimer's disease. An overactive thyroid (hyperthyroidism). Sometimes, the cause of insomnia may not  be known. What increases the risk? Risk factors for insomnia include: Gender. Women are affected more often than men. Age. Insomnia is more common as you get older. Stress. Lack of exercise. Irregular work schedule or working night shifts. Traveling between different time zones. Certain medical and mental health conditions. What are the signs or symptoms? If you have insomnia, the main symptom is having trouble falling  asleep or having trouble staying asleep. This may lead to other symptoms, such as: Feeling fatigued or having low energy. Feeling nervous about going to sleep. Not feeling rested in the morning. Having trouble concentrating. Feeling irritable, anxious, or depressed. How is this diagnosed? This condition may be diagnosed based on: Your symptoms and medical history. Your health care provider may ask about: Your sleep habits. Any medical conditions you have. Your mental health. A physical exam. How is this treated? Treatment for insomnia depends on the cause. Treatment may focus on treating an underlying condition that is causing insomnia. Treatment may also include: Medicines to help you sleep. Counseling or therapy. Lifestyle adjustments to help you sleep better. Follow these instructions at home: Eating and drinking  Limit or avoid alcohol, caffeinated beverages, and cigarettes, especially close to bedtime. These can disrupt your sleep. Do not eat a large meal or eat spicy foods right before bedtime. This can lead to digestive discomfort that can make it hard for you to sleep. Sleep habits  Keep a sleep diary to help you and your health care provider figure out what could be causing your insomnia. Write down: When you sleep. When you wake up during the night. How well you sleep. How rested you feel the next day. Any side effects of medicines you are taking. What you eat and drink. Make your bedroom a dark, comfortable place where it is easy to fall asleep. Put up shades or blackout curtains to block light from outside. Use a white noise machine to block noise. Keep the temperature cool. Limit screen use before bedtime. This includes: Watching TV. Using your smartphone, tablet, or computer. Stick to a routine that includes going to bed and waking up at the same times every day and night. This can help you fall asleep faster. Consider making a quiet activity, such as reading,  part of your nighttime routine. Try to avoid taking naps during the day so that you sleep better at night. Get out of bed if you are still awake after 15 minutes of trying to sleep. Keep the lights down, but try reading or doing a quiet activity. When you feel sleepy, go back to bed. General instructions Take over-the-counter and prescription medicines only as told by your health care provider. Exercise regularly, as told by your health care provider. Avoid exercise starting several hours before bedtime. Use relaxation techniques to manage stress. Ask your health care provider to suggest some techniques that may work well for you. These may include: Breathing exercises. Routines to release muscle tension. Visualizing peaceful scenes. Make sure that you drive carefully. Avoid driving if you feel very sleepy. Keep all follow-up visits as told by your health care provider. This is important. Contact a health care provider if: You are tired throughout the day. You have trouble in your daily routine due to sleepiness. You continue to have sleep problems, or your sleep problems get worse. Get help right away if: You have serious thoughts about hurting yourself or someone else. If you ever feel like you may hurt yourself or others, or have thoughts about  taking your own life, get help right away. You can go to your nearest emergency department or call: Your local emergency services (911 in the [place] A suicide crisis helpline, such as the [place] at 475-386-5502 or 988 in the [place]. This is open 24 hours a day. Summary Insomnia is a sleep disorder that makes it difficult to fall asleep or stay asleep. Insomnia can be long-term (chronic) or short-term (acute). Treatment for insomnia depends on the cause. Treatment may focus on treating an underlying condition that is causing insomnia. Keep a sleep diary to help you and your health care provider figure out what could be  causing your insomnia. This information is not intended to replace advice given to you by your health care provider. Make sure you discuss any questions you have with your health care provider. Document Revised: 05/20/2021 Document Reviewed: 09/04/2020 Elsevier Patient Education  2022 [employer].

## 2022-01-04 NOTE — Progress Notes (Signed)
Patient took BP medication today. Patient reports elevated BP numbers the past few days an hour after taking medication. Patient reports HA in the forehead currently.

## 2022-01-05 ENCOUNTER — Ambulatory Visit: Payer: PRIVATE HEALTH INSURANCE | Admitting: Family

## 2022-01-05 ENCOUNTER — Other Ambulatory Visit: Payer: Self-pay | Admitting: Physician Assistant

## 2022-01-05 ENCOUNTER — Encounter: Payer: Self-pay | Admitting: Physician Assistant

## 2022-01-05 DIAGNOSIS — D509 Iron deficiency anemia, unspecified: Secondary | ICD-10-CM

## 2022-01-05 DIAGNOSIS — I10 Essential (primary) hypertension: Secondary | ICD-10-CM | POA: Insufficient documentation

## 2022-01-05 DIAGNOSIS — G43909 Migraine, unspecified, not intractable, without status migrainosus: Secondary | ICD-10-CM | POA: Insufficient documentation

## 2022-01-05 LAB — THYROID PANEL WITH TSH
Free Thyroxine Index: 1.9 (ref 1.2–4.9)
T3 Uptake Ratio: 25 % (ref 24–39)
T4, Total: 7.6 ug/dL (ref 4.5–12.0)
TSH: 1.39 u[IU]/mL (ref 0.450–4.500)

## 2022-01-05 LAB — IRON,TIBC AND FERRITIN PANEL
Ferritin: 9 ng/mL — ABNORMAL LOW (ref 15–150)
Iron Saturation: 10 % — ABNORMAL LOW (ref 15–55)
Iron: 42 ug/dL (ref 27–159)
Total Iron Binding Capacity: 435 ug/dL (ref 250–450)
UIBC: 393 ug/dL (ref 131–425)

## 2022-01-05 MED ORDER — FERROUS SULFATE 325 (65 FE) MG PO TABS: 325.0000 mg | ORAL_TABLET | ORAL | 0 refills | Status: DC

## 2022-01-07 ENCOUNTER — Ambulatory Visit (HOSPITAL_COMMUNITY)
Admission: RE | Admit: 2022-01-07 | Discharge: 2022-01-07 | Disposition: A | Discharge status: Home / Self Care | Payer: PRIVATE HEALTH INSURANCE | Source: Ambulatory Visit | Attending: Physician Assistant | Admitting: Physician Assistant

## 2022-01-07 ENCOUNTER — Telehealth: Payer: Self-pay | Admitting: *Deleted

## 2022-01-07 ENCOUNTER — Other Ambulatory Visit: Payer: Self-pay

## 2022-01-07 DIAGNOSIS — E041 Nontoxic single thyroid nodule: Secondary | ICD-10-CM | POA: Insufficient documentation

## 2022-01-07 NOTE — Addendum Note (Signed)
Addended by: Kennieth Rad on: 01/07/2022 01:07 PM   Modules accepted: Orders

## 2022-01-07 NOTE — Telephone Encounter (Signed)
-----   Message from Kennieth Rad, [place] sent at 01/05/2022  4:03 PM EST ----- ?Please call patient and let her know that her thyroid blood test was within normal limits, her iron levels are low, she needs to continue with the oral iron every other day.  Prescription sent to her pharmacy. ?

## 2022-01-07 NOTE — Telephone Encounter (Signed)
Patient verified DOB ?Patient is aware of a 2.7 cm nodule being noted and needing to be scheduled for biopsy. ?Patient is also aware of labs being normal. ?Patient is aware of Biopsy being scheduled for 01/13/22 at 8:45am. ?Patient is aware of arrival time being a HARD 8:30 or appointment will be rescheduled and charged $75 NO SHOW FEE. Patient is aware of taking BP medication with water that morning and that she can eat and drink. Patient understands anyone accompanying her will have to wait in the car or main lobby. Patient will be able to drive herself home and should wear a shirt that reveals the neck and upper chest area. Patient also aware of wearing a mask and bringing her insurance card and photo ID. ?

## 2022-01-13 ENCOUNTER — Ambulatory Visit
Admission: RE | Admit: 2022-01-13 | Discharge: 2022-01-13 | Disposition: A | Discharge status: Home / Self Care | Payer: PRIVATE HEALTH INSURANCE | Source: Ambulatory Visit | Attending: Physician Assistant | Admitting: Physician Assistant

## 2022-01-13 ENCOUNTER — Other Ambulatory Visit (HOSPITAL_COMMUNITY)
Admission: RE | Admit: 2022-01-13 | Discharge: 2022-01-13 | Disposition: A | Discharge status: Home / Self Care | Payer: PRIVATE HEALTH INSURANCE | Source: Ambulatory Visit | Attending: Physician Assistant | Admitting: Physician Assistant

## 2022-01-13 DIAGNOSIS — E041 Nontoxic single thyroid nodule: Secondary | ICD-10-CM | POA: Insufficient documentation

## 2022-01-14 LAB — CYTOLOGY - NON PAP

## 2022-01-15 ENCOUNTER — Telehealth: Payer: Self-pay | Admitting: Physician Assistant

## 2022-01-15 NOTE — Telephone Encounter (Signed)
Patient called requesting a call re: her biopsy results. Please call pt. ?

## 2022-01-18 NOTE — Telephone Encounter (Signed)
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 Patient advised to a follow up with PCP for 6 month evaluations. ?

## 2022-01-18 NOTE — Telephone Encounter (Signed)
-----   Message from Kennieth Rad, PA-C sent at 01/16/2022 11:51 AM EST ----- ?Please call patient and let her know that her cytology report did show cells consistent with a benign follicular nodule.  This is not cancerous, she should work with her primary care provider to determine appropriate follow-up but generally she will have repeat ultrasound every 6 months for a period of time to make sure nodule is not growing in size. ?

## 2022-01-26 ENCOUNTER — Other Ambulatory Visit: Payer: Self-pay | Admitting: Internal Medicine

## 2022-01-26 NOTE — Telephone Encounter (Signed)
Copied from [place] [id number]. Topic: Quick Communication - Rx Refill/Question ?>> Jan 26, 2022 11:42 AM Leward Quan A wrote: ?Medication: amLODipine (NORVASC) 5 MG tablet  ? ?Has the patient contacted their pharmacy? Yes.   ?(Agent: If no, request that the patient contact the pharmacy for the refill. If patient does not wish to contact the pharmacy document the reason why and proceed with request.) ?(Agent: If yes, when and what did the pharmacy advise?) ? ?Preferred Pharmacy (with phone number or street name): [facility] ([facility] [place] [address]  ?Phone:  [phone] ?Fax:  [phone] ? ? ? ?Has the patient been seen for an appointment in the last year OR does the patient have an upcoming appointment? Yes.   ? ?Agent: Please be advised that RX refills may take up to 3 business days. We ask that you follow-up with your pharmacy. ?

## 2022-01-27 ENCOUNTER — Other Ambulatory Visit: Payer: Self-pay

## 2022-01-27 ENCOUNTER — Encounter: Payer: Self-pay | Admitting: Pulmonary Disease

## 2022-01-27 ENCOUNTER — Ambulatory Visit (INDEPENDENT_AMBULATORY_CARE_PROVIDER_SITE_OTHER): Payer: PRIVATE HEALTH INSURANCE | Admitting: Pulmonary Disease

## 2022-01-27 VITALS — BP 140/90 | HR 88 | Ht 63.5 in | Wt 221.0 lb

## 2022-01-27 DIAGNOSIS — J4550 Severe persistent asthma, uncomplicated: Secondary | ICD-10-CM | POA: Diagnosis not present

## 2022-01-27 DIAGNOSIS — R053 Chronic cough: Secondary | ICD-10-CM | POA: Diagnosis not present

## 2022-01-27 MED ORDER — IPRATROPIUM BROMIDE 0.06 % NA SOLN: NASAL | 5 refills | Status: DC

## 2022-01-27 MED ORDER — PREDNISONE 10 MG PO TABS: 20.0000 mg | ORAL_TABLET | Freq: Every day | ORAL | 0 refills | Status: AC

## 2022-01-27 MED ORDER — SPIRIVA RESPIMAT 1.25 MCG/ACT IN AERS: 2.0000 | INHALATION_SPRAY | Freq: Every day | RESPIRATORY_TRACT | 0 refills | Status: DC

## 2022-01-27 NOTE — Progress Notes (Signed)
? ? ?Patient ID: [name], female    DOB: [birth year]  MRN: [id number] ? ?CC: Hypertension Follow-Up ? ?Subjective: ?[name] is a 51 y.o. female who presents for hypertension follow-up.  ? ?Her concerns today include:  ?HYPERTENSION FOLLOW-UP: ?01/04/2022 with Carrolyn Meiers, PA: ?New onset hypertension diagnosis.  Patient encouraged to continue amlodipine at this time, check blood pressure at home on a daily basis, keep a written log and have available for all office visits. ?Red flags given for prompt reevaluation.  ? ?02/01/2022: ?Not checking blood pressures routinely. However, when was checking blood pressures noticed it was going down. Having daily headaches lasting 30 seconds daily for years. Taking Excedrin Migraine helps mildly sometimes. Was prescribed medication in the past that did not help. Denies chest pain, shortness of breath and additional red flag symptoms. ?Med Adherence: '[x]'$  Yes    '[]'$  No ?Medication side effects: '[]'$  Yes    '[x]'$  No ?Adherence with salt restriction (low-salt diet): '[x]'$  Yes  '[]'$  No ?Exercise: Yes '[]'$  No '[x]'$  ?Home Monitoring?: '[]'$  Yes    '[x]'$  No ?Monitoring Frequency: '[]'$  Yes    '[x]'$  No ?Home BP results range: '[]'$  Yes    '[x]'$  No ?SOB? '[]'$  Yes    '[x]'$  No ?Chest Pain?: '[]'$  Yes    '[x]'$  No ? ?2. ACID REFLUX: ?Requesting refills of Omeprazole. No issues/concerns.  ? ?Patient Active Problem List  ? Diagnosis Date Noted  ? Chronic cough 01/27/2022  ? Right thyroid nodule 01/07/2022  ? Essential hypertension 01/05/2022  ? Migraine 01/05/2022  ? Thyroid nodule greater than or equal to 1.5 cm in diameter incidentally noted on imaging study 12/01/2021  ? Grief 02/06/2021  ? Non-intractable vomiting 02/06/2021  ? Arthritis of right knee   ? S/P knee replacement 01/06/2021  ? Morning headache 06/02/2017  ? Leiomyoma of body of uterus 12/24/2016  ? Prediabetes 11/11/2016  ? Pap smear abnormality of cervix with ASCUS favoring benign 01/01/2016  ? COPD (chronic obstructive pulmonary disease) ([place]) 11/18/2015   ? GERD (gastroesophageal reflux disease) 11/18/2015  ? Abnormal imaging of thyroid 11/04/2015  ? COPD exacerbation ([place]) 11/01/2015  ? Elevated d-dimer 11/01/2015  ? IDA (iron deficiency anemia) 09/04/2015  ? Patellofemoral disorder 04/04/2015  ? Insomnia 04/09/2014  ? S/P cervical spinal fusion 03/06/2014  ? Class 2 severe obesity due to excess calories with serious comorbidity and body mass index (BMI) of 37.0 to 37.9 in adult [facility]) 02/22/2014  ? Radicular pain in right arm 01/11/2014  ? 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 Other and unspecified hyperlipidemia 08/24/2013  ? History of CVA (cerebrovascular accident) 05/18/2013  ? 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  ? ?Current Outpatient Medications on File Prior to Visit  ?Medication Sig Dispense Refill  ? albuterol (PROVENTIL) (2.5 MG/3ML) 0.083% nebulizer solution Take 3 mLs by nebulization every 6 (six) hours as needed for wheezing or shortness of breath. 180 mL 1  ? albuterol (VENTOLIN HFA) 108 (90 Base) MCG/ACT inhaler Inhale 2 puffs into the lungs every 6 (six) hours as needed for wheezing or shortness of breath. 8 g 5  ? aspirin 81 MG chewable tablet Chew 1 tablet (81 mg total) by mouth 2 (two) times daily. 60 tablet 0  ? escitalopram (LEXAPRO) 10 MG tablet Take 1 tablet (10 mg total) by mouth at bedtime. 30 tablet 0  ? ferrous sulfate 325 (65 FE) MG tablet TAKE 1 TABLET (325 MG TOTAL) BY MOUTH DAILY WITH BREAKFAST. (Patient not taking: Reported  on 09/22/2021) 90 tablet 0  ? ferrous sulfate 325 (65 FE) MG tablet Take 1 tablet (325 mg total) by mouth every other day. 90 tablet 0  ? ferumoxytol (FERAHEME) 510 MG/17ML SOLN injection Inject 17 mLs (510 mg total) into the vein once for 1 dose. (Patient not taking: Reported on 02/05/2021) 17 mL 0  ? ferumoxytol (FERAHEME) 510 MG/17ML SOLN injection INJECT 17 MLS (510 MG TOTAL) INTO THE VEIN ONCE FOR 1 DOSE. (Patient not taking: Reported on 09/22/2021) 17 mL 0  ? fluticasone-salmeterol (ADVAIR HFA)  230-21 MCG/ACT inhaler Inhale 2 puffs into the lungs 2 (two) times daily. 1 each 5  ? Fluticasone-Salmeterol 232-14 MCG/ACT AEPB Inhale 1 puff into the lungs in the morning and at bedtime. 1 each 5  ? ipratropium (ATROVENT) 0.06 % nasal spray PLACE 2 SPRAYS INTO BOTH NOSTRILS 4 TIMES DAILY. 15 mL 5  ? predniSONE (DELTASONE) 10 MG tablet Take 2 tablets (20 mg total) by mouth daily with breakfast for 5 days. 20 tablet 0  ? SUMAtriptan (IMITREX) 50 MG tablet Take 1 tablet (50 mg total) by mouth every 2 (two) hours as needed for migraine. May repeat in 2 hours if headache persists or recurs. 10 tablet 0  ? Tiotropium Bromide Monohydrate (SPIRIVA RESPIMAT) 1.25 MCG/ACT AERS Inhale 2 puffs into the lungs daily. 4 g 0  ? ?No current facility-administered medications on file prior to visit.  ? ? ?Allergies  ?Allergen Reactions  ? Shellfish Allergy Itching and Swelling  ? Amoxicillin Other (See Comments)  ?  Yeast infection  ? Latex Itching and Rash  ? ? ?Social History  ? ?Socioeconomic History  ? Marital status: Divorced  ?  Spouse name: Not on file  ? Number of children: 7  ? Years of education: GEd  ? Highest education level: Not on file  ?Occupational History  ? Occupation: CNA  ?  Employer: [employer]  ?Tobacco Use  ? Smoking status: Former  ?  Packs/day: 0.50  ?  Years: 26.00  ?  Pack years: 13.00  ?  Types: Cigarettes  ?  Quit date: 10/09/2015  ?  Years since quitting: 6.3  ? Smokeless tobacco: Former  ?Vaping Use  ? Vaping Use: Never used  ?Substance and Sexual Activity  ? Alcohol use: Not Currently  ?  Alcohol/week: 0.0 standard drinks  ?  Comment: occ  ? Drug use: No  ? Sexual activity: Not Currently  ?  Birth control/protection: Surgical  ?Other Topics Concern  ? Not on file  ?Social History Narrative  ? Patient lives at home with her family   ? She has 7 children  ? 5 grown and out of the house  ? 2 at home 11 and 14  ? She works as a Quarry manager at [employer]   ? ?Social Determinants of Health  ? ?Financial  Resource Strain: Not on file  ?Food Insecurity: Not on file  ?Transportation Needs: Not on file  ?Physical Activity: Not on file  ?Stress: Not on file  ?Social Connections: Not on file  ?Intimate Partner Violence: Not on file  ? ? ?Family History  ?Problem Relation Age of Onset  ? Renal Disease Father   ?     dialysis  ? Hypertension Father   ? Heart disease Mother   ? Heart disease Maternal Grandfather   ? Asthma Sister   ? Asthma Maternal Aunt   ? ? ?Past Surgical History:  ?Procedure Laterality Date  ? ANTERIOR CERVICAL DECOMP/DISCECTOMY  FUSION N/A 03/06/2014  ? Procedure: ANTERIOR CERVICAL DECOMPRESSION/DISCECTOMY FUSION 2 LEVELS four/five, five/six;  Surgeon: Eustace Moore, MD;  Location[facility] NEURO ORS;  Service: Neurosurgery;  Laterality: N/A;  ? CESAREAN SECTION    ? x 1 with 5th pregnancy  ? CHOLECYSTECTOMY    ? KNEE ARTHROSCOPY Right 2015  ? TEE WITHOUT CARDIOVERSION N/A 05/21/2013  ? Procedure: TRANSESOPHAGEAL ECHOCARDIOGRAM (TEE);  Surgeon: Sueanne Margarita, MD;  Location: [facility];  Service: Cardiovascular;  Laterality: N/A;  ? TOTAL KNEE ARTHROPLASTY Right 01/06/2021  ? TOTAL KNEE ARTHROPLASTY Right 01/06/2021  ? Procedure: RIGHT TOTAL KNEE ARTHROPLASTY;  Surgeon: Meredith Pel, MD;  Location: [facility];  Service: Orthopedics;  Laterality: Right;  ? TUBAL LIGATION    ? ? ?ROS: ?Review of Systems ?Negative except as stated above ? ?PHYSICAL EXAM: ?BP 121/82 (BP Location: Left Arm, Patient Position: Sitting, Cuff Size: Large)   Pulse (!) 103   Temp 98.3 ?F (36.8 ?C)   Resp 18   Ht 5' 3.5" (1.613 m)   Wt 219 lb (99.3 kg)   SpO2 93%   BMI 38.18 kg/m?  ? ?Physical Exam ?HENT:  ?   Head: Normocephalic and atraumatic.  ?Eyes:  ?   Extraocular Movements: Extraocular movements intact.  ?   Conjunctiva/sclera: Conjunctivae normal.  ?   Pupils: Pupils are equal, round, and reactive to light.  ?Cardiovascular:  ?   Rate and Rhythm: Tachycardia present.  ?   Pulses: Normal pulses.  ?   Heart sounds: Normal  heart sounds.  ?Pulmonary:  ?   Effort: Pulmonary effort is normal.  ?   Breath sounds: Normal breath sounds.  ?Musculoskeletal:  ?   Cervical back: Normal range of motion and neck supple.  ?Neurological:  ?   [place]

## 2022-01-27 NOTE — Patient Instructions (Signed)
?  Chronic bronchitis ?Mild restrictive defect ?--Prednisone ordered (20 mg daily x 5 days) ?--CONTINUE Advair 230-21 mcg TWO puffs TWICE a day ?--GIVE sample of Spiriva 1.25 mcg TWO puffs ONCE a day. ?--CONTINUE Albuterol every 4 hours AS NEEDED ?--CONTINUE Albuterol nebulizer q6h AS NEEDED ? ?Chronic cough - May be post-viral ?--CONTINUE omeprazole 20 mg twice a day ?--START atrovent nasal spray twice a day ?--Amb ENT referral ? ?Follow-up with me in June 2023 ?

## 2022-01-27 NOTE — Progress Notes (Signed)
? ? ?Subjective:  ? ?PATIENT ID: [name] GENDER: female DOB: 1971-10-17, MRN: [id number] ? ? ?HPI ? ?Chief Complaint  ?Patient presents with  ? Follow-up  ?  Persistent cough  ? ? ?Reason for Visit: Follow-up  ? ?[name] is a 51 year old female former smoker with hx TIA and chronic headaches who presents for follow-up ? ?Initial consult ?She reports long standing of respiratory issues including frequent pneumonias as a child and once during her pregnancy at age 42. She has had worsening unproductive cough in the last two years with 3-5 urgent care/ED visits in that timeframe. She reports upper airway congestion and wheezing. Sometimes has post-tussive emesis. Cough occurs all day but seems to occur more at night. ? ?ED note from 06/30/21 by NP Vickki Muff reviewed. She presented with productive cough, nasal congestion and wheezing x 1 week. Treated with Augmentin, prednisone and benzonatate. Advised to be seen by Pulmonary for COPD exacerbation follow-up. She has been on AirDuo Respiclick that she is intermittently is compliant. After she finished her steroids, she is having recurrent wheezing and cough. Reports reflux on omeprazole ? ?11/25/21 ?Since our last visit she has an asthma/bronchitis flare up in December. She recovered since then but in the last week she has been having shortness of breath and productive cough but difficult to bring up sputum. Has been mucinex for the last two weeks. Intermittent wheezing. Denies fevers, chills. Neg home COVID-19 test five days ago. ? ?01/27/2022 ?Since her last visit she had as asthma/bronchitis flare requiring prednisone and azithromycin in January and February 2023.  Currently she is on high-dose Advair HFA. She has a persistent cough. Mucinex has improved the congestion. Cough is dry and making her throat irritated. Some wheezing. She has cycles of illness every 3-4 weeks that are relieved with prednisone and azithromycin. Only has shortness of breath coughing  episodes.  ? ?Social History: ?Quit 5 years ago. Started 51 years old. 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 ?Environmental exposures: TB exposure when she was 12 years s/p treatment 1 year for +PPD. ? ?Past Medical History:  ?Diagnosis Date  ? Anemia   ? Anxiety   ? Arthritis   ? right knee, back  ? COPD (chronic obstructive pulmonary disease) ([place])   ? GERD (gastroesophageal reflux disease)   ? Headache(784.0)   ? History of blood transfusion   ? Hx; of in 1991 after delivery  ? History of TIAs   ? Numbness   ? Right hand  ? Pneumonia   ? Stroke [facility]) 05/2013  ?  ? ?Family History  ?Problem Relation Age of Onset  ? Renal Disease Father   ?     dialysis  ? Hypertension Father   ? Heart disease Mother   ? Heart disease Maternal Grandfather   ? Asthma Sister   ? Asthma Maternal Aunt   ?  ? ?Social History  ? ?Occupational History  ? Occupation: CNA  ?  Employer: [employer]  ?Tobacco Use  ? Smoking status: Former  ?  Packs/day: 0.50  ?  Years: 26.00  ?  Pack years: 13.00  ?  Types: Cigarettes  ?  Quit date: 10/09/2015  ?  Years since quitting: 6.3  ? Smokeless tobacco: Former  ?Vaping Use  ? Vaping Use: Never used  ?Substance and Sexual Activity  ? Alcohol use: Not Currently  ?  Alcohol/week: 0.0 standard drinks  ?  Comment: occ  ? Drug use: No  ?  Sexual activity: Not Currently  ?  Birth control/protection: Surgical  ? ? ?Allergies  ?Allergen Reactions  ? Shellfish Allergy Itching and Swelling  ? Amoxicillin Other (See Comments)  ?  Yeast infection  ? Latex Itching and Rash  ?  ? ?Outpatient Medications Prior to Visit  ?Medication Sig Dispense Refill  ? albuterol (VENTOLIN HFA) 108 (90 Base) MCG/ACT inhaler Inhale 2 puffs into the lungs every 6 (six) hours as needed for wheezing or shortness of breath. 8 g 5  ? amLODipine (NORVASC) 5 MG tablet Take 1 tablet (5 mg total) by mouth daily. 30 tablet 0  ? aspirin 81 MG chewable tablet Chew 1 tablet (81 mg total) by mouth 2 (two) times daily. 60 tablet 0  ? ferrous sulfate 325 (65  FE) MG tablet Take 1 tablet (325 mg total) by mouth every other day. 90 tablet 0  ? fluticasone-salmeterol (ADVAIR HFA) 230-21 MCG/ACT inhaler Inhale 2 puffs into the lungs 2 (two) times daily. 1 each 5  ? Fluticasone-Salmeterol 232-14 MCG/ACT AEPB Inhale 1 puff into the lungs in the morning and at bedtime. 1 each 5  ? omeprazole (PRILOSEC) 20 MG capsule Take 20 mg by mouth daily.    ? SUMAtriptan (IMITREX) 50 MG tablet Take 1 tablet (50 mg total) by mouth every 2 (two) hours as needed for migraine. May repeat in 2 hours if headache persists or recurs. 10 tablet 0  ? azithromycin (ZITHROMAX) 250 MG tablet Take as instructed. 6 tablet 0  ? albuterol (PROVENTIL) (2.5 MG/3ML) 0.083% nebulizer solution Take 3 mLs by nebulization every 6 (six) hours as needed for wheezing or shortness of breath. 180 mL 1  ? escitalopram (LEXAPRO) 10 MG tablet Take 1 tablet (10 mg total) by mouth at bedtime. 30 tablet 0  ? ferrous sulfate 325 (65 FE) MG tablet TAKE 1 TABLET (325 MG TOTAL) BY MOUTH DAILY WITH BREAKFAST. (Patient not taking: Reported on 09/22/2021) 90 tablet 0  ? ferumoxytol (FERAHEME) 510 MG/17ML SOLN injection Inject 17 mLs (510 mg total) into the vein once for 1 dose. (Patient not taking: Reported on 02/05/2021) 17 mL 0  ? ferumoxytol (FERAHEME) 510 MG/17ML SOLN injection INJECT 17 MLS (510 MG TOTAL) INTO THE VEIN ONCE FOR 1 DOSE. (Patient not taking: Reported on 09/22/2021) 17 mL 0  ? ipratropium (ATROVENT) 0.06 % nasal spray PLACE 2 SPRAYS INTO BOTH NOSTRILS 4 TIMES DAILY. (Patient not taking: Reported on 11/25/2021) 15 mL 12  ? ?No facility-administered medications prior to visit.  ? ? ?Review of Systems  ?Constitutional:  Negative for chills, diaphoresis, fever, malaise/fatigue and weight loss.  ?HENT:  Negative for congestion.   ?Respiratory:  Positive for cough, shortness of breath and wheezing. Negative for hemoptysis and sputum production.   ?Cardiovascular:  Negative for chest pain, palpitations and leg swelling.   ? ? ?Objective:  ? ?Vitals:  ? 01/27/22 0911  ?BP: 140/90  ?Pulse: 88  ?SpO2: 96%  ?Weight: 221 lb (100.2 kg)  ?Height: 5' 3.5" (1.613 m)  ? ?SpO2: 96 % ?O2 Device: None (Room air) ? ?Physical Exam: ?General: Well-appearing, no acute distress ?HENT: [place], AT ?Eyes: EOMI, no scleral icterus ?Respiratory: Clear to auscultation bilaterally.  No crackles, wheezing or rales ?Cardiovascular: RRR, -M/R/G, no JVD ?Extremities:-Edema,-tenderness ?Neuro: AAO x4, CNII-XII grossly intact ?Psych: Normal mood, normal affect ? ?Data Reviewed: ? ?Imaging: ?CTA 11/01/15 - No pulmonary embolism. Bilateral patchy opacities. Right hilar node measuring 1.4 cm ?CXR 09/22/21 - Left infrahilar atelectasis. Cervical hardware ?CT Chest  11/11/21 - Resolution of right hilar adenopathy. Bibasilar scarring. Increased right thyroid lobe size.  ? ?PFT: ?11/25/21 ?FVC 1.98 (69%) FEV1 1.7 (74%) Ratio 86  TLC 71% DLCO 73% ?Interpretation: Mild restrictive defect with mildly reduced gas exchange. No significant bronchodilator response. F-V loops with minimal airway disease. ? ?Labs: ?CBC ?   ?Component Value Date/Time  ? WBC 7.9 01/02/2022 0435  ? RBC 5.34 (H) 01/02/2022 0435  ? HGB 12.6 01/02/2022 0435  ? HGB 12.2 02/09/2021 1515  ? HGB 12.4 12/15/2020 1137  ? HCT 40.5 01/02/2022 0435  ? HCT 40.5 12/15/2020 1137  ? PLT 310 01/02/2022 0435  ? PLT 248 02/09/2021 1515  ? PLT 308 12/15/2020 1137  ? MCV 75.8 (L) 01/02/2022 0435  ? MCV 75 (L) 12/15/2020 1137  ? MCH 23.6 (L) 01/02/2022 0435  ? MCHC 31.1 01/02/2022 0435  ? RDW 17.6 (H) 01/02/2022 0435  ? RDW 21.7 (H) 11/17/2020 1426  ? LYMPHSABS 2.4 01/02/2022 0435  ? LYMPHSABS 2.4 12/15/2020 1137  ? MONOABS 0.3 01/02/2022 0435  ? EOSABS 0.0 01/02/2022 0435  ? EOSABS 0.0 12/15/2020 1137  ? BASOSABS 0.0 01/02/2022 0435  ? BASOSABS 0.0 12/15/2020 1137  ? ?Absolute eos 02/09/21 - 0 ? ?   ?Assessment & Plan:  ? ?Discussion: ?51 year old female former smoker with restrictive lung defect, history TIA and chronic  headaches who presents for follow-up.  Initially seen by me for right hilar mass which has self resolved, suspect inflammatory/reactive.  Has required multiple courses of prednisone and antibiotics for acute

## 2022-01-27 NOTE — Telephone Encounter (Signed)
Requested medications are due for refill today.  yes ? ?Requested medications are on the active medications list.  yes ? ?Last refill. 01/02/2022 #30 0 ? ?Future visit scheduled.   yes ? ?Notes to clinic.  Prescription signed by Dr. Laverta Baltimore - PCP listed as Dr. Johnnye Sima ? ? ? ?Requested Prescriptions  ?Pending Prescriptions Disp Refills  ? amLODipine (NORVASC) 5 MG tablet 30 tablet 0  ?  Sig: Take 1 tablet (5 mg total) by mouth daily.  ?  ? Cardiovascular: Calcium Channel Blockers 2 Failed - 01/26/2022  1:15 PM  ?  ?  Failed - Last BP in normal range  ?  BP Readings from Last 1 Encounters:  ?01/27/22 140/90  ?  ?  ?  ?  Passed - Last Heart Rate in normal range  ?  Pulse Readings from Last 1 Encounters:  ?01/27/22 88  ?  ?  ?  ?  Passed - Valid encounter within last 6 months  ?  Recent Outpatient Visits   ? ?      ? 3 weeks ago Essential hypertension  ? [facility] at [place], PA-C  ? 8 months ago Chronic obstructive pulmonary disease, unspecified COPD type ([place])  ? Primary Care at [facility] [place], NP  ? 1 year ago Hospital discharge follow-up  ? Primary Care at [facility], [place] Beaver, MD  ? 1 year ago Iron deficiency anemia, unspecified iron deficiency anemia type  ? Primary Care at [facility], [place]ard Beaver, MD  ? 1 year ago Hemoglobin low  ? Primary Care at [facility] [place], NP  ? ?  ?  ?Future Appointments   ? ?        ? In 5 days Camillia Herter, NP Primary Care at [facility]  ? In 3 months Margaretha Seeds, MD [facility]  ? ?  ? ?  ?  ?  ?  ?

## 2022-01-29 ENCOUNTER — Telehealth: Payer: Self-pay

## 2022-01-29 DIAGNOSIS — I10 Essential (primary) hypertension: Secondary | ICD-10-CM

## 2022-01-29 NOTE — Telephone Encounter (Signed)
Copied from [facility] [phone]. Topic: General - Other ?>> Jan 29, 2022 12:23 PM Tessa Lerner A wrote: ?Reason for CRM: The patient would like to speak with a member of staff when possible ? ?The patient has additional concerns with the refill of their amLODipine (NORVASC) 5 MG tablet [[id number]]  ? ?The patient currently has two tablets remaining and would like to continue taking the medication without delay  ? ?Please contact further to discuss the refill ?

## 2022-02-01 ENCOUNTER — Other Ambulatory Visit: Payer: Self-pay

## 2022-02-01 ENCOUNTER — Ambulatory Visit (INDEPENDENT_AMBULATORY_CARE_PROVIDER_SITE_OTHER): Payer: PRIVATE HEALTH INSURANCE | Admitting: Family

## 2022-02-01 VITALS — BP 121/82 | HR 103 | Temp 98.3°F | Resp 18 | Ht 63.5 in | Wt 219.0 lb

## 2022-02-01 DIAGNOSIS — G8929 Other chronic pain: Secondary | ICD-10-CM

## 2022-02-01 DIAGNOSIS — E041 Nontoxic single thyroid nodule: Secondary | ICD-10-CM | POA: Diagnosis not present

## 2022-02-01 DIAGNOSIS — R519 Headache, unspecified: Secondary | ICD-10-CM

## 2022-02-01 DIAGNOSIS — I10 Essential (primary) hypertension: Secondary | ICD-10-CM

## 2022-02-01 DIAGNOSIS — K219 Gastro-esophageal reflux disease without esophagitis: Secondary | ICD-10-CM | POA: Diagnosis not present

## 2022-02-01 MED ORDER — AMLODIPINE BESYLATE 5 MG PO TABS: 5.0000 mg | ORAL_TABLET | Freq: Every day | ORAL | 0 refills | Status: DC

## 2022-02-01 MED ORDER — OMEPRAZOLE 40 MG PO CPDR: 40.0000 mg | DELAYED_RELEASE_CAPSULE | Freq: Every day | ORAL | 0 refills | Status: DC

## 2022-02-01 NOTE — Progress Notes (Signed)
Pt presents for hypertension follow-up needs refill on amlodipine medication for acid reflux  ?

## 2022-02-01 NOTE — Patient Instructions (Signed)
Amlodipine Tablets ?What is this medication? ?AMLODIPINE (am LOE di peen) treats high blood pressure and prevents chest pain (angina). It works by relaxing the blood vessels, which helps decrease the amount of work your heart has to do. It belongs to a group of medications called calcium channel blockers. ?This medicine may be used for other purposes; ask your health care provider or pharmacist if you have questions. ?COMMON BRAND NAME(S): Norvasc ?What should I tell my care team before I take this medication? ?They need to know if you have any of these conditions: ?Heart disease ?Liver disease ?An unusual or allergic reaction to amlodipine, other medications, foods, dyes, or preservatives ?Pregnant or trying to get pregnant ?Breast-feeding ?How should I use this medication? ?Take this medication by mouth. Take it as directed on the prescription label at the same time every day. You can take it with or without food. If it upsets your stomach, take it with food. Keep taking it unless your care team tells you to stop. ?Talk to your care team about the use of this medication in children. While it may be prescribed for children as young as 6 for selected conditions, precautions do apply. ?Overdosage: If you think you have taken too much of this medicine contact a poison control center or emergency room at once. ?NOTE: This medicine is only for you. Do not share this medicine with others. ?What if I miss a dose? ?If you miss a dose, take it as soon as you can. If it is almost time for your next dose, take only that dose. Do not take double or extra doses. ?What may interact with this medication? ?Clarithromycin ?Cyclosporine ?Diltiazem ?Itraconazole ?Simvastatin ?Tacrolimus ?This list may not describe all possible interactions. Give your health care provider a list of all the medicines, herbs, non-prescription drugs, or dietary supplements you use. Also tell them if you smoke, drink alcohol, or use illegal drugs. Some  items may interact with your medicine. ?What should I watch for while using this medication? ?Visit your health care provider for regular checks on your progress. Check your blood pressure as directed. Ask your health care provider what your blood pressure should be. Also, find out when you should contact him or her. ?Do not treat yourself for coughs, colds, or pain while you are using this medication without asking your health care provider for advice. Some medications may increase your blood pressure. ?You may get drowsy or dizzy. Do not drive, use machinery, or do anything that needs mental alertness until you know how this medication affects you. Do not stand up or sit up quickly, especially if you are an older patient. This reduces the risk of dizzy or fainting spells. Alcohol can make you more drowsy and dizzy. Avoid alcoholic drinks. ?What side effects may I notice from receiving this medication? ?Side effects that you should report to your care team as soon as possible: ?Allergic reactions--skin rash, itching, hives, swelling of the face, lips, tongue, or throat ?Heart attack--pain or tightness in the chest, shoulders, arms, or jaw, nausea, shortness of breath, cold or clammy skin, feeling faint or lightheaded ?Low blood pressure--dizziness, feeling faint or lightheaded, blurry vision ?Side effects that usually do not require medical attention (report these to your care team if they continue or are bothersome): ?Facial flushing, redness ?Heart palpitations--rapid, pounding, or irregular heartbeat ?Nausea ?Stomach pain ?Swelling of the ankles, hands, or feet ?This list may not describe all possible side effects. Call your doctor for medical advice about side  effects. You may report side effects to FDA at 1-800-FDA-1088. ?Where should I keep my medication? ?Keep out of the reach of children and pets. ?Store at room temperature between 20 and 25 degrees C (68 and 77 degrees F). Protect from light and moisture.  Keep the container tightly closed. Get rid of any unused medication after the expiration date. ?To get rid of medications that are no longer needed or have expired: ?Take the medication to a medication take-back program. Check with your pharmacy or law enforcement to find a location. ?If you cannot return the medication, check the label or package insert to see if the medication should be thrown out in the garbage or flushed down the toilet. If you are not sure, ask your health care provider. If it is safe to put in the trash, empty the medication out of the container. Mix the medication with cat litter, dirt, coffee grounds, or other unwanted substance. Seal the mixture in a bag or container. Put it in the trash. ?NOTE: This sheet is a summary. It may not cover all possible information. If you have questions about this medicine, talk to your doctor, pharmacist, or health care provider. ?? 2022 Elsevier/Gold Standard (2021-07-14 00:00:00) ? ?

## 2022-02-01 NOTE — Telephone Encounter (Signed)
Ordered by provider 02/01/22. ?

## 2022-02-04 ENCOUNTER — Encounter: Payer: Self-pay | Admitting: Neurology

## 2022-03-07 ENCOUNTER — Other Ambulatory Visit: Payer: Self-pay | Admitting: Pulmonary Disease

## 2022-03-07 ENCOUNTER — Other Ambulatory Visit: Payer: Self-pay | Admitting: Family

## 2022-03-07 ENCOUNTER — Encounter: Payer: Self-pay | Admitting: Emergency Medicine

## 2022-03-07 ENCOUNTER — Other Ambulatory Visit: Payer: Self-pay

## 2022-03-07 ENCOUNTER — Ambulatory Visit
Admission: EM | Admit: 2022-03-07 | Discharge: 2022-03-07 | Disposition: A | Discharge status: Home / Self Care | Payer: PRIVATE HEALTH INSURANCE | Attending: Urgent Care | Admitting: Urgent Care

## 2022-03-07 DIAGNOSIS — R0789 Other chest pain: Secondary | ICD-10-CM

## 2022-03-07 DIAGNOSIS — Z9889 Other specified postprocedural states: Secondary | ICD-10-CM | POA: Diagnosis not present

## 2022-03-07 DIAGNOSIS — I10 Essential (primary) hypertension: Secondary | ICD-10-CM

## 2022-03-07 DIAGNOSIS — M79602 Pain in left arm: Secondary | ICD-10-CM

## 2022-03-07 DIAGNOSIS — K219 Gastro-esophageal reflux disease without esophagitis: Secondary | ICD-10-CM

## 2022-03-07 DIAGNOSIS — M5010 Cervical disc disorder with radiculopathy, unspecified cervical region: Secondary | ICD-10-CM | POA: Diagnosis not present

## 2022-03-07 DIAGNOSIS — I160 Hypertensive urgency: Secondary | ICD-10-CM | POA: Diagnosis not present

## 2022-03-07 DIAGNOSIS — M542 Cervicalgia: Secondary | ICD-10-CM

## 2022-03-07 MED ORDER — TRAMADOL HCL 50 MG PO TABS
50.0000 mg | ORAL_TABLET | Freq: Four times a day (QID) | ORAL | 0 refills | Status: DC | PRN
Start: 2022-03-07 — End: 2022-03-24

## 2022-03-07 MED ORDER — TIZANIDINE HCL 4 MG PO TABS: 4.0000 mg | ORAL_TABLET | Freq: Every day | ORAL | 0 refills | Status: DC

## 2022-03-07 MED ORDER — ACETAMINOPHEN 325 MG PO TABS: 650.0000 mg | ORAL_TABLET | Freq: Four times a day (QID) | ORAL | 0 refills | Status: DC | PRN

## 2022-03-07 NOTE — ED Triage Notes (Signed)
Pt here for neck pain into left shoulder and upper back area x 10 days; denies obvious injury  ?

## 2022-03-07 NOTE — ED Provider Notes (Signed)
?[place] ? ? ?MRN: [id number] DOB: 07/27/1971 ? ?Subjective:  ? ?[name] is a 51 y.o. female presenting for 10-day history of acute onset persistent neck pain on the left side that radiates up to the occiput and also intermittently to the left arm.  Has a history of spinal surgery, cervical spinal fusion done several years ago.  No recent fall, trauma, weakness, numbness or tingling.  No vision changes.  She has had intermittent left-sided chest pains in the past day.  No history of heart attack but she does have a history of stroke.  She does have a history of essential hypertension but has not taken her blood pressure medications now.  She has amlodipine and dose of 5 mg that she is supposed to take but decided to stop taking them because her pressure was too low. ? ?No current facility-administered medications for this encounter. ? ?Current Outpatient Medications:  ?  albuterol (PROVENTIL) (2.5 MG/3ML) 0.083% nebulizer solution, Take 3 mLs by nebulization every 6 (six) hours as needed for wheezing or shortness of breath., Disp: 180 mL, Rfl: 1 ?  albuterol (VENTOLIN HFA) 108 (90 Base) MCG/ACT inhaler, Inhale 2 puffs into the lungs every 6 (six) hours as needed for wheezing or shortness of breath., Disp: 8 g, Rfl: 5 ?  amLODipine (NORVASC) 5 MG tablet, Take 1 tablet (5 mg total) by mouth daily., Disp: 120 tablet, Rfl: 0 ?  aspirin 81 MG chewable tablet, Chew 1 tablet (81 mg total) by mouth 2 (two) times daily., Disp: 60 tablet, Rfl: 0 ?  escitalopram (LEXAPRO) 10 MG tablet, Take 1 tablet (10 mg total) by mouth at bedtime., Disp: 30 tablet, Rfl: 0 ?  ferrous sulfate 325 (65 FE) MG tablet, TAKE 1 TABLET (325 MG TOTAL) BY MOUTH DAILY WITH BREAKFAST. (Patient not taking: Reported on 09/22/2021), Disp: 90 tablet, Rfl: 0 ?  ferrous sulfate 325 (65 FE) MG tablet, Take 1 tablet (325 mg total) by mouth every other day., Disp: 90 tablet, Rfl: 0 ?  ferumoxytol (FERAHEME) 510 MG/17ML SOLN injection,  Inject 17 mLs (510 mg total) into the vein once for 1 dose. (Patient not taking: Reported on 02/05/2021), Disp: 17 mL, Rfl: 0 ?  ferumoxytol (FERAHEME) 510 MG/17ML SOLN injection, INJECT 17 MLS (510 MG TOTAL) INTO THE VEIN ONCE FOR 1 DOSE. (Patient not taking: Reported on 09/22/2021), Disp: 17 mL, Rfl: 0 ?  fluticasone-salmeterol (ADVAIR HFA) 230-21 MCG/ACT inhaler, Inhale 2 puffs into the lungs 2 (two) times daily., Disp: 1 each, Rfl: 5 ?  Fluticasone-Salmeterol 232-14 MCG/ACT AEPB, Inhale 1 puff into the lungs in the morning and at bedtime., Disp: 1 each, Rfl: 5 ?  ipratropium (ATROVENT) 0.06 % nasal spray, PLACE 2 SPRAYS INTO BOTH NOSTRILS 4 TIMES DAILY., Disp: 15 mL, Rfl: 5 ?  omeprazole (PRILOSEC) 40 MG capsule, Take 1 capsule (40 mg total) by mouth daily., Disp: 120 capsule, Rfl: 0 ?  SUMAtriptan (IMITREX) 50 MG tablet, Take 1 tablet (50 mg total) by mouth every 2 (two) hours as needed for migraine. May repeat in 2 hours if headache persists or recurs., Disp: 10 tablet, Rfl: 0 ?  Tiotropium Bromide Monohydrate (SPIRIVA RESPIMAT) 1.25 MCG/ACT AERS, Inhale 2 puffs into the lungs daily., Disp: 4 g, Rfl: 0  ? ?Allergies  ?Allergen Reactions  ? Shellfish Allergy Itching and Swelling  ? Amoxicillin Other (See Comments)  ?  Yeast infection  ? Latex Itching and Rash  ? ? ?Past Medical History:  ?Diagnosis Date  ?  Anemia   ? Anxiety   ? Arthritis   ? right knee, back  ? COPD (chronic obstructive pulmonary disease) ([place])   ? GERD (gastroesophageal reflux disease)   ? Headache(784.0)   ? History of blood transfusion   ? Hx; of in 1991 after delivery  ? History of TIAs   ? Numbness   ? Right hand  ? Pneumonia   ? Stroke [facility]) 05/2013  ?  ? ?Past Surgical History:  ?Procedure Laterality Date  ? ANTERIOR CERVICAL DECOMP/DISCECTOMY FUSION N/A 03/06/2014  ? Procedure: ANTERIOR CERVICAL DECOMPRESSION/DISCECTOMY FUSION 2 LEVELS four/five, five/six;  Surgeon: Eustace Moore, MD;  Location: [place] NEURO ORS;  Service: Neurosurgery;   Laterality: N/A;  ? CESAREAN SECTION    ? x 1 with 5th pregnancy  ? CHOLECYSTECTOMY    ? KNEE ARTHROSCOPY Right 2015  ? TEE WITHOUT CARDIOVERSION N/A 05/21/2013  ? Procedure: TRANSESOPHAGEAL ECHOCARDIOGRAM (TEE);  Surgeon: Sueanne Margarita, MD;  Location: [place];  Service: Cardiovascular;  Laterality: N/A;  ? TOTAL KNEE ARTHROPLASTY Right 01/06/2021  ? TOTAL KNEE ARTHROPLASTY Right 01/06/2021  ? Procedure: RIGHT TOTAL KNEE ARTHROPLASTY;  Surgeon: Meredith Pel, MD;  Location: [place];  Service: Orthopedics;  Laterality: Right;  ? TUBAL LIGATION    ? ? ?Family History  ?Problem Relation Age of Onset  ? Renal Disease Father   ?     dialysis  ? Hypertension Father   ? Heart disease Mother   ? Heart disease Maternal Grandfather   ? Asthma Sister   ? Asthma Maternal Aunt   ? ? ?Social History  ? ?Tobacco Use  ? Smoking status: Former  ?  Packs/day: 0.50  ?  Years: 26.00  ?  Pack years: 13.00  ?  Types: Cigarettes  ?  Quit date: 10/09/2015  ?  Years since quitting: 6.4  ? Smokeless tobacco: Former  ?Vaping Use  ? Vaping Use: Never used  ?Substance Use Topics  ? Alcohol use: Not Currently  ?  Alcohol/week: 0.0 standard drinks  ?  Comment: occ  ? Drug use: No  ? ? ?ROS ? ? ?Objective:  ? ?Vitals: ?BP (!) 189/108 (BP Location: Left Arm)   Pulse 82   Temp 99 ?F (37.2 ?C) (Oral)   Resp 18   SpO2 96%  ? ?BP Readings from Last 3 Encounters:  ?03/07/22 (!) 189/108  ?02/01/22 121/82  ?01/27/22 140/90  ? ?Physical Exam ?Constitutional:   ?   General: She is not in acute distress. ?   Appearance: Normal appearance. She is well-developed. She is not ill-appearing, toxic-appearing or diaphoretic.  ?HENT:  ?   Head: Normocephalic and atraumatic.  ?   Nose: Nose normal.  ?   Mouth/Throat:  ?   Mouth: Mucous membranes are moist.  ?Eyes:  ?   General: No scleral icterus.    ?   Right eye: No discharge.     ?   Left eye: No discharge.  ?   Extraocular Movements: Extraocular movements intact.  ?Cardiovascular:  ?   Rate and Rhythm:  Normal rate.  ?   Heart sounds: No murmur heard. ?  No friction rub. No gallop.  ?Pulmonary:  ?   Effort: Pulmonary effort is normal. No respiratory distress.  ?   Breath sounds: No stridor. No wheezing, rhonchi or rales.  ?Chest:  ?   Chest wall: No tenderness.  ?Musculoskeletal:  ?   Comments: Full range of motion throughout.  Strength 5/5 for upper  and lower extremities.  Patient ambulates without any assistance at expected pace.  No ecchymosis, swelling, lacerations or abrasions.  Patient does not have paraspinal muscle tenderness along the entire back including the midline.  However, she does indicate the left paraspinal muscles and left trapezius sources of her pain.  ?Skin: ?   General: Skin is warm and dry.  ?Neurological:  ?   General: No focal deficit present.  ?   Mental Status: She is alert and oriented to person, place, and time.  ?   Cranial Nerves: No cranial nerve deficit.  ?   Motor: No weakness.  ?   Coordination: Coordination normal.  ?   Gait: Gait normal.  ?   Deep Tendon Reflexes: Reflexes normal.  ?   Comments: Negative Romberg and pronator drift.  No facial asymmetry.  ?Psychiatric:     ?   Mood and Affect: Mood normal.     ?   Behavior: Behavior normal.  ? ? ?ED ECG REPORT ? ? Date: 03/07/2022 ? EKG Time: 12:51 PM ? Rate: 59bpm ? Rhythm: sinus bradycardia,  normal EKG, normal sinus rhythm ? Axis: normal ? Intervals:none ? ST&T Change: T-wave flattening in III ? Narrative Interpretation: Borderline sinus bradycardia 59 bpm with nonspecific T wave flattening as above.  No acute findings. ? ?Assessment and Plan :  ? ?I have reviewed the PDMP during this encounter. ? ?1. Cervical disc disorder with radiculopathy of cervical region   ?2. History of neck surgery   ?3. Essential hypertension   ?4. Essential (primary) hypertension   ?5. Hypertensive urgency   ?6. Atypical chest pain   ?7. Neck pain   ?8. Left arm pain   ? ?Suspect the primary source is cervical radiculopathy.  Counseled that x-rays  do not add value to this diagnosis.  She would be an excellent candidate for steroids but unfortunately due to her severely uncontrolled high blood pressure we will have to avoid this.  I advised the patient's

## 2022-03-07 NOTE — Discharge Instructions (Addendum)
Please schedule Tylenol at 500 mg - 650 mg once every 6 hours as needed for aches and pains.  If you still have pain despite taking Tylenol regularly, this is breakthrough pain.  You can use tramadol once every 6 hours for this.  Once your pain is better controlled, switch back to just Tylenol. ? ? ?For diabetes or elevated blood sugar, please make sure you are limiting and avoiding starchy, carbohydrate foods like pasta, breads, sweet breads, pastry, rice, potatoes, desserts. These foods can elevate your blood sugar. Also, limit and avoid drinks that contain a lot of sugar such as sodas, sweet teas, fruit juices.  Drinking plain water will be much more helpful, try 64 ounces of water daily.  It is okay to flavor your water naturally by cutting cucumber, lemon, mint or lime, placing it in a picture with water and drinking it over a period of 24-48 hours as long as it remains refrigerated. ? ?For elevated blood pressure, make sure you are monitoring salt in your diet.  Do not eat restaurant foods and limit processed foods at home. I highly recommend you prepare and cook your own foods at home.  Processed foods include things like frozen meals, pre-seasoned meats and dinners, deli meats, canned foods as these foods contain a high amount of sodium/salt.  Make sure you are paying attention to sodium labels on foods you buy at the grocery store. Buy your spices separately such as garlic powder, onion powder, cumin, cayenne, parsley flakes so that you can avoid seasonings that contain salt. However, salt-free seasonings are available and can be used, an example is Mrs. Dash and includes a lot of different mixtures that do not contain salt. ? ?Lastly, when cooking using oils that are healthier for you is important. This includes olive oil, avocado oil, canola oil. We have discussed a lot of foods to avoid but below is a list of foods that can be very healthy to use in your diet whether it is for diabetes, cholesterol, high  blood pressure, or in general healthy eating. ? ?Salads - kale, spinach, cabbage, spring mix, arugula ?Fruits - avocadoes, berries (blueberries, raspberries, blackberries), apples, oranges, pomegranate, grapefruit, kiwi ?Vegetables - asparagus, cauliflower, broccoli, green beans, brussel sprouts, bell peppers, beets; stay away from or limit starchy vegetables like potatoes, carrots, peas ?Other general foods - kidney beans, egg whites, almonds, walnuts, sunflower seeds, pumpkin seeds, fat free yogurt, almond milk, flax seeds, quinoa, oats  ?Meat - It is better to eat lean meats and limit your red meat including pork to once a week.  Wild caught fish, chicken breast are good options as they tend to be leaner sources of good protein. Still be mindful of the sodium labels for the meats you buy. ? ?DO NOT EAT ANY FOODS ON THIS LIST THAT YOU ARE ALLERGIC TO. For more specific needs, I highly recommend consulting a dietician or nutritionist but this can definitely be a good starting point. ? ?

## 2022-03-08 ENCOUNTER — Other Ambulatory Visit: Payer: Self-pay

## 2022-03-08 MED ORDER — SPIRIVA RESPIMAT 1.25 MCG/ACT IN AERS
2.0000 | INHALATION_SPRAY | Freq: Every day | RESPIRATORY_TRACT | 5 refills | Status: DC
  Filled 2022-03-08: qty 4, 30d supply, fill #0

## 2022-03-08 MED ORDER — OMEPRAZOLE 40 MG PO CPDR: 40.0000 mg | DELAYED_RELEASE_CAPSULE | Freq: Every day | ORAL | 0 refills | Status: DC

## 2022-03-15 ENCOUNTER — Telehealth: Payer: Self-pay | Admitting: Pulmonary Disease

## 2022-03-15 ENCOUNTER — Ambulatory Visit (INDEPENDENT_AMBULATORY_CARE_PROVIDER_SITE_OTHER): Payer: PRIVATE HEALTH INSURANCE | Admitting: Surgical

## 2022-03-15 ENCOUNTER — Ambulatory Visit (INDEPENDENT_AMBULATORY_CARE_PROVIDER_SITE_OTHER): Payer: PRIVATE HEALTH INSURANCE

## 2022-03-15 ENCOUNTER — Other Ambulatory Visit: Payer: Self-pay

## 2022-03-15 DIAGNOSIS — M79602 Pain in left arm: Secondary | ICD-10-CM | POA: Diagnosis not present

## 2022-03-15 MED ORDER — SPIRIVA RESPIMAT 2.5 MCG/ACT IN AERS: 2.0000 | INHALATION_SPRAY | Freq: Every day | RESPIRATORY_TRACT | 5 refills | Status: DC

## 2022-03-15 MED ORDER — PREDNISONE 10 MG PO TABS: 40.0000 mg | ORAL_TABLET | Freq: Every day | ORAL | 0 refills | Status: AC

## 2022-03-15 MED ORDER — PREDNISONE 20 MG PO TABS: ORAL_TABLET | ORAL | 0 refills | Status: DC

## 2022-03-15 MED ORDER — SPIRIVA RESPIMAT 2.5 MCG/ACT IN AERS: 2.0000 | INHALATION_SPRAY | Freq: Every day | RESPIRATORY_TRACT | 11 refills | Status: DC

## 2022-03-15 NOTE — Telephone Encounter (Signed)
I called and spoke with the pt and notified of response per Dr Loanne Drilling  ?She verbalized understanding  ?Rxs sent to pharm  ?I have her scheduled for rov with JE 04/08/22 ?

## 2022-03-15 NOTE — Telephone Encounter (Signed)
Spoke with the pt  ?She is c/o increased cough past few days  ?Cough is prod with white sputum  ?She is not wheezing, having chest tightness or fever  ?She only feels SOB when has a coughing fot  ?She is taking omeprazole bid, atrovent ns, albuterol, spiriva and advair  ?I offered appt but she declined  ?She is asking for something to be called in  ?Please advise, thanks! ? ?Allergies  ?Allergen Reactions  ? Shellfish Allergy Itching and Swelling  ? Amoxicillin Other (See Comments)  ?  Yeast infection  ? Latex Itching and Rash  ? ? ?

## 2022-03-15 NOTE — Telephone Encounter (Signed)
Please call to schedule patient for routine follow-up in June 2023 ? ?Asthmatic bronchitis exacerbation ?Prednisone 40 mg x 5days ?Continue Advair ?Had benefit with Spiriva inhaler so will rx ? ?If symptoms worsen or not improved, will need acute visit. Patient expressed understanding and agreed to plan ? ?Rodman Pickle, M.D. ?[facility] ?03/15/2022 12:29 PM  ? ?See Amion for personal pager ?For hours between 7 PM to 7 AM, please call Elink for urgent questions ?e ?

## 2022-03-18 ENCOUNTER — Other Ambulatory Visit: Payer: Self-pay

## 2022-03-18 DIAGNOSIS — M79602 Pain in left arm: Secondary | ICD-10-CM

## 2022-03-21 ENCOUNTER — Encounter: Payer: Self-pay | Admitting: Orthopedic Surgery

## 2022-03-21 NOTE — Progress Notes (Signed)
? ?Office Visit Note ?  ?Patient: [name]           ?Date of Birth: 21-Oct-1971           ?MRN: [id number] ?Visit Date: 03/15/2022 ?Requested by: Camillia Herter, NP ?[address] ?[address] ?[place],  [place] [id number] ?PCP: Camillia Herter, NP ? ?Subjective: ?Chief Complaint  ?Patient presents with  ? Left Shoulder - Pain  ? ? ?HPI: [name] is a 51 y.o. female who presents to the office complaining of left arm pain.  Patient states she had an acute onset about 3 weeks ago without injury.  Localizes pain to the left shoulder with radiation from the left side of the neck and radiation down to her forearm from the shoulder.  No radiation into the hand.  She has decreased range of motion in her neck with associated stiffness.  She has numbness and tingling down into her arm in the same distribution.  She has radiation from the neck into the shoulder blade region.  No history of prior left shoulder surgery but she has had prior cervical spine fusion in 2015 by Dr. Ronnald Ramp.  She has been out of work due to the amount of pain she is in.  She works as a Quarry manager.  No history of diabetes, smoking, blood thinner use.  She is right-hand dominant.  She went to urgent care for pain and she was given Ultram with not much relief from taking that.  Denies any weakness.  She does say that her neck position affects her arm pain.Marland Kitchen   ?             ?ROS: All systems reviewed are negative as they relate to the chief complaint within the history of present illness.  Patient denies fevers or chills. ? ?Assessment & Plan: ?Visit Diagnoses:  ?1. Left arm pain   ? ? ?Plan: Patient is a 51 year old female who presents for evaluation of left arm pain.  Patient has left arm pain shooting down from the left side of her neck into the forearm with associated numbness and tingling and scapular pain.  She has history of prior cervical spine fusion in 2015 by Dr. Ronnald Ramp with fusions that appear well consolidated on radiographs today but she does  have degenerative changes at the levels above and below the fusion.  With persistent symptoms that are keeping her out of work as well as exam findings showing positive Lhermitte sign with reproduction of symptoms based on her neck position, plan for further evaluation with MRI cervical spine to evaluate left-sided radiculopathy.  Follow-up after MRI to review results.  She also recently had hypertensive urgency at her urgent care visit and has resumed her amlodipine.  Blood pressure today was 153/95 with heart rate 98 ? ?Follow-Up Instructions: No follow-ups on file.  ? ?Orders:  ?Orders Placed This Encounter  ?Procedures  ? XR Shoulder Left  ? XR Cervical Spine 2 or 3 views  ? ?No orders of the defined types were placed in this encounter. ? ? ? ? Procedures: ?No procedures performed ? ? ?Clinical Data: ?No additional findings. ? ?Objective: ?Vital Signs: There were no vitals taken for this visit. ? ?Physical Exam:  ?Constitutional: Patient appears well-developed ?HEENT:  ?Head: Normocephalic ?Eyes:EOM are normal ?Neck: Normal range of motion ?Cardiovascular: Normal rate ?Pulmonary/chest: Effort normal ?Neurologic: Patient is alert ?Skin: Skin is warm ?Psychiatric: Patient has normal mood and affect ? ?Ortho Exam: Ortho exam demonstrates 5/5 motor  strength of bilateral grip strength, finger abduction, pronation/supination, bicep, tricep, deltoid.  Excellent rotator cuff strength of supra, infra, subscap.  Tenderness throughout the axial cervical spine.  Negative Spurling sign.  Positive Lhermitte sign with pain radiating down the left arm in the same distribution that she complains of pain.  Diminished sensation in the area of her pain distribution.  Decreased cervical spine range of motion actively. ? ?Specialty Comments:  ?No specialty comments available. ? ?Imaging: ?No results found. ? ? ?PMFS History: ?Patient Active Problem List  ? Diagnosis Date Noted  ? Chronic cough 01/27/2022  ? Right thyroid nodule  01/07/2022  ? Essential hypertension 01/05/2022  ? Migraine 01/05/2022  ? Thyroid nodule greater than or equal to 1.5 cm in diameter incidentally noted on imaging study 12/01/2021  ? Grief 02/06/2021  ? Non-intractable vomiting 02/06/2021  ? Arthritis of right knee   ? S/P knee replacement 01/06/2021  ? Morning headache 06/02/2017  ? Leiomyoma of body of uterus 12/24/2016  ? Prediabetes 11/11/2016  ? Pap smear abnormality of cervix with ASCUS favoring benign 01/01/2016  ? COPD (chronic obstructive pulmonary disease) ([facility]) 11/18/2015  ? GERD (gastroesophageal reflux disease) 11/18/2015  ? Abnormal imaging of thyroid 11/04/2015  ? COPD exacerbation ([facility]) 11/01/2015  ? Elevated d-dimer 11/01/2015  ? IDA (iron deficiency anemia) 09/04/2015  ? Patellofemoral disorder 04/04/2015  ? Insomnia 04/09/2014  ? S/P cervical spinal fusion 03/06/2014  ? Class 2 severe obesity due to excess calories with serious comorbidity and body mass index (BMI) of 37.0 to 37.9 in adult [facility]) 02/22/2014  ? Radicular pain in right arm 01/11/2014  ? 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 Other and unspecified hyperlipidemia 08/24/2013  ? History of CVA (cerebrovascular accident) 05/18/2013  ? 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 ?Past Medical History:  ?Diagnosis Date  ? Anemia   ? Anxiety   ? Arthritis   ? right knee, back  ? COPD (chronic obstructive pulmonary disease) ([facility])   ? GERD (gastroesophageal reflux disease)   ? Headache(784.0)   ? History of blood transfusion   ? Hx; of in 1991 after delivery  ? History of TIAs   ? Numbness   ? Right hand  ? Pneumonia   ? Stroke [facility]) 05/2013  ?  ?Family History  ?Problem Relation Age of Onset  ? Renal Disease Father   ?     dialysis  ? Hypertension Father   ? Heart disease Mother   ? Heart disease Maternal Grandfather   ? Asthma Sister   ? Asthma Maternal Aunt   ?  ?Past Surgical History:  ?Procedure Laterality Date  ? ANTERIOR CERVICAL DECOMP/DISCECTOMY FUSION N/A 03/06/2014  ?  Procedure: ANTERIOR CERVICAL DECOMPRESSION/DISCECTOMY FUSION 2 LEVELS four/five, five/six;  Surgeon: Eustace Moore, MD;  Location: [facility] NEURO ORS;  Service: Neurosurgery;  Laterality: N/A;  ? CESAREAN SECTION    ? x 1 with 5th pregnancy  ? CHOLECYSTECTOMY    ? KNEE ARTHROSCOPY Right 2015  ? TEE WITHOUT CARDIOVERSION N/A 05/21/2013  ? Procedure: TRANSESOPHAGEAL ECHOCARDIOGRAM (TEE);  Surgeon: Sueanne Margarita, MD;  Location: [place];  Service: Cardiovascular;  Laterality: N/A;  ? TOTAL KNEE ARTHROPLASTY Right 01/06/2021  ? TOTAL KNEE ARTHROPLASTY Right 01/06/2021  ? Procedure: RIGHT TOTAL KNEE ARTHROPLASTY;  Surgeon: Meredith Pel, MD;  Location: [place];  Service: Orthopedics;  Laterality: Right;  ? TUBAL LIGATION    ? ?Social History  ? ?Occupational History  ? Occupation: CNA  ?  Employer: [employer]  ?Tobacco Use  ? Smoking status: Former  ?  Packs/day: 0.50  ?  Years: 26.00  ?  Pack years: 13.00  ?  Types: Cigarettes  ?  Quit date: 10/09/2015  ?  Years since quitting: 6.4  ? Smokeless tobacco: Former  ?Vaping Use  ? Vaping Use: Never used  ?Substance and Sexual Activity  ? Alcohol use: Not Currently  ?  Alcohol/week: 0.0 standard drinks  ?  Comment: occ  ? Drug use: No  ? Sexual activity: Not Currently  ?  Birth control/protection: Surgical  ? ? ? ? ?  ?

## 2022-03-24 ENCOUNTER — Other Ambulatory Visit: Payer: Self-pay | Admitting: Surgical

## 2022-03-24 ENCOUNTER — Telehealth: Payer: Self-pay | Admitting: Surgical

## 2022-03-24 MED ORDER — ACETAMINOPHEN-CODEINE #3 300-30 MG PO TABS
1.0000 | ORAL_TABLET | Freq: Two times a day (BID) | ORAL | 0 refills | Status: DC | PRN
Start: 2022-03-24 — End: 2022-04-19

## 2022-03-24 NOTE — Telephone Encounter (Signed)
Pt called with complaints of severe pains in left arm and shoulder pain. Pt is asking for pain medication or steroids. Please send to [facility] on [facility]. Pt phone number is [phone] ?

## 2022-03-24 NOTE — Telephone Encounter (Signed)
IC advised patient verbalized understanding.  ?

## 2022-03-24 NOTE — Telephone Encounter (Signed)
Sent in RX for Tylenol with codeine. Dont take with tramadol. Just had prednisone course Rxed by somedone else on 5/8

## 2022-03-28 ENCOUNTER — Other Ambulatory Visit: Payer: Self-pay | Admitting: Family

## 2022-03-28 DIAGNOSIS — I10 Essential (primary) hypertension: Secondary | ICD-10-CM

## 2022-03-30 ENCOUNTER — Ambulatory Visit
Admission: RE | Admit: 2022-03-30 | Discharge: 2022-03-30 | Disposition: A | Discharge status: Home / Self Care | Payer: PRIVATE HEALTH INSURANCE | Source: Ambulatory Visit | Attending: Orthopedic Surgery | Admitting: Orthopedic Surgery

## 2022-03-30 DIAGNOSIS — M79602 Pain in left arm: Secondary | ICD-10-CM

## 2022-03-30 MED ORDER — AMLODIPINE BESYLATE 5 MG PO TABS: 5.0000 mg | ORAL_TABLET | Freq: Every day | ORAL | 0 refills | Status: DC

## 2022-04-08 ENCOUNTER — Encounter: Payer: Self-pay | Admitting: Orthopedic Surgery

## 2022-04-08 ENCOUNTER — Ambulatory Visit: Payer: PRIVATE HEALTH INSURANCE | Admitting: Pulmonary Disease

## 2022-04-08 ENCOUNTER — Ambulatory Visit (INDEPENDENT_AMBULATORY_CARE_PROVIDER_SITE_OTHER): Payer: PRIVATE HEALTH INSURANCE | Admitting: Surgical

## 2022-04-08 DIAGNOSIS — M79602 Pain in left arm: Secondary | ICD-10-CM

## 2022-04-08 NOTE — Progress Notes (Deleted)
Subjective:   PATIENT ID: [name] GENDER: female DOB: Nov 21, 1970, MRN: [id number]   HPI  No chief complaint on file.   Reason for Visit: Follow-up   [name] is a 51 year old female former smoker with hx TIA and chronic headaches who presents for follow-up  Initial consult She reports long standing of respiratory issues including frequent pneumonias as a child and once during her pregnancy at age 35. She has had worsening unproductive cough in the last two years with 3-5 urgent care/ED visits in that timeframe. She reports upper airway congestion and wheezing. Sometimes has post-tussive emesis. Cough occurs all day but seems to occur more at night.  ED note from 06/30/21 by NP Vickki Muff reviewed. She presented with productive cough, nasal congestion and wheezing x 1 week. Treated with Augmentin, prednisone and benzonatate. Advised to be seen by Pulmonary for COPD exacerbation follow-up. She has been on AirDuo Respiclick that she is intermittently is compliant. After she finished her steroids, she is having recurrent wheezing and cough. Reports reflux on omeprazole  11/25/21 Since our last visit she has an asthma/bronchitis flare up in December. She recovered since then but in the last week she has been having shortness of breath and productive cough but difficult to bring up sputum. Has been mucinex for the last two weeks. Intermittent wheezing. Denies fevers, chills. Neg home COVID-19 test five days ago.  01/27/2022 Since her last visit she had as asthma/bronchitis flare requiring prednisone and azithromycin in January and February 2023.  Currently she is on high-dose Advair HFA. She has a persistent cough. Mucinex has improved the congestion. Cough is dry and making her throat irritated. Some wheezing. She has cycles of illness every 3-4 weeks that are relieved with prednisone and azithromycin. Only has shortness of breath coughing episodes.   04/08/2022 Since her last visit  she was treated for asthmatic bronchitis flare in early May.  Reports Spiriva has been helping so this was prescribed.  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  Past Medical History:  Diagnosis Date   Anemia    Anxiety    Arthritis    right knee, back   COPD (chronic obstructive pulmonary disease) ([facility])    GERD (gastroesophageal reflux disease)    Headache(784.0)    History of blood transfusion    Hx; of in 1991 after delivery   History of TIAs    Numbness    Right hand   Pneumonia    Stroke ([facility]) 05/2013     Family History  Problem Relation Age of Onset   Renal Disease Father        dialysis   Hypertension Father    Heart disease Mother    Heart disease Maternal Grandfather    Asthma Sister    Asthma  Maternal Aunt      Social History   Occupational History   Occupation: Forensic psychologist: [employer]  Tobacco Use   Smoking status: Former    Packs/day: 0.50    Years: 26.00    Pack years: 13.00    Types: Cigarettes    Quit date: 10/09/2015    Years since quitting: 6.5   Smokeless tobacco: Former  Scientific laboratory technician Use: Never used  Substance and Sexual Activity   Alcohol use: Not Currently    Alcohol/week: 0.0 standard drinks    Comment: occ   Drug use: No   Sexual activity: Not Currently    Birth control/protection: Surgical    Allergies  Allergen Reactions   Shellfish Allergy Itching and Swelling   Amoxicillin Other (See Comments)    Yeast infection   Latex Itching and Rash     Outpatient Medications Prior to Visit  Medication Sig Dispense Refill    acetaminophen-codeine (TYLENOL #3) 300-30 MG tablet Take 1 tablet by mouth every 12 (twelve) hours as needed for moderate pain. 20 tablet 0   albuterol (PROVENTIL) (2.5 MG/3ML) 0.083% nebulizer solution Take 3 mLs by nebulization every 6 (six) hours as needed for wheezing or shortness of breath. 180 mL 1   albuterol (VENTOLIN HFA) 108 (90 Base) MCG/ACT inhaler Inhale 2 puffs into the lungs every 6 (six) hours as needed for wheezing or shortness of breath. 8 g 5   amLODipine (NORVASC) 5 MG tablet Take 1 tablet (5 mg total) by mouth daily. 120 tablet 0   aspirin 81 MG chewable tablet Chew 1 tablet (81 mg total) by mouth 2 (two) times daily. 60 tablet 0   escitalopram (LEXAPRO) 10 MG tablet Take 1 tablet (10 mg total) by mouth at bedtime. 30 tablet 0   ferrous sulfate 325 (65 FE) MG tablet TAKE 1 TABLET (325 MG TOTAL) BY MOUTH DAILY WITH BREAKFAST. (Patient not taking: Reported on 09/22/2021) 90 tablet 0   ferrous sulfate 325 (65 FE) MG tablet Take 1 tablet (325 mg total) by mouth every other day. 90 tablet 0   ferumoxytol (FERAHEME) 510 MG/17ML SOLN injection Inject 17 mLs (510 mg total) into the vein once for 1 dose. (Patient not taking: Reported on 02/05/2021) 17 mL 0   ferumoxytol (FERAHEME) 510 MG/17ML SOLN injection INJECT 17 MLS (510 MG TOTAL) INTO THE VEIN ONCE FOR 1 DOSE. (Patient not taking: Reported on 09/22/2021) 17 mL 0   fluticasone-salmeterol (ADVAIR HFA) 230-21 MCG/ACT inhaler Inhale 2 puffs into the lungs 2 (two) times daily. 1 each 5   Fluticasone-Salmeterol 232-14 MCG/ACT AEPB Inhale 1 puff into the lungs in the morning and at bedtime. 1 each 5   ipratropium (ATROVENT) 0.06 % nasal spray PLACE 2 SPRAYS INTO BOTH NOSTRILS 4 TIMES DAILY. 15 mL 5   omeprazole (PRILOSEC) 40 MG capsule Take 1 capsule (40 mg total) by mouth daily. 120 capsule 0   SUMAtriptan (IMITREX) 50 MG tablet Take 1 tablet (50 mg total) by mouth every 2 (two) hours as needed for migraine. May repeat in 2 hours if headache  persists or recurs. 10 tablet 0   Tiotropium Bromide Monohydrate (SPIRIVA RESPIMAT) 1.25 MCG/ACT AERS Inhale 2 puffs into the lungs daily. 4 g 5   Tiotropium Bromide Monohydrate (SPIRIVA RESPIMAT) 2.5 MCG/ACT AERS Inhale 2 puffs into the lungs daily. 4 g 5   Tiotropium Bromide Monohydrate (SPIRIVA RESPIMAT) 2.5 MCG/ACT AERS Inhale 2 puffs into the lungs daily. 4 g  11   tiZANidine (ZANAFLEX) 4 MG tablet Take 1 tablet (4 mg total) by mouth at bedtime. 30 tablet 0   No facility-administered medications prior to visit.    ROS   Objective:   There were no vitals filed for this visit.     Physical Exam: General: Well-appearing, no acute distress HENT: [place], AT Eyes: EOMI, no scleral icterus Respiratory: Clear to auscultation bilaterally.  No crackles, wheezing or rales Cardiovascular: RRR, -M/R/G, no JVD Extremities:-Edema,-tenderness Neuro: AAO x4, CNII-XII grossly intact Psych: Normal mood, normal affect  Data Reviewed:  Imaging: CTA 11/01/15 - No pulmonary embolism. Bilateral patchy opacities. Right hilar node measuring 1.4 cm CXR 09/22/21 - Left infrahilar atelectasis. Cervical hardware CT Chest 11/11/21 - Resolution of right hilar adenopathy. Bibasilar scarring. Increased right thyroid lobe size.   PFT: 11/25/21 FVC 1.98 (69%) FEV1 1.7 (74%) Ratio 86  TLC 71% DLCO 73% Interpretation: Mild restrictive defect with mildly reduced gas exchange. No significant bronchodilator response. F-V loops with minimal airway disease.  Labs: CBC    Component Value Date/Time   WBC 7.9 01/02/2022 0435   RBC 5.34 (H) 01/02/2022 0435   HGB 12.6 01/02/2022 0435   HGB 12.2 02/09/2021 1515   HGB 12.4 12/15/2020 1137   HCT 40.5 01/02/2022 0435   HCT 40.5 12/15/2020 1137   PLT 310 01/02/2022 0435   PLT 248 02/09/2021 1515   PLT 308 12/15/2020 1137   MCV 75.8 (L) 01/02/2022 0435   MCV 75 (L) 12/15/2020 1137   MCH 23.6 (L) 01/02/2022 0435   MCHC 31.1 01/02/2022 0435   RDW 17.6 (H) 01/02/2022  0435   RDW 21.7 (H) 11/17/2020 1426   LYMPHSABS 2.4 01/02/2022 0435   LYMPHSABS 2.4 12/15/2020 1137   MONOABS 0.3 01/02/2022 0435   EOSABS 0.0 01/02/2022 0435   EOSABS 0.0 12/15/2020 1137   BASOSABS 0.0 01/02/2022 0435   BASOSABS 0.0 12/15/2020 1137   Absolute eos 02/09/21 - 0     Assessment & Plan:   Discussion: 51 year old female former smoker with restrictive lung defect history of TIA and chronic headaches who presents for follow-up.  Initially seen by me for right hilar mass which has self resolved, suspect inflammatory/reactive.  Has required multiple courses of prednisone and antibiotics for acute on chronic bronchitis/asthma-like flares.  Tessalon Perles are ineffective.  Prednisone effective however developed weight gain and swelling.  Asthmatic bronchitis Mild restrictive defect --CONTINUE Advair 230-21 mcg TWO puffs TWICE a day -- CONTINUE Spiriva 1.25 mcg TWO puffs ONCE a day --CONTINUE Albuterol every 4 hours AS NEEDED --CONTINUE Albuterol nebulizer q6h AS NEEDED  Chronic cough - May be post-viral --CONTINUE omeprazole 20 mg twice a day --START atrovent nasal spray twice a day --Amb ENT referral  Right hilar enlargement - resolved   Health Maintenance Immunization History  Administered Date(s) Administered   Influenza,inj,Quad PF,6+ Mos 09/04/2015, 11/11/2016   Influenza-Unspecified 08/08/2014, 08/22/2021   Moderna Sars-Covid-2 Vaccination 11/10/2019, 12/08/2019, 09/16/2020   Pneumococcal Polysaccharide-23 05/21/2013   Tdap 12/15/2020   CT Lung Screen - not indicated  No orders of the defined types were placed in this encounter.  No orders of the defined types were placed in this encounter.  No follow-ups on file.  I have spent a total time of***-minutes on the day of the appointment reviewing prior documentation, coordinating care and discussing medical diagnosis and plan with the patient/family. Past medical history, allergies, medications were reviewed.  Pertinent imaging, labs and tests included in this note have been reviewed and interpreted  independently by me.  [place], MD [facility] 04/08/2022 7:59 AM  Office Number [phone]

## 2022-04-08 NOTE — Progress Notes (Signed)
Office Visit Note   Patient: [name]           Date of Birth: 1971-01-01           MRN: [id number] Visit Date: 04/08/2022 Requested by: Camillia Herter, NP [place],  [place] [address] PCP: Camillia Herter, NP  Subjective: Chief Complaint  Patient presents with   Other    Scan review     HPI: [name] is a 51 y.o. female who presents to the office for MRI review. Patient denies any changes in symptoms.  Continues to complain mainly of left arm radicular pain and neck pain.  Pain is slightly improved compared with last exam.  She denies any weakness in her arms or any difficulty with abnormal gait.  She has history of C-spine surgery with Dr. Ronnald Ramp.  MRI results revealed: MR Cervical Spine w/o contrast  Result Date: 03/30/2022 CLINICAL DATA:  51 year old female with 3 weeks of pain radiating to the left shoulder and arm. No known injury. EXAM: MRI CERVICAL SPINE WITHOUT CONTRAST TECHNIQUE: Multiplanar, multisequence MR imaging of the cervical spine was performed. No intravenous contrast was administered. COMPARISON:  Cervical spine MRI 01/18/2014. Cervical spine radiographs 03/15/2022. FINDINGS: Alignment: Chronic straightening of cervical lordosis, less reversal of lordosis compared to the preoperative MRI in 2015. Vertebrae: ACDF hardware artifact now C4-C5 and C5-C6. Mild degenerative endplate marrow edema eccentric to the left at the adjacent C6-C7 segment (series 4, image 11). Background bone marrow signal within normal limits. No other No acute osseous abnormality identified. Cord: No spinal cord signal abnormality despite degenerative cord mass effect detailed below. Negative visible upper thoracic canal and cord. Posterior Fossa, vertebral arteries, paraspinal tissues: Cervicomedullary junction is within normal limits. Partially empty sella is chronic. Negative visible posterior fossa. Preserved major vascular flow voids in the neck. Right vertebral  artery appears dominant as in 2015. Negative visible neck soft tissues and lung apices. Disc levels: C2-C3: Chronic disc bulging and endplate spurring eccentric to the right with moderate right side facet and ligament flavum hypertrophy. Borderline spinal stenosis now at this level. Moderate to severe right C3 foraminal stenosis has also progressed. C3-C4: Progressed disc space loss with circumferential disc bulge, anterior endplate spurring, and lobulated posterior and slightly cephalad extrusion of disc (greater on the right series 7, image 7). Mild to moderate facet and ligament flavum hypertrophy has increased on the right. New mild spinal stenosis and mild ventral cord mass effect (same image). Mild to moderate bilateral C4 foraminal stenosis appears increased on the right. C4-C5:  Interval ACDF. Resolved stenosis. C5-C6: Interval ACDF. No stenosis. Residual endplate spurring on the left. C6-C7: Interval disc space loss. Circumferential disc bulge and broad-based posterior component of disc (series 7, image 21). New mild spinal stenosis and ventral cord mass effect. Mild facet and ligament flavum hypertrophy. Mild to moderate bilateral C7 foraminal stenosis is new. C7-T1:  Stable mild facet hypertrophy. No stenosis. IMPRESSION: 1. ACDF C4-C5 and C5-C6 since a 2015 MRI with resolved stenosis at those levels. 2. But progressed degeneration at both adjacent segments: C3-C4 and C6-C7. New mild spinal stenosis AND spinal cord mass effect at both levels. No cord signal abnormality. Up to moderate bilateral foraminal stenosis at both levels. 3. Progressed C2-C3 degeneration with new borderline spinal stenosis and increased moderate to severe right C3 foraminal stenosis. Electronically Signed   By: Genevie Ann M.D.   On: 03/30/2022 15:42  ROS: All systems reviewed are negative as they relate to the chief complaint within the history of present illness.  Patient denies fevers or chills.  Assessment &  Plan: Visit Diagnoses:  1. Left arm pain     Plan: [name] is a 51 y.o. female who presents to the office for review of cervical spine MRI.  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  Also has spinal stenosis and spinal cord mass effect at those levels that was reviewed with her today.  Plan is to set her up for cervical spine ESI with Dr. [name].  She will also follow-up with Dr. Ronnald Ramp for his evaluation after her injection to see how much relief she has had.   Follow-Up Instructions: No follow-ups on file.   Orders:  Orders Placed This Encounter  Procedures   Ambulatory referral to Physical Medicine Rehab   No orders of the defined types were placed in this encounter.     Procedures: No procedures performed   Clinical Data: No additional findings.  Objective: Vital Signs: There were no vitals taken for this visit.  Physical Exam:  Constitutional: Patient appears well-developed HEENT:  Head: Normocephalic Eyes:EOM are normal Neck: Normal range of motion Cardiovascular: Normal rate Pulmonary/chest: Effort normal Neurologic: Patient is alert Skin: Skin is warm Psychiatric: Patient has normal mood and affect  Ortho Exam: Ortho exam demonstrates 5/5 motor strength of bilateral grip strength, finger abduction, pronation/supination, bicep, tricep, deltoid.  No significant tenderness throughout the axial cervical spine.  Positive Lhermitte sign.  Negative Spurling sign.  Specialty Comments:  No specialty comments available.  Imaging: No results found.   PMFS History: Patient Active Problem List   Diagnosis Date Noted   Chronic cough 01/27/2022   Right thyroid nodule 01/07/2022   Essential hypertension 01/05/2022   Migraine 01/05/2022   Thyroid nodule greater than or equal to 1.5 cm in diameter incidentally noted on imaging study 12/01/2021   Grief 02/06/2021    Non-intractable vomiting 02/06/2021   Arthritis of right knee    S/P knee replacement 01/06/2021   Morning headache 06/02/2017   Leiomyoma of body of uterus 12/24/2016   Prediabetes 11/11/2016   Pap smear abnormality of cervix with ASCUS favoring benign 01/01/2016   COPD (chronic obstructive pulmonary disease) ([facility]) 11/18/2015   GERD (gastroesophageal reflux disease) 11/18/2015   Abnormal imaging of thyroid 11/04/2015   COPD exacerbation ([facility]) 11/01/2015   Elevated d-dimer 11/01/2015   IDA (iron deficiency anemia) 09/04/2015   Patellofemoral disorder 04/04/2015   Insomnia 04/09/2014   S/P cervical spinal fusion 03/06/2014   Class 2 severe obesity due to excess calories with serious comorbidity and body mass index (BMI) of 37.0 to 37.9 in adult ([facility]) 02/22/2014   Radicular pain in right arm 01/11/2014   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   Other and unspecified hyperlipidemia 08/24/2013   History of CVA (cerebrovascular accident) 05/18/2013   Tobacco abuse, 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   Past Medical History:  Diagnosis Date   Anemia    Anxiety    Arthritis    right knee, back   COPD (chronic obstructive pulmonary disease) (HCC)    GERD (gastroesophageal reflux disease)    Headache(784.0)    History of blood transfusion    Hx; of in 1991 after delivery   History of TIAs    Numbness    Right hand   Pneumonia    Stroke ([facility])  05/2013    Family History  Problem Relation Age of Onset   Renal Disease Father        dialysis   Hypertension Father    Heart disease Mother    Heart disease Maternal Grandfather    Asthma Sister    Asthma Maternal Aunt     Past Surgical History:  Procedure Laterality Date   ANTERIOR CERVICAL DECOMP/DISCECTOMY FUSION N/A 03/06/2014   Procedure: ANTERIOR CERVICAL DECOMPRESSION/DISCECTOMY FUSION 2 LEVELS four/five, five/six;  Surgeon: Eustace Moore, MD;  Location: [facility] NEURO ORS;  Service: Neurosurgery;  Laterality: N/A;   CESAREAN SECTION      x 1 with 5th pregnancy   CHOLECYSTECTOMY     KNEE ARTHROSCOPY Right 2015   TEE WITHOUT CARDIOVERSION N/A 05/21/2013   Procedure: TRANSESOPHAGEAL ECHOCARDIOGRAM (TEE);  Surgeon: Sueanne Margarita, MD;  Location: [facility];  Service: Cardiovascular;  Laterality: N/A;   TOTAL KNEE ARTHROPLASTY Right 01/06/2021   TOTAL KNEE ARTHROPLASTY Right 01/06/2021   Procedure: RIGHT TOTAL KNEE ARTHROPLASTY;  Surgeon: Meredith Pel, MD;  Location: [facility];  Service: Orthopedics;  Laterality: Right;   TUBAL LIGATION     Social History   Occupational History   Occupation: Forensic psychologist: [employer]  Tobacco Use   Smoking status: Former    Packs/day: 0.50    Years: 26.00    Pack years: 13.00    Types: Cigarettes    Quit date: 10/09/2015    Years since quitting: 6.5   Smokeless tobacco: Former  Scientific laboratory technician Use: Never used  Substance and Sexual Activity   Alcohol use: Not Currently    Alcohol/week: 0.0 standard drinks    Comment: occ   Drug use: No   Sexual activity: Not Currently    Birth control/protection: Surgical

## 2022-04-09 ENCOUNTER — Ambulatory Visit (INDEPENDENT_AMBULATORY_CARE_PROVIDER_SITE_OTHER): Payer: PRIVATE HEALTH INSURANCE | Admitting: Pulmonary Disease

## 2022-04-09 ENCOUNTER — Encounter: Payer: Self-pay | Admitting: Pulmonary Disease

## 2022-04-09 VITALS — BP 130/80 | HR 87 | Temp 98.2°F | Ht 63.5 in | Wt 214.4 lb

## 2022-04-09 DIAGNOSIS — R059 Cough, unspecified: Secondary | ICD-10-CM | POA: Diagnosis not present

## 2022-04-09 DIAGNOSIS — J449 Chronic obstructive pulmonary disease, unspecified: Secondary | ICD-10-CM

## 2022-04-09 DIAGNOSIS — J45901 Unspecified asthma with (acute) exacerbation: Secondary | ICD-10-CM

## 2022-04-09 MED ORDER — ALBUTEROL SULFATE HFA 108 (90 BASE) MCG/ACT IN AERS: 2.0000 | INHALATION_SPRAY | Freq: Four times a day (QID) | RESPIRATORY_TRACT | 5 refills | Status: DC | PRN

## 2022-04-09 MED ORDER — PREDNISONE 10 MG PO TABS: ORAL_TABLET | ORAL | 0 refills | Status: AC

## 2022-04-09 MED ORDER — DOXYCYCLINE HYCLATE 100 MG PO TABS: 100.0000 mg | ORAL_TABLET | Freq: Two times a day (BID) | ORAL | 0 refills | Status: DC

## 2022-04-09 MED ORDER — HYDROCODONE BIT-HOMATROP MBR 5-1.5 MG/5ML PO SOLN: 5.0000 mL | Freq: Four times a day (QID) | ORAL | 0 refills | Status: DC | PRN

## 2022-04-09 MED ORDER — FLUTICASONE-SALMETEROL 230-21 MCG/ACT IN AERO: 2.0000 | INHALATION_SPRAY | Freq: Two times a day (BID) | RESPIRATORY_TRACT | 5 refills | Status: DC

## 2022-04-09 NOTE — Patient Instructions (Signed)
Asthmatic bronchitis Mild restrictive defect --START prednisone taper --START doxycycline x 7day --Cough syrup ordered --CONTINUE Advair 230-21 mcg TWO puffs TWICE a day. REFILL --CONTINUE Spiriva 1.25 mcg TWO puffs ONCE a day --CONTINUE Albuterol every 4 hours AS NEEDED. REFILL --CONTINUE Albuterol nebulizer q6h AS NEEDED  Chronic cough - May be post-viral --CONTINUE omeprazole 20 mg twice a day --CONTINUE atrovent nasal spray twice a day --Re-REFER ENT   Follow-up with me in 3 months

## 2022-04-09 NOTE — Progress Notes (Signed)
Subjective:   PATIENT ID: [name] GENDER: female DOB: [birth year], MRN: [id number]   HPI  Chief Complaint  Patient presents with   Acute Visit    Pt states over the past 2-3 days, she has had complaints of cough that is now productive coughing up yellow-green phlegm and also has been wheezing. States that she also has chest discomfort.    Reason for Visit: Follow-up   [name] is a 51 year old female former smoker with hx TIA and chronic headaches who presents for follow-up  Initial consult She reports long standing of respiratory issues including frequent pneumonias as a child and once during her pregnancy at age 11. She has had worsening unproductive cough in the last two years with 3-5 urgent care/ED visits in that timeframe. She reports upper airway congestion and wheezing. Sometimes has post-tussive emesis. Cough occurs all day but seems to occur more at night.  ED note from 06/30/21 by NP [name] reviewed. She presented with productive cough, nasal congestion and wheezing x 1 week. Treated with Augmentin, prednisone and benzonatate. Advised to be seen by Pulmonary for COPD exacerbation follow-up. She has been on AirDuo Respiclick that she is intermittently is compliant. After she finished her steroids, she is having recurrent wheezing and cough. Reports reflux on omeprazole  11/25/21 Since our last visit she has an asthma/bronchitis flare up in December. She recovered since then but in the last week she has been having shortness of breath and productive cough but difficult to bring up sputum. Has been mucinex for the last two weeks. Intermittent wheezing. Denies fevers, chills. Neg home COVID-19 test five days ago.  01/27/2022 Since her last visit she had as asthma/bronchitis flare requiring prednisone and azithromycin in January and February 2023.  Currently she is on high-dose Advair HFA. She has a persistent cough. Mucinex has improved the congestion. Cough is dry and  making her throat irritated. Some wheezing. She has cycles of illness every 3-4 weeks that are relieved with prednisone and azithromycin. Only has shortness of breath coughing episodes.   04/09/2022 Since her last visit she was treated for asthmatic bronchitis flare in early May with resolution of symptoms.  Reports Spiriva has been helping so this was prescribed.  Referred to ENT last visit however she has not been contacted. She reports she recently developed head pressure x2 days associated with cough wheezing that is new. Compliant with inhalers.  2023 Jan Feb Mar April May June July Aug Sept Oct Nov Dec   X X X  XX         2024 Jan Feb Mar April May June July Aug Sept Oct Nov Dec                2025 Jan Feb Mar April May June July Aug Sept Oct Nov Dec                 Social History: Quit 5 years ago. Started 51 years old. 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  Past Medical History:  Diagnosis Date   Anemia    Anxiety    Arthritis    right knee, back   COPD (chronic obstructive pulmonary disease) (HCC)    GERD (gastroesophageal reflux disease)    Headache(784.0)    History of blood transfusion    Hx; of in 1991 after delivery   History of TIAs  Numbness    Right hand   Pneumonia    Stroke [facility]) 05/2013     Family History  Problem Relation Age of Onset   Renal Disease Father        dialysis   Hypertension Father    Heart disease Mother    Heart disease Maternal Grandfather    Asthma Sister    Asthma Maternal Aunt      Social History   Occupational History   Occupation: Forensic psychologist: [employer]  Tobacco Use   Smoking status: Former    Packs/day: 0.50    Years: 26.00    Pack years: 13.00    Types: Cigarettes    Quit date: 10/09/2015    Years since quitting: 6.5   Smokeless tobacco: Former  Scientific laboratory technician Use: Never used  Substance and Sexual Activity   Alcohol use:  Not Currently    Alcohol/week: 0.0 standard drinks    Comment: occ   Drug use: No   Sexual activity: Not Currently    Birth control/protection: Surgical    Allergies  Allergen Reactions   Shellfish Allergy Itching and Swelling   Amoxicillin Other (See Comments)    Yeast infection   Latex Itching and Rash     Outpatient Medications Prior to Visit  Medication Sig Dispense Refill   acetaminophen-codeine (TYLENOL #3) 300-30 MG tablet Take 1 tablet by mouth every 12 (twelve) hours as needed for moderate pain. 20 tablet 0   amLODipine (NORVASC) 5 MG tablet Take 1 tablet (5 mg total) by mouth daily. 120 tablet 0   aspirin 81 MG chewable tablet Chew 1 tablet (81 mg total) by mouth 2 (two) times daily. 60 tablet 0   ferrous sulfate 325 (65 FE) MG tablet Take 1 tablet (325 mg total) by mouth every other day. 90 tablet 0   ipratropium (ATROVENT) 0.06 % nasal spray PLACE 2 SPRAYS INTO BOTH NOSTRILS 4 TIMES DAILY. 15 mL 5   omeprazole (PRILOSEC) 40 MG capsule Take 1 capsule (40 mg total) by mouth daily. 120 capsule 0   SUMAtriptan (IMITREX) 50 MG tablet Take 1 tablet (50 mg total) by mouth every 2 (two) hours as needed for migraine. May repeat in 2 hours if headache persists or recurs. 10 tablet 0   Tiotropium Bromide Monohydrate (SPIRIVA RESPIMAT) 2.5 MCG/ACT AERS Inhale 2 puffs into the lungs daily. 4 g 5   tiZANidine (ZANAFLEX) 4 MG tablet Take 1 tablet (4 mg total) by mouth at bedtime. 30 tablet 0   albuterol (VENTOLIN HFA) 108 (90 Base) MCG/ACT inhaler Inhale 2 puffs into the lungs every 6 (six) hours as needed for wheezing or shortness of breath. 8 g 5   fluticasone-salmeterol (ADVAIR HFA) 230-21 MCG/ACT inhaler Inhale 2 puffs into the lungs 2 (two) times daily. 1 each 5   albuterol (PROVENTIL) (2.5 MG/3ML) 0.083% nebulizer solution Take 3 mLs by nebulization every 6 (six) hours as needed for wheezing or shortness of breath. 180 mL 1   escitalopram (LEXAPRO) 10 MG tablet Take 1 tablet (10 mg  total) by mouth at bedtime. 30 tablet 0   ferrous sulfate 325 (65 FE) MG tablet TAKE 1 TABLET (325 MG TOTAL) BY MOUTH DAILY WITH BREAKFAST. (Patient not taking: Reported on 09/22/2021) 90 tablet 0   ferumoxytol (FERAHEME) 510 MG/17ML SOLN injection Inject 17 mLs (510 mg total) into the vein once for 1 dose. (Patient not taking: Reported on 02/05/2021) 17 mL 0   ferumoxytol [facility])  510 MG/17ML SOLN injection INJECT 17 MLS (510 MG TOTAL) INTO THE VEIN ONCE FOR 1 DOSE. (Patient not taking: Reported on 09/22/2021) 17 mL 0   Fluticasone-Salmeterol 232-14 MCG/ACT AEPB Inhale 1 puff into the lungs in the morning and at bedtime. 1 each 5   Tiotropium Bromide Monohydrate (SPIRIVA RESPIMAT) 1.25 MCG/ACT AERS Inhale 2 puffs into the lungs daily. 4 g 5   Tiotropium Bromide Monohydrate (SPIRIVA RESPIMAT) 2.5 MCG/ACT AERS Inhale 2 puffs into the lungs daily. 4 g 11   No facility-administered medications prior to visit.    Review of Systems  Constitutional:  Negative for chills, diaphoresis, fever, malaise/fatigue and weight loss.  HENT:  Positive for congestion and sinus pain.   Respiratory:  Positive for cough, sputum production and shortness of breath. Negative for hemoptysis and wheezing.   Cardiovascular:  Negative for chest pain, palpitations and leg swelling.  Neurological:  Positive for headaches.    Objective:   Vitals:   04/09/22 1105  BP: 130/80  Pulse: 87  Temp: 98.2 F (36.8 C)  TempSrc: Oral  SpO2: 96%  Weight: 214 lb 6.4 oz (97.3 kg)  Height: 5' 3.5" (1.613 m)   SpO2: 96 % (RA) O2 Device: None (Room air)  Physical Exam: General: Well-appearing, no acute distress HENT: [place], AT Eyes: EOMI, no scleral icterus Respiratory: Coarse breath sounds bilaterally, scattered wheeze Cardiovascular: RRR, -M/R/G, no JVD Extremities:-Edema,-tenderness Neuro: AAO x4, CNII-XII grossly intact Psych: Normal mood, normal affect  Data Reviewed:  Imaging: CTA 11/01/15 - No pulmonary  embolism. Bilateral patchy opacities. Right hilar node measuring 1.4 cm CXR 09/22/21 - Left infrahilar atelectasis. Cervical hardware CT Chest 11/11/21 - Resolution of right hilar adenopathy. Bibasilar scarring. Increased right thyroid lobe size.   PFT: 11/25/21 FVC 1.98 (69%) FEV1 1.7 (74%) Ratio 86  TLC 71% DLCO 73% Interpretation: Mild restrictive defect with mildly reduced gas exchange. No significant bronchodilator response. F-V loops with minimal airway disease.  Labs: CBC    Component Value Date/Time   WBC 7.9 01/02/2022 0435   RBC 5.34 (H) 01/02/2022 0435   HGB 12.6 01/02/2022 0435   HGB 12.2 02/09/2021 1515   HGB 12.4 12/15/2020 1137   HCT 40.5 01/02/2022 0435   HCT 40.5 12/15/2020 1137   PLT 310 01/02/2022 0435   PLT 248 02/09/2021 1515   PLT 308 12/15/2020 1137   MCV 75.8 (L) 01/02/2022 0435   MCV 75 (L) 12/15/2020 1137   MCH 23.6 (L) 01/02/2022 0435   MCHC 31.1 01/02/2022 0435   RDW 17.6 (H) 01/02/2022 0435   RDW 21.7 (H) 11/17/2020 1426   LYMPHSABS 2.4 01/02/2022 0435   LYMPHSABS 2.4 12/15/2020 1137   MONOABS 0.3 01/02/2022 0435   EOSABS 0.0 01/02/2022 0435   EOSABS 0.0 12/15/2020 1137   BASOSABS 0.0 01/02/2022 0435   BASOSABS 0.0 12/15/2020 1137   Absolute eos 02/09/21 - 0     Assessment & Plan:   Discussion: 51 year old female former smoker with restrictive lung defect history of TIA and chronic headaches who presents for follow-up.  Initially seen by me for right hilar mass which has self resolved, suspect inflammatory/reactive.  Has required multiple courses of prednisone and antibiotics for acute on chronic bronchitis/asthma-like flares.  Tessalon Perles are ineffective.  Prednisone effective however developed weight gain and swelling.  Currently in exacerbation.  Asthmatic bronchitis Mild restrictive defect --START prednisone taper --START doxycycline x 7day --Cough syrup ordered --CONTINUE Advair 230-21 mcg TWO puffs TWICE a day. REFILL --CONTINUE  Spiriva 1.25 mcg  TWO puffs ONCE a day --CONTINUE Albuterol every 4 hours AS NEEDED. REFILL --CONTINUE Albuterol nebulizer q6h AS NEEDED  Chronic cough - May be post-viral --CONTINUE omeprazole 20 mg twice a day --CONTINUE atrovent nasal spray twice a day --Re-REFER ENT   Right hilar enlargement - resolved   Health Maintenance Immunization History  Administered Date(s) Administered   Influenza,inj,Quad PF,6+ Mos 09/04/2015, 11/11/2016   Influenza-Unspecified 08/08/2014, 08/22/2021   Moderna Sars-Covid-2 Vaccination 11/10/2019, 12/08/2019, 09/16/2020   Pneumococcal Polysaccharide-23 05/21/2013   Tdap 12/15/2020   CT Lung Screen - not indicated  Orders Placed This Encounter  Procedures   Ambulatory referral to ENT    Referral Priority:   Routine    Referral Type:   Consultation    Referral Reason:   Specialty Services Required    Requested Specialty:   Otolaryngology    Number of Visits Requested:   1   Meds ordered this encounter  Medications   predniSONE (DELTASONE) 10 MG tablet    Sig: Take 4 tablets (40 mg total) by mouth daily with breakfast for 2 days, THEN 3 tablets (30 mg total) daily with breakfast for 2 days, THEN 2 tablets (20 mg total) daily with breakfast for 2 days, THEN 1 tablet (10 mg total) daily with breakfast for 2 days.    Dispense:  20 tablet    Refill:  0   doxycycline (VIBRA-TABS) 100 MG tablet    Sig: Take 1 tablet (100 mg total) by mouth 2 (two) times daily.    Dispense:  14 tablet    Refill:  0   fluticasone-salmeterol (ADVAIR HFA) 230-21 MCG/ACT inhaler    Sig: Inhale 2 puffs into the lungs 2 (two) times daily.    Dispense:  1 each    Refill:  5   albuterol (VENTOLIN HFA) 108 (90 Base) MCG/ACT inhaler    Sig: Inhale 2 puffs into the lungs every 6 (six) hours as needed for wheezing or shortness of breath.    Dispense:  8 g    Refill:  5   Return in about 3 months (around 07/10/2022).  I have spent a total time of 35-minutes on the day of the  appointment reviewing prior documentation, coordinating care and discussing medical diagnosis and plan with the patient/family. Past medical history, allergies, medications were reviewed. Pertinent imaging, labs and tests included in this note have been reviewed and interpreted independently by me.  [place], MD [facility] 04/09/2022 11:22 AM  Office Number [phone]

## 2022-04-19 ENCOUNTER — Telehealth: Payer: Self-pay | Admitting: Physical Medicine and Rehabilitation

## 2022-04-19 ENCOUNTER — Other Ambulatory Visit: Payer: Self-pay | Admitting: Surgical

## 2022-04-19 MED ORDER — ACETAMINOPHEN-CODEINE 300-30 MG PO TABS
1.0000 | ORAL_TABLET | Freq: Two times a day (BID) | ORAL | 0 refills | Status: DC | PRN
Start: 2022-04-19 — End: 2022-06-03

## 2022-04-19 NOTE — Telephone Encounter (Signed)
Patient called. She would like a appointment with Dr. [name]. She would like a refill on Tylenol #3 as well.

## 2022-04-19 NOTE — Telephone Encounter (Signed)
IC advised per your note she would like you to call her to further discuss. She stated "that does not make any sense to me" .  She stated "I am not even taking that cough medicine anymore. What am I supposed to do just hurt?" Please call patient to further discuss.

## 2022-04-19 NOTE — Telephone Encounter (Signed)
Cant refill Tylenol with codeine, looks like she is taking Hydrocodone liquid that was prescribed in early June

## 2022-04-19 NOTE — Telephone Encounter (Signed)
Called to discuss. She is no longer taking it. I refilled Tylenol #3 as a bridge to her injection

## 2022-04-26 ENCOUNTER — Telehealth: Payer: Self-pay

## 2022-04-26 NOTE — Telephone Encounter (Signed)
Pt requested Rx for appt on 7/17, Please advise

## 2022-04-26 NOTE — Telephone Encounter (Signed)
NA

## 2022-05-03 ENCOUNTER — Ambulatory Visit: Payer: PRIVATE HEALTH INSURANCE | Admitting: Pulmonary Disease

## 2022-05-12 ENCOUNTER — Other Ambulatory Visit: Payer: Self-pay | Admitting: Physical Medicine and Rehabilitation

## 2022-05-12 MED ORDER — DIAZEPAM 5 MG PO TABS: ORAL_TABLET | ORAL | 0 refills | Status: DC

## 2022-05-24 ENCOUNTER — Encounter: Payer: Self-pay | Admitting: Physical Medicine and Rehabilitation

## 2022-05-24 ENCOUNTER — Ambulatory Visit (INDEPENDENT_AMBULATORY_CARE_PROVIDER_SITE_OTHER): Payer: PRIVATE HEALTH INSURANCE | Admitting: Physical Medicine and Rehabilitation

## 2022-05-24 ENCOUNTER — Ambulatory Visit: Payer: Self-pay

## 2022-05-24 DIAGNOSIS — M5416 Radiculopathy, lumbar region: Secondary | ICD-10-CM | POA: Diagnosis not present

## 2022-05-24 MED ORDER — METHYLPREDNISOLONE ACETATE 80 MG/ML IJ SUSP
80.0000 mg | Freq: Once | INTRAMUSCULAR | Status: AC
  Administered 2022-05-24: 80 mg

## 2022-05-24 NOTE — Progress Notes (Signed)
ITA FRITZSCHE - 51 y.o. female MRN [id number]  Date of birth: November 19, 1970  Office Visit Note: Visit Date: 05/24/2022 PCP: Camillia Herter, NP Referred by: Camillia Herter, NP  Subjective: Chief Complaint  Patient presents with   Neck - Pain   Right Shoulder - Pain   Left Shoulder - Pain   HPI:  [name] is a 51 y.o. female who comes in today at the request of Dawn Main, PA-C for planned Left C7-T1 Cervical Interlaminar epidural steroid injection with fluoroscopic guidance.  The patient has failed conservative care including home exercise, medications, time and activity modification.  This injection will be diagnostic and hopefully therapeutic.  Please see requesting physician notes for further details and justification. MRI reviewed with images and spine model.  MRI reviewed in the note below.    ROS Otherwise per HPI.  Assessment & Plan: Visit Diagnoses:    ICD-10-CM   1. Lumbar radiculopathy  M54.16 XR C-ARM NO REPORT    Epidural Steroid injection    methylPREDNISolone acetate (DEPO-MEDROL) injection 80 mg      Plan: No additional findings.   Meds & Orders:  Meds ordered this encounter  Medications   methylPREDNISolone acetate (DEPO-MEDROL) injection 80 mg    Orders Placed This Encounter  Procedures   XR C-ARM NO REPORT   Epidural Steroid injection    Follow-up: Return if symptoms worsen or fail to improve.   Procedures: No procedures performed  Cervical Epidural Steroid Injection - Interlaminar Approach with Fluoroscopic Guidance  Patient: [name]      Date of Birth: [birth year] MRN: [id number] PCP: Camillia Herter, NP      Visit Date: 05/24/2022   Universal Protocol:    Date/Time: 05/25/2311:59 PM  Consent Given By: the patient  Position: PRONE  Additional Comments: Vital signs were monitored before and after the procedure. Patient was prepped and draped in the usual sterile fashion. The correct patient, procedure, and site was  verified.   Injection Procedure Details:   Procedure diagnoses: Lumbar radiculopathy [M54.16]    Meds Administered:  Meds ordered this encounter  Medications   methylPREDNISolone acetate (DEPO-MEDROL) injection 80 mg     Laterality: Left  Location/Site: C7-T1  Needle: 3.5 in., 20 ga. Tuohy  Needle Placement: Paramedian epidural space  Findings:  -Comments: Excellent flow of contrast into the epidural space.  Procedure Details: Using a paramedian approach from the side mentioned above, the region overlying the inferior lamina was localized under fluoroscopic visualization and the soft tissues overlying this structure were infiltrated with 4 ml. of 1% Lidocaine without Epinephrine. A # 20 gauge, Tuohy needle was inserted into the epidural space using a paramedian approach.  The epidural space was localized using loss of resistance along with contralateral oblique bi-planar fluoroscopic views.  After negative aspirate for air, blood, and CSF, a 2 ml. volume of Isovue-250 was injected into the epidural space and the flow of contrast was observed. Radiographs were obtained for documentation purposes.   The injectate was administered into the level noted above.  Additional Comments:  The patient tolerated the procedure well Dressing: 2 x 2 sterile gauze and Band-Aid    Post-procedure details: Patient was observed during the procedure. Post-procedure instructions were reviewed.  Patient left the clinic in stable condition.   Clinical History: MRI CERVICAL SPINE WITHOUT CONTRAST   TECHNIQUE: Multiplanar, multisequence MR imaging of the cervical spine was performed. No intravenous contrast was administered.   COMPARISON:  Cervical spine  MRI 01/18/2014. Cervical spine radiographs 03/15/2022.   FINDINGS: Alignment: Chronic straightening of cervical lordosis, less reversal of lordosis compared to the preoperative MRI in 2015.   Vertebrae: ACDF hardware artifact now C4-C5  and C5-C6. Mild degenerative endplate marrow edema eccentric to the left at the adjacent C6-C7 segment (series 4, image 11). Background bone marrow signal within normal limits. No other No acute osseous abnormality identified.   Cord: No spinal cord signal abnormality despite degenerative cord mass effect detailed below. Negative visible upper thoracic canal and cord.   Posterior Fossa, vertebral arteries, paraspinal tissues: Cervicomedullary junction is within normal limits. Partially empty sella is chronic. Negative visible posterior fossa. Preserved major vascular flow voids in the neck. Right vertebral artery appears dominant as in 2015. Negative visible neck soft tissues and lung apices.   Disc levels:   C2-C3: Chronic disc bulging and endplate spurring eccentric to the right with moderate right side facet and ligament flavum hypertrophy. Borderline spinal stenosis now at this level. Moderate to severe right C3 foraminal stenosis has also progressed.   C3-C4: Progressed disc space loss with circumferential disc bulge, anterior endplate spurring, and lobulated posterior and slightly cephalad extrusion of disc (greater on the right series 7, image 7). Mild to moderate facet and ligament flavum hypertrophy has increased on the right. New mild spinal stenosis and mild ventral cord mass effect (same image). Mild to moderate bilateral C4 foraminal stenosis appears increased on the right.   C4-C5:  Interval ACDF. Resolved stenosis.   C5-C6: Interval ACDF. No stenosis. Residual endplate spurring on the left.   C6-C7: Interval disc space loss. Circumferential disc bulge and broad-based posterior component of disc (series 7, image 21). New mild spinal stenosis and ventral cord mass effect. Mild facet and ligament flavum hypertrophy. Mild to moderate bilateral C7 foraminal stenosis is new.   C7-T1:  Stable mild facet hypertrophy. No stenosis.   IMPRESSION: 1. ACDF C4-C5 and  C5-C6 since a 2015 MRI with resolved stenosis at those levels.   2. But progressed degeneration at both adjacent segments: C3-C4 and C6-C7. New mild spinal stenosis AND spinal cord mass effect at both levels. No cord signal abnormality. Up to moderate bilateral foraminal stenosis at both levels.   3. Progressed C2-C3 degeneration with new borderline spinal stenosis and increased moderate to severe right C3 foraminal stenosis.     Electronically Signed   By: Genevie Ann M.D.   On: 03/30/2022 15:42     Objective:  VS:  HT:    WT:   BMI:     BP:   HR: bpm  TEMP: ( )  RESP:  Physical Exam Vitals and nursing note reviewed.  Constitutional:      General: She is not in acute distress.    Appearance: Normal appearance. She is not ill-appearing.  HENT:     Head: Normocephalic and atraumatic.     Right Ear: External ear normal.     Left Ear: External ear normal.  Eyes:     Extraocular Movements: Extraocular movements intact.  Cardiovascular:     Rate and Rhythm: Normal rate.     Pulses: Normal pulses.  Musculoskeletal:     Cervical back: Tenderness present. No rigidity.     Right lower leg: No edema.     Left lower leg: No edema.     Comments: Patient has good strength in the upper extremities including 5 out of 5 strength in wrist extension long finger flexion and APB.  There is no  atrophy of the hands intrinsically.  There is a negative Hoffmann's test.   Lymphadenopathy:     Cervical: No cervical adenopathy.  Skin:    Findings: No erythema, lesion or rash.  Neurological:     General: No focal deficit present.     Mental Status: She is alert and oriented to person, place, and time.     Sensory: No sensory deficit.     Motor: No weakness or abnormal muscle tone.     Coordination: Coordination normal.  Psychiatric:        Mood and Affect: Mood normal.        Behavior: Behavior normal.      Imaging: XR C-ARM NO REPORT  Result Date: 05/24/2022 Please see Notes tab for  imaging impression.

## 2022-05-24 NOTE — Patient Instructions (Signed)

## 2022-05-24 NOTE — Progress Notes (Signed)
Pt state neck pain that travels down both shoulders. Pt state laying down and any movements can makes the pain worse. Pt state she takes over the counter pain meds to help ease her pain.  Numeric Pain Rating Scale and Functional Assessment Average Pain 6   In the last MONTH (on 0-10 scale) has pain interfered with the following?  1. General activity like being  able to carry out your everyday physical activities such as walking, climbing stairs, carrying groceries, or moving a chair?  Rating(8)   +Driver, -BT, -Dye Allergies.

## 2022-05-24 NOTE — Progress Notes (Signed)
Patient ID: [name], female    DOB: Dec 18, 1970  MRN: [id number]  CC: Chronic Care Management  Subjective: [name] is a 51 y.o. female who presents for chronic care management.   Her concerns today include:  - Doing well on blood pressure medication, no issues/concerns.  - Doing well on acid reflux medication, no issues/concerns. - Needs refill of Gabapentin and requesting dosage increase for nerve-related pain. History of stroke years ago. She is scheduled to see Neurology soon. Once reestablished with them Gabapentin will be managed by the same. Patient verbalized understanding.  - Having less headaches which are manageable and lost 20 pounds since switching from 3rd shift to 2nd shift. Plans to discuss with Neurology any recommendations for headaches.  - Discussed Tizanidine to be managed by established orthopedist. Patient verbalized understanding.  - She is established with Pulmonology.  - Needs refills of Lexapro. Sometimes forgets to take. Recent significant changes of son going to college soon. She denies thoughts of self-harm, suicidal ideations, and homicidal ideations.      06/03/2022    9:08 AM 02/01/2022    9:12 AM 01/04/2022   11:05 AM 07/28/2021   12:04 PM 02/05/2021    4:05 PM  Depression screen PHQ 2/9  Decreased Interest 0 0 2 2 0  Down, Depressed, Hopeless 0 0 1 1 0  PHQ - 2 Score 0 0 3 3 0  Altered sleeping  0 '1 3 3  '$ Tired, decreased energy  0 '2 2 3  '$ Change in appetite  0 '1 2 2  '$ Feeling bad or failure about yourself   0 0 1 0  Trouble concentrating  0 0 1 0  Moving slowly or fidgety/restless  0 0 1 0  Suicidal thoughts  0 0 0 0  PHQ-9 Score  0 '7 13 8    '$ Patient Active Problem List   Diagnosis Date Noted   Asthmatic bronchitis with acute exacerbation 04/09/2022   Chronic cough 01/27/2022   Right thyroid nodule 01/07/2022   Essential hypertension 01/05/2022   Migraine 01/05/2022   Thyroid nodule greater than or equal to 1.5 cm in diameter  incidentally noted on imaging study 12/01/2021   Grief 02/06/2021   Non-intractable vomiting 02/06/2021   Arthritis of right knee    S/P knee replacement 01/06/2021   Morning headache 06/02/2017   Leiomyoma of body of uterus 12/24/2016   Prediabetes 11/11/2016   Pap smear abnormality of cervix with ASCUS favoring benign 01/01/2016   COPD (chronic obstructive pulmonary disease) ([place]) 11/18/2015   GERD (gastroesophageal reflux disease) 11/18/2015   Abnormal imaging of thyroid 11/04/2015   COPD exacerbation ([place]) 11/01/2015   Elevated d-dimer 11/01/2015   IDA (iron deficiency anemia) 09/04/2015   Patellofemoral disorder 04/04/2015   Insomnia 04/09/2014   S/P cervical spinal fusion 03/06/2014   Class 2 severe obesity due to excess calories with serious comorbidity and body mass index (BMI) of 37.0 to 37.9 in adult ([place]) 02/22/2014   Radicular pain in right arm 01/11/2014   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   Other and unspecified hyperlipidemia 08/24/2013   History of CVA (cerebrovascular accident) 05/18/2013   Tobacco abuse, 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 180 mL 1   albuterol (VENTOLIN HFA)  108 (90 Base) MCG/ACT inhaler Inhale 2 puffs into the lungs every 6 (six) hours as needed for wheezing or shortness of breath. 8 g 5   aspirin 81 MG chewable tablet Chew 1 tablet (81 mg total) by mouth 2 (two) times daily. 60 tablet 0   atorvastatin (LIPITOR) 40 MG tablet      clopidogrel (PLAVIX) 75 MG tablet      ferrous sulfate 325 (65 FE) MG tablet Take 1 tablet (325 mg total) by mouth every other day. 90 tablet 0   ipratropium (ATROVENT) 0.06 % nasal spray PLACE 2 SPRAYS INTO BOTH NOSTRILS 4 TIMES DAILY. 15 mL 5   SUMAtriptan (IMITREX) 50 MG tablet  Take 1 tablet (50 mg total) by mouth every 2 (two) hours as needed for migraine. May repeat in 2 hours if headache persists or recurs. 10 tablet 0   Tiotropium Bromide Monohydrate (SPIRIVA RESPIMAT) 2.5 MCG/ACT AERS Inhale 2 puffs into the lungs daily. 4 g 5   tiZANidine (ZANAFLEX) 4 MG tablet Take 1 tablet (4 mg total) by mouth at bedtime. 30 tablet 0   No current facility-administered medications on file prior to visit.    Allergies  Allergen Reactions   Shellfish Allergy Itching and Swelling   Amoxicillin Other (See Comments)    Yeast infection   Latex Itching and Rash    Social History   Socioeconomic History   Marital status: Divorced    Spouse name: Not on file   Number of children: 7   Years of education: GEd   Highest education level: Not on file  Occupational History   Occupation: CNA    Employer: [employer]  Tobacco Use   Smoking status: Former    Packs/day: 0.50    Years: 26.00    Total pack years: 13.00    Types: Cigarettes    Quit date: 10/09/2015    Years since quitting: 6.6    Passive exposure: Past   Smokeless tobacco: Former  Scientific laboratory technician Use: Never used  Substance and Sexual Activity   Alcohol use: Not Currently    Alcohol/week: 0.0 standard drinks of alcohol    Comment: occ   Drug use: No   Sexual activity: Not Currently    Birth control/protection: Surgical  Other Topics Concern   Not on file  Social History Narrative   Patient lives at home with her family    She has 7 children   5 grown and out of the house   2 at home 92 and 41   She works as a Quarry manager at [employer] Strain: Not on [employer] Insecurity: Not on file  Transportation Needs: Not on file  Physical Activity: Not on file  Stress: Not on file  Social Connections: Not on file  Intimate Partner Violence: Not on file    Family History  Problem Relation Age of Onset   Renal Disease Father        dialysis    Hypertension Father    Heart disease Mother    Heart disease Maternal Grandfather    Asthma Sister    Asthma Maternal Aunt     Past Surgical History:  Procedure Laterality Date   ANTERIOR CERVICAL DECOMP/DISCECTOMY FUSION N/A 03/06/2014   Procedure: ANTERIOR CERVICAL DECOMPRESSION/DISCECTOMY FUSION 2 LEVELS four/five, five/six;  Surgeon: Eustace Moore, MD;  Location: [facility];  Service: Neurosurgery;  Laterality: N/A;   CESAREAN  SECTION     x 1 with 5th pregnancy   CHOLECYSTECTOMY     KNEE ARTHROSCOPY Right 2015   TEE WITHOUT CARDIOVERSION N/A 05/21/2013   Procedure: TRANSESOPHAGEAL ECHOCARDIOGRAM (TEE);  Surgeon: Sueanne Margarita, MD;  Location: [place];  Service: Cardiovascular;  Laterality: N/A;   TOTAL KNEE ARTHROPLASTY Right 01/06/2021   TOTAL KNEE ARTHROPLASTY Right 01/06/2021   Procedure: RIGHT TOTAL KNEE ARTHROPLASTY;  Surgeon: Meredith Pel, MD;  Location: [place];  Service: Orthopedics;  Laterality: Right;   TUBAL LIGATION      ROS: Review of Systems Negative except as stated above  PHYSICAL EXAM: BP 111/80 (BP Location: Left Arm, Patient Position: Sitting, Cuff Size: Large)   Pulse 69   Temp 98.3 F (36.8 C)   Resp 16   Ht 5' 3.5" (1.613 m)   Wt 202 lb (91.6 kg)   SpO2 96%   BMI 35.22 kg/m   Physical Exam HENT:     Head: Normocephalic and atraumatic.  Eyes:     Extraocular Movements: Extraocular movements intact.     Conjunctiva/sclera: Conjunctivae normal.     Pupils: Pupils are equal, round, and reactive to light.  Cardiovascular:     Rate and Rhythm: Normal rate and regular rhythm.     Pulses: Normal pulses.     Heart sounds: Normal heart sounds.  Pulmonary:     Effort: Pulmonary effort is normal.     Breath sounds: Normal breath sounds.  Musculoskeletal:     Cervical back: Normal range of motion and neck supple.  Neurological:     General: No focal deficit present.     Mental Status: She is alert and oriented to person, place, and time.   Psychiatric:        Mood and Affect: Mood normal.        Behavior: Behavior normal.     ASSESSMENT AND PLAN: 1. Essential (primary) hypertension - Continue Amlodipine as prescribed.  - Counseled on blood pressure goal of less than 130/80, low-sodium, DASH diet, medication compliance, and 150 minutes of moderate intensity exercise per week as tolerated. Counseled on medication adherence and adverse effects. - Update BMP.  - Follow-up with primary provider in 4 months or sooner if needed.  - Basic Metabolic Panel - amLODipine (NORVASC) 5 MG tablet; Take 1 tablet (5 mg total) by mouth daily.  Dispense: 30 tablet; Refill: 3  2. Gastroesophageal reflux disease, unspecified whether esophagitis present - Continue Omeprazole as prescribed.  - Follow-up with primary provider as scheduled. - omeprazole (PRILOSEC) 40 MG capsule; Take 1 capsule (40 mg total) by mouth daily.  Dispense: 90 capsule; Refill: 1  3. Generalized anxiety disorder - Patient denies thoughts of self-harm, suicidal ideations, homicidal ideations. - Continue Escitalopram as prescribed.  - Follow-up with primary provider as scheduled. - escitalopram (LEXAPRO) 10 MG tablet; Take 1 tablet (10 mg total) by mouth at bedtime.  Dispense: 30 tablet; Refill: 3  4. Lumbar radiculopathy 5. Left arm pain - Keep all scheduled appointments with Orthopedics.   6. Nerve pain - Increase Gabapentin from 100 mg three times daily to 100 mg 4 times daily.  - Keep upcoming appointment 06/09/2022 with Neurology.  - gabapentin (NEURONTIN) 100 MG capsule; Take 1 capsule (100 mg total) by mouth 4 (four) times daily.  Dispense: 120 capsule; Refill: 0    Patient was given the opportunity to ask questions.  Patient verbalized understanding of the plan and was able to repeat key elements of the plan.  Patient was given clear instructions to go to Emergency Department or return to medical center if symptoms don't improve, worsen, or new problems  develop.The patient verbalized understanding.   Orders Placed This Encounter  Procedures   Basic Metabolic Panel     Requested Prescriptions   Signed Prescriptions Disp Refills   omeprazole (PRILOSEC) 40 MG capsule 90 capsule 1    Sig: Take 1 capsule (40 mg total) by mouth daily.   gabapentin (NEURONTIN) 100 MG capsule 120 capsule 0    Sig: Take 1 capsule (100 mg total) by mouth 4 (four) times daily.   escitalopram (LEXAPRO) 10 MG tablet 30 tablet 3    Sig: Take 1 tablet (10 mg total) by mouth at bedtime.   amLODipine (NORVASC) 5 MG tablet 30 tablet 3    Sig: Take 1 tablet (5 mg total) by mouth daily.    Return in about 4 months (around 10/04/2022) for Follow-Up or next available chronic care mgmt.  Camillia Herter, NP

## 2022-05-24 NOTE — Procedures (Signed)
Cervical Epidural Steroid Injection - Interlaminar Approach with Fluoroscopic Guidance  Patient: [name]      Date of Birth: Dec 09, 1970 MRN: [id number] PCP: Camillia Herter, NP      Visit Date: 05/24/2022   Universal Protocol:    Date/Time: 05/25/2311:59 PM  Consent Given By: the patient  Position: PRONE  Additional Comments: Vital signs were monitored before and after the procedure. Patient was prepped and draped in the usual sterile fashion. The correct patient, procedure, and site was verified.   Injection Procedure Details:   Procedure diagnoses: Lumbar radiculopathy [M54.16]    Meds Administered:  Meds ordered this encounter  Medications   methylPREDNISolone acetate (DEPO-MEDROL) injection 80 mg     Laterality: Left  Location/Site: C7-T1  Needle: 3.5 in., 20 ga. Tuohy  Needle Placement: Paramedian epidural space  Findings:  -Comments: Excellent flow of contrast into the epidural space.  Procedure Details: Using a paramedian approach from the side mentioned above, the region overlying the inferior lamina was localized under fluoroscopic visualization and the soft tissues overlying this structure were infiltrated with 4 ml. of 1% Lidocaine without Epinephrine. A # 20 gauge, Tuohy needle was inserted into the epidural space using a paramedian approach.  The epidural space was localized using loss of resistance along with contralateral oblique bi-planar fluoroscopic views.  After negative aspirate for air, blood, and CSF, a 2 ml. volume of Isovue-250 was injected into the epidural space and the flow of contrast was observed. Radiographs were obtained for documentation purposes.   The injectate was administered into the level noted above.  Additional Comments:  The patient tolerated the procedure well Dressing: 2 x 2 sterile gauze and Band-Aid    Post-procedure details: Patient was observed during the procedure. Post-procedure instructions were  reviewed.  Patient left the clinic in stable condition.

## 2022-06-03 ENCOUNTER — Encounter: Payer: Self-pay | Admitting: Family

## 2022-06-03 ENCOUNTER — Other Ambulatory Visit: Payer: Self-pay | Admitting: Family

## 2022-06-03 ENCOUNTER — Ambulatory Visit (INDEPENDENT_AMBULATORY_CARE_PROVIDER_SITE_OTHER): Payer: PRIVATE HEALTH INSURANCE | Admitting: Family

## 2022-06-03 VITALS — BP 111/80 | HR 69 | Temp 98.3°F | Resp 16 | Ht 63.5 in | Wt 202.0 lb

## 2022-06-03 DIAGNOSIS — K219 Gastro-esophageal reflux disease without esophagitis: Secondary | ICD-10-CM

## 2022-06-03 DIAGNOSIS — F411 Generalized anxiety disorder: Secondary | ICD-10-CM

## 2022-06-03 DIAGNOSIS — M79602 Pain in left arm: Secondary | ICD-10-CM

## 2022-06-03 DIAGNOSIS — M5416 Radiculopathy, lumbar region: Secondary | ICD-10-CM | POA: Diagnosis not present

## 2022-06-03 DIAGNOSIS — I10 Essential (primary) hypertension: Secondary | ICD-10-CM

## 2022-06-03 DIAGNOSIS — M792 Neuralgia and neuritis, unspecified: Secondary | ICD-10-CM

## 2022-06-03 MED ORDER — GABAPENTIN 100 MG PO CAPS: 100.0000 mg | ORAL_CAPSULE | Freq: Four times a day (QID) | ORAL | 0 refills | Status: AC

## 2022-06-03 MED ORDER — ESCITALOPRAM OXALATE 10 MG PO TABS: 10.0000 mg | ORAL_TABLET | Freq: Every day | ORAL | 3 refills | Status: DC

## 2022-06-03 MED ORDER — AMLODIPINE BESYLATE 5 MG PO TABS: 5.0000 mg | ORAL_TABLET | Freq: Every day | ORAL | 3 refills | Status: DC

## 2022-06-03 MED ORDER — OMEPRAZOLE 40 MG PO CPDR: 40.0000 mg | DELAYED_RELEASE_CAPSULE | Freq: Every day | ORAL | 1 refills | Status: DC

## 2022-06-03 NOTE — Progress Notes (Signed)
Pt presents for chronic care management needs refill on Omeprazole 40 mg,Gabapentin 100 mg,Tizanidine 4 mg and Lexapro 10 mg but request to increase dosage on the Gabapentin

## 2022-06-04 LAB — BASIC METABOLIC PANEL
BUN/Creatinine Ratio: 18 (ref 9–23)
BUN: 14 mg/dL (ref 6–24)
CO2: 24 mmol/L (ref 20–29)
Calcium: 9.2 mg/dL (ref 8.7–10.2)
Chloride: 102 mmol/L (ref 96–106)
Creatinine, Ser: 0.77 mg/dL (ref 0.57–1.00)
Glucose: 98 mg/dL (ref 70–99)
Potassium: 3.7 mmol/L (ref 3.5–5.2)
Sodium: 140 mmol/L (ref 134–144)
eGFR: 93 mL/min/{1.73_m2} (ref >59)

## 2022-06-08 NOTE — Progress Notes (Deleted)
NEUROLOGY CONSULTATION NOTE  [name] MRN: [id number] DOB: 1971/11/05  Referring provider: Durene Fruits, NP Primary care provider: Durene Fruits, NP  Reason for consult:  headache  Assessment/Plan:   ***   Subjective:  [name] is a 51 year old female with HTN and history of TIAs and stroke who presents for headaches and neuralgia.  History supplemented by referring provider's and prior neurologist's notes.  History of headaches since ***.  CT head on 01/02/2022 personally reviewed was unremarkable.    History of cervical spinal fusion in 2015.  In April, she began experiencing left-sided neck pain radiating up to occiput and intermittently to the left arm.  No weakness or numbness.  MRI of cervical spine on 03/30/2022 personally reviewed showed ACDF C4-5 and C5-6, degeneration at C3-4 and C6-7 with mild spinal stenosis and mild cord mass effect and up to moderate bilateral foraminal stenosis at both levels, and C2-3 degeneration with borderline spinal stenosis and moderate-severe right C3 foraminal stenosis.  Followed by orthopedics.  ***  She has history of 2 strokes in 2014.  MRIs and MRAs of head personally reviewed.  On 05/18/2013, she had a right MCA territory embolic stroke presenting with transient left arm numbness, left facial numbness and left sided facial droop.  On 08/21/2013 she had a left parietal infarct presenting with recurrent transient right arm numbness lasting several minutes. She underwent extensive workup without any source found for stroke.  She has been on   Past NSAIDS/analgesics:  Excedrin, naproxen, tramadol, hydrocodone Past abortive triptans:  *** Past abortive ergotamine:  *** Past muscle relaxants:  cyclobenzaprine, methocarbamol Past anti-emetic:  metoclopramide, ondansetron Past antihypertensive medications:  *** Past antidepressant medications:  sertraline Past anticonvulsant medications:  *** Past anti-CGRP:  *** Past  vitamins/Herbal/Supplements:  *** Past antihistamines/decongestants:  *** Other past therapies:  ***  Current NSAIDS/analgesics:  *** Current triptans:  sumatriptan '50mg'$  Current ergotamine:  none Current anti-emetic:  none Current muscle relaxants:  tizanidine '4mg'$  QHS Current Antihypertensive medications:  amlodipine '5mg'$  Current Antidepressant medications:  escitalopram '10mg'$   Current Anticonvulsant medications:  none Current anti-CGRP:  none Current Vitamins/Herbal/Supplements:  ferrous sulfate Current Antihistamines/Decongestants:  none Other therapy:  *** Hormone/birth control:  none Other medications:  ASA '81mg'$  BID, Plavix '75mg'$ , atorvastatin '40mg'$        PAST MEDICAL HISTORY: Past Medical History:  Diagnosis Date   Anemia    Anxiety    Arthritis    right knee, back   COPD (chronic obstructive pulmonary disease) (HCC)    GERD (gastroesophageal reflux disease)    Headache(784.0)    History of blood transfusion    Hx; of in 1991 after delivery   History of TIAs    Numbness    Right hand   Pneumonia    Stroke ([place]) 05/2013    PAST SURGICAL HISTORY: Past Surgical History:  Procedure Laterality Date   ANTERIOR CERVICAL DECOMP/DISCECTOMY FUSION N/A 03/06/2014   Procedure: ANTERIOR CERVICAL DECOMPRESSION/DISCECTOMY FUSION 2 LEVELS four/five, five/six;  Surgeon: Eustace Moore, MD;  Location: [facility];  Service: Neurosurgery;  Laterality: N/A;   CESAREAN SECTION     x 1 with 5th pregnancy   CHOLECYSTECTOMY     KNEE ARTHROSCOPY Right 2015   TEE WITHOUT CARDIOVERSION N/A 05/21/2013   Procedure: TRANSESOPHAGEAL ECHOCARDIOGRAM (TEE);  Surgeon: Sueanne Margarita, MD;  Location: [place];  Service: Cardiovascular;  Laterality: N/A;   TOTAL KNEE ARTHROPLASTY Right 01/06/2021   TOTAL KNEE ARTHROPLASTY Right 01/06/2021   Procedure:  RIGHT TOTAL KNEE ARTHROPLASTY;  Surgeon: Meredith Pel, MD;  Location: [facility];  Service: Orthopedics;  Laterality: Right;   TUBAL LIGATION       MEDICATIONS: Current Outpatient Medications on File Prior to Visit  Medication Sig Dispense Refill   albuterol (PROVENTIL) (2.5 MG/3ML) 0.083% nebulizer solution Take 3 mLs by nebulization every 6 (six) hours as needed for wheezing or shortness of breath. 180 mL 1   albuterol (VENTOLIN HFA) 108 (90 Base) MCG/ACT inhaler Inhale 2 puffs into the lungs every 6 (six) hours as needed for wheezing or shortness of breath. 8 g 5   amLODipine (NORVASC) 5 MG tablet Take 1 tablet (5 mg total) by mouth daily. 30 tablet 3   aspirin 81 MG chewable tablet Chew 1 tablet (81 mg total) by mouth 2 (two) times daily. 60 tablet 0   atorvastatin (LIPITOR) 40 MG tablet      clopidogrel (PLAVIX) 75 MG tablet      escitalopram (LEXAPRO) 10 MG tablet Take 1 tablet (10 mg total) by mouth at bedtime. 30 tablet 3   ferrous sulfate 325 (65 FE) MG tablet Take 1 tablet (325 mg total) by mouth every other day. 90 tablet 0   gabapentin (NEURONTIN) 100 MG capsule Take 1 capsule (100 mg total) by mouth 4 (four) times daily. 120 capsule 0   ipratropium (ATROVENT) 0.06 % nasal spray PLACE 2 SPRAYS INTO BOTH NOSTRILS 4 TIMES DAILY. 15 mL 5   omeprazole (PRILOSEC) 40 MG capsule Take 1 capsule (40 mg total) by mouth daily. 90 capsule 1   SUMAtriptan (IMITREX) 50 MG tablet Take 1 tablet (50 mg total) by mouth every 2 (two) hours as needed for migraine. May repeat in 2 hours if headache persists or recurs. 10 tablet 0   Tiotropium Bromide Monohydrate (SPIRIVA RESPIMAT) 2.5 MCG/ACT AERS Inhale 2 puffs into the lungs daily. 4 g 5   tiZANidine (ZANAFLEX) 4 MG tablet Take 1 tablet (4 mg total) by mouth at bedtime. 30 tablet 0   No current facility-administered medications on file prior to visit.    ALLERGIES: Allergies  Allergen Reactions   Shellfish Allergy Itching and Swelling   Amoxicillin Other (See Comments)    Yeast infection   Latex Itching and Rash    FAMILY HISTORY: Family History  Problem Relation Age of Onset    Renal Disease Father        dialysis   Hypertension Father    Heart disease Mother    Heart disease Maternal Grandfather    Asthma Sister    Asthma Maternal Aunt     Objective:  *** General: No acute distress.  Patient appears well-groomed.   Head:  Normocephalic/atraumatic Eyes:  fundi examined but not visualized Neck: supple, no paraspinal tenderness, full range of motion Back: No paraspinal tenderness Heart: regular rate and rhythm Lungs: Clear to auscultation bilaterally. Vascular: No carotid bruits. Neurological Exam: Mental status: alert and oriented to person, place, and time, speech fluent and not dysarthric, language intact. Cranial nerves: CN I: not tested CN II: pupils equal, round and reactive to light, visual fields intact CN III, IV, VI:  full range of motion, no nystagmus, no ptosis CN V: facial sensation intact. CN VII: upper and lower face symmetric CN VIII: hearing intact CN IX, X: gag intact, uvula midline CN XI: sternocleidomastoid and trapezius muscles intact CN XII: tongue midline Bulk & Tone: normal, no fasciculations. Motor:  muscle strength 5/5 throughout Sensation:  Pinprick, temperature and vibratory sensation  intact. Deep Tendon Reflexes:  2+ throughout,  toes downgoing.   Finger to nose testing:  Without dysmetria.   Heel to shin:  Without dysmetria.   Gait:  Normal station and stride.  Romberg negative.    Thank you for allowing me to take part in the care of this patient.  Metta Clines, DO  CC: ***

## 2022-06-09 ENCOUNTER — Encounter: Payer: Self-pay | Admitting: Neurology

## 2022-06-09 ENCOUNTER — Ambulatory Visit: Payer: PRIVATE HEALTH INSURANCE | Admitting: Neurology

## 2022-06-09 DIAGNOSIS — Z029 Encounter for administrative examinations, unspecified: Secondary | ICD-10-CM

## 2022-06-21 ENCOUNTER — Ambulatory Visit: Payer: Self-pay | Admitting: *Deleted

## 2022-06-21 ENCOUNTER — Telehealth: Payer: Self-pay | Admitting: Family

## 2022-06-21 NOTE — Telephone Encounter (Signed)
Summary: Chronic Bronchitis, requesting Abx   Pt has chronic bronchitis and needs a prescription called in for her today   She normally takes a taper prednisone, and an antibiotic along with a diflucan.   [facility] ([address]), [place]  [address] [place] ([place]) [place] [address]  Phone: [phone] Fax: [phone]    Best contact: [phone]       Called patient to review sx. No answer, LVMTCB [phone].

## 2022-06-21 NOTE — Telephone Encounter (Signed)
     Chief Complaint: Pt. States she has chronic bronchitis. Started coughing 3 days ago."She usually calls in antibiotic,Prednisone and Diflucan so I don't get a yeast infection." Symptoms: SOB with coughing, mild wheezing. Frequency: 3 days ago Pertinent Negatives: Patient denies  Disposition: '[]'$ ED /'[]'$ Urgent Care (no appt availability in office) / '[]'$ Appointment(In office/virtual)/ '[]'$  [facility]/ '[]'$ Home Care/ '[]'$ Refused Recommended Disposition /'[]'$  Mobile Bus/ '[x]'$  Follow-up with PCP Additional Notes: Please advise pt.  Answer Assessment - Initial Assessment Questions 1. ONSET: "When did the cough begin?"      3 days 2. SEVERITY: "How bad is the cough today?"      Severe 3. SPUTUM: "Describe the color of your sputum" (none, dry cough; clear, white, yellow, green)     White 4. HEMOPTYSIS: "Are you coughing up any blood?" If so ask: "How much?" (flecks, streaks, tablespoons, etc.)     No 5. DIFFICULTY BREATHING: "Are you having difficulty breathing?" If Yes, ask: "How bad is it?" (e.g., mild, moderate, severe)    - MILD: No SOB at rest, mild SOB with walking, speaks normally in sentences, can lie down, no retractions, pulse < 100.    - MODERATE: SOB at rest, SOB with minimal exertion and prefers to sit, cannot lie down flat, speaks in phrases, mild retractions, audible wheezing, pulse 100-120.    - SEVERE: Very SOB at rest, speaks in single words, struggling to breathe, sitting hunched forward, retractions, pulse > 120      SOB with coughing 6. FEVER: "Do you have a fever?" If Yes, ask: "What is your temperature, how was it measured, and when did it start?"     No 7. CARDIAC HISTORY: "Do you have any history of heart disease?" (e.g., heart attack, congestive heart failure)      No 8. LUNG HISTORY: "Do you have any history of lung disease?"  (e.g., pulmonary embolus, asthma, emphysema)     Bronchitis 9. PE RISK FACTORS: "Do you have a history of blood clots?" (or:  recent major surgery, recent prolonged travel, bedridden)     No 10. OTHER SYMPTOMS: "Do you have any other symptoms?" (e.g., runny nose, wheezing, chest pain)       Wheezing 11. PREGNANCY: "Is there any chance you are pregnant?" "When was your last menstrual period?"       No 12. TRAVEL: "Have you traveled out of the country in the last month?" (e.g., travel history, exposures)       No  Protocols used: Cough - Acute Productive-A-AH

## 2022-06-21 NOTE — Telephone Encounter (Signed)
Pt called in to follow up on triage message that was sent in regards to having medication sent in to pharmacy. Please see NT note.    Pharmacy: [facility] [address] ([address]), [place]  [address] [place] ([place]) [place] [address]  Phone: [phone] Fax: [phone]     CB: [phone]

## 2022-06-21 NOTE — Telephone Encounter (Signed)
Pt is established w/Pulmonology she will need to contact them in regards to bronchitis flare.

## 2022-06-22 ENCOUNTER — Telehealth: Payer: Self-pay | Admitting: Pulmonary Disease

## 2022-06-22 MED ORDER — PREDNISONE 10 MG PO TABS: ORAL_TABLET | ORAL | 0 refills | Status: AC

## 2022-06-22 MED ORDER — AZITHROMYCIN 250 MG PO TABS: 250.0000 mg | ORAL_TABLET | Freq: Every day | ORAL | 0 refills | Status: DC

## 2022-06-22 MED ORDER — FLUCONAZOLE 50 MG PO TABS: 50.0000 mg | ORAL_TABLET | Freq: Every day | ORAL | 0 refills | Status: DC

## 2022-06-22 NOTE — Telephone Encounter (Signed)
Pt checking status of rx request originally made w/ NT  Advised pt of the note below  Pt states she mentioned in previous call that her pulmonologist is not in the office  Pt is requesting a cb to discuss request further  Please fu w/ pt

## 2022-06-22 NOTE — Telephone Encounter (Signed)
OK to send in:  Prednisone 40 mg x 2d, 30 mg x 2d, 20 mg x 2d and 10 mg x 2d Azithromycin 500 mg x 1 day followed by 250 mg daily x 4 days Fluconazole 150 mg once if developing symptoms of yeast infection

## 2022-06-22 NOTE — Telephone Encounter (Signed)
Called and spoke with patient. She stated that she believes she has developed another bronchitis flare up. She travelled to [facility] this weekend and was around a lot of mold for a few minutes. Once she got back home she noticed that her cough had increased. She has a productive cough with thick white phlegm. Increased wheezing and SOB. She denied any fever or body aches.   Did confirm that she is still using her Spiriva 2.19mg daily and her albuterol only as needed.   She is requesting to have an abx and prednisone sent in for her. She is also requesting Diflucan if an abx is sent in due to her being prone to yeast infections after taking an abx.   [facility]   Dr. ELoanne Drilling can you please advise? Thanks!

## 2022-06-22 NOTE — Telephone Encounter (Signed)
Per chart review there was no mention that previous contact to Pulmonology was made, further review shows pt contacted [facility] through Mychart '@10'$ :36am, Pulmonary triage spoke w/pt, pending response from provider

## 2022-06-22 NOTE — Telephone Encounter (Signed)
Called and spoke to patient and went over instructions from Dr Loanne Drilling. Patient verbalized understanding. Nothing further needed

## 2022-07-02 ENCOUNTER — Ambulatory Visit
Admission: EM | Admit: 2022-07-02 | Discharge: 2022-07-02 | Disposition: A | Discharge status: Home / Self Care | Payer: PRIVATE HEALTH INSURANCE | Attending: Physician Assistant | Admitting: Physician Assistant

## 2022-07-02 DIAGNOSIS — G44209 Tension-type headache, unspecified, not intractable: Secondary | ICD-10-CM

## 2022-07-02 DIAGNOSIS — I10 Essential (primary) hypertension: Secondary | ICD-10-CM

## 2022-07-02 HISTORY — DX: Essential (primary) hypertension: I10

## 2022-07-02 MED ORDER — BACLOFEN 10 MG PO TABS: 10.0000 mg | ORAL_TABLET | Freq: Every evening | ORAL | 0 refills | Status: DC | PRN

## 2022-07-02 NOTE — ED Triage Notes (Signed)
Pt c/o headache onset this morning. States she checked her BP and it was systolic in the 834'M. Reports not taking her amlodipine x 3 days b/c she ran out.

## 2022-07-02 NOTE — Discharge Instructions (Signed)
Please restart your amlodipine as previously prescribed.  Use baclofen at night.  This will make you sleepy do not drive or drink alcohol with taking it.  Make sure that you rest and drink plenty of fluid.  If your symptoms or not improving follow-up with your primary care provider.  If you have any worsening symptoms including severe headache, weakness, nausea, vomiting, vision change, difficulty speaking you need to go to the emergency room as we discussed.

## 2022-07-02 NOTE — ED Provider Notes (Signed)
[facility]    CSN: [id number] Arrival date & time: 07/02/22  1629      History   Chief Complaint Chief Complaint  Patient presents with   Hypertension    HPI [name] is a 51 y.o. female.   Patient presents today with a 3-day history of right-sided headache.  She does have a history of migraines and tension headaches with similar presentation.  She reports currently pain is rated 6 on a 0-10 pain scale, described as aching, no aggravating leaving factors identified.  She denies any visual disturbance, photosensitivity, phono sensitivity, nausea, vomiting, weakness, dysarthria.  She denies any head injury or medication change.  She does have a history of essential hypertension and has been without her amlodipine for the past several days.  When she noticed the headache was not improving she took her blood pressure which was noted to be elevated prompting evaluation today.  She did take a partial dose of her sisters [place] blood pressure medication but this has not provided any significant relief of symptoms.  She has tried Excedrin Migraine with improvement but not resolution.  She denies any recent illness or additional symptoms.    Past Medical History:  Diagnosis Date   Anemia    Anxiety    Arthritis    right knee, back   COPD (chronic obstructive pulmonary disease) (HCC)    GERD (gastroesophageal reflux disease)    Headache(784.0)    History of blood transfusion    Hx; of in 1991 after delivery   History of TIAs    Hypertension    Numbness    Right hand   Pneumonia    Stroke ([place]) 05/2013    Patient Active Problem List   Diagnosis Date Noted   Asthmatic bronchitis with acute exacerbation 04/09/2022   Chronic cough 01/27/2022   Right thyroid nodule 01/07/2022   Essential hypertension 01/05/2022   Migraine 01/05/2022   Thyroid nodule greater than or equal to 1.5 cm in diameter incidentally noted on imaging study 12/01/2021   Grief 02/06/2021    Non-intractable vomiting 02/06/2021   Arthritis of right knee    S/P knee replacement 01/06/2021   Morning headache 06/02/2017   Leiomyoma of body of uterus 12/24/2016   Prediabetes 11/11/2016   Pap smear abnormality of cervix with ASCUS favoring benign 01/01/2016   COPD (chronic obstructive pulmonary disease) ([place]) 11/18/2015   GERD (gastroesophageal reflux disease) 11/18/2015   Abnormal imaging of thyroid 11/04/2015   COPD exacerbation ([place]) 11/01/2015   Elevated d-dimer 11/01/2015   IDA (iron deficiency anemia) 09/04/2015   Patellofemoral disorder 04/04/2015   Insomnia 04/09/2014   S/P cervical spinal fusion 03/06/2014   Class 2 severe obesity due to excess calories with serious comorbidity and body mass index (BMI) of 37.0 to 37.9 in adult ([place]) 02/22/2014   Radicular pain in right arm 01/11/2014   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   Other and unspecified hyperlipidemia 08/24/2013   History of CVA (cerebrovascular accident) 05/18/2013   Tobacco abuse, 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    Past Surgical History:  Procedure Laterality Date   ANTERIOR CERVICAL DECOMP/DISCECTOMY FUSION N/A 03/06/2014   Procedure: ANTERIOR CERVICAL DECOMPRESSION/DISCECTOMY FUSION 2 LEVELS four/five, five/six;  Surgeon: Eustace Moore, MD;  Location: [facility] NEURO ORS;  Service: Neurosurgery;  Laterality: N/A;   CESAREAN SECTION     x 1 with 5th pregnancy   CHOLECYSTECTOMY     KNEE ARTHROSCOPY Right 2015   TEE WITHOUT CARDIOVERSION N/A 05/21/2013  Procedure: TRANSESOPHAGEAL ECHOCARDIOGRAM (TEE);  Surgeon: Sueanne Margarita, MD;  Location: [place];  Service: Cardiovascular;  Laterality: N/A;   TOTAL KNEE ARTHROPLASTY Right 01/06/2021   TOTAL KNEE ARTHROPLASTY Right 01/06/2021   Procedure: RIGHT TOTAL KNEE ARTHROPLASTY;  Surgeon: Meredith Pel, MD;  Location: [place];  Service: Orthopedics;  Laterality: Right;   TUBAL LIGATION      OB History   No obstetric history on file.      Home  Medications    Prior to Admission medications   Medication Sig Start Date End Date Taking? Authorizing Provider  baclofen (LIORESAL) 10 MG tablet Take 1 tablet (10 mg total) by mouth at bedtime as needed for muscle spasms. 07/02/22  Yes Abdirahim Flavell K, PA-C  albuterol (PROVENTIL) (2.5 MG/3ML) 0.083% nebulizer solution Take 3 mLs by nebulization every 6 (six) hours as needed for wheezing or shortness of breath. 05/19/21 09/22/21  Camillia Herter, NP  albuterol (VENTOLIN HFA) 108 (90 Base) MCG/ACT inhaler Inhale 2 puffs into the lungs every 6 (six) hours as needed for wheezing or shortness of breath. 04/09/22   Margaretha Seeds, MD  amLODipine (NORVASC) 5 MG tablet Take 1 tablet (5 mg total) by mouth daily. 06/03/22 10/01/22  Camillia Herter, NP  escitalopram (LEXAPRO) 10 MG tablet Take 1 tablet (10 mg total) by mouth at bedtime. 06/03/22 10/01/22  Camillia Herter, NP  ferrous sulfate 325 (65 FE) MG tablet Take 1 tablet (325 mg total) by mouth every other day. 01/05/22   Mayers, Cari S, PA-C  gabapentin (NEURONTIN) 100 MG capsule Take 1 capsule (100 mg total) by mouth 4 (four) times daily. 06/03/22   Camillia Herter, NP  ipratropium (ATROVENT) 0.06 % nasal spray PLACE 2 SPRAYS INTO BOTH NOSTRILS 4 TIMES DAILY. 01/27/22 01/27/23  Margaretha Seeds, MD  omeprazole (PRILOSEC) 40 MG capsule Take 1 capsule (40 mg total) by mouth daily. 06/03/22 11/30/22  Camillia Herter, NP  Tiotropium Bromide Monohydrate (SPIRIVA RESPIMAT) 2.5 MCG/ACT AERS Inhale 2 puffs into the lungs daily. 03/15/22   Margaretha Seeds, MD    Family History Family History  Problem Relation Age of Onset   Renal Disease Father        dialysis   Hypertension Father    Heart disease Mother    Heart disease Maternal Grandfather    Asthma Sister    Asthma Maternal Aunt     Social History Social History   Tobacco Use   Smoking status: Former    Packs/day: 0.50    Years: 26.00    Total pack years: 13.00    Types: Cigarettes    Quit date:  10/09/2015    Years since quitting: 6.7    Passive exposure: Past   Smokeless tobacco: Former  Scientific laboratory technician Use: Never used  Substance Use Topics   Alcohol use: Not Currently    Alcohol/week: 0.0 standard drinks of alcohol    Comment: occ   Drug use: No     Allergies   Shellfish allergy, Amoxicillin, and Latex   Review of Systems Review of Systems  Constitutional:  Positive for activity change. Negative for appetite change, fatigue and fever.  Eyes:  Negative for photophobia and visual disturbance.  Respiratory:  Negative for cough and shortness of breath.   Cardiovascular:  Negative for chest pain.  Gastrointestinal:  Negative for abdominal pain, diarrhea, nausea and vomiting.  Neurological:  Positive for headaches. Negative for dizziness, syncope, facial asymmetry, speech  difficulty, weakness, light-headedness and numbness.     Physical Exam Triage Vital Signs ED Triage Vitals  Enc Vitals Group     BP 07/02/22 1736 (!) 162/104     Pulse Rate 07/02/22 1736 66     Resp 07/02/22 1736 16     Temp 07/02/22 1736 98.1 F (36.7 C)     Temp Source 07/02/22 1736 Oral     SpO2 07/02/22 1736 97 %     Weight --      Height --      Head Circumference --      Peak Flow --      Pain Score 07/02/22 1737 6     Pain Loc --      Pain Edu? --      Excl. in [place]? --    No data found.  Updated Vital Signs BP 135/88   Pulse 66   Temp 98.1 F (36.7 C) (Oral)   Resp 16   SpO2 96%   Visual Acuity Right Eye Distance:   Left Eye Distance:   Bilateral Distance:    Right Eye Near:   Left Eye Near:    Bilateral Near:     Physical Exam Vitals reviewed.  Constitutional:      General: She is awake. She is not in acute distress.    Appearance: Normal appearance. She is well-developed. She is not ill-appearing.     Comments: Very pleasant female appears stated age in no acute distress sitting comfortably in exam room  HENT:     Head: Normocephalic and atraumatic. No  raccoon eyes, Battle's sign or contusion.     Right Ear: Tympanic membrane, ear canal and external ear normal. No hemotympanum.     Left Ear: Tympanic membrane, ear canal and external ear normal. No hemotympanum.     Nose: Nose normal.     Mouth/Throat:     Tongue: Tongue does not deviate from midline.     Pharynx: Uvula midline. No oropharyngeal exudate or posterior oropharyngeal erythema.  Eyes:     Extraocular Movements: Extraocular movements intact.     Conjunctiva/sclera: Conjunctivae normal.     Pupils: Pupils are equal, round, and reactive to light.  Cardiovascular:     Rate and Rhythm: Normal rate and regular rhythm.     Heart sounds: Normal heart sounds, S1 normal and S2 normal. No murmur heard. Pulmonary:     Effort: Pulmonary effort is normal.     Breath sounds: Normal breath sounds. No wheezing, rhonchi or rales.     Comments: Clear to auscultation bilaterally Abdominal:     General: Bowel sounds are normal.     Palpations: Abdomen is soft.     Tenderness: There is no abdominal tenderness.  Musculoskeletal:     Cervical back: Normal range of motion and neck supple. No spinous process tenderness or muscular tenderness.     Comments: Strength 5/5 bilateral upper and lower extremities  Lymphadenopathy:     Head:     Right side of head: No submental, submandibular or tonsillar adenopathy.     Left side of head: No submental, submandibular or tonsillar adenopathy.  Neurological:     General: No focal deficit present.     Mental Status: She is alert and oriented to person, place, and time.     Cranial Nerves: Cranial nerves 2-12 are intact.     Motor: Motor function is intact.     Coordination: Coordination is intact.     Gait:  Gait is intact.     Comments: No focal neurological defect on exam.  Psychiatric:        Behavior: Behavior is cooperative.      UC Treatments / Results  Labs (all labs ordered are listed, but only abnormal results are displayed) Labs  Reviewed - No data to display  EKG   Radiology No results found.  Procedures Procedures (including critical care time)  Medications Ordered in UC Medications - No data to display  Initial Impression / Assessment and Plan / UC Course  I have reviewed the triage vital signs and the nursing notes.  Pertinent labs & imaging results that were available during my care of the patient were reviewed by me and considered in my medical decision making (see chart for details).     Vital signs and physical exam reassuring today; no indication for emergent evaluation or imaging.  Blood pressure today is 135/88.  Discussed that her headaches could be related to uncontrolled blood pressure and recommended that she restart her amlodipine as previously prescribed.  Offered to send this into the pharmacy but she is confident that is available at the pharmacy and she intends to pick this up immediately after our visit.  Recommended that she limit use of over-the-counter medications to prevent rebound headaches.  We will start baclofen for pain relief with instruction not to drive or drink alcohol while taking this medication as drowsiness is a common side effect.  She is to push fluids and eat small frequent meals.  Recommend close follow-up with her primary care for blood pressure recheck and to ensure symptoms are improving.  If she has any worsening symptoms including severe headache, weakness, nausea, vomiting, visual disturbance, dysarthria she needs to be seen immediately.  Strict return precautions given to which she expressed understanding.  Work excuse note provided.  Final Clinical Impressions(s) / UC Diagnoses   Final diagnoses:  Tension headache  Essential hypertension     Discharge Instructions      Please restart your amlodipine as previously prescribed.  Use baclofen at night.  This will make you sleepy do not drive or drink alcohol with taking it.  Make sure that you rest and drink  plenty of fluid.  If your symptoms or not improving follow-up with your primary care provider.  If you have any worsening symptoms including severe headache, weakness, nausea, vomiting, vision change, difficulty speaking you need to go to the emergency room as we discussed.     ED Prescriptions     Medication Sig Dispense Auth. Provider   baclofen (LIORESAL) 10 MG tablet Take 1 tablet (10 mg total) by mouth at bedtime as needed for muscle spasms. 5 each Ivi Griffith, Derry Skill, PA-C      PDMP not reviewed this encounter.   Terrilee Croak, PA-C 07/02/22 1805

## 2022-07-13 ENCOUNTER — Ambulatory Visit: Payer: PRIVATE HEALTH INSURANCE | Admitting: Pulmonary Disease

## 2022-07-30 ENCOUNTER — Ambulatory Visit: Payer: PRIVATE HEALTH INSURANCE | Admitting: Pulmonary Disease

## 2022-09-02 ENCOUNTER — Ambulatory Visit: Payer: PRIVATE HEALTH INSURANCE | Admitting: Physician Assistant

## 2022-09-03 ENCOUNTER — Ambulatory Visit (INDEPENDENT_AMBULATORY_CARE_PROVIDER_SITE_OTHER): Payer: PRIVATE HEALTH INSURANCE

## 2022-09-03 ENCOUNTER — Ambulatory Visit (INDEPENDENT_AMBULATORY_CARE_PROVIDER_SITE_OTHER): Payer: PRIVATE HEALTH INSURANCE | Admitting: Physician Assistant

## 2022-09-03 DIAGNOSIS — M25552 Pain in left hip: Secondary | ICD-10-CM

## 2022-09-03 DIAGNOSIS — M7062 Trochanteric bursitis, left hip: Secondary | ICD-10-CM | POA: Insufficient documentation

## 2022-09-03 MED ORDER — BUPIVACAINE HCL 0.25 % IJ SOLN
2.0000 mL | INTRAMUSCULAR | Status: AC | PRN
  Administered 2022-09-03: 2 mL via INTRA_ARTICULAR

## 2022-09-03 MED ORDER — LIDOCAINE HCL 1 % IJ SOLN
2.0000 mL | INTRAMUSCULAR | Status: AC | PRN
  Administered 2022-09-03: 2 mL

## 2022-09-03 MED ORDER — METHYLPREDNISOLONE ACETATE 40 MG/ML IJ SUSP
80.0000 mg | INTRAMUSCULAR | Status: AC | PRN
  Administered 2022-09-03: 80 mg via INTRA_ARTICULAR

## 2022-09-03 NOTE — Progress Notes (Signed)
Office Visit Note   Patient: [name]           Date of Birth: [birth year]           MRN: [id number] Visit Date: 09/03/2022              Requested by: Camillia Herter, NP [address] [place],  [place] [address] PCP: Camillia Herter, NP  Chief Complaint  Patient presents with  . Left Hip - Pain      HPI: [name] comes in today with a chief complaint of left lateral hip pain.  Denies any injuries but this has been going on for a while.  Focal area is over the lateral side of the left hip.  Denies really any groin pain.  Sometimes has posterior buttock pain.  But has a specific spot that makes it difficult for her to sleep on it at night.  She has tried ibuprofen without much relief.  She also has a history of epidural steroid injections with her cervical spine with Dr. [name].  She is done well and wondering if she could have a new referral as she has not had an injection in a while and her left-sided symptoms are returning  Assessment & Plan: Visit Diagnoses:  1. Pain in left hip   2. Trochanteric bursitis, left hip     Plan: Certainly she could have an element of lumbar radiculopathy though her exam she is neurologically intact and her x-rays are reassuring.  She is focally tender over the trochanteric bursa.  I will go forward and inject this today.  If she does not get significant relief in about a week to 10 days she will contact me for further recommendations  Follow-Up Instructions: Return if symptoms worsen or fail to improve.   Ortho Exam  Patient is alert, oriented, no adenopathy, well-dressed, normal affect, normal respiratory effort. Examination of her she has minimal pain with forward flexion extension.  She has intact sensation distally.  Strength is 5 out of 5 with resisted flexion extension of her legs flexion plantar and dorsal of her ankles.  Straight leg raise reproduces some pain mostly on the lateral side of her hip.  She is focally tender  over the left hip trochanteric bursa  Imaging: XR Lumbar Spine 2-3 Views  Result Date: 09/03/2022 2 view radiographs of her lumbar spine were reviewed today.  No listhesis.  She does have well-preserved joint spacing between the vertebral bodies.  No significant degenerative changes no fractures  XR Pelvis 1-2 Views  Result Date: 09/03/2022 Radiographs AP pelvis was reviewed today.  No acute osseous injuries.  She has femoral heads well reduced in the acetabulum without any significant degenerative changes fairly congruent joint spacing  No images are attached to the encounter.  Labs: Lab Results  Component Value Date   HGBA1C 5.4 02/12/2020   HGBA1C 5.7 11/11/2016   HGBA1C 5.9 09/16/2016   ESRSEDRATE 18 05/19/2013   REPTSTATUS 01/01/2021 FINAL 12/31/2020   GRAMSTAIN CYTOSPIN NO WBC SEEN NO ORGANISMS SEEN 07/14/2009   CULT  12/31/2020    NO GROWTH Performed at [facility], [place] [address] [place], [place] [address]    LABORGA NO GROWTH 12/17/2016     Lab Results  Component Value Date   ALBUMIN 4.0 01/02/2022   ALBUMIN 3.8 02/09/2021   ALBUMIN 3.9 11/27/2020    No results found for: "MG" Lab Results  Component Value Date   VD25OH 12 (  L) 01/07/2014    No results found for: "PREALBUMIN"    Latest Ref Rng & Units 01/02/2022    4:35 AM 02/09/2021    3:15 PM 02/09/2021    3:14 PM  CBC EXTENDED  WBC 4.0 - 10.5 K/uL 7.9  6.7    RBC 3.87 - 5.11 MIL/uL 5.34  4.78  4.84   Hemoglobin 12.0 - 15.0 g/dL 12.6  12.2    HCT 36.0 - 46.0 % 40.5  38.9    Platelets 150 - 400 K/uL 310  248    NEUT# 1.7 - 7.7 K/uL 5.2  4.6    Lymph# 0.7 - 4.0 K/uL 2.4  1.6       There is no height or weight on file to calculate BMI.  Orders:  Orders Placed This Encounter  Procedures  . XR Lumbar Spine 2-3 Views  . XR Pelvis 1-2 Views  . Ambulatory referral to Physical Medicine Rehab   No orders of the defined types were placed in this encounter.    Procedures: Large Joint Inj: L  greater trochanter on 09/03/2022 8:59 AM Indications: pain and diagnostic evaluation Details: 22 G 1.5 in and 3.5 in needle, lateral approach  Arthrogram: No  Medications: 2 mL lidocaine 1 %; 80 mg methylPREDNISolone acetate 40 MG/ML; 2 mL bupivacaine 0.25 % Outcome: tolerated well, no immediate complications Procedure, treatment alternatives, risks and benefits explained, specific risks discussed. Consent was given by the patient.     Clinical Data: No additional findings.  ROS:  All other systems negative, except as noted in the HPI. Review of Systems  Objective: Vital Signs: There were no vitals taken for this visit.  Specialty Comments:  MRI CERVICAL SPINE WITHOUT CONTRAST   TECHNIQUE: Multiplanar, multisequence MR imaging of the cervical spine was performed. No intravenous contrast was administered.   COMPARISON:  Cervical spine MRI 01/18/2014. Cervical spine radiographs 03/15/2022.   FINDINGS: Alignment: Chronic straightening of cervical lordosis, less reversal of lordosis compared to the preoperative MRI in 2015.   Vertebrae: ACDF hardware artifact now C4-C5 and C5-C6. Mild degenerative endplate marrow edema eccentric to the left at the adjacent C6-C7 segment (series 4, image 11). Background bone marrow signal within normal limits. No other No acute osseous abnormality identified.   Cord: No spinal cord signal abnormality despite degenerative cord mass effect detailed below. Negative visible upper thoracic canal and cord.   Posterior Fossa, vertebral arteries, paraspinal tissues: Cervicomedullary junction is within normal limits. Partially empty sella is chronic. Negative visible posterior fossa. Preserved major vascular flow voids in the neck. Right vertebral artery appears dominant as in 2015. Negative visible neck soft tissues and lung apices.   Disc levels:   C2-C3: Chronic disc bulging and endplate spurring eccentric to the right with moderate  right side facet and ligament flavum hypertrophy. Borderline spinal stenosis now at this level. Moderate to severe right C3 foraminal stenosis has also progressed.   C3-C4: Progressed disc space loss with circumferential disc bulge, anterior endplate spurring, and lobulated posterior and slightly cephalad extrusion of disc (greater on the right series 7, image 7). Mild to moderate facet and ligament flavum hypertrophy has increased on the right. New mild spinal stenosis and mild ventral cord mass effect (same image). Mild to moderate bilateral C4 foraminal stenosis appears increased on the right.   C4-C5:  Interval ACDF. Resolved stenosis.   C5-C6: Interval ACDF. No stenosis. Residual endplate spurring on the left.   C6-C7: Interval disc space loss. Circumferential disc bulge and  broad-based posterior component of disc (series 7, image 21). New mild spinal stenosis and ventral cord mass effect. Mild facet and ligament flavum hypertrophy. Mild to moderate bilateral C7 foraminal stenosis is new.   C7-T1:  Stable mild facet hypertrophy. No stenosis.   IMPRESSION: 1. ACDF C4-C5 and C5-C6 since a 2015 MRI with resolved stenosis at those levels.   2. But progressed degeneration at both adjacent segments: C3-C4 and C6-C7. New mild spinal stenosis AND spinal cord mass effect at both levels. No cord signal abnormality. Up to moderate bilateral foraminal stenosis at both levels.   3. Progressed C2-C3 degeneration with new borderline spinal stenosis and increased moderate to severe right C3 foraminal stenosis.     Electronically Signed   By: Genevie Ann M.D.   On: 03/30/2022 15:42  PMFS History: Patient Active Problem List   Diagnosis Date Noted  . Trochanteric bursitis, left hip 09/03/2022  . Asthmatic bronchitis with acute exacerbation 04/09/2022  . Chronic cough 01/27/2022  . Right thyroid nodule 01/07/2022  . Essential hypertension 01/05/2022  . Migraine 01/05/2022  . Thyroid  nodule greater than or equal to 1.5 cm in diameter incidentally noted on imaging study 12/01/2021  . Grief 02/06/2021  . Non-intractable vomiting 02/06/2021  . Arthritis of right knee   . S/P knee replacement 01/06/2021  . Morning headache 06/02/2017  . Leiomyoma of body of uterus 12/24/2016  . Prediabetes 11/11/2016  . Pap smear abnormality of cervix with ASCUS favoring benign 01/01/2016  . COPD (chronic obstructive pulmonary disease) ([place]) 11/18/2015  . GERD (gastroesophageal reflux disease) 11/18/2015  . Abnormal imaging of thyroid 11/04/2015  . COPD exacerbation ([place]) 11/01/2015  . Elevated d-dimer 11/01/2015  . IDA (iron deficiency anemia) 09/04/2015  . 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 Insomnia 04/09/2014  . S/P cervical spinal fusion 03/06/2014  . Class 2 severe obesity due to excess calories with serious comorbidity and body mass index (BMI) of 37.0 to 37.9 in adult ([place]) 02/22/2014  . Radicular pain in right arm 01/11/2014  . 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 Other and unspecified hyperlipidemia 08/24/2013  . History of CVA (cerebrovascular accident) 05/18/2013  . 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 Anemia   . Anxiety   . Arthritis    right knee, back  . COPD (chronic obstructive pulmonary disease) ([place])   . GERD (gastroesophageal reflux disease)   . Headache(784.0)   . History of blood transfusion    Hx; of in 1991 after delivery  . History of TIAs   . Hypertension   . Numbness    Right hand  . Pneumonia   . Stroke [facility]) 05/2013    Family History  Problem Relation Age of Onset  . Renal Disease Father        dialysis  . Hypertension Father   . Heart disease Mother   . Heart disease Maternal Grandfather   . Asthma Sister   . Asthma Maternal Aunt     Past Surgical History:  Procedure Laterality Date  . ANTERIOR CERVICAL DECOMP/DISCECTOMY FUSION N/A 03/06/2014   Procedure: ANTERIOR CERVICAL  DECOMPRESSION/DISCECTOMY FUSION 2 LEVELS four/five, five/six;  Surgeon: Eustace Moore, MD;  Location: [facility] NEURO ORS;  Service: Neurosurgery;  Laterality: N/A;  . CESAREAN SECTION     x 1 with 5th pregnancy  . CHOLECYSTECTOMY    . KNEE ARTHROSCOPY Right 2015  . TEE WITHOUT CARDIOVERSION N/A 05/21/2013   Procedure: TRANSESOPHAGEAL ECHOCARDIOGRAM (  TEE);  Surgeon: Sueanne Margarita, MD;  Location: [place];  Service: Cardiovascular;  Laterality: N/A;  . TOTAL KNEE ARTHROPLASTY Right 01/06/2021  . TOTAL KNEE ARTHROPLASTY Right 01/06/2021   Procedure: RIGHT TOTAL KNEE ARTHROPLASTY;  Surgeon: Meredith Pel, MD;  Location: [facility];  Service: Orthopedics;  Laterality: Right;  . TUBAL LIGATION     Social History   Occupational History  . Occupation: CNA    Employer: [employer]  Tobacco Use  . Smoking status: Former    Packs/day: 0.50    Years: 26.00    Total pack years: 13.00    Types: Cigarettes    Quit date: 10/09/2015    Years since quitting: 6.9    Passive exposure: Past  . Smokeless tobacco: Former  Media planner  . Vaping Use: Never used  Substance and Sexual Activity  . Alcohol use: Not Currently    Alcohol/week: 0.0 standard drinks of alcohol    Comment: occ  . Drug use: No  . Sexual activity: Not Currently    Birth control/protection: Surgical

## 2022-09-16 ENCOUNTER — Telehealth: Payer: Self-pay | Admitting: Pulmonary Disease

## 2022-09-16 ENCOUNTER — Encounter: Payer: PRIVATE HEALTH INSURANCE | Admitting: Physical Medicine and Rehabilitation

## 2022-09-16 ENCOUNTER — Other Ambulatory Visit: Payer: Self-pay | Admitting: Physical Medicine and Rehabilitation

## 2022-09-16 ENCOUNTER — Telehealth: Payer: Self-pay | Admitting: Physical Medicine and Rehabilitation

## 2022-09-16 MED ORDER — DIAZEPAM 5 MG PO TABS: ORAL_TABLET | ORAL | 0 refills | Status: DC

## 2022-09-16 MED ORDER — PREDNISONE 20 MG PO TABS: 20.0000 mg | ORAL_TABLET | Freq: Every day | ORAL | 0 refills | Status: DC

## 2022-09-16 NOTE — Telephone Encounter (Signed)
IC notified patient

## 2022-09-16 NOTE — Telephone Encounter (Signed)
Spoke with patient and rescheduled appointment for 09/20/22. She states she needs pre procedure Valium for injection

## 2022-09-16 NOTE — Telephone Encounter (Signed)
We will call in prednisone 20 mg daily for the next 5 days.    This is her third flare with prednisone x 3  in the last 4 months.  Please make sure she keeps her follow-up appointment next week with Dr. Loanne Drilling as planned.  If symptoms are not improving or worsen she will need to seek emergency room care.  She also can be placed on the schedule tomorrow for an acute work in if she would like to come in sooner Please contact office for sooner follow up if symptoms do not improve or worsen or seek emergency care

## 2022-09-16 NOTE — Telephone Encounter (Signed)
Patient called needing to R/S her appointment. The number to contact patient is [phone]

## 2022-09-16 NOTE — Telephone Encounter (Signed)
Called and spoke with pt who states she began coughing about the past 3 days ago. Pt states that she has been having SOB from all the coughing she has done. Denies any complaints of wheezing.   Pt states she has been coughing so hard to the point that she is vomiting the mucus which is white in color.  Pt has been using her rescue inhaler at least 3 times a day and has had to do at least 2 nebulizer treatments a day which she states will take the edge away for just a short period of time before symptoms begin to escalate again.  Pt has tried alka seltzer cold which has helped some.  Due to her symptoms, pt wants to know if there is anything that could be recommended.  Pt does have an upcoming appt scheduled 11/17 but does want something prescribed now to help out with her symptoms.  Tammy, please advise.

## 2022-09-16 NOTE — Telephone Encounter (Signed)
Called and spoke with pt letting her know recs per TP and she verbalized understanding. Rx for prednisone has been sent to preferred pharmacy for pt. Nothing further needed.

## 2022-09-20 ENCOUNTER — Ambulatory Visit: Payer: Self-pay

## 2022-09-20 ENCOUNTER — Ambulatory Visit (INDEPENDENT_AMBULATORY_CARE_PROVIDER_SITE_OTHER): Payer: PRIVATE HEALTH INSURANCE | Admitting: Physical Medicine and Rehabilitation

## 2022-09-20 VITALS — BP 129/82 | HR 89

## 2022-09-20 DIAGNOSIS — M5412 Radiculopathy, cervical region: Secondary | ICD-10-CM | POA: Diagnosis not present

## 2022-09-20 MED ORDER — METHYLPREDNISOLONE ACETATE 80 MG/ML IJ SUSP
40.0000 mg | Freq: Once | INTRAMUSCULAR | Status: AC
  Administered 2022-09-20: 40 mg

## 2022-09-20 NOTE — Progress Notes (Signed)
Numeric Pain Rating Scale and Functional Assessment Average Pain 5   In the last MONTH (on 0-10 scale) has pain interfered with the following?  1. General activity like being  able to carry out your everyday physical activities such as walking, climbing stairs, carrying groceries, or moving a chair?  Rating(5)   +Driver, -BT, -Dye Allergies.  Pain on left side of neck that radiates into shoulder

## 2022-09-20 NOTE — Patient Instructions (Signed)

## 2022-09-21 NOTE — Progress Notes (Signed)
Erroneous encounter-disregard

## 2022-09-24 ENCOUNTER — Other Ambulatory Visit (HOSPITAL_BASED_OUTPATIENT_CLINIC_OR_DEPARTMENT_OTHER): Payer: Self-pay

## 2022-09-24 ENCOUNTER — Encounter (HOSPITAL_BASED_OUTPATIENT_CLINIC_OR_DEPARTMENT_OTHER): Payer: Self-pay | Admitting: Pulmonary Disease

## 2022-09-24 ENCOUNTER — Ambulatory Visit (INDEPENDENT_AMBULATORY_CARE_PROVIDER_SITE_OTHER): Payer: PRIVATE HEALTH INSURANCE | Admitting: Pulmonary Disease

## 2022-09-24 DIAGNOSIS — J449 Chronic obstructive pulmonary disease, unspecified: Secondary | ICD-10-CM

## 2022-09-24 MED ORDER — COMIRNATY 30 MCG/0.3ML IM SUSY
PREFILLED_SYRINGE | INTRAMUSCULAR | 0 refills | Status: DC
  Filled 2022-09-24: qty 0.3, 1d supply, fill #0

## 2022-09-24 MED ORDER — ALBUTEROL SULFATE HFA 108 (90 BASE) MCG/ACT IN AERS: 2.0000 | INHALATION_SPRAY | Freq: Four times a day (QID) | RESPIRATORY_TRACT | 5 refills | Status: DC | PRN

## 2022-09-24 MED ORDER — ALBUTEROL SULFATE (2.5 MG/3ML) 0.083% IN NEBU: 3.0000 mL | INHALATION_SOLUTION | Freq: Four times a day (QID) | RESPIRATORY_TRACT | 1 refills | Status: DC | PRN

## 2022-09-24 MED ORDER — FLUTICASONE-SALMETEROL 250-50 MCG/ACT IN AEPB: 1.0000 | INHALATION_SPRAY | Freq: Two times a day (BID) | RESPIRATORY_TRACT | 5 refills | Status: DC

## 2022-09-24 NOTE — Patient Instructions (Addendum)
Asthmatic bronchitis Mild restrictive defect --CONTINUE Advair 250 mcg ONE puff TWICE a day. 1st preference --CONTINUE Spiriva 1.25 mcg TWO puffs ONCE a day Take this if you can get it --CONTINUE Albuterol every 4 hours AS NEEDED --CONTINUE Albuterol nebulizer q6h AS NEEDED --Discussed vaccinations. UTD on influenza. Ok to get COVID booster.   Chronic cough  --CONTINUE omeprazole 20 mg twice a day --CONTINUE atrovent nasal spray twice a day  Follow-up with me in January

## 2022-09-24 NOTE — Progress Notes (Signed)
Subjective:   PATIENT ID: [name] GENDER: female DOB: May 25, 1971, MRN: [id number]   HPI  Chief Complaint  Patient presents with   Follow-up    Feels like something is stuck in her throat. Spiriva to expensive    Reason for Visit: Follow-up   [name] is a 51 year old female former smoker with hx TIA and chronic headaches who presents for follow-up  Initial consult She reports long standing of respiratory issues including frequent pneumonias as a child and once during her pregnancy at age 32. She has had worsening unproductive cough in the last two years with 3-5 urgent care/ED visits in that timeframe. She reports upper airway congestion and wheezing. Sometimes has post-tussive emesis. Cough occurs all day but seems to occur more at night.  ED note from 06/30/21 by NP Vickki Muff reviewed. She presented with productive cough, nasal congestion and wheezing x 1 week. Treated with Augmentin, prednisone and benzonatate. Advised to be seen by Pulmonary for COPD exacerbation follow-up. She has been on AirDuo Respiclick that she is intermittently is compliant. After she finished her steroids, she is having recurrent wheezing and cough. Reports reflux on omeprazole  11/25/21 Since our last visit she has an asthma/bronchitis flare up in December. She recovered since then but in the last week she has been having shortness of breath and productive cough but difficult to bring up sputum. Has been mucinex for the last two weeks. Intermittent wheezing. Denies fevers, chills. Neg home COVID-19 test five days ago.  01/27/2022 Since her last visit she had as asthma/bronchitis flare requiring prednisone and azithromycin in January and February 2023.  Currently she is on high-dose Advair HFA. She has a persistent cough. Mucinex has improved the congestion. Cough is dry and making her throat irritated. Some wheezing. She has cycles of illness every 3-4 weeks that are relieved with prednisone and  azithromycin. Only has shortness of breath coughing episodes.   04/09/2022 Since her last visit she was treated for asthmatic bronchitis flare in early May with resolution of symptoms.  Reports Spiriva has been helping so this was prescribed.  Referred to ENT last visit however she has not been contacted. She reports she recently developed head pressure x2 days associated with cough wheezing that is new. Compliant with inhalers.  09/24/22 Since our last visit she has had an exacerbation and treated with steroids. Improved however will intermittent coughing throughout the day. Associated with wheezing and shortness of breath.   2023 Jan Feb Mar April May June July Aug Sept Oct Nov Dec   X Evans Lance  XX Junius Argyle   3825 Jan Feb Mar April May June July Aug Sept Oct Nov Dec                2025 Jan Feb Mar April May June July Aug Sept Oct Nov Dec                 Social History: Quit smoking in 2015. Started 51 years old. 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  Past Medical History:  Diagnosis Date   Anemia    Anxiety    Arthritis    right knee, back   COPD (chronic obstructive pulmonary disease) (HCC)    GERD (gastroesophageal reflux disease)    Headache(784.0)    History of blood transfusion    Hx; of in  1991 after delivery   History of TIAs    Hypertension    Numbness    Right hand   Pneumonia    Stroke ([place]) 05/2013     Family History  Problem Relation Age of Onset   Renal Disease Father        dialysis   Hypertension Father    Heart disease Mother    Heart disease Maternal Grandfather    Asthma Sister    Asthma Maternal Aunt      Social History   Occupational History   Occupation: Forensic psychologist: [employer]  Tobacco Use   Smoking status: Former    Packs/day: 0.50    Years: 26.00    Total pack years: 13.00    Types: Cigarettes    Quit date: 10/09/2015    Years since quitting: 6.9     Passive exposure: Past   Smokeless tobacco: Former  Scientific laboratory technician Use: Never used  Substance and Sexual Activity   Alcohol use: Not Currently    Alcohol/week: 0.0 standard drinks of alcohol    Comment: occ   Drug use: No   Sexual activity: Not Currently    Birth control/protection: Surgical    Allergies  Allergen Reactions   Shellfish Allergy Itching and Swelling   Amoxicillin Other (See Comments)    Yeast infection   Latex Itching and Rash     Outpatient Medications Prior to Visit  Medication Sig Dispense Refill   Tiotropium Bromide Monohydrate (SPIRIVA RESPIMAT) 2.5 MCG/ACT AERS Inhale 2 puffs into the lungs daily. 4 g 5   albuterol (PROVENTIL) (2.5 MG/3ML) 0.083% nebulizer solution Take 3 mLs by nebulization every 6 (six) hours as needed for wheezing or shortness of breath. 180 mL 1   albuterol (VENTOLIN HFA) 108 (90 Base) MCG/ACT inhaler Inhale 2 puffs into the lungs every 6 (six) hours as needed for wheezing or shortness of breath. 8 g 5   amLODipine (NORVASC) 5 MG tablet Take 1 tablet (5 mg total) by mouth daily. 30 tablet 3   baclofen (LIORESAL) 10 MG tablet Take 1 tablet (10 mg total) by mouth at bedtime as needed for muscle spasms. (Patient not taking: Reported on 09/24/2022) 5 each 0   diazepam (VALIUM) 5 MG tablet Take one tablet by mouth with food one hour prior to procedure. May repeat 30 minutes prior if needed. 2 tablet 0   escitalopram (LEXAPRO) 10 MG tablet Take 1 tablet (10 mg total) by mouth at bedtime. (Patient not taking: Reported on 09/24/2022) 30 tablet 3   ferrous sulfate 325 (65 FE) MG tablet Take 1 tablet (325 mg total) by mouth every other day. 90 tablet 0   gabapentin (NEURONTIN) 100 MG capsule Take 1 capsule (100 mg total) by mouth 4 (four) times daily. 120 capsule 0   ipratropium (ATROVENT) 0.06 % nasal spray PLACE 2 SPRAYS INTO BOTH NOSTRILS 4 TIMES DAILY. (Patient not taking: Reported on 09/24/2022) 15 mL 5   omeprazole (PRILOSEC) 40 MG capsule  Take 1 capsule (40 mg total) by mouth daily. 90 capsule 1   predniSONE (DELTASONE) 20 MG tablet Take 1 tablet (20 mg total) by mouth daily with breakfast. 5 tablet 0   Facility-Administered Medications Prior to Visit  Medication Dose Route Frequency Provider Last Rate Last Admin   methylPREDNISolone acetate (DEPO-MEDROL) injection 40 mg  40 mg Other Once Magnus Sinning, MD        Review of Systems  Constitutional:  Negative for  chills, diaphoresis, fever, malaise/fatigue and weight loss.  HENT:  Negative for congestion.   Respiratory:  Positive for cough, shortness of breath and wheezing. Negative for hemoptysis and sputum production.   Cardiovascular:  Negative for chest pain, palpitations and leg swelling.     Objective:   Vitals:   09/24/22 1128  BP: (!) 140/86  Pulse: 89  SpO2: 99%  Weight: 201 lb 3.2 oz (91.3 kg)  Height: 5' 3.5" (1.613 m)   SpO2: 99 % O2 Device: None (Room air)  Physical Exam: General: Well-appearing, no acute distress HENT: [place], AT Eyes: EOMI, no scleral icterus Respiratory: Diminished but clear to auscultation bilaterally.  No crackles, wheezing or rales Cardiovascular: RRR, -M/R/G, no JVD Extremities:-Edema,-tenderness Neuro: AAO x4, CNII-XII grossly intact Psych: Normal mood, normal affect   Data Reviewed:  Imaging: CTA 11/01/15 - No pulmonary embolism. Bilateral patchy opacities. Right hilar node measuring 1.4 cm CXR 09/22/21 - Left infrahilar atelectasis. Cervical hardware CT Chest 11/11/21 - Resolution of right hilar adenopathy. Bibasilar scarring. Increased right thyroid lobe size.   PFT: 11/25/21 FVC 1.98 (69%) FEV1 1.7 (74%) Ratio 86  TLC 71% DLCO 73% Interpretation: Mild restrictive defect with mildly reduced gas exchange. No significant bronchodilator response. F-V loops with minimal airway disease.  Labs: CBC    Component Value Date/Time   WBC 7.9 01/02/2022 0435   RBC 5.34 (H) 01/02/2022 0435   HGB 12.6 01/02/2022 0435    HGB 12.2 02/09/2021 1515   HGB 12.4 12/15/2020 1137   HCT 40.5 01/02/2022 0435   HCT 40.5 12/15/2020 1137   PLT 310 01/02/2022 0435   PLT 248 02/09/2021 1515   PLT 308 12/15/2020 1137   MCV 75.8 (L) 01/02/2022 0435   MCV 75 (L) 12/15/2020 1137   MCH 23.6 (L) 01/02/2022 0435   MCHC 31.1 01/02/2022 0435   RDW 17.6 (H) 01/02/2022 0435   RDW 21.7 (H) 11/17/2020 1426   LYMPHSABS 2.4 01/02/2022 0435   LYMPHSABS 2.4 12/15/2020 1137   MONOABS 0.3 01/02/2022 0435   EOSABS 0.0 01/02/2022 0435   EOSABS 0.0 12/15/2020 1137   BASOSABS 0.0 01/02/2022 0435   BASOSABS 0.0 12/15/2020 1137   Absolute eos 02/09/21 - 0     Assessment & Plan:   Discussion: 51 year old female former smoker with restrictive lung defect history of TIA and chronic headaches who presents for follow-up.  Initially seen by me for right hilar mass which has self resolved, suspect inflammatory/reactive.  Has required multiple courses of prednisone and antibiotics for acute on chronic bronchitis/asthma-like flares.  Tessalon Perles are ineffective.  Prednisone effective however developed weight gain and swelling.  Currently in exacerbation. Initially seen by me for right hilar mass which has self resolved, suspect inflammatory/reactive. Has had multiple exacerbations in the last year. Not a candidate for biologics. Discussed clinical course and management of asthmatic bronchitis/chronic bronchitis including bronchodilator regimen and action plan for exacerbation.   51 year old female former smoker with restrictive lung defect, hx TIA and chronic headaches who presents for follow-up.   Asthmatic bronchitis Mild restrictive defect --CONTINUE Advair 250 mcg ONE puff TWICE a day. 1st preference --CONTINUE Spiriva 1.25 mcg TWO puffs ONCE a day Take this if you can get it --CONTINUE Albuterol every 4 hours AS NEEDED --CONTINUE Albuterol nebulizer q6h AS NEEDED --Discussed vaccinations. UTD on influenza. Ok to get COVID booster.    Chronic cough --CONTINUE omeprazole 20 mg twice a day --CONTINUE atrovent nasal spray twice a day  Right hilar enlargement - resolved  Health Maintenance Immunization History  Administered Date(s) Administered   Influenza,inj,Quad PF,6+ Mos 09/04/2015, 11/11/2016   Influenza-Unspecified 08/08/2014, 08/22/2021   Moderna Sars-Covid-2 Vaccination 11/10/2019, 12/08/2019, 09/16/2020   Pneumococcal Polysaccharide-23 05/21/2013   Tdap 12/15/2020   CT Lung Screen - not indicated  No orders of the defined types were placed in this encounter.  Meds ordered this encounter  Medications   fluticasone-salmeterol (ADVAIR DISKUS) 250-50 MCG/ACT AEPB    Sig: Inhale 1 puff into the lungs in the morning and at bedtime.    Dispense:  60 each    Refill:  5   albuterol (PROVENTIL) (2.5 MG/3ML) 0.083% nebulizer solution    Sig: Take 3 mLs by nebulization every 6 (six) hours as needed for wheezing or shortness of breath.    Dispense:  180 mL    Refill:  1   albuterol (VENTOLIN HFA) 108 (90 Base) MCG/ACT inhaler    Sig: Inhale 2 puffs into the lungs every 6 (six) hours as needed for wheezing or shortness of breath.    Dispense:  8 g    Refill:  5   Return in about 2 months (around 11/24/2022).  I have spent a total time of 32-minutes on the day of the appointment including chart review, data review, collecting history, coordinating care and discussing medical diagnosis and plan with the patient/family. Past medical history, allergies, medications were reviewed. Pertinent imaging, labs and tests included in this note have been reviewed and interpreted independently by me.  [place], MD [facility] 09/24/2022 11:36 AM  Office Number [phone]

## 2022-09-28 ENCOUNTER — Encounter: Payer: PRIVATE HEALTH INSURANCE | Admitting: Family

## 2022-09-28 DIAGNOSIS — Z1329 Encounter for screening for other suspected endocrine disorder: Secondary | ICD-10-CM

## 2022-09-28 DIAGNOSIS — Z13220 Encounter for screening for lipoid disorders: Secondary | ICD-10-CM

## 2022-09-28 DIAGNOSIS — I10 Essential (primary) hypertension: Secondary | ICD-10-CM

## 2022-09-28 DIAGNOSIS — K219 Gastro-esophageal reflux disease without esophagitis: Secondary | ICD-10-CM

## 2022-09-28 DIAGNOSIS — Z0000 Encounter for general adult medical examination without abnormal findings: Secondary | ICD-10-CM

## 2022-09-28 DIAGNOSIS — Z113 Encounter for screening for infections with a predominantly sexual mode of transmission: Secondary | ICD-10-CM

## 2022-09-28 DIAGNOSIS — Z1231 Encounter for screening mammogram for malignant neoplasm of breast: Secondary | ICD-10-CM

## 2022-09-28 DIAGNOSIS — F411 Generalized anxiety disorder: Secondary | ICD-10-CM

## 2022-09-28 DIAGNOSIS — Z8639 Personal history of other endocrine, nutritional and metabolic disease: Secondary | ICD-10-CM

## 2022-09-28 DIAGNOSIS — Z13228 Encounter for screening for other metabolic disorders: Secondary | ICD-10-CM

## 2022-09-28 DIAGNOSIS — Z131 Encounter for screening for diabetes mellitus: Secondary | ICD-10-CM

## 2022-09-28 DIAGNOSIS — Z124 Encounter for screening for malignant neoplasm of cervix: Secondary | ICD-10-CM

## 2022-09-28 DIAGNOSIS — D509 Iron deficiency anemia, unspecified: Secondary | ICD-10-CM

## 2022-09-28 DIAGNOSIS — Z87898 Personal history of other specified conditions: Secondary | ICD-10-CM

## 2022-09-28 DIAGNOSIS — Z1211 Encounter for screening for malignant neoplasm of colon: Secondary | ICD-10-CM

## 2022-09-29 NOTE — Progress Notes (Signed)
[name] - 51 y.o. female MRN [id number]  Date of birth: [birth year]  Office Visit Note: Visit Date: 09/20/2022 PCP: Camillia Herter, NP Referred by: Camillia Herter, NP  Subjective: Chief Complaint  Patient presents with   Neck - Pain   HPI:  [name] is a 51 y.o. female who comes in today for planned repeat Left C7-T1  Cervical Interlaminar epidural steroid injection with fluoroscopic guidance.  The patient has failed conservative care including home exercise, medications, time and activity modification.  This injection will be diagnostic and hopefully therapeutic.  Please see requesting physician notes for further details and justification. Patient received more than 50% pain relief from prior injection.   Referring: Dr. Anderson Malta and [facility], PA-C   ROS Otherwise per HPI.  Assessment & Plan: Visit Diagnoses:    ICD-10-CM   1. Cervical radiculopathy  M54.12 XR C-ARM NO REPORT    Epidural Steroid injection    methylPREDNISolone acetate (DEPO-MEDROL) injection 40 mg      Plan: No additional findings.   Meds & Orders:  Meds ordered this encounter  Medications   methylPREDNISolone acetate (DEPO-MEDROL) injection 40 mg    Orders Placed This Encounter  Procedures   XR C-ARM NO REPORT   Epidural Steroid injection    Follow-up: Return for visit to requesting provider as needed.   Procedures: No procedures performed  Cervical Epidural Steroid Injection - Interlaminar Approach with Fluoroscopic Guidance  Patient: [name]      Date of Birth: 05/11/1971 MRN: [id number] PCP: Camillia Herter, NP      Visit Date: 09/20/2022   Universal Protocol:    Date/Time: 11/22/231:43 PM  Consent Given By: the patient  Position: PRONE  Additional Comments: Vital signs were monitored before and after the procedure. Patient was prepped and draped in the usual sterile fashion. The correct patient, procedure, and site was verified.   Injection Procedure  Details:   Procedure diagnoses: Cervical radiculopathy [M54.12]    Meds Administered:  Meds ordered this encounter  Medications   methylPREDNISolone acetate (DEPO-MEDROL) injection 40 mg     Laterality: Left  Location/Site: C7-T1  Needle: 3.5 in., 20 ga. Tuohy  Needle Placement: Paramedian epidural space  Findings:  -Comments: Excellent flow of contrast into the epidural space.  Procedure Details: Using a paramedian approach from the side mentioned above, the region overlying the inferior lamina was localized under fluoroscopic visualization and the soft tissues overlying this structure were infiltrated with 4 ml. of 1% Lidocaine without Epinephrine. A # 20 gauge, Tuohy needle was inserted into the epidural space using a paramedian approach.  The epidural space was localized using loss of resistance along with contralateral oblique bi-planar fluoroscopic views.  After negative aspirate for air, blood, and CSF, a 2 ml. volume of Isovue-250 was injected into the epidural space and the flow of contrast was observed. Radiographs were obtained for documentation purposes.   The injectate was administered into the level noted above.  Additional Comments:  The patient tolerated the procedure well Dressing: 2 x 2 sterile gauze and Band-Aid    Post-procedure details: Patient was observed during the procedure. Post-procedure instructions were reviewed.  Patient left the clinic in stable condition.   Clinical History: MRI CERVICAL SPINE WITHOUT CONTRAST   TECHNIQUE: Multiplanar, multisequence MR imaging of the cervical spine was performed. No intravenous contrast was administered.   COMPARISON:  Cervical spine MRI 01/18/2014. Cervical spine radiographs 03/15/2022.   FINDINGS: Alignment: Chronic  straightening of cervical lordosis, less reversal of lordosis compared to the preoperative MRI in 2015.   Vertebrae: ACDF hardware artifact now C4-C5 and C5-C6. Mild degenerative  endplate marrow edema eccentric to the left at the adjacent C6-C7 segment (series 4, image 11). Background bone marrow signal within normal limits. No other No acute osseous abnormality identified.   Cord: No spinal cord signal abnormality despite degenerative cord mass effect detailed below. Negative visible upper thoracic canal and cord.   Posterior Fossa, vertebral arteries, paraspinal tissues: Cervicomedullary junction is within normal limits. Partially empty sella is chronic. Negative visible posterior fossa. Preserved major vascular flow voids in the neck. Right vertebral artery appears dominant as in 2015. Negative visible neck soft tissues and lung apices.   Disc levels:   C2-C3: Chronic disc bulging and endplate spurring eccentric to the right with moderate right side facet and ligament flavum hypertrophy. Borderline spinal stenosis now at this level. Moderate to severe right C3 foraminal stenosis has also progressed.   C3-C4: Progressed disc space loss with circumferential disc bulge, anterior endplate spurring, and lobulated posterior and slightly cephalad extrusion of disc (greater on the right series 7, image 7). Mild to moderate facet and ligament flavum hypertrophy has increased on the right. New mild spinal stenosis and mild ventral cord mass effect (same image). Mild to moderate bilateral C4 foraminal stenosis appears increased on the right.   C4-C5:  Interval ACDF. Resolved stenosis.   C5-C6: Interval ACDF. No stenosis. Residual endplate spurring on the left.   C6-C7: Interval disc space loss. Circumferential disc bulge and broad-based posterior component of disc (series 7, image 21). New mild spinal stenosis and ventral cord mass effect. Mild facet and ligament flavum hypertrophy. Mild to moderate bilateral C7 foraminal stenosis is new.   C7-T1:  Stable mild facet hypertrophy. No stenosis.   IMPRESSION: 1. ACDF C4-C5 and C5-C6 since a 2015 MRI with  resolved stenosis at those levels.   2. But progressed degeneration at both adjacent segments: C3-C4 and C6-C7. New mild spinal stenosis AND spinal cord mass effect at both levels. No cord signal abnormality. Up to moderate bilateral foraminal stenosis at both levels.   3. Progressed C2-C3 degeneration with new borderline spinal stenosis and increased moderate to severe right C3 foraminal stenosis.     Electronically Signed   By: Genevie Ann M.D.   On: 03/30/2022 15:42     Objective:  VS:  HT:    WT:   BMI:     BP:129/82  HR:89bpm  TEMP: ( )  RESP:  Physical Exam Vitals and nursing note reviewed.  Constitutional:      General: She is not in acute distress.    Appearance: Normal appearance. She is not ill-appearing.  HENT:     Head: Normocephalic and atraumatic.     Right Ear: External ear normal.     Left Ear: External ear normal.  Eyes:     Extraocular Movements: Extraocular movements intact.  Cardiovascular:     Rate and Rhythm: Normal rate.     Pulses: Normal pulses.  Musculoskeletal:     Cervical back: Tenderness present. No rigidity.     Right lower leg: No edema.     Left lower leg: No edema.     Comments: Patient has good strength in the upper extremities including 5 out of 5 strength in wrist extension long finger flexion and APB.  There is no atrophy of the hands intrinsically.  There is a negative Hoffmann's test.  Lymphadenopathy:     Cervical: No cervical adenopathy.  Skin:    Findings: No erythema, lesion or rash.  Neurological:     General: No focal deficit present.     Mental Status: She is alert and oriented to person, place, and time.     Sensory: No sensory deficit.     Motor: No weakness or abnormal muscle tone.     Coordination: Coordination normal.  Psychiatric:        Mood and Affect: Mood normal.        Behavior: Behavior normal.      Imaging: No results found.

## 2022-09-29 NOTE — Procedures (Signed)
Cervical Epidural Steroid Injection - Interlaminar Approach with Fluoroscopic Guidance  Patient: [name]      Date of Birth: 1971-07-14 MRN: [id number] PCP: Camillia Herter, NP      Visit Date: 09/20/2022   Universal Protocol:    Date/Time: 11/22/231:43 PM  Consent Given By: the patient  Position: PRONE  Additional Comments: Vital signs were monitored before and after the procedure. Patient was prepped and draped in the usual sterile fashion. The correct patient, procedure, and site was verified.   Injection Procedure Details:   Procedure diagnoses: Cervical radiculopathy [M54.12]    Meds Administered:  Meds ordered this encounter  Medications   methylPREDNISolone acetate (DEPO-MEDROL) injection 40 mg     Laterality: Left  Location/Site: C7-T1  Needle: 3.5 in., 20 ga. Tuohy  Needle Placement: Paramedian epidural space  Findings:  -Comments: Excellent flow of contrast into the epidural space.  Procedure Details: Using a paramedian approach from the side mentioned above, the region overlying the inferior lamina was localized under fluoroscopic visualization and the soft tissues overlying this structure were infiltrated with 4 ml. of 1% Lidocaine without Epinephrine. A # 20 gauge, Tuohy needle was inserted into the epidural space using a paramedian approach.  The epidural space was localized using loss of resistance along with contralateral oblique bi-planar fluoroscopic views.  After negative aspirate for air, blood, and CSF, a 2 ml. volume of Isovue-250 was injected into the epidural space and the flow of contrast was observed. Radiographs were obtained for documentation purposes.   The injectate was administered into the level noted above.  Additional Comments:  The patient tolerated the procedure well Dressing: 2 x 2 sterile gauze and Band-Aid    Post-procedure details: Patient was observed during the procedure. Post-procedure instructions were  reviewed.  Patient left the clinic in stable condition.

## 2022-10-04 ENCOUNTER — Telehealth: Payer: Self-pay | Admitting: Physical Medicine and Rehabilitation

## 2022-10-04 NOTE — Telephone Encounter (Signed)
Pt called requesting a call to set an appt for back injection. Please call pt at [phone].

## 2022-10-07 ENCOUNTER — Encounter (HOSPITAL_BASED_OUTPATIENT_CLINIC_OR_DEPARTMENT_OTHER): Payer: Self-pay | Admitting: Pulmonary Disease

## 2022-10-07 NOTE — Telephone Encounter (Signed)
Spoke with patient and she stated Dr. [name] informed her to wait a couple weeks before scheduling back injection. She had neck injection on 09/20/22. Please advise

## 2022-10-07 NOTE — Telephone Encounter (Signed)
Spoke with patient and scheduled ov for 10/11/22

## 2022-10-11 ENCOUNTER — Ambulatory Visit: Payer: PRIVATE HEALTH INSURANCE | Admitting: Physical Medicine and Rehabilitation

## 2022-11-02 ENCOUNTER — Telehealth: Payer: Self-pay | Admitting: Family

## 2022-11-02 ENCOUNTER — Telehealth: Payer: Self-pay | Admitting: Physician Assistant

## 2022-11-02 DIAGNOSIS — B9689 Other specified bacterial agents as the cause of diseases classified elsewhere: Secondary | ICD-10-CM

## 2022-11-02 DIAGNOSIS — J208 Acute bronchitis due to other specified organisms: Secondary | ICD-10-CM

## 2022-11-02 MED ORDER — DOXYCYCLINE HYCLATE 100 MG PO TABS: 100.0000 mg | ORAL_TABLET | Freq: Two times a day (BID) | ORAL | 0 refills | Status: DC

## 2022-11-02 MED ORDER — BENZONATATE 100 MG PO CAPS: 100.0000 mg | ORAL_CAPSULE | Freq: Three times a day (TID) | ORAL | 0 refills | Status: DC | PRN

## 2022-11-02 NOTE — Progress Notes (Signed)
Virtual Visit Consent   [name], you are scheduled for a virtual visit with a [facility] provider today. Just as with appointments in the office, your consent must be obtained to participate. Your consent will be active for this visit and any virtual visit you may have with one of our providers in the next 365 days. If you have a MyChart account, a copy of this consent can be sent to you electronically.  As this is a virtual visit, video technology does not allow for your provider to perform a traditional examination. This may limit your provider's ability to fully assess your condition. If your provider identifies any concerns that need to be evaluated in person or the need to arrange testing (such as labs, EKG, etc.), we will make arrangements to do so. Although advances in technology are sophisticated, we cannot ensure that it will always work on either your end or our end. If the connection with a video visit is poor, the visit may have to be switched to a telephone visit. With either a video or telephone visit, we are not always able to ensure that we have a secure connection.  By engaging in this virtual visit, you consent to the provision of healthcare and authorize for your insurance to be billed (if applicable) for the services provided during this visit. Depending on your insurance coverage, you may receive a charge related to this service.  I need to obtain your verbal consent now. Are you willing to proceed with your visit today? [name] has provided verbal consent on 11/02/2022 for a virtual visit (video or telephone). Leeanne Rio, [place]  Date: 11/02/2022 7:58 AM  Virtual Visit via Video Note   I, Leeanne Rio, connected with  [name]  ([id number], 05/31/1971) on 11/02/22 at  7:45 AM EST by a video-enabled telemedicine application and verified that I am speaking with the correct person using two identifiers.  Location: Patient: Virtual Visit Location  Patient: Home Provider: Virtual Visit Location Provider: Home Office   I discussed the limitations of evaluation and management by telemedicine and the availability of in person appointments. The patient expressed understanding and agreed to proceed.    History of Present Illness: [name] is a 51 y.o. who identifies as a female who was assigned female at birth, and is being seen today for week or so of URI symptoms. Started with scratchy throat and nasal congestion, thinking allergies. Has continued to progress since that time, with increased chest congestion and cough that was dry but now productive. Denies sinus pain. Denies fever but notes some chills.  HPI: HPI  Problems:  Patient Active Problem List   Diagnosis Date Noted   Trochanteric bursitis, left hip 09/03/2022   Asthmatic bronchitis with acute exacerbation 04/09/2022   Chronic cough 01/27/2022   Right thyroid nodule 01/07/2022   Essential hypertension 01/05/2022   Migraine 01/05/2022   Thyroid nodule greater than or equal to 1.5 cm in diameter incidentally noted on imaging study 12/01/2021   Grief 02/06/2021   Non-intractable vomiting 02/06/2021   Arthritis of right knee    S/P knee replacement 01/06/2021   Morning headache 06/02/2017   Leiomyoma of body of uterus 12/24/2016   Prediabetes 11/11/2016   Pap smear abnormality of cervix with ASCUS favoring benign 01/01/2016   COPD (chronic obstructive pulmonary disease) ([place]) 11/18/2015   GERD (gastroesophageal reflux disease) 11/18/2015   Abnormal imaging of thyroid 11/04/2015   COPD exacerbation ([place]) 11/01/2015  Elevated d-dimer 11/01/2015   IDA (iron deficiency anemia) 09/04/2015   Patellofemoral disorder 04/04/2015   Insomnia 04/09/2014   S/P cervical spinal fusion 03/06/2014   Class 2 severe obesity due to excess calories with serious comorbidity and body mass index (BMI) of 37.0 to 37.9 in adult [facility]) 02/22/2014   Radicular pain in right arm 01/11/2014    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   Other and unspecified hyperlipidemia 08/24/2013   History of CVA (cerebrovascular accident) 05/18/2013   Tobacco abuse, 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    Allergies:  Allergies  Allergen Reactions   Shellfish Allergy Itching and Swelling   Amoxicillin Other (See Comments)    Yeast infection   Latex Itching and Rash   Medications:  Current Outpatient Medications:    benzonatate (TESSALON) 100 MG capsule, Take 1 capsule (100 mg total) by mouth 3 (three) times daily as needed for cough., Disp: 30 capsule, Rfl: 0   doxycycline (VIBRA-TABS) 100 MG tablet, Take 1 tablet (100 mg total) by mouth 2 (two) times daily., Disp: 14 tablet, Rfl: 0   albuterol (PROVENTIL) (2.5 MG/3ML) 0.083% nebulizer solution, Take 3 mLs by nebulization every 6 (six) hours as needed for wheezing or shortness of breath., Disp: 180 mL, Rfl: 1   albuterol (VENTOLIN HFA) 108 (90 Base) MCG/ACT inhaler, Inhale 2 puffs into the lungs every 6 (six) hours as needed for wheezing or shortness of breath., Disp: 8 g, Rfl: 5   amLODipine (NORVASC) 5 MG tablet, Take 1 tablet (5 mg total) by mouth daily., Disp: 30 tablet, Rfl: 3   baclofen (LIORESAL) 10 MG tablet, Take 1 tablet (10 mg total) by mouth at bedtime as needed for muscle spasms. (Patient not taking: Reported on 09/24/2022), Disp: 5 each, Rfl: 0   COVID-19 mRNA vaccine 2023-2024 (COMIRNATY) syringe, Inject into the muscle., Disp: 0.3 mL, Rfl: 0   diazepam (VALIUM) 5 MG tablet, Take one tablet by mouth with food one hour prior to procedure. May repeat 30 minutes prior if needed., Disp: 2 tablet, Rfl: 0   ferrous sulfate 325 (65 FE) MG tablet, Take 1 tablet (325 mg total) by mouth every other day., Disp: 90 tablet, Rfl: 0   fluticasone-salmeterol (ADVAIR DISKUS) 250-50 MCG/ACT AEPB, Inhale 1 puff into the lungs in the morning and at bedtime., Disp: 60 each, Rfl: 5   gabapentin (NEURONTIN) 100 MG capsule, Take 1 capsule (100 mg total) by mouth  4 (four) times daily., Disp: 120 capsule, Rfl: 0   omeprazole (PRILOSEC) 40 MG capsule, Take 1 capsule (40 mg total) by mouth daily., Disp: 90 capsule, Rfl: 1   Tiotropium Bromide Monohydrate (SPIRIVA RESPIMAT) 2.5 MCG/ACT AERS, Inhale 2 puffs into the lungs daily., Disp: 4 g, Rfl: 5  Observations/Objective: Patient is well-developed, well-nourished in no acute distress.  Resting comfortably at home.  Head is normocephalic, atraumatic.  No labored breathing. Speech is clear and coherent with logical content.  Patient is alert and oriented at baseline.   Assessment and Plan: 1. Acute bacterial bronchitis - benzonatate (TESSALON) 100 MG capsule; Take 1 capsule (100 mg total) by mouth 3 (three) times daily as needed for cough.  Dispense: 30 capsule; Refill: 0 - doxycycline (VIBRA-TABS) 100 MG tablet; Take 1 tablet (100 mg total) by mouth 2 (two) times daily.  Dispense: 14 tablet; Refill: 0  Rx Doxycycline.  Increase fluids.  Rest.  Saline nasal spray.  Probiotic.  Mucinex as directed.  Humidifier in bedroom. Tessalon per orders.  Call or return to  clinic if symptoms are not improving.   Follow Up Instructions: I discussed the assessment and treatment plan with the patient. The patient was provided an opportunity to ask questions and all were answered. The patient agreed with the plan and demonstrate understanding. Instructions were sent to the patient via MyChart unless otherwise noted below.   The patient was advised to call back or seek an in-person evaluation if the symptoms worsen or if the condition fails to improve as anticipated.  Time:  I spent 10 minutes with the patient via telehealth technology discussing the above problems/concerns.    Leeanne Rio, PA-C

## 2022-11-02 NOTE — Patient Instructions (Signed)
[name], thank you for joining Leeanne Rio, PA-C for today's virtual visit.  While this provider is not your primary care provider (PCP), if your PCP is located in our provider database this encounter information will be shared with them immediately following your visit.   [facility] account gives you access to today's visit and all your visits, tests, and labs performed at [facility] " click here if you don't have a [facility] account or go to [email or web address]  Consent: (Patient) [name] provided verbal consent for this virtual visit at the beginning of the encounter.  Current Medications:  Current Outpatient Medications:    benzonatate (TESSALON) 100 MG capsule, Take 1 capsule (100 mg total) by mouth 3 (three) times daily as needed for cough., Disp: 30 capsule, Rfl: 0   doxycycline (VIBRA-TABS) 100 MG tablet, Take 1 tablet (100 mg total) by mouth 2 (two) times daily., Disp: 14 tablet, Rfl: 0   albuterol (PROVENTIL) (2.5 MG/3ML) 0.083% nebulizer solution, Take 3 mLs by nebulization every 6 (six) hours as needed for wheezing or shortness of breath., Disp: 180 mL, Rfl: 1   albuterol (VENTOLIN HFA) 108 (90 Base) MCG/ACT inhaler, Inhale 2 puffs into the lungs every 6 (six) hours as needed for wheezing or shortness of breath., Disp: 8 g, Rfl: 5   amLODipine (NORVASC) 5 MG tablet, Take 1 tablet (5 mg total) by mouth daily., Disp: 30 tablet, Rfl: 3   baclofen (LIORESAL) 10 MG tablet, Take 1 tablet (10 mg total) by mouth at bedtime as needed for muscle spasms. (Patient not taking: Reported on 09/24/2022), Disp: 5 each, Rfl: 0   COVID-19 mRNA vaccine 2023-2024 (COMIRNATY) syringe, Inject into the muscle., Disp: 0.3 mL, Rfl: 0   diazepam (VALIUM) 5 MG tablet, Take one tablet by mouth with food one hour prior to procedure. May repeat 30 minutes prior if needed., Disp: 2 tablet, Rfl: 0   ferrous sulfate 325 (65 FE) MG tablet, Take 1 tablet (325  mg total) by mouth every other day., Disp: 90 tablet, Rfl: 0   fluticasone-salmeterol (ADVAIR DISKUS) 250-50 MCG/ACT AEPB, Inhale 1 puff into the lungs in the morning and at bedtime., Disp: 60 each, Rfl: 5   gabapentin (NEURONTIN) 100 MG capsule, Take 1 capsule (100 mg total) by mouth 4 (four) times daily., Disp: 120 capsule, Rfl: 0   omeprazole (PRILOSEC) 40 MG capsule, Take 1 capsule (40 mg total) by mouth daily., Disp: 90 capsule, Rfl: 1   Tiotropium Bromide Monohydrate (SPIRIVA RESPIMAT) 2.5 MCG/ACT AERS, Inhale 2 puffs into the lungs daily., Disp: 4 g, Rfl: 5   Medications ordered in this encounter:  Meds ordered this encounter  Medications   benzonatate (TESSALON) 100 MG capsule    Sig: Take 1 capsule (100 mg total) by mouth 3 (three) times daily as needed for cough.    Dispense:  30 capsule    Refill:  0    Order Specific Question:   Supervising Provider    Answer:   Chase Picket [id number]   doxycycline (VIBRA-TABS) 100 MG tablet    Sig: Take 1 tablet (100 mg total) by mouth 2 (two) times daily.    Dispense:  14 tablet    Refill:  0    Order Specific Question:   Supervising Provider    Answer:   Chase Picket [id number]     *If you need refills on other medications prior to your next appointment, please contact your pharmacy*  Follow-Up: Call back or seek an in-person evaluation if the symptoms worsen or if the condition fails to improve as anticipated.  [place] [phone]  Other Instructions Take antibiotic (Doxycycline) as directed.  Increase fluids.  Get plenty of rest. Use Mucinex for congestion. Tessalon per orders. Take a daily probiotic (I recommend Align or Culturelle, but even Activia Yogurt may be beneficial).  A humidifier placed in the bedroom may offer some relief for a dry, scratchy throat of nasal irritation.  Read information below on acute bronchitis. Please call or return to clinic if symptoms are not improving.  Acute  Bronchitis Bronchitis is when the airways that extend from the windpipe into the lungs get red, puffy, and painful (inflamed). Bronchitis often causes thick spit (mucus) to develop. This leads to a cough. A cough is the most common symptom of bronchitis. In acute bronchitis, the condition usually begins suddenly and goes away over time (usually in 2 weeks). Smoking, allergies, and asthma can make bronchitis worse. Repeated episodes of bronchitis may cause more lung problems.  HOME CARE Rest. Drink enough fluids to keep your pee (urine) clear or pale yellow (unless you need to limit fluids as told by your doctor). Only take over-the-counter or prescription medicines as told by your doctor. Avoid smoking and secondhand smoke. These can make bronchitis worse. If you are a smoker, think about using nicotine gum or skin patches. Quitting smoking will help your lungs heal faster. Reduce the chance of getting bronchitis again by: Washing your hands often. Avoiding people with cold symptoms. Trying not to touch your hands to your mouth, nose, or eyes. Follow up with your doctor as told.  GET HELP IF: Your symptoms do not improve after 1 week of treatment. Symptoms include: Cough. Fever. Coughing up thick spit. Body aches. Chest congestion. Chills. Shortness of breath. Sore throat.  GET HELP RIGHT AWAY IF:  You have an increased fever. You have chills. You have severe shortness of breath. You have bloody thick spit (sputum). You throw up (vomit) often. You lose too much body fluid (dehydration). You have a severe headache. You faint.  MAKE SURE YOU:  Understand these instructions. Will watch your condition. Will get help right away if you are not doing well or get worse. Document Released: 04/12/2008 Document Revised: 06/27/2013 Document Reviewed: 04/17/2013 [facility] Patient Information 2015 [place], [place]. This information is not intended to replace advice given to you by your  health care provider. Make sure you discuss any questions you have with your health care provider.    If you have been instructed to have an in-person evaluation today at a local Urgent Care facility, please use the link below. It will take you to a list of all of our available [facility], including address, phone number and hours of operation. Please do not delay care.  [facility]  If you or a family member do not have a primary care provider, use the link below to schedule a visit and establish care. When you choose a [facility] primary care physician or advanced practice provider, you gain a long-term partner in health. Find a Primary Care Provider  Learn more about [facility]'s in-office and virtual care options: [facility]

## 2022-11-02 NOTE — Telephone Encounter (Signed)
Medication Refill - Medication: Expired - amLODipine (NORVASC) 5 MG tablet Offer pt several sooner appts but stated she was working on all dates available. She did decide to take an appt on 1/8 but wanted to know how soon Amy would call in med.  As needed yesterday I told pt it  possiblely may not be called in until she has been seen, since there are several open appts.  Pt was very rude and said that that was all she should need to do was make the appt, stating that she had done her part and she was not going to get off work just to come to the dr. She states she is excepting a refill at least to get her to the appt. She wanted a confirmation from me her med would be called in today and not happy with me as I told her I would have to ask the provider.   Has the patient contacted their pharmacy? Yes.   (Agent: If no, request that the patient contact the pharmacy for the refill. If patient does not wish to contact the pharmacy document the reason why and proceed with request.) (Agent: If yes, when and what did the pharmacy advise?) call dr  Preferred Pharmacy (with phone number or street name):  [facility] [address] Phone: [phone]  Fax: [phone]     Has the patient been seen for an appointment in the last year OR does the patient have an upcoming appointment? Yes.   11/15/22 last 7/23  Agent: Please be advised that RX refills may take up to 3 business days. We ask that you follow-up with your pharmacy.

## 2022-11-03 ENCOUNTER — Other Ambulatory Visit: Payer: Self-pay

## 2022-11-03 DIAGNOSIS — I10 Essential (primary) hypertension: Secondary | ICD-10-CM

## 2022-11-03 MED ORDER — AMLODIPINE BESYLATE 5 MG PO TABS: 5.0000 mg | ORAL_TABLET | Freq: Every day | ORAL | 2 refills | Status: DC

## 2022-11-03 NOTE — Telephone Encounter (Signed)
Order complete. 

## 2022-11-12 NOTE — Progress Notes (Unsigned)
Patient ID: [name], female    DOB: 08/20/1971  MRN: [id number]  CC: Chronic Care Management   Subjective: [name] is a 52 y.o. female who presents for chronic care management.   Her concerns today include:  HTN - Amlodipine  GERD - Omeprazole  GAD - Lexapro   Ortho - left arm pain, lumbar radiculopathy Neuro - nerve pain  Patient Active Problem List   Diagnosis Date Noted   Trochanteric bursitis, left hip 09/03/2022   Asthmatic bronchitis with acute exacerbation 04/09/2022   Chronic cough 01/27/2022   Right thyroid nodule 01/07/2022   Essential hypertension 01/05/2022   Migraine 01/05/2022   Thyroid nodule greater than or equal to 1.5 cm in diameter incidentally noted on imaging study 12/01/2021   Grief 02/06/2021   Non-intractable vomiting 02/06/2021   Arthritis of right knee    S/P knee replacement 01/06/2021   Morning headache 06/02/2017   Leiomyoma of body of uterus 12/24/2016   Prediabetes 11/11/2016   Pap smear abnormality of cervix with ASCUS favoring benign 01/01/2016   COPD (chronic obstructive pulmonary disease) ([place]) 11/18/2015   GERD (gastroesophageal reflux disease) 11/18/2015   Abnormal imaging of thyroid 11/04/2015   COPD exacerbation ([place]) 11/01/2015   Elevated d-dimer 11/01/2015   IDA (iron deficiency anemia) 09/04/2015   Patellofemoral disorder 04/04/2015   Insomnia 04/09/2014   S/P cervical spinal fusion 03/06/2014   Class 2 severe obesity due to excess calories with serious comorbidity and body mass index (BMI) of 37.0 to 37.9 in adult ([place]) 02/22/2014   Radicular pain in right arm 01/11/2014   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   Other and unspecified hyperlipidemia 08/24/2013   History of CVA (cerebrovascular accident) 05/18/2013   Tobacco abuse, 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     Current Outpatient Medications on File Prior to Visit  Medication Sig Dispense Refill   albuterol (PROVENTIL) (2.5 MG/3ML) 0.083% nebulizer  solution Take 3 mLs by nebulization every 6 (six) hours as needed for wheezing or shortness of breath. 180 mL 1   albuterol (VENTOLIN HFA) 108 (90 Base) MCG/ACT inhaler Inhale 2 puffs into the lungs every 6 (six) hours as needed for wheezing or shortness of breath. 8 g 5   amLODipine (NORVASC) 5 MG tablet Take 1 tablet (5 mg total) by mouth daily. 30 tablet 2   baclofen (LIORESAL) 10 MG tablet Take 1 tablet (10 mg total) by mouth at bedtime as needed for muscle spasms. (Patient not taking: Reported on 09/24/2022) 5 each 0   benzonatate (TESSALON) 100 MG capsule Take 1 capsule (100 mg total) by mouth 3 (three) times daily as needed for cough. 30 capsule 0   COVID-19 mRNA vaccine 2023-2024 (COMIRNATY) syringe Inject into the muscle. 0.3 mL 0   diazepam (VALIUM) 5 MG tablet Take one tablet by mouth with food one hour prior to procedure. May repeat 30 minutes prior if needed. 2 tablet 0   doxycycline (VIBRA-TABS) 100 MG tablet Take 1 tablet (100 mg total) by mouth 2 (two) times daily. 14 tablet 0   ferrous sulfate 325 (65 FE) MG tablet Take 1 tablet (325 mg total) by mouth every other day. 90 tablet 0   fluticasone-salmeterol (ADVAIR DISKUS) 250-50 MCG/ACT AEPB Inhale 1 puff into the lungs in the morning and at bedtime. 60 each 5   gabapentin (NEURONTIN) 100 MG capsule Take 1 capsule (100 mg total) by mouth 4 (four) times daily. 120 capsule 0   omeprazole (PRILOSEC) 40 MG capsule  Take 1 capsule (40 mg total) by mouth daily. 90 capsule 1   Tiotropium Bromide Monohydrate (SPIRIVA RESPIMAT) 2.5 MCG/ACT AERS Inhale 2 puffs into the lungs daily. 4 g 5   No current facility-administered medications on file prior to visit.    Allergies  Allergen Reactions   Shellfish Allergy Itching and Swelling   Amoxicillin Other (See Comments)    Yeast infection   Latex Itching and Rash    Social History   Socioeconomic History   Marital status: Divorced    Spouse name: Not on file   Number of children: 7    Years of education: GEd   Highest education level: Not on file  Occupational History   Occupation: CNA    Employer: [employer]  Tobacco Use   Smoking status: Former    Packs/day: 0.50    Years: 26.00    Total pack years: 13.00    Types: Cigarettes    Quit date: 10/09/2015    Years since quitting: 7.0    Passive exposure: Past   Smokeless tobacco: Former  Scientific laboratory technician Use: Never used  Substance and Sexual Activity   Alcohol use: Not Currently    Alcohol/week: 0.0 standard drinks of alcohol    Comment: occ   Drug use: No   Sexual activity: Not Currently    Birth control/protection: Surgical  Other Topics Concern   Not on file  Social History Narrative   Patient lives at home with her family    She has 7 children   5 grown and out of the house   2 at home 8 and 48   She works as a Quarry manager at [employer] Strain: Not on [employer] Insecurity: Not on file  Transportation Needs: Not on file  Physical Activity: Not on file  Stress: Not on file  Social Connections: Not on file  Intimate Partner Violence: Not on file    Family History  Problem Relation Age of Onset   Renal Disease Father        dialysis   Hypertension Father    Heart disease Mother    Heart disease Maternal Grandfather    Asthma Sister    Asthma Maternal Aunt     Past Surgical History:  Procedure Laterality Date   ANTERIOR CERVICAL DECOMP/DISCECTOMY FUSION N/A 03/06/2014   Procedure: ANTERIOR CERVICAL DECOMPRESSION/DISCECTOMY FUSION 2 LEVELS four/five, five/six;  Surgeon: Eustace Moore, MD;  Location: [facility];  Service: Neurosurgery;  Laterality: N/A;   CESAREAN SECTION     x 1 with 5th pregnancy   CHOLECYSTECTOMY     KNEE ARTHROSCOPY Right 2015   TEE WITHOUT CARDIOVERSION N/A 05/21/2013   Procedure: TRANSESOPHAGEAL ECHOCARDIOGRAM (TEE);  Surgeon: Sueanne Margarita, MD;  Location: [facility];  Service: Cardiovascular;  Laterality:  N/A;   TOTAL KNEE ARTHROPLASTY Right 01/06/2021   TOTAL KNEE ARTHROPLASTY Right 01/06/2021   Procedure: RIGHT TOTAL KNEE ARTHROPLASTY;  Surgeon: Meredith Pel, MD;  Location: [facility];  Service: Orthopedics;  Laterality: Right;   TUBAL LIGATION      ROS: Review of Systems Negative except as stated above  PHYSICAL EXAM: There were no vitals taken for this visit.  Physical Exam  {female adult master:310786} {female adult master:310785}     Latest Ref Rng & Units 06/03/2022   10:21 AM 01/02/2022    4:35 AM 02/09/2021    3:15 PM  CMP  Glucose 70 - 99 mg/dL 98  117  106   BUN 6 - 24 mg/dL '14  19  13   '$ Creatinine 0.57 - 1.00 mg/dL 0.77  1.00  0.91   Sodium 134 - 144 mmol/L 140  136  142   Potassium 3.5 - 5.2 mmol/L 3.7  3.8  3.5   Chloride 96 - 106 mmol/L 102  101  101   CO2 20 - 29 mmol/L '24  27  28   '$ Calcium 8.7 - 10.2 mg/dL 9.2  8.9  8.4   Total Protein 6.5 - 8.1 g/dL  7.7  7.5   Total Bilirubin 0.3 - 1.2 mg/dL  0.4  0.5   Alkaline Phos 38 - 126 U/L  72  104   AST 15 - 41 U/L  22  14   ALT 0 - 44 U/L  20  14    Lipid Panel     Component Value Date/Time   CHOL 212 (H) 02/12/2020 0800   TRIG 111 02/12/2020 0800   HDL 69 02/12/2020 0800   CHOLHDL 3.1 02/12/2020 0800   VLDL 18 09/04/2015 1242   LDLCALC 121 (H) 02/12/2020 0800    CBC    Component Value Date/Time   WBC 7.9 01/02/2022 0435   RBC 5.34 (H) 01/02/2022 0435   HGB 12.6 01/02/2022 0435   HGB 12.2 02/09/2021 1515   HGB 12.4 12/15/2020 1137   HCT 40.5 01/02/2022 0435   HCT 40.5 12/15/2020 1137   PLT 310 01/02/2022 0435   PLT 248 02/09/2021 1515   PLT 308 12/15/2020 1137   MCV 75.8 (L) 01/02/2022 0435   MCV 75 (L) 12/15/2020 1137   MCH 23.6 (L) 01/02/2022 0435   MCHC 31.1 01/02/2022 0435   RDW 17.6 (H) 01/02/2022 0435   RDW 21.7 (H) 11/17/2020 1426   LYMPHSABS 2.4 01/02/2022 0435   LYMPHSABS 2.4 12/15/2020 1137   MONOABS 0.3 01/02/2022 0435   EOSABS 0.0 01/02/2022 0435   EOSABS 0.0 12/15/2020 1137    BASOSABS 0.0 01/02/2022 0435   BASOSABS 0.0 12/15/2020 1137    ASSESSMENT AND PLAN:  There are no diagnoses linked to this encounter.   Patient was given the opportunity to ask questions.  Patient verbalized understanding of the plan and was able to repeat key elements of the plan. Patient was given clear instructions to go to Emergency Department or return to medical center if symptoms don't improve, worsen, or new problems develop.The patient verbalized understanding.   No orders of the defined types were placed in this encounter.    Requested Prescriptions    No prescriptions requested or ordered in this encounter    No follow-ups on file.  Camillia Herter, NP

## 2022-11-15 ENCOUNTER — Encounter: Payer: Self-pay | Admitting: Family

## 2022-11-15 ENCOUNTER — Telehealth: Payer: Self-pay | Admitting: Family

## 2022-11-15 ENCOUNTER — Ambulatory Visit (INDEPENDENT_AMBULATORY_CARE_PROVIDER_SITE_OTHER): Payer: PRIVATE HEALTH INSURANCE | Admitting: Family

## 2022-11-15 VITALS — BP 126/83 | HR 76 | Temp 98.3°F | Resp 16 | Ht 63.5 in | Wt 190.0 lb

## 2022-11-15 DIAGNOSIS — R059 Cough, unspecified: Secondary | ICD-10-CM | POA: Diagnosis not present

## 2022-11-15 DIAGNOSIS — R0989 Other specified symptoms and signs involving the circulatory and respiratory systems: Secondary | ICD-10-CM

## 2022-11-15 DIAGNOSIS — F32A Depression, unspecified: Secondary | ICD-10-CM

## 2022-11-15 DIAGNOSIS — Z131 Encounter for screening for diabetes mellitus: Secondary | ICD-10-CM | POA: Diagnosis not present

## 2022-11-15 DIAGNOSIS — I10 Essential (primary) hypertension: Secondary | ICD-10-CM

## 2022-11-15 DIAGNOSIS — F419 Anxiety disorder, unspecified: Secondary | ICD-10-CM

## 2022-11-15 DIAGNOSIS — F411 Generalized anxiety disorder: Secondary | ICD-10-CM

## 2022-11-15 DIAGNOSIS — Z13220 Encounter for screening for lipoid disorders: Secondary | ICD-10-CM | POA: Diagnosis not present

## 2022-11-15 DIAGNOSIS — K219 Gastro-esophageal reflux disease without esophagitis: Secondary | ICD-10-CM

## 2022-11-15 DIAGNOSIS — J029 Acute pharyngitis, unspecified: Secondary | ICD-10-CM | POA: Diagnosis not present

## 2022-11-15 LAB — POCT RAPID STREP A (OFFICE): Rapid Strep A Screen: NEGATIVE

## 2022-11-15 MED ORDER — HYDROXYZINE PAMOATE 25 MG PO CAPS: 25.0000 mg | ORAL_CAPSULE | Freq: Three times a day (TID) | ORAL | 1 refills | Status: DC | PRN

## 2022-11-15 MED ORDER — OMEPRAZOLE 40 MG PO CPDR: 40.0000 mg | DELAYED_RELEASE_CAPSULE | Freq: Every day | ORAL | 2 refills | Status: DC

## 2022-11-15 MED ORDER — FLUOXETINE HCL 10 MG PO CAPS: 10.0000 mg | ORAL_CAPSULE | Freq: Every day | ORAL | 0 refills | Status: DC

## 2022-11-15 MED ORDER — PREDNISONE 10 MG PO TABS: ORAL_TABLET | ORAL | 0 refills | Status: AC

## 2022-11-15 NOTE — Progress Notes (Signed)
.  Pt presents for chronic care management   -pt had video visit on 12/26 for bronchitis flare  - states she is still have lingering symptoms of cough, headache

## 2022-11-16 ENCOUNTER — Other Ambulatory Visit: Payer: Self-pay | Admitting: Family

## 2022-11-16 DIAGNOSIS — E785 Hyperlipidemia, unspecified: Secondary | ICD-10-CM | POA: Insufficient documentation

## 2022-11-16 DIAGNOSIS — E1165 Type 2 diabetes mellitus with hyperglycemia: Secondary | ICD-10-CM

## 2022-11-16 DIAGNOSIS — E119 Type 2 diabetes mellitus without complications: Secondary | ICD-10-CM | POA: Insufficient documentation

## 2022-11-16 LAB — LIPID PANEL
Chol/HDL Ratio: 4 ratio (ref 0.0–4.4)
Cholesterol, Total: 278 mg/dL — ABNORMAL HIGH (ref 100–199)
HDL: 69 mg/dL (ref >39)
LDL Chol Calc (NIH): 194 mg/dL — ABNORMAL HIGH (ref 0–99)
Triglycerides: 90 mg/dL (ref 0–149)
VLDL Cholesterol Cal: 15 mg/dL (ref 5–40)

## 2022-11-16 LAB — BASIC METABOLIC PANEL
BUN/Creatinine Ratio: 26 — ABNORMAL HIGH (ref 9–23)
BUN: 18 mg/dL (ref 6–24)
CO2: 19 mmol/L — ABNORMAL LOW (ref 20–29)
Calcium: 9.4 mg/dL (ref 8.7–10.2)
Chloride: 105 mmol/L (ref 96–106)
Creatinine, Ser: 0.7 mg/dL (ref 0.57–1.00)
Glucose: 100 mg/dL — ABNORMAL HIGH (ref 70–99)
Potassium: 4 mmol/L (ref 3.5–5.2)
Sodium: 142 mmol/L (ref 134–144)
eGFR: 103 mL/min/{1.73_m2} (ref >59)

## 2022-11-16 LAB — COVID-19, FLU A+B AND RSV
Influenza A, NAA: NOT DETECTED
Influenza B, NAA: NOT DETECTED
RSV, NAA: NOT DETECTED
SARS-CoV-2, NAA: NOT DETECTED

## 2022-11-16 LAB — HEMOGLOBIN A1C
Est. average glucose Bld gHb Est-mCnc: 148 mg/dL
Hgb A1c MFr Bld: 6.8 % — ABNORMAL HIGH (ref 4.8–5.6)

## 2022-11-16 MED ORDER — METFORMIN HCL ER 500 MG PO TB24: 500.0000 mg | ORAL_TABLET | Freq: Two times a day (BID) | ORAL | 0 refills | Status: DC

## 2022-11-16 MED ORDER — ATORVASTATIN CALCIUM 20 MG PO TABS: 20.0000 mg | ORAL_TABLET | Freq: Every day | ORAL | 2 refills | Status: DC

## 2022-11-17 ENCOUNTER — Encounter: Payer: Self-pay | Admitting: Family

## 2022-11-17 LAB — CULTURE, GROUP A STREP: Strep A Culture: NEGATIVE

## 2022-11-19 ENCOUNTER — Ambulatory Visit (INDEPENDENT_AMBULATORY_CARE_PROVIDER_SITE_OTHER): Payer: PRIVATE HEALTH INSURANCE | Admitting: Family

## 2022-11-19 DIAGNOSIS — F32A Depression, unspecified: Secondary | ICD-10-CM

## 2022-11-19 DIAGNOSIS — F419 Anxiety disorder, unspecified: Secondary | ICD-10-CM | POA: Diagnosis not present

## 2022-11-19 DIAGNOSIS — Z712 Person consulting for explanation of examination or test findings: Secondary | ICD-10-CM

## 2022-11-19 MED ORDER — CITALOPRAM HYDROBROMIDE 10 MG PO TABS: 10.0000 mg | ORAL_TABLET | Freq: Every day | ORAL | 0 refills | Status: DC

## 2022-11-19 NOTE — Progress Notes (Signed)
Virtual Visit via Telephone Note  I connected with [name], on 11/19/2022 at 10:01 AM by telephone and verified that I am speaking with the correct person using two identifiers.  Consent: I discussed the limitations, risks, security and privacy concerns of performing an evaluation and management service by telephone and the availability of in person appointments. I also discussed with the patient that there may be a patient responsible charge related to this service. The patient expressed understanding and agreed to proceed.   Location of Patient: Home  Location of Provider: [facility] [place] participating in Telemedicine visit: [place], NP Elmon Else, CMA  History of Present Illness: [name] is a 52 y.o. female who presents for follow-up.   11/17/2022 patient message: I'm a little concerned about this new medication can you call me to explain also I didn't realize you were putting me back on Prozac that medication didn't work for me please give me a call at [phone] to talk about my concerns please and thank you   11/19/2022: Patient reports she would like to know more about Metformin and Atorvastatin she was prescribed on 11/16/2022 after her lab results returned. Also, reports she is unable to take Zoloft, Prozac, or Prozac because it made her anxiety depression worse.     Past Medical History:  Diagnosis Date   Anemia    Anxiety    Arthritis    right knee, back   COPD (chronic obstructive pulmonary disease) (HCC)    GERD (gastroesophageal reflux disease)    Headache(784.0)    History of blood transfusion    Hx; of in 1991 after delivery   History of TIAs    Hypertension    Numbness    Right hand   Pneumonia    Stroke ([place]) 05/2013   Allergies  Allergen Reactions   Shellfish Allergy Itching and Swelling   Amoxicillin Other (See Comments)    Yeast infection   Latex Itching and Rash     Current Outpatient Medications on File Prior to Visit  Medication Sig Dispense Refill   metFORMIN (GLUCOPHAGE-XR) 500 MG 24 hr tablet Take 1 tablet (500 mg total) by mouth 2 (two) times daily with a meal. 60 tablet 0   albuterol (PROVENTIL) (2.5 MG/3ML) 0.083% nebulizer solution Take 3 mLs by nebulization every 6 (six) hours as needed for wheezing or shortness of breath. 180 mL 1   albuterol (VENTOLIN HFA) 108 (90 Base) MCG/ACT inhaler Inhale 2 puffs into the lungs every 6 (six) hours as needed for wheezing or shortness of breath. 8 g 5   amLODipine (NORVASC) 5 MG tablet Take 1 tablet (5 mg total) by mouth daily. 30 tablet 2   atorvastatin (LIPITOR) 20 MG tablet Take 1 tablet (20 mg total) by mouth daily. 30 tablet 2   baclofen (LIORESAL) 10 MG tablet Take 1 tablet (10 mg total) by mouth at bedtime as needed for muscle spasms. (Patient not taking: Reported on 09/24/2022) 5 each 0   benzonatate (TESSALON) 100 MG capsule Take 1 capsule (100 mg total) by mouth 3 (three) times daily as needed for cough. 30 capsule 0   COVID-19 mRNA vaccine 2023-2024 (COMIRNATY) syringe Inject into the muscle. 0.3 mL 0   diazepam (VALIUM) 5 MG tablet Take one tablet by mouth with food one hour prior to procedure. May repeat 30 minutes prior if needed. 2 tablet 0   doxycycline (VIBRA-TABS) 100 MG tablet Take  1 tablet (100 mg total) by mouth 2 (two) times daily. 14 tablet 0   ferrous sulfate 325 (65 FE) MG tablet Take 1 tablet (325 mg total) by mouth every other day. 90 tablet 0   FLUoxetine (PROZAC) 10 MG capsule Take 1 capsule (10 mg total) by mouth daily. 30 capsule 0   fluticasone-salmeterol (ADVAIR DISKUS) 250-50 MCG/ACT AEPB Inhale 1 puff into the lungs in the morning and at bedtime. 60 each 5   gabapentin (NEURONTIN) 100 MG capsule Take 1 capsule (100 mg total) by mouth 4 (four) times daily. 120 capsule 0   hydrOXYzine (VISTARIL) 25 MG capsule Take 1 capsule (25 mg total) by mouth every 8 (eight) hours as  needed. 30 capsule 1   omeprazole (PRILOSEC) 40 MG capsule Take 1 capsule (40 mg total) by mouth daily. 30 capsule 2   predniSONE (DELTASONE) 10 MG tablet Take 6 tablets (60 mg total) by mouth daily with breakfast for 1 day, THEN 5 tablets (50 mg total) daily with breakfast for 1 day, THEN 4 tablets (40 mg total) daily with breakfast for 1 day, THEN 3 tablets (30 mg total) daily with breakfast for 1 day, THEN 2 tablets (20 mg total) daily with breakfast for 1 day, THEN 1 tablet (10 mg total) daily with breakfast for 1 day. 21 tablet 0   Tiotropium Bromide Monohydrate (SPIRIVA RESPIMAT) 2.5 MCG/ACT AERS Inhale 2 puffs into the lungs daily. 4 g 5   No current facility-administered medications on file prior to visit.    Observations/Objective: Alert and oriented x 3. Not in acute distress. Physical examination not completed as this is a telemedicine visit.  Assessment and Plan: 1. Encounter to discuss test results - I discussed with patient in detail the importance of taking Metformin XR and Atorvastatin as prescribed for management of chronic conditions. Patient has an appointment with me on 12/13/2022. She is aware to keep that appointment and follow-up with me sooner if needed.    2. Anxiety and depression - Patient denies thoughts of self-harm, suicidal ideations, homicidal ideations. - Patient reports she is intolerant to Sertraline, Fluoxetine, and Escitalopram.  - Begin Citalopram as prescribed. Counseled on medication adherence.  - Follow-up with primary provider in 4 weeks or sooner if needed.  - citalopram (CELEXA) 10 MG tablet; Take 1 tablet (10 mg total) by mouth daily.  Dispense: 30 tablet; Refill: 0   Follow Up Instructions: Follow-up with primary provider as scheduled.   Patient was given clear instructions to go to Emergency Department or return to medical center if symptoms don't improve, worsen, or new problems develop.The patient verbalized understanding.  I discussed the  assessment and treatment plan with the patient. The patient was provided an opportunity to ask questions and all were answered. The patient agreed with the plan and demonstrated an understanding of the instructions.   The patient was advised to call back or seek an in-person evaluation if the symptoms worsen or if the condition fails to improve as anticipated.     I provided 7 minutes total of non-face-to-face time during this encounter.   Camillia Herter, NP  [facility] [place], [place] 11/19/2022, 10:01 AM

## 2022-11-24 ENCOUNTER — Ambulatory Visit (HOSPITAL_BASED_OUTPATIENT_CLINIC_OR_DEPARTMENT_OTHER): Payer: PRIVATE HEALTH INSURANCE | Admitting: Pulmonary Disease

## 2022-11-29 ENCOUNTER — Ambulatory Visit (INDEPENDENT_AMBULATORY_CARE_PROVIDER_SITE_OTHER): Payer: PRIVATE HEALTH INSURANCE | Admitting: Orthopedic Surgery

## 2022-11-29 ENCOUNTER — Encounter: Payer: Self-pay | Admitting: Orthopedic Surgery

## 2022-11-29 DIAGNOSIS — M5416 Radiculopathy, lumbar region: Secondary | ICD-10-CM

## 2022-11-29 MED ORDER — PREDNISONE 5 MG (21) PO TBPK: ORAL_TABLET | ORAL | 0 refills | Status: DC

## 2022-11-29 MED ORDER — METHOCARBAMOL 500 MG PO TABS: 500.0000 mg | ORAL_TABLET | Freq: Four times a day (QID) | ORAL | 0 refills | Status: DC

## 2022-11-29 NOTE — Progress Notes (Signed)
Office Visit Note   Patient: [name]           Date of Birth: 06-Aug-1971           MRN: [id number] Visit Date: 11/29/2022 Requested by: Camillia Herter, NP [place],  [place] [id number] PCP: Camillia Herter, NP  Subjective: Chief Complaint  Patient presents with   Left Leg - Follow-up, Pain    HPI: [name] is a 52 y.o. female who presents to the office reporting left knee leg and hip pain.  Going on for several months.  Denies any history of injury.  Really unable to lay on either side.  She has to sleep sitting up.  Does report radiating leg pain on the left-hand side with numbness and tingling in the foot.  No buttock pain.  Denies any groin pain.  She had radiographs of the hip and back in October which are reviewed.  Not too much arthritis in the back and no arthritis in the hip.  She works as a Administrator, [employer] shifts.  Hard for her to sleep at night.  Ibuprofen gives her minimal relief.                ROS: All systems reviewed are negative as they relate to the chief complaint within the history of present illness.  Patient denies fevers or chills.  Assessment & Plan: Visit Diagnoses:  1. Lumbar radiculopathy     Plan: Impression is left knee and leg pain.  Looks radicular in nature.  Plan is MRI scan lumbar spine to evaluate left-sided radiculopathy.  Symptoms ongoing for several months with failure of conservative treatment.  Medrol Dosepak and Robaxin also prescribed  Follow-Up Instructions: No follow-ups on file.   Orders:  Orders Placed This Encounter  Procedures   MR Lumbar Spine w/o contrast   Meds ordered this encounter  Medications   predniSONE (STERAPRED UNI-PAK 21 TAB) 5 MG (21) TBPK tablet    Sig: Take dosepak as directed    Dispense:  21 tablet    Refill:  0   methocarbamol (ROBAXIN) 500 MG tablet    Sig: Take 1 tablet (500 mg total) by mouth 4 (four) times daily.    Dispense:  30 tablet    Refill:  0       Procedures: No procedures performed   Clinical Data: No additional findings.  Objective: Vital Signs: There were no vitals taken for this visit.  Physical Exam:  Constitutional: Patient appears well-developed HEENT:  Head: Normocephalic Eyes:EOM are normal Neck: Normal range of motion Cardiovascular: Normal rate Pulmonary/chest: Effort normal Neurologic: Patient is alert Skin: Skin is warm Psychiatric: Patient has normal mood and affect  Ortho Exam: 5 out of 5 ankle dorsiflexion plantarflexion quad hamstring strength.  Does have mild trochanteric tenderness bilaterally.  Pedal pulses palpable.  Mild pain with forward lateral bending.  No groin pain with internal/external rotation of the leg.  No paresthesias L1-S1 bilaterally.  Specialty Comments:  MRI CERVICAL SPINE WITHOUT CONTRAST   TECHNIQUE: Multiplanar, multisequence MR imaging of the cervical spine was performed. No intravenous contrast was administered.   COMPARISON:  Cervical spine MRI 01/18/2014. Cervical spine radiographs 03/15/2022.   FINDINGS: Alignment: Chronic straightening of cervical lordosis, less reversal of lordosis compared to the preoperative MRI in 2015.   Vertebrae: ACDF hardware artifact now C4-C5 and C5-C6. Mild degenerative endplate marrow edema eccentric to the left at the adjacent C6-C7 segment (series 4, image  11). Background bone marrow signal within normal limits. No other No acute osseous abnormality identified.   Cord: No spinal cord signal abnormality despite degenerative cord mass effect detailed below. Negative visible upper thoracic canal and cord.   Posterior Fossa, vertebral arteries, paraspinal tissues: Cervicomedullary junction is within normal limits. Partially empty sella is chronic. Negative visible posterior fossa. Preserved major vascular flow voids in the neck. Right vertebral artery appears dominant as in 2015. Negative visible neck soft tissues and lung apices.    Disc levels:   C2-C3: Chronic disc bulging and endplate spurring eccentric to the right with moderate right side facet and ligament flavum hypertrophy. Borderline spinal stenosis now at this level. Moderate to severe right C3 foraminal stenosis has also progressed.   C3-C4: Progressed disc space loss with circumferential disc bulge, anterior endplate spurring, and lobulated posterior and slightly cephalad extrusion of disc (greater on the right series 7, image 7). Mild to moderate facet and ligament flavum hypertrophy has increased on the right. New mild spinal stenosis and mild ventral cord mass effect (same image). Mild to moderate bilateral C4 foraminal stenosis appears increased on the right.   C4-C5:  Interval ACDF. Resolved stenosis.   C5-C6: Interval ACDF. No stenosis. Residual endplate spurring on the left.   C6-C7: Interval disc space loss. Circumferential disc bulge and broad-based posterior component of disc (series 7, image 21). New mild spinal stenosis and ventral cord mass effect. Mild facet and ligament flavum hypertrophy. Mild to moderate bilateral C7 foraminal stenosis is new.   C7-T1:  Stable mild facet hypertrophy. No stenosis.   IMPRESSION: 1. ACDF C4-C5 and C5-C6 since a 2015 MRI with resolved stenosis at those levels.   2. But progressed degeneration at both adjacent segments: C3-C4 and C6-C7. New mild spinal stenosis AND spinal cord mass effect at both levels. No cord signal abnormality. Up to moderate bilateral foraminal stenosis at both levels.   3. Progressed C2-C3 degeneration with new borderline spinal stenosis and increased moderate to severe right C3 foraminal stenosis.     Electronically Signed   By: Genevie Ann M.D.   On: 03/30/2022 15:42  Imaging: No results found.   PMFS History: Patient Active Problem List   Diagnosis Date Noted   Hyperlipidemia 11/16/2022   Diabetes mellitus, type 2 ([place]) 11/16/2022   Trochanteric bursitis, left  hip 09/03/2022   Asthmatic bronchitis with acute exacerbation 04/09/2022   Chronic cough 01/27/2022   Right thyroid nodule 01/07/2022   Essential hypertension 01/05/2022   Migraine 01/05/2022   Thyroid nodule greater than or equal to 1.5 cm in diameter incidentally noted on imaging study 12/01/2021   Grief 02/06/2021   Non-intractable vomiting 02/06/2021   Arthritis of right knee    S/P knee replacement 01/06/2021   Morning headache 06/02/2017   Leiomyoma of body of uterus 12/24/2016   Prediabetes 11/11/2016   Pap smear abnormality of cervix with ASCUS favoring benign 01/01/2016   COPD (chronic obstructive pulmonary disease) ([place]) 11/18/2015   GERD (gastroesophageal reflux disease) 11/18/2015   Abnormal imaging of thyroid 11/04/2015   COPD exacerbation ([place]) 11/01/2015   Elevated d-dimer 11/01/2015   IDA (iron deficiency anemia) 09/04/2015   Patellofemoral disorder 04/04/2015   Insomnia 04/09/2014   S/P cervical spinal fusion 03/06/2014   Class 2 severe obesity due to excess calories with serious comorbidity and body mass index (BMI) of 37.0 to 37.9 in adult ([place]) 02/22/2014   Radicular pain in right arm 01/11/2014   Insomnia due to anxiety and fear  08/30/2013   Other and unspecified hyperlipidemia 08/24/2013   History of CVA (cerebrovascular accident) 05/18/2013   Tobacco abuse, 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   Past Medical History:  Diagnosis Date   Anemia    Anxiety    Arthritis    right knee, back   COPD (chronic obstructive pulmonary disease) (HCC)    GERD (gastroesophageal reflux disease)    Headache(784.0)    History of blood transfusion    Hx; of in 1991 after delivery   History of TIAs    Hypertension    Numbness    Right hand   Pneumonia    Stroke ([employer]) 05/2013    Family History  Problem Relation Age of Onset   Renal Disease Father        dialysis   Hypertension Father    Heart disease Mother    Heart disease Maternal Grandfather    Asthma Sister    Asthma  Maternal Aunt     Past Surgical History:  Procedure Laterality Date   ANTERIOR CERVICAL DECOMP/DISCECTOMY FUSION N/A 03/06/2014   Procedure: ANTERIOR CERVICAL DECOMPRESSION/DISCECTOMY FUSION 2 LEVELS four/five, five/six;  Surgeon: Eustace Moore, MD;  Location: [facility] NEURO ORS;  Service: Neurosurgery;  Laterality: N/A;   CESAREAN SECTION     x 1 with 5th pregnancy   CHOLECYSTECTOMY     KNEE ARTHROSCOPY Right 2015   TEE WITHOUT CARDIOVERSION N/A 05/21/2013   Procedure: TRANSESOPHAGEAL ECHOCARDIOGRAM (TEE);  Surgeon: Sueanne Margarita, MD;  Location: [facility];  Service: Cardiovascular;  Laterality: N/A;   TOTAL KNEE ARTHROPLASTY Right 01/06/2021   TOTAL KNEE ARTHROPLASTY Right 01/06/2021   Procedure: RIGHT TOTAL KNEE ARTHROPLASTY;  Surgeon: Meredith Pel, MD;  Location: [facility];  Service: Orthopedics;  Laterality: Right;   TUBAL LIGATION     Social History   Occupational History   Occupation: Forensic psychologist: [employer]  Tobacco Use   Smoking status: Former    Packs/day: 0.50    Years: 26.00    Total pack years: 13.00    Types: Cigarettes    Quit date: 10/09/2015    Years since quitting: 7.1    Passive exposure: Past   Smokeless tobacco: Former  Scientific laboratory technician Use: Never used  Substance and Sexual Activity   Alcohol use: Not Currently    Alcohol/week: 0.0 standard drinks of alcohol    Comment: occ   Drug use: No   Sexual activity: Not Currently    Birth control/protection: Surgical

## 2022-12-09 NOTE — Telephone Encounter (Signed)
I called patient today to introduce courtney hall and to assess patients' mental health needs. Patient did not answer the phone. I was able to leave a brief message with the patient asking them to return the call. Patient was referred by PCP for anxiety and depression

## 2022-12-11 ENCOUNTER — Other Ambulatory Visit: Payer: Self-pay | Admitting: Family

## 2022-12-11 DIAGNOSIS — E1165 Type 2 diabetes mellitus with hyperglycemia: Secondary | ICD-10-CM

## 2022-12-13 ENCOUNTER — Encounter: Payer: Self-pay | Admitting: Family

## 2022-12-13 ENCOUNTER — Ambulatory Visit (INDEPENDENT_AMBULATORY_CARE_PROVIDER_SITE_OTHER): Payer: PRIVATE HEALTH INSURANCE | Admitting: Family

## 2022-12-13 VITALS — BP 92/60 | HR 94 | Temp 98.3°F | Resp 16 | Ht 63.5 in | Wt 200.0 lb

## 2022-12-13 DIAGNOSIS — E1165 Type 2 diabetes mellitus with hyperglycemia: Secondary | ICD-10-CM

## 2022-12-13 DIAGNOSIS — E785 Hyperlipidemia, unspecified: Secondary | ICD-10-CM

## 2022-12-13 DIAGNOSIS — F32A Depression, unspecified: Secondary | ICD-10-CM | POA: Diagnosis not present

## 2022-12-13 DIAGNOSIS — F419 Anxiety disorder, unspecified: Secondary | ICD-10-CM | POA: Diagnosis not present

## 2022-12-13 LAB — POCT GLYCOSYLATED HEMOGLOBIN (HGB A1C): HbA1c, POC (controlled diabetic range): 7 % (ref 0.0–7.0)

## 2022-12-13 MED ORDER — CITALOPRAM HYDROBROMIDE 10 MG PO TABS: 10.0000 mg | ORAL_TABLET | Freq: Every day | ORAL | 0 refills | Status: DC

## 2022-12-13 NOTE — Progress Notes (Signed)
.  Pt presents for chronic care management

## 2022-12-13 NOTE — Addendum Note (Signed)
Addended by: Elmon Else on: 12/13/2022 02:23 PM   Modules accepted: Orders

## 2022-12-13 NOTE — Telephone Encounter (Signed)
Order complete. 

## 2022-12-13 NOTE — Progress Notes (Signed)
Patient ID: [name], female    DOB: 1971/06/08  MRN: [id number]  CC: Chronic Care Management   Subjective: [name] is a 52 y.o. female who presents for chronic care management.   Her concerns today include: - Doing well on Metformin XR, no issues/concerns. States sometimes she forgets to take the evening dose of Metformin.  - States she was not fasting the first time her cholesterol was checked. States today she is fasting. She has been taking Atorvastatin as prescribed.  - States she never began Celexa because the pharmacy said they didn't have it. Today the CMA confirmed with the patient that Celexa was sent to the pharmacy on 11/19/2022. I will resend today. She is a CNA/med-tech. States that she over thinks after leaving work. She is interested in an emotional support animal. She is agreeable to being referred to Psychiatry for supporting documentation for the same. States she plans to schedule an appointment with our office [facility] soon for counseling. She denies thoughts of self-harm, suicidal ideations, and homicidal ideations.      12/13/2022    1:13 PM 11/15/2022    1:34 PM 06/03/2022    9:08 AM 02/01/2022    9:12 AM 01/04/2022   11:05 AM  Depression screen PHQ 2/9  Decreased Interest 0 0 0 0 2  Down, Depressed, Hopeless 0 0 0 0 1  PHQ - 2 Score 0 0 0 0 3  Altered sleeping    0 1  Tired, decreased energy    0 2  Change in appetite    0 1  Feeling bad or failure about yourself     0 0  Trouble concentrating    0 0  Moving slowly or fidgety/restless    0 0  Suicidal thoughts    0 0  PHQ-9 Score    0 7     Patient Active Problem List   Diagnosis Date Noted   Hyperlipidemia 11/16/2022   Diabetes mellitus, type 2 ([place]) 11/16/2022   Trochanteric bursitis, left hip 09/03/2022   Asthmatic bronchitis with acute exacerbation 04/09/2022   Chronic cough 01/27/2022   Right thyroid nodule 01/07/2022   Essential hypertension 01/05/2022   Migraine 01/05/2022   Thyroid  nodule greater than or equal to 1.5 cm in diameter incidentally noted on imaging study 12/01/2021   Grief 02/06/2021   Non-intractable vomiting 02/06/2021   Arthritis of right knee    S/P knee replacement 01/06/2021   Morning headache 06/02/2017   Leiomyoma of body of uterus 12/24/2016   Prediabetes 11/11/2016   Pap smear abnormality of cervix with ASCUS favoring benign 01/01/2016   COPD (chronic obstructive pulmonary disease) ([place]) 11/18/2015   GERD (gastroesophageal reflux disease) 11/18/2015   Abnormal imaging of thyroid 11/04/2015   COPD exacerbation ([place]) 11/01/2015   Elevated d-dimer 11/01/2015   IDA (iron deficiency anemia) 09/04/2015   Patellofemoral disorder 04/04/2015   Insomnia 04/09/2014   S/P cervical spinal fusion 03/06/2014   Class 2 severe obesity due to excess calories with serious comorbidity and body mass index (BMI) of 37.0 to 37.9 in adult ([place]) 02/22/2014   Radicular pain in right arm 01/11/2014   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   Other and unspecified hyperlipidemia 08/24/2013   History of CVA (cerebrovascular accident) 05/18/2013   Tobacco abuse, 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     Current Outpatient Medications on File Prior to Visit  Medication Sig Dispense Refill   albuterol (PROVENTIL) (2.5 MG/3ML) 0.083% nebulizer solution Take  3 mLs by nebulization every 6 (six) hours as needed for wheezing or shortness of breath. 180 mL 1   albuterol (VENTOLIN HFA) 108 (90 Base) MCG/ACT inhaler Inhale 2 puffs into the lungs every 6 (six) hours as needed for wheezing or shortness of breath. 8 g 5   amLODipine (NORVASC) 5 MG tablet Take 1 tablet (5 mg total) by mouth daily. 30 tablet 2   atorvastatin (LIPITOR) 20 MG tablet Take 1 tablet (20 mg total) by mouth daily. 30 tablet 2   baclofen (LIORESAL) 10 MG tablet Take 1 tablet (10 mg total) by mouth at bedtime as needed for muscle spasms. (Patient not taking: Reported on 09/24/2022) 5 each 0   ferrous sulfate 325 (65  FE) MG tablet Take 1 tablet (325 mg total) by mouth every other day. 90 tablet 0   fluticasone-salmeterol (ADVAIR DISKUS) 250-50 MCG/ACT AEPB Inhale 1 puff into the lungs in the morning and at bedtime. 60 each 5   gabapentin (NEURONTIN) 100 MG capsule Take 1 capsule (100 mg total) by mouth 4 (four) times daily. 120 capsule 0   hydrOXYzine (VISTARIL) 25 MG capsule Take 1 capsule (25 mg total) by mouth every 8 (eight) hours as needed. 30 capsule 1   metFORMIN (GLUCOPHAGE-XR) 500 MG 24 hr tablet TAKE 1 TABLET BY MOUTH TWICE DAILY WITH A MEAL 60 tablet 2   methocarbamol (ROBAXIN) 500 MG tablet Take 1 tablet (500 mg total) by mouth 4 (four) times daily. 30 tablet 0   omeprazole (PRILOSEC) 40 MG capsule Take 1 capsule (40 mg total) by mouth daily. 30 capsule 2   Tiotropium Bromide Monohydrate (SPIRIVA RESPIMAT) 2.5 MCG/ACT AERS Inhale 2 puffs into the lungs daily. 4 g 5   No current facility-administered medications on file prior to visit.    Allergies  Allergen Reactions   Shellfish Allergy Itching and Swelling   Amoxicillin Other (See Comments)    Yeast infection   Latex Itching and Rash    Social History   Socioeconomic History   Marital status: Divorced    Spouse name: Not on file   Number of children: 7   Years of education: GEd   Highest education level: Not on file  Occupational History   Occupation: CNA    Employer: [employer]  Tobacco Use   Smoking status: Former    Packs/day: 0.50    Years: 26.00    Total pack years: 13.00    Types: Cigarettes    Quit date: 10/09/2015    Years since quitting: 7.1    Passive exposure: Past   Smokeless tobacco: Former  Scientific laboratory technician Use: Never used  Substance and Sexual Activity   Alcohol use: Not Currently    Alcohol/week: 0.0 standard drinks of alcohol    Comment: occ   Drug use: No   Sexual activity: Not Currently    Birth control/protection: Surgical  Other Topics Concern   Not on file  Social History Narrative    Patient lives at home with her family    She has 7 children   5 grown and out of the house   2 at home 75 and 14   She works as a Quarry manager at [employer] Strain: Not on [employer] Insecurity: Not on file  Transportation Needs: Not on file  Physical Activity: Not on file  Stress: Not on file  Social Connections: Not on file  Intimate  Partner Violence: Not on file    Family History  Problem Relation Age of Onset   Renal Disease Father        dialysis   Hypertension Father    Heart disease Mother    Heart disease Maternal Grandfather    Asthma Sister    Asthma Maternal Aunt     Past Surgical History:  Procedure Laterality Date   ANTERIOR CERVICAL DECOMP/DISCECTOMY FUSION N/A 03/06/2014   Procedure: ANTERIOR CERVICAL DECOMPRESSION/DISCECTOMY FUSION 2 LEVELS four/five, five/six;  Surgeon: Eustace Moore, MD;  Location: [facility] NEURO ORS;  Service: Neurosurgery;  Laterality: N/A;   CESAREAN SECTION     x 1 with 5th pregnancy   CHOLECYSTECTOMY     KNEE ARTHROSCOPY Right 2015   TEE WITHOUT CARDIOVERSION N/A 05/21/2013   Procedure: TRANSESOPHAGEAL ECHOCARDIOGRAM (TEE);  Surgeon: Sueanne Margarita, MD;  Location: [place];  Service: Cardiovascular;  Laterality: N/A;   TOTAL KNEE ARTHROPLASTY Right 01/06/2021   TOTAL KNEE ARTHROPLASTY Right 01/06/2021   Procedure: RIGHT TOTAL KNEE ARTHROPLASTY;  Surgeon: Meredith Pel, MD;  Location: [place];  Service: Orthopedics;  Laterality: Right;   TUBAL LIGATION      ROS: Review of Systems Negative except as stated above  PHYSICAL EXAM: BP 92/60 (BP Location: Left Arm, Patient Position: Sitting, Cuff Size: Large)   Pulse 94   Temp 98.3 F (36.8 C)   Resp 16   Ht 5' 3.5" (1.613 m)   Wt 200 lb (90.7 kg)   SpO2 95%   BMI 34.87 kg/m   Physical Exam HENT:     Head: Normocephalic and atraumatic.  Eyes:     Extraocular Movements: Extraocular movements intact.     Conjunctiva/sclera:  Conjunctivae normal.     Pupils: Pupils are equal, round, and reactive to light.  Cardiovascular:     Rate and Rhythm: Normal rate and regular rhythm.     Pulses: Normal pulses.     Heart sounds: Normal heart sounds.  Pulmonary:     Effort: Pulmonary effort is normal.     Breath sounds: Normal breath sounds.  Musculoskeletal:     Cervical back: Normal range of motion and neck supple.  Neurological:     General: No focal deficit present.     Mental Status: She is alert and oriented to person, place, and time.  Psychiatric:        Mood and Affect: Mood normal.        Behavior: Behavior normal.      ASSESSMENT AND PLAN: 1. Type 2 diabetes mellitus with hyperglycemia, without long-term current use of insulin (HCC) - Hemoglobin A1c 7.0%. This is increased from previous 6.8%.  - Patient reports she usually forgets to take the evening dose of Metformin XR. Discussed with patient to take both 500 mg doses of Metformin XR daily at breakfast.  - Continue Metformin as prescribed. No refills needed as of present. - Discussed the importance of healthy eating habits, low-carbohydrate diet, low-sugar diet, regular aerobic exercise (at least 150 minutes a week as tolerated) and medication compliance to achieve or maintain control of diabetes. - Routine microalbumin / creatinine urine ratio screening.  - Follow-up with primary provider in 4 weeks or sooner if needed.  - Microalbumin / creatinine urine ratio - POCT glycosylated hemoglobin (Hb A1C); Future  2. Hyperlipidemia, unspecified hyperlipidemia type - Routine screening.  - Lipid panel  3. Anxiety and depression - Patient denies thoughts of self-harm, suicidal ideations, homicidal ideations. - Citalopram as prescribed.  Counseled on medication adherence/adverse effects.  - Patient to schedule an appointment with Rosana Hoes, LCSWA for counseling/community resources.  - Referral to Psychiatry for further evaluation/management. During the  interim patient to follow-up with me in 4 weeks or sooner if needed.  - citalopram (CELEXA) 10 MG tablet; Take 1 tablet (10 mg total) by mouth daily.  Dispense: 30 tablet; Refill: 0 - Ambulatory referral to Psychiatry   Patient was given the opportunity to ask questions.  Patient verbalized understanding of the plan and was able to repeat key elements of the plan. Patient was given clear instructions to go to Emergency Department or return to medical center if symptoms don't improve, worsen, or new problems develop.The patient verbalized understanding.   Orders Placed This Encounter  Procedures   Lipid panel   Microalbumin / creatinine urine ratio   Ambulatory referral to Psychiatry   POCT glycosylated hemoglobin (Hb A1C)     Requested Prescriptions   Signed Prescriptions Disp Refills   citalopram (CELEXA) 10 MG tablet 30 tablet 0    Sig: Take 1 tablet (10 mg total) by mouth daily.    Return in about 4 weeks (around 01/10/2023) for Follow-Up or next available chronic care mgmt .  Camillia Herter, NP

## 2022-12-14 LAB — LIPID PANEL
Chol/HDL Ratio: 2.9 ratio (ref 0.0–4.4)
Cholesterol, Total: 212 mg/dL — ABNORMAL HIGH (ref 100–199)
HDL: 72 mg/dL (ref >39)
LDL Chol Calc (NIH): 117 mg/dL — ABNORMAL HIGH (ref 0–99)
Triglycerides: 133 mg/dL (ref 0–149)
VLDL Cholesterol Cal: 23 mg/dL (ref 5–40)

## 2022-12-15 LAB — MICROALBUMIN / CREATININE URINE RATIO
Creatinine, Urine: 174.1 mg/dL
Microalb/Creat Ratio: <2 mg/g creat (ref 0–29)
Microalbumin, Urine: <3 ug/mL

## 2022-12-20 ENCOUNTER — Ambulatory Visit
Admission: RE | Admit: 2022-12-20 | Discharge: 2022-12-20 | Disposition: A | Discharge status: Home / Self Care | Payer: No Typology Code available for payment source | Source: Ambulatory Visit | Attending: Orthopedic Surgery | Admitting: Orthopedic Surgery

## 2022-12-20 DIAGNOSIS — M5416 Radiculopathy, lumbar region: Secondary | ICD-10-CM

## 2022-12-21 ENCOUNTER — Encounter: Payer: Self-pay | Admitting: Licensed Clinical Social Worker

## 2022-12-28 ENCOUNTER — Encounter: Payer: Self-pay | Admitting: Family

## 2022-12-29 ENCOUNTER — Other Ambulatory Visit: Payer: Self-pay | Admitting: Family

## 2022-12-29 DIAGNOSIS — E119 Type 2 diabetes mellitus without complications: Secondary | ICD-10-CM

## 2022-12-29 NOTE — Telephone Encounter (Signed)
Order complete. Expect call within 2 weeks with appointment details.

## 2022-12-30 NOTE — Telephone Encounter (Unsigned)
Copied from [place] [id number]. Topic: General - Other >> Dec 30, 2022 11:36 AM Everette C wrote: Reason for CRM: The patient would like to be contacted by C. Hall when possible

## 2022-12-31 ENCOUNTER — Encounter: Payer: Self-pay | Admitting: Orthopedic Surgery

## 2022-12-31 ENCOUNTER — Ambulatory Visit (INDEPENDENT_AMBULATORY_CARE_PROVIDER_SITE_OTHER): Payer: No Typology Code available for payment source | Admitting: Orthopedic Surgery

## 2022-12-31 DIAGNOSIS — M5416 Radiculopathy, lumbar region: Secondary | ICD-10-CM

## 2022-12-31 MED ORDER — TRAMADOL HCL 50 MG PO TABS: 50.0000 mg | ORAL_TABLET | Freq: Three times a day (TID) | ORAL | 0 refills | Status: DC | PRN

## 2022-12-31 NOTE — Progress Notes (Signed)
Office Visit Note   Patient: [name]           Date of Birth: 1971/09/17           MRN: [id number] Visit Date: 12/31/2022 Requested by: Camillia Herter, NP [address] [place],  [place] [id number] PCP: Camillia Herter, NP  Subjective: Chief Complaint  Patient presents with   Other     Scan review    HPI: [name] is a 52 y.o. female who presents to the office reporting low back pain.  Left side is worse than the right.  She works as a Quarry manager.  Since she was last seen she had an MRI scan of the lumbar spine which shows single level L3-4 facet arthritis with mild spinal stenosis at that level.  This is a new finding compared to previous imaging.  Hard for her to sleep on either side.  She did have a shot in the trochanteric bursa which helped marginally..                ROS: All systems reviewed are negative as they relate to the chief complaint within the history of present illness.  Patient denies fevers or chills.  Assessment & Plan: Visit Diagnoses:  1. Lumbar radiculopathy     Plan: Impression is lumbar spine progressive severe bilateral facet arthropathy at L3-4 with new mild spinal canal and moderate left neuroforaminal stenosis.  Plan is referral to Dr. [name] for L-spine ESI and tramadol as well.  If this fails to alleviate her symptoms she should see Dr. Laurance Flatten for surgical evaluation and management.  Follow-Up Instructions: No follow-ups on file.   Orders:  Orders Placed This Encounter  Procedures   Ambulatory referral to Physical Medicine Rehab   Meds ordered this encounter  Medications   traMADol (ULTRAM) 50 MG tablet    Sig: Take 1 tablet (50 mg total) by mouth every 8 (eight) hours as needed.    Dispense:  30 tablet    Refill:  0      Procedures: No procedures performed   Clinical Data: No additional findings.  Objective: Vital Signs: There were no vitals taken for this visit.  Physical Exam:  Constitutional: Patient appears  well-developed HEENT:  Head: Normocephalic Eyes:EOM are normal Neck: Normal range of motion Cardiovascular: Normal rate Pulmonary/chest: Effort normal Neurologic: Patient is alert Skin: Skin is warm Psychiatric: Patient has normal mood and affect  Ortho Exam: Ortho exam demonstrates mild pain with forward lateral bending.  5 out of 5 ankle dorsiflexion plantarflexion quad hamstring strength with no paresthesias L1 S1 bilaterally.  Negative clonus.  Reflexes symmetric 0 1+ out of 4 bilateral patella and Achilles.  No real trochanteric tenderness is noted.  No muscle atrophy or wasting.  Specialty Comments:  MRI LUMBAR SPINE WITHOUT CONTRAST   TECHNIQUE: Multiplanar, multisequence MR imaging of the lumbar spine was performed. No intravenous contrast was administered.   COMPARISON:  Lumbar spine x-rays dated September 03, 2022. MRI lumbar spine dated July 08, 2018.   FINDINGS: Segmentation:  Standard.   Alignment:  Unchanged trace anterolisthesis at L3-L4.   Vertebrae:  No fracture, evidence of discitis, or bone lesion.   Conus medullaris and cauda equina: Conus extends to the L1-L2 level. Conus and cauda equina appear normal.   Paraspinal and other soft tissues: Negative.   Disc levels:   T12-L1:  Negative.   L1-L2: Negative disc. Mild bilateral facet arthropathy. No stenosis.   L2-L3:  Negative disc. Mild bilateral facet arthropathy. No stenosis.   L3-L4: Disc uncovering and minimal disc bulging. Progressive now severe bilateral facet arthropathy. New mild spinal canal and bilateral lateral recess stenosis. New moderate left and mild right neuroforaminal stenosis.   L4-L5: Negative disc. Progressive now moderate bilateral facet arthropathy. New mild right neuroforaminal stenosis. No spinal canal or left neuroforaminal stenosis.   L5-S1: Negative disc. Unchanged mild bilateral facet arthropathy. No stenosis.   IMPRESSION: 1. Progressive now severe bilateral facet  arthropathy at L3-L4 with new mild spinal canal and moderate left neuroforaminal stenosis.     Electronically Signed   By: Titus Dubin M.D.   On: 12/20/2022 13:35  Imaging: No results found.   PMFS History: Patient Active Problem List   Diagnosis Date Noted   Hyperlipidemia 11/16/2022   Diabetes mellitus, type 2 ([place]) 11/16/2022   Trochanteric bursitis, left hip 09/03/2022   Asthmatic bronchitis with acute exacerbation 04/09/2022   Chronic cough 01/27/2022   Right thyroid nodule 01/07/2022   Essential hypertension 01/05/2022   Migraine 01/05/2022   Thyroid nodule greater than or equal to 1.5 cm in diameter incidentally noted on imaging study 12/01/2021   Grief 02/06/2021   Non-intractable vomiting 02/06/2021   Arthritis of right knee    S/P knee replacement 01/06/2021   Morning headache 06/02/2017   Leiomyoma of body of uterus 12/24/2016   Prediabetes 11/11/2016   Pap smear abnormality of cervix with ASCUS favoring benign 01/01/2016   COPD (chronic obstructive pulmonary disease) ([place]) 11/18/2015   GERD (gastroesophageal reflux disease) 11/18/2015   Abnormal imaging of thyroid 11/04/2015   COPD exacerbation ([place]) 11/01/2015   Elevated d-dimer 11/01/2015   IDA (iron deficiency anemia) 09/04/2015   Patellofemoral disorder 04/04/2015   Insomnia 04/09/2014   S/P cervical spinal fusion 03/06/2014   Class 2 severe obesity due to excess calories with serious comorbidity and body mass index (BMI) of 37.0 to 37.9 in adult ([place]) 02/22/2014   Radicular pain in right arm 01/11/2014   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   Other and unspecified hyperlipidemia 08/24/2013   History of CVA (cerebrovascular accident) 05/18/2013   Tobacco abuse, 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   Past Medical History:  Diagnosis Date   Anemia    Anxiety    Arthritis    right knee, back   COPD (chronic obstructive pulmonary disease) (HCC)    GERD (gastroesophageal reflux disease)     Headache(784.0)    History of blood transfusion    Hx; of in 1991 after delivery   History of TIAs    Hypertension    Numbness    Right hand   Pneumonia    Stroke ([place]) 05/2013    Family History  Problem Relation Age of Onset   Renal Disease Father        dialysis   Hypertension Father    Heart disease Mother    Heart disease Maternal Grandfather    Asthma Sister    Asthma Maternal Aunt     Past Surgical History:  Procedure Laterality Date   ANTERIOR CERVICAL DECOMP/DISCECTOMY FUSION N/A 03/06/2014   Procedure: ANTERIOR CERVICAL DECOMPRESSION/DISCECTOMY FUSION 2 LEVELS four/five, five/six;  Surgeon: Eustace Moore, MD;  [facility]S;  Service: Neurosurgery;  Laterality: N/A;   CESAREAN SECTION     x 1 with 5th pregnancy   CHOLECYSTECTOMY     KNEE ARTHROSCOPY Right 2015   TEE WITHOUT CARDIOVERSION N/A 05/21/2013   Procedure: TRANSESOPHAGEAL ECHOCARDIOGRAM (TEE);  Surgeon: Tressia Miners  Remonia Richter, MD;  Location: [place];  Service: Cardiovascular;  Laterality: N/A;   TOTAL KNEE ARTHROPLASTY Right 01/06/2021   TOTAL KNEE ARTHROPLASTY Right 01/06/2021   Procedure: RIGHT TOTAL KNEE ARTHROPLASTY;  Surgeon: Meredith Pel, MD;  Location: [place];  Service: Orthopedics;  Laterality: Right;   TUBAL LIGATION     Social History   Occupational History   Occupation: Forensic psychologist: [employer]  Tobacco Use   Smoking status: Former    Packs/day: 0.50    Years: 26.00    Total pack years: 13.00    Types: Cigarettes    Quit date: 10/09/2015    Years since quitting: 7.2    Passive exposure: Past   Smokeless tobacco: Former  Scientific laboratory technician Use: Never used  Substance and Sexual Activity   Alcohol use: Not Currently    Alcohol/week: 0.0 standard drinks of alcohol    Comment: occ   Drug use: No   Sexual activity: Not Currently    Birth control/protection: Surgical

## 2023-01-06 ENCOUNTER — Encounter: Payer: Self-pay | Admitting: Family

## 2023-01-10 ENCOUNTER — Ambulatory Visit: Payer: Self-pay

## 2023-01-10 ENCOUNTER — Ambulatory Visit: Payer: No Typology Code available for payment source | Admitting: Physical Medicine and Rehabilitation

## 2023-01-10 VITALS — BP 122/85 | HR 90

## 2023-01-10 DIAGNOSIS — M5416 Radiculopathy, lumbar region: Secondary | ICD-10-CM

## 2023-01-10 MED ORDER — METHYLPREDNISOLONE ACETATE 80 MG/ML IJ SUSP
80.0000 mg | Freq: Once | INTRAMUSCULAR | Status: AC
  Administered 2023-01-10: 80 mg

## 2023-01-10 NOTE — Patient Instructions (Signed)

## 2023-01-10 NOTE — Progress Notes (Unsigned)
Functional Pain Scale - descriptive words and definitions  Distracting (5)    Aware of pain/able to complete some ADL's but limited by pain/sleep is affected and active distractions are only slightly useful. Moderate range order  Average Pain 6   +Driver, -BT, -Dye Allergies.  Lower back pain that radiates to bilateral hips and legs

## 2023-01-11 NOTE — Progress Notes (Signed)
[name] - 52 y.o. female MRN [id number]  Date of birth: 08/22/1971  Office Visit Note: Visit Date: 01/10/2023 PCP: Camillia Herter, NP Referred by: Camillia Herter, NP  Subjective: Chief Complaint  Patient presents with   Lower Back - Pain   HPI:  [name] is a 52 y.o. female who comes in today at the request of Dr. Anderson Malta for planned Left L4-5 Lumbar Interlaminar epidural steroid injection with fluoroscopic guidance.  The patient has failed conservative care including home exercise, medications, time and activity modification.  This injection will be diagnostic and hopefully therapeutic.  Please see requesting physician notes for further details and justification.   ROS Otherwise per HPI.  Assessment & Plan: Visit Diagnoses:    ICD-10-CM   1. Lumbar radiculopathy  M54.16 XR C-ARM NO REPORT    Epidural Steroid injection    methylPREDNISolone acetate (DEPO-MEDROL) injection 80 mg      Plan: No additional findings.   Meds & Orders:  Meds ordered this encounter  Medications   methylPREDNISolone acetate (DEPO-MEDROL) injection 80 mg    Orders Placed This Encounter  Procedures   XR C-ARM NO REPORT   Epidural Steroid injection    Follow-up: Return for visit to requesting provider as needed.   Procedures: No procedures performed  Lumbar Epidural Steroid Injection - Interlaminar Approach with Fluoroscopic Guidance  Patient: [name]      Date of Birth: 1971-07-19 MRN: [id number] PCP: Camillia Herter, NP      Visit Date: 01/10/2023   Universal Protocol:     Consent Given By: the patient  Position: PRONE  Additional Comments: Vital signs were monitored before and after the procedure. Patient was prepped and draped in the usual sterile fashion. The correct patient, procedure, and site was verified.   Injection Procedure Details:   Procedure diagnoses: Lumbar radiculopathy [M54.16]   Meds Administered:  Meds ordered this encounter   Medications   methylPREDNISolone acetate (DEPO-MEDROL) injection 80 mg     Laterality: Left  Location/Site:  L4-5  Needle: 3.5 in., 20 ga. Tuohy  Needle Placement: Paramedian epidural  Findings:   -Comments: Excellent flow of contrast into the epidural space.  Procedure Details: Using a paramedian approach from the side mentioned above, the region overlying the inferior lamina was localized under fluoroscopic visualization and the soft tissues overlying this structure were infiltrated with 4 ml. of 1% Lidocaine without Epinephrine. The Tuohy needle was inserted into the epidural space using a paramedian approach.   The epidural space was localized using loss of resistance along with counter oblique bi-planar fluoroscopic views.  After negative aspirate for air, blood, and CSF, a 2 ml. volume of Isovue-250 was injected into the epidural space and the flow of contrast was observed. Radiographs were obtained for documentation purposes.    The injectate was administered into the level noted above.   Additional Comments:  The patient tolerated the procedure well Dressing: 2 x 2 sterile gauze and Band-Aid    Post-procedure details: Patient was observed during the procedure. Post-procedure instructions were reviewed.  Patient left the clinic in stable condition.   Clinical History: MRI LUMBAR SPINE WITHOUT CONTRAST   TECHNIQUE: Multiplanar, multisequence MR imaging of the lumbar spine was performed. No intravenous contrast was administered.   COMPARISON:  Lumbar spine x-rays dated September 03, 2022. MRI lumbar spine dated July 08, 2018.   FINDINGS: Segmentation:  Standard.   Alignment:  Unchanged trace anterolisthesis at L3-L4.  Vertebrae:  No fracture, evidence of discitis, or bone lesion.   Conus medullaris and cauda equina: Conus extends to the L1-L2 level. Conus and cauda equina appear normal.   Paraspinal and other soft tissues: Negative.   Disc levels:    T12-L1:  Negative.   L1-L2: Negative disc. Mild bilateral facet arthropathy. No stenosis.   L2-L3: Negative disc. Mild bilateral facet arthropathy. No stenosis.   L3-L4: Disc uncovering and minimal disc bulging. Progressive now severe bilateral facet arthropathy. New mild spinal canal and bilateral lateral recess stenosis. New moderate left and mild right neuroforaminal stenosis.   L4-L5: Negative disc. Progressive now moderate bilateral facet arthropathy. New mild right neuroforaminal stenosis. No spinal canal or left neuroforaminal stenosis.   L5-S1: Negative disc. Unchanged mild bilateral facet arthropathy. No stenosis.   IMPRESSION: 1. Progressive now severe bilateral facet arthropathy at L3-L4 with new mild spinal canal and moderate left neuroforaminal stenosis.     Electronically Signed   By: Titus Dubin M.D.   On: 12/20/2022 13:35     Objective:  VS:  HT:    WT:   BMI:     BP:122/85  HR:90bpm  TEMP: ( )  RESP:  Physical Exam Vitals and nursing note reviewed.  Constitutional:      General: She is not in acute distress.    Appearance: Normal appearance. She is obese. She is not ill-appearing.  HENT:     Head: Normocephalic and atraumatic.     Right Ear: External ear normal.     Left Ear: External ear normal.  Eyes:     Extraocular Movements: Extraocular movements intact.  Cardiovascular:     Rate and Rhythm: Normal rate.     Pulses: Normal pulses.  Pulmonary:     Effort: Pulmonary effort is normal. No respiratory distress.  Abdominal:     General: There is no distension.     Palpations: Abdomen is soft.  Musculoskeletal:        General: Tenderness present.     Cervical back: Neck supple.     Right lower leg: No edema.     Left lower leg: No edema.     Comments: Patient has good distal strength with no pain over the greater trochanters.  No clonus or focal weakness.  Skin:    Findings: No erythema, lesion or rash.  Neurological:     General:  No focal deficit present.     Mental Status: She is alert and oriented to person, place, and time.     Sensory: No sensory deficit.     Motor: No weakness or abnormal muscle tone.     Coordination: Coordination normal.  Psychiatric:        Mood and Affect: Mood normal.        Behavior: Behavior normal.      Imaging: No results found.

## 2023-01-11 NOTE — Procedures (Signed)
Lumbar Epidural Steroid Injection - Interlaminar Approach with Fluoroscopic Guidance  Patient: [name]      Date of Birth: [birth year] MRN: [id number] PCP: Camillia Herter, NP      Visit Date: 01/10/2023   Universal Protocol:     Consent Given By: the patient  Position: PRONE  Additional Comments: Vital signs were monitored before and after the procedure. Patient was prepped and draped in the usual sterile fashion. The correct patient, procedure, and site was verified.   Injection Procedure Details:   Procedure diagnoses: Lumbar radiculopathy [M54.16]   Meds Administered:  Meds ordered this encounter  Medications   methylPREDNISolone acetate (DEPO-MEDROL) injection 80 mg     Laterality: Left  Location/Site:  L4-5  Needle: 3.5 in., 20 ga. Tuohy  Needle Placement: Paramedian epidural  Findings:   -Comments: Excellent flow of contrast into the epidural space.  Procedure Details: Using a paramedian approach from the side mentioned above, the region overlying the inferior lamina was localized under fluoroscopic visualization and the soft tissues overlying this structure were infiltrated with 4 ml. of 1% Lidocaine without Epinephrine. The Tuohy needle was inserted into the epidural space using a paramedian approach.   The epidural space was localized using loss of resistance along with counter oblique bi-planar fluoroscopic views.  After negative aspirate for air, blood, and CSF, a 2 ml. volume of Isovue-250 was injected into the epidural space and the flow of contrast was observed. Radiographs were obtained for documentation purposes.    The injectate was administered into the level noted above.   Additional Comments:  The patient tolerated the procedure well Dressing: 2 x 2 sterile gauze and Band-Aid    Post-procedure details: Patient was observed during the procedure. Post-procedure instructions were reviewed.  Patient left the clinic in stable condition.

## 2023-01-12 ENCOUNTER — Ambulatory Visit (HOSPITAL_BASED_OUTPATIENT_CLINIC_OR_DEPARTMENT_OTHER): Payer: No Typology Code available for payment source | Admitting: Licensed Clinical Social Worker

## 2023-01-12 DIAGNOSIS — F4323 Adjustment disorder with mixed anxiety and depressed mood: Secondary | ICD-10-CM

## 2023-01-13 ENCOUNTER — Other Ambulatory Visit: Payer: Self-pay | Admitting: Family

## 2023-01-13 DIAGNOSIS — F32A Depression, unspecified: Secondary | ICD-10-CM

## 2023-01-19 ENCOUNTER — Encounter: Payer: Self-pay | Admitting: Family

## 2023-01-19 ENCOUNTER — Other Ambulatory Visit: Payer: Self-pay

## 2023-01-19 DIAGNOSIS — E785 Hyperlipidemia, unspecified: Secondary | ICD-10-CM

## 2023-01-19 MED ORDER — ATORVASTATIN CALCIUM 20 MG PO TABS: 20.0000 mg | ORAL_TABLET | Freq: Every day | ORAL | 2 refills | Status: DC

## 2023-01-26 ENCOUNTER — Telehealth: Payer: Self-pay

## 2023-01-26 NOTE — Telephone Encounter (Signed)
Spoke with patient and she stated Dr. [name] informed her to call us if her pain has not gotten better in 2 weeks. She states its not better and its not worse. Please advise

## 2023-01-28 NOTE — Telephone Encounter (Signed)
Spoke with patient and scheduled OV for 01/31/23.

## 2023-01-31 ENCOUNTER — Encounter: Payer: Self-pay | Admitting: Physical Medicine and Rehabilitation

## 2023-01-31 ENCOUNTER — Ambulatory Visit (INDEPENDENT_AMBULATORY_CARE_PROVIDER_SITE_OTHER): Payer: No Typology Code available for payment source | Admitting: Physical Medicine and Rehabilitation

## 2023-01-31 VITALS — BP 127/86 | HR 85

## 2023-01-31 DIAGNOSIS — M5412 Radiculopathy, cervical region: Secondary | ICD-10-CM

## 2023-01-31 DIAGNOSIS — M7918 Myalgia, other site: Secondary | ICD-10-CM

## 2023-01-31 DIAGNOSIS — M5416 Radiculopathy, lumbar region: Secondary | ICD-10-CM | POA: Diagnosis not present

## 2023-01-31 MED ORDER — TRAMADOL HCL 50 MG PO TABS: 50.0000 mg | ORAL_TABLET | Freq: Three times a day (TID) | ORAL | 0 refills | Status: DC | PRN

## 2023-01-31 NOTE — Progress Notes (Signed)
[name] - 52 y.o. female MRN [id number]  Date of birth: 06/10/1971  Office Visit Note: Visit Date: 01/31/2023 PCP: Camillia Herter, NP Referred by: Camillia Herter, NP  Subjective: Chief Complaint  Patient presents with   Lower Back - Pain   HPI: [name] is a 52 y.o. female who comes in today for evaluation of chronic left sided back pain radiating to buttock and down left leg. Pain ongoing for several months and worsens with movement and activity. She describes pain as sore and aching, currently denies pain at this time. Some relief of pain with home exercise regimen, rest and use of medications. Recent lumbar MRI imaging exhibits severe bilateral facet arthropathy at L3-L4 with mild spinal canal and moderate left foraminal stenosis. No high grade spinal canal stenosis noted. She reports complete resolution of left lower back and left sided radicular symptoms with recent left L4-L5 interlaminar epidural steroid injection on 01/10/2023. She reports increased functionality post injection, 100% relief of pain. Patient denies focal weakness, numbness and tingling. No recent trauma or falls.   Chronic, worsening and severe left sided neck pain radiating to upper back, shoulder and down left arm. Neck pain is most severe pain today. Pain ongoing for several months. She describes pain as sore and throbbing., currently rates as 8 out of 10. Some relief of pain with home exercise regimen, rest and use of medications. Cervical MRI imaging from 2023 exhibits prior ACDF at C4-C5 and C5-C6, adjacent level disease at C3-C4 and C6-C7 where there is new mild spinal canal stenosis and spinal cord mass effect at both levels. Significant and sustained relief of pain until recently with previous left C7-T1 interlaminar epidural steroid injection on 09/21/2023. 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    Review of Systems  Musculoskeletal:   Positive for myalgias and neck pain. Negative for back pain.  Neurological:  Negative for weakness.  All other systems reviewed and are negative.  Otherwise per HPI.  Assessment & Plan: Visit Diagnoses:    ICD-10-CM   1. Lumbar radiculopathy  M54.16 Ambulatory referral to Physical Medicine Rehab    2. Radiculopathy, cervical region  M54.12 Ambulatory referral to Physical Medicine Rehab    3. Myofascial pain syndrome  M79.18 Ambulatory referral to Physical Medicine Rehab       Plan: Findings:  1. Chronic left sided back pain radiating to buttock and down left leg. Significant and sustained relief of pain with recent lumbar epidural steroid injection. Patients clinical presentation and exam are complex, her pain is to entire leg, most severe pain is consistent with L4 nerve pattern. Next step is to monitor. We can repeat injection infrequently as needed. I encouraged her to continue with conservative therapies as needed. No red flag symptoms noted upon exam today.   2.  Chronic, worsening and severe left-sided neck pain radiating to upper back, shoulder and down left arm.  Patient continues to have severe pain despite good conservative therapy such as home exercise regimen, rest and use of medications.  Patient's clinical presentation and exam are consistent with C6 nerve pattern.  Significant relief of pain with previous cervical epidural steroid injection.  Next step is to perform diagnostic and hopefully therapeutic left C7-T1 interlaminar epidural steroid injection under fluoroscopic guidance. She is not currently taking anticoagulants. We did discuss medication management and I prescribed short course of Tramadol until we can get her in for injection.  Meds & Orders:  Meds ordered this encounter  Medications   traMADol (ULTRAM) 50 MG tablet    Sig: Take 1 tablet (50 mg total) by mouth every 8 (eight) hours as needed for moderate pain or severe pain.    Dispense:  15 tablet    Refill:   0    Orders Placed This Encounter  Procedures   Ambulatory referral to Physical Medicine Rehab    Follow-up: Return for Left C7-T1 interlaminar epidural steroid injection.   Procedures: No procedures performed      Clinical History: MRI LUMBAR SPINE WITHOUT CONTRAST   TECHNIQUE: Multiplanar, multisequence MR imaging of the lumbar spine was performed. No intravenous contrast was administered.   COMPARISON:  Lumbar spine x-rays dated September 03, 2022. MRI lumbar spine dated July 08, 2018.   FINDINGS: Segmentation:  Standard.   Alignment:  Unchanged trace anterolisthesis at L3-L4.   Vertebrae:  No fracture, evidence of discitis, or bone lesion.   Conus medullaris and cauda equina: Conus extends to the L1-L2 level. Conus and cauda equina appear normal.   Paraspinal and other soft tissues: Negative.   Disc levels:   T12-L1:  Negative.   L1-L2: Negative disc. Mild bilateral facet arthropathy. No stenosis.   L2-L3: Negative disc. Mild bilateral facet arthropathy. No stenosis.   L3-L4: Disc uncovering and minimal disc bulging. Progressive now severe bilateral facet arthropathy. New mild spinal canal and bilateral lateral recess stenosis. New moderate left and mild right neuroforaminal stenosis.   L4-L5: Negative disc. Progressive now moderate bilateral facet arthropathy. New mild right neuroforaminal stenosis. No spinal canal or left neuroforaminal stenosis.   L5-S1: Negative disc. Unchanged mild bilateral facet arthropathy. No stenosis.   IMPRESSION: 1. Progressive now severe bilateral facet arthropathy at L3-L4 with new mild spinal canal and moderate left neuroforaminal stenosis.     Electronically Signed   By: Titus Dubin M.D.   On: 12/20/2022 13:35   She reports that she quit smoking about 7 years ago. Her smoking use included cigarettes. She has a 13.00 pack-year smoking history. She has been exposed to tobacco smoke. She has quit using smokeless  tobacco.  Recent Labs    11/15/22 1441 12/13/22 1419  HGBA1C 6.8* 7.0    Objective:  VS:  HT:    WT:   BMI:     BP:127/86  HR:85bpm  TEMP: ( )  RESP:  Physical Exam Vitals and nursing note reviewed.  HENT:     Head: Normocephalic and atraumatic.     Right Ear: External ear normal.     Left Ear: External ear normal.     Nose: Nose normal.     Mouth/Throat:     Mouth: Mucous membranes are moist.  Eyes:     Extraocular Movements: Extraocular movements intact.  Cardiovascular:     Rate and Rhythm: Normal rate.     Pulses: Normal pulses.  Pulmonary:     Effort: Pulmonary effort is normal.  Abdominal:     General: Abdomen is flat. There is no distension.  Musculoskeletal:        General: Tenderness present.     Cervical back: Tenderness present.     Comments: No discomfort noted with flexion, extension and side-to-side rotation. Patient has good strength in the upper extremities including 5 out of 5 strength in wrist extension, long finger flexion and APB.  There is no atrophy of the hands intrinsically. Sensation intact bilaterally. Dysesthesias noted to left C6 dermatome. Negative Hoffman's sign.  Skin:    General: Skin is warm and dry.     Capillary Refill: Capillary refill takes less than 2 seconds.  Neurological:     General: No focal deficit present.     Mental Status: She is alert and oriented to person, place, and time.  Psychiatric:        Mood and Affect: Mood normal.        Behavior: Behavior normal.     Ortho Exam  Imaging: No results found.  Past Medical/Family/Surgical/Social History: Medications & Allergies reviewed per EMR, new medications updated. Patient Active Problem List   Diagnosis Date Noted   Hyperlipidemia 11/16/2022   Diabetes mellitus, type 2 ([place]) 11/16/2022   Trochanteric bursitis, left hip 09/03/2022   Asthmatic bronchitis with acute exacerbation 04/09/2022   Chronic cough 01/27/2022   Right thyroid nodule 01/07/2022   Essential  hypertension 01/05/2022   Migraine 01/05/2022   Thyroid nodule greater than or equal to 1.5 cm in diameter incidentally noted on imaging study 12/01/2021   Grief 02/06/2021   Non-intractable vomiting 02/06/2021   Arthritis of right knee    S/P knee replacement 01/06/2021   Morning headache 06/02/2017   Leiomyoma of body of uterus 12/24/2016   Prediabetes 11/11/2016   Pap smear abnormality of cervix with ASCUS favoring benign 01/01/2016   COPD (chronic obstructive pulmonary disease) ([place]) 11/18/2015   GERD (gastroesophageal reflux disease) 11/18/2015   Abnormal imaging of thyroid 11/04/2015   COPD exacerbation ([place]) 11/01/2015   Elevated d-dimer 11/01/2015   IDA (iron deficiency anemia) 09/04/2015   Patellofemoral disorder 04/04/2015   Insomnia 04/09/2014   S/P cervical spinal fusion 03/06/2014   Class 2 severe obesity due to excess calories with serious comorbidity and body mass index (BMI) of 37.0 to 37.9 in adult ([address]) 02/22/2014   Radicular pain in right arm 01/11/2014   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   Other and unspecified hyperlipidemia 08/24/2013   History of CVA (cerebrovascular accident) 05/18/2013   Tobacco abuse, 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   Past Medical History:  Diagnosis Date   Anemia    Anxiety    Arthritis    right knee, back   COPD (chronic obstructive pulmonary disease) (HCC)    GERD (gastroesophageal reflux disease)    Headache(784.0)    History of blood transfusion    Hx; of in 1991 after delivery   History of TIAs    Hypertension    Numbness    Right hand   Pneumonia    Stroke ([place]) 05/2013   Family History  Problem Relation Age of Onset   Renal Disease Father        dialysis   Hypertension Father    Heart disease Mother    Heart disease Maternal Grandfather    Asthma Sister    Asthma Maternal Aunt    Past Surgical History:  Procedure Laterality Date   ANTERIOR CERVICAL DECOMP/DISCECTOMY FUSION N/A 03/06/2014   Procedure:  ANTERIOR CERVICAL DECOMPRESSION/DISCECTOMY FUSION 2 LEVELS four/five, five/six;  Surgeon: Eustace Moore, MD;  Location: [facility] NEURO ORS;  Service: Neurosurgery;  Laterality: N/A;   CESAREAN SECTION     x 1 with 5th pregnancy   CHOLECYSTECTOMY     KNEE ARTHROSCOPY Right 2015   TEE WITHOUT CARDIOVERSION N/A 05/21/2013   Procedure: TRANSESOPHAGEAL ECHOCARDIOGRAM (TEE);  Surgeon: Sueanne Margarita, MD;  Location: [facility];  Service: Cardiovascular;  Laterality: N/A;   TOTAL KNEE ARTHROPLASTY Right 01/06/2021   TOTAL KNEE ARTHROPLASTY Right 01/06/2021  Procedure: RIGHT TOTAL KNEE ARTHROPLASTY;  Surgeon: Meredith Pel, MD;  Location: [facility];  Service: Orthopedics;  Laterality: Right;   TUBAL LIGATION     Social History   Occupational History   Occupation: Forensic psychologist: [employer]  Tobacco Use   Smoking status: Former    Packs/day: 0.50    Years: 26.00    Additional pack years: 0.00    Total pack years: 13.00    Types: Cigarettes    Quit date: 10/09/2015    Years since quitting: 7.3    Passive exposure: Past   Smokeless tobacco: Former  Scientific laboratory technician Use: Never used  Substance and Sexual Activity   Alcohol use: Not Currently    Alcohol/week: 0.0 standard drinks of alcohol    Comment: occ   Drug use: No   Sexual activity: Not Currently    Birth control/protection: Surgical

## 2023-01-31 NOTE — BH Specialist Note (Addendum)
[facility] via Telemedicine Visit  01/12/2023 LADISLAVA PREGLER [id number]  Number of [facility] Clinician visits: 1- Initial Visit  Session Start time: [id number] (date of visit wa 01/12/23)   Session End time: 1125  Total time in minutes: 10   Referring Provider: PCP Patient/Family location: at home in bed [facility] Provider location: in office at [facility] All persons participating in visit: pt and therapist Types of Service: Video visit  I connected with [name] via  Video Enabled Telemedicine Application  (Video is Caregility application) and verified that I am speaking with the correct person using two identifiers. Discussed confidentiality: Yes   I discussed the limitations of telemedicine and the availability of in person appointments.  Discussed there is a possibility of technology failure and discussed alternative modes of communication if that failure occurs.  I discussed that engaging in this telemedicine visit, they consent to the provision of behavioral healthcare and the services will be billed under their insurance.  Patient and/or legal guardian expressed understanding and consented to Telemedicine visit: Yes   Presenting Concerns: Patient and/or family reports the following symptoms/concerns: symptoms of depression  Duration of problem: years; Severity of problem: moderate  Patient and/or Family's Strengths/Protective Factors: Concrete supports in place (healthy food, safe environments, etc.)  Goals Addressed: Patient will:  Reduce symptoms of: stress   Increase knowledge and/or ability of: stress reduction   Demonstrate ability to: Increase motivation to adhere to plan of care  Progress towards Goals: Ongoing  Interventions: Interventions utilized:  Supportive Counseling Standardized Assessments completed: Patient declined screening  Patient and/or Family Response: Patient reports that she has several children.  46 of her  [place] She deals with anxiety and worry.  She spoke about her granddaughter who is 10 who recently had a baby and she is concerned because the granddaughter is homeless.  She has offered to help to his granddaughter but often times the granddaughter can be very disrespectful as reported by patient.  Patient struggles to set boundaries with her children and grandchildren and other family members.  This is increased since her levels of stress and worry.  Patient states she feels obligated to help everyone.  Assessment: Patient currently experiencing symptoms of anxiety, worrying a lot and often in bed.   Patient may benefit from maintaining and developing boundaries with family and children. Seek outside help for her grand-baby who is homeless with the mother. Developing a self care routine, and continued support from [place].  Plan: Follow up with behavioral health clinician on : 4 weeks Behavioral recommendations: maintaining and developing boundaries with family and children. Seek outside help for her grand-baby who is homeless with the mother. Referral(s): [facility] (In Clinic)  I discussed the assessment and treatment plan with the patient and/or parent/guardian. They were provided an opportunity to ask questions and all were answered. They agreed with the plan and demonstrated an understanding of the instructions.   They were advised to call back or seek an in-person evaluation if the symptoms worsen or if the condition fails to improve as anticipated.  Shann Medal, LCSWA

## 2023-01-31 NOTE — Progress Notes (Unsigned)
Functional Pain Scale - descriptive words and definitions  Unmanageable (7)  Pain interferes with normal ADL's/nothing seems to help/sleep is very difficult/active distractions are very difficult to concentrate on. Severe range order  Average Pain 5  Lower back pain on left side that radiates in the left leg Pain in upper back near shoulder blades that radiates in left arm

## 2023-02-08 ENCOUNTER — Other Ambulatory Visit: Payer: Self-pay | Admitting: Family Medicine

## 2023-02-08 DIAGNOSIS — F419 Anxiety disorder, unspecified: Secondary | ICD-10-CM

## 2023-02-08 NOTE — Progress Notes (Signed)
Patient ID: [name], female    DOB: [birth year]  MRN: [id number]  CC: Diabetes Follow-Up  Subjective: [name] is a 52 y.o. female who presents for diabetes follow-up.   Her concerns today include:  - Taking Metformin daily at breakfast, no issues/concerns.  - Reports her blood pressure was elevated today at her Orthopedics appointment due to her having recently taken her blood pressure medication. She denies red flag symptoms such as but not limited to chest pain, shortness of breath, worst headache of life, nausea/vomiting. She does not check her blood pressure outside of office.  - Endorses cough, sinus congestion, and headaches. She has tried over-the-counter allergy medications and a few Prednisone she had at home without relief.  - Noticed her left eye twitching x 1 week. Vision normal. She denies associated red flag symptoms. States she wears eyeglasses and has contact information to eye doctors in the local area.  - Needs refills of anxiety medication. She has not established with Psychiatry as of yet due to she missed their attempts at calling her. She denies thoughts of self-harm, suicidal ideations, homicidal ideations. - Reports she felt like her acid reflux was about to "flare". Since then has stabilized. - No further issues/concerns for discussion today.   Patient Active Problem List   Diagnosis Date Noted   Hyperlipidemia 11/16/2022   Diabetes mellitus, type 2 11/16/2022   Trochanteric bursitis, left hip 09/03/2022   Asthmatic bronchitis with acute exacerbation 04/09/2022   Chronic cough 01/27/2022   Right thyroid nodule 01/07/2022   Essential hypertension 01/05/2022   Migraine 01/05/2022   Thyroid nodule greater than or equal to 1.5 cm in diameter incidentally noted on imaging study 12/01/2021   Grief 02/06/2021   Non-intractable vomiting 02/06/2021   Arthritis of right knee    S/P knee replacement 01/06/2021   Morning headache 06/02/2017   Leiomyoma of  body of uterus 12/24/2016   Prediabetes 11/11/2016   Pap smear abnormality of cervix with ASCUS favoring benign 01/01/2016   COPD (chronic obstructive pulmonary disease) 11/18/2015   GERD (gastroesophageal reflux disease) 11/18/2015   Abnormal imaging of thyroid 11/04/2015   COPD exacerbation 11/01/2015   Elevated d-dimer 11/01/2015   IDA (iron deficiency anemia) 09/04/2015   Patellofemoral disorder 04/04/2015   Insomnia 04/09/2014   S/P cervical spinal fusion 03/06/2014   Class 2 severe obesity due to excess calories with serious comorbidity and body mass index (BMI) of 37.0 to 37.9 in adult 02/22/2014   Radicular pain in right arm 01/11/2014   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   Other and unspecified hyperlipidemia 08/24/2013   History of CVA (cerebrovascular accident) 05/18/2013   Tobacco abuse, 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 180 mL 1   albuterol (VENTOLIN HFA) 108 (90 Base) MCG/ACT inhaler Inhale 2 puffs into the lungs every 6 (six) hours as needed for wheezing or shortness of breath. 8 g 5   atorvastatin (LIPITOR) 20 MG tablet Take 1 tablet (20 mg total) by mouth daily. 30 tablet 2   citalopram (CELEXA) 10 MG tablet Take 1 tablet by mouth once daily 90 tablet 0   ferrous sulfate 325 (65 FE) MG tablet Take 1 tablet (325 mg total) by mouth every other day. 90 tablet 0  fluticasone-salmeterol (ADVAIR DISKUS) 250-50 MCG/ACT AEPB Inhale 1 puff into the lungs in the morning and at bedtime. 60 each 5   gabapentin (NEURONTIN) 100 MG capsule Take 1 capsule (100 mg total) by mouth 4 (four) times daily. 120 capsule 0   metFORMIN (GLUCOPHAGE-XR) 500 MG 24 hr tablet TAKE 1 TABLET BY MOUTH TWICE DAILY WITH A MEAL 60 tablet 2   methocarbamol  (ROBAXIN) 500 MG tablet Take 1 tablet (500 mg total) by mouth 4 (four) times daily. 30 tablet 0   Tiotropium Bromide Monohydrate (SPIRIVA RESPIMAT) 2.5 MCG/ACT AERS Inhale 2 puffs into the lungs daily. 4 g 5   traMADol (ULTRAM) 50 MG tablet Take 1 tablet (50 mg total) by mouth every 8 (eight) hours as needed for moderate pain or severe pain. 15 tablet 0   No current facility-administered medications on file prior to visit.    Allergies  Allergen Reactions   Shellfish Allergy Itching and Swelling   Amoxicillin Other (See Comments)    Yeast infection   Latex Itching and Rash    Social History   Socioeconomic History   Marital status: Divorced    Spouse name: Not on file   Number of children: 7   Years of education: GEd   Highest education level: Not on file  Occupational History   Occupation: CNA    Employer: [employer]  Tobacco Use   Smoking status: Former    Packs/day: 0.50    Years: 26.00    Additional pack years: 0.00    Total pack years: 13.00    Types: Cigarettes    Quit date: 10/09/2015    Years since quitting: 7.3    Passive exposure: Past   Smokeless tobacco: Former  Building services engineer Use: Never used  Substance and Sexual Activity   Alcohol use: Not Currently    Alcohol/week: 0.0 standard drinks of alcohol    Comment: occ   Drug use: No   Sexual activity: Not Currently    Birth control/protection: Surgical  Other Topics Concern   Not on file  Social History Narrative   Patient lives at home with her family    She has 7 children   5 grown and out of the house   2 at home 47 and 14   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 Strain: Not on [employer] Insecurity: Not on file  Transportation Needs: Not on file  Physical Activity: Not on file  Stress: Not on file  Social Connections: Not on file  Intimate Partner Violence: Not on file    Family History  Problem Relation Age of Onset   Renal  Disease Father        dialysis   Hypertension Father    Heart disease Mother    Heart disease Maternal Grandfather    Asthma Sister    Asthma Maternal Aunt     Past Surgical History:  Procedure Laterality Date   ANTERIOR CERVICAL DECOMP/DISCECTOMY FUSION N/A 03/06/2014   Procedure: ANTERIOR CERVICAL DECOMPRESSION/DISCECTOMY FUSION 2 LEVELS four/five, five/six;  Surgeon: Tia Alert, MD;  Location: [facility] NEURO ORS;  Service: Neurosurgery;  Laterality: N/A;   CESAREAN SECTION     x 1 with 5th pregnancy   CHOLECYSTECTOMY     KNEE ARTHROSCOPY Right 2015   TEE WITHOUT CARDIOVERSION N/A 05/21/2013   Procedure: TRANSESOPHAGEAL ECHOCARDIOGRAM (TEE);  Surgeon: Quintella Reichert, MD;  Location: [facility]  ENDOSCOPY;  Service: Cardiovascular;  Laterality: N/A;   TOTAL KNEE ARTHROPLASTY Right 01/06/2021   TOTAL KNEE ARTHROPLASTY Right 01/06/2021   Procedure: RIGHT TOTAL KNEE ARTHROPLASTY;  Surgeon: Cammy Copa, MD;  Location: [facility] OR;  Service: Orthopedics;  Laterality: Right;   TUBAL LIGATION      ROS: Review of Systems Negative except as stated above  PHYSICAL EXAM: BP 135/86 (BP Location: Right Arm, Patient Position: Sitting, Cuff Size: Normal)   Pulse 66   Temp 98.1 F (36.7 C)   Resp 14   Ht 5\' 3"  (1.6 m)   Wt 194 lb 12.8 oz (88.4 kg)   BMI 34.51 kg/m   Physical Exam HENT:     Head: Normocephalic and atraumatic.     Nose: Nose normal.     Mouth/Throat:     Mouth: Mucous membranes are moist.     Pharynx: Oropharynx is clear.  Eyes:     Extraocular Movements: Extraocular movements intact.     Conjunctiva/sclera: Conjunctivae normal.     Pupils: Pupils are equal, round, and reactive to light.  Cardiovascular:     Rate and Rhythm: Normal rate and regular rhythm.     Pulses: Normal pulses.     Heart sounds: Normal heart sounds.  Pulmonary:     Effort: Pulmonary effort is normal.     Breath sounds: Normal breath sounds.  Musculoskeletal:     Cervical back: Normal range of motion  and neck supple.  Neurological:     General: No focal deficit present.     Mental Status: She is alert and oriented to person, place, and time.  Psychiatric:        Mood and Affect: Mood normal.        Behavior: Behavior normal.    ASSESSMENT AND PLAN: 1. Type 2 diabetes mellitus with hyperglycemia, without long-term current use of insulin - Continue Metformin XR as prescribed. No refills needed as of present.  - Discussed the importance of healthy eating habits, low-carbohydrate diet, low-sugar diet, regular aerobic exercise (at least 150 minutes a week as tolerated) and medication compliance to achieve or maintain control of diabetes. - Follow-up with primary provider in 4 weeks or sooner if needed.   2. Primary hypertension - Continue Amlodipine as prescribed.  - Patient encouraged to check blood pressure outside of office. - Counseled on blood pressure goal of less than 130/80, low-sodium, DASH diet, medication compliance, and 150 minutes of moderate intensity exercise per week as tolerated. Counseled on medication adherence and adverse effects. - Follow-up with primary provider in 6 months or sooner if needed.  - amLODipine (NORVASC) 5 MG tablet; Take 1 tablet (5 mg total) by mouth daily.  Dispense: 90 tablet; Refill: 0  3. Anxiety and depression - Patient denies thoughts of self-harm, suicidal ideations, homicidal ideations. - Continue Citalopram as prescribed. No refills needed as of present.  - Continue Hydroxyzine as prescribed. - Referral to Psychiatry for further evaluation/management. During the interim follow-up with primary provider as scheduled.  - hydrOXYzine (VISTARIL) 25 MG capsule; Take 1 capsule (25 mg total) by mouth every 8 (eight) hours as needed.  Dispense: 30 capsule; Refill: 1 - Ambulatory referral to Psychiatry  4. Gastroesophageal reflux disease, unspecified whether esophagitis present - Continue Omeprazole as prescribed.  - Follow-up with primary provider  as scheduled. - omeprazole (PRILOSEC) 40 MG capsule; Take 1 capsule (40 mg total) by mouth daily.  Dispense: 90 capsule; Refill: 0  5. Sinusitis, unspecified chronicity, unspecified location -  Prednisone and Doxycycline as prescribed. Counseled on medication adherence.  - Follow-up with primary provider as scheduled.  - predniSONE (DELTASONE) 10 MG tablet; Take 6 tablets (60 mg total) by mouth daily with breakfast for 1 day, THEN 5 tablets (50 mg total) daily with breakfast for 1 day, THEN 4 tablets (40 mg total) daily with breakfast for 1 day, THEN 3 tablets (30 mg total) daily with breakfast for 1 day, THEN 2 tablets (20 mg total) daily with breakfast for 1 day, THEN 1 tablet (10 mg total) daily with breakfast for 1 day.  Dispense: 21 tablet; Refill: 0 - doxycycline (VIBRA-TABS) 100 MG tablet; Take 1 tablet (100 mg total) by mouth 2 (two) times daily for 5 days.  Dispense: 10 tablet; Refill: 0  Patient was given the opportunity to ask questions.  Patient verbalized understanding of the plan and was able to repeat key elements of the plan. Patient was given clear instructions to go to Emergency Department or return to medical center if symptoms don't improve, worsen, or new problems develop.The patient verbalized understanding.   Orders Placed This Encounter  Procedures   Ambulatory referral to Psychiatry     Requested Prescriptions   Signed Prescriptions Disp Refills   omeprazole (PRILOSEC) 40 MG capsule 90 capsule 0    Sig: Take 1 capsule (40 mg total) by mouth daily.   amLODipine (NORVASC) 5 MG tablet 90 tablet 0    Sig: Take 1 tablet (5 mg total) by mouth daily.   predniSONE (DELTASONE) 10 MG tablet 21 tablet 0    Sig: Take 6 tablets (60 mg total) by mouth daily with breakfast for 1 day, THEN 5 tablets (50 mg total) daily with breakfast for 1 day, THEN 4 tablets (40 mg total) daily with breakfast for 1 day, THEN 3 tablets (30 mg total) daily with breakfast for 1 day, THEN 2 tablets (20  mg total) daily with breakfast for 1 day, THEN 1 tablet (10 mg total) daily with breakfast for 1 day.   doxycycline (VIBRA-TABS) 100 MG tablet 10 tablet 0    Sig: Take 1 tablet (100 mg total) by mouth 2 (two) times daily for 5 days.   hydrOXYzine (VISTARIL) 25 MG capsule 30 capsule 1    Sig: Take 1 capsule (25 mg total) by mouth every 8 (eight) hours as needed.    Return in about 4 weeks (around 03/14/2023) for Follow-Up or next available chronic care mgmt .  Rema Fendt, NP

## 2023-02-14 ENCOUNTER — Ambulatory Visit: Payer: No Typology Code available for payment source | Admitting: Physical Medicine and Rehabilitation

## 2023-02-14 ENCOUNTER — Ambulatory Visit (INDEPENDENT_AMBULATORY_CARE_PROVIDER_SITE_OTHER): Payer: No Typology Code available for payment source | Admitting: Family

## 2023-02-14 ENCOUNTER — Encounter: Payer: Self-pay | Admitting: Family

## 2023-02-14 ENCOUNTER — Ambulatory Visit: Payer: Self-pay

## 2023-02-14 VITALS — BP 135/86 | HR 66 | Temp 98.1°F | Resp 14 | Ht 63.0 in | Wt 194.8 lb

## 2023-02-14 VITALS — BP 159/100 | HR 67

## 2023-02-14 DIAGNOSIS — F32A Depression, unspecified: Secondary | ICD-10-CM

## 2023-02-14 DIAGNOSIS — J329 Chronic sinusitis, unspecified: Secondary | ICD-10-CM

## 2023-02-14 DIAGNOSIS — E1165 Type 2 diabetes mellitus with hyperglycemia: Secondary | ICD-10-CM | POA: Diagnosis not present

## 2023-02-14 DIAGNOSIS — K219 Gastro-esophageal reflux disease without esophagitis: Secondary | ICD-10-CM

## 2023-02-14 DIAGNOSIS — F419 Anxiety disorder, unspecified: Secondary | ICD-10-CM | POA: Diagnosis not present

## 2023-02-14 DIAGNOSIS — I10 Essential (primary) hypertension: Secondary | ICD-10-CM | POA: Diagnosis not present

## 2023-02-14 DIAGNOSIS — M7061 Trochanteric bursitis, right hip: Secondary | ICD-10-CM

## 2023-02-14 DIAGNOSIS — M542 Cervicalgia: Secondary | ICD-10-CM

## 2023-02-14 DIAGNOSIS — M5412 Radiculopathy, cervical region: Secondary | ICD-10-CM | POA: Diagnosis not present

## 2023-02-14 DIAGNOSIS — M47816 Spondylosis without myelopathy or radiculopathy, lumbar region: Secondary | ICD-10-CM

## 2023-02-14 DIAGNOSIS — M7062 Trochanteric bursitis, left hip: Secondary | ICD-10-CM

## 2023-02-14 MED ORDER — METHYLPREDNISOLONE ACETATE 80 MG/ML IJ SUSP
80.0000 mg | Freq: Once | INTRAMUSCULAR | Status: AC
  Administered 2023-02-14: 80 mg

## 2023-02-14 MED ORDER — OMEPRAZOLE 40 MG PO CPDR: 40.0000 mg | DELAYED_RELEASE_CAPSULE | Freq: Every day | ORAL | 0 refills | Status: DC

## 2023-02-14 MED ORDER — DOXYCYCLINE HYCLATE 100 MG PO TABS: 100.0000 mg | ORAL_TABLET | Freq: Two times a day (BID) | ORAL | 0 refills | Status: AC

## 2023-02-14 MED ORDER — HYDROXYZINE PAMOATE 25 MG PO CAPS
25.0000 mg | ORAL_CAPSULE | Freq: Three times a day (TID) | ORAL | 1 refills | Status: DC | PRN
Start: 2023-02-14 — End: 2023-05-25

## 2023-02-14 MED ORDER — AMLODIPINE BESYLATE 5 MG PO TABS: 5.0000 mg | ORAL_TABLET | Freq: Every day | ORAL | 0 refills | Status: DC

## 2023-02-14 MED ORDER — PREDNISONE 10 MG PO TABS: ORAL_TABLET | ORAL | 0 refills | Status: AC

## 2023-02-14 NOTE — Procedures (Signed)
Cervical Epidural Steroid Injection - Interlaminar Approach with Fluoroscopic Guidance  Patient: [name]      Date of Birth: May 27, 1971 MRN: [id number] PCP: Rema Fendt, NP      Visit Date: 02/14/2023   Universal Protocol:    Date/Time: 04/08/248:18 AM  Consent Given By: the patient  Position: PRONE  Additional Comments: Vital signs were monitored before and after the procedure. Patient was prepped and draped in the usual sterile fashion. The correct patient, procedure, and site was verified.   Injection Procedure Details:   Procedure diagnoses: Cervical radiculopathy [M54.12]    Meds Administered:  Meds ordered this encounter  Medications   methylPREDNISolone acetate (DEPO-MEDROL) injection 80 mg     Laterality: Left  Location/Site: C7-T1  Needle: 3.5 in., 20 ga. Tuohy  Needle Placement: Paramedian epidural space  Findings:  -Comments: Excellent flow of contrast into the epidural space.  Procedure Details: Using a paramedian approach from the side mentioned above, the region overlying the inferior lamina was localized under fluoroscopic visualization and the soft tissues overlying this structure were infiltrated with 4 ml. of 1% Lidocaine without Epinephrine. A # 20 gauge, Tuohy needle was inserted into the epidural space using a paramedian approach.  The epidural space was localized using loss of resistance along with contralateral oblique bi-planar fluoroscopic views.  After negative aspirate for air, blood, and CSF, a 2 ml. volume of Isovue-250 was injected into the epidural space and the flow of contrast was observed. Radiographs were obtained for documentation purposes.   The injectate was administered into the level noted above.  Additional Comments:  No complications occurred Dressing: 2 x 2 sterile gauze and Band-Aid    Post-procedure details: Patient was observed during the procedure. Post-procedure instructions were reviewed.  Patient left  the clinic in stable condition.

## 2023-02-14 NOTE — Patient Instructions (Signed)

## 2023-02-14 NOTE — Progress Notes (Signed)
[name] - 52 y.o. female MRN [id number]  Date of birth: [birth year]  Office Visit Note: Visit Date: 02/14/2023 PCP: Rema Fendt, NP Referred by: Rema Fendt, NP  Subjective: Chief Complaint  Patient presents with   Neck - Pain   HPI: [name] is a 52 y.o. female who comes in today  at the request of Ellin Goodie, FNP for planned Left C7-T1 Cervical Interlaminar epidural steroid injection with fluoroscopic guidance.  The patient has failed conservative care including home exercise, medications, time and activity modification.  This injection will be diagnostic and hopefully therapeutic.  Please see requesting physician notes for further details and justification.     ROS Otherwise per HPI.  Assessment & Plan: Visit Diagnoses:    ICD-10-CM   1. Cervical radiculopathy  M54.12 XR C-ARM NO REPORT    Epidural Steroid injection    methylPREDNISolone acetate (DEPO-MEDROL) injection 80 mg    2. Greater trochanteric bursitis, left  M70.62 Ambulatory referral to Physical Medicine Rehab    3. Greater trochanteric bursitis, right  M70.61 Ambulatory referral to Physical Medicine Rehab    4. Cervicalgia  M54.2     5. Spondylosis without myelopathy or radiculopathy, lumbar region  M47.816        Plan: No additional findings.   Meds & Orders:  Meds ordered this encounter  Medications   methylPREDNISolone acetate (DEPO-MEDROL) injection 80 mg    Orders Placed This Encounter  Procedures   XR C-ARM NO REPORT   Ambulatory referral to Physical Medicine Rehab   Epidural Steroid injection    Follow-up: Return for visit to requesting provider as needed.   Procedures: No procedures performed  Cervical Epidural Steroid Injection - Interlaminar Approach with Fluoroscopic Guidance  Patient: [name]      Date of Birth: Nov 06, 1971 MRN: [id number] PCP: Rema Fendt, NP      Visit Date: 02/14/2023   Universal Protocol:    Date/Time: 04/08/248:18  AM  Consent Given By: the patient  Position: PRONE  Additional Comments: Vital signs were monitored before and after the procedure. Patient was prepped and draped in the usual sterile fashion. The correct patient, procedure, and site was verified.   Injection Procedure Details:   Procedure diagnoses: Cervical radiculopathy [M54.12]    Meds Administered:  Meds ordered this encounter  Medications   methylPREDNISolone acetate (DEPO-MEDROL) injection 80 mg     Laterality: Left  Location/Site: C7-T1  Needle: 3.5 in., 20 ga. Tuohy  Needle Placement: Paramedian epidural space  Findings:  -Comments: Excellent flow of contrast into the epidural space.  Procedure Details: Using a paramedian approach from the side mentioned above, the region overlying the inferior lamina was localized under fluoroscopic visualization and the soft tissues overlying this structure were infiltrated with 4 ml. of 1% Lidocaine without Epinephrine. A # 20 gauge, Tuohy needle was inserted into the epidural space using a paramedian approach.  The epidural space was localized using loss of resistance along with contralateral oblique bi-planar fluoroscopic views.  After negative aspirate for air, blood, and CSF, a 2 ml. volume of Isovue-250 was injected into the epidural space and the flow of contrast was observed. Radiographs were obtained for documentation purposes.   The injectate was administered into the level noted above.  Additional Comments:  No complications occurred Dressing: 2 x 2 sterile gauze and Band-Aid    Post-procedure details: Patient was observed during the procedure. Post-procedure instructions were reviewed.  Patient left the clinic in  stable condition.   Clinical History: MRI LUMBAR SPINE WITHOUT CONTRAST   TECHNIQUE: Multiplanar, multisequence MR imaging of the lumbar spine was performed. No intravenous contrast was administered.   COMPARISON:  Lumbar spine x-rays dated  September 03, 2022. MRI lumbar spine dated July 08, 2018.   FINDINGS: Segmentation:  Standard.   Alignment:  Unchanged trace anterolisthesis at L3-L4.   Vertebrae:  No fracture, evidence of discitis, or bone lesion.   Conus medullaris and cauda equina: Conus extends to the L1-L2 level. Conus and cauda equina appear normal.   Paraspinal and other soft tissues: Negative.   Disc levels:   T12-L1:  Negative.   L1-L2: Negative disc. Mild bilateral facet arthropathy. No stenosis.   L2-L3: Negative disc. Mild bilateral facet arthropathy. No stenosis.   L3-L4: Disc uncovering and minimal disc bulging. Progressive now severe bilateral facet arthropathy. New mild spinal canal and bilateral lateral recess stenosis. New moderate left and mild right neuroforaminal stenosis.   L4-L5: Negative disc. Progressive now moderate bilateral facet arthropathy. New mild right neuroforaminal stenosis. No spinal canal or left neuroforaminal stenosis.   L5-S1: Negative disc. Unchanged mild bilateral facet arthropathy. No stenosis.   IMPRESSION: 1. Progressive now severe bilateral facet arthropathy at L3-L4 with new mild spinal canal and moderate left neuroforaminal stenosis.     Electronically Signed   By: Obie Dredge M.D.   On: 12/20/2022 13:35   She reports that she quit smoking about 7 years ago. Her smoking use included cigarettes. She has a 13.00 pack-year smoking history. She has been exposed to tobacco smoke. She has quit using smokeless tobacco.  Recent Labs    11/15/22 1441 12/13/22 1419  HGBA1C 6.8* 7.0    Objective:  VS:  HT:    WT:   BMI:     BP:(!) 159/100  HR:67bpm  TEMP: ( )  RESP:  Physical Exam Vitals and nursing note reviewed.  Constitutional:      General: She is not in acute distress.    Appearance: Normal appearance. She is not ill-appearing.  HENT:     Head: Normocephalic and atraumatic.     Right Ear: External ear normal.     Left Ear: External ear  normal.  Eyes:     Extraocular Movements: Extraocular movements intact.  Cardiovascular:     Rate and Rhythm: Normal rate.     Pulses: Normal pulses.  Musculoskeletal:     Cervical back: Tenderness present. No rigidity.     Right lower leg: No edema.     Left lower leg: No edema.     Comments: Patient has good strength in the upper extremities including 5 out of 5 strength in wrist extension long finger flexion and APB.  There is no atrophy of the hands intrinsically.  There is a negative Hoffmann's test.   Lymphadenopathy:     Cervical: No cervical adenopathy.  Skin:    Findings: No erythema, lesion or rash.  Neurological:     General: No focal deficit present.     Mental Status: She is alert and oriented to person, place, and time.     Sensory: No sensory deficit.     Motor: No weakness or abnormal muscle tone.     Coordination: Coordination normal.  Psychiatric:        Mood and Affect: Mood normal.        Behavior: Behavior normal.     Ortho Exam  Imaging: No results found.  Past Medical/Family/Surgical/Social History: Medications & Allergies  reviewed per EMR, new medications updated. Patient Active Problem List   Diagnosis Date Noted   Hyperlipidemia 11/16/2022   Diabetes mellitus, type 2 (HCC) 11/16/2022   Trochanteric bursitis, left hip 09/03/2022   Asthmatic bronchitis with acute exacerbation 04/09/2022   Chronic cough 01/27/2022   Right thyroid nodule 01/07/2022   Essential hypertension 01/05/2022   Migraine 01/05/2022   Thyroid nodule greater than or equal to 1.5 cm in diameter incidentally noted on imaging study 12/01/2021   Grief 02/06/2021   Non-intractable vomiting 02/06/2021   Arthritis of right knee    S/P knee replacement 01/06/2021   Morning headache 06/02/2017   Leiomyoma of body of uterus 12/24/2016   Prediabetes 11/11/2016   Pap smear abnormality of cervix with ASCUS favoring benign 01/01/2016   COPD (chronic obstructive pulmonary disease)  (HCC) 11/18/2015   GERD (gastroesophageal reflux disease) 11/18/2015   Abnormal imaging of thyroid 11/04/2015   COPD exacerbation (HCC) 11/01/2015   Elevated d-dimer 11/01/2015   IDA (iron deficiency anemia) 09/04/2015   Patellofemoral disorder 04/04/2015   Insomnia 04/09/2014   S/P cervical spinal fusion 03/06/2014   Class 2 severe obesity due to excess calories with serious comorbidity and body mass index (BMI) of 37.0 to 37.9 in adult (HCC) 02/22/2014   Radicular pain in right arm 01/11/2014   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   Other and unspecified hyperlipidemia 08/24/2013   History of CVA (cerebrovascular accident) 05/18/2013   Tobacco abuse, 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   Past Medical History:  Diagnosis Date   Anemia    Anxiety    Arthritis    right knee, back   COPD (chronic obstructive pulmonary disease) (HCC)    GERD (gastroesophageal reflux disease)    Headache(784.0)    History of blood transfusion    Hx; of in 1991 after delivery   History of TIAs    Hypertension    Numbness    Right hand   Pneumonia    Stroke (HCC) 05/2013   Family History  Problem Relation Age of Onset   Renal Disease Father        dialysis   Hypertension Father    Heart disease Mother    Heart disease Maternal Grandfather    Asthma Sister    Asthma Maternal Aunt    Past Surgical History:  Procedure Laterality Date   ANTERIOR CERVICAL DECOMP/DISCECTOMY FUSION N/A 03/06/2014   Procedure: ANTERIOR CERVICAL DECOMPRESSION/DISCECTOMY FUSION 2 LEVELS four/five, five/six;  Surgeon: Tia Alert, MD;  Location: [facility] NEURO ORS;  Service: Neurosurgery;  Laterality: N/A;   CESAREAN SECTION     x 1 with 5th pregnancy   CHOLECYSTECTOMY     KNEE ARTHROSCOPY Right 2015   TEE WITHOUT CARDIOVERSION N/A 05/21/2013   Procedure: TRANSESOPHAGEAL ECHOCARDIOGRAM (TEE);  Surgeon: Quintella Reichert, MD;  Location: [facility] ENDOSCOPY;  Service: Cardiovascular;  Laterality: N/A;   TOTAL KNEE ARTHROPLASTY Right  01/06/2021   TOTAL KNEE ARTHROPLASTY Right 01/06/2021   Procedure: RIGHT TOTAL KNEE ARTHROPLASTY;  Surgeon: Cammy Copa, MD;  Location: [facility] OR;  Service: Orthopedics;  Laterality: Right;   TUBAL LIGATION     Social History   Occupational History   Occupation: Scientist, research (medical): [employer]  Tobacco Use   Smoking status: Former    Packs/day: 0.50    Years: 26.00    Additional pack years: 0.00    Total pack years: 13.00    Types: Cigarettes    Quit date: 10/09/2015  Years since quitting: 7.4    Passive exposure: Past   Smokeless tobacco: Former  Building services engineer Use: Never used  Substance and Sexual Activity   Alcohol use: Not Currently    Alcohol/week: 0.0 standard drinks of alcohol    Comment: occ   Drug use: No   Sexual activity: Not Currently    Birth control/protection: Surgical

## 2023-02-14 NOTE — Progress Notes (Signed)
Pt is here for DM f/u   Complaining of elevated BP   Frequent headaches, pt states she thinks this is related to her BP being elevated   Issues with her acid reflux, wanting increase medication dose  Cough, sinus congestion and left eye itching X1 week

## 2023-02-14 NOTE — Progress Notes (Signed)
Functional Pain Scale - descriptive words and definitions  Distressing (6)    Pain is present/unable to complete most ADLs limited by pain/sleep is difficult and active distraction is only marginal. Moderate range order  Average Pain 6   +Driver, -BT, -Dye Allergies.  Left sided neck pain that radiates into arm

## 2023-02-16 ENCOUNTER — Ambulatory Visit (INDEPENDENT_AMBULATORY_CARE_PROVIDER_SITE_OTHER): Payer: No Typology Code available for payment source | Admitting: Licensed Clinical Social Worker

## 2023-02-16 DIAGNOSIS — F4323 Adjustment disorder with mixed anxiety and depressed mood: Secondary | ICD-10-CM

## 2023-02-16 NOTE — BH Specialist Note (Unsigned)
Integrated Behavioral Health via Telemedicine Visit  02/16/2023 ANCHAL MAKINO [id number]  Number of Integrated Behavioral Health Clinician visits: 2- Second Visit  Session Start time: 1007   Session End time: 1102  Total time in minutes: 55   Referring Provider: PCP Patient/Family location: At home [facility] [place] Provider location: In office at [facility] All persons participating in visit: pt and therapist Types of Service: Individual psychotherapy and Video visit  I connected with [name] via Video Enabled Telemedicine Application  (Video is Caregility application) and verified that I am speaking with the correct person using two identifiers. Discussed confidentiality: Yes   I discussed the limitations of telemedicine and the availability of in person appointments.  Discussed there is a possibility of technology failure and discussed alternative modes of communication if that failure occurs.  I discussed that engaging in this telemedicine visit, they consent to the provision of behavioral healthcare and the services will be billed under their insurance.  Patient and/or legal guardian expressed understanding and consented to Telemedicine visit: Yes   Presenting Concerns: Patient and/or family reports the following symptoms/concerns: symptoms of depression, tearful, lack of interest, isolation, and stress. Duration of problem: years; Severity of problem: mild  Patient and/or Family's Strengths/Protective Factors: Concrete supports in place (healthy food, safe environments, etc.)  Goals Addressed: Patient will:  Reduce symptoms of: stress   Increase knowledge and/or ability of: stress reduction   Demonstrate ability to: Increase motivation to adhere to plan of care  Progress towards Goals: Ongoing  Interventions: Interventions utilized:  Mindfulness or Relaxation Training and Supportive Counseling Standardized Assessments completed: Not Needed  Patient and/or Family  Response: Patient stated that her son has recently moved with her unexpectedly.  Patient states that her son in college recently got a ticket for driving without license.  Patient states that her boyfriend and her are having some issues.  One of her children that are in high school has not been going to school and is at risk for not graduating. Patient was receptive to navigating setting boundaries with her family and expectations.  Assessment: Patient currently experiencing symptoms of depression, tearful, lack of interest, isolation, and stress..   Patient may benefit from caring for a dog for emotionally support and setting boundaries for children and boyfriend.    Plan: Follow up with behavioral health clinician on : 4 weeks Behavioral recommendations: caring for a dog for emotionally support and setting boundaries for children and boyfriend.  Referral(s): [facility] [name] (In Clinic) and landlord for pet permission  I discussed the assessment and treatment plan with the patient and/or parent/guardian. They were provided an opportunity to ask questions and all were answered. They agreed with the plan and demonstrated an understanding of the instructions.   They were advised to call back or seek an in-person evaluation if the symptoms worsen or if the condition fails to improve as anticipated.  Vassie Loll, LCSWA

## 2023-02-24 ENCOUNTER — Ambulatory Visit (INDEPENDENT_AMBULATORY_CARE_PROVIDER_SITE_OTHER): Payer: No Typology Code available for payment source | Admitting: Physical Medicine and Rehabilitation

## 2023-02-24 ENCOUNTER — Other Ambulatory Visit: Payer: Self-pay

## 2023-02-24 DIAGNOSIS — M7062 Trochanteric bursitis, left hip: Secondary | ICD-10-CM | POA: Diagnosis not present

## 2023-02-24 DIAGNOSIS — M48061 Spinal stenosis, lumbar region without neurogenic claudication: Secondary | ICD-10-CM | POA: Diagnosis not present

## 2023-02-24 DIAGNOSIS — M7061 Trochanteric bursitis, right hip: Secondary | ICD-10-CM

## 2023-02-24 NOTE — Progress Notes (Signed)
Functional Pain Scale - descriptive words and definitions  Unmanageable (7)  Pain interferes with normal ADL's/nothing seems to help/sleep is very difficult/active distractions are very difficult to concentrate on. Severe range order  Average Pain 4   +Driver, -BT, -Dye Allergies.  Bilateral hip pain

## 2023-03-05 MED ORDER — TRIAMCINOLONE ACETONIDE 40 MG/ML IJ SUSP
40.0000 mg | INTRAMUSCULAR | Status: AC | PRN
Start: 2023-02-24 — End: 2023-02-24
  Administered 2023-02-24: 40 mg via INTRA_ARTICULAR

## 2023-03-05 MED ORDER — BUPIVACAINE HCL 0.25 % IJ SOLN
5.0000 mL | INTRAMUSCULAR | Status: AC | PRN
Start: 2023-02-24 — End: 2023-02-24
  Administered 2023-02-24: 5 mL via INTRA_ARTICULAR

## 2023-03-05 NOTE — Progress Notes (Signed)
[name] - 52 y.o. female MRN [id number]  Date of birth: [birth year]  Office Visit Note: Visit Date: 02/24/2023 PCP: Rema Fendt, NP Referred by: Rema Fendt, NP  Subjective: Chief Complaint  Patient presents with   Right Hip - Pain   Left Hip - Pain   HPI:  [name] is a 52 y.o. female who comes in today at the request of Ellin Goodie, FNP for planned Bilateral anesthetic trochanteric injections with fluoroscopic guidance.  The patient has failed conservative care including home exercise, medications, time and activity modification.  This injection will be diagnostic and hopefully therapeutic.  Please see requesting physician notes for further details and justification.   ROS Otherwise per HPI.  Assessment & Plan: Visit Diagnoses:    ICD-10-CM   1. Greater trochanteric bursitis, right  M70.61 XR C-ARM NO REPORT    2. Foraminal stenosis of lumbar region  M48.061 XR C-ARM NO REPORT      Plan: No additional findings.   Meds & Orders: No orders of the defined types were placed in this encounter.   Orders Placed This Encounter  Procedures   Large Joint Inj   XR C-ARM NO REPORT    Follow-up: Return for visit to requesting provider as needed.   Procedures: Large Joint Inj: bilateral greater trochanter on 02/24/2023 9:45 AM Indications: pain and diagnostic evaluation Details: 22 G 3.5 in needle, fluoroscopy-guided lateral approach  Arthrogram: No  Medications (Right): 40 mg triamcinolone acetonide 40 MG/ML; 5 mL bupivacaine 0.25 % Medications (Left): 40 mg triamcinolone acetonide 40 MG/ML; 5 mL bupivacaine 0.25 % Outcome: tolerated well, no immediate complications  There was excellent flow of contrast outlined the greater trochanteric bursa without vascular uptake. Procedure, treatment alternatives, risks and benefits explained, specific risks discussed. Consent was given by the patient. Immediately prior to procedure a time out was called to verify the  correct patient, procedure, equipment, support staff and site/side marked as required. Patient was prepped and draped in the usual sterile fashion.          Clinical History: MRI LUMBAR SPINE WITHOUT CONTRAST   TECHNIQUE: Multiplanar, multisequence MR imaging of the lumbar spine was performed. No intravenous contrast was administered.   COMPARISON:  Lumbar spine x-rays dated September 03, 2022. MRI lumbar spine dated July 08, 2018.   FINDINGS: Segmentation:  Standard.   Alignment:  Unchanged trace anterolisthesis at L3-L4.   Vertebrae:  No fracture, evidence of discitis, or bone lesion.   Conus medullaris and cauda equina: Conus extends to the L1-L2 level. Conus and cauda equina appear normal.   Paraspinal and other soft tissues: Negative.   Disc levels:   T12-L1:  Negative.   L1-L2: Negative disc. Mild bilateral facet arthropathy. No stenosis.   L2-L3: Negative disc. Mild bilateral facet arthropathy. No stenosis.   L3-L4: Disc uncovering and minimal disc bulging. Progressive now severe bilateral facet arthropathy. New mild spinal canal and bilateral lateral recess stenosis. New moderate left and mild right neuroforaminal stenosis.   L4-L5: Negative disc. Progressive now moderate bilateral facet arthropathy. New mild right neuroforaminal stenosis. No spinal canal or left neuroforaminal stenosis.   L5-S1: Negative disc. Unchanged mild bilateral facet arthropathy. No stenosis.   IMPRESSION: 1. Progressive now severe bilateral facet arthropathy at L3-L4 with new mild spinal canal and moderate left neuroforaminal stenosis.     Electronically Signed   By: Obie Dredge M.D.   On: 12/20/2022 13:35     Objective:  VS:  HT:    WT:   BMI:     BP:   HR: bpm  TEMP: ( )  RESP:  Physical Exam   Imaging: No results found.

## 2023-03-07 ENCOUNTER — Encounter: Payer: Self-pay | Admitting: Family

## 2023-03-10 NOTE — Progress Notes (Deleted)
Patient ID: [name], female    DOB: July 15, 1971  MRN: [id number]  CC: Annual Physical Exam  Subjective: [name] is a 52 y.o. female who presents for annual physical exam.  Her concerns today include:  Mammo  Pap  Colon ca   Endo referral for right thyroid nodule  DM - Metformin XR HTN - Amlodipine Anxiety depression - Citalopram, Psychiatry referral  GERD - Omeprazole     Patient Active Problem List   Diagnosis Date Noted   Hyperlipidemia 11/16/2022   Diabetes mellitus, type 2 (HCC) 11/16/2022   Trochanteric bursitis, left hip 09/03/2022   Asthmatic bronchitis with acute exacerbation 04/09/2022   Chronic cough 01/27/2022   Right thyroid nodule 01/07/2022   Essential hypertension 01/05/2022   Migraine 01/05/2022   Thyroid nodule greater than or equal to 1.5 cm in diameter incidentally noted on imaging study 12/01/2021   Grief 02/06/2021   Non-intractable vomiting 02/06/2021   Arthritis of right knee    S/P knee replacement 01/06/2021   Morning headache 06/02/2017   Leiomyoma of body of uterus 12/24/2016   Prediabetes 11/11/2016   Pap smear abnormality of cervix with ASCUS favoring benign 01/01/2016   COPD (chronic obstructive pulmonary disease) (HCC) 11/18/2015   GERD (gastroesophageal reflux disease) 11/18/2015   Abnormal imaging of thyroid 11/04/2015   COPD exacerbation (HCC) 11/01/2015   Elevated d-dimer 11/01/2015   IDA (iron deficiency anemia) 09/04/2015   Patellofemoral disorder 04/04/2015   Insomnia 04/09/2014   S/P cervical spinal fusion 03/06/2014   Class 2 severe obesity due to excess calories with serious comorbidity and body mass index (BMI) of 37.0 to 37.9 in adult (HCC) 02/22/2014   Radicular pain in right arm 01/11/2014   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   Other and unspecified hyperlipidemia 08/24/2013   History of CVA (cerebrovascular accident) 05/18/2013   Tobacco abuse, 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     Current Outpatient  Medications on File Prior to Visit  Medication Sig Dispense Refill   albuterol (PROVENTIL) (2.5 MG/3ML) 0.083% nebulizer solution Take 3 mLs by nebulization every 6 (six) hours as needed for wheezing or shortness of breath. 180 mL 1   albuterol (VENTOLIN HFA) 108 (90 Base) MCG/ACT inhaler Inhale 2 puffs into the lungs every 6 (six) hours as needed for wheezing or shortness of breath. 8 g 5   amLODipine (NORVASC) 5 MG tablet Take 1 tablet (5 mg total) by mouth daily. 90 tablet 0   atorvastatin (LIPITOR) 20 MG tablet Take 1 tablet (20 mg total) by mouth daily. 30 tablet 2   citalopram (CELEXA) 10 MG tablet Take 1 tablet by mouth once daily 90 tablet 0   ferrous sulfate 325 (65 FE) MG tablet Take 1 tablet (325 mg total) by mouth every other day. 90 tablet 0   fluticasone-salmeterol (ADVAIR DISKUS) 250-50 MCG/ACT AEPB Inhale 1 puff into the lungs in the morning and at bedtime. 60 each 5   gabapentin (NEURONTIN) 100 MG capsule Take 1 capsule (100 mg total) by mouth 4 (four) times daily. 120 capsule 0   hydrOXYzine (VISTARIL) 25 MG capsule Take 1 capsule (25 mg total) by mouth every 8 (eight) hours as needed. 30 capsule 1   metFORMIN (GLUCOPHAGE-XR) 500 MG 24 hr tablet TAKE 1 TABLET BY MOUTH TWICE DAILY WITH A MEAL 60 tablet 2   methocarbamol (ROBAXIN) 500 MG tablet Take 1 tablet (500 mg total) by mouth 4 (four) times daily. 30 tablet 0   omeprazole (PRILOSEC) 40  MG capsule Take 1 capsule (40 mg total) by mouth daily. 90 capsule 0   Tiotropium Bromide Monohydrate (SPIRIVA RESPIMAT) 2.5 MCG/ACT AERS Inhale 2 puffs into the lungs daily. 4 g 5   traMADol (ULTRAM) 50 MG tablet Take 1 tablet (50 mg total) by mouth every 8 (eight) hours as needed for moderate pain or severe pain. 15 tablet 0   No current facility-administered medications on file prior to visit.    Allergies  Allergen Reactions   Shellfish Allergy Itching and Swelling   Amoxicillin Other (See Comments)    Yeast infection   Latex Itching  and Rash    Social History   Socioeconomic History   Marital status: Divorced    Spouse name: Not on file   Number of children: 7   Years of education: GEd   Highest education level: Not on file  Occupational History   Occupation: CNA    Employer: [employer]  Tobacco Use   Smoking status: Former    Packs/day: 0.50    Years: 26.00    Additional pack years: 0.00    Total pack years: 13.00    Types: Cigarettes    Quit date: 10/09/2015    Years since quitting: 7.4    Passive exposure: Past   Smokeless tobacco: Former  Building services engineer Use: Never used  Substance and Sexual Activity   Alcohol use: Not Currently    Alcohol/week: 0.0 standard drinks of alcohol    Comment: occ   Drug use: No   Sexual activity: Not Currently    Birth control/protection: Surgical  Other Topics Concern   Not on file  Social History Narrative   Patient lives at home with her family    She has 7 children   5 grown and out of the house   2 at home 50 and 14   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 Strain: Not on [employer] Insecurity: Not on file  Transportation Needs: Not on file  Physical Activity: Not on file  Stress: Not on file  Social Connections: Not on file  Intimate Partner Violence: Not on file    Family History  Problem Relation Age of Onset   Renal Disease Father        dialysis   Hypertension Father    Heart disease Mother    Heart disease Maternal Grandfather    Asthma Sister    Asthma Maternal Aunt     Past Surgical History:  Procedure Laterality Date   ANTERIOR CERVICAL DECOMP/DISCECTOMY FUSION N/A 03/06/2014   Procedure: ANTERIOR CERVICAL DECOMPRESSION/DISCECTOMY FUSION 2 LEVELS four/five, five/six;  Surgeon: Tia Alert, MD;  Location: [facility] NEURO ORS;  Service: Neurosurgery;  Laterality: N/A;   CESAREAN SECTION     x 1 with 5th pregnancy   CHOLECYSTECTOMY     KNEE ARTHROSCOPY Right 2015   TEE WITHOUT  CARDIOVERSION N/A 05/21/2013   Procedure: TRANSESOPHAGEAL ECHOCARDIOGRAM (TEE);  Surgeon: Quintella Reichert, MD;  Location: [facility] ENDOSCOPY;  Service: Cardiovascular;  Laterality: N/A;   TOTAL KNEE ARTHROPLASTY Right 01/06/2021   TOTAL KNEE ARTHROPLASTY Right 01/06/2021   Procedure: RIGHT TOTAL KNEE ARTHROPLASTY;  Surgeon: Cammy Copa, MD;  Location: [facility] OR;  Service: Orthopedics;  Laterality: Right;   TUBAL LIGATION      ROS: Review of Systems Negative except as stated above  PHYSICAL EXAM: There were no vitals taken for this visit.  Physical Exam  {female adult master:310786} {female adult master:310785}     Latest Ref Rng & Units 11/15/2022    2:41 PM 06/03/2022   10:21 AM 01/02/2022    4:35 AM  CMP  Glucose 70 - 99 mg/dL 161  98  096   BUN 6 - 24 mg/dL 18  14  19    Creatinine 0.57 - 1.00 mg/dL 0.45  4.09  8.11   Sodium 134 - 144 mmol/L 142  140  136   Potassium 3.5 - 5.2 mmol/L 4.0  3.7  3.8   Chloride 96 - 106 mmol/L 105  102  101   CO2 20 - 29 mmol/L 19  24  27    Calcium 8.7 - 10.2 mg/dL 9.4  9.2  8.9   Total Protein 6.5 - 8.1 g/dL   7.7   Total Bilirubin 0.3 - 1.2 mg/dL   0.4   Alkaline Phos 38 - 126 U/L   72   AST 15 - 41 U/L   22   ALT 0 - 44 U/L   20    Lipid Panel     Component Value Date/Time   CHOL 212 (H) 12/13/2022 1414   TRIG 133 12/13/2022 1414   HDL 72 12/13/2022 1414   CHOLHDL 2.9 12/13/2022 1414   CHOLHDL 3.1 02/12/2020 0800   VLDL 18 09/04/2015 1242   LDLCALC 117 (H) 12/13/2022 1414   LDLCALC 121 (H) 02/12/2020 0800    CBC    Component Value Date/Time   WBC 7.9 01/02/2022 0435   RBC 5.34 (H) 01/02/2022 0435   HGB 12.6 01/02/2022 0435   HGB 12.2 02/09/2021 1515   HGB 12.4 12/15/2020 1137   HCT 40.5 01/02/2022 0435   HCT 40.5 12/15/2020 1137   PLT 310 01/02/2022 0435   PLT 248 02/09/2021 1515   PLT 308 12/15/2020 1137   MCV 75.8 (L) 01/02/2022 0435   MCV 75 (L) 12/15/2020 1137   MCH 23.6 (L) 01/02/2022 0435   MCHC 31.1 01/02/2022 0435    RDW 17.6 (H) 01/02/2022 0435   RDW 21.7 (H) 11/17/2020 1426   LYMPHSABS 2.4 01/02/2022 0435   LYMPHSABS 2.4 12/15/2020 1137   MONOABS 0.3 01/02/2022 0435   EOSABS 0.0 01/02/2022 0435   EOSABS 0.0 12/15/2020 1137   BASOSABS 0.0 01/02/2022 0435   BASOSABS 0.0 12/15/2020 1137    ASSESSMENT AND PLAN:  There are no diagnoses linked to this encounter.   Patient was given the opportunity to ask questions.  Patient verbalized understanding of the plan and was able to repeat key elements of the plan. Patient was given clear instructions to go to Emergency Department or return to medical center if symptoms don't improve, worsen, or new problems develop.The patient verbalized understanding.   No orders of the defined types were placed in this encounter.    Requested Prescriptions    No prescriptions requested or ordered in this encounter    No follow-ups on file.  Rema Fendt, NP

## 2023-03-11 ENCOUNTER — Other Ambulatory Visit: Payer: Self-pay | Admitting: Family

## 2023-03-11 DIAGNOSIS — E1165 Type 2 diabetes mellitus with hyperglycemia: Secondary | ICD-10-CM

## 2023-03-11 NOTE — Telephone Encounter (Signed)
Complete

## 2023-03-12 ENCOUNTER — Other Ambulatory Visit (HOSPITAL_BASED_OUTPATIENT_CLINIC_OR_DEPARTMENT_OTHER): Payer: Self-pay | Admitting: Pulmonary Disease

## 2023-03-12 DIAGNOSIS — J449 Chronic obstructive pulmonary disease, unspecified: Secondary | ICD-10-CM

## 2023-03-14 ENCOUNTER — Encounter: Payer: PRIVATE HEALTH INSURANCE | Admitting: Family

## 2023-03-14 DIAGNOSIS — Z113 Encounter for screening for infections with a predominantly sexual mode of transmission: Secondary | ICD-10-CM

## 2023-03-14 DIAGNOSIS — E1165 Type 2 diabetes mellitus with hyperglycemia: Secondary | ICD-10-CM

## 2023-03-14 DIAGNOSIS — Z124 Encounter for screening for malignant neoplasm of cervix: Secondary | ICD-10-CM

## 2023-03-14 DIAGNOSIS — Z1211 Encounter for screening for malignant neoplasm of colon: Secondary | ICD-10-CM

## 2023-03-14 DIAGNOSIS — Z1231 Encounter for screening mammogram for malignant neoplasm of breast: Secondary | ICD-10-CM

## 2023-03-14 DIAGNOSIS — E041 Nontoxic single thyroid nodule: Secondary | ICD-10-CM

## 2023-03-14 DIAGNOSIS — Z13228 Encounter for screening for other metabolic disorders: Secondary | ICD-10-CM

## 2023-03-14 DIAGNOSIS — Z130 Encounter for screening for diseases of the blood and blood-forming organs and certain disorders involving the immune mechanism: Secondary | ICD-10-CM

## 2023-03-14 DIAGNOSIS — Z1329 Encounter for screening for other suspected endocrine disorder: Secondary | ICD-10-CM

## 2023-03-14 DIAGNOSIS — Z0000 Encounter for general adult medical examination without abnormal findings: Secondary | ICD-10-CM

## 2023-03-15 MED ORDER — ALBUTEROL SULFATE (2.5 MG/3ML) 0.083% IN NEBU
3.0000 mL | INHALATION_SOLUTION | Freq: Four times a day (QID) | RESPIRATORY_TRACT | 0 refills | Status: DC | PRN
Start: 2023-03-15 — End: 2023-09-19

## 2023-03-15 NOTE — Telephone Encounter (Signed)
Albuterol neb refilled. Patient overdue for follow-up. Please contact to make appointment

## 2023-03-21 ENCOUNTER — Ambulatory Visit: Payer: No Typology Code available for payment source | Admitting: Licensed Clinical Social Worker

## 2023-03-21 DIAGNOSIS — F4323 Adjustment disorder with mixed anxiety and depressed mood: Secondary | ICD-10-CM

## 2023-04-18 ENCOUNTER — Ambulatory Visit (HOSPITAL_COMMUNITY): Payer: No Typology Code available for payment source | Admitting: Clinical

## 2023-04-19 DIAGNOSIS — F4323 Adjustment disorder with mixed anxiety and depressed mood: Secondary | ICD-10-CM | POA: Insufficient documentation

## 2023-04-19 NOTE — BH Specialist Note (Signed)
Integrated Behavioral Health via Telemedicine Visit  04/19/2023 [name] [id number]  Number of Integrated Behavioral Health Clinician visits: 3- Third Visit  Session Start time: 1000   Session End time: 1047  Total time in minutes: 47   Referring Provider: PCP Patient/Family location: At home [name] Provider location: In office at [facility] All persons participating in visit: pt and therapist Types of Service: Individual psychotherapy and Video visit  I connected with [name] via  Telephone or Video Enabled Telemedicine Application  (Video is Caregility application) and verified that I am speaking with the correct person using two identifiers. Discussed confidentiality: Yes   I discussed the limitations of telemedicine and the availability of in person appointments.  Discussed there is a possibility of technology failure and discussed alternative modes of communication if that failure occurs.  I discussed that engaging in this telemedicine visit, they consent to the provision of behavioral healthcare and the services will be billed under their insurance.  Patient and/or legal guardian expressed understanding and consented to Telemedicine visit: Yes   Presenting Concerns: Patient and/or family reports the following symptoms/concerns: symptoms of depression, tearful, lack of interest, isolation, and stress.  Duration of problem: years; Severity of problem: mild  Patient and/or Family's Strengths/Protective Factors: Concrete supports in place (healthy food, safe environments, etc.)  Goals Addressed: Patient will:  Reduce symptoms of: stress   Increase knowledge and/or ability of: stress reduction   Demonstrate ability to: Increase healthy adjustment to current Rowland circumstances  Progress towards Goals: Ongoing  Interventions: Interventions utilized:  Solution-Focused Strategies Standardized Assessments completed: Not Needed  Patient and/or Family Response: pt stated  that her partner recently broke up.  Patient stated that her son had to give their dog away because the dog did not have papers to stay in the housing. LCSWA helped patient with getting the correct documents for emotional support dog. Pt discussed that many of her stressors in her home have changed and she is hoping it stays that way.  Assessment: Patient currently experiencing sadness and stress.   Patient may benefit from continued support from [name] and proper documentation for her dog.  Plan: Follow up with behavioral health clinician on : 4 weeks Behavioral recommendations: continued support from [name] and proper documentation for her dog. Referral(s): Integrated [name] (In Clinic)  I discussed the assessment and treatment plan with the patient and/or parent/guardian. They were provided an opportunity to ask questions and all were answered. They agreed with the plan and demonstrated an understanding of the instructions.   They were advised to call back or seek an in-person evaluation if the symptoms worsen or if the condition fails to improve as anticipated.  Vassie Loll, LCSWA

## 2023-04-21 ENCOUNTER — Telehealth: Payer: Self-pay | Admitting: Physical Medicine and Rehabilitation

## 2023-04-21 DIAGNOSIS — M5416 Radiculopathy, lumbar region: Secondary | ICD-10-CM

## 2023-04-21 NOTE — Telephone Encounter (Signed)
Pt called requesting an appt for right arm injection. Please call pt at [phone]

## 2023-04-25 NOTE — Telephone Encounter (Signed)
Order for C7-T1 interlam, 30 min placed

## 2023-04-25 NOTE — Telephone Encounter (Signed)
Spoke with patient and she is requesting an injection. She stated the neck injection on 02/14/23 helped until recently and she had at least 75-80% relief. No new falls, accidents or injuries. Please advise

## 2023-04-27 ENCOUNTER — Ambulatory Visit
Admission: EM | Admit: 2023-04-27 | Discharge: 2023-04-27 | Disposition: A | Discharge status: Home / Self Care | Payer: No Typology Code available for payment source | Attending: Emergency Medicine | Admitting: Emergency Medicine

## 2023-04-27 DIAGNOSIS — J069 Acute upper respiratory infection, unspecified: Secondary | ICD-10-CM

## 2023-04-27 MED ORDER — PREDNISONE 20 MG PO TABS: 40.0000 mg | ORAL_TABLET | Freq: Every day | ORAL | 0 refills | Status: AC

## 2023-04-27 MED ORDER — PROMETHAZINE-DM 6.25-15 MG/5ML PO SYRP: 5.0000 mL | ORAL_SOLUTION | Freq: Four times a day (QID) | ORAL | 0 refills | Status: DC | PRN

## 2023-04-27 NOTE — ED Triage Notes (Addendum)
Patient with c/o shortness of breath, cough and congestion since about Friday. Patient states she tried a breathing treatment at home with no relief. Also used inhalers with no relief.

## 2023-04-27 NOTE — Discharge Instructions (Signed)
Use your steroid nasal spray (such as Flonase or Nasacort) per the directions on the package.  Use saline nasal spray several times a day but not the same time as the medicine spray; you do not want to wash the medicine out with a saline spray.  Try to help your head congestion drain your nose is much as possible.  Things like taking steamy showers, drinking hot tea, eating hot soup, or Vicks vapor rub can help.  Try gargling with salt water or throat sprays to help soothe your throat.

## 2023-04-27 NOTE — ED Provider Notes (Signed)
[facility]    CSN: [id number] Arrival date & time: 04/27/23  1607      History   Chief Complaint Chief Complaint  Patient presents with   Shortness of Breath   Cough    HPI [name] is a 52 y.o. female. Patient with c/o cough and congestion since about 04/22/23. Patient states she tried a breathing treatment at home with no relief. Also used inhalers with no relief. Thinks has bronchitis as she has had it before but denies SOB or wheezing. Does have a little nasal congestion and post nasal drainage. Is having coughing fits so severe that on occasion she vomits.    Shortness of Breath Associated symptoms: cough   Cough Associated symptoms: shortness of breath     Past Medical History:  Diagnosis Date   Anemia    Anxiety    Arthritis    right knee, back   COPD (chronic obstructive pulmonary disease) (HCC)    GERD (gastroesophageal reflux disease)    Headache(784.0)    History of blood transfusion    Hx; of in 1991 after delivery   History of TIAs    Hypertension    Numbness    Right hand   Pneumonia    Stroke (HCC) 05/2013    Patient Active Problem List   Diagnosis Date Noted   Adjustment disorder with mixed anxiety and depressed mood 04/19/2023   Hyperlipidemia 11/16/2022   Diabetes mellitus, type 2 (HCC) 11/16/2022   Trochanteric bursitis, left hip 09/03/2022   Asthmatic bronchitis with acute exacerbation 04/09/2022   Chronic cough 01/27/2022   Right thyroid nodule 01/07/2022   Essential hypertension 01/05/2022   Migraine 01/05/2022   Thyroid nodule greater than or equal to 1.5 cm in diameter incidentally noted on imaging study 12/01/2021   Grief 02/06/2021   Non-intractable vomiting 02/06/2021   Arthritis of right knee    S/P knee replacement 01/06/2021   Morning headache 06/02/2017   Leiomyoma of body of uterus 12/24/2016   Prediabetes 11/11/2016   Pap smear abnormality of cervix with ASCUS favoring benign 01/01/2016   COPD  (chronic obstructive pulmonary disease) (HCC) 11/18/2015   GERD (gastroesophageal reflux disease) 11/18/2015   Abnormal imaging of thyroid 11/04/2015   COPD exacerbation (HCC) 11/01/2015   Elevated d-dimer 11/01/2015   IDA (iron deficiency anemia) 09/04/2015   Patellofemoral disorder 04/04/2015   Insomnia 04/09/2014   S/P cervical spinal fusion 03/06/2014   Class 2 severe obesity due to excess calories with serious comorbidity and body mass index (BMI) of 37.0 to 37.9 in adult (HCC) 02/22/2014   Radicular pain in right arm 01/11/2014   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   Other and unspecified hyperlipidemia 08/24/2013   History of CVA (cerebrovascular accident) 05/18/2013   Tobacco abuse, 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    Past Surgical History:  Procedure Laterality Date   ANTERIOR CERVICAL DECOMP/DISCECTOMY FUSION N/A 03/06/2014   Procedure: ANTERIOR CERVICAL DECOMPRESSION/DISCECTOMY FUSION 2 LEVELS four/five, five/six;  Surgeon: Tia Alert, MD;  Location: [facility] NEURO ORS;  Service: Neurosurgery;  Laterality: N/A;   CESAREAN SECTION     x 1 with 5th pregnancy   CHOLECYSTECTOMY     KNEE ARTHROSCOPY Right 2015   TEE WITHOUT CARDIOVERSION N/A 05/21/2013   Procedure: TRANSESOPHAGEAL ECHOCARDIOGRAM (TEE);  Surgeon: Quintella Reichert, MD;  Location: [facility] ENDOSCOPY;  Service: Cardiovascular;  Laterality: N/A;   TOTAL KNEE ARTHROPLASTY Right 01/06/2021   TOTAL KNEE ARTHROPLASTY Right 01/06/2021   Procedure: RIGHT TOTAL  KNEE ARTHROPLASTY;  Surgeon: Cammy Copa, MD;  Location[facility] OR;  Service: Orthopedics;  Laterality: Right;   TUBAL LIGATION      OB History   No obstetric history on file.      Home Medications    Prior to Admission medications   Medication Sig Start Date End Date Taking? Authorizing Provider  predniSONE (DELTASONE) 20 MG tablet Take 2 tablets (40 mg total) by mouth daily for 5 days. 04/27/23 05/02/23 Yes Cathlyn Parsons, NP  promethazine-dextromethorphan  (PROMETHAZINE-DM) 6.25-15 MG/5ML syrup Take 5 mLs by mouth 4 (four) times daily as needed for cough. 04/27/23  Yes Cathlyn Parsons, NP  albuterol (PROVENTIL) (2.5 MG/3ML) 0.083% nebulizer solution Take 3 mLs by nebulization every 6 (six) hours as needed for wheezing or shortness of breath. 03/15/23   Luciano Cutter, MD  albuterol (VENTOLIN HFA) 108 (90 Base) MCG/ACT inhaler Inhale 2 puffs into the lungs every 6 (six) hours as needed for wheezing or shortness of breath. 09/24/22   Luciano Cutter, MD  amLODipine (NORVASC) 5 MG tablet Take 1 tablet (5 mg total) by mouth daily. 02/14/23 05/15/23  Rema Fendt, NP  atorvastatin (LIPITOR) 20 MG tablet Take 1 tablet (20 mg total) by mouth daily. 01/19/23 04/19/23  Rema Fendt, NP  citalopram (CELEXA) 10 MG tablet Take 1 tablet by mouth once daily 02/08/23   Rema Fendt, NP  ferrous sulfate 325 (65 FE) MG tablet Take 1 tablet (325 mg total) by mouth every other day. 01/05/22   Mayers, Cari S, PA-C  fluticasone-salmeterol (ADVAIR DISKUS) 250-50 MCG/ACT AEPB Inhale 1 puff into the lungs in the morning and at bedtime. 09/24/22   Luciano Cutter, MD  gabapentin (NEURONTIN) 100 MG capsule Take 1 capsule (100 mg total) by mouth 4 (four) times daily. 06/03/22   Rema Fendt, NP  hydrOXYzine (VISTARIL) 25 MG capsule Take 1 capsule (25 mg total) by mouth every 8 (eight) hours as needed. 02/14/23   Rema Fendt, NP  metFORMIN (GLUCOPHAGE-XR) 500 MG 24 hr tablet TAKE 1 TABLET BY MOUTH TWICE DAILY WITH A MEAL 03/11/23   Rema Fendt, NP  methocarbamol (ROBAXIN) 500 MG tablet Take 1 tablet (500 mg total) by mouth 4 (four) times daily. 11/29/22   Cammy Copa, MD  omeprazole (PRILOSEC) 40 MG capsule Take 1 capsule (40 mg total) by mouth daily. 02/14/23 05/15/23  Rema Fendt, NP  Tiotropium Bromide Monohydrate (SPIRIVA RESPIMAT) 2.5 MCG/ACT AERS Inhale 2 puffs into the lungs daily. 03/15/22   Luciano Cutter, MD  traMADol (ULTRAM) 50 MG tablet Take 1 tablet  (50 mg total) by mouth every 8 (eight) hours as needed for moderate pain or severe pain. 01/31/23   Juanda Chance, NP    Family History Family History  Problem Relation Age of Onset   Renal Disease Father        dialysis   Hypertension Father    Heart disease Mother    Heart disease Maternal Grandfather    Asthma Sister    Asthma Maternal Aunt     Social History Social History   Tobacco Use   Smoking status: Former    Packs/day: 0.50    Years: 26.00    Additional pack years: 0.00    Total pack years: 13.00    Types: Cigarettes    Quit date: 10/09/2015    Years since quitting: 7.5    Passive exposure: Past   Smokeless tobacco: Former  Vaping Use   Vaping Use: Never used  Substance Use Topics   Alcohol use: Not Currently    Alcohol/week: 0.0 standard drinks of alcohol    Comment: occ   Drug use: No     Allergies   Shellfish allergy, Amoxicillin, and Latex   Review of Systems Review of Systems  Respiratory:  Positive for cough and shortness of breath.      Physical Exam Triage Vital Signs ED Triage Vitals [04/27/23 1621]  Enc Vitals Group     BP (!) 160/95     Pulse Rate 88     Resp 20     Temp 98.2 F (36.8 C)     Temp Source Oral     SpO2 97 %     Weight      Height      Head Circumference      Peak Flow      Pain Score 0     Pain Loc      Pain Edu?      Excl. in GC?    No data found.  Updated Vital Signs BP (!) 160/95 (BP Location: Left Arm)   Pulse 88   Temp 98.2 F (36.8 C) (Oral)   Resp 20   SpO2 97%   Visual Acuity Right Eye Distance:   Left Eye Distance:   Bilateral Distance:    Right Eye Near:   Left Eye Near:    Bilateral Near:     Physical Exam Constitutional:      General: She is not in acute distress.    Appearance: She is well-developed. She is ill-appearing.  HENT:     Right Ear: Tympanic membrane, ear canal and external ear normal.     Left Ear: Tympanic membrane, ear canal and external ear normal.     Nose:  Congestion present.     Mouth/Throat:     Mouth: Mucous membranes are moist.     Pharynx: Oropharynx is clear. No oropharyngeal exudate or posterior oropharyngeal erythema.     Tonsils: 1+ on the right. 1+ on the left.  Cardiovascular:     Rate and Rhythm: Normal rate and regular rhythm.  Pulmonary:     Effort: Pulmonary effort is normal.     Breath sounds: Normal breath sounds.     Comments: Frequent coughing spasms Lymphadenopathy:     Head:     Right side of head: No submandibular adenopathy.     Left side of head: No submandibular adenopathy.     Cervical: No cervical adenopathy.  Neurological:     Mental Status: She is alert.      UC Treatments / Results  Labs (all labs ordered are listed, but only abnormal results are displayed) Labs Reviewed - No data to display  EKG   Radiology No results found.  Procedures Procedures (including critical care time)  Medications Ordered in UC Medications - No data to display  Initial Impression / Assessment and Plan / UC Course  I have reviewed the triage vital signs and the nursing notes.  Pertinent labs & imaging results that were available during my care of the patient were reviewed by me and considered in my medical decision making (see chart for details).    Pt's lungs are clear - I do not think she has bronchitis, and this is why her home use of breathing treatments/inhalers has not helped cough. I suspect post nasal drainage is triggering her gag/cough reflex and causing coughing spasms  with occasional post-tussive vomiting. Discussed options with pt. Rx prednisone and phenergan DM to help with cough. Pt to restart using of steroid nasal spray and saline nasal spray at home. Encouraged prn use of COPD breathing treatments as prescribed/when needed but pt does not have COPD exacerbation at this time.   Final Clinical Impressions(s) / UC Diagnoses   Final diagnoses:  Upper respiratory tract infection, unspecified type      Discharge Instructions      Use your steroid nasal spray (such as Flonase or Nasacort) per the directions on the package.  Use saline nasal spray several times a day but not the same time as the medicine spray; you do not want to wash the medicine out with a saline spray.  Try to help your head congestion drain your nose is much as possible.  Things like taking steamy showers, drinking hot tea, eating hot soup, or Vicks vapor rub can help.  Try gargling with salt water or throat sprays to help soothe your throat.   ED Prescriptions     Medication Sig Dispense Auth. Provider   promethazine-dextromethorphan (PROMETHAZINE-DM) 6.25-15 MG/5ML syrup Take 5 mLs by mouth 4 (four) times daily as needed for cough. 118 mL Cathlyn Parsons, NP   predniSONE (DELTASONE) 20 MG tablet Take 2 tablets (40 mg total) by mouth daily for 5 days. 10 tablet Cathlyn Parsons, NP      PDMP not reviewed this encounter.   Cathlyn Parsons, NP 04/27/23 [phone]

## 2023-05-06 ENCOUNTER — Other Ambulatory Visit: Payer: Self-pay | Admitting: Family

## 2023-05-06 DIAGNOSIS — E785 Hyperlipidemia, unspecified: Secondary | ICD-10-CM

## 2023-05-16 ENCOUNTER — Encounter: Payer: PRIVATE HEALTH INSURANCE | Admitting: Family

## 2023-05-16 ENCOUNTER — Encounter: Payer: Self-pay | Admitting: Family

## 2023-05-23 ENCOUNTER — Ambulatory Visit (HOSPITAL_COMMUNITY): Payer: No Typology Code available for payment source | Admitting: Clinical

## 2023-05-23 ENCOUNTER — Telehealth: Payer: Self-pay | Admitting: Family

## 2023-05-23 ENCOUNTER — Telehealth: Payer: Self-pay

## 2023-05-23 NOTE — Telephone Encounter (Signed)
error 

## 2023-05-23 NOTE — Telephone Encounter (Signed)
Pt would like to see Reginia Naas again.  Does she need a referral? In addition, would like a new referral to a different dr, like Dr Lucretia Field.  She missed her apt today w/ her.

## 2023-05-24 NOTE — Telephone Encounter (Signed)
Patient states Dr. Lucretia Field is a psychiatrist.  States she wasn't able to keep appointment on yesterday due to starting a new job.  She tried to call their office but there was no answer. She would like to see if she could reschedule with him.

## 2023-05-25 ENCOUNTER — Other Ambulatory Visit: Payer: Self-pay

## 2023-05-25 ENCOUNTER — Encounter: Payer: Self-pay | Admitting: Family

## 2023-05-25 ENCOUNTER — Ambulatory Visit (INDEPENDENT_AMBULATORY_CARE_PROVIDER_SITE_OTHER): Payer: 59 | Admitting: Family

## 2023-05-25 VITALS — BP 131/84 | HR 81 | Temp 98.6°F | Ht 63.5 in | Wt 198.4 lb

## 2023-05-25 DIAGNOSIS — G43909 Migraine, unspecified, not intractable, without status migrainosus: Secondary | ICD-10-CM | POA: Diagnosis not present

## 2023-05-25 DIAGNOSIS — E1165 Type 2 diabetes mellitus with hyperglycemia: Secondary | ICD-10-CM | POA: Diagnosis not present

## 2023-05-25 DIAGNOSIS — Z13228 Encounter for screening for other metabolic disorders: Secondary | ICD-10-CM

## 2023-05-25 DIAGNOSIS — I10 Essential (primary) hypertension: Secondary | ICD-10-CM

## 2023-05-25 DIAGNOSIS — E119 Type 2 diabetes mellitus without complications: Secondary | ICD-10-CM

## 2023-05-25 DIAGNOSIS — Z0001 Encounter for general adult medical examination with abnormal findings: Secondary | ICD-10-CM | POA: Diagnosis not present

## 2023-05-25 DIAGNOSIS — Z0000 Encounter for general adult medical examination without abnormal findings: Secondary | ICD-10-CM

## 2023-05-25 DIAGNOSIS — F419 Anxiety disorder, unspecified: Secondary | ICD-10-CM

## 2023-05-25 DIAGNOSIS — F32A Depression, unspecified: Secondary | ICD-10-CM | POA: Diagnosis not present

## 2023-05-25 DIAGNOSIS — Z1329 Encounter for screening for other suspected endocrine disorder: Secondary | ICD-10-CM

## 2023-05-25 DIAGNOSIS — Z7984 Long term (current) use of oral hypoglycemic drugs: Secondary | ICD-10-CM

## 2023-05-25 DIAGNOSIS — E785 Hyperlipidemia, unspecified: Secondary | ICD-10-CM | POA: Diagnosis not present

## 2023-05-25 DIAGNOSIS — Z124 Encounter for screening for malignant neoplasm of cervix: Secondary | ICD-10-CM

## 2023-05-25 DIAGNOSIS — K219 Gastro-esophageal reflux disease without esophagitis: Secondary | ICD-10-CM | POA: Diagnosis not present

## 2023-05-25 DIAGNOSIS — Z1231 Encounter for screening mammogram for malignant neoplasm of breast: Secondary | ICD-10-CM

## 2023-05-25 DIAGNOSIS — Z130 Encounter for screening for diseases of the blood and blood-forming organs and certain disorders involving the immune mechanism: Secondary | ICD-10-CM

## 2023-05-25 DIAGNOSIS — Z113 Encounter for screening for infections with a predominantly sexual mode of transmission: Secondary | ICD-10-CM

## 2023-05-25 DIAGNOSIS — Z1211 Encounter for screening for malignant neoplasm of colon: Secondary | ICD-10-CM

## 2023-05-25 MED ORDER — AMLODIPINE BESYLATE 5 MG PO TABS
5.0000 mg | ORAL_TABLET | Freq: Every day | ORAL | 0 refills | Status: DC
Start: 2023-05-25 — End: 2023-08-29

## 2023-05-25 MED ORDER — METFORMIN HCL ER 500 MG PO TB24
500.0000 mg | ORAL_TABLET | Freq: Two times a day (BID) | ORAL | 0 refills | Status: DC
Start: 2023-05-25 — End: 2023-09-20

## 2023-05-25 MED ORDER — SUMATRIPTAN SUCCINATE 25 MG PO TABS
ORAL_TABLET | ORAL | 1 refills | Status: DC
Start: 2023-05-25 — End: 2024-03-28

## 2023-05-25 MED ORDER — CITALOPRAM HYDROBROMIDE 20 MG PO TABS
20.0000 mg | ORAL_TABLET | Freq: Every day | ORAL | 2 refills | Status: DC
Start: 2023-05-25 — End: 2023-08-19

## 2023-05-25 MED ORDER — ATORVASTATIN CALCIUM 20 MG PO TABS
20.0000 mg | ORAL_TABLET | Freq: Every day | ORAL | 0 refills | Status: DC
Start: 2023-05-25 — End: 2023-09-19

## 2023-05-25 MED ORDER — OMEPRAZOLE 40 MG PO CPDR
40.0000 mg | DELAYED_RELEASE_CAPSULE | Freq: Every day | ORAL | 0 refills | Status: DC
Start: 2023-05-25 — End: 2023-08-29

## 2023-05-25 MED ORDER — HYDROXYZINE PAMOATE 25 MG PO CAPS
25.0000 mg | ORAL_CAPSULE | Freq: Three times a day (TID) | ORAL | 2 refills | Status: DC | PRN
Start: 2023-05-25 — End: 2023-09-19

## 2023-05-25 NOTE — Progress Notes (Signed)
Patient ID: [name], female    DOB: 04-18-1971  MRN: [id number]  CC: Annual Exam  Subjective: [name] is a 52 y.o. female who presents for annual exam.  Her concerns today include:  - Doing well on Amlodipine, no issues/concerns. She does not complain of red flag symptoms such as but not limited to chest pain, shortness of breath, worst headache of life, nausea/vomiting.  - Doing well on Metformin XR, no issues/concerns. She denies red flag symptoms associated with diabetes. - Doing well on Atorvastatin, no issues/concerns.  - Reports doesn't feel Citalopram and Hydroxyzine as effective as she would like. Requests new referral to Psychiatry due to missed appointment with initial referral because she began a new job.  - Doing well on Omeprazole, no issues/concerns.  - Needs an eye exam. Reports has bifocal glasses but doesn't wear them. - Intermittent headaches. Denies associated red flag symptoms. Taking over-the-counter medications with minimal relief.  - No further issues/concerns for discussion today.    Patient Active Problem List   Diagnosis Date Noted   Adjustment disorder with mixed anxiety and depressed mood 04/19/2023   Hyperlipidemia 11/16/2022   Diabetes mellitus, type 2 (HCC) 11/16/2022   Trochanteric bursitis, left hip 09/03/2022   Asthmatic bronchitis with acute exacerbation 04/09/2022   Chronic cough 01/27/2022   Right thyroid nodule 01/07/2022   Essential hypertension 01/05/2022   Migraine 01/05/2022   Thyroid nodule greater than or equal to 1.5 cm in diameter incidentally noted on imaging study 12/01/2021   Grief 02/06/2021   Non-intractable vomiting 02/06/2021   Arthritis of right knee    S/P knee replacement 01/06/2021   Morning headache 06/02/2017   Leiomyoma of body of uterus 12/24/2016   Prediabetes 11/11/2016   Pap smear abnormality of cervix with ASCUS favoring benign 01/01/2016   COPD (chronic obstructive pulmonary disease) (HCC)  11/18/2015   GERD (gastroesophageal reflux disease) 11/18/2015   Abnormal imaging of thyroid 11/04/2015   COPD exacerbation (HCC) 11/01/2015   Elevated d-dimer 11/01/2015   IDA (iron deficiency anemia) 09/04/2015   Patellofemoral disorder 04/04/2015   Insomnia 04/09/2014   S/P cervical spinal fusion 03/06/2014   Class 2 severe obesity due to excess calories with serious comorbidity and body mass index (BMI) of 37.0 to 37.9 in adult (HCC) 02/22/2014   Radicular pain in right arm 01/11/2014   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   Other and unspecified hyperlipidemia 08/24/2013   History of CVA (cerebrovascular accident) 05/18/2013   Tobacco abuse, 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 120 mL 0   albuterol (VENTOLIN HFA) 108 (90 Base) MCG/ACT inhaler Inhale 2 puffs into the lungs every 6 (six) hours as needed for wheezing or shortness of breath. 8 g 5   ferrous sulfate 325 (65 FE) MG tablet Take 1 tablet (325 mg total) by mouth every other day. 90 tablet 0   fluticasone-salmeterol (ADVAIR DISKUS) 250-50 MCG/ACT AEPB Inhale 1 puff into the lungs in the morning and at bedtime. 60 each 5   promethazine-dextromethorphan (PROMETHAZINE-DM) 6.25-15 MG/5ML syrup Take 5 mLs by mouth 4 (four) times daily as needed for cough. 118 mL 0   Tiotropium Bromide Monohydrate (SPIRIVA RESPIMAT) 2.5 MCG/ACT AERS Inhale 2 puffs into the lungs daily. 4 g 5  traMADol (ULTRAM) 50 MG tablet Take 1 tablet (50 mg total) by mouth every 8 (eight) hours as needed for moderate pain or severe pain. 15 tablet 0   gabapentin (NEURONTIN) 100 MG capsule Take 1 capsule (100 mg total) by mouth 4 (four) times daily. (Patient not taking: Reported on 05/25/2023) 120 capsule 0   methocarbamol  (ROBAXIN) 500 MG tablet Take 1 tablet (500 mg total) by mouth 4 (four) times daily. (Patient not taking: Reported on 05/25/2023) 30 tablet 0   No current facility-administered medications on file prior to visit.    Allergies  Allergen Reactions   Shellfish Allergy Itching and Swelling   Latex Itching and Rash    Social History   Socioeconomic History   Marital status: Divorced    Spouse name: Not on file   Number of children: 7   Years of education: GEd   Highest education level: Not on file  Occupational History   Occupation: CNA    Employer: [employer]  Tobacco Use   Smoking status: Former    Current packs/day: 0.00    Average packs/day: 0.5 packs/day for 26.0 years (13.0 ttl pk-yrs)    Types: Cigarettes    Start date: 10/08/1989    Quit date: 10/09/2015    Years since quitting: 7.6    Passive exposure: Past   Smokeless tobacco: Former  Building services engineer status: Never Used  Substance and Sexual Activity   Alcohol use: Not Currently    Alcohol/week: 0.0 standard drinks of alcohol    Comment: occ   Drug use: No   Sexual activity: Not Currently    Birth control/protection: Surgical  Other Topics Concern   Not on file  Social History Narrative   Patient lives at home with her family    She has 7 children   5 grown and out of the house   2 at home 28 and 14   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 Strain: Not on [employer] Insecurity: Not on file  Transportation Needs: Not on file  Physical Activity: Not on file  Stress: Not on file  Social Connections: Not on file  Intimate Partner Violence: Not on file    Family History  Problem Relation Age of Onset   Renal Disease Father        dialysis   Hypertension Father    Heart disease Mother    Heart disease Maternal Grandfather    Asthma Sister    Asthma Maternal Aunt     Past Surgical History:  Procedure Laterality Date   ANTERIOR CERVICAL  DECOMP/DISCECTOMY FUSION N/A 03/06/2014   Procedure: ANTERIOR CERVICAL DECOMPRESSION/DISCECTOMY FUSION 2 LEVELS four/five, five/six;  Surgeon: Tia Alert, MD;  Location[facility] NEURO ORS;  Service: Neurosurgery;  Laterality: N/A;   CESAREAN SECTION     x 1 with 5th pregnancy   CHOLECYSTECTOMY     KNEE ARTHROSCOPY Right 2015   TEE WITHOUT CARDIOVERSION N/A 05/21/2013   Procedure: TRANSESOPHAGEAL ECHOCARDIOGRAM (TEE);  Surgeon: Quintella Reichert, MD;  Location: [facility] ENDOSCOPY;  Service: Cardiovascular;  Laterality: N/A;   TOTAL KNEE ARTHROPLASTY Right 01/06/2021   TOTAL KNEE ARTHROPLASTY Right 01/06/2021   Procedure: RIGHT TOTAL KNEE ARTHROPLASTY;  Surgeon: Cammy Copa, MD;  Location: [facility] OR;  Service: Orthopedics;  Laterality: Right;   TUBAL LIGATION      ROS: Review of Systems Negative except as stated above  PHYSICAL EXAM: BP 131/84   Pulse 81   Temp 98.6 F (37 C) (Oral)   Ht 5' 3.5" (1.613 m)   Wt 198 lb 6.4 oz (90 kg)   SpO2 92%   BMI 34.59 kg/m   Physical Exam HENT:     Head: Normocephalic and atraumatic.     Right Ear: Tympanic membrane, ear canal and external ear normal.     Left Ear: Tympanic membrane, ear canal and external ear normal.     Nose: Nose normal.     Mouth/Throat:     Mouth: Mucous membranes are moist.     Pharynx: Oropharynx is clear.  Eyes:     Extraocular Movements: Extraocular movements intact.     Conjunctiva/sclera: Conjunctivae normal.     Pupils: Pupils are equal, round, and reactive to light.  Cardiovascular:     Rate and Rhythm: Normal rate and regular rhythm.     Pulses: Normal pulses.     Heart sounds: Normal heart sounds.  Pulmonary:     Effort: Pulmonary effort is normal.     Breath sounds: Normal breath sounds.  Chest:     Comments: Patient declined.  Abdominal:     General: Bowel sounds are normal.     Palpations: Abdomen is soft.  Genitourinary:    Comments: Patient declined.  Musculoskeletal:        General: Normal range of  motion.     Right shoulder: Normal.     Left shoulder: Normal.     Right upper arm: Normal.     Left upper arm: Normal.     Right elbow: Normal.     Left elbow: Normal.     Right forearm: Normal.     Left forearm: Normal.     Right wrist: Normal.     Left wrist: Normal.     Right hand: Normal.     Left hand: Normal.     Cervical back: Normal, normal range of motion and neck supple.     Thoracic back: Normal.     Lumbar back: Normal.     Right hip: Normal.     Left hip: Normal.     Right upper leg: Normal.     Left upper leg: Normal.     Right knee: Normal.     Left knee: Normal.     Right lower leg: Normal.     Left lower leg: Normal.     Right ankle: Normal.     Left ankle: Normal.     Right foot: Normal.     Left foot: Normal.  Skin:    General: Skin is warm and dry.     Capillary Refill: Capillary refill takes less than 2 seconds.  Neurological:     General: No focal deficit present.     Mental Status: She is alert and oriented to person, place, and time.  Psychiatric:        Mood and Affect: Mood normal.        Behavior: Behavior normal.    Diabetic Foot Exam - Simple   Simple Foot Form Visual Inspection No deformities, no ulcerations, no other skin breakdown bilaterally: Yes Sensation Testing Intact to touch and monofilament testing bilaterally: Yes Pulse Check Posterior Tibialis and Dorsalis pulse intact bilaterally: Yes Comments     ASSESSMENT AND PLAN: 1. Annual physical exam - Counseled on 150 minutes of exercise per week as tolerated, healthy eating (including decreased daily intake of saturated fats, cholesterol, added sugars,  sodium), STI prevention, and routine healthcare maintenance.  2. Screening for metabolic disorder - Routine screening.  - CMP14+EGFR  3. Screening for deficiency anemia - Routine screening.  - CBC  4. Thyroid disorder screen - Routine screening.  - TSH  5. Encounter for screening mammogram for malignant neoplasm of  breast - Routine screening.  - MM Digital Screening; Future  6. Pap smear for cervical cancer screening 7. Routine screening for STI (sexually transmitted infection) - Referral to Gastroenterology for further evaluation/management. - Ambulatory referral to Gynecology  8. Colon cancer screening - Routine screening.  - Ambulatory referral to Gastroenterology  9. Primary hypertension - Continue Amlodipine as prescribed. - Counseled on blood pressure goal of less than 130/80, low-sodium, DASH diet, medication compliance, and 150 minutes of moderate intensity exercise per week as tolerated. Counseled on medication adherence and adverse effects. - Follow-up with primary provider in 3 months or sooner if needed.  - amLODipine (NORVASC) 5 MG tablet; Take 1 tablet (5 mg total) by mouth daily.  Dispense: 90 tablet; Refill: 0  10. Type 2 diabetes mellitus with hyperglycemia, without long-term current use of insulin (HCC) - Continue Metformin as prescribed. - Routine screening.  - Discussed the importance of healthy eating habits, low-carbohydrate diet, low-sugar diet, regular aerobic exercise (at least 150 minutes a week as tolerated) and medication compliance to achieve or maintain control of diabetes. - Follow-up with primary provider in 3 months or sooner if needed.  - Hemoglobin A1c - metFORMIN (GLUCOPHAGE-XR) 500 MG 24 hr tablet; Take 1 tablet (500 mg total) by mouth 2 (two) times daily with a meal.  Dispense: 180 tablet; Refill: 0  11. Diabetic eye exam [facility]) - Referral to Ophthalmology for further evaluation/management.  - Ambulatory referral to Ophthalmology  12. Encounter for diabetic foot exam (HCC) - Completed today in office.  13. Hyperlipidemia, unspecified hyperlipidemia type - Continue Atorvastatin as prescribed. - Follow-up with primary provider in 3 months or sooner if needed.  - atorvastatin (LIPITOR) 20 MG tablet; Take 1 tablet (20 mg total) by mouth daily.  Dispense: 90  tablet; Refill: 0  14. Anxiety and depression - Patient denies thoughts of self-harm, suicidal ideations, homicidal ideations. - Increase Citalopram from 10 mg daily to 20 mg daily.  - Continue Hydroxyzine as prescribed.  - Referral to Psychiatry for further evaluation/management. During the interim follow-up with primary provider in 4 weeks or sooner if needed until established with referral. - Ambulatory referral to Psychiatry - citalopram (CELEXA) 20 MG tablet; Take 1 tablet (20 mg total) by mouth daily.  Dispense: 30 tablet; Refill: 2 - hydrOXYzine (VISTARIL) 25 MG capsule; Take 1 capsule (25 mg total) by mouth every 8 (eight) hours as needed.  Dispense: 30 capsule; Refill: 2  15. Gastroesophageal reflux disease, unspecified whether esophagitis present - Continue Omeprazole as prescribed. - Follow-up with primary provider in 3 months or sooner if needed.  - omeprazole (PRILOSEC) 40 MG capsule; Take 1 capsule (40 mg total) by mouth daily.  Dispense: 90 capsule; Refill: 0  16. Migraine without status migrainosus, not intractable, unspecified migraine type - Sumatriptan as prescribed. Counseled on medication adherence/adverse effects.  - Follow-up with primary provider in 4 weeks or sooner if needed.  - SUMAtriptan (IMITREX) 25 MG tablet; Take 25 mg (1 tablet total) by mouth at the start of the headache. May repeat in 2 hours x 1 if headache persists. Max of 2 tablets/24 hours.  Dispense: 30 tablet; Refill: 1    Patient was given  the opportunity to ask questions.  Patient verbalized understanding of the plan and was able to repeat key elements of the plan. Patient was given clear instructions to go to Emergency Department or return to medical center if symptoms don't improve, worsen, or new problems develop.The patient verbalized understanding.   Orders Placed This Encounter  Procedures   MM Digital Screening   CBC   TSH   CMP14+EGFR   Hemoglobin A1c   Ambulatory referral to  Gynecology   Ambulatory referral to Gastroenterology   Ambulatory referral to Ophthalmology   Ambulatory referral to Psychiatry     Requested Prescriptions   Signed Prescriptions Disp Refills   amLODipine (NORVASC) 5 MG tablet 90 tablet 0    Sig: Take 1 tablet (5 mg total) by mouth daily.   atorvastatin (LIPITOR) 20 MG tablet 90 tablet 0    Sig: Take 1 tablet (20 mg total) by mouth daily.   citalopram (CELEXA) 20 MG tablet 30 tablet 2    Sig: Take 1 tablet (20 mg total) by mouth daily.   metFORMIN (GLUCOPHAGE-XR) 500 MG 24 hr tablet 180 tablet 0    Sig: Take 1 tablet (500 mg total) by mouth 2 (two) times daily with a meal.   omeprazole (PRILOSEC) 40 MG capsule 90 capsule 0    Sig: Take 1 capsule (40 mg total) by mouth daily.   hydrOXYzine (VISTARIL) 25 MG capsule 30 capsule 2    Sig: Take 1 capsule (25 mg total) by mouth every 8 (eight) hours as needed.   SUMAtriptan (IMITREX) 25 MG tablet 30 tablet 1    Sig: Take 25 mg (1 tablet total) by mouth at the start of the headache. May repeat in 2 hours x 1 if headache persists. Max of 2 tablets/24 hours.    Return in about 1 year (around 05/24/2024) for Physical per patient preference and 3 months chronic care mgmt .  Rema Fendt, NP

## 2023-05-25 NOTE — Progress Notes (Signed)
Pt wants to talk about the headaches returning.   Pt need refill on tramadol

## 2023-05-25 NOTE — Patient Instructions (Signed)
Preventive Care 54-54 Years Old, Female Preventive care refers to lifestyle choices and visits with your health care provider that can promote health and wellness. Preventive care visits are also called wellness exams. What can I expect for my preventive care visit? Counseling Your health care provider may ask you questions about your: Medical history, including: Past medical problems. Family medical history. Pregnancy history. Current health, including: Menstrual cycle. Method of birth control. Emotional well-being. Home life and relationship well-being. Sexual activity and sexual health. Lifestyle, including: Alcohol, nicotine or tobacco, and drug use. Access to firearms. Diet, exercise, and sleep habits. Work and work environment. Sunscreen use. Safety issues such as seatbelt and bike helmet use. Physical exam Your health care provider will check your: Height and weight. These may be used to calculate your BMI (body mass index). BMI is a measurement that tells if you are at a healthy weight. Waist circumference. This measures the distance around your waistline. This measurement also tells if you are at a healthy weight and may help predict your risk of certain diseases, such as type 2 diabetes and high blood pressure. Heart rate and blood pressure. Body temperature. Skin for abnormal spots. What immunizations do I need?  Vaccines are usually given at various ages, according to a schedule. Your health care provider will recommend vaccines for you based on your age, medical history, and lifestyle or other factors, such as travel or where you work. What tests do I need? Screening Your health care provider may recommend screening tests for certain conditions. This may include: Lipid and cholesterol levels. Diabetes screening. This is done by checking your blood sugar (glucose) after you have not eaten for a while (fasting). Pelvic exam and Pap test. Hepatitis B test. Hepatitis C  test. HIV (human immunodeficiency virus) test. STI (sexually transmitted infection) testing, if you are at risk. Lung cancer screening. Colorectal cancer screening. Mammogram. Talk with your health care provider about when you should start having regular mammograms. This may depend on whether you have a family history of breast cancer. BRCA-related cancer screening. This may be done if you have a family history of breast, ovarian, tubal, or peritoneal cancers. Bone density scan. This is done to screen for osteoporosis. Talk with your health care provider about your test results, treatment options, and if necessary, the need for more tests. Follow these instructions at home: Eating and drinking  Eat a diet that includes fresh fruits and vegetables, whole grains, lean protein, and low-fat dairy products. Take vitamin and mineral supplements as recommended by your health care provider. Do not drink alcohol if: Your health care provider tells you not to drink. You are pregnant, may be pregnant, or are planning to become pregnant. If you drink alcohol: Limit how much you have to 0-1 drink a day. Know how much alcohol is in your drink. In the [place] one drink equals one 12 oz bottle of beer (355 mL), one 5 oz glass of wine (148 mL), or one 1 oz glass of hard liquor (44 mL). Lifestyle Brush your teeth every morning and night with fluoride toothpaste. Floss one time each day. Exercise for at least 30 minutes 5 or more days each week. Do not use any products that contain nicotine or tobacco. These products include cigarettes, chewing tobacco, and vaping devices, such as e-cigarettes. If you need help quitting, ask your health care provider. Do not use drugs. If you are sexually active, practice safe sex. Use a condom or other form of protection to   prevent STIs. If you do not wish to become pregnant, use a form of birth control. If you plan to become pregnant, see your health care provider for a  prepregnancy visit. Take aspirin only as told by your health care provider. Make sure that you understand how much to take and what form to take. Work with your health care provider to find out whether it is safe and beneficial for you to take aspirin daily. Find healthy ways to manage stress, such as: Meditation, yoga, or listening to music. Journaling. Talking to a trusted person. Spending time with friends and family. Minimize exposure to UV radiation to reduce your risk of skin cancer. Safety Always wear your seat belt while driving or riding in a vehicle. Do not drive: If you have been drinking alcohol. Do not ride with someone who has been drinking. When you are tired or distracted. While texting. If you have been using any mind-altering substances or drugs. Wear a helmet and other protective equipment during sports activities. If you have firearms in your house, make sure you follow all gun safety procedures. Seek help if you have been physically or sexually abused. What's next? Visit your health care provider once a year for an annual wellness visit. Ask your health care provider how often you should have your eyes and teeth checked. Stay up to date on all vaccines. This information is not intended to replace advice given to you by your health care provider. Make sure you discuss any questions you have with your health care provider. Document Revised: 04/22/2021 Document Reviewed: 04/22/2021 Elsevier Patient Education  2024 Elsevier Inc.  

## 2023-05-26 LAB — CBC
Hematocrit: 37.4 % (ref 34.0–46.6)
Hemoglobin: 12.4 g/dL (ref 11.1–15.9)
MCH: 26.4 pg — ABNORMAL LOW (ref 26.6–33.0)
MCHC: 33.2 g/dL (ref 31.5–35.7)
MCV: 80 fL (ref 79–97)
Platelets: 240 10*3/uL (ref 150–450)
RBC: 4.7 x10E6/uL (ref 3.77–5.28)
RDW: 16 % — ABNORMAL HIGH (ref 11.7–15.4)
WBC: 6.9 10*3/uL (ref 3.4–10.8)

## 2023-05-26 LAB — CMP14+EGFR
ALT: 12 IU/L (ref 0–32)
AST: 15 IU/L (ref 0–40)
Albumin: 4.1 g/dL (ref 3.8–4.9)
Alkaline Phosphatase: 85 IU/L (ref 44–121)
BUN/Creatinine Ratio: 19 (ref 9–23)
BUN: 16 mg/dL (ref 6–24)
Bilirubin Total: <0.2 mg/dL (ref 0.0–1.2)
CO2: 22 mmol/L (ref 20–29)
Calcium: 9.3 mg/dL (ref 8.7–10.2)
Chloride: 104 mmol/L (ref 96–106)
Creatinine, Ser: 0.85 mg/dL (ref 0.57–1.00)
Globulin, Total: 2.4 g/dL (ref 1.5–4.5)
Glucose: 130 mg/dL — ABNORMAL HIGH (ref 70–99)
Potassium: 3.7 mmol/L (ref 3.5–5.2)
Sodium: 141 mmol/L (ref 134–144)
Total Protein: 6.5 g/dL (ref 6.0–8.5)
eGFR: 82 mL/min/{1.73_m2} (ref >59)

## 2023-05-26 LAB — HEMOGLOBIN A1C
Est. average glucose Bld gHb Est-mCnc: 143 mg/dL
Hgb A1c MFr Bld: 6.6 % — ABNORMAL HIGH (ref 4.8–5.6)

## 2023-05-26 LAB — TSH: TSH: 1.43 u[IU]/mL (ref 0.450–4.500)

## 2023-05-27 ENCOUNTER — Other Ambulatory Visit: Payer: Self-pay | Admitting: Physical Medicine and Rehabilitation

## 2023-05-30 ENCOUNTER — Encounter: Payer: Self-pay | Admitting: Family

## 2023-05-30 ENCOUNTER — Telehealth: Payer: Self-pay | Admitting: Licensed Clinical Social Worker

## 2023-05-30 ENCOUNTER — Ambulatory Visit (INDEPENDENT_AMBULATORY_CARE_PROVIDER_SITE_OTHER): Payer: Self-pay | Admitting: Physical Medicine and Rehabilitation

## 2023-05-30 ENCOUNTER — Telehealth: Payer: Self-pay | Admitting: Family

## 2023-05-30 ENCOUNTER — Other Ambulatory Visit: Payer: Self-pay

## 2023-05-30 VITALS — BP 159/88 | HR 90

## 2023-05-30 DIAGNOSIS — M5412 Radiculopathy, cervical region: Secondary | ICD-10-CM | POA: Diagnosis not present

## 2023-05-30 MED ORDER — METHYLPREDNISOLONE ACETATE 80 MG/ML IJ SUSP
80.0000 mg | Freq: Once | INTRAMUSCULAR | Status: AC
  Administered 2023-05-30: 80 mg

## 2023-05-30 NOTE — Progress Notes (Signed)
Functional Pain Scale - descriptive words and definitions  Distracting (5)    Aware of pain/able to complete some ADL's but limited by pain/sleep is affected and active distractions are only slightly useful. Moderate range order  Average Pain 4   +Driver, -BT, -Dye Allergies.  Neck pain with radiation in left arm

## 2023-05-30 NOTE — Telephone Encounter (Signed)
Pt called to schedule an appointment with [facility] and to be connected with a therapist.

## 2023-05-30 NOTE — Telephone Encounter (Signed)
Hey No I do not, I'm not familiar with this Dr. I did call pt today, she didn't answer. I left a message

## 2023-05-30 NOTE — Telephone Encounter (Signed)
Called pt to schedule appt. Pt didn't answer. [facility] left a messge for pt.

## 2023-05-30 NOTE — Telephone Encounter (Signed)
Pt. Given lab results, verbalizes understanding. 

## 2023-05-30 NOTE — Patient Instructions (Signed)

## 2023-05-31 ENCOUNTER — Other Ambulatory Visit: Payer: Self-pay | Admitting: Family

## 2023-05-31 DIAGNOSIS — R059 Cough, unspecified: Secondary | ICD-10-CM

## 2023-05-31 MED ORDER — AMOXICILLIN-POT CLAVULANATE 875-125 MG PO TABS
1.0000 | ORAL_TABLET | Freq: Two times a day (BID) | ORAL | 0 refills | Status: DC
Start: 2023-05-31 — End: 2023-07-06

## 2023-05-31 MED ORDER — PROMETHAZINE-DM 6.25-15 MG/5ML PO SYRP
5.0000 mL | ORAL_SOLUTION | Freq: Four times a day (QID) | ORAL | 0 refills | Status: DC | PRN
Start: 2023-05-31 — End: 2023-07-06

## 2023-05-31 NOTE — Telephone Encounter (Signed)
-   Augmentin prescribed.  - Promethazine DM refilled.

## 2023-06-02 ENCOUNTER — Encounter: Payer: Self-pay | Admitting: Licensed Clinical Social Worker

## 2023-06-08 NOTE — Progress Notes (Signed)
VEOLA HEGLAND - 52 y.o. female MRN [id number]  Date of birth: Dec 30, 1970  Office Visit Note: Visit Date: 05/30/2023 PCP: Rema Fendt, NP Referred by: Rema Fendt, NP  Subjective: Chief Complaint  Patient presents with   Neck - Pain   HPI:  REBAKAH WILMER is a 52 y.o. female who comes in today at the request of Ellin Goodie, FNP and Dr. Marrianne Mood Dean for planned Left C7-T1 Cervical Interlaminar epidural steroid injection with fluoroscopic guidance.  The patient has failed conservative care including home exercise, medications, time and activity modification.  This injection will be diagnostic and hopefully therapeutic.  Please see requesting physician notes for further details and justification.   ROS Otherwise per HPI.  Assessment & Plan: Visit Diagnoses:    ICD-10-CM   1. Cervical radiculopathy  M54.12 XR C-ARM NO REPORT    Epidural Steroid injection    methylPREDNISolone acetate (DEPO-MEDROL) injection 80 mg      Plan: No additional findings.   Meds & Orders:  Meds ordered this encounter  Medications   methylPREDNISolone acetate (DEPO-MEDROL) injection 80 mg    Orders Placed This Encounter  Procedures   XR C-ARM NO REPORT   Epidural Steroid injection    Follow-up: Return for visit to requesting provider as needed.   Procedures: No procedures performed  Cervical Epidural Steroid Injection - Interlaminar Approach with Fluoroscopic Guidance  Patient: [name]      Date of Birth: August 06, 1971 MRN: [id number] PCP: Rema Fendt, NP      Visit Date: 05/30/2023   Universal Protocol:    Date/Time: 07/31/245:51 AM  Consent Given By: the patient  Position: PRONE  Additional Comments: Vital signs were monitored before and after the procedure. Patient was prepped and draped in the usual sterile fashion. The correct patient, procedure, and site was verified.   Injection Procedure Details:   Procedure diagnoses: Cervical radiculopathy [M54.12]     Meds Administered:  Meds ordered this encounter  Medications   methylPREDNISolone acetate (DEPO-MEDROL) injection 80 mg     Laterality: Left  Location/Site: C7-T1  Needle: 3.5 in., 20 ga. Tuohy  Needle Placement: Paramedian epidural space  Findings:  -Comments: Excellent flow of contrast into the epidural space.  Procedure Details: Using a paramedian approach from the side mentioned above, the region overlying the inferior lamina was localized under fluoroscopic visualization and the soft tissues overlying this structure were infiltrated with 4 ml. of 1% Lidocaine without Epinephrine. A # 20 gauge, Tuohy needle was inserted into the epidural space using a paramedian approach.  The epidural space was localized using loss of resistance along with contralateral oblique bi-planar fluoroscopic views.  After negative aspirate for air, blood, and CSF, a 2 ml. volume of Isovue-250 was injected into the epidural space and the flow of contrast was observed. Radiographs were obtained for documentation purposes.   The injectate was administered into the level noted above.  Additional Comments:  No complications occurred Dressing: 2 x 2 sterile gauze and Band-Aid    Post-procedure details: Patient was observed during the procedure. Post-procedure instructions were reviewed.  Patient left the clinic in stable condition.   Clinical History: MRI CERVICAL SPINE WITHOUT CONTRAST   TECHNIQUE: Multiplanar, multisequence MR imaging of the cervical spine was performed. No intravenous contrast was administered.   COMPARISON:  Cervical spine MRI 01/18/2014. Cervical spine radiographs 03/15/2022.   FINDINGS: Alignment: Chronic straightening of cervical lordosis, less reversal of lordosis compared to the preoperative MRI in  2015.   Vertebrae: ACDF hardware artifact now C4-C5 and C5-C6. Mild degenerative endplate marrow edema eccentric to the left at the adjacent C6-C7 segment (series 4,  image 11). Background bone marrow signal within normal limits. No other No acute osseous abnormality identified.   Cord: No spinal cord signal abnormality despite degenerative cord mass effect detailed below. Negative visible upper thoracic canal and cord.   Posterior Fossa, vertebral arteries, paraspinal tissues: Cervicomedullary junction is within normal limits. Partially empty sella is chronic. Negative visible posterior fossa. Preserved major vascular flow voids in the neck. Right vertebral artery appears dominant as in 2015. Negative visible neck soft tissues and lung apices.   Disc levels:   C2-C3: Chronic disc bulging and endplate spurring eccentric to the right with moderate right side facet and ligament flavum hypertrophy. Borderline spinal stenosis now at this level. Moderate to severe right C3 foraminal stenosis has also progressed.   C3-C4: Progressed disc space loss with circumferential disc bulge, anterior endplate spurring, and lobulated posterior and slightly cephalad extrusion of disc (greater on the right series 7, image 7). Mild to moderate facet and ligament flavum hypertrophy has increased on the right. New mild spinal stenosis and mild ventral cord mass effect (same image). Mild to moderate bilateral C4 foraminal stenosis appears increased on the right.   C4-C5:  Interval ACDF. Resolved stenosis.   C5-C6: Interval ACDF. No stenosis. Residual endplate spurring on the left.   C6-C7: Interval disc space loss. Circumferential disc bulge and broad-based posterior component of disc (series 7, image 21). New mild spinal stenosis and ventral cord mass effect. Mild facet and ligament flavum hypertrophy. Mild to moderate bilateral C7 foraminal stenosis is new.   C7-T1:  Stable mild facet hypertrophy. No stenosis.   IMPRESSION: 1. ACDF C4-C5 and C5-C6 since a 2015 MRI with resolved stenosis at those levels.   2. But progressed degeneration at both adjacent  segments: C3-C4 and C6-C7. New mild spinal stenosis AND spinal cord mass effect at both levels. No cord signal abnormality. Up to moderate bilateral foraminal stenosis at both levels.   3. Progressed C2-C3 degeneration with new borderline spinal stenosis and increased moderate to severe right C3 foraminal stenosis.     Electronically Signed   By: Odessa Fleming M.D.   On: 03/30/2022 15:42     Objective:  VS:  HT:    WT:   BMI:     BP:(!) 159/88  HR:90bpm  TEMP: ( )  RESP:  Physical Exam Vitals and nursing note reviewed.  Constitutional:      General: She is not in acute distress.    Appearance: Normal appearance. She is not ill-appearing.  HENT:     Head: Normocephalic and atraumatic.     Right Ear: External ear normal.     Left Ear: External ear normal.  Eyes:     Extraocular Movements: Extraocular movements intact.  Cardiovascular:     Rate and Rhythm: Normal rate.     Pulses: Normal pulses.  Musculoskeletal:     Cervical back: Tenderness present. No rigidity.     Right lower leg: No edema.     Left lower leg: No edema.     Comments: Patient has good strength in the upper extremities including 5 out of 5 strength in wrist extension long finger flexion and APB.  There is no atrophy of the hands intrinsically.  There is a negative Hoffmann's test.   Lymphadenopathy:     Cervical: No cervical adenopathy.  Skin:  Findings: No erythema, lesion or rash.  Neurological:     General: No focal deficit present.     Mental Status: She is alert and oriented to person, place, and time.     Sensory: No sensory deficit.     Motor: No weakness or abnormal muscle tone.     Coordination: Coordination normal.  Psychiatric:        Mood and Affect: Mood normal.        Behavior: Behavior normal.      Imaging: No results found.

## 2023-06-08 NOTE — Procedures (Signed)
Cervical Epidural Steroid Injection - Interlaminar Approach with Fluoroscopic Guidance  Patient: [name]      Date of Birth: 1971-01-08 MRN: [id number] PCP: Rema Fendt, NP      Visit Date: 05/30/2023   Universal Protocol:    Date/Time: 07/31/245:51 AM  Consent Given By: the patient  Position: PRONE  Additional Comments: Vital signs were monitored before and after the procedure. Patient was prepped and draped in the usual sterile fashion. The correct patient, procedure, and site was verified.   Injection Procedure Details:   Procedure diagnoses: Cervical radiculopathy [M54.12]    Meds Administered:  Meds ordered this encounter  Medications   methylPREDNISolone acetate (DEPO-MEDROL) injection 80 mg     Laterality: Left  Location/Site: C7-T1  Needle: 3.5 in., 20 ga. Tuohy  Needle Placement: Paramedian epidural space  Findings:  -Comments: Excellent flow of contrast into the epidural space.  Procedure Details: Using a paramedian approach from the side mentioned above, the region overlying the inferior lamina was localized under fluoroscopic visualization and the soft tissues overlying this structure were infiltrated with 4 ml. of 1% Lidocaine without Epinephrine. A # 20 gauge, Tuohy needle was inserted into the epidural space using a paramedian approach.  The epidural space was localized using loss of resistance along with contralateral oblique bi-planar fluoroscopic views.  After negative aspirate for air, blood, and CSF, a 2 ml. volume of Isovue-250 was injected into the epidural space and the flow of contrast was observed. Radiographs were obtained for documentation purposes.   The injectate was administered into the level noted above.  Additional Comments:  No complications occurred Dressing: 2 x 2 sterile gauze and Band-Aid    Post-procedure details: Patient was observed during the procedure. Post-procedure instructions were reviewed.  Patient left  the clinic in stable condition.

## 2023-06-22 DIAGNOSIS — E785 Hyperlipidemia, unspecified: Secondary | ICD-10-CM | POA: Diagnosis not present

## 2023-06-22 DIAGNOSIS — F419 Anxiety disorder, unspecified: Secondary | ICD-10-CM | POA: Diagnosis not present

## 2023-06-22 DIAGNOSIS — G43909 Migraine, unspecified, not intractable, without status migrainosus: Secondary | ICD-10-CM | POA: Diagnosis not present

## 2023-06-22 DIAGNOSIS — Z6834 Body mass index (BMI) 34.0-34.9, adult: Secondary | ICD-10-CM | POA: Diagnosis not present

## 2023-06-22 DIAGNOSIS — E119 Type 2 diabetes mellitus without complications: Secondary | ICD-10-CM | POA: Diagnosis not present

## 2023-06-22 DIAGNOSIS — E669 Obesity, unspecified: Secondary | ICD-10-CM | POA: Diagnosis not present

## 2023-06-22 DIAGNOSIS — Z87891 Personal history of nicotine dependence: Secondary | ICD-10-CM | POA: Diagnosis not present

## 2023-06-22 DIAGNOSIS — Z7984 Long term (current) use of oral hypoglycemic drugs: Secondary | ICD-10-CM | POA: Diagnosis not present

## 2023-06-22 DIAGNOSIS — I10 Essential (primary) hypertension: Secondary | ICD-10-CM | POA: Diagnosis not present

## 2023-06-22 DIAGNOSIS — Z91040 Latex allergy status: Secondary | ICD-10-CM | POA: Diagnosis not present

## 2023-06-22 DIAGNOSIS — Z833 Family history of diabetes mellitus: Secondary | ICD-10-CM | POA: Diagnosis not present

## 2023-06-22 DIAGNOSIS — K219 Gastro-esophageal reflux disease without esophagitis: Secondary | ICD-10-CM | POA: Diagnosis not present

## 2023-06-29 ENCOUNTER — Ambulatory Visit: Payer: 59

## 2023-07-06 ENCOUNTER — Other Ambulatory Visit: Payer: Self-pay | Admitting: Family

## 2023-07-06 ENCOUNTER — Ambulatory Visit (INDEPENDENT_AMBULATORY_CARE_PROVIDER_SITE_OTHER): Payer: 59

## 2023-07-06 ENCOUNTER — Ambulatory Visit
Admission: EM | Admit: 2023-07-06 | Discharge: 2023-07-06 | Disposition: A | Discharge status: Home / Self Care | Payer: 59 | Attending: Internal Medicine | Admitting: Internal Medicine

## 2023-07-06 ENCOUNTER — Encounter: Payer: Self-pay | Admitting: *Deleted

## 2023-07-06 ENCOUNTER — Other Ambulatory Visit: Payer: Self-pay

## 2023-07-06 DIAGNOSIS — J069 Acute upper respiratory infection, unspecified: Secondary | ICD-10-CM

## 2023-07-06 DIAGNOSIS — R059 Cough, unspecified: Secondary | ICD-10-CM | POA: Diagnosis not present

## 2023-07-06 DIAGNOSIS — R053 Chronic cough: Secondary | ICD-10-CM

## 2023-07-06 DIAGNOSIS — R918 Other nonspecific abnormal finding of lung field: Secondary | ICD-10-CM | POA: Diagnosis not present

## 2023-07-06 MED ORDER — PROMETHAZINE-DM 6.25-15 MG/5ML PO SYRP
5.0000 mL | ORAL_SOLUTION | Freq: Four times a day (QID) | ORAL | 0 refills | Status: DC | PRN
Start: 2023-07-06 — End: 2023-09-21

## 2023-07-06 MED ORDER — PREDNISONE 20 MG PO TABS: 40.0000 mg | ORAL_TABLET | Freq: Every day | ORAL | 0 refills | Status: AC

## 2023-07-06 MED ORDER — DOXYCYCLINE HYCLATE 100 MG PO CAPS: 100.0000 mg | ORAL_CAPSULE | Freq: Two times a day (BID) | ORAL | 0 refills | Status: AC

## 2023-07-06 MED ORDER — METHYLPREDNISOLONE ACETATE 80 MG/ML IJ SUSP
80.0000 mg | Freq: Once | INTRAMUSCULAR | Status: AC
  Administered 2023-07-06: 80 mg via INTRAMUSCULAR

## 2023-07-06 MED ORDER — FLUCONAZOLE 150 MG PO TABS: 150.0000 mg | ORAL_TABLET | Freq: Every day | ORAL | 0 refills | Status: DC

## 2023-07-06 NOTE — ED Provider Notes (Signed)
[facility]    CSN: [id number] Arrival date & time: 07/06/23  1600      History   Chief Complaint Chief Complaint  Patient presents with   Cough    HPI CARRIANN Rowland is a 52 y.o. female.   Patient presents with right-sided runny nose and dry cough that has been present for 2 weeks.  Patient reports that she has a history of bronchitis and uses Spiriva and albuterol inhaler.  She has been using inhalers with no improvement.  Denies shortness of breath but reports a "tightness" in her upper airway.  Denies any fever.  Reports this is the first time that she has been evaluated for this but was taking some leftover Augmentin for a few days with improvement.   Cough   Past Medical History:  Diagnosis Date   Anemia    Anxiety    Arthritis    right knee, back   COPD (chronic obstructive pulmonary disease) (HCC)    GERD (gastroesophageal reflux disease)    Headache(784.0)    History of blood transfusion    Hx; of in 1991 after delivery   History of TIAs    Hypertension    Numbness    Right hand   Pneumonia    Stroke (HCC) 05/2013    Patient Active Problem List   Diagnosis Date Noted   Adjustment disorder with mixed anxiety and depressed mood 04/19/2023   Hyperlipidemia 11/16/2022   Diabetes mellitus, type 2 (HCC) 11/16/2022   Trochanteric bursitis, left hip 09/03/2022   Asthmatic bronchitis with acute exacerbation 04/09/2022   Chronic cough 01/27/2022   Right thyroid nodule 01/07/2022   Essential hypertension 01/05/2022   Migraine 01/05/2022   Thyroid nodule greater than or equal to 1.5 cm in diameter incidentally noted on imaging study 12/01/2021   Grief 02/06/2021   Non-intractable vomiting 02/06/2021   Arthritis of right knee    S/P knee replacement 01/06/2021   Morning headache 06/02/2017   Leiomyoma of body of uterus 12/24/2016   Prediabetes 11/11/2016   Pap smear abnormality of cervix with ASCUS favoring benign 01/01/2016   COPD (chronic  obstructive pulmonary disease) (HCC) 11/18/2015   GERD (gastroesophageal reflux disease) 11/18/2015   Abnormal imaging of thyroid 11/04/2015   COPD exacerbation (HCC) 11/01/2015   Elevated d-dimer 11/01/2015   IDA (iron deficiency anemia) 09/04/2015   Patellofemoral disorder 04/04/2015   Insomnia 04/09/2014   S/P cervical spinal fusion 03/06/2014   Class 2 severe obesity due to excess calories with serious comorbidity and body mass index (BMI) of 37.0 to 37.9 in adult (HCC) 02/22/2014   Radicular pain in right arm 01/11/2014   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   Other and unspecified hyperlipidemia 08/24/2013   History of CVA (cerebrovascular accident) 05/18/2013   Tobacco abuse, 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 NEURO ORS;  Service: Neurosurgery;  Laterality: N/A;   CESAREAN SECTION     x 1 with 5th pregnancy   CHOLECYSTECTOMY     KNEE ARTHROSCOPY Right 2015   TEE WITHOUT CARDIOVERSION N/A 05/21/2013   Procedure: TRANSESOPHAGEAL ECHOCARDIOGRAM (TEE);  Surgeon: Quintella Reichert, MD;  Location: [facility] ENDOSCOPY;  Service: Cardiovascular;  Laterality: N/A;   TOTAL KNEE ARTHROPLASTY Right 01/06/2021   TOTAL KNEE ARTHROPLASTY Right 01/06/2021  Procedure: RIGHT TOTAL KNEE ARTHROPLASTY;  Surgeon: Cammy Copa, MD;  Location: [facility] OR;  Service: Orthopedics;  Laterality: Right;   TUBAL LIGATION      OB History   No obstetric history on file.      Home Medications    Prior to Admission medications   Medication Sig Start Date End Date Taking? Authorizing Provider  amLODipine (NORVASC) 5 MG tablet Take 1 tablet (5 mg total) by mouth daily. 05/25/23 08/23/23 Yes Zonia Kief, Amy J, NP  atorvastatin (LIPITOR) 20 MG tablet Take 1 tablet (20 mg total)  by mouth daily. 05/25/23  Yes Zonia Kief, Amy J, NP  citalopram (CELEXA) 20 MG tablet Take 1 tablet (20 mg total) by mouth daily. 05/25/23 08/23/23 Yes Zonia Kief, Amy J, NP  doxycycline (VIBRAMYCIN) 100 MG capsule Take 1 capsule (100 mg total) by mouth 2 (two) times daily for 7 days. 07/06/23 07/13/23 Yes Lorey Pallett, Acie Fredrickson, FNP  ferrous sulfate 325 (65 FE) MG tablet Take 1 tablet (325 mg total) by mouth every other day. 01/05/22  Yes Mayers, Cari S, PA-C  fluconazole (DIFLUCAN) 150 MG tablet Take 1 tablet (150 mg total) by mouth daily. Take at first sign of vaginal yeast 07/06/23  Yes Gustavus Bryant, FNP  hydrOXYzine (VISTARIL) 25 MG capsule Take 1 capsule (25 mg total) by mouth every 8 (eight) hours as needed. 05/25/23  Yes Zonia Kief, Amy J, NP  metFORMIN (GLUCOPHAGE-XR) 500 MG 24 hr tablet Take 1 tablet (500 mg total) by mouth 2 (two) times daily with a meal. 05/25/23  Yes Zonia Kief, Amy J, NP  omeprazole (PRILOSEC) 40 MG capsule Take 1 capsule (40 mg total) by mouth daily. 05/25/23 08/23/23 Yes Zonia Kief, Amy J, NP  predniSONE (DELTASONE) 20 MG tablet Take 2 tablets (40 mg total) by mouth daily for 5 days. 07/06/23 07/11/23 Yes Roxanna Mcever, Acie Fredrickson, FNP  Tiotropium Bromide Monohydrate (SPIRIVA RESPIMAT) 2.5 MCG/ACT AERS Inhale 2 puffs into the lungs daily. 03/15/22  Yes Luciano Cutter, MD  albuterol (PROVENTIL) (2.5 MG/3ML) 0.083% nebulizer solution Take 3 mLs by nebulization every 6 (six) hours as needed for wheezing or shortness of breath. 03/15/23   Luciano Cutter, MD  albuterol (VENTOLIN HFA) 108 (90 Base) MCG/ACT inhaler Inhale 2 puffs into the lungs every 6 (six) hours as needed for wheezing or shortness of breath. 09/24/22   Luciano Cutter, MD  fluticasone-salmeterol (ADVAIR DISKUS) 250-50 MCG/ACT AEPB Inhale 1 puff into the lungs in the morning and at bedtime. 09/24/22   Luciano Cutter, MD  gabapentin (NEURONTIN) 100 MG capsule Take 1 capsule (100 mg total) by mouth 4 (four) times daily. Patient not taking:  Reported on 05/25/2023 06/03/22   Rema Fendt, NP  methocarbamol (ROBAXIN) 500 MG tablet Take 1 tablet (500 mg total) by mouth 4 (four) times daily. Patient not taking: Reported on 05/25/2023 11/29/22   Cammy Copa, MD  promethazine-dextromethorphan (PROMETHAZINE-DM) 6.25-15 MG/5ML syrup Take 5 mLs by mouth 4 (four) times daily as needed for cough. 07/06/23   Rema Fendt, NP  SUMAtriptan (IMITREX) 25 MG tablet Take 25 mg (1 tablet total) by mouth at the start of the headache. May repeat in 2 hours x 1 if headache persists. Max of 2 tablets/24 hours. 05/25/23   Rema Fendt, NP  traMADol (ULTRAM) 50 MG tablet Take 1 tablet (50 mg total) by mouth every 8 (eight) hours as needed for moderate pain or severe pain. 01/31/23   Juanda Chance, NP  Family History Family History  Problem Relation Age of Onset   Renal Disease Father        dialysis   Hypertension Father    Heart disease Mother    Heart disease Maternal Grandfather    Asthma Sister    Asthma Maternal Aunt     Social History Social History   Tobacco Use   Smoking status: Former    Current packs/day: 0.00    Average packs/day: 0.5 packs/day for 26.0 years (13.0 ttl pk-yrs)    Types: Cigarettes    Start date: 10/08/1989    Quit date: 10/09/2015    Years since quitting: 7.7    Passive exposure: Past   Smokeless tobacco: Former  Building services engineer status: Never Used  Substance Use Topics   Alcohol use: Yes    Comment: occasionally   Drug use: No     Allergies   Shellfish allergy and Latex   Review of Systems Review of Systems Per HPI  Physical Exam Triage Vital Signs ED Triage Vitals  Encounter Vitals Group     BP 07/06/23 1619 136/77     Systolic BP Percentile --      Diastolic BP Percentile --      Pulse Rate 07/06/23 1619 89     Resp 07/06/23 1619 18     Temp 07/06/23 1619 97.8 F (36.6 C)     Temp Source 07/06/23 1619 Oral     SpO2 07/06/23 1619 99 %     Weight --      Height --       Head Circumference --      Peak Flow --      Pain Score 07/06/23 1621 0     Pain Loc --      Pain Education --      Exclude from Growth Chart --    No data found.  Updated Vital Signs BP 136/77   Pulse 89   Temp 97.8 F (36.6 C) (Oral)   Resp 18   SpO2 99%   Visual Acuity Right Eye Distance:   Left Eye Distance:   Bilateral Distance:    Right Eye Near:   Left Eye Near:    Bilateral Near:     Physical Exam Constitutional:      General: She is not in acute distress.    Appearance: Normal appearance. She is not toxic-appearing or diaphoretic.  HENT:     Head: Normocephalic and atraumatic.     Right Ear: Tympanic membrane and ear canal normal.     Left Ear: Tympanic membrane and ear canal normal.     Nose: Congestion present.     Mouth/Throat:     Mouth: Mucous membranes are moist.     Pharynx: No posterior oropharyngeal erythema.  Eyes:     Extraocular Movements: Extraocular movements intact.     Conjunctiva/sclera: Conjunctivae normal.     Pupils: Pupils are equal, round, and reactive to light.  Cardiovascular:     Rate and Rhythm: Normal rate and regular rhythm.     Pulses: Normal pulses.     Heart sounds: Normal heart sounds.  Pulmonary:     Effort: Pulmonary effort is normal. No respiratory distress.     Breath sounds: Normal breath sounds. No stridor. No wheezing, rhonchi or rales.     Comments: Very harsh coughing noted on exam. Abdominal:     General: Abdomen is flat. Bowel sounds are normal.     Palpations:  Abdomen is soft.  Musculoskeletal:        General: Normal range of motion.     Cervical back: Normal range of motion.  Skin:    General: Skin is warm and dry.  Neurological:     General: No focal deficit present.     Mental Status: She is alert and oriented to person, place, and time. Mental status is at baseline.  Psychiatric:        Mood and Affect: Mood normal.        Behavior: Behavior normal.      UC Treatments / Results  Labs (all  labs ordered are listed, but only abnormal results are displayed) Labs Reviewed - No data to display  EKG   Radiology DG Chest 2 View  Result Date: 07/06/2023 CLINICAL DATA:  Cough 2 weeks. EXAM: CHEST - 2 VIEW COMPARISON:  Two-view chest x-ray 03/03/2021 FINDINGS: The heart size is normal. Mild central airway thickening is present. No focal airspace disease is present. No edema or effusion is present. Cervical spine fusion noted. IMPRESSION: Mild central airway thickening without focal airspace disease. This is nonspecific, but can be seen in the setting of a viral process or reactive airways disease. Electronically Signed   By: Marin Roberts M.D.   On: 07/06/2023 17:04    Procedures Procedures (including critical care time)  Medications Ordered in UC Medications  methylPREDNISolone acetate (DEPO-MEDROL) injection 80 mg (80 mg Intramuscular Given 07/06/23 1723)    Initial Impression / Assessment and Plan / UC Course  I have reviewed the triage vital signs and the nursing notes.  Pertinent labs & imaging results that were available during my care of the patient were reviewed by me and considered in my medical decision making (see chart for details).     Chest x-ray completed that showed reactive airway disease but no obvious acute infection.  Suspect viral cause of symptoms but given upper respiratory symptoms, will treat with doxycycline antibiotic.  I do think steroid therapy would be reasonable as well given harsh coughing noted and concern for asthma versus COPD exacerbation.  IM steroid administered today and patient was prescribed prescribed steroid burst to start taking tomorrow.  Patient requesting Diflucan as antibiotics typically give her a yeast infection so this was prescribed to take at first sign of vaginal yeast. Patient states she was sent Promethazine DM from PCP today. Advised strict follow precautions.  Patient verbalized understanding and was agreeable with  plan. Final Clinical Impressions(s) / UC Diagnoses   Final diagnoses:  Persistent cough  Acute upper respiratory infection     Discharge Instructions      You are given a steroid shot in urgent care today.   I have prescribed an antibiotic and steroid to take at home.  Start taking steroid tomorrow.     ED Prescriptions     Medication Sig Dispense Auth. Provider   doxycycline (VIBRAMYCIN) 100 MG capsule Take 1 capsule (100 mg total) by mouth 2 (two) times daily for 7 days. 14 capsule [place], [place], [place]   predniSONE (DELTASONE) 20 MG tablet Take 2 tablets (40 mg total) by mouth daily for 5 days. 10 tablet [place], [place], [place]   fluconazole (DIFLUCAN) 150 MG tablet Take 1 tablet (150 mg total) by mouth daily. Take at first sign of vaginal yeast 1 tablet [place], Acie Fredrickson, [place]      PDMP not reviewed this encounter.   Gustavus Bryant, [place] 07/06/23 [id number]

## 2023-07-06 NOTE — Discharge Instructions (Signed)
You are given a steroid shot in urgent care today.   I have prescribed an antibiotic and steroid to take at home.  Start taking steroid tomorrow.

## 2023-07-06 NOTE — Telephone Encounter (Signed)
Complete

## 2023-07-06 NOTE — ED Triage Notes (Addendum)
C/O right-sided rhinorrhea and dry cough x 2 wks.  Had a few leftover Augmentin left over from July that she has taken. Has been using Spiriva. STates is out of her albuterol HFA.  Reports negative Covid test at work today. States PCP office just sent over Rx for promethazine DM today.

## 2023-07-23 ENCOUNTER — Ambulatory Visit (HOSPITAL_COMMUNITY): Payer: 59 | Admitting: Psychiatry

## 2023-07-23 ENCOUNTER — Encounter (HOSPITAL_COMMUNITY): Payer: Self-pay

## 2023-07-23 ENCOUNTER — Telehealth (HOSPITAL_COMMUNITY): Payer: 59 | Admitting: Psychiatry

## 2023-08-03 ENCOUNTER — Telehealth: Payer: Self-pay | Admitting: Family

## 2023-08-17 ENCOUNTER — Ambulatory Visit
Admission: EM | Admit: 2023-08-17 | Discharge: 2023-08-17 | Disposition: A | Discharge status: Home / Self Care | Payer: 59 | Attending: Internal Medicine | Admitting: Internal Medicine

## 2023-08-17 ENCOUNTER — Encounter: Payer: Self-pay | Admitting: *Deleted

## 2023-08-17 ENCOUNTER — Other Ambulatory Visit: Payer: Self-pay

## 2023-08-17 ENCOUNTER — Ambulatory Visit (INDEPENDENT_AMBULATORY_CARE_PROVIDER_SITE_OTHER): Payer: PRIVATE HEALTH INSURANCE

## 2023-08-17 DIAGNOSIS — R1084 Generalized abdominal pain: Secondary | ICD-10-CM | POA: Diagnosis not present

## 2023-08-17 DIAGNOSIS — I878 Other specified disorders of veins: Secondary | ICD-10-CM | POA: Diagnosis not present

## 2023-08-17 DIAGNOSIS — R109 Unspecified abdominal pain: Secondary | ICD-10-CM | POA: Diagnosis not present

## 2023-08-17 LAB — POCT URINALYSIS DIP (MANUAL ENTRY)
Bilirubin, UA: NEGATIVE
Glucose, UA: NEGATIVE mg/dL
Ketones, POC UA: NEGATIVE mg/dL
Leukocytes, UA: NEGATIVE
Nitrite, UA: NEGATIVE
Protein Ur, POC: NEGATIVE mg/dL
Spec Grav, UA: 1.02 (ref 1.010–1.025)
Urobilinogen, UA: 0.2 U/dL
pH, UA: 7 (ref 5.0–8.0)

## 2023-08-17 LAB — POCT URINE PREGNANCY: Preg Test, Ur: NEGATIVE

## 2023-08-17 NOTE — ED Provider Notes (Signed)
[facility]    CSN: [id number] Arrival date & time: 08/17/23  4098      History   Chief Complaint Chief Complaint  Patient presents with   Abdominal Pain    HPI [name] is a 52 y.o. female.   Patient presents with abdominal pain that radiates around to her lower back that started about 3 days ago.  Patient reports the pain is constant and described as a "pulling pain".  She rates the pain 5/10 on pain scale.  Reports that she originally thought that she was constipated so she took some Senna with production of a bowel movement.  Then, the pain continued so she thought it was gas related.  She took Gas-X with some improvement.  Denies blood in stool.  She reports that she has been nauseous but has not vomited as she took a Zofran for this.  Denies dysuria, urinary frequency, hematuria.  Denies any new vaginal discharge or exposure to STD.  She does report that she is sexually active with last being active approximately 2 weeks ago.  Patient is not sure of last menstrual cycle but reports that this is baseline for her.  She was exposed to COVID at work but she took a COVID test that was negative.  Patient states that her gallbladder has been removed.  Denies history of chronic gastrointestinal problems.  She has taken ibuprofen for pain with improvement.   Abdominal Pain   Past Medical History:  Diagnosis Date   Anemia    Anxiety    Arthritis    right knee, back   COPD (chronic obstructive pulmonary disease) (HCC)    GERD (gastroesophageal reflux disease)    Headache(784.0)    History of blood transfusion    Hx; of in 1991 after delivery   History of TIAs    Hypertension    Numbness    Right hand   Pneumonia    Stroke (HCC) 05/2013    Patient Active Problem List   Diagnosis Date Noted   Adjustment disorder with mixed anxiety and depressed mood 04/19/2023   Hyperlipidemia 11/16/2022   Diabetes mellitus, type 2 (HCC) 11/16/2022   Trochanteric bursitis,  left hip 09/03/2022   Asthmatic bronchitis with acute exacerbation 04/09/2022   Chronic cough 01/27/2022   Right thyroid nodule 01/07/2022   Essential hypertension 01/05/2022   Migraine 01/05/2022   Thyroid nodule greater than or equal to 1.5 cm in diameter incidentally noted on imaging study 12/01/2021   Grief 02/06/2021   Non-intractable vomiting 02/06/2021   Arthritis of right knee    S/P knee replacement 01/06/2021   Morning headache 06/02/2017   Leiomyoma of body of uterus 12/24/2016   Prediabetes 11/11/2016   Pap smear abnormality of cervix with ASCUS favoring benign 01/01/2016   COPD (chronic obstructive pulmonary disease) (HCC) 11/18/2015   GERD (gastroesophageal reflux disease) 11/18/2015   Abnormal imaging of thyroid 11/04/2015   COPD exacerbation (HCC) 11/01/2015   Elevated d-dimer 11/01/2015   IDA (iron deficiency anemia) 09/04/2015   Patellofemoral disorder 04/04/2015   Insomnia 04/09/2014   S/P cervical spinal fusion 03/06/2014   Class 2 severe obesity due to excess calories with serious comorbidity and body mass index (BMI) of 37.0 to 37.9 in adult (HCC) 02/22/2014   Radicular pain in right arm 01/11/2014   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   Other and unspecified hyperlipidemia 08/24/2013   History of CVA (cerebrovascular accident) 05/18/2013   Tobacco abuse, 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  Past Surgical History:  Procedure Laterality Date   ANTERIOR CERVICAL DECOMP/DISCECTOMY FUSION N/A 03/06/2014   Procedure: ANTERIOR CERVICAL DECOMPRESSION/DISCECTOMY FUSION 2 LEVELS four/five, five/six;  Surgeon: Tia Alert, MD;  Location: [facility] NEURO ORS;  Service: Neurosurgery;  Laterality: N/A;   CESAREAN SECTION     x 1 with 5th pregnancy   CHOLECYSTECTOMY     KNEE ARTHROSCOPY Right 2015   TEE WITHOUT CARDIOVERSION N/A 05/21/2013   Procedure: TRANSESOPHAGEAL ECHOCARDIOGRAM (TEE);  Surgeon: Quintella Reichert, MD;  Location: [facility] ENDOSCOPY;  Service: Cardiovascular;   Laterality: N/A;   TOTAL KNEE ARTHROPLASTY Right 01/06/2021   TOTAL KNEE ARTHROPLASTY Right 01/06/2021   Procedure: RIGHT TOTAL KNEE ARTHROPLASTY;  Surgeon: Cammy Copa, MD;  Location: [facility] OR;  Service: Orthopedics;  Laterality: Right;   TUBAL LIGATION      OB History   No obstetric history on file.      Home Medications    Prior to Admission medications   Medication Sig Start Date End Date Taking? Authorizing Provider  amLODipine (NORVASC) 5 MG tablet Take 1 tablet (5 mg total) by mouth daily. 05/25/23 08/23/23 Yes Zonia Kief, Amy J, NP  atorvastatin (LIPITOR) 20 MG tablet Take 1 tablet (20 mg total) by mouth daily. 05/25/23  Yes Zonia Kief, Amy J, NP  fluticasone-salmeterol (ADVAIR DISKUS) 250-50 MCG/ACT AEPB Inhale 1 puff into the lungs in the morning and at bedtime. 09/24/22  Yes Luciano Cutter, MD  hydrOXYzine (VISTARIL) 25 MG capsule Take 1 capsule (25 mg total) by mouth every 8 (eight) hours as needed. 05/25/23  Yes Zonia Kief, Amy J, NP  metFORMIN (GLUCOPHAGE-XR) 500 MG 24 hr tablet Take 1 tablet (500 mg total) by mouth 2 (two) times daily with a meal. 05/25/23  Yes Zonia Kief, Amy J, NP  omeprazole (PRILOSEC) 40 MG capsule Take 1 capsule (40 mg total) by mouth daily. 05/25/23 08/23/23 Yes Zonia Kief, Amy J, NP  SUMAtriptan (IMITREX) 25 MG tablet Take 25 mg (1 tablet total) by mouth at the start of the headache. May repeat in 2 hours x 1 if headache persists. Max of 2 tablets/24 hours. 05/25/23  Yes Zonia Kief, Amy J, NP  Tiotropium Bromide Monohydrate (SPIRIVA RESPIMAT) 2.5 MCG/ACT AERS Inhale 2 puffs into the lungs daily. 03/15/22  Yes Luciano Cutter, MD  traMADol (ULTRAM) 50 MG tablet Take 1 tablet (50 mg total) by mouth every 8 (eight) hours as needed for moderate pain or severe pain. 01/31/23  Yes Ellin Goodie E, NP  albuterol (PROVENTIL) (2.5 MG/3ML) 0.083% nebulizer solution Take 3 mLs by nebulization every 6 (six) hours as needed for wheezing or shortness of breath. 03/15/23    Luciano Cutter, MD  albuterol (VENTOLIN HFA) 108 (90 Base) MCG/ACT inhaler Inhale 2 puffs into the lungs every 6 (six) hours as needed for wheezing or shortness of breath. 09/24/22   Luciano Cutter, MD  citalopram (CELEXA) 20 MG tablet Take 1 tablet (20 mg total) by mouth daily. 05/25/23 08/23/23  Rema Fendt, NP  ferrous sulfate 325 (65 FE) MG tablet Take 1 tablet (325 mg total) by mouth every other day. 01/05/22   Mayers, Cari S, PA-C  fluconazole (DIFLUCAN) 150 MG tablet Take 1 tablet (150 mg total) by mouth daily. Take at first sign of vaginal yeast 07/06/23   Gustavus Bryant, FNP  gabapentin (NEURONTIN) 100 MG capsule Take 1 capsule (100 mg total) by mouth 4 (four) times daily. Patient not taking: Reported on 05/25/2023 06/03/22   Rema Fendt, NP  methocarbamol (ROBAXIN) 500 MG tablet Take 1 tablet (500 mg total) by mouth 4 (four) times daily. Patient not taking: Reported on 05/25/2023 11/29/22   Cammy Copa, MD  promethazine-dextromethorphan (PROMETHAZINE-DM) 6.25-15 MG/5ML syrup Take 5 mLs by mouth 4 (four) times daily as needed for cough. 07/06/23   Rema Fendt, NP    Family History Family History  Problem Relation Age of Onset   Renal Disease Father        dialysis   Hypertension Father    Heart disease Mother    Heart disease Maternal Grandfather    Asthma Sister    Asthma Maternal Aunt     Social History Social History   Tobacco Use   Smoking status: Former    Current packs/day: 0.00    Average packs/day: 0.5 packs/day for 26.0 years (13.0 ttl pk-yrs)    Types: Cigarettes    Start date: 10/08/1989    Quit date: 10/09/2015    Years since quitting: 7.8    Passive exposure: Past   Smokeless tobacco: Former  Building services engineer status: Never Used  Substance Use Topics   Alcohol use: Yes    Comment: occasionally   Drug use: No     Allergies   Shellfish allergy and Latex   Review of Systems Review of Systems Per HPI  Physical Exam Triage  Vital Signs ED Triage Vitals  Encounter Vitals Group     BP 08/17/23 0838 131/86     Systolic BP Percentile --      Diastolic BP Percentile --      Pulse Rate 08/17/23 0838 73     Resp 08/17/23 0838 16     Temp 08/17/23 0838 98 F (36.7 C)     Temp Source 08/17/23 0838 Oral     SpO2 08/17/23 0838 97 %     Weight 08/17/23 0836 190 lb (86.2 kg)     Height 08/17/23 0836 5' 3.5" (1.613 m)     Head Circumference --      Peak Flow --      Pain Score 08/17/23 0836 5     Pain Loc --      Pain Education --      Exclude from Growth Chart --    No data found.  Updated Vital Signs BP 131/86 (BP Location: Left Arm)   Pulse 73   Temp 98 F (36.7 C) (Oral)   Resp 16   Ht 5' 3.5" (1.613 m)   Wt 190 lb (86.2 kg)   SpO2 97%   BMI 33.13 kg/m   Visual Acuity Right Eye Distance:   Left Eye Distance:   Bilateral Distance:    Right Eye Near:   Left Eye Near:    Bilateral Near:     Physical Exam Constitutional:      General: She is not in acute distress.    Appearance: Normal appearance. She is not toxic-appearing or diaphoretic.  HENT:     Head: Normocephalic and atraumatic.  Eyes:     Extraocular Movements: Extraocular movements intact.     Conjunctiva/sclera: Conjunctivae normal.  Cardiovascular:     Rate and Rhythm: Normal rate and regular rhythm.     Pulses: Normal pulses.     Heart sounds: Normal heart sounds.  Pulmonary:     Effort: Pulmonary effort is normal. No respiratory distress.     Breath sounds: Normal breath sounds.  Abdominal:     General: There is no distension.  Palpations: Abdomen is soft.     Tenderness: There is abdominal tenderness.       Comments: Mild tenderness to palpation to right side of abdomen directly beside umbilicus.  No tenderness to palpation to right lower back or flank.  Neurological:     General: No focal deficit present.     Mental Status: She is alert and oriented to person, place, and time. Mental status is at baseline.   Psychiatric:        Mood and Affect: Mood normal.        Behavior: Behavior normal.        Thought Content: Thought content normal.        Judgment: Judgment normal.      UC Treatments / Results  Labs (all labs ordered are listed, but only abnormal results are displayed) Labs Reviewed  POCT URINALYSIS DIP (MANUAL ENTRY) - Abnormal; Notable for the following components:      Result Value   Blood, UA trace-intact (*)    All other components within normal limits  CBC  COMPREHENSIVE METABOLIC PANEL  POCT URINE PREGNANCY    EKG   Radiology DG Abdomen 1 View  Result Date: 08/17/2023 CLINICAL DATA:  Flank/abdominal pain EXAM: ABDOMEN - 1 VIEW COMPARISON:  None Available. FINDINGS: Nonobstructive bowel gas pattern.  Normal colonic stool burden. Calcified pelvic phleboliths. Cholecystectomy clips. Visualized osseous structures are within normal limits. IMPRESSION: Negative. Electronically Signed   By: Charline Bills M.D.   On: 08/17/2023 10:23    Procedures Procedures (including critical care time)  Medications Ordered in UC Medications - No data to display  Initial Impression / Assessment and Plan / UC Course  I have reviewed the triage vital signs and the nursing notes.  Pertinent labs & imaging results that were available during my care of the patient were reviewed by me and considered in my medical decision making (see chart for details).     Given location of abdominal pain, recommended CT imaging of the abdomen which cannot be performed in urgent care, so the patient was advised to go to the ER for further evaluation and management.  Patient declined going to the ER.  Risks associated with not going to the emergency department were discussed with patient and patient voiced understanding.  Given patient is declining ER evaluation, will do limited evaluation here in urgent care.  KUB was performed which is negative for any acute abnormality.  Differential diagnoses include  kidney stone versus constipation versus gastroenteritis.  I have a very low suspicion for appendicitis.  UA was unremarkable.  Will obtain CMP and CBC.  Awaiting results.  Advised patient to go straight to the emergency department if symptoms persist or worsen.  Patient verbalized understanding and was agreeable with plan. Final Clinical Impressions(s) / UC Diagnoses   Final diagnoses:  Generalized abdominal pain  Flank pain     Discharge Instructions      I will call with x-ray results.  Blood work is pending.  Go to the emergency department if symptoms persist or worsen.     ED Prescriptions   None    PDMP not reviewed this encounter.   Gustavus Bryant, [place] 08/17/23 1040

## 2023-08-17 NOTE — ED Triage Notes (Signed)
C/o generalized abd pain since Monday. She thought it was constipation so took senna and had BM. Continues to have pain which is also in her right flank. Denies urinary Sx. Exposure to covid at work. She had neg home covid test on monday

## 2023-08-17 NOTE — Discharge Instructions (Signed)
I will call with x-ray results.  Blood work is pending.  Go to the emergency department if symptoms persist or worsen.

## 2023-08-18 ENCOUNTER — Encounter: Payer: Self-pay | Admitting: Family

## 2023-08-18 LAB — COMPREHENSIVE METABOLIC PANEL
ALT: 12 [IU]/L (ref 0–32)
AST: 13 [IU]/L (ref 0–40)
Albumin: 4 g/dL (ref 3.8–4.9)
Alkaline Phosphatase: 74 [IU]/L (ref 44–121)
BUN/Creatinine Ratio: 19 (ref 9–23)
BUN: 13 mg/dL (ref 6–24)
Bilirubin Total: <0.2 mg/dL (ref 0.0–1.2)
CO2: 26 mmol/L (ref 20–29)
Calcium: 8.6 mg/dL — ABNORMAL LOW (ref 8.7–10.2)
Chloride: 103 mmol/L (ref 96–106)
Creatinine, Ser: 0.69 mg/dL (ref 0.57–1.00)
Globulin, Total: 2.4 g/dL (ref 1.5–4.5)
Glucose: 95 mg/dL (ref 70–99)
Potassium: 4.2 mmol/L (ref 3.5–5.2)
Sodium: 142 mmol/L (ref 134–144)
Total Protein: 6.4 g/dL (ref 6.0–8.5)
eGFR: 103 mL/min/{1.73_m2} (ref >59)

## 2023-08-18 LAB — CBC
Hematocrit: 40.3 % (ref 34.0–46.6)
Hemoglobin: 12.3 g/dL (ref 11.1–15.9)
MCH: 25.3 pg — ABNORMAL LOW (ref 26.6–33.0)
MCHC: 30.5 g/dL — ABNORMAL LOW (ref 31.5–35.7)
MCV: 83 fL (ref 79–97)
Platelets: 241 10*3/uL (ref 150–450)
RBC: 4.86 x10E6/uL (ref 3.77–5.28)
RDW: 16.8 % — ABNORMAL HIGH (ref 11.7–15.4)
WBC: 6.1 10*3/uL (ref 3.4–10.8)

## 2023-08-19 ENCOUNTER — Other Ambulatory Visit: Payer: Self-pay | Admitting: Family

## 2023-08-19 DIAGNOSIS — F32A Depression, unspecified: Secondary | ICD-10-CM

## 2023-08-19 MED ORDER — CITALOPRAM HYDROBROMIDE 20 MG PO TABS
20.0000 mg | ORAL_TABLET | Freq: Every day | ORAL | 2 refills | Status: DC
Start: 2023-08-19 — End: 2023-09-19

## 2023-08-19 NOTE — Telephone Encounter (Signed)
Complete

## 2023-08-28 ENCOUNTER — Other Ambulatory Visit: Payer: Self-pay | Admitting: Family

## 2023-08-28 DIAGNOSIS — I10 Essential (primary) hypertension: Secondary | ICD-10-CM

## 2023-08-28 DIAGNOSIS — K219 Gastro-esophageal reflux disease without esophagitis: Secondary | ICD-10-CM

## 2023-08-29 ENCOUNTER — Other Ambulatory Visit: Payer: Self-pay

## 2023-08-29 ENCOUNTER — Encounter (INDEPENDENT_AMBULATORY_CARE_PROVIDER_SITE_OTHER): Payer: Self-pay | Admitting: Family

## 2023-08-29 DIAGNOSIS — I10 Essential (primary) hypertension: Secondary | ICD-10-CM

## 2023-08-29 DIAGNOSIS — K219 Gastro-esophageal reflux disease without esophagitis: Secondary | ICD-10-CM

## 2023-08-29 MED ORDER — OMEPRAZOLE 40 MG PO CPDR: 40.0000 mg | DELAYED_RELEASE_CAPSULE | Freq: Every day | ORAL | 0 refills | Status: DC

## 2023-08-29 MED ORDER — AMLODIPINE BESYLATE 5 MG PO TABS: 5.0000 mg | ORAL_TABLET | Freq: Every day | ORAL | 0 refills | Status: DC

## 2023-08-29 NOTE — Progress Notes (Signed)
Erroneous encounter-disregard

## 2023-09-07 ENCOUNTER — Other Ambulatory Visit: Payer: Self-pay | Admitting: Family

## 2023-09-07 DIAGNOSIS — K219 Gastro-esophageal reflux disease without esophagitis: Secondary | ICD-10-CM

## 2023-09-08 ENCOUNTER — Encounter: Payer: Self-pay | Admitting: Family

## 2023-09-16 ENCOUNTER — Other Ambulatory Visit: Payer: Self-pay | Admitting: Family

## 2023-09-16 DIAGNOSIS — R059 Cough, unspecified: Secondary | ICD-10-CM

## 2023-09-19 ENCOUNTER — Encounter: Payer: Self-pay | Admitting: Family

## 2023-09-19 ENCOUNTER — Ambulatory Visit (INDEPENDENT_AMBULATORY_CARE_PROVIDER_SITE_OTHER): Payer: 59 | Admitting: Family

## 2023-09-19 VITALS — BP 129/88 | HR 86 | Temp 98.6°F | Ht 63.5 in | Wt 186.6 lb

## 2023-09-19 DIAGNOSIS — K219 Gastro-esophageal reflux disease without esophagitis: Secondary | ICD-10-CM

## 2023-09-19 DIAGNOSIS — E785 Hyperlipidemia, unspecified: Secondary | ICD-10-CM | POA: Diagnosis not present

## 2023-09-19 DIAGNOSIS — F32A Depression, unspecified: Secondary | ICD-10-CM

## 2023-09-19 DIAGNOSIS — J40 Bronchitis, not specified as acute or chronic: Secondary | ICD-10-CM

## 2023-09-19 DIAGNOSIS — F419 Anxiety disorder, unspecified: Secondary | ICD-10-CM

## 2023-09-19 DIAGNOSIS — I10 Essential (primary) hypertension: Secondary | ICD-10-CM | POA: Diagnosis not present

## 2023-09-19 DIAGNOSIS — E1165 Type 2 diabetes mellitus with hyperglycemia: Secondary | ICD-10-CM

## 2023-09-19 DIAGNOSIS — Z7984 Long term (current) use of oral hypoglycemic drugs: Secondary | ICD-10-CM

## 2023-09-19 LAB — POCT GLYCOSYLATED HEMOGLOBIN (HGB A1C): HbA1c, POC (controlled diabetic range): 6.4 % (ref 0.0–7.0)

## 2023-09-19 MED ORDER — OMEPRAZOLE 40 MG PO CPDR: 40.0000 mg | DELAYED_RELEASE_CAPSULE | Freq: Every day | ORAL | 0 refills | Status: DC

## 2023-09-19 MED ORDER — AMLODIPINE BESYLATE 5 MG PO TABS: 5.0000 mg | ORAL_TABLET | Freq: Every day | ORAL | 0 refills | Status: DC

## 2023-09-19 MED ORDER — CITALOPRAM HYDROBROMIDE 10 MG PO TABS
10.0000 mg | ORAL_TABLET | Freq: Every day | ORAL | 0 refills | Status: DC
Start: 2023-09-19 — End: 2024-01-04

## 2023-09-19 MED ORDER — PREDNISONE 10 MG PO TABS
ORAL_TABLET | ORAL | 0 refills | Status: AC
Start: 2023-09-19 — End: 2023-09-25

## 2023-09-19 MED ORDER — HYDROXYZINE PAMOATE 25 MG PO CAPS: 25.0000 mg | ORAL_CAPSULE | Freq: Three times a day (TID) | ORAL | 2 refills | Status: DC | PRN

## 2023-09-19 MED ORDER — ATORVASTATIN CALCIUM 20 MG PO TABS: 20.0000 mg | ORAL_TABLET | Freq: Every day | ORAL | 0 refills | Status: DC

## 2023-09-19 NOTE — Telephone Encounter (Signed)
Requested medication (s) are due for refill today: yes   Requested medication (s) are on the active medication list: yes   Last refill:  07/06/23 #118 ml 0 refills  Future visit scheduled: today   Notes to clinic:  no refills remain. Do you want to refill Rx?     Requested Prescriptions  Pending Prescriptions Disp Refills   promethazine-dextromethorphan (PROMETHAZINE-DM) 6.25-15 MG/5ML syrup [Pharmacy Med Name: PROMETHAZINE DM     SOL] 118 mL 0    Sig: TAKE 5 ML BY MOUTH  4 TIMES DAILY AS NEEDED FOR COUGH     Ear, Nose, and Throat:  Antitussives/Expectorants Passed - 09/16/2023  4:43 PM      Passed - Valid encounter within last 12 months    Recent Outpatient Visits           3 months ago Annual physical exam   [facility], Amy J, NP   7 months ago Type 2 diabetes mellitus with hyperglycemia, without long-term current use of insulin (HCC)   [place] [facility], Amy J, NP   9 months ago Type 2 diabetes mellitus with hyperglycemia, without long-term current use of insulin [facility])   [place] [facility], [place], NP   10 months ago Encounter to discuss test results   [facility], Amy J, NP   10 months ago Primary hypertension   [place] [facility], [place], NP       Future Appointments             Today Rema Fendt, NP [facility]

## 2023-09-19 NOTE — Progress Notes (Signed)
Patient wants to know if she can get on Ozempic.  Also wants to speak about he bronchiatis.   Patient has multiple concerns to discuss.

## 2023-09-19 NOTE — Progress Notes (Signed)
Patient ID: [name], female    DOB: 06/01/1971  MRN: [id number]  CC: Chronic Conditions Follow-Up  Subjective: [name] is a 51 y.o. female who presents for chronic conditions follow-up.   Her concerns today include:  - Doing well on Amlodipine, no issues/concerns. She does not complain of red flag symptoms such as but not limited to chest pain, shortness of breath, worst headache of life, nausea/vomiting.  - Doing well on Metformin, no issues/concerns. States she prefers to begin Ozempic as alternative. Reports she would like to lose 30 pounds. She is watching what she eats. She does exercise. She denies red flag symptoms associated with diabetes. - Doing well on Atorvastatin, no issues/concerns.  - States anxiety depression related to family issues. States Citalopram 20 mg caused side effects. Reports she is currently doing well on Citalopram 10 mg and Hydroxyzine. She would like referral to a therapist. She denies thoughts of self-harm, suicidal ideations, homicidal ideations. - Doing well on Omeprazole, no issues/concerns.  - Established with Orthopedics for bilateral hip pain.  - Established with Pulmonology for chronic conditions. She requests medication to help with current bronchitis flare. She denies red flag symptoms.    Patient Active Problem List   Diagnosis Date Noted   Adjustment disorder with mixed anxiety and depressed mood 04/19/2023   Hyperlipidemia 11/16/2022   Diabetes mellitus, type 2 (HCC) 11/16/2022   Trochanteric bursitis, left hip 09/03/2022   Asthmatic bronchitis with acute exacerbation 04/09/2022   Chronic cough 01/27/2022   Right thyroid nodule 01/07/2022   Essential hypertension 01/05/2022   Migraine 01/05/2022   Thyroid nodule greater than or equal to 1.5 cm in diameter incidentally noted on imaging study 12/01/2021   Grief 02/06/2021   Non-intractable vomiting 02/06/2021   Arthritis of right knee    S/P knee replacement 01/06/2021    Morning headache 06/02/2017   Leiomyoma of body of uterus 12/24/2016   Prediabetes 11/11/2016   Pap smear abnormality of cervix with ASCUS favoring benign 01/01/2016   COPD (chronic obstructive pulmonary disease) (HCC) 11/18/2015   GERD (gastroesophageal reflux disease) 11/18/2015   Abnormal imaging of thyroid 11/04/2015   COPD exacerbation (HCC) 11/01/2015   Elevated d-dimer 11/01/2015   IDA (iron deficiency anemia) 09/04/2015   Patellofemoral disorder 04/04/2015   Insomnia 04/09/2014   S/P cervical spinal fusion 03/06/2014   Class 2 severe obesity due to excess calories with serious comorbidity and body mass index (BMI) of 37.0 to 37.9 in adult (HCC) 02/22/2014   Radicular pain in right arm 01/11/2014   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   Other and unspecified hyperlipidemia 08/24/2013   History of CVA (cerebrovascular accident) 05/18/2013   Tobacco abuse, 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     Current Outpatient Medications on File Prior to Visit  Medication Sig Dispense Refill   albuterol (VENTOLIN HFA) 108 (90 Base) MCG/ACT inhaler Inhale 2 puffs into the lungs every 6 (six) hours as needed for wheezing or shortness of breath. 8 g 5   amLODipine (NORVASC) 5 MG tablet Take 1 tablet (5 mg total) by mouth daily. 90 tablet 0   ferrous sulfate 325 (65 FE) MG tablet Take 1 tablet (325 mg total) by mouth every other day. 90 tablet 0   fluconazole (DIFLUCAN) 150 MG tablet Take 1 tablet (150 mg total) by mouth daily. Take at first sign of vaginal yeast 1 tablet 0   fluticasone-salmeterol (ADVAIR DISKUS) 250-50 MCG/ACT AEPB Inhale 1 puff into the lungs in the morning  and at bedtime. 60 each 5   gabapentin (NEURONTIN) 100 MG capsule Take 1 capsule (100 mg total) by mouth 4 (four) times daily. 120 capsule 0   metFORMIN (GLUCOPHAGE-XR) 500 MG 24 hr tablet Take 1 tablet (500 mg total) by mouth 2 (two) times daily with a meal. 180 tablet 0   promethazine-dextromethorphan (PROMETHAZINE-DM) 6.25-15  MG/5ML syrup Take 5 mLs by mouth 4 (four) times daily as needed for cough. 118 mL 0   SUMAtriptan (IMITREX) 25 MG tablet Take 25 mg (1 tablet total) by mouth at the start of the headache. May repeat in 2 hours x 1 if headache persists. Max of 2 tablets/24 hours. 30 tablet 1   Tiotropium Bromide Monohydrate (SPIRIVA RESPIMAT) 2.5 MCG/ACT AERS Inhale 2 puffs into the lungs daily. 4 g 5   traMADol (ULTRAM) 50 MG tablet Take 1 tablet (50 mg total) by mouth every 8 (eight) hours as needed for moderate pain or severe pain. 15 tablet 0   No current facility-administered medications on file prior to visit.    Allergies  Allergen Reactions   Shellfish Allergy Itching and Swelling   Latex Itching and Rash    Social History   Socioeconomic History   Marital status: Divorced    Spouse name: Not on file   Number of children: 7   Years of education: GEd   Highest education level: Not on file  Occupational History   Occupation: CNA    Employer: [employer]  Tobacco Use   Smoking status: Former    Current packs/day: 0.00    Average packs/day: 0.5 packs/day for 26.0 years (13.0 ttl pk-yrs)    Types: Cigarettes    Start date: 10/08/1989    Quit date: 10/09/2015    Years since quitting: 7.9    Passive exposure: Past   Smokeless tobacco: Former  Building services engineer status: Never Used  Substance and Sexual Activity   Alcohol use: Yes    Comment: occasionally   Drug use: No   Sexual activity: Not Currently    Birth control/protection: Surgical  Other Topics Concern   Not on file  Social History Narrative   Patient lives at home with her family    She has 7 children   5 grown and out of the house   2 at home 20 and 14   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 Strain: Not on [employer] Insecurity: Not on file  Transportation Needs: Not on file  Physical Activity: Not on file  Stress: Not on file  Social Connections: Not on file   Intimate Partner Violence: Not on file    Family History  Problem Relation Age of Onset   Renal Disease Father        dialysis   Hypertension Father    Heart disease Mother    Heart disease Maternal Grandfather    Asthma Sister    Asthma Maternal Aunt     Past Surgical History:  Procedure Laterality Date   ANTERIOR CERVICAL DECOMP/DISCECTOMY FUSION N/A 03/06/2014   Procedure: ANTERIOR CERVICAL DECOMPRESSION/DISCECTOMY FUSION 2 LEVELS four/five, five/six;  Surgeon: Tia Alert, MD;  Location: [facility] NEURO ORS;  Service: Neurosurgery;  Laterality: N/A;   CESAREAN SECTION     x 1 with 5th pregnancy   CHOLECYSTECTOMY     KNEE ARTHROSCOPY Right 2015   TEE WITHOUT CARDIOVERSION N/A 05/21/2013   Procedure: TRANSESOPHAGEAL ECHOCARDIOGRAM (  TEE);  Surgeon: Quintella Reichert, MD;  Location: [facility] ENDOSCOPY;  Service: Cardiovascular;  Laterality: N/A;   TOTAL KNEE ARTHROPLASTY Right 01/06/2021   TOTAL KNEE ARTHROPLASTY Right 01/06/2021   Procedure: RIGHT TOTAL KNEE ARTHROPLASTY;  Surgeon: Cammy Copa, MD;  Location: [facility] OR;  Service: Orthopedics;  Laterality: Right;   TUBAL LIGATION      ROS: Review of Systems Negative except as stated above  PHYSICAL EXAM: BP 129/88   Pulse 86   Temp 98.6 F (37 C) (Oral)   Ht 5' 3.5" (1.613 m)   Wt 186 lb 9.6 oz (84.6 kg)   SpO2 93%   BMI 32.54 kg/m   Physical Exam HENT:     Head: Normocephalic and atraumatic.     Nose: Nose normal.     Mouth/Throat:     Mouth: Mucous membranes are moist.     Pharynx: Oropharynx is clear.  Eyes:     Extraocular Movements: Extraocular movements intact.     Conjunctiva/sclera: Conjunctivae normal.     Pupils: Pupils are equal, round, and reactive to light.  Cardiovascular:     Rate and Rhythm: Normal rate and regular rhythm.     Pulses: Normal pulses.     Heart sounds: Normal heart sounds.  Pulmonary:     Effort: Pulmonary effort is normal.     Breath sounds: Normal breath sounds.  Musculoskeletal:         General: Normal range of motion.     Cervical back: Normal range of motion and neck supple.  Neurological:     General: No focal deficit present.     Mental Status: She is alert and oriented to person, place, and time.  Psychiatric:        Mood and Affect: Mood normal.        Behavior: Behavior normal.     ASSESSMENT AND PLAN: 1. Primary hypertension - Continue Amlodipine as prescribed.  - Counseled on blood pressure goal of less than 130/80, low-sodium, DASH diet, medication compliance, and 150 minutes of moderate intensity exercise per week as tolerated. Counseled on medication adherence and adverse effects. - Follow-up with primary provider in 3 months or sooner if needed.  - amLODipine (NORVASC) 5 MG tablet; Take 1 tablet (5 mg total) by mouth daily.  Dispense: 90 tablet; Refill: 0  2. Type 2 diabetes mellitus with hyperglycemia, without long-term current use of insulin (HCC) - Patient reports she prefers to discontinue Metformin and trial Semaglutide. - Routine screening. Result pending.  - Discussed the importance of healthy eating habits, low-carbohydrate diet, low-sugar diet, regular aerobic exercise (at least 150 minutes a week as tolerated) and medication compliance to achieve or maintain control of diabetes. - Follow-up with primary provider as scheduled.  - POCT glycosylated hemoglobin (Hb A1C); Future  3. Hyperlipidemia, unspecified hyperlipidemia type - Continue Atorvastatin as prescribed. Counseled on medication adherence/adverse effects.  - Follow-up with primary provider in 3 months or sooner if needed.  - atorvastatin (LIPITOR) 20 MG tablet; Take 1 tablet (20 mg total) by mouth daily.  Dispense: 90 tablet; Refill: 0  4. Anxiety and depression - Patient denies thoughts of self-harm, suicidal ideations, homicidal ideations. - Decrease Citalopram from 20 mg daily to 10 mg daily due to side effects.  - Continue Hydroxyzine as prescribed.  - Referral to Psychiatry  for evaluation/management. During the interim follow-up with primary provider as scheduled until established with referral. - citalopram (CELEXA) 10 MG tablet; Take 1 tablet (10 mg total) by  mouth daily.  Dispense: 90 tablet; Refill: 0 - hydrOXYzine (VISTARIL) 25 MG capsule; Take 1 capsule (25 mg total) by mouth every 8 (eight) hours as needed.  Dispense: 30 capsule; Refill: 2 - Ambulatory referral to Psychiatry  5. Gastroesophageal reflux disease, unspecified whether esophagitis present - Continue Omeprazole as prescribed. Counseled on medication adherence/adverse effects.  - Follow-up with primary provider in 3 months or sooner if needed.  - omeprazole (PRILOSEC) 40 MG capsule; Take 1 capsule (40 mg total) by mouth daily.  Dispense: 90 capsule; Refill: 0  6. Bronchitis - Patient today in office with no cardiopulmonary/acute distress.  - Prednisone as prescribed. Counseled on medication adherence/adverse effects.  - Keep all scheduled appointments with Pulmonology.  - predniSONE (DELTASONE) 10 MG tablet; Take 6 tablets (60 mg total) by mouth daily with breakfast for 1 day, THEN 5 tablets (50 mg total) daily with breakfast for 1 day, THEN 4 tablets (40 mg total) daily with breakfast for 1 day, THEN 3 tablets (30 mg total) daily with breakfast for 1 day, THEN 2 tablets (20 mg total) daily with breakfast for 1 day, THEN 1 tablet (10 mg total) daily with breakfast for 1 day.  Dispense: 21 tablet; Refill: 0    Patient was given the opportunity to ask questions.  Patient verbalized understanding of the plan and was able to repeat key elements of the plan. Patient was given clear instructions to go to Emergency Department or return to medical center if symptoms don't improve, worsen, or new problems develop.The patient verbalized understanding.   Orders Placed This Encounter  Procedures   Ambulatory referral to Psychiatry   POCT glycosylated hemoglobin (Hb A1C)     Requested Prescriptions    Signed Prescriptions Disp Refills   amLODipine (NORVASC) 5 MG tablet 90 tablet 0    Sig: Take 1 tablet (5 mg total) by mouth daily.   atorvastatin (LIPITOR) 20 MG tablet 90 tablet 0    Sig: Take 1 tablet (20 mg total) by mouth daily.   citalopram (CELEXA) 10 MG tablet 90 tablet 0    Sig: Take 1 tablet (10 mg total) by mouth daily.   hydrOXYzine (VISTARIL) 25 MG capsule 30 capsule 2    Sig: Take 1 capsule (25 mg total) by mouth every 8 (eight) hours as needed.   omeprazole (PRILOSEC) 40 MG capsule 90 capsule 0    Sig: Take 1 capsule (40 mg total) by mouth daily.   predniSONE (DELTASONE) 10 MG tablet 21 tablet 0    Sig: Take 6 tablets (60 mg total) by mouth daily with breakfast for 1 day, THEN 5 tablets (50 mg total) daily with breakfast for 1 day, THEN 4 tablets (40 mg total) daily with breakfast for 1 day, THEN 3 tablets (30 mg total) daily with breakfast for 1 day, THEN 2 tablets (20 mg total) daily with breakfast for 1 day, THEN 1 tablet (10 mg total) daily with breakfast for 1 day.    Return in about 3 months (around 12/20/2023) for Follow-Up or next available chronic conditions.  Rema Fendt, NP

## 2023-09-20 ENCOUNTER — Other Ambulatory Visit: Payer: Self-pay | Admitting: Family

## 2023-09-20 DIAGNOSIS — E1165 Type 2 diabetes mellitus with hyperglycemia: Secondary | ICD-10-CM

## 2023-09-20 DIAGNOSIS — R059 Cough, unspecified: Secondary | ICD-10-CM

## 2023-09-20 MED ORDER — OZEMPIC (0.25 OR 0.5 MG/DOSE) 2 MG/3ML ~~LOC~~ SOPN
0.2500 mg | PEN_INJECTOR | SUBCUTANEOUS | 0 refills | Status: DC
Start: 2023-09-20 — End: 2023-09-23

## 2023-09-21 ENCOUNTER — Telehealth: Payer: Self-pay | Admitting: Family

## 2023-09-21 ENCOUNTER — Encounter: Payer: Self-pay | Admitting: Family

## 2023-09-21 NOTE — Telephone Encounter (Signed)
Complete

## 2023-09-21 NOTE — Telephone Encounter (Signed)
Copied from CRM [id number]. Topic: General - Other >> Sep 21, 2023 12:17 PM Ja-Kwan M wrote: Reason for CRM: Pt stated that she is being told that her insurance will not cover the Ozempic so she needs a Rx to be sent for Trulicity because it is covered by her insurance.

## 2023-09-22 ENCOUNTER — Telehealth: Payer: Self-pay | Admitting: Family

## 2023-09-22 NOTE — Telephone Encounter (Signed)
Patient called and says she was at the pharmacy on yesterday and was told the insurance doesn't cover Ozempic, but will cover Trulicity and they will need a Rx for that. She also says since she's not taking anything is it ok to take the Metformin until she gets the Trulicity. I asked is she checking blood sugars at home, she says no. Advised I will send this to Amy and someone will call her back with the recommendation.

## 2023-09-22 NOTE — Telephone Encounter (Signed)
Pt called in anout status with Ozempic being denied. She is asking can she get Trulicity approved since problem with Ozempic. She also wants to to know should she go back to taking Metformin while waiting on status of Ozempic, since she hasn't taken any for 2 days. Please cb to discuss

## 2023-09-22 NOTE — Telephone Encounter (Signed)
Pt called requesting follow up on request, says she has been calling this about since yesterday. Trasnferred to NT, patitent is frustrated

## 2023-09-23 ENCOUNTER — Other Ambulatory Visit: Payer: Self-pay | Admitting: Family

## 2023-09-23 DIAGNOSIS — E1165 Type 2 diabetes mellitus with hyperglycemia: Secondary | ICD-10-CM

## 2023-09-23 MED ORDER — DULAGLUTIDE 0.75 MG/0.5ML ~~LOC~~ SOAJ
0.7500 mg | SUBCUTANEOUS | 0 refills | Status: DC
Start: 2023-09-23 — End: 2023-12-09

## 2023-09-23 NOTE — Telephone Encounter (Signed)
Prescribed 09/21/2023.

## 2023-09-23 NOTE — Telephone Encounter (Signed)
Complete

## 2023-09-23 NOTE — Telephone Encounter (Signed)
Left message on voicemail  informing that Rx was sent to pharmacy. May call office for additional concerns.

## 2023-10-03 ENCOUNTER — Telehealth: Payer: Self-pay

## 2023-10-03 NOTE — Telephone Encounter (Signed)
Good afternoon Tresa Endo,  Can you help me with this PA? Ozempic (0.25&0.5) 2Mg /3ML Pen. Thank you.

## 2023-10-04 ENCOUNTER — Other Ambulatory Visit: Payer: Self-pay

## 2023-10-04 NOTE — Telephone Encounter (Signed)
If you dont mind, I'm just going off the papers I received.

## 2023-10-07 ENCOUNTER — Other Ambulatory Visit: Payer: Self-pay | Admitting: Family

## 2023-10-07 DIAGNOSIS — E1165 Type 2 diabetes mellitus with hyperglycemia: Secondary | ICD-10-CM

## 2023-10-10 ENCOUNTER — Other Ambulatory Visit: Payer: Self-pay

## 2023-10-10 NOTE — Telephone Encounter (Signed)
During office visit 09/19/2023 patient stated she prefers to begin Ozempic as alternative to Metformin XR. Please confirm with patient and let me know if I can further assist/if she needs refills. Thank you.

## 2023-10-12 NOTE — Telephone Encounter (Signed)
Noted  

## 2023-10-12 NOTE — Telephone Encounter (Signed)
Spoke to patient, she states she is off metformin and now on Dulaglutide (Trulicity) in place of that.

## 2023-10-28 ENCOUNTER — Encounter: Payer: Self-pay | Admitting: Family

## 2023-10-28 NOTE — Telephone Encounter (Signed)
 Care team updated and letter sent for eye exam notes.

## 2023-12-09 ENCOUNTER — Other Ambulatory Visit: Payer: Self-pay | Admitting: Family

## 2023-12-09 DIAGNOSIS — E1165 Type 2 diabetes mellitus with hyperglycemia: Secondary | ICD-10-CM

## 2023-12-09 NOTE — Telephone Encounter (Signed)
 Complete

## 2023-12-15 ENCOUNTER — Telehealth: Payer: Self-pay

## 2023-12-15 ENCOUNTER — Other Ambulatory Visit: Payer: Self-pay | Admitting: Family

## 2023-12-15 DIAGNOSIS — R059 Cough, unspecified: Secondary | ICD-10-CM

## 2023-12-15 MED ORDER — PROMETHAZINE-DM 6.25-15 MG/5ML PO SYRP
5.0000 mL | ORAL_SOLUTION | Freq: Four times a day (QID) | ORAL | 0 refills | Status: DC | PRN
Start: 2023-12-15 — End: 2024-03-28

## 2023-12-15 NOTE — Telephone Encounter (Signed)
 Good morning Loetta Ringer, can you give me a hand witht his prior auth?  Dulaglutide (TRULICITY) 0.75 MG/0.5ML SOAJ   Thank you.

## 2023-12-15 NOTE — Telephone Encounter (Signed)
 Complete

## 2023-12-16 ENCOUNTER — Other Ambulatory Visit: Payer: Self-pay

## 2023-12-16 ENCOUNTER — Encounter: Payer: Self-pay | Admitting: Family

## 2023-12-16 ENCOUNTER — Ambulatory Visit (INDEPENDENT_AMBULATORY_CARE_PROVIDER_SITE_OTHER): Payer: 59 | Admitting: Family

## 2023-12-16 VITALS — BP 137/89 | HR 87 | Temp 98.9°F | Resp 18 | Ht 63.5 in | Wt 182.2 lb

## 2023-12-16 DIAGNOSIS — R0989 Other specified symptoms and signs involving the circulatory and respiratory systems: Secondary | ICD-10-CM

## 2023-12-16 LAB — POC SOFIA 2 FLU + SARS ANTIGEN FIA
Influenza A, POC: NEGATIVE
Influenza B, POC: NEGATIVE
SARS Coronavirus 2 Ag: NEGATIVE

## 2023-12-16 LAB — POCT RAPID STREP A (OFFICE): Rapid Strep A Screen: NEGATIVE

## 2023-12-16 MED ORDER — PREDNISONE 10 MG PO TABS
ORAL_TABLET | ORAL | 0 refills | Status: AC
Start: 2023-12-16 — End: 2023-12-22

## 2023-12-16 NOTE — Addendum Note (Signed)
 Addended by: Ardelia Kohut on: 12/16/2023 02:24 PM   Modules accepted: Orders

## 2023-12-16 NOTE — Progress Notes (Signed)
 Patient ID: [name], female    DOB: 1971/10/15  MRN: [id number]  CC: Cough  Subjective: [name] is a 53 y.o. female who presents for cough.  Her concerns today include:  Cough, runny nose, watery eyes, headaches, and chills. Denies red flag symptoms. States there has been a flu outbreak at her job. Doing well on Promethazine -DM syrup, no issues/concerns.  Patient Active Problem List   Diagnosis Date Noted   Adjustment disorder with mixed anxiety and depressed mood 04/19/2023   Hyperlipidemia 11/16/2022   Diabetes mellitus, type 2 (HCC) 11/16/2022   Trochanteric bursitis, left hip 09/03/2022   Asthmatic bronchitis with acute exacerbation 04/09/2022   Chronic cough 01/27/2022   Right thyroid nodule 01/07/2022   Essential hypertension 01/05/2022   Migraine 01/05/2022   Thyroid nodule greater than or equal to 1.5 cm in diameter incidentally noted on imaging study 12/01/2021   Grief 02/06/2021   Non-intractable vomiting 02/06/2021   Arthritis of right knee    S/P knee replacement 01/06/2021   Morning headache 06/02/2017   Leiomyoma of body of uterus 12/24/2016   Prediabetes 11/11/2016   Pap smear abnormality of cervix with ASCUS favoring benign 01/01/2016   COPD (chronic obstructive pulmonary disease) (HCC) 11/18/2015   GERD (gastroesophageal reflux disease) 11/18/2015   Abnormal imaging of thyroid 11/04/2015   COPD exacerbation (HCC) 11/01/2015   Elevated d-dimer 11/01/2015   IDA (iron  deficiency anemia) 09/04/2015   Patellofemoral disorder 04/04/2015   Insomnia 04/09/2014   S/P cervical spinal fusion 03/06/2014   Class 2 severe obesity due to excess calories with serious comorbidity and body mass index (BMI) of 37.0 to 37.9 in adult (HCC) 02/22/2014   Radicular pain in right arm 01/11/2014   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   Other and unspecified hyperlipidemia 08/24/2013   History of CVA (cerebrovascular accident) 05/18/2013   Tobacco abuse, in  remission 05/18/2013     Current Outpatient Medications on File Prior to Visit  Medication Sig Dispense Refill   albuterol  (VENTOLIN  HFA) 108 (90 Base) MCG/ACT inhaler Inhale 2 puffs into the lungs every 6 (six) hours as needed for wheezing or shortness of breath. 8 g 5   amLODipine  (NORVASC ) 5 MG tablet Take 1 tablet (5 mg total) by mouth daily. 90 tablet 0   atorvastatin  (LIPITOR) 20 MG tablet Take 1 tablet (20 mg total) by mouth daily. 90 tablet 0   citalopram  (CELEXA ) 10 MG tablet Take 1 tablet (10 mg total) by mouth daily. 90 tablet 0   Dulaglutide  (TRULICITY ) 0.75 MG/0.5ML SOAJ INJECT 7.5 MG SUBCUTANEOUSLY  ONCE A WEEK 6 mL 0   ferrous sulfate  325 (65 FE) MG tablet Take 1 tablet (325 mg total) by mouth every other day. 90 tablet 0   fluconazole  (DIFLUCAN ) 150 MG tablet Take 1 tablet (150 mg total) by mouth daily. Take at first sign of vaginal yeast 1 tablet 0   fluticasone -salmeterol (ADVAIR  DISKUS) 250-50 MCG/ACT AEPB Inhale 1 puff into the lungs in the morning and at bedtime. 60 each 5   gabapentin  (NEURONTIN ) 100 MG capsule Take 1 capsule (100 mg total) by mouth 4 (four) times daily. 120 capsule 0   hydrOXYzine  (VISTARIL ) 25 MG capsule Take 1 capsule (25 mg total) by mouth every 8 (eight) hours as needed. 30 capsule 2   omeprazole  (PRILOSEC) 40 MG capsule Take 1 capsule (40 mg total) by mouth daily. 90 capsule 0   promethazine -dextromethorphan (PROMETHAZINE -DM) 6.25-15 MG/5ML syrup Take 5 mLs by mouth  4 (four) times daily as needed for cough. 118 mL 0   SUMAtriptan  (IMITREX ) 25 MG tablet Take 25 mg (1 tablet total) by mouth at the start of the headache. May repeat in 2 hours x 1 if headache persists. Max of 2 tablets/24 hours. 30 tablet 1   Tiotropium Bromide  Monohydrate (SPIRIVA  RESPIMAT) 2.5 MCG/ACT AERS Inhale 2 puffs into the lungs daily. 4 g 5   traMADol  (ULTRAM ) 50 MG tablet Take 1 tablet (50 mg total) by mouth every 8 (eight) hours as needed for moderate pain or severe pain. 15  tablet 0   amLODipine  (NORVASC ) 5 MG tablet Take 1 tablet (5 mg total) by mouth daily. 90 tablet 0   No current facility-administered medications on file prior to visit.    Allergies  Allergen Reactions   Shellfish Allergy  Itching and Swelling   Latex Itching and Rash    Social History   Socioeconomic History   Marital status: Divorced    Spouse name: Not on file   Number of children: 7   Years of education: GEd   Highest education level: Not on file  Occupational History   Occupation: CNA    Employer: [employer]  Tobacco Use   Smoking status: Former    Current packs/day: 0.00    Average packs/day: 0.5 packs/day for 26.0 years (13.0 ttl pk-yrs)    Types: Cigarettes    Start date: 10/08/1989    Quit date: 10/09/2015    Years since quitting: 8.1    Passive exposure: Past   Smokeless tobacco: Former  Building Services Engineer status: Never Used  Substance and Sexual Activity   Alcohol  use: Yes    Comment: occasionally   Drug use: No   Sexual activity: Not Currently    Birth control/protection: Surgical  Other Topics Concern   Not on file  Social History Narrative   Patient lives at home with her family    She has 7 children   5 grown and out of the house   2 at home 41 and 14   She works as a LAWYER at [employer]    Social Drivers of Corporate Investment Banker Strain: Not on [employer] Insecurity: Not on file  Transportation Needs: Not on file  Physical Activity: Not on file  Stress: Not on file  Social Connections: Not on file  Intimate Partner Violence: Not on file    Family History  Problem Relation Age of Onset   Renal Disease Father        dialysis   Hypertension Father    Heart disease Mother    Heart disease Maternal Grandfather    Asthma Sister    Asthma Maternal Aunt     Past Surgical History:  Procedure Laterality Date   ANTERIOR CERVICAL DECOMP/DISCECTOMY FUSION N/A 03/06/2014   Procedure: ANTERIOR CERVICAL DECOMPRESSION/DISCECTOMY  FUSION 2 LEVELS four/five, five/six;  Surgeon: Alm GORMAN Molt, MD;  Location: [facility] NEURO ORS;  Service: Neurosurgery;  Laterality: N/A;   CESAREAN SECTION     x 1 with 5th pregnancy   CHOLECYSTECTOMY     KNEE ARTHROSCOPY Right 2015   TEE WITHOUT CARDIOVERSION N/A 05/21/2013   Procedure: TRANSESOPHAGEAL ECHOCARDIOGRAM (TEE);  Surgeon: Wilbert JONELLE Bihari, MD;  Location: [facility] ENDOSCOPY;  Service: Cardiovascular;  Laterality: N/A;   TOTAL KNEE ARTHROPLASTY Right 01/06/2021   TOTAL KNEE ARTHROPLASTY Right 01/06/2021   Procedure: RIGHT TOTAL KNEE ARTHROPLASTY;  Surgeon: Addie Cordella Hamilton, MD;  Location: [facility] OR;  Service: Orthopedics;  Laterality: Right;   TUBAL LIGATION      ROS: Review of Systems Negative except as stated above  PHYSICAL EXAM: BP 137/89 (BP Location: Right Arm, Patient Position: Sitting, Cuff Size: Normal)   Pulse 87   Temp 98.9 F (37.2 C) (Oral)   Resp 18   Ht 5' 3.5 (1.613 m)   Wt 182 lb 3.2 oz (82.6 kg)   SpO2 93%   BMI 31.77 kg/m   Physical Exam HENT:     Head: Normocephalic and atraumatic.     Right Ear: Tympanic membrane, ear canal and external ear normal.     Left Ear: Tympanic membrane, ear canal and external ear normal.     Nose: Nose normal.     Mouth/Throat:     Mouth: Mucous membranes are moist.     Pharynx: Oropharynx is clear.  Eyes:     Extraocular Movements: Extraocular movements intact.     Conjunctiva/sclera: Conjunctivae normal.     Pupils: Pupils are equal, round, and reactive to light.  Cardiovascular:     Rate and Rhythm: Normal rate and regular rhythm.     Pulses: Normal pulses.     Heart sounds: Normal heart sounds.  Pulmonary:     Effort: Pulmonary effort is normal.     Breath sounds: Normal breath sounds.  Musculoskeletal:        General: Normal range of motion.     Cervical back: Normal range of motion and neck supple.  Neurological:     General: No focal deficit present.     Mental Status: She is alert and oriented to person, place,  and time.  Psychiatric:        Mood and Affect: Mood normal.        Behavior: Behavior normal.      ASSESSMENT AND PLAN: 1. Upper respiratory symptom (Primary) - Patient today in office with no cardiopulmonary/acute distress. - Routine screening.  - Continue Promethazine -Dextromethorphan syrup as prescribed. Counseled on medication adherence/adverse effects. No refills needed as of present. - Prednisone  as prescribed. Counseled on medication adherence/adverse effects.  - Follow-up with primary provider as scheduled. - COVID-19, Flu A+B and RSV - POCT rapid strep A; Future - Culture, Group A Strep - predniSONE  (DELTASONE ) 10 MG tablet; Take 6 tablets (60 mg total) by mouth daily with breakfast for 1 day, THEN 5 tablets (50 mg total) daily with breakfast for 1 day, THEN 4 tablets (40 mg total) daily with breakfast for 1 day, THEN 3 tablets (30 mg total) daily with breakfast for 1 day, THEN 2 tablets (20 mg total) daily with breakfast for 1 day, THEN 1 tablet (10 mg total) daily with breakfast for 1 day.  Dispense: 21 tablet; Refill: 0 - POCT rapid strep A     Patient was given the opportunity to ask questions.  Patient verbalized understanding of the plan and was able to repeat key elements of the plan. Patient was given clear instructions to go to Emergency Department or return to medical center if symptoms don't improve, worsen, or new problems develop.The patient verbalized understanding.   Orders Placed This Encounter  Procedures   COVID-19, Flu A+B and RSV   Culture, Group A Strep   POCT rapid strep A     Requested Prescriptions   Signed Prescriptions Disp Refills   predniSONE  (DELTASONE ) 10 MG tablet 21 tablet 0    Sig: Take 6 tablets (60 mg total) by mouth daily with breakfast for 1 day,  THEN 5 tablets (50 mg total) daily with breakfast for 1 day, THEN 4 tablets (40 mg total) daily with breakfast for 1 day, THEN 3 tablets (30 mg total) daily with breakfast for 1 day, THEN  2 tablets (20 mg total) daily with breakfast for 1 day, THEN 1 tablet (10 mg total) daily with breakfast for 1 day.    Follow-up with primary provider as scheduled.  Greig JINNY Drones, NP

## 2023-12-19 ENCOUNTER — Telehealth: Payer: Self-pay | Admitting: Family

## 2023-12-19 NOTE — Telephone Encounter (Signed)
 Copied from CRM [id number]. Topic: Clinical - Medication Question >> Dec 19, 2023  2:50 PM May Sparks wrote:  Pt [name] calling regarding the medication:  Dulaglutide (TRULICITY) 0.75 MG/0.5ML SOAJ Question: What is is the Authorization status?

## 2023-12-20 ENCOUNTER — Other Ambulatory Visit: Payer: Self-pay

## 2023-12-20 ENCOUNTER — Encounter: Payer: 59 | Admitting: Family

## 2023-12-20 ENCOUNTER — Telehealth: Payer: Self-pay

## 2023-12-20 NOTE — Telephone Encounter (Signed)
Pharmacy Patient Advocate Encounter   Received notification from CoverMyMeds that prior authorization for TRULICITY is required/requested.   Insurance verification completed.   The patient is insured through [employer] .   Per test claim: PA required; PA submitted to above mentioned insurance via CoverMyMeds Key/confirmation #/EOC [id number] Status is pending

## 2023-12-20 NOTE — Progress Notes (Signed)
Erroneous encounter-disregard

## 2023-12-20 NOTE — Telephone Encounter (Signed)
Pt is getting frustrated at not hearing anything back concerning this.  It has been a week and she has not had her medicine. Can you please give her an update asap?

## 2023-12-22 ENCOUNTER — Ambulatory Visit: Payer: Self-pay | Admitting: Family

## 2023-12-22 NOTE — Telephone Encounter (Signed)
Reason for Disposition . General information question, no triage required and triager able to answer question  Answer Assessment - Initial Assessment Questions 1. REASON FOR CALL or QUESTION: "What is your reason for calling today?" or "How can I best help you?" or "What question do you have that I can help answer?"     Patient called back for symptoms. Patient was already called by another nurse about use of urgent care. This was explained again to patient. All questions answered.  Protocols used: Information Only Call - No Triage-A-AH

## 2023-12-22 NOTE — Telephone Encounter (Signed)
1st attempt-patient was triaged earlier today. Voicemail left for patient to callback  Copied From CRM [id number]. Reason for Triage: Please call Pt [name], she said she spoke with a nurse this morning but forgot what she said.

## 2023-12-22 NOTE — Telephone Encounter (Signed)
Info only call. Pt was calling back to be reminded what RN advised from earlier triage. RN advised to go to urgent care and that earlier message was sent to provider. Pt verbalized understanding.              Reason for Disposition  General information question, no triage required and triager able to answer question  Answer Assessment - Initial Assessment Questions 1. REASON FOR CALL or QUESTION: "What is your reason for calling today?" or "How can I best help you?" or "What question do you have that I can help answer?"     Pt was triaged this morning but was calling back because she couldn't remember what she was told.  Protocols used: Information Only Call - No Triage-A-AH

## 2023-12-22 NOTE — Telephone Encounter (Signed)
  Chief Complaint: cough Symptoms: wheezing; headache Frequency: constant  Disposition: [] ED /[x] Urgent Care (no appt availability in office) / [] Appointment(In office/virtual)/ []  [facility]/ [] Home Care/ [] Refused Recommended Disposition /[]  Mobile Bus/ []  Follow-up with PCP Additional Notes: Pt calling with cough worsening since being seen with PCP on 2/7. Pt states she has productive cough that is yellow in color, wheezing, and has headache. Pt denies fever. Pt states the coughing creates SOB at times.  No providers in office have appt until end of  February. Pt is requesting an antibiotic without being seen again. RN advised pt to go to urgent care. It is unclear if pt will go. RN gave care advice and pt verbalized understanding.            Copied from CRM [id number]. Topic: Clinical - Red Word Triage >> Dec 22, 2023  9:26 AM Elle L wrote: Red Word that prompted transfer to Nurse Triage: The patient called requesting to speak to a nurse. She was seen in the office on 2/7. However, she has not gotten better. She is congested, cough, loose stools, and has a headache. Reason for Disposition  [1] MILD difficulty breathing (e.g., minimal/no SOB at rest, SOB with walking, pulse <100) AND [2] still present when not coughing  Answer Assessment - Initial Assessment Questions 1. ONSET: "When did the cough begin?"      Last week  2. SEVERITY: "How bad is the cough today?"      "5" 3. SPUTUM: "Describe the color of your sputum" (none, dry cough; clear, white, yellow, green)     yellowish 4. HEMOPTYSIS: "Are you coughing up any blood?" If so ask: "How much?" (flecks, streaks, tablespoons, etc.)     Denies  5. DIFFICULTY BREATHING: "Are you having difficulty breathing?" If Yes, ask: "How bad is it?" (e.g., mild, moderate, severe)    - MILD: No SOB at rest, mild SOB with walking, speaks normally in sentences, can lie down, no retractions, pulse < 100.    - MODERATE: SOB  at rest, SOB with minimal exertion and prefers to sit, cannot lie down flat, speaks in phrases, mild retractions, audible wheezing, pulse 100-120.    - SEVERE: Very SOB at rest, speaks in single words, struggling to breathe, sitting hunched forward, retractions, pulse > 120      Moderate  6. FEVER: "Do you have a fever?" If Yes, ask: "What is your temperature, how was it measured, and when did it start?"     Denies  7. CARDIAC HISTORY: "Do you have any history of heart disease?" (e.g., heart attack, congestive heart failure)      Denies  8. LUNG HISTORY: "Do you have any history of lung disease?"  (e.g., pulmonary embolus, asthma, emphysema)     Bronchitis  9. PE RISK FACTORS: "Do you have a history of blood clots?" (or: recent major surgery, recent prolonged travel, bedridden)     Denies  10. OTHER SYMPTOMS: "Do you have any other symptoms?" (e.g., runny nose, wheezing, chest pain)       Wheezing, headache, chills   12. TRAVEL: "Have you traveled out of the country in the last month?" (e.g., travel history, exposures)       no  Protocols used: Cough - Acute Productive-A-AH

## 2023-12-22 NOTE — Telephone Encounter (Signed)
Noted

## 2023-12-26 ENCOUNTER — Telehealth: Payer: Self-pay

## 2023-12-26 ENCOUNTER — Other Ambulatory Visit: Payer: Self-pay

## 2023-12-26 ENCOUNTER — Other Ambulatory Visit: Payer: Self-pay | Admitting: Family

## 2023-12-26 NOTE — Telephone Encounter (Signed)
Tresa Endo are you aware of this patient PA for Trulicity?

## 2023-12-26 NOTE — Telephone Encounter (Unsigned)
Copied from CRM [id number]. Topic: Clinical - Medication Refill >> Dec 26, 2023  4:53 PM Martha Clan wrote: Most Recent Primary Care Visit:  Provider: Rema Fendt  Department: [facility]  Visit Type: SAME DAY  Date: 12/16/2023  Medication: Dulaglutide (TRULICITY) 0.75 MG/0.5ML Ivory Broad [[id number]]  Has the patient contacted their pharmacy? Yes (Agent: If no, request that the patient contact the pharmacy for the refill. If patient does not wish to contact the pharmacy document the reason why and proceed with request.) (Agent: If yes, when and what did the pharmacy advise?)  Is this the correct pharmacy for this prescription? Yes If no, delete pharmacy and type the correct one.  This is the patient's preferred pharmacy:  [facility] [address] ([address]), [place] - [address] [place] ([place]) [place] [address] Phone: [phone] Fax: [phone]     Has the prescription been filled recently? No  Is the patient out of the medication? Yes  Has the patient been seen for an appointment in the last year OR does the patient have an upcoming appointment? Yes  Can we respond through MyChart? Yes  Agent: Please be advised that Rx refills may take up to 3 business days. We ask that you follow-up with your pharmacy.

## 2023-12-26 NOTE — Telephone Encounter (Signed)
Copied from CRM [id number]. Topic: Clinical - Prescription Issue >> Dec 26, 2023  4:51 PM Martha Clan wrote: Reason for CRM: Dulaglutide (TRULICITY) 0.75 MG/0.5ML Ivory Broad [[id number]] Patient's pharmacy is stating they are still waiting on prior authorization.

## 2023-12-27 ENCOUNTER — Other Ambulatory Visit: Payer: Self-pay

## 2023-12-27 ENCOUNTER — Telehealth: Payer: Self-pay

## 2023-12-27 NOTE — Telephone Encounter (Signed)
Patient is aware of denial. Patient is concern as to what she is to do without medication.

## 2023-12-27 NOTE — Telephone Encounter (Signed)
Pharmacy Patient Advocate Encounter  Received notification from  [facility]  that Prior Authorization for TRULICITY has been DENIED.  Full denial letter will be uploaded to the media tab. See denial reason below.   I called to verify yesterday-insurance requires labs for ALL of the following:  I was not able to find any results other than A1C in her chart records. I have uploaded this request for more information for prior auth processing into media (12/20/2023). [facility] confirmed they will not process/approve prior auth without these lab results being submitted.

## 2023-12-27 NOTE — Telephone Encounter (Signed)
I spoke to patient she is aware of needing labs to help with PA approval

## 2023-12-27 NOTE — Telephone Encounter (Signed)
Spoke with Mardee Postin, about PA for Trulicity per Tresa Endo "patient may need labwork. As of today, no coverage approved for Trulicity. " Routing to patient's provider. Please advise.

## 2023-12-29 NOTE — Telephone Encounter (Signed)
 Patient has upcoming appt

## 2023-12-29 NOTE — Telephone Encounter (Signed)
 Report to Emergency Department/Urgent Care/call 911 for immediate medical evaluation. Follow-up with Primary Care.

## 2023-12-29 NOTE — Telephone Encounter (Signed)
 Schedule appointment?

## 2023-12-29 NOTE — Telephone Encounter (Signed)
Patient has appt coming up to  see pcp

## 2023-12-30 ENCOUNTER — Other Ambulatory Visit: Payer: Self-pay

## 2024-01-04 ENCOUNTER — Ambulatory Visit (INDEPENDENT_AMBULATORY_CARE_PROVIDER_SITE_OTHER): Payer: 59 | Admitting: Family

## 2024-01-04 ENCOUNTER — Encounter: Payer: Self-pay | Admitting: Family

## 2024-01-04 VITALS — BP 114/81 | HR 89 | Temp 98.0°F | Ht 63.5 in | Wt 179.2 lb

## 2024-01-04 DIAGNOSIS — I10 Essential (primary) hypertension: Secondary | ICD-10-CM

## 2024-01-04 DIAGNOSIS — Z7985 Long-term (current) use of injectable non-insulin antidiabetic drugs: Secondary | ICD-10-CM

## 2024-01-04 DIAGNOSIS — G4700 Insomnia, unspecified: Secondary | ICD-10-CM

## 2024-01-04 DIAGNOSIS — E785 Hyperlipidemia, unspecified: Secondary | ICD-10-CM | POA: Diagnosis not present

## 2024-01-04 DIAGNOSIS — J40 Bronchitis, not specified as acute or chronic: Secondary | ICD-10-CM

## 2024-01-04 DIAGNOSIS — E1165 Type 2 diabetes mellitus with hyperglycemia: Secondary | ICD-10-CM

## 2024-01-04 DIAGNOSIS — F411 Generalized anxiety disorder: Secondary | ICD-10-CM

## 2024-01-04 DIAGNOSIS — K219 Gastro-esophageal reflux disease without esophagitis: Secondary | ICD-10-CM

## 2024-01-04 DIAGNOSIS — E119 Type 2 diabetes mellitus without complications: Secondary | ICD-10-CM

## 2024-01-04 MED ORDER — AMLODIPINE BESYLATE 5 MG PO TABS: 5.0000 mg | ORAL_TABLET | Freq: Every day | ORAL | 0 refills | Status: DC

## 2024-01-04 MED ORDER — ATORVASTATIN CALCIUM 20 MG PO TABS: 20.0000 mg | ORAL_TABLET | Freq: Every day | ORAL | 0 refills | Status: DC

## 2024-01-04 MED ORDER — CITALOPRAM HYDROBROMIDE 10 MG PO TABS: 10.0000 mg | ORAL_TABLET | Freq: Every day | ORAL | 0 refills | Status: DC

## 2024-01-04 MED ORDER — TRULICITY 0.75 MG/0.5ML ~~LOC~~ SOAJ: 0.7500 mg | SUBCUTANEOUS | 0 refills | Status: DC

## 2024-01-04 MED ORDER — HYDROXYZINE HCL 50 MG PO TABS: 50.0000 mg | ORAL_TABLET | Freq: Three times a day (TID) | ORAL | 1 refills | Status: DC | PRN

## 2024-01-04 MED ORDER — OMEPRAZOLE 40 MG PO CPDR: 40.0000 mg | DELAYED_RELEASE_CAPSULE | Freq: Every day | ORAL | 0 refills | Status: DC

## 2024-01-04 NOTE — Progress Notes (Signed)
 Patient states she was on Trulicity and now she needs a prior [place].

## 2024-01-04 NOTE — Progress Notes (Signed)
 Patient ID: [name], female    DOB: [birth year]  MRN: [id number]  CC: Chronic Conditions Follow-Up  Subjective: [name] is a 53 y.o. female who presents for chronic conditions follow-up.   Her concerns today include:  - Doing well on Amlodipine, no issues/concerns. She does not complain of red flag symptoms such as but not limited to chest pain, shortness of breath, worst headache of life, nausea/vomiting.  - Reports has not taken Trulicity 0.75 mg in 1 month due to health insurance needs prior authorization and updated hemoglobin A1c. Denies red flag symptoms associated with diabetes. - Doing well on Atorvastatin, no issues/concerns.  - States she only has anxiety no depression. Anxiety related to work-life balance. Doing well on Citalopram, no issues/concerns. Reports doesn't feel Hydroxyzine is helping. Reports Psychiatry never called to schedule appointment. She denies thoughts of self-harm, suicidal ideations, homicidal ideations. - Insomnia. States she does not like the way Trazodone makes her feel. Reports taking over-the-counter Benadryl and Tylenol PM to help with sleep. Reports sometimes her neighbor will give her Ambien which helps. Denies referral to specialist. - Doing well on Omeprazole, no issues/concerns. - Reports she never received a call from Pulmonology. Denies red flag symptoms.  Patient Active Problem List   Diagnosis Date Noted   Adjustment disorder with mixed anxiety and depressed mood 04/19/2023   Hyperlipidemia 11/16/2022   Diabetes mellitus, type 2 (HCC) 11/16/2022   Trochanteric bursitis, left hip 09/03/2022   Asthmatic bronchitis with acute exacerbation 04/09/2022   Chronic cough 01/27/2022   Right thyroid nodule 01/07/2022   Essential hypertension 01/05/2022   Migraine 01/05/2022   Thyroid nodule greater than or equal to 1.5 cm in diameter incidentally noted on imaging study 12/01/2021   Grief 02/06/2021   Non-intractable vomiting 02/06/2021    Arthritis of right knee    S/P knee replacement 01/06/2021   Morning headache 06/02/2017   Leiomyoma of body of uterus 12/24/2016   Prediabetes 11/11/2016   Pap smear abnormality of cervix with ASCUS favoring benign 01/01/2016   COPD (chronic obstructive pulmonary disease) (HCC) 11/18/2015   GERD (gastroesophageal reflux disease) 11/18/2015   Abnormal imaging of thyroid 11/04/2015   COPD exacerbation (HCC) 11/01/2015   Elevated d-dimer 11/01/2015   IDA (iron deficiency anemia) 09/04/2015   Patellofemoral disorder 04/04/2015   Insomnia 04/09/2014   S/P cervical spinal fusion 03/06/2014   Class 2 severe obesity due to excess calories with serious comorbidity and body mass index (BMI) of 37.0 to 37.9 in adult (HCC) 02/22/2014   Radicular pain in right arm 01/11/2014   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   Other and unspecified hyperlipidemia 08/24/2013   History of CVA (cerebrovascular accident) 05/18/2013   Tobacco abuse, 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     Current Outpatient Medications on File Prior to Visit  Medication Sig Dispense Refill   amLODipine (NORVASC) 5 MG tablet Take 1 tablet (5 mg total) by mouth daily. 90 tablet 0   hydrOXYzine (VISTARIL) 25 MG capsule Take 1 capsule (25 mg total) by mouth every 8 (eight) hours as needed. 30 capsule 2   SUMAtriptan (IMITREX) 25 MG tablet Take 25 mg (1 tablet total) by mouth at the start of the headache. May repeat in 2 hours x 1 if headache persists. Max of 2 tablets/24 hours. 30 tablet 1   albuterol (VENTOLIN HFA) 108 (90 Base) MCG/ACT inhaler Inhale 2 puffs into the lungs every 6 (six) hours as needed for wheezing or shortness of breath. 8  g 5   ferrous sulfate 325 (65 FE) MG tablet Take 1 tablet (325 mg total) by mouth every other day. (Patient not taking: Reported on 01/04/2024) 90 tablet 0   fluconazole (DIFLUCAN) 150 MG tablet Take 1 tablet (150 mg total) by mouth daily. Take at first sign of vaginal yeast (Patient not taking:  Reported on 01/04/2024) 1 tablet 0   fluticasone-salmeterol (ADVAIR DISKUS) 250-50 MCG/ACT AEPB Inhale 1 puff into the lungs in the morning and at bedtime. 60 each 5   gabapentin (NEURONTIN) 100 MG capsule Take 1 capsule (100 mg total) by mouth 4 (four) times daily. (Patient not taking: Reported on 01/04/2024) 120 capsule 0   promethazine-dextromethorphan (PROMETHAZINE-DM) 6.25-15 MG/5ML syrup Take 5 mLs by mouth 4 (four) times daily as needed for cough. (Patient not taking: Reported on 01/04/2024) 118 mL 0   Tiotropium Bromide Monohydrate (SPIRIVA RESPIMAT) 2.5 MCG/ACT AERS Inhale 2 puffs into the lungs daily. 4 g 5   traMADol (ULTRAM) 50 MG tablet Take 1 tablet (50 mg total) by mouth every 8 (eight) hours as needed for moderate pain or severe pain. (Patient not taking: Reported on 01/04/2024) 15 tablet 0   No current facility-administered medications on file prior to visit.    Allergies  Allergen Reactions   Shellfish Allergy Itching and Swelling   Latex Itching and Rash    Social History   Socioeconomic History   Marital status: Divorced    Spouse name: Not on file   Number of children: 7   Years of education: GEd   Highest education level: Not on file  Occupational History   Occupation: CNA    Employer: [employer]  Tobacco Use   Smoking status: Former    Current packs/day: 0.00    Average packs/day: 0.5 packs/day for 26.0 years (13.0 ttl pk-yrs)    Types: Cigarettes    Start date: 10/08/1989    Quit date: 10/09/2015    Years since quitting: 8.2    Passive exposure: Past   Smokeless tobacco: Former  Building services engineer status: Never Used  Substance and Sexual Activity   Alcohol use: Yes    Comment: occasionally   Drug use: No   Sexual activity: Not Currently    Birth control/protection: Surgical  Other Topics Concern   Not on file  Social History Narrative   Patient lives at home with her family    She has 7 children   5 grown and out of the house   2 at home 37  and 14   She works as a Lawyer at [employer]    Social Drivers of Corporate investment banker Strain: Not on [employer] Insecurity: Not on file  Transportation Needs: Not on file  Physical Activity: Not on file  Stress: Not on file  Social Connections: Not on file  Intimate Partner Violence: Not on file    Family History  Problem Relation Age of Onset   Renal Disease Father        dialysis   Hypertension Father    Heart disease Mother    Heart disease Maternal Grandfather    Asthma Sister    Asthma Maternal Aunt     Past Surgical History:  Procedure Laterality Date   ANTERIOR CERVICAL DECOMP/DISCECTOMY FUSION N/A 03/06/2014   Procedure: ANTERIOR CERVICAL DECOMPRESSION/DISCECTOMY FUSION 2 LEVELS four/five, five/six;  Surgeon: Tia Alert, MD;  Location[facility] NEURO ORS;  Service: Neurosurgery;  Laterality: N/A;   CESAREAN SECTION  x 1 with 5th pregnancy   CHOLECYSTECTOMY     KNEE ARTHROSCOPY Right 2015   TEE WITHOUT CARDIOVERSION N/A 05/21/2013   Procedure: TRANSESOPHAGEAL ECHOCARDIOGRAM (TEE);  Surgeon: Quintella Reichert, MD;  Location: [facility] ENDOSCOPY;  Service: Cardiovascular;  Laterality: N/A;   TOTAL KNEE ARTHROPLASTY Right 01/06/2021   TOTAL KNEE ARTHROPLASTY Right 01/06/2021   Procedure: RIGHT TOTAL KNEE ARTHROPLASTY;  Surgeon: Cammy Copa, MD;  [facility];  Service: Orthopedics;  Laterality: Right;   TUBAL LIGATION      ROS: Review of Systems Negative except as stated above  PHYSICAL EXAM: BP 114/81   Pulse 89   Temp 98 F (36.7 C) (Oral)   Ht 5' 3.5" (1.613 m)   Wt 179 lb 3.2 oz (81.3 kg)   SpO2 93%   BMI 31.25 kg/m   Physical Exam HENT:     Head: Normocephalic and atraumatic.     Nose: Nose normal.     Mouth/Throat:     Mouth: Mucous membranes are moist.     Pharynx: Oropharynx is clear.  Eyes:     Extraocular Movements: Extraocular movements intact.     Conjunctiva/sclera: Conjunctivae normal.     Pupils: Pupils are equal, round, and  reactive to light.  Cardiovascular:     Rate and Rhythm: Normal rate and regular rhythm.     Pulses: Normal pulses.     Heart sounds: Normal heart sounds.  Pulmonary:     Effort: Pulmonary effort is normal.     Breath sounds: Normal breath sounds.  Musculoskeletal:        General: Normal range of motion.     Cervical back: Normal range of motion and neck supple.  Neurological:     General: No focal deficit present.     Mental Status: She is alert and oriented to person, place, and time.  Psychiatric:        Mood and Affect: Mood normal.        Behavior: Behavior normal.    ASSESSMENT AND PLAN: 1. Primary hypertension (Primary) - Continue Amlodipine as prescribed.  - Routine screening.  - Counseled on blood pressure goal of less than 130/80, low-sodium, DASH diet, medication compliance, and 150 minutes of moderate intensity exercise per week as tolerated. Counseled on medication adherence and adverse effects. - Follow-up with primary provider in 3 months or sooner if needed. - Basic Metabolic Panel - amLODipine (NORVASC) 5 MG tablet; Take 1 tablet (5 mg total) by mouth daily.  Dispense: 90 tablet; Refill: 0  2. Type 2 diabetes mellitus with hyperglycemia, without long-term current use of insulin (HCC) - Continue Dulaglutide as prescribed. Counseled on medication adherence/adverse effects. - Hemoglobin A1c result pending.  - Routine screening.  - Discussed the importance of healthy eating habits, low-carbohydrate diet, low-sugar diet, regular aerobic exercise (at least 150 minutes a week as tolerated) and medication compliance to achieve or maintain control of diabetes. - Follow-up with primary provider as scheduled. - Microalbumin / creatinine urine ratio - POCT glycosylated hemoglobin (Hb A1C) - Dulaglutide (TRULICITY) 0.75 MG/0.5ML SOAJ; Inject 0.75 mg into the skin once a week.  Dispense: 6 mL; Refill: 0  3. Diabetic eye exam [facility]) - Referral to Ophthalmology for  evaluation/management. - Ambulatory referral to Ophthalmology  4. Encounter for diabetic foot exam [facility]) - Referral to Podiatry for evaluation/management. - Ambulatory referral to Podiatry  5. Hyperlipidemia, unspecified hyperlipidemia type - Continue Atorvastatin as prescribed. Counseled on medication adherence/adverse effects. - Follow-up with primary provider  as scheduled. - atorvastatin (LIPITOR) 20 MG tablet; Take 1 tablet (20 mg total) by mouth daily.  Dispense: 90 tablet; Refill: 0  6. Gastroesophageal reflux disease, unspecified whether esophagitis present - Continue Omeprazole as prescribed. Counseled on medication adherence/adverse effects. - Follow-up with primary provider as scheduled. - omeprazole (PRILOSEC) 40 MG capsule; Take 1 capsule (40 mg total) by mouth daily.  Dispense: 90 capsule; Refill: 0  7. Generalized anxiety disorder - Patient denies thoughts of self-harm, suicidal ideations, homicidal ideations. - Continue Citalopram as prescribed. Counseled on medication adherence/adverse effects. - Increase Hydroxyzine from 25 mg to 50 mg as prescribed. Counseled on medication adherence/adverse effects. - Referral to Psychiatry for evaluation/management.  - Follow-up with primary provider in 4 weeks or sooner if needed.  - citalopram (CELEXA) 10 MG tablet; Take 1 tablet (10 mg total) by mouth daily.  Dispense: 90 tablet; Refill: 0 - hydrOXYzine (ATARAX) 50 MG tablet; Take 1 tablet (50 mg total) by mouth 3 (three) times daily as needed.  Dispense: 30 tablet; Refill: 1 - Ambulatory referral to Psychiatry  8. Insomnia, unspecified type - Patient declined pharmacological therapy.  - Patient declined referral to specialist.   9. Bronchitis - Referral to Pulmonology for evaluation/management. - Ambulatory referral to Pulmonology    Patient was given the opportunity to ask questions.  Patient verbalized understanding of the plan and was able to repeat key elements of the  plan. Patient was given clear instructions to go to Emergency Department or return to medical center if symptoms don't improve, worsen, or new problems develop.The patient verbalized understanding.   Orders Placed This Encounter  Procedures   Microalbumin / creatinine urine ratio   Basic Metabolic Panel   Ambulatory referral to Podiatry   Ambulatory referral to Ophthalmology   Ambulatory referral to Pulmonology   Ambulatory referral to Psychiatry   POCT glycosylated hemoglobin (Hb A1C)     Requested Prescriptions   Signed Prescriptions Disp Refills   amLODipine (NORVASC) 5 MG tablet 90 tablet 0    Sig: Take 1 tablet (5 mg total) by mouth daily.   Dulaglutide (TRULICITY) 0.75 MG/0.5ML SOAJ 6 mL 0    Sig: Inject 0.75 mg into the skin once a week.   atorvastatin (LIPITOR) 20 MG tablet 90 tablet 0    Sig: Take 1 tablet (20 mg total) by mouth daily.   citalopram (CELEXA) 10 MG tablet 90 tablet 0    Sig: Take 1 tablet (10 mg total) by mouth daily.   hydrOXYzine (ATARAX) 50 MG tablet 30 tablet 1    Sig: Take 1 tablet (50 mg total) by mouth 3 (three) times daily as needed.   omeprazole (PRILOSEC) 40 MG capsule 90 capsule 0    Sig: Take 1 capsule (40 mg total) by mouth daily.    Return in about 3 months (around 04/02/2024) for Follow-Up or next available chronic conditions.  Rema Fendt, NP

## 2024-01-05 ENCOUNTER — Encounter: Payer: Self-pay | Admitting: Family

## 2024-01-05 LAB — BASIC METABOLIC PANEL
BUN/Creatinine Ratio: 15 (ref 9–23)
BUN: 10 mg/dL (ref 6–24)
CO2: 23 mmol/L (ref 20–29)
Calcium: 8.9 mg/dL (ref 8.7–10.2)
Chloride: 102 mmol/L (ref 96–106)
Creatinine, Ser: 0.66 mg/dL (ref 0.57–1.00)
Glucose: 112 mg/dL — ABNORMAL HIGH (ref 70–99)
Potassium: 3.4 mmol/L — ABNORMAL LOW (ref 3.5–5.2)
Sodium: 140 mmol/L (ref 134–144)
eGFR: 104 mL/min/{1.73_m2} (ref >59)

## 2024-01-05 LAB — MICROALBUMIN / CREATININE URINE RATIO
Creatinine, Urine: 286.6 mg/dL
Microalb/Creat Ratio: 6 mg/g{creat} (ref 0–29)
Microalbumin, Urine: 16.2 ug/mL

## 2024-01-16 ENCOUNTER — Telehealth (INDEPENDENT_AMBULATORY_CARE_PROVIDER_SITE_OTHER): Payer: Self-pay

## 2024-01-16 NOTE — Telephone Encounter (Signed)
 Reason for CRM: Patient concerned as she has not heard anything about her A1c. Patient states she was on trulicity before but not on any medication, patient is also concerned about this.            Patient requesting callback, [phone]     Can you get patient on schedule to have this checked again, I see a future order but not a recent lab.

## 2024-01-16 NOTE — Telephone Encounter (Signed)
 Pt stated she is not coming back to get A1C checked because she came to last OV for that specific reason and she had taken off from work. I advised pt that her A1C was not resulted in the last visit and that she will just have to come in for a nurse visit. Pt declined. Please advise

## 2024-01-17 ENCOUNTER — Other Ambulatory Visit: Payer: Self-pay

## 2024-01-17 ENCOUNTER — Telehealth: Payer: Self-pay

## 2024-01-17 DIAGNOSIS — E1165 Type 2 diabetes mellitus with hyperglycemia: Secondary | ICD-10-CM

## 2024-01-17 LAB — POCT GLYCOSYLATED HEMOGLOBIN (HGB A1C): HbA1c, POC (controlled diabetic range): 6.3 % (ref 0.0–7.0)

## 2024-01-17 NOTE — Telephone Encounter (Signed)
 I spoke to patient, she is coming back in to have her A1c checked.

## 2024-01-17 NOTE — Telephone Encounter (Signed)
 Patient asked what she should be taking for her blood sugar? States she hasn't been on any medication for this.   Patient also has concerns with her potassium being low.

## 2024-01-18 ENCOUNTER — Encounter: Payer: Self-pay | Admitting: Family

## 2024-01-19 ENCOUNTER — Telehealth: Payer: Self-pay

## 2024-01-19 ENCOUNTER — Telehealth: Payer: Self-pay | Admitting: Pulmonary Disease

## 2024-01-19 ENCOUNTER — Other Ambulatory Visit: Payer: Self-pay

## 2024-01-19 NOTE — Telephone Encounter (Signed)
**Note De-identified  [place] Obfuscation** Please advise 

## 2024-01-19 NOTE — Telephone Encounter (Signed)
-   Prescribed Dulaglutide on 01/04/2024 for diabetes.  - Potassium mildly low at 3.4 mmol/L. Normal potassium 3.5 mmol/L.

## 2024-01-19 NOTE — Telephone Encounter (Signed)
 Refer to previous reply.

## 2024-01-19 NOTE — Telephone Encounter (Signed)
 Patient would like to switch providers to [facility] Location. States [facility] is too far. Needs appointment. Patient phone number is [phone].

## 2024-01-19 NOTE — Telephone Encounter (Signed)
 NA

## 2024-01-19 NOTE — Telephone Encounter (Signed)
 Multiple test need sent to [name] so she could help me navigate this. I informed patient, this was passed to [name], and that I will reach back out with more information on what we could do for next steps, tomorrow.

## 2024-01-19 NOTE — Telephone Encounter (Signed)
 Left voicemail for patient to call back to schedule

## 2024-01-24 ENCOUNTER — Ambulatory Visit (HOSPITAL_BASED_OUTPATIENT_CLINIC_OR_DEPARTMENT_OTHER): Payer: Self-pay | Admitting: Family

## 2024-01-24 VITALS — BP 124/78 | HR 77 | Ht 63.5 in | Wt 181.2 lb

## 2024-01-24 DIAGNOSIS — G479 Sleep disorder, unspecified: Secondary | ICD-10-CM

## 2024-01-24 DIAGNOSIS — F4323 Adjustment disorder with mixed anxiety and depressed mood: Secondary | ICD-10-CM

## 2024-01-24 MED ORDER — DOXEPIN HCL 50 MG PO CAPS
50.0000 mg | ORAL_CAPSULE | Freq: Every day | ORAL | 1 refills | Status: AC
Start: 2024-01-24 — End: 2025-01-23
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
## 2024-01-25 ENCOUNTER — Telehealth: Payer: Self-pay

## 2024-01-25 NOTE — Addendum Note (Signed)
 Addended by: Oneta Rack on: 01/25/2024 08:53 AM   Modules accepted: Level of Service

## 2024-01-25 NOTE — Telephone Encounter (Signed)
 [name], can you help me out with this? I looked through her chart and I couldn't see anything regarding this.     Dulaglutide (TRULICITY) 0.75 MG/0.5ML    Thank you.

## 2024-01-26 ENCOUNTER — Other Ambulatory Visit: Payer: Self-pay

## 2024-01-26 ENCOUNTER — Telehealth: Payer: Self-pay

## 2024-01-26 ENCOUNTER — Other Ambulatory Visit: Payer: Self-pay | Admitting: Family

## 2024-01-26 ENCOUNTER — Telehealth: Payer: Self-pay | Admitting: Family

## 2024-01-26 DIAGNOSIS — E1165 Type 2 diabetes mellitus with hyperglycemia: Secondary | ICD-10-CM

## 2024-01-26 NOTE — Telephone Encounter (Signed)
 Spoke to patient informed her the orders are in and she can head to [facility] and they can get these for her.

## 2024-01-26 NOTE — Telephone Encounter (Signed)
 We got required test ordered and I advised patient that [facility] can do this test since we dont offer some in office.

## 2024-01-26 NOTE — Telephone Encounter (Signed)
 I messaged my friend [name] who works at [employer] and she told me it's just called glucose tolerance test (GTT) I found the order and pend it.

## 2024-01-27 ENCOUNTER — Other Ambulatory Visit: Payer: Self-pay | Admitting: Family

## 2024-01-27 DIAGNOSIS — E1165 Type 2 diabetes mellitus with hyperglycemia: Secondary | ICD-10-CM

## 2024-01-27 NOTE — Telephone Encounter (Signed)
 Complete

## 2024-02-09 ENCOUNTER — Telehealth: Payer: Self-pay | Admitting: Physician Assistant

## 2024-02-09 DIAGNOSIS — J449 Chronic obstructive pulmonary disease, unspecified: Secondary | ICD-10-CM

## 2024-02-09 MED ORDER — FLUTICASONE-SALMETEROL 250-50 MCG/ACT IN AEPB: 1.0000 | INHALATION_SPRAY | Freq: Two times a day (BID) | RESPIRATORY_TRACT | 5 refills | Status: DC

## 2024-02-09 MED ORDER — ALBUTEROL SULFATE HFA 108 (90 BASE) MCG/ACT IN AERS: 2.0000 | INHALATION_SPRAY | Freq: Four times a day (QID) | RESPIRATORY_TRACT | 5 refills | Status: AC | PRN

## 2024-02-09 MED ORDER — PREDNISONE 20 MG PO TABS: 40.0000 mg | ORAL_TABLET | Freq: Every day | ORAL | 0 refills | Status: DC

## 2024-02-09 MED ORDER — BENZONATATE 100 MG PO CAPS: 100.0000 mg | ORAL_CAPSULE | Freq: Three times a day (TID) | ORAL | 0 refills | Status: DC | PRN

## 2024-02-09 NOTE — Progress Notes (Signed)
 Virtual Visit Consent   [name], you are scheduled for a virtual visit with a [facility] provider today. Just as with appointments in the office, your consent must be obtained to participate. Your consent will be active for this visit and any virtual visit you may have with one of our providers in the next 365 days. If you have a MyChart account, a copy of this consent can be sent to you electronically.  As this is a virtual visit, video technology does not allow for your provider to perform a traditional examination. This may limit your provider's ability to fully assess your condition. If your provider identifies any concerns that need to be evaluated in person or the need to arrange testing (such as labs, EKG, etc.), we will make arrangements to do so. Although advances in technology are sophisticated, we cannot ensure that it will always work on either your end or our end. If the connection with a video visit is poor, the visit may have to be switched to a telephone visit. With either a video or telephone visit, we are not always able to ensure that we have a secure connection.  By engaging in this virtual visit, you consent to the provision of healthcare and authorize for your insurance to be billed (if applicable) for the services provided during this visit. Depending on your insurance coverage, you may receive a charge related to this service.  I need to obtain your verbal consent now. Are you willing to proceed with your visit today? IRETA PULLMAN has provided verbal consent on 02/09/2024 for a virtual visit (video or telephone). Piedad Climes, [place]  Date: 02/09/2024 12:18 PM   Virtual Visit via Video Note   I, Piedad Climes, connected with  LISSET KETCHEM  ([id number], [birth year]) on 02/09/24 at 12:15 PM EDT by a video-enabled telemedicine application and verified that I am speaking with the correct person using two identifiers.  Location: Patient: Home Provider: Virtual  Visit Location Provider: Home Office   I discussed the limitations of evaluation and management by telemedicine and the availability of in person appointments. The patient expressed understanding and agreed to proceed.    History of Present Illness: DJENEBA BARSCH is a 53 y.o. who identifies as a female who was assigned female at birth, and is being seen today for possible flare of her COPD. Patient endorses increased congestion with persistent cough, causing gagging and production of thick, white phlegm. Denies fever, chills. Notes some windedness with coughing spells. Denies chest pain or overt SOB. Has found old albuterol inhaler that she has been using as directed. Is out of her Advair.   OTC --   HPI: HPI  Problems:  Patient Active Problem List   Diagnosis Date Noted   Adjustment disorder with mixed anxiety and depressed mood 04/19/2023   Hyperlipidemia 11/16/2022   Diabetes mellitus, type 2 (HCC) 11/16/2022   Trochanteric bursitis, left hip 09/03/2022   Asthmatic bronchitis with acute exacerbation 04/09/2022   Chronic cough 01/27/2022   Right thyroid nodule 01/07/2022   Essential hypertension 01/05/2022   Migraine 01/05/2022   Thyroid nodule greater than or equal to 1.5 cm in diameter incidentally noted on imaging study 12/01/2021   Grief 02/06/2021   Non-intractable vomiting 02/06/2021   Arthritis of right knee    S/P knee replacement 01/06/2021   Morning headache 06/02/2017   Leiomyoma of body of uterus 12/24/2016   Prediabetes 11/11/2016   Pap smear abnormality of cervix with  ASCUS favoring benign 01/01/2016   COPD (chronic obstructive pulmonary disease) (HCC) 11/18/2015   GERD (gastroesophageal reflux disease) 11/18/2015   Abnormal imaging of thyroid 11/04/2015   COPD exacerbation (HCC) 11/01/2015   Elevated d-dimer 11/01/2015   IDA (iron deficiency anemia) 09/04/2015   Patellofemoral disorder 04/04/2015   Insomnia 04/09/2014   S/P cervical spinal fusion 03/06/2014    Class 2 severe obesity due to excess calories with serious comorbidity and body mass index (BMI) of 37.0 to 37.9 in adult [facility]) 02/22/2014   Radicular pain in right arm 01/11/2014   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   Other and unspecified hyperlipidemia 08/24/2013   History of CVA (cerebrovascular accident) 05/18/2013   Tobacco abuse, 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    Allergies:  Allergies  Allergen Reactions   Shellfish Allergy Itching and Swelling   Latex Itching and Rash   Medications:  Current Outpatient Medications:    benzonatate (TESSALON) 100 MG capsule, Take 1 capsule (100 mg total) by mouth 3 (three) times daily as needed for cough., Disp: 30 capsule, Rfl: 0   predniSONE (DELTASONE) 20 MG tablet, Take 2 tablets (40 mg total) by mouth daily with breakfast., Disp: 10 tablet, Rfl: 0   albuterol (VENTOLIN HFA) 108 (90 Base) MCG/ACT inhaler, Inhale 2 puffs into the lungs every 6 (six) hours as needed for wheezing or shortness of breath., Disp: 8 g, Rfl: 5   amLODipine (NORVASC) 5 MG tablet, Take 1 tablet (5 mg total) by mouth daily., Disp: 90 tablet, Rfl: 0   amLODipine (NORVASC) 5 MG tablet, Take 1 tablet (5 mg total) by mouth daily., Disp: 90 tablet, Rfl: 0   atorvastatin (LIPITOR) 20 MG tablet, Take 1 tablet (20 mg total) by mouth daily., Disp: 90 tablet, Rfl: 0   citalopram (CELEXA) 10 MG tablet, Take 1 tablet (10 mg total) by mouth daily., Disp: 90 tablet, Rfl: 0   doxepin (SINEQUAN) 50 MG capsule, Take 1 capsule (50 mg total) by mouth at bedtime., Disp: 30 capsule, Rfl: 1   Dulaglutide (TRULICITY) 0.75 MG/0.5ML SOAJ, Inject 0.75 mg into the skin once a week., Disp: 6 mL, Rfl: 0   ferrous sulfate 325 (65 FE) MG tablet, Take 1 tablet (325 mg total) by mouth every other day. (Patient not taking: Reported on 01/04/2024), Disp: 90 tablet, Rfl: 0   fluconazole (DIFLUCAN) 150 MG tablet, Take 1 tablet (150 mg total) by mouth daily. Take at first sign of vaginal yeast (Patient not  taking: Reported on 01/04/2024), Disp: 1 tablet, Rfl: 0   fluticasone-salmeterol (ADVAIR DISKUS) 250-50 MCG/ACT AEPB, Inhale 1 puff into the lungs in the morning and at bedtime., Disp: 60 each, Rfl: 5   gabapentin (NEURONTIN) 100 MG capsule, Take 1 capsule (100 mg total) by mouth 4 (four) times daily. (Patient not taking: Reported on 01/04/2024), Disp: 120 capsule, Rfl: 0   hydrOXYzine (ATARAX) 50 MG tablet, Take 1 tablet (50 mg total) by mouth 3 (three) times daily as needed., Disp: 30 tablet, Rfl: 1   hydrOXYzine (VISTARIL) 25 MG capsule, Take 1 capsule (25 mg total) by mouth every 8 (eight) hours as needed., Disp: 30 capsule, Rfl: 2   omeprazole (PRILOSEC) 40 MG capsule, Take 1 capsule (40 mg total) by mouth daily., Disp: 90 capsule, Rfl: 0   promethazine-dextromethorphan (PROMETHAZINE-DM) 6.25-15 MG/5ML syrup, Take 5 mLs by mouth 4 (four) times daily as needed for cough. (Patient not taking: Reported on 01/04/2024), Disp: 118 mL, Rfl: 0   SUMAtriptan (IMITREX) 25 MG tablet,  Take 25 mg (1 tablet total) by mouth at the start of the headache. May repeat in 2 hours x 1 if headache persists. Max of 2 tablets/24 hours., Disp: 30 tablet, Rfl: 1   Tiotropium Bromide Monohydrate (SPIRIVA RESPIMAT) 2.5 MCG/ACT AERS, Inhale 2 puffs into the lungs daily., Disp: 4 g, Rfl: 5   traMADol (ULTRAM) 50 MG tablet, Take 1 tablet (50 mg total) by mouth every 8 (eight) hours as needed for moderate pain or severe pain. (Patient not taking: Reported on 01/04/2024), Disp: 15 tablet, Rfl: 0  Observations/Objective: Patient is well-developed, well-nourished in no acute distress.  Resting comfortably  at home.  Head is normocephalic, atraumatic.  No labored breathing.  Speech is clear and coherent with logical content.  Patient is alert and oriented at baseline.   Assessment and Plan: 1. Chronic obstructive pulmonary disease, unspecified COPD type (HCC) - predniSONE (DELTASONE) 20 MG tablet; Take 2 tablets (40 mg total) by  mouth daily with breakfast.  Dispense: 10 tablet; Refill: 0 - benzonatate (TESSALON) 100 MG capsule; Take 1 capsule (100 mg total) by mouth 3 (three) times daily as needed for cough.  Dispense: 30 capsule; Refill: 0 - albuterol (VENTOLIN HFA) 108 (90 Base) MCG/ACT inhaler; Inhale 2 puffs into the lungs every 6 (six) hours as needed for wheezing or shortness of breath.  Dispense: 8 g; Refill: 5 - fluticasone-salmeterol (ADVAIR DISKUS) 250-50 MCG/ACT AEPB; Inhale 1 puff into the lungs in the morning and at bedtime.  Dispense: 60 each; Refill: 5  COPD with mild exacerbation.  Supportive measures and OTC medications reviewed.  Refilled her albuterol.  Advair refilled for her to restart.  Tessalon and prednisone per orders.  Since no change in sputum, suspect this is a flare related to allergic inflammation versus an acute bacterial bronchitis on top of her COPD.  Signs and symptoms of active infection reviewed.  She is aware when to call us or her PCP for reassessment.  Work note provided.  Follow Up Instructions: I discussed the assessment and treatment plan with the patient. The patient was provided an opportunity to ask questions and all were answered. The patient agreed with the plan and demonstrated an understanding of the instructions.  A copy of instructions were sent to the patient via MyChart unless otherwise noted below.   The patient was advised to call back or seek an in-person evaluation if the symptoms worsen or if the condition fails to improve as anticipated.    Piedad Climes, PA-C

## 2024-02-09 NOTE — Patient Instructions (Signed)
 [name], thank you for joining Piedad Climes, PA-C for today's virtual visit.  While this provider is not your primary care provider (PCP), if your PCP is located in our provider database this encounter information will be shared with them immediately following your visit.   A [facility] MyChart account gives you access to today's visit and all your visits, tests, and labs performed at [facility] " click here if you don't have a [facility] MyChart account or go to [email or web address]  Consent: (Patient) [name] provided verbal consent for this virtual visit at the beginning of the encounter.  Current Medications:  Current Outpatient Medications:    albuterol (VENTOLIN HFA) 108 (90 Base) MCG/ACT inhaler, Inhale 2 puffs into the lungs every 6 (six) hours as needed for wheezing or shortness of breath., Disp: 8 g, Rfl: 5   amLODipine (NORVASC) 5 MG tablet, Take 1 tablet (5 mg total) by mouth daily., Disp: 90 tablet, Rfl: 0   amLODipine (NORVASC) 5 MG tablet, Take 1 tablet (5 mg total) by mouth daily., Disp: 90 tablet, Rfl: 0   atorvastatin (LIPITOR) 20 MG tablet, Take 1 tablet (20 mg total) by mouth daily., Disp: 90 tablet, Rfl: 0   citalopram (CELEXA) 10 MG tablet, Take 1 tablet (10 mg total) by mouth daily., Disp: 90 tablet, Rfl: 0   doxepin (SINEQUAN) 50 MG capsule, Take 1 capsule (50 mg total) by mouth at bedtime., Disp: 30 capsule, Rfl: 1   Dulaglutide (TRULICITY) 0.75 MG/0.5ML SOAJ, Inject 0.75 mg into the skin once a week., Disp: 6 mL, Rfl: 0   ferrous sulfate 325 (65 FE) MG tablet, Take 1 tablet (325 mg total) by mouth every other day. (Patient not taking: Reported on 01/04/2024), Disp: 90 tablet, Rfl: 0   fluconazole (DIFLUCAN) 150 MG tablet, Take 1 tablet (150 mg total) by mouth daily. Take at first sign of vaginal yeast (Patient not taking: Reported on 01/04/2024), Disp: 1 tablet, Rfl: 0   fluticasone-salmeterol (ADVAIR DISKUS) 250-50 MCG/ACT AEPB,  Inhale 1 puff into the lungs in the morning and at bedtime., Disp: 60 each, Rfl: 5   gabapentin (NEURONTIN) 100 MG capsule, Take 1 capsule (100 mg total) by mouth 4 (four) times daily. (Patient not taking: Reported on 01/04/2024), Disp: 120 capsule, Rfl: 0   hydrOXYzine (ATARAX) 50 MG tablet, Take 1 tablet (50 mg total) by mouth 3 (three) times daily as needed., Disp: 30 tablet, Rfl: 1   hydrOXYzine (VISTARIL) 25 MG capsule, Take 1 capsule (25 mg total) by mouth every 8 (eight) hours as needed., Disp: 30 capsule, Rfl: 2   omeprazole (PRILOSEC) 40 MG capsule, Take 1 capsule (40 mg total) by mouth daily., Disp: 90 capsule, Rfl: 0   promethazine-dextromethorphan (PROMETHAZINE-DM) 6.25-15 MG/5ML syrup, Take 5 mLs by mouth 4 (four) times daily as needed for cough. (Patient not taking: Reported on 01/04/2024), Disp: 118 mL, Rfl: 0   SUMAtriptan (IMITREX) 25 MG tablet, Take 25 mg (1 tablet total) by mouth at the start of the headache. May repeat in 2 hours x 1 if headache persists. Max of 2 tablets/24 hours., Disp: 30 tablet, Rfl: 1   Tiotropium Bromide Monohydrate (SPIRIVA RESPIMAT) 2.5 MCG/ACT AERS, Inhale 2 puffs into the lungs daily., Disp: 4 g, Rfl: 5   traMADol (ULTRAM) 50 MG tablet, Take 1 tablet (50 mg total) by mouth every 8 (eight) hours as needed for moderate pain or severe pain. (Patient not taking: Reported on 01/04/2024), Disp: 15 tablet, Rfl: 0  Medications ordered in this encounter:  No orders of the defined types were placed in this encounter.    *If you need refills on other medications prior to your next appointment, please contact your pharmacy*  Follow-Up: Call back or seek an in-person evaluation if the symptoms worsen or if the condition fails to improve as anticipated.  [facility] [phone]  Other Instructions Please restart your Advair, taking as directed. I have sent in a refill of your albuterol inhaler. Start the prednisone taking as directed, and use the  Tessalon to help with cough. Over-the-counter plain Mucinex will be beneficial to help thin out your congestion. You need to make sure to schedule follow-up with your pulmonologist for ongoing management. If symptoms are not coming down, or you note any new or worsening symptoms, please seek an in person evaluation ASAP.   If you have been instructed to have an in-person evaluation today at a local Urgent Care facility, please use the link below. It will take you to a list of all of our available [facility], including address, phone number and hours of operation. Please do not delay care.  [facility]  If you or a family member do not have a primary care provider, use the link below to schedule a visit and establish care. When you choose a [place] [facility] primary care physician or advanced practice provider, you gain a long-term partner in health. Find a Primary Care Provider  Learn more about [facility]'s in-office and virtual care options:  - Get Care Now

## 2024-02-15 ENCOUNTER — Encounter: Payer: Self-pay | Admitting: Family

## 2024-02-15 ENCOUNTER — Other Ambulatory Visit: Payer: Self-pay | Admitting: Family

## 2024-02-15 DIAGNOSIS — E1165 Type 2 diabetes mellitus with hyperglycemia: Secondary | ICD-10-CM

## 2024-02-15 MED ORDER — TRULICITY 0.75 MG/0.5ML ~~LOC~~ SOAJ
0.7500 mg | SUBCUTANEOUS | 0 refills | Status: DC
Start: 2024-02-15 — End: 2024-03-28

## 2024-02-15 NOTE — Telephone Encounter (Signed)
 Schedule appointment?

## 2024-02-16 NOTE — Telephone Encounter (Signed)
 Called patient no answer left voicemail to call office to schedule appointment

## 2024-02-20 ENCOUNTER — Other Ambulatory Visit: Payer: Self-pay | Admitting: Family

## 2024-02-20 DIAGNOSIS — F411 Generalized anxiety disorder: Secondary | ICD-10-CM

## 2024-03-26 ENCOUNTER — Ambulatory Visit (HOSPITAL_COMMUNITY): Admitting: Family

## 2024-03-28 ENCOUNTER — Ambulatory Visit: Payer: Self-pay | Admitting: Nurse Practitioner

## 2024-03-28 ENCOUNTER — Ambulatory Visit
Admission: EM | Admit: 2024-03-28 | Discharge: 2024-03-28 | Disposition: A | Discharge status: Home / Self Care | Payer: PRIVATE HEALTH INSURANCE | Attending: Nurse Practitioner | Admitting: Nurse Practitioner

## 2024-03-28 ENCOUNTER — Encounter: Payer: Self-pay | Admitting: Emergency Medicine

## 2024-03-28 ENCOUNTER — Ambulatory Visit (INDEPENDENT_AMBULATORY_CARE_PROVIDER_SITE_OTHER): Payer: PRIVATE HEALTH INSURANCE

## 2024-03-28 DIAGNOSIS — M79672 Pain in left foot: Secondary | ICD-10-CM

## 2024-03-28 NOTE — ED Triage Notes (Addendum)
 Pt reports L foot pain x 2-3 weeks. Pain was initially intermittent episodes but has become more constant. Pt reports dull pain in anterior portion of foot that radiates with sharp pain into her L calf. No trauma or injury to foot. No hx of gout, arthritis, or other recurrent histories. Taking tylenol  with no relief. Can move through ROM with some increase in pain.

## 2024-03-28 NOTE — ED Provider Notes (Signed)
 [facility]    CSN: [id number] Arrival date & time: 03/28/24  1133      History   Chief Complaint Chief Complaint  Patient presents with   Foot Pain    HPI [name] is a 53 y.o. female.   Patient is here requesting evaluation for left foot pain that has been intermittent for several weeks.  States she wore a pair of heels that did not work out 'and I had to take them right back off.'  States the pain is now radiating up into her foot.  No erythema, warmth, or skin changes.  No ankle discomfort.  Is able to walk independently.  States she has taken Tylenol  for the symptoms.  No reported injury or fall.    The history is provided by the patient.  Foot Pain Pertinent negatives include no chest pain, no abdominal pain, no headaches and no shortness of breath.    Past Medical History:  Diagnosis Date   Anemia    Anxiety    Arthritis    right knee, back   COPD (chronic obstructive pulmonary disease) (HCC)    GERD (gastroesophageal reflux disease)    Headache(784.0)    History of blood transfusion    Hx; of in 1991 after delivery   History of TIAs    Hypertension    Numbness    Right hand   Pneumonia    Stroke (HCC) 05/2013    Patient Active Problem List   Diagnosis Date Noted   Adjustment disorder with mixed anxiety and depressed mood 04/19/2023   Hyperlipidemia 11/16/2022   Diabetes mellitus, type 2 (HCC) 11/16/2022   Trochanteric bursitis, left hip 09/03/2022   Asthmatic bronchitis with acute exacerbation 04/09/2022   Chronic cough 01/27/2022   Right thyroid nodule 01/07/2022   Essential hypertension 01/05/2022   Migraine 01/05/2022   Thyroid nodule greater than or equal to 1.5 cm in diameter incidentally noted on imaging study 12/01/2021   Grief 02/06/2021   Non-intractable vomiting 02/06/2021   Arthritis of right knee    S/P knee replacement 01/06/2021   Leiomyoma of body of uterus 12/24/2016   Prediabetes 11/11/2016   Pap smear  abnormality of cervix with ASCUS favoring benign 01/01/2016   COPD (chronic obstructive pulmonary disease) (HCC) 11/18/2015   GERD (gastroesophageal reflux disease) 11/18/2015   Abnormal imaging of thyroid 11/04/2015   COPD exacerbation (HCC) 11/01/2015   Elevated d-dimer 11/01/2015   IDA (iron  deficiency anemia) 09/04/2015   Patellofemoral disorder 04/04/2015   Headache disorder 07/30/2014   Insomnia 04/09/2014   Cervical spondylosis with radiculopathy 03/25/2014   S/P cervical spinal fusion 03/06/2014   Class 2 severe obesity due to excess calories with serious comorbidity and body mass index (BMI) of 37.0 to 37.9 in adult (HCC) 02/22/2014   Brachial radiculitis 01/29/2014   Radicular pain in right arm 01/11/2014   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   Other and unspecified hyperlipidemia 08/24/2013   History of CVA (cerebrovascular accident) 05/18/2013   Tobacco abuse, 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    Past Surgical History:  Procedure Laterality Date   ANTERIOR CERVICAL DECOMP/DISCECTOMY FUSION N/A 03/06/2014   Procedure: ANTERIOR CERVICAL DECOMPRESSION/DISCECTOMY FUSION 2 LEVELS four/five, five/six;  Surgeon: Isadora Mar, MD;  Location[facility] NEURO [facility]S;  Service: Neurosurgery;  Laterality: N/A;   CESAREAN SECTION     x 1 with 5th pregnancy   CHOLECYSTECTOMY     KNEE ARTHROSCOPY Right 2015   TEE WITHOUT CARDIOVERSION N/A 05/21/2013  Procedure: TRANSESOPHAGEAL ECHOCARDIOGRAM (TEE);  Surgeon: Jacqueline Matsu, MD;  Location: [facility] ENDOSCOPY;  Service: Cardiovascular;  Laterality: N/A;   TOTAL KNEE ARTHROPLASTY Right 01/06/2021   TOTAL KNEE ARTHROPLASTY Right 01/06/2021   Procedure: RIGHT TOTAL KNEE ARTHROPLASTY;  Surgeon: Jasmine Mesi, MD;  Location: [facility] OR;  Service: Orthopedics;  Laterality: Right;   TUBAL LIGATION      OB History   No obstetric history on file.      Home Medications    Prior to Admission medications   Medication Sig Start Date End Date Taking?  Authorizing Provider  albuterol  (VENTOLIN  HFA) 108 (90 Base) MCG/ACT inhaler Inhale 2 puffs into the lungs every 6 (six) hours as needed for wheezing or shortness of breath. 02/09/24  Yes Farris Hong, PA-C  amLODipine  (NORVASC ) 5 MG tablet Take 1 tablet (5 mg total) by mouth daily. 09/19/23  Yes Rogerio Clay, Amy J, NP  atorvastatin  (LIPITOR) 20 MG tablet Take 1 tablet (20 mg total) by mouth daily. 01/04/24  Yes Rogerio Clay, Amy J, NP  citalopram  (CELEXA ) 10 MG tablet Take 1 tablet (10 mg total) by mouth daily. 01/04/24 04/03/24 Yes Rogerio Clay, Amy J, NP  doxepin  (SINEQUAN ) 50 MG capsule Take 1 capsule (50 mg total) by mouth at bedtime. 01/24/24 01/23/25 Yes Levester Reagin, NP  gabapentin  (NEURONTIN ) 100 MG capsule Take 1 capsule (100 mg total) by mouth 4 (four) times daily. 06/03/22  Yes Rogerio Clay, Amy J, NP  omeprazole  (PRILOSEC) 40 MG capsule Take 1 capsule (40 mg total) by mouth daily. 01/04/24  Yes Rogerio Clay, Amy J, NP  amLODipine  (NORVASC ) 5 MG tablet Take 1 tablet (5 mg total) by mouth daily. 01/04/24 04/03/24  Senaida Dama, NP    Family History Family History  Problem Relation Age of Onset   Renal Disease Father        dialysis   Hypertension Father    Heart disease Mother    Heart disease Maternal Grandfather    Asthma Sister    Asthma Maternal Aunt     Social History Social History   Tobacco Use   Smoking status: Former    Current packs/day: 0.00    Average packs/day: 0.5 packs/day for 26.0 years (13.0 ttl pk-yrs)    Types: Cigarettes    Start date: 10/08/1989    Quit date: 10/09/2015    Years since quitting: 8.4    Passive exposure: Past   Smokeless tobacco: Former  Building services engineer status: Never Used  Substance Use Topics   Alcohol  use: Yes    Comment: occasionally   Drug use: No     Allergies   Shellfish allergy  and Latex   Review of Systems Review of Systems  Constitutional:  Negative for appetite change and fever.  HENT:  Negative for congestion, rhinorrhea and  sore throat.   Eyes:  Negative for pain and redness.  Respiratory:  Negative for cough, chest tightness and shortness of breath.   Cardiovascular:  Negative for chest pain and palpitations.  Gastrointestinal:  Negative for abdominal pain, diarrhea and vomiting.  Genitourinary:  Negative for dysuria.  Musculoskeletal:  Positive for arthralgias. Negative for myalgias.  Skin:  Negative for rash.  Neurological:  Negative for dizziness and headaches.   Physical Exam Triage Vital Signs ED Triage Vitals  Encounter Vitals Group     BP 03/28/24 1214 130/86     Systolic BP Percentile --      Diastolic BP Percentile --      Pulse  Rate 03/28/24 1214 83     Resp 03/28/24 1214 18     Temp 03/28/24 1214 98.5 F (36.9 C)     Temp Source 03/28/24 1214 Oral     SpO2 03/28/24 1214 96 %     Weight --      Height --      Head Circumference --      Peak Flow --      Pain Score 03/28/24 1215 5     Pain Loc --      Pain Education --      Exclude from Growth Chart --    No data found.  Updated Vital Signs BP 130/86 (BP Location: Left Arm)   Pulse 83   Temp 98.5 F (36.9 C) (Oral)   Resp 18   LMP 11/14/2023 (Approximate)   SpO2 96%   Physical Exam Vitals and nursing note reviewed.  Constitutional:      Appearance: Normal appearance.  HENT:     Head: Normocephalic.  Cardiovascular:     Rate and Rhythm: Regular rhythm.     Heart sounds: Normal heart sounds.  Pulmonary:     Effort: Pulmonary effort is normal.     Breath sounds: Normal breath sounds.  Abdominal:     General: Bowel sounds are normal.  Musculoskeletal:     Left foot: Tenderness and bony tenderness present. Normal pulse.     Comments: Discomfort to the medial aspect of the dorsal forefoot - no pain with palpation of all five toes.  No external skin changes such as abrasions.  Maintains strong range of motion such as dorsi/plantar flexion.  No pain over the medial/lateral malleolus.  Neurovascular status is intact.     Skin:    General: Skin is warm and dry.  Neurological:     General: No focal deficit present.     Mental Status: She is alert and oriented to person, place, and time.  Psychiatric:        Mood and Affect: Mood normal.        Behavior: Behavior normal.        Thought Content: Thought content normal.        Judgment: Judgment normal.    UC Treatments / Results  Labs (all labs ordered are listed, but only abnormal results are displayed) Labs Reviewed - No data to display  EKG  Radiology No results found.  Procedures Procedures (including critical care time)  Medications Ordered in UC Medications - No data to display  Initial Impression / Assessment and Plan / UC Course  I have reviewed the triage vital signs and the nursing notes.  Pertinent labs & imaging results that were available during my care of the patient were reviewed by me and considered in my medical decision making (see chart for details).    Patient presenting for evaluation of left foot pain that began after wearing a pair of heels several weeks ago.  It has been intermittent up until recently where it became more pronounced. She has tried Tylenol  without any resolution of the discomfort. X-ray obtained and currently pending radiology interpretation.  Discussed RICE precautions and scheduled interval dosing of ibuprofen /Tylenol  for 3 days. ACE wrap applied for support.    Final Clinical Impressions(s) / UC Diagnoses   Final diagnoses:  Acute pain of left foot     Discharge Instructions      Scheduled ibuprofen /tylenol  for the next 3 days.  Elevated your foot, ice, and limit weight bearing when  able. Follow up with your PCP/Orthopedics if the discomfort persists.  ED Prescriptions   None    PDMP not reviewed this encounter.   Genene Kennel, FNP 03/28/24 [phone]

## 2024-03-28 NOTE — Discharge Instructions (Addendum)
 Scheduled ibuprofen /tylenol  for the next 3 days.  Elevated your foot, ice, and limit weight bearing when able. Follow up with your PCP/Orthopedics if the discomfort persists.

## 2024-04-04 ENCOUNTER — Encounter: Payer: 59 | Admitting: Family

## 2024-04-04 NOTE — Progress Notes (Signed)
 Erroneous encounter-disregard

## 2024-05-02 ENCOUNTER — Ambulatory Visit (INDEPENDENT_AMBULATORY_CARE_PROVIDER_SITE_OTHER): Payer: PRIVATE HEALTH INSURANCE | Admitting: Family

## 2024-05-02 ENCOUNTER — Ambulatory Visit: Payer: Self-pay | Admitting: Family

## 2024-05-02 ENCOUNTER — Encounter: Payer: Self-pay | Admitting: Family

## 2024-05-02 VITALS — BP 139/85 | HR 66 | Temp 98.5°F | Resp 16 | Ht 63.5 in | Wt 180.8 lb

## 2024-05-02 DIAGNOSIS — Z7985 Long-term (current) use of injectable non-insulin antidiabetic drugs: Secondary | ICD-10-CM | POA: Diagnosis not present

## 2024-05-02 DIAGNOSIS — F411 Generalized anxiety disorder: Secondary | ICD-10-CM

## 2024-05-02 DIAGNOSIS — E119 Type 2 diabetes mellitus without complications: Secondary | ICD-10-CM

## 2024-05-02 DIAGNOSIS — I10 Essential (primary) hypertension: Secondary | ICD-10-CM | POA: Diagnosis not present

## 2024-05-02 DIAGNOSIS — E785 Hyperlipidemia, unspecified: Secondary | ICD-10-CM | POA: Diagnosis not present

## 2024-05-02 DIAGNOSIS — E1165 Type 2 diabetes mellitus with hyperglycemia: Secondary | ICD-10-CM

## 2024-05-02 DIAGNOSIS — M7750 Other enthesopathy of unspecified foot: Secondary | ICD-10-CM

## 2024-05-02 DIAGNOSIS — K219 Gastro-esophageal reflux disease without esophagitis: Secondary | ICD-10-CM

## 2024-05-02 DIAGNOSIS — R053 Chronic cough: Secondary | ICD-10-CM

## 2024-05-02 LAB — POCT GLYCOSYLATED HEMOGLOBIN (HGB A1C): Hemoglobin A1C: 5.9 % — AB (ref 4.0–5.6)

## 2024-05-02 MED ORDER — OMEPRAZOLE 40 MG PO CPDR: 40.0000 mg | DELAYED_RELEASE_CAPSULE | Freq: Every day | ORAL | 0 refills | Status: DC

## 2024-05-02 MED ORDER — CITALOPRAM HYDROBROMIDE 20 MG PO TABS: 20.0000 mg | ORAL_TABLET | Freq: Every day | ORAL | 0 refills | Status: DC

## 2024-05-02 MED ORDER — AMLODIPINE BESYLATE 5 MG PO TABS: 5.0000 mg | ORAL_TABLET | Freq: Every day | ORAL | 0 refills | Status: DC

## 2024-05-02 MED ORDER — HYDROXYZINE PAMOATE 25 MG PO CAPS: 25.0000 mg | ORAL_CAPSULE | Freq: Three times a day (TID) | ORAL | 0 refills | Status: DC | PRN

## 2024-05-02 MED ORDER — DULAGLUTIDE 0.75 MG/0.5ML ~~LOC~~ SOAJ: 0.7500 mg | SUBCUTANEOUS | 0 refills | Status: DC

## 2024-05-02 MED ORDER — ATORVASTATIN CALCIUM 20 MG PO TABS
20.0000 mg | ORAL_TABLET | Freq: Every day | ORAL | 0 refills | Status: DC
Start: 2024-05-02 — End: 2024-08-01

## 2024-05-02 NOTE — Progress Notes (Addendum)
 Patient ID: [name], female    DOB: [birth year]  MRN: [id number]  CC: Chronic Conditions Follow-Up  Subjective: [name] is a 53 y.o. female who presents for chronic conditions follow-up.   Her concerns today include:  - Doing well on Amlodipine , no issues/concerns. She does not complain of red flag symptoms such as but not limited to chest pain, shortness of breath, worst headache of life, nausea/vomiting.  - States she has not taken Trulicity  in several months due to she never received an update from prior authorization. She would like to restart on Trulicity . She denies red flag symptoms associated with diabetes.  - Due for diabetic eye exam. - Due for diabetic foot exam. - Doing well on Atorvastatin , no issues/concerns.  - Doing well on Citalopram  and Hydroxyzine , no issues/concerns. States she feels Citalopram  20 mg helps more than 10 mg. She denies thoughts of self-harm, suicidal ideations, homicidal ideations. - Doing well on Omeprazole , no issues/concerns. - Cough persisting. Denies red flag symptoms. She would like referral to specialist. - States she plans to schedule an appointment with Orthopedics for bone spur of left foot.   Patient Active Problem List   Diagnosis Date Noted   Adjustment disorder with mixed anxiety and depressed mood 04/19/2023   Hyperlipidemia 11/16/2022   Diabetes mellitus, type 2 (HCC) 11/16/2022   Trochanteric bursitis, left hip 09/03/2022   Asthmatic bronchitis with acute exacerbation 04/09/2022   Chronic cough 01/27/2022   Right thyroid nodule 01/07/2022   Essential hypertension 01/05/2022   Migraine 01/05/2022   Thyroid nodule greater than or equal to 1.5 cm in diameter incidentally noted on imaging study 12/01/2021   Grief 02/06/2021   Non-intractable vomiting 02/06/2021   Arthritis of right knee    S/P knee replacement 01/06/2021   Leiomyoma of body of uterus 12/24/2016   Prediabetes 11/11/2016   Pap smear abnormality of  cervix with ASCUS favoring benign 01/01/2016   COPD (chronic obstructive pulmonary disease) (HCC) 11/18/2015   GERD (gastroesophageal reflux disease) 11/18/2015   Abnormal imaging of thyroid 11/04/2015   COPD exacerbation (HCC) 11/01/2015   Elevated d-dimer 11/01/2015   IDA (iron  deficiency anemia) 09/04/2015   Patellofemoral disorder 04/04/2015   Headache disorder 07/30/2014   Insomnia 04/09/2014   Cervical spondylosis with radiculopathy 03/25/2014   S/P cervical spinal fusion 03/06/2014   Class 2 severe obesity due to excess calories with serious comorbidity and body mass index (BMI) of 37.0 to 37.9 in adult (HCC) 02/22/2014   Brachial radiculitis 01/29/2014   Radicular pain in right arm 01/11/2014   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   Other and unspecified hyperlipidemia 08/24/2013   History of CVA (cerebrovascular accident) 05/18/2013   Tobacco abuse, 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     Current Outpatient Medications on File Prior to Visit  Medication Sig Dispense Refill   albuterol  (VENTOLIN  HFA) 108 (90 Base) MCG/ACT inhaler Inhale 2 puffs into the lungs every 6 (six) hours as needed for wheezing or shortness of breath. 8 g 5   amLODipine  (NORVASC ) 5 MG tablet Take 1 tablet (5 mg total) by mouth daily. 90 tablet 0   doxepin  (SINEQUAN ) 50 MG capsule Take 1 capsule (50 mg total) by mouth at bedtime. 30 capsule 1   gabapentin  (NEURONTIN ) 100 MG capsule Take 1 capsule (100 mg total) by mouth 4 (four) times daily. 120 capsule 0   No current facility-administered medications on file prior to visit.    Allergies  Allergen Reactions   Shellfish  Allergy  Itching and Swelling   Latex Itching and Rash    Social History   Socioeconomic History   Marital status: Divorced    Spouse name: Not on file   Number of children: 7   Years of education: GEd   Highest education level: Not on file  Occupational History   Occupation: CNA    Employer: [employer]  Tobacco Use    Smoking status: Former    Current packs/day: 0.00    Average packs/day: 0.5 packs/day for 26.0 years (13.0 ttl pk-yrs)    Types: Cigarettes    Start date: 10/08/1989    Quit date: 10/09/2015    Years since quitting: 8.5    Passive exposure: Past   Smokeless tobacco: Former  Building services engineer status: Never Used  Substance and Sexual Activity   Alcohol  use: Yes    Comment: occasionally   Drug use: No   Sexual activity: Not Currently    Birth control/protection: Surgical  Other Topics Concern   Not on file  Social History Narrative   Patient lives at home with her family    She has 7 children   5 grown and out of the house   2 at home 79 and 14   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 [employer]: Low Risk  (05/02/2024)   Overall Financial Resource Strain (CARDIA)    Difficulty of Paying Living Expenses: Not hard at all  Food Insecurity: No Food Insecurity (05/02/2024)   Hunger Vital Sign    Worried About Running Out of Food in the Last Year: Never true    Ran Out of Food in the Last Year: Never true  Transportation Needs: No Transportation Needs (05/02/2024)   PRAPARE - Administrator, Civil Service (Medical): No    Lack of Transportation (Non-Medical): No  Physical Activity: Insufficiently Active (05/02/2024)   Exercise Vital Sign    Days of Exercise per Week: 2 days    Minutes of Exercise per Session: 40 min  Stress: No Stress Concern Present (05/02/2024)   [employer] of Occupational Health - Occupational Stress Questionnaire    Feeling of Stress: Only a little  Social Connections: Unknown (05/02/2024)   Social Connection and Isolation Panel    Frequency of Communication with Friends and Family: Not on file    Frequency of Social Gatherings with Friends and Family: Not on file    Attends Religious Services: Not on file    Active Member of Clubs or Organizations: Not on file    Attends Banker  Meetings: Not on file    Marital Status: Divorced  Recent Concern: Social Connections - Moderately Isolated (05/02/2024)   Social Connection and Isolation Panel    Frequency of Communication with Friends and Family: More than three times a week    Frequency of Social Gatherings with Friends and Family: Twice a week    Attends Religious Services: 1 to 4 times per year    Active Member of [employer] or Organizations: No    Attends Banker Meetings: Never    Marital Status: Never married  Intimate Partner Violence: Not At Risk (05/02/2024)   Humiliation, Afraid, Rape, and Kick questionnaire    Fear of Current or Ex-Partner: No    Emotionally Abused: No    Physically Abused: No    Sexually Abused: No    Family History  Problem Relation Age  of Onset   Renal Disease Father        dialysis   Hypertension Father    Heart disease Mother    Heart disease Maternal Grandfather    Asthma Sister    Asthma Maternal Aunt     Past Surgical History:  Procedure Laterality Date   ANTERIOR CERVICAL DECOMP/DISCECTOMY FUSION N/A 03/06/2014   Procedure: ANTERIOR CERVICAL DECOMPRESSION/DISCECTOMY FUSION 2 LEVELS four/five, five/six;  Surgeon: Alm GORMAN Molt, MD;  Location: [facility] NEURO ORS;  Service: Neurosurgery;  Laterality: N/A;   CESAREAN SECTION     x 1 with 5th pregnancy   CHOLECYSTECTOMY     KNEE ARTHROSCOPY Right 2015   TEE WITHOUT CARDIOVERSION N/A 05/21/2013   Procedure: TRANSESOPHAGEAL ECHOCARDIOGRAM (TEE);  Surgeon: Wilbert JONELLE Bihari, MD;  Location: [facility] ENDOSCOPY;  Service: Cardiovascular;  Laterality: N/A;   TOTAL KNEE ARTHROPLASTY Right 01/06/2021   TOTAL KNEE ARTHROPLASTY Right 01/06/2021   Procedure: RIGHT TOTAL KNEE ARTHROPLASTY;  Surgeon: Addie Cordella Hamilton, MD;  Location: [facility] OR;  Service: Orthopedics;  Laterality: Right;   TUBAL LIGATION      ROS: Review of Systems Negative except as stated above  PHYSICAL EXAM: BP 139/85   Pulse 66   Temp 98.5 F (36.9 C) (Oral)   Resp 16    Ht 5' 3.5 (1.613 m)   Wt 180 lb 12.8 oz (82 kg)   SpO2 94%   BMI 31.52 kg/m   Physical Exam HENT:     Head: Normocephalic and atraumatic.     Nose: Nose normal.     Mouth/Throat:     Mouth: Mucous membranes are moist.     Pharynx: Oropharynx is clear.   Eyes:     Extraocular Movements: Extraocular movements intact.     Conjunctiva/sclera: Conjunctivae normal.     Pupils: Pupils are equal, round, and reactive to light.    Cardiovascular:     Rate and Rhythm: Normal rate and regular rhythm.     Pulses: Normal pulses.     Heart sounds: Normal heart sounds.  Pulmonary:     Effort: Pulmonary effort is normal.     Breath sounds: Normal breath sounds.   Musculoskeletal:        General: Normal range of motion.     Cervical back: Normal range of motion and neck supple.   Neurological:     General: No focal deficit present.     Mental Status: She is alert and oriented to person, place, and time.   Psychiatric:        Mood and Affect: Mood normal.        Behavior: Behavior normal.     ASSESSMENT AND PLAN: 1. Primary hypertension (Primary) - Continue Amlodipine  as prescribed.  - Counseled on blood pressure goal of less than 130/80, low-sodium, DASH diet, medication compliance, and 150 minutes of moderate intensity exercise per week as tolerated. Counseled on medication adherence and adverse effects. - Follow-up with primary provider in 3 months or sooner if needed.  - amLODipine  (NORVASC ) 5 MG tablet; Take 1 tablet (5 mg total) by mouth daily.  Dispense: 90 tablet; Refill: 0  2. Type 2 diabetes mellitus with hyperglycemia, without long-term current use of insulin (HCC) - Dulagutide as prescribed.  - Hemoglobin A1c result pending.  - Discussed the importance of healthy eating habits, low-carbohydrate diet, low-sugar diet, regular aerobic exercise (at least 150 minutes a week as tolerated) and medication compliance to achieve or maintain control of diabetes. Counseled on  medication adherence/adverse  effects.  - Follow-up with primary provider in 4 weeks or sooner if needed.  - Dulaglutide  0.75 MG/0.5ML SOAJ; Inject 0.75 mg into the skin once a week.  Dispense: 6 mL; Refill: 0 - POCT glycosylated hemoglobin (Hb A1C)  3. Diabetic eye exam [facility]) - Referral to Ophthalmology for evaluation/management. - Ambulatory referral to Ophthalmology  4. Encounter for diabetic foot exam [facility]) - Referral to Podiatry for evaluation/management. - Ambulatory referral to Podiatry  5. Hyperlipidemia, unspecified hyperlipidemia type - Continue Atorvastatin  as prescribed. Counseled on medication adherence/adverse effects.  - Follow-up with primary provider in 3 months or sooner if needed. - atorvastatin  (LIPITOR) 20 MG tablet; Take 1 tablet (20 mg total) by mouth daily.  Dispense: 90 tablet; Refill: 0  6. Generalized anxiety disorder - Patient denies thoughts of self-harm, suicidal ideations, homicidal ideations. - Increase Citalopram  from 10 mg to 20 mg as prescribed. Counseled on medication adherence/adverse effects.  - Continue Hydroxyzine  as prescribed. Counseled on medication adherence/adverse effects.  - Follow-up with primary provider in 3 months or sooner if needed.  - citalopram  (CELEXA ) 20 MG tablet; Take 1 tablet (20 mg total) by mouth daily.  Dispense: 90 tablet; Refill: 0 - hydrOXYzine  (VISTARIL ) 25 MG capsule; Take 1 capsule (25 mg total) by mouth every 8 (eight) hours as needed.  Dispense: 90 capsule; Refill: 0  7. Gastroesophageal reflux disease, unspecified whether esophagitis present - Continue Omeprazole  as prescribed. Counseled on medication adherence/adverse effects. - Follow-up with primary provider in 3 months or sooner if needed.  - omeprazole  (PRILOSEC) 40 MG capsule; Take 1 capsule (40 mg total) by mouth daily.  Dispense: 90 capsule; Refill: 0  8. Chronic cough - Patient today in office with no cardiopulmonary/acute distress. - Referral to  Pulmonology for evaluation/management. - Ambulatory referral to Pulmonology  9. Bone spur of foot - Referral to Podiatry for evaluation/management. - Ambulatory referral to Podiatry   Patient was given the opportunity to ask questions.  Patient verbalized understanding of the plan and was able to repeat key elements of the plan. Patient was given clear instructions to go to Emergency Department or return to medical center if symptoms don't improve, worsen, or new problems develop.The patient verbalized understanding.   Orders Placed This Encounter  Procedures   Ambulatory referral to Ophthalmology   Ambulatory referral to Podiatry   Ambulatory referral to Pulmonology   Ambulatory referral to Podiatry   POCT glycosylated hemoglobin (Hb A1C)     Requested Prescriptions   Signed Prescriptions Disp Refills   amLODipine  (NORVASC ) 5 MG tablet 90 tablet 0    Sig: Take 1 tablet (5 mg total) by mouth daily.   Dulaglutide  0.75 MG/0.5ML SOAJ 6 mL 0    Sig: Inject 0.75 mg into the skin once a week.   atorvastatin  (LIPITOR) 20 MG tablet 90 tablet 0    Sig: Take 1 tablet (20 mg total) by mouth daily.   omeprazole  (PRILOSEC) 40 MG capsule 90 capsule 0    Sig: Take 1 capsule (40 mg total) by mouth daily.   citalopram  (CELEXA ) 20 MG tablet 90 tablet 0    Sig: Take 1 tablet (20 mg total) by mouth daily.   hydrOXYzine  (VISTARIL ) 25 MG capsule 90 capsule 0    Sig: Take 1 capsule (25 mg total) by mouth every 8 (eight) hours as needed.    Follow-up with primary provider as scheduled.  Greig JINNY Drones, NP

## 2024-05-02 NOTE — Progress Notes (Signed)
 Follow up.

## 2024-05-02 NOTE — Addendum Note (Signed)
 Addended by: LORREN GREIG PARAS on: 05/02/2024 05:34 PM   Modules accepted: Orders

## 2024-05-09 ENCOUNTER — Other Ambulatory Visit: Payer: Self-pay

## 2024-05-14 ENCOUNTER — Other Ambulatory Visit: Payer: Self-pay

## 2024-05-15 ENCOUNTER — Encounter: Payer: Self-pay | Admitting: Family

## 2024-05-15 ENCOUNTER — Other Ambulatory Visit: Payer: Self-pay

## 2024-05-16 ENCOUNTER — Other Ambulatory Visit: Payer: Self-pay

## 2024-05-17 NOTE — Telephone Encounter (Signed)
 You are welcome

## 2024-05-18 ENCOUNTER — Other Ambulatory Visit: Payer: Self-pay

## 2024-05-22 ENCOUNTER — Ambulatory Visit (INDEPENDENT_AMBULATORY_CARE_PROVIDER_SITE_OTHER): Payer: PRIVATE HEALTH INSURANCE | Admitting: Podiatry

## 2024-05-22 VITALS — Ht 63.5 in | Wt 180.8 lb

## 2024-05-22 DIAGNOSIS — M19072 Primary osteoarthritis, left ankle and foot: Secondary | ICD-10-CM

## 2024-05-22 NOTE — Progress Notes (Signed)
 Subjective:  Patient ID: [name], female    DOB: 1971-01-22,  MRN: [id number]  Chief Complaint  Patient presents with   Foot Pain    RM 2 Patient is here for left hallux pain. Paint would like a review of the xray's taken on 03/28/24    Discussed the use of AI scribe software for clinical note transcription with the patient, who gave verbal consent to proceed.  History of Present Illness [name] is a 53 year old female with hallux limitus who presents with worsening pain in her left big toe. She was referred for evaluation of diabetic foot concerns.  She has been experiencing intermittent pain in her left big toe for the past couple of years, which has recently worsened. The pain occurs suddenly while walking and can become so severe that she is unable to walk. It is not influenced by footwear and occurs regardless of the type of shoes worn. Some days are pain-free, while on other days, the pain significantly impacts her ability to walk.  She has tried Tylenol  and Motrin  for pain relief without effectiveness. The pain is sometimes accompanied by swelling and soreness, particularly if the toe bends the wrong way during walking. 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  She has a history of diabetes with an A1c of 5.9%. She has been on medication for diabetes in the past.  No recent injuries to the foot. She experiences pain upon movement of the toe.      Objective:    Physical Exam VASCULAR: DP and PT pulse palpable. Foot is warm and well-perfused. Capillary fill time is brisk. DERMATOLOGIC: Normal skin turgor, texture, and temperature. No open lesions, rashes, or ulcerations. NEUROLOGIC: Normal sensation to light touch and pressure. No paresthesias. ORTHOPEDIC: Hallux limitus with palpable dorsal spurring on left foot. Smooth, pain-free range of motion of all other examined joints. No ecchymosis,  bruising, or gross deformity.        Results LABS A1c: 5.9%  RADIOLOGY Foot radiographs: Dorsal and lateral exostosis on the first metatarsal head. Non-weight-bearing films. (03/28/2024)   Assessment:   1. Arthritis of first metatarsophalangeal (MTP) joint of left foot      Plan:  Patient was evaluated and treated and all questions answered.  Assessment and Plan Assessment & Plan Hallux limitus with dorsal spurring Chronic pain in the left hallux joint, worsening over the past few years, with a bone spur due to joint jamming, indicative of early arthritis. Pain is intermittent and affects daily activities, including ambulation and occupational tasks. Conservative management with NSAIDs has been ineffective. - Order CT scan of the left foot to assess the level of arthritis and joint surface. - Provide postop shoe for exacerbations to limit toe movement at her request. - Recommend Morton's extension carbon fiber insole to reduce toe bending. - Consider steroid injection if pain becomes severe and persistent. - Discuss surgical options: bone spur removal with a recovery time of 2-3 weeks, or joint fusion addressing both spur and arthritis with a recovery of about two months and resulting in loss of toe motion. - Schedule follow-up visit in two months. -CT scan ordered she will schedule if these treatment measures are not effective so that we may provide surgical planning  Diabetes mellitus A1c is 5.9%, indicating excellent glycemic control. No current impact on foot health due to diabetes.  Return in about 2 months (around 07/23/2024) for Follow-up left first MTP arthritis.

## 2024-06-01 ENCOUNTER — Encounter: Payer: Self-pay | Admitting: Podiatry

## 2024-06-05 ENCOUNTER — Encounter: Payer: Self-pay | Admitting: Podiatry

## 2024-06-05 NOTE — Telephone Encounter (Signed)
 See prev note. Pt is req note to be able to wear shoe at work. Sent a mess via MyChart to verf correct email address to send note.

## 2024-06-05 NOTE — Telephone Encounter (Signed)
 Emailed letter to pt to the email address on file.  See prev notes and advised her via MyChart

## 2024-06-09 ENCOUNTER — Encounter: Payer: Self-pay | Admitting: Family

## 2024-06-11 ENCOUNTER — Encounter: Payer: Self-pay | Admitting: Internal Medicine

## 2024-06-11 NOTE — Telephone Encounter (Signed)
 Requested Budesonide  nasal spray does not appear on patient's medication list. I will need prescription sig to prescribe correctly. Also, recommendation for patient to send medication refill request to Pulmonology.

## 2024-06-12 NOTE — Telephone Encounter (Signed)
 Please schedule  I've reached out no answer I won't see the new dr. Until 9/4 so do I have to go through urgent care? They are the ones who prescribed it?

## 2024-06-12 NOTE — Telephone Encounter (Signed)
 I've reached out no answer I won't see the new dr. Until 9/4 so do I have to go through urgent care? They are the ones who prescribed it?

## 2024-06-13 ENCOUNTER — Ambulatory Visit (INDEPENDENT_AMBULATORY_CARE_PROVIDER_SITE_OTHER): Payer: PRIVATE HEALTH INSURANCE | Admitting: Family Medicine

## 2024-06-13 VITALS — BP 115/77 | HR 100 | Ht 63.0 in | Wt 177.4 lb

## 2024-06-13 DIAGNOSIS — J309 Allergic rhinitis, unspecified: Secondary | ICD-10-CM

## 2024-06-13 MED ORDER — BUDESONIDE 32 MCG/ACT NA SUSP: 1.0000 | Freq: Every day | NASAL | 2 refills | Status: AC

## 2024-06-13 NOTE — Progress Notes (Unsigned)
 Established Patient Office Visit  Subjective    Patient ID: [name], female    DOB: 06/15/1971  Age: 53 y.o. MRN: [id number]  CC:  Chief Complaint  Patient presents with   Medical Management of Chronic Issues    Pt reports she recently had bronchitis and would like a refill of nasal spray to help with her coughs. Pt reports drainage on left side of face (eyes and nose)    HPI [name] presents for complaint of nasal congestion and want a refill of nasal spray that has helped in the past.  Outpatient Encounter Medications as of 06/13/2024  Medication Sig   albuterol  (VENTOLIN  HFA) 108 (90 Base) MCG/ACT inhaler Inhale 2 puffs into the lungs every 6 (six) hours as needed for wheezing or shortness of breath.   amLODipine  (NORVASC ) 5 MG tablet Take 1 tablet (5 mg total) by mouth daily.   amLODipine  (NORVASC ) 5 MG tablet Take 1 tablet (5 mg total) by mouth daily.   atorvastatin  (LIPITOR) 20 MG tablet Take 1 tablet (20 mg total) by mouth daily.   budesonide  (RHINOCORT  AQUA) 32 MCG/ACT nasal spray Place 1 spray into both nostrils daily.   citalopram  (CELEXA ) 20 MG tablet Take 1 tablet (20 mg total) by mouth daily.   doxepin  (SINEQUAN ) 50 MG capsule Take 1 capsule (50 mg total) by mouth at bedtime.   Dulaglutide  0.75 MG/0.5ML SOAJ Inject 0.75 mg into the skin once a week.   gabapentin  (NEURONTIN ) 100 MG capsule Take 1 capsule (100 mg total) by mouth 4 (four) times daily.   hydrOXYzine  (VISTARIL ) 25 MG capsule Take 1 capsule (25 mg total) by mouth every 8 (eight) hours as needed.   omeprazole  (PRILOSEC) 40 MG capsule Take 1 capsule (40 mg total) by mouth daily.   No facility-administered encounter medications on file as of 06/13/2024.    Past Medical History:  Diagnosis Date   Anemia    Anxiety    Arthritis    right knee, back   COPD (chronic obstructive pulmonary disease) (HCC)    GERD (gastroesophageal reflux disease)    Headache(784.0)    History of blood transfusion     Hx; of in 1991 after delivery   History of TIAs    Hypertension    Numbness    Right hand   Pneumonia    Stroke (HCC) 05/2013    Past Surgical History:  Procedure Laterality Date   ANTERIOR CERVICAL DECOMP/DISCECTOMY FUSION N/A 03/06/2014   Procedure: ANTERIOR CERVICAL DECOMPRESSION/DISCECTOMY FUSION 2 LEVELS four/five, five/six;  Surgeon: Alm GORMAN Molt, MD;  Location: [facility] NEURO ORS;  Service: Neurosurgery;  Laterality: N/A;   CESAREAN SECTION     x 1 with 5th pregnancy   CHOLECYSTECTOMY     KNEE ARTHROSCOPY Right 2015   TEE WITHOUT CARDIOVERSION N/A 05/21/2013   Procedure: TRANSESOPHAGEAL ECHOCARDIOGRAM (TEE);  Surgeon: Wilbert JONELLE Bihari, MD;  Location: [facility] ENDOSCOPY;  Service: Cardiovascular;  Laterality: N/A;   TOTAL KNEE ARTHROPLASTY Right 01/06/2021   TOTAL KNEE ARTHROPLASTY Right 01/06/2021   Procedure: RIGHT TOTAL KNEE ARTHROPLASTY;  Surgeon: Addie Cordella Hamilton, MD;  Location: [facility] OR;  Service: Orthopedics;  Laterality: Right;   TUBAL LIGATION      Family History  Problem Relation Age of Onset   Renal Disease Father        dialysis   Hypertension Father    Heart disease Mother    Heart disease Maternal Grandfather    Asthma Sister    Asthma  Maternal Aunt     Social History   Socioeconomic History   Marital status: Divorced    Spouse name: Not on file   Number of children: 7   Years of education: GEd   Highest education level: GED or equivalent  Occupational History   Occupation: Scientist, research (medical): [employer]  Tobacco Use   Smoking status: Former    Current packs/day: 0.00    Average packs/day: 0.5 packs/day for 26.0 years (13.0 ttl pk-yrs)    Types: Cigarettes    Start date: 10/08/1989    Quit date: 10/09/2015    Years since quitting: 8.6    Passive exposure: Past   Smokeless tobacco: Former  Building services engineer status: Never Used  Substance and Sexual Activity   Alcohol  use: Yes    Comment: occasionally   Drug use: No   Sexual activity: Not Currently     Birth control/protection: Surgical  Other Topics Concern   Not on file  Social History Narrative   Patient lives at home with her family    She has 7 children   5 grown and out of the house   2 at home 36 and 14   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 [employer] Strain: Medium Risk (06/13/2024)   Overall Financial Resource Strain (CARDIA)    Difficulty of Paying Living Expenses: Somewhat hard  Food Insecurity: Food Insecurity Present (06/13/2024)   Hunger Vital Sign    Worried About Running Out of Food in the Last Year: Sometimes true    Ran Out of Food in the Last Year: Sometimes true  Transportation Needs: No Transportation Needs (06/13/2024)   PRAPARE - Administrator, Civil Service (Medical): No    Lack of Transportation (Non-Medical): No  Physical Activity: Unknown (06/13/2024)   Exercise Vital Sign    Days of Exercise per Week: Patient declined    Minutes of Exercise per Session: Not on file  Recent Concern: Physical Activity - Insufficiently Active (05/02/2024)   Exercise Vital Sign    Days of Exercise per Week: 2 days    Minutes of Exercise per Session: 40 min  Stress: Stress Concern Present (06/13/2024)   [employer] of Occupational Health - Occupational Stress Questionnaire    Feeling of Stress: To some extent  Social Connections: Socially Isolated (06/13/2024)   Social Connection and Isolation Panel    Frequency of Communication with Friends and Family: Once a week    Frequency of Social Gatherings with Friends and Family: Once a week    Attends Religious Services: 1 to 4 times per year    Active Member of [employer] or Organizations: No    Attends Engineer, structural: Not on file    Marital Status: Divorced  Intimate Partner Violence: Not At Risk (05/02/2024)   Humiliation, Afraid, Rape, and Kick questionnaire    Fear of Current or Ex-Partner: No    Emotionally Abused: No    Physically Abused: No    Sexually  Abused: No    Review of Systems  HENT:  Positive for congestion.   All other systems reviewed and are negative.       Objective    BP 115/77   Pulse 100   Ht 5' 3 (1.6 m)   Wt 177 lb 6.4 oz (80.5 kg)   LMP 11/09/2023   SpO2 93%   BMI 31.42 kg/m  Physical Exam Vitals and nursing note reviewed.  Constitutional:      General: She is not in acute distress. HENT:     Nose: Congestion present.  Cardiovascular:     Rate and Rhythm: Normal rate and regular rhythm.  Pulmonary:     Effort: Pulmonary effort is normal.     Breath sounds: Normal breath sounds.  Neurological:     General: No focal deficit present.     Mental Status: She is alert and oriented to person, place, and time.         Assessment & Plan:   Allergic rhinitis, unspecified seasonality, unspecified trigger  Other orders -     Budesonide ; Place 1 spray into both nostrils daily.  Dispense: 8.43 mL; Refill: 2     No follow-ups on file.   Tanda Raguel SQUIBB, MD

## 2024-06-13 NOTE — Telephone Encounter (Signed)
**Note De-identified  [place] Obfuscation** Please advise 

## 2024-06-15 ENCOUNTER — Encounter: Payer: Self-pay | Admitting: Family Medicine

## 2024-06-24 ENCOUNTER — Other Ambulatory Visit: Payer: Self-pay | Admitting: Family

## 2024-06-24 DIAGNOSIS — F411 Generalized anxiety disorder: Secondary | ICD-10-CM

## 2024-06-26 NOTE — Telephone Encounter (Signed)
 Complete

## 2024-07-04 ENCOUNTER — Encounter: Payer: Self-pay | Admitting: Physician Assistant

## 2024-07-04 ENCOUNTER — Telehealth: Payer: PRIVATE HEALTH INSURANCE | Admitting: Physician Assistant

## 2024-07-04 DIAGNOSIS — R110 Nausea: Secondary | ICD-10-CM

## 2024-07-04 DIAGNOSIS — Z20822 Contact with and (suspected) exposure to covid-19: Secondary | ICD-10-CM | POA: Diagnosis not present

## 2024-07-04 DIAGNOSIS — R52 Pain, unspecified: Secondary | ICD-10-CM

## 2024-07-04 MED ORDER — PROMETHAZINE HCL 12.5 MG PO TABS: 12.5000 mg | ORAL_TABLET | Freq: Three times a day (TID) | ORAL | 0 refills | Status: AC | PRN

## 2024-07-04 NOTE — Patient Instructions (Addendum)
 [name], thank you for joining Janziel Hockett, PA-C for today's virtual visit.  While this provider is not your primary care provider (PCP), if your PCP is located in our provider database this encounter information will be shared with them immediately following your visit.   A [facility] MyChart account gives you access to today's visit and all your visits, tests, and labs performed at [facility]  click here if you don't have a [facility] MyChart account or go to [email or web address]  Consent: (Patient) [name] provided verbal consent for this virtual visit at the beginning of the encounter.  Current Medications:  Current Outpatient Medications:    promethazine  (PHENERGAN ) 12.5 MG tablet, Take 1 tablet (12.5 mg total) by mouth every 8 (eight) hours as needed for up to 5 days for nausea or vomiting., Disp: 15 tablet, Rfl: 0   albuterol  (VENTOLIN  HFA) 108 (90 Base) MCG/ACT inhaler, Inhale 2 puffs into the lungs every 6 (six) hours as needed for wheezing or shortness of breath., Disp: 8 g, Rfl: 5   amLODipine  (NORVASC ) 5 MG tablet, Take 1 tablet (5 mg total) by mouth daily., Disp: 90 tablet, Rfl: 0   amLODipine  (NORVASC ) 5 MG tablet, Take 1 tablet (5 mg total) by mouth daily., Disp: 90 tablet, Rfl: 0   atorvastatin  (LIPITOR) 20 MG tablet, Take 1 tablet (20 mg total) by mouth daily., Disp: 90 tablet, Rfl: 0   budesonide  (RHINOCORT  AQUA) 32 MCG/ACT nasal spray, Place 1 spray into both nostrils daily., Disp: 8.43 mL, Rfl: 2   citalopram  (CELEXA ) 10 MG tablet, Take 1 tablet by mouth once daily, Disp: 90 tablet, Rfl: 0   citalopram  (CELEXA ) 20 MG tablet, Take 1 tablet (20 mg total) by mouth daily., Disp: 90 tablet, Rfl: 0   doxepin  (SINEQUAN ) 50 MG capsule, Take 1 capsule (50 mg total) by mouth at bedtime., Disp: 30 capsule, Rfl: 1   Dulaglutide  0.75 MG/0.5ML SOAJ, Inject 0.75 mg into the skin once a week., Disp: 6 mL, Rfl: 0   gabapentin  (NEURONTIN ) 100 MG capsule,  Take 1 capsule (100 mg total) by mouth 4 (four) times daily., Disp: 120 capsule, Rfl: 0   hydrOXYzine  (VISTARIL ) 25 MG capsule, Take 1 capsule (25 mg total) by mouth every 8 (eight) hours as needed., Disp: 90 capsule, Rfl: 0   omeprazole  (PRILOSEC) 40 MG capsule, Take 1 capsule (40 mg total) by mouth daily., Disp: 90 capsule, Rfl: 0   Medications ordered in this encounter:  Meds ordered this encounter  Medications   promethazine  (PHENERGAN ) 12.5 MG tablet    Sig: Take 1 tablet (12.5 mg total) by mouth every 8 (eight) hours as needed for up to 5 days for nausea or vomiting.    Dispense:  15 tablet    Refill:  0    Supervising Provider:   LAMPTEY, PHILIP O [[id number]]     *If you need refills on other medications prior to your next appointment, please contact your pharmacy*  Follow-Up: Call back or seek an in-person evaluation if the symptoms worsen or if the condition fails to improve as anticipated.  [facility] [phone]  Other Instructions  Get rest and adequate sleep  Drink plenty of water , broth, and other clear fluids to stay hydrated.  Use a cool-mist humidifier or take steamy showers to relieve congestion, if any Use over-the-counter medications like acetaminophen  (Tylenol ) or ibuprofen  (Advil , Motrin ) as needed for fever and pain Take prescribed medications if needed for nausea as  discussed IF change in symtpoms, onset of fever etc, please re-test for covid at home if you can.    Fatigue If you have fatigue, you feel tired all the time and have a lack of energy or a lack of motivation. Fatigue may make it difficult to start or complete tasks because of exhaustion. Occasional or mild fatigue is often a normal response to activity or life. However, long-term (chronic) or extreme fatigue may be a symptom of a medical condition such as: Depression. Not having enough red blood cells or hemoglobin in the blood (anemia). A problem with a small gland located in the  lower front part of the neck (thyroid disorder). Rheumatologic conditions. These are problems related to the body's defense system (immune system). Infections, especially certain viral infections. Fatigue can also lead to negative health outcomes over time. Follow these instructions at home: Medicines Take over-the-counter and prescription medicines only as told by your health care provider. Take a multivitamin if told by your health care provider. Do not use herbal or dietary supplements unless they are approved by your health care provider. Eating and drinking  Avoid heavy meals in the evening. Eat a well-balanced diet, which includes lean proteins, whole grains, plenty of fruits and vegetables, and low-fat dairy products. Avoid eating or drinking too many products with caffeine in them. Avoid alcohol . Drink enough fluid to keep your urine pale yellow. Activity  Exercise regularly, as told by your health care provider. Use or practice techniques to help you relax, such as yoga, tai chi, meditation, or massage therapy. Lifestyle Change situations that cause you stress. Try to keep your work and personal schedules in balance. Do not use recreational or illegal drugs. General instructions Monitor your fatigue for any changes. Go to bed and get up at the same time every day. Avoid fatigue by pacing yourself during the day and getting enough sleep at night. Maintain a healthy weight. Contact a health care provider if: Your fatigue does not get better. You have a fever. You suddenly lose or gain weight. You have headaches. You have trouble falling asleep or sleeping through the night. You feel angry, guilty, anxious, or sad. You have swelling in your legs or another part of your body. Get help right away if: You feel confused, feel like you might faint, or faint. Your vision is blurry or you have a severe headache. You have severe pain in your abdomen, your back, or the area  between your waist and hips (pelvis). You have chest pain, shortness of breath, or an irregular or fast heartbeat. You are unable to urinate, or you urinate less than normal. You have abnormal bleeding from the rectum, nose, lungs, nipples, or, if you are female, the vagina. You vomit blood. You have thoughts about hurting yourself or others. These symptoms may be an emergency. Get help right away. Call 911. Do not wait to see if the symptoms will go away. Do not drive yourself to the hospital. Get help right away if you feel like you may hurt yourself or others, or have thoughts about taking your own life. Go to your nearest emergency room or: Call 911. Call the National Suicide Prevention Lifeline at 5348299889 or 988. This is open 24 hours a day. Text the Crisis Text Line at 712-340-7965. Summary If you have fatigue, you feel tired all the time and have a lack of energy or a lack of motivation. Fatigue may make it difficult to start or complete tasks because of exhaustion.  Long-term (chronic) or extreme fatigue may be a symptom of a medical condition. Exercise regularly, as told by your health care provider. Change situations that cause you stress. Try to keep your work and personal schedules in balance. This information is not intended to replace advice given to you by your health care provider. Make sure you discuss any questions you have with your health care provider. Document Revised: 08/17/2021 Document Reviewed: 08/17/2021 Elsevier Patient Education  2024 Elsevier Inc.  If you have been instructed to have an in-person evaluation today at a local Urgent Care facility, please use the link below. It will take you to a list of all of our available [facility], including address, phone number and hours of operation. Please do not delay care.  [facility]  If you or a family member do not have a primary care provider, use the link below to schedule a visit and  establish care. When you choose a [facility] primary care physician or advanced practice provider, you gain a long-term partner in health. Find a Primary Care Provider  Learn more about [facility]'s in-office and virtual care options: [facility] - Get Care Now

## 2024-07-04 NOTE — Progress Notes (Signed)
 Virtual Visit Consent   [name], you are scheduled for a virtual visit with a [facility] provider today. Just as with appointments in the office, your consent must be obtained to participate. Your consent will be active for this visit and any virtual visit you may have with one of our providers in the next 365 days. If you have a MyChart account, a copy of this consent can be sent to you electronically.  As this is a virtual visit, video technology does not allow for your provider to perform a traditional examination. This may limit your provider's ability to fully assess your condition. If your provider identifies any concerns that need to be evaluated in person or the need to arrange testing (such as labs, EKG, etc.), we will make arrangements to do so. Although advances in technology are sophisticated, we cannot ensure that it will always work on either your end or our end. If the connection with a video visit is poor, the visit may have to be switched to a telephone visit. With either a video or telephone visit, we are not always able to ensure that we have a secure connection.  By engaging in this virtual visit, you consent to the provision of healthcare and authorize for your insurance to be billed (if applicable) for the services provided during this visit. Depending on your insurance coverage, you may receive a charge related to this service.  I need to obtain your verbal consent now. Are you willing to proceed with your visit today? [name] has provided verbal consent on 07/04/2024 for a virtual visit (video or telephone). Selita Staiger, PA-C  Date: 07/04/2024 10:45 AM   Virtual Visit via Video Note   I, [name], connected with  [name]  ([id number], [birth year]) on 07/04/24 at 10:00 AM EDT by a video-enabled telemedicine application and verified that I am speaking with the correct person using two identifiers.  Location: Patient: Virtual Visit Location Patient:  Home Provider: Virtual Visit Location Provider: Home Office   I discussed the limitations of evaluation and management by telemedicine and the availability of in person appointments. The patient expressed understanding and agreed to proceed.    History of Present Illness: [name] is a 53 y.o. who identifies as a female who was assigned female at birth, and is being seen today for not feeling well. Reports starting feeling sick on Monday with fatigue,  body aches. Associated with nausea, but no vomiting, generalized headaches, decreased appetite. 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 Tried zofran  1 time, without relief. Denies runny nose, cough, sore throat, ear pain. Denies changes in urinary habits/ bowel habits. No sick contacts, no recent travel.  Requesting work note for today. At home covid test completed during the video is negative at this time.      HPI: HPI  Problems:  Patient Active Problem List   Diagnosis Date Noted   Adjustment disorder with mixed anxiety and depressed mood 04/19/2023   Hyperlipidemia 11/16/2022   Diabetes mellitus, type 2 (HCC) 11/16/2022   Trochanteric bursitis, left hip 09/03/2022   Asthmatic bronchitis with acute exacerbation 04/09/2022   Chronic cough 01/27/2022   Right thyroid nodule 01/07/2022   Essential hypertension 01/05/2022   Migraine 01/05/2022   Thyroid nodule greater than or equal to 1.5 cm in diameter incidentally noted on imaging study 12/01/2021   Grief 02/06/2021   Non-intractable vomiting  02/06/2021   Arthritis of right knee    S/P knee replacement 01/06/2021   Leiomyoma of body of uterus 12/24/2016   Prediabetes 11/11/2016   Pap smear abnormality of cervix with ASCUS favoring benign 01/01/2016   COPD (chronic obstructive pulmonary disease) (HCC) 11/18/2015   GERD (gastroesophageal reflux disease) 11/18/2015   Abnormal imaging of thyroid 11/04/2015   COPD  exacerbation (HCC) 11/01/2015   Elevated d-dimer 11/01/2015   IDA (iron  deficiency anemia) 09/04/2015   Patellofemoral disorder 04/04/2015   Headache disorder 07/30/2014   Insomnia 04/09/2014   Cervical spondylosis with radiculopathy 03/25/2014   S/P cervical spinal fusion 03/06/2014   Class 2 severe obesity due to excess calories with serious comorbidity and body mass index (BMI) of 37.0 to 37.9 in adult (HCC) 02/22/2014   Brachial radiculitis 01/29/2014   Radicular pain in right arm 01/11/2014   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   Other and unspecified hyperlipidemia 08/24/2013   History of CVA (cerebrovascular accident) 05/18/2013   Tobacco abuse, 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    Allergies:  Allergies  Allergen Reactions   Shellfish Allergy  Itching and Swelling   Latex Itching and Rash   Medications:  Current Outpatient Medications:    promethazine  (PHENERGAN ) 12.5 MG tablet, Take 1 tablet (12.5 mg total) by mouth every 8 (eight) hours as needed for up to 5 days for nausea or vomiting., Disp: 15 tablet, Rfl: 0   albuterol  (VENTOLIN  HFA) 108 (90 Base) MCG/ACT inhaler, Inhale 2 puffs into the lungs every 6 (six) hours as needed for wheezing or shortness of breath., Disp: 8 g, Rfl: 5   amLODipine  (NORVASC ) 5 MG tablet, Take 1 tablet (5 mg total) by mouth daily., Disp: 90 tablet, Rfl: 0   amLODipine  (NORVASC ) 5 MG tablet, Take 1 tablet (5 mg total) by mouth daily., Disp: 90 tablet, Rfl: 0   atorvastatin  (LIPITOR) 20 MG tablet, Take 1 tablet (20 mg total) by mouth daily., Disp: 90 tablet, Rfl: 0   budesonide  (RHINOCORT  AQUA) 32 MCG/ACT nasal spray, Place 1 spray into both nostrils daily., Disp: 8.43 mL, Rfl: 2   citalopram  (CELEXA ) 10 MG tablet, Take 1 tablet by mouth once daily, Disp: 90 tablet, Rfl: 0   citalopram  (CELEXA ) 20 MG tablet, Take 1 tablet (20 mg total) by mouth daily., Disp: 90 tablet, Rfl: 0   doxepin  (SINEQUAN ) 50 MG capsule, Take 1 capsule (50 mg total) by mouth at  bedtime., Disp: 30 capsule, Rfl: 1   Dulaglutide  0.75 MG/0.5ML SOAJ, Inject 0.75 mg into the skin once a week., Disp: 6 mL, Rfl: 0   gabapentin  (NEURONTIN ) 100 MG capsule, Take 1 capsule (100 mg total) by mouth 4 (four) times daily., Disp: 120 capsule, Rfl: 0   hydrOXYzine  (VISTARIL ) 25 MG capsule, Take 1 capsule (25 mg total) by mouth every 8 (eight) hours as needed., Disp: 90 capsule, Rfl: 0   omeprazole  (PRILOSEC) 40 MG capsule, Take 1 capsule (40 mg total) by mouth daily., Disp: 90 capsule, Rfl: 0  Observations/Objective: Patient is well-developed, well-nourished in no acute distress.  Resting comfortably  in bed at home.  Head is normocephalic, atraumatic.  No labored breathing. No signs of congestion, no coughing noted Sinus non-tender to palpation by patient Speech is clear and coherent with logical content.  Patient is alert and oriented at baseline.    Assessment and Plan: 1. Exposure to COVID-19 virus (Primary)  2. Nausea - promethazine  (PHENERGAN ) 12.5 MG tablet; Take 1 tablet (12.5 mg total) by mouth every  8 (eight) hours as needed for up to 5 days for nausea or vomiting.  Dispense: 15 tablet; Refill: 0  3. Generalized body aches   Get rest and adequate sleep  Drink plenty of water , broth, and other clear fluids to stay hydrated.  Use a cool-mist humidifier or take steamy showers to relieve congestion, if any Use over-the-counter medications like acetaminophen  (Tylenol ) or ibuprofen  (Advil , Motrin ) as needed for fever and pain Take prescribed medications if needed for nausea as discussed  Follow Up Instructions: I discussed the assessment and treatment plan with the patient. The patient was provided an opportunity to ask questions and all were answered. The patient agreed with the plan and demonstrated an understanding of the instructions.  A copy of instructions were sent to the patient via MyChart unless otherwise noted below.    The patient was advised to call back  or seek an in-person evaluation if the symptoms worsen or if the condition fails to improve as anticipated.    Elora Wolter, PA-C

## 2024-07-09 NOTE — Progress Notes (Deleted)
 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 Deepak Advani.  Pt c/o cough off and on since Oct 2015.  Cough is non prod and sometimes coughs until she vomits. Cough seems worse when she lies down.  She has albuterol  inhaler but has not needed to use recently.   last episode of severe daytime cough  was sev weeks prior to OV  rx with abx and steroids Has not been able to sleep flat x 6 months even with abx / prednisone  / dry hacking cough  rec Reglan  10 mg take 30 min before every meal and at bedtime until you run out of the prescription  Pantoprazole  (protonix ) 40 mg   Take 30-60 min before first meal of the day and Pepcid  20 mg one bedtime until return to office   For cough take delsym up to 2 tsp every 12 hours as needed  Take chlortrimeton (chlorpheniramine) 4 mg x 2 at bedtime   available over the counter (may cause drowsiness)  GERD diet    03/27/2015 f/u ov/[name] re: chronic cough x 6 m, quit smoking x 6 weeks Chief Complaint  Patient presents with   Follow-up    Cough has been worse for the past wk- prod with clear sputum. She has been using her albuterol  once per day on average.    not using h1, no candy on hand  Has neurontin  but not using consistently - supposed to be on 100 tid for back problems       Kouffman Reflux v Neurogenic Cough Differentiator Reflux  Comments  Do you awaken from a sound sleep coughing violently?                            With trouble breathing? Yes   Do you have choking episodes when you cannot  Get enough air, gasping for air ?              Yes   Do you usually cough when you lie down into  The bed, or when you just lie down to rest ?                          Yes   Do you usually cough after meals or eating?         no   Do you cough when (or after) you bend over?    no   GERD SCORE     Kouffman Reflux v Neurogenic Cough Differentiator Neurogenic   Do you more-or-less cough all day long? Off and on   Does change of temperature make you cough? Heat chokes   Does laughing or chuckling cause you to cough? no   Do fumes (perfume, automobile fumes, burned  Toast, etc.,) cause you to cough ?      no   Does speaking, singing, or talking on the phone cause you to cough   ?               No    Neurogenic/Airway score     Rec Stop metaclopramide Start neurontin  (gapentin) 100 mg three times daily with meals and still coughing build it up to 300 mg three times a day  Pantoprazole  (protonix ) 40 mg   Take 30-60 min before first meal of the day and Pepcid  20 mg one bedtime until return to office   For cough take delsym up to 2 tsp every 12 hours as needed  Take chlortrimeton (chlorpheniramine) 4 mg x 2 at bedtime   available over the counter (may cause drowsiness)  GERD diet reviewed, bed blocks rec  Prednisone  10 mg take  4 each am x 2 days,   2 each am x 2 days,  1 each am x 2 days and stop    [name] last eval 09/24/22 rec Asthmatic bronchitis Mild restrictive defect --CONTINUE Advair  250 mcg ONE puff TWICE a day. 1st preference --CONTINUE Spiriva  1.25 mcg TWO puffs ONCE a day Take this if you can get it --CONTINUE Albuterol  every 4 hours AS NEEDED --CONTINUE Albuterol  nebulizer q6h AS NEEDED --Discussed vaccinations. UTD on influenza. Ok to get COVID booster.   Chronic cough  --CONTINUE omeprazole  20 mg twice a  day --CONTINUE atrovent  nasal spray twice a day   07/10/2024  Re-establish ov/[name] re: ***   maint on ***  No chief complaint on file.   Dyspnea:  *** Cough: *** Sleeping: *** resp cc  SABA use: *** 02: ***  Lung cancer screening :  ***    No obvious day to day or daytime variability or assoc excess/ purulent sputum or mucus plugs or hemoptysis or cp or chest tightness, subjective wheeze or overt sinus or hb symptoms.    Also denies any obvious fluctuation of symptoms with weather or environmental changes or other aggravating or alleviating factors except as outlined above   No unusual exposure hx or h/o childhood pna/ asthma or knowledge of premature birth.  Current Allergies, Complete Past Medical History, Past Surgical History, Family History, and Social History were reviewed in [employer] record.  ROS  The following are not active complaints unless bolded Hoarseness, sore throat, dysphagia, dental problems, itching, sneezing,  nasal congestion or discharge of excess mucus or purulent secretions, ear ache,   fever, chills, sweats, unintended wt loss or wt gain, classically pleuritic or exertional cp,  orthopnea pnd or arm/hand swelling  or leg swelling, presyncope, palpitations, abdominal pain, anorexia, nausea, vomiting, diarrhea  or change in bowel habits or change in bladder habits, change in stools or change in urine, dysuria, hematuria,  rash, arthralgias, visual complaints, headache, numbness, weakness or ataxia or problems with walking or coordination,  change in mood or  memory.        No outpatient medications have been marked as taking for the 07/10/24 encounter (Appointment) with Traci Plemons B, MD.                        Objective:  Physical Exam   Wts   07/10/2024          ***  03/27/2015        210     03/11/15 209 lb 9.6 oz (95.074 kg)  02/21/15  207 lb (93.895 kg)  01/16/15 209 lb (94.802 kg)       Vital signs reviewed  07/10/2024   - Note at rest 02 sats  ***% on ***   General appearance:    ***       Assessment & Plan:

## 2024-07-10 ENCOUNTER — Ambulatory Visit: Payer: PRIVATE HEALTH INSURANCE | Admitting: Internal Medicine

## 2024-08-01 ENCOUNTER — Encounter: Payer: Self-pay | Admitting: Family

## 2024-08-01 ENCOUNTER — Ambulatory Visit (INDEPENDENT_AMBULATORY_CARE_PROVIDER_SITE_OTHER): Payer: PRIVATE HEALTH INSURANCE | Admitting: Family

## 2024-08-01 VITALS — BP 127/86 | HR 82 | Temp 98.4°F | Resp 16 | Ht 63.0 in | Wt 176.2 lb

## 2024-08-01 DIAGNOSIS — F419 Anxiety disorder, unspecified: Secondary | ICD-10-CM

## 2024-08-01 DIAGNOSIS — E1165 Type 2 diabetes mellitus with hyperglycemia: Secondary | ICD-10-CM | POA: Diagnosis not present

## 2024-08-01 DIAGNOSIS — F32A Depression, unspecified: Secondary | ICD-10-CM

## 2024-08-01 DIAGNOSIS — Z0100 Encounter for examination of eyes and vision without abnormal findings: Secondary | ICD-10-CM | POA: Diagnosis not present

## 2024-08-01 DIAGNOSIS — Z23 Encounter for immunization: Secondary | ICD-10-CM

## 2024-08-01 DIAGNOSIS — E785 Hyperlipidemia, unspecified: Secondary | ICD-10-CM

## 2024-08-01 DIAGNOSIS — K219 Gastro-esophageal reflux disease without esophagitis: Secondary | ICD-10-CM

## 2024-08-01 DIAGNOSIS — M1990 Unspecified osteoarthritis, unspecified site: Secondary | ICD-10-CM

## 2024-08-01 DIAGNOSIS — I10 Essential (primary) hypertension: Secondary | ICD-10-CM | POA: Diagnosis not present

## 2024-08-01 DIAGNOSIS — Z1321 Encounter for screening for nutritional disorder: Secondary | ICD-10-CM

## 2024-08-01 DIAGNOSIS — Z7985 Long-term (current) use of injectable non-insulin antidiabetic drugs: Secondary | ICD-10-CM

## 2024-08-01 DIAGNOSIS — E119 Type 2 diabetes mellitus without complications: Secondary | ICD-10-CM

## 2024-08-01 MED ORDER — DULAGLUTIDE 0.75 MG/0.5ML ~~LOC~~ SOAJ: 0.7500 mg | SUBCUTANEOUS | 0 refills | Status: DC

## 2024-08-01 MED ORDER — AMLODIPINE BESYLATE 5 MG PO TABS: 5.0000 mg | ORAL_TABLET | Freq: Every day | ORAL | 0 refills | Status: DC

## 2024-08-01 MED ORDER — OMEPRAZOLE 40 MG PO CPDR: 40.0000 mg | DELAYED_RELEASE_CAPSULE | Freq: Every day | ORAL | 0 refills | Status: DC

## 2024-08-01 MED ORDER — ATORVASTATIN CALCIUM 20 MG PO TABS
20.0000 mg | ORAL_TABLET | Freq: Every day | ORAL | 0 refills | Status: DC
Start: 2024-08-01 — End: 2024-08-03

## 2024-08-01 MED ORDER — MELOXICAM 7.5 MG PO TABS: 7.5000 mg | ORAL_TABLET | Freq: Every day | ORAL | 0 refills | Status: DC

## 2024-08-01 MED ORDER — CITALOPRAM HYDROBROMIDE 20 MG PO TABS: 20.0000 mg | ORAL_TABLET | Freq: Every day | ORAL | 0 refills | Status: DC

## 2024-08-01 MED ORDER — HYDROXYZINE PAMOATE 25 MG PO CAPS: 25.0000 mg | ORAL_CAPSULE | Freq: Three times a day (TID) | ORAL | 1 refills | Status: DC | PRN

## 2024-08-01 NOTE — Progress Notes (Signed)
 Patient ID: [name], female    DOB: [birth year]  MRN: [id number]  CC: Chronic Conditions Follow-Up  Subjective: [name] is a 53 y.o. female who presents for chronic conditions follow-up.   Her concerns today include:  - Doing well on Amlodipine , no issues/concerns. She does not complain of red flag symptoms such as but not limited to chest pain, shortness of breath, worst headache of life, nausea/vomiting.  - Doing well on Dulaglutide , no issues/concerns. Denies red flag symptoms associated with diabetes.  - Due for diabetic eye exam.  - Due for diabetic foot exam. - Doing well on Atorvastatin , no issues/concerns.  - Doing well on Omeprazole , no issues/concerns.  - Doing well on Citalopram  and Hydroxyzine , no issues/concerns. She denies thoughts of self-harm, suicidal ideations, homicidal ideations. - Arthritis. Over-the-counter medications not helping.  - Vitamin D  lab.  Patient Active Problem List   Diagnosis Date Noted   Adjustment disorder with mixed anxiety and depressed mood 04/19/2023   Hyperlipidemia 11/16/2022   Diabetes mellitus, type 2 (HCC) 11/16/2022   Trochanteric bursitis, left hip 09/03/2022   Asthmatic bronchitis with acute exacerbation 04/09/2022   Chronic cough 01/27/2022   Right thyroid nodule 01/07/2022   Essential hypertension 01/05/2022   Migraine 01/05/2022   Thyroid nodule greater than or equal to 1.5 cm in diameter incidentally noted on imaging study 12/01/2021   Grief 02/06/2021   Non-intractable vomiting 02/06/2021   Arthritis of right knee    S/P knee replacement 01/06/2021   Leiomyoma of body of uterus 12/24/2016   Prediabetes 11/11/2016   Pap smear abnormality of cervix with ASCUS favoring benign 01/01/2016   COPD (chronic obstructive pulmonary disease) (HCC) 11/18/2015   GERD (gastroesophageal reflux disease) 11/18/2015   Abnormal imaging of thyroid 11/04/2015   COPD exacerbation (HCC) 11/01/2015   Elevated d-dimer 11/01/2015    IDA (iron  deficiency anemia) 09/04/2015   Patellofemoral disorder 04/04/2015   Headache disorder 07/30/2014   Insomnia 04/09/2014   Cervical spondylosis with radiculopathy 03/25/2014   S/P cervical spinal fusion 03/06/2014   Class 2 severe obesity due to excess calories with serious comorbidity and body mass index (BMI) of 37.0 to 37.9 in adult 02/22/2014   Brachial radiculitis 01/29/2014   Radicular pain in right arm 01/11/2014   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   Other and unspecified hyperlipidemia 08/24/2013   History of CVA (cerebrovascular accident) 05/18/2013   Tobacco abuse, 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     Current Outpatient Medications on File Prior to Visit  Medication Sig Dispense Refill   albuterol  (VENTOLIN  HFA) 108 (90 Base) MCG/ACT inhaler Inhale 2 puffs into the lungs every 6 (six) hours as needed for wheezing or shortness of breath. 8 g 5   amLODipine  (NORVASC ) 5 MG tablet Take 1 tablet (5 mg total) by mouth daily. 90 tablet 0   budesonide  (RHINOCORT  AQUA) 32 MCG/ACT nasal spray Place 1 spray into both nostrils daily. 8.43 mL 2   citalopram  (CELEXA ) 10 MG tablet Take 1 tablet by mouth once daily 90 tablet 0   doxepin  (SINEQUAN ) 50 MG capsule Take 1 capsule (50 mg total) by mouth at bedtime. 30 capsule 1   gabapentin  (NEURONTIN ) 100 MG capsule Take 1 capsule (100 mg total) by mouth 4 (four) times daily. 120 capsule 0   promethazine  (PHENERGAN ) 12.5 MG tablet Take 1 tablet (12.5 mg total) by mouth every 8 (eight) hours as needed for up to 5 days for nausea or vomiting. 15 tablet 0  No current facility-administered medications on file prior to visit.    Allergies  Allergen Reactions   Shellfish Allergy  Itching and Swelling   Latex Itching and Rash    Social History   Socioeconomic History   Marital status: Divorced    Spouse name: Not on file   Number of children: 7   Years of education: GEd   Highest education level: GED or equivalent  Occupational  History   Occupation: Scientist, research (medical): [employer]  Tobacco Use   Smoking status: Former    Current packs/day: 0.00    Average packs/day: 0.5 packs/day for 26.0 years (13.0 ttl pk-yrs)    Types: Cigarettes    Start date: 10/08/1989    Quit date: 10/09/2015    Years since quitting: 8.8    Passive exposure: Past   Smokeless tobacco: Former  Building services engineer status: Never Used  Substance and Sexual Activity   Alcohol  use: Yes    Comment: occasionally   Drug use: No   Sexual activity: Not Currently    Birth control/protection: Surgical  Other Topics Concern   Not on file  Social History Narrative   Patient lives at home with her family    She has 7 children   5 grown and out of the house   2 at home 30 and 14   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 [employer] Strain: Medium Risk (06/13/2024)   Overall Financial Resource Strain (CARDIA)    Difficulty of Paying Living Expenses: Somewhat hard  Food Insecurity: Food Insecurity Present (06/13/2024)   Hunger Vital Sign    Worried About Running Out of Food in the Last Year: Sometimes true    Ran Out of Food in the Last Year: Sometimes true  Transportation Needs: No Transportation Needs (06/13/2024)   PRAPARE - Administrator, Civil Service (Medical): No    Lack of Transportation (Non-Medical): No  Physical Activity: Unknown (06/13/2024)   Exercise Vital Sign    Days of Exercise per Week: Patient declined    Minutes of Exercise per Session: Not on file  Recent Concern: Physical Activity - Insufficiently Active (05/02/2024)   Exercise Vital Sign    Days of Exercise per Week: 2 days    Minutes of Exercise per Session: 40 min  Stress: Stress Concern Present (06/13/2024)   [employer] of Occupational Health - Occupational Stress Questionnaire    Feeling of Stress: To some extent  Social Connections: Socially Isolated (06/13/2024)   Social Connection and Isolation Panel    Frequency  of Communication with Friends and Family: Once a week    Frequency of Social Gatherings with Friends and Family: Once a week    Attends Religious Services: 1 to 4 times per year    Active Member of [employer] or Organizations: No    Attends Engineer, structural: Not on file    Marital Status: Divorced  Intimate Partner Violence: Not At Risk (05/02/2024)   Humiliation, Afraid, Rape, and Kick questionnaire    Fear of Current or Ex-Partner: No    Emotionally Abused: No    Physically Abused: No    Sexually Abused: No    Family History  Problem Relation Age of Onset   Renal Disease Father        dialysis   Hypertension Father    Heart disease Mother    Heart disease Maternal Grandfather  Asthma Sister    Asthma Maternal Aunt     Past Surgical History:  Procedure Laterality Date   ANTERIOR CERVICAL DECOMP/DISCECTOMY FUSION N/A 03/06/2014   Procedure: ANTERIOR CERVICAL DECOMPRESSION/DISCECTOMY FUSION 2 LEVELS four/five, five/six;  Surgeon: Alm GORMAN Molt, MD;  Location: [facility] NEURO ORS;  Service: Neurosurgery;  Laterality: N/A;   CESAREAN SECTION     x 1 with 5th pregnancy   CHOLECYSTECTOMY     KNEE ARTHROSCOPY Right 2015   TEE WITHOUT CARDIOVERSION N/A 05/21/2013   Procedure: TRANSESOPHAGEAL ECHOCARDIOGRAM (TEE);  Surgeon: Wilbert JONELLE Bihari, MD;  Location[facility] ENDOSCOPY;  Service: Cardiovascular;  Laterality: N/A;   TOTAL KNEE ARTHROPLASTY Right 01/06/2021   TOTAL KNEE ARTHROPLASTY Right 01/06/2021   Procedure: RIGHT TOTAL KNEE ARTHROPLASTY;  Surgeon: Addie Cordella Hamilton, MD;  Location: [facility] OR;  Service: Orthopedics;  Laterality: Right;   TUBAL LIGATION      ROS: Review of Systems Negative except as stated above  PHYSICAL EXAM: BP 127/86   Pulse 82   Temp 98.4 F (36.9 C) (Oral)   Resp 16   Ht 5' 3 (1.6 m)   Wt 176 lb 3.2 oz (79.9 kg)   SpO2 92%   BMI 31.21 kg/m   Physical Exam HENT:     Head: Normocephalic and atraumatic.     Nose: Nose normal.     Mouth/Throat:      Mouth: Mucous membranes are moist.     Pharynx: Oropharynx is clear.  Eyes:     Extraocular Movements: Extraocular movements intact.     Conjunctiva/sclera: Conjunctivae normal.     Pupils: Pupils are equal, round, and reactive to light.  Cardiovascular:     Rate and Rhythm: Normal rate and regular rhythm.     Pulses: Normal pulses.     Heart sounds: Normal heart sounds.  Pulmonary:     Effort: Pulmonary effort is normal.     Breath sounds: Normal breath sounds.  Musculoskeletal:        General: Normal range of motion.     Cervical back: Normal range of motion and neck supple.  Neurological:     General: No focal deficit present.     Mental Status: She is alert and oriented to person, place, and time.  Psychiatric:        Mood and Affect: Mood normal.        Behavior: Behavior normal.     ASSESSMENT AND PLAN: 1. Primary hypertension (Primary) - Continue Amlodipine  as prescribed.  - Routine screening.  - Counseled on blood pressure goal of less than 130/80, low-sodium, DASH diet, medication compliance, and 150 minutes of moderate intensity exercise per week as tolerated. Counseled on medication adherence and adverse effects. - Follow-up with primary provider in 3 months or sooner if needed.  - Basic Metabolic Panel - amLODipine  (NORVASC ) 5 MG tablet; Take 1 tablet (5 mg total) by mouth daily.  Dispense: 90 tablet; Refill: 0  2. Type 2 diabetes mellitus with hyperglycemia, without long-term current use of insulin (HCC) - Hemoglobin A1c result pending.  - Continue Dulaglutide  as prescribed.  - Discussed the importance of healthy eating habits, low-carbohydrate diet, low-sugar diet, regular aerobic exercise (at least 150 minutes a week as tolerated) and medication compliance to achieve or maintain control of diabetes. Counseled on medication adherence/adverse effects. - Follow-up with primary provider as scheduled.  - Hemoglobin A1c - Dulaglutide  0.75 MG/0.5ML SOAJ; Inject 0.75 mg  into the skin once a week.  Dispense: 6 mL; Refill: 0  3. Diabetic eye exam [facility]) - Referral to Ophthalmology for evaluation/management. - Ambulatory referral to Ophthalmology  4. Encounter for diabetic foot exam [facility]) - Referral to Podiatry for evaluation/management.  - Ambulatory referral to Podiatry  5. Hyperlipidemia, unspecified hyperlipidemia type - Continue Atorvastatin  as prescribed. Counseled on medication adherence/adverse effects.  - Routine screening.  - Follow-up with primary provider as scheduled.  - atorvastatin  (LIPITOR) 20 MG tablet; Take 1 tablet (20 mg total) by mouth daily.  Dispense: 90 tablet; Refill: 0 - Lipid panel  6. Gastroesophageal reflux disease, unspecified whether esophagitis present - Continue Omeprazole  as prescribed. Counseled on medication adherence/adverse effects.  - Follow-up with primary provider as scheduled.  - omeprazole  (PRILOSEC) 40 MG capsule; Take 1 capsule (40 mg total) by mouth daily.  Dispense: 90 capsule; Refill: 0  7. Anxiety and depression - Patient denies thoughts of self-harm, suicidal ideations, homicidal ideations. - Continue Citalopram  and Hydroxyzine  as prescribed. Counseled on medication adherence/adverse effects.  - Follow-up with primary provider in 3 months or sooner if needed.  - citalopram  (CELEXA ) 20 MG tablet; Take 1 tablet (20 mg total) by mouth daily.  Dispense: 90 tablet; Refill: 0 - hydrOXYzine  (VISTARIL ) 25 MG capsule; Take 1 capsule (25 mg total) by mouth every 8 (eight) hours as needed.  Dispense: 90 capsule; Refill: 1  8. Arthritis - Meloxicam  as prescribed. Counseled on medication adherence/adverse effects.  - Follow-up with primary provider as scheduled. - meloxicam  (MOBIC ) 7.5 MG tablet; Take 1 tablet (7.5 mg total) by mouth daily.  Dispense: 90 tablet; Refill: 0  9. Encounter for vitamin deficiency screening - Routine screening.  - Vitamin D , 25-hydroxy  10. Immunization due - Administered.  - Flu  vaccine trivalent PF, 6mos and older(Flulaval,Afluria,Fluarix,Fluzone)   Patient was given the opportunity to ask questions.  Patient verbalized understanding of the plan and was able to repeat key elements of the plan. Patient was given clear instructions to go to Emergency Department or return to medical center if symptoms don't improve, worsen, or new problems develop.The patient verbalized understanding.   Orders Placed This Encounter  Procedures   Flu vaccine trivalent PF, 6mos and older(Flulaval,Afluria,Fluarix,Fluzone)   Vitamin D , 25-hydroxy   Hemoglobin A1c   Basic Metabolic Panel   Lipid panel   Ambulatory referral to Ophthalmology   Ambulatory referral to Podiatry     Requested Prescriptions   Signed Prescriptions Disp Refills   meloxicam  (MOBIC ) 7.5 MG tablet 90 tablet 0    Sig: Take 1 tablet (7.5 mg total) by mouth daily.   amLODipine  (NORVASC ) 5 MG tablet 90 tablet 0    Sig: Take 1 tablet (5 mg total) by mouth daily.   Dulaglutide  0.75 MG/0.5ML SOAJ 6 mL 0    Sig: Inject 0.75 mg into the skin once a week.   atorvastatin  (LIPITOR) 20 MG tablet 90 tablet 0    Sig: Take 1 tablet (20 mg total) by mouth daily.   citalopram  (CELEXA ) 20 MG tablet 90 tablet 0    Sig: Take 1 tablet (20 mg total) by mouth daily.   hydrOXYzine  (VISTARIL ) 25 MG capsule 90 capsule 1    Sig: Take 1 capsule (25 mg total) by mouth every 8 (eight) hours as needed.   omeprazole  (PRILOSEC) 40 MG capsule 90 capsule 0    Sig: Take 1 capsule (40 mg total) by mouth daily.    Return in about 3 months (around 10/31/2024) for Follow-Up or next available chronic conditions.  Greig JINNY Drones, NP

## 2024-08-01 NOTE — Progress Notes (Signed)
 Wants to know if you can go up on her Trulicity  dose

## 2024-08-02 LAB — BASIC METABOLIC PANEL WITH GFR
BUN/Creatinine Ratio: 15 (ref 9–23)
BUN: 13 mg/dL (ref 6–24)
CO2: 20 mmol/L (ref 20–29)
Calcium: 10.3 mg/dL — ABNORMAL HIGH (ref 8.7–10.2)
Chloride: 107 mmol/L — ABNORMAL HIGH (ref 96–106)
Creatinine, Ser: 0.87 mg/dL (ref 0.57–1.00)
Glucose: 87 mg/dL (ref 70–99)
Potassium: 4.6 mmol/L (ref 3.5–5.2)
Sodium: 148 mmol/L — ABNORMAL HIGH (ref 134–144)
eGFR: 79 mL/min/1.73 (ref >59)

## 2024-08-02 LAB — LIPID PANEL
Chol/HDL Ratio: 3.2 ratio (ref 0.0–4.4)
Cholesterol, Total: 272 mg/dL — ABNORMAL HIGH (ref 100–199)
HDL: 85 mg/dL (ref >39)
LDL Chol Calc (NIH): 173 mg/dL — ABNORMAL HIGH (ref 0–99)
Triglycerides: 87 mg/dL (ref 0–149)
VLDL Cholesterol Cal: 14 mg/dL (ref 5–40)

## 2024-08-02 LAB — HEMOGLOBIN A1C
Est. average glucose Bld gHb Est-mCnc: 123 mg/dL
Hgb A1c MFr Bld: 5.9 % — ABNORMAL HIGH (ref 4.8–5.6)

## 2024-08-02 LAB — VITAMIN D 25 HYDROXY (VIT D DEFICIENCY, FRACTURES): Vit D, 25-Hydroxy: 22 ng/mL — ABNORMAL LOW (ref 30.0–100.0)

## 2024-08-03 ENCOUNTER — Ambulatory Visit: Payer: Self-pay | Admitting: Family

## 2024-08-03 ENCOUNTER — Telehealth: Payer: Self-pay

## 2024-08-03 DIAGNOSIS — E785 Hyperlipidemia, unspecified: Secondary | ICD-10-CM

## 2024-08-03 MED ORDER — ATORVASTATIN CALCIUM 40 MG PO TABS: 40.0000 mg | ORAL_TABLET | Freq: Every day | ORAL | 0 refills | Status: DC

## 2024-08-03 NOTE — Telephone Encounter (Signed)
 Called to get patient rescheduled per provider. She can be rescheduled for 9/30 in the afternoon or a different day.

## 2024-08-07 ENCOUNTER — Ambulatory Visit: Payer: PRIVATE HEALTH INSURANCE

## 2024-08-07 ENCOUNTER — Ambulatory Visit: Payer: PRIVATE HEALTH INSURANCE | Admitting: Nurse Practitioner

## 2024-08-07 ENCOUNTER — Encounter: Payer: Self-pay | Admitting: Nurse Practitioner

## 2024-08-07 VITALS — BP 138/88 | HR 77 | Temp 97.0°F

## 2024-08-07 DIAGNOSIS — Z91013 Allergy to seafood: Secondary | ICD-10-CM

## 2024-08-07 DIAGNOSIS — Z789 Other specified health status: Secondary | ICD-10-CM

## 2024-08-07 MED ORDER — EPINEPHRINE 0.3 MG/0.3ML IJ SOAJ: 0.3000 mg | INTRAMUSCULAR | 1 refills | Status: AC | PRN

## 2024-08-07 NOTE — Progress Notes (Signed)
 Occupational [facility]- [facility]  Subjective:  Patient ID: [name], female    DOB: 04/04/1971  Age: 53 y.o. MRN: [id number]  CC: wellness exam   HPI [name] presents for wellness exam visit for insurance benefit.  Patient has a PCP: [name]  PMH significant for: HTN, T2DM  Last labs per PCP were completed: Sept 2025  Health Maintenance:  Colonoscopy: declines  Mammogram: declines  Pap: declines     Smoker: former, quit 9 years ago.   Immunizations:  Shingrix-  x1, last year.  Flu-completed this year.  COVID booster- yearly. Still unsure if she will get it this year.    Lifestyle: Diet- No particular diet. Exercise- Very active at work      Past Medical History:  Diagnosis Date   Anemia    Anxiety    Arthritis    right knee, back   COPD (chronic obstructive pulmonary disease) (HCC)    GERD (gastroesophageal reflux disease)    Headache(784.0)    History of blood transfusion    Hx; of in 1991 after delivery   History of TIAs    Hypertension    Numbness    Right hand   Pneumonia    Stroke (HCC) 05/2013    Past Surgical History:  Procedure Laterality Date   ANTERIOR CERVICAL DECOMP/DISCECTOMY FUSION N/A 03/06/2014   Procedure: ANTERIOR CERVICAL DECOMPRESSION/DISCECTOMY FUSION 2 LEVELS four/five, five/six;  Surgeon: Dawn GORMAN Molt, MD;  Location: [facility] NEURO ORS;  Service: Neurosurgery;  Laterality: N/A;   CESAREAN SECTION     x 1 with 5th pregnancy   CHOLECYSTECTOMY     KNEE ARTHROSCOPY Right 2015   TEE WITHOUT CARDIOVERSION N/A 05/21/2013   Procedure: TRANSESOPHAGEAL ECHOCARDIOGRAM (TEE);  Surgeon: Dawn JONELLE Bihari, MD;  Location: [facility] ENDOSCOPY;  Service: Cardiovascular;  Laterality: N/A;   TOTAL KNEE ARTHROPLASTY Right 01/06/2021   TOTAL KNEE ARTHROPLASTY Right 01/06/2021   Procedure: RIGHT TOTAL KNEE ARTHROPLASTY;  Surgeon: Dawn Cordella Hamilton, MD;  Location: [facility];  Service: Orthopedics;  Laterality: Right;   TUBAL LIGATION      Outpatient  Medications Prior to Visit  Medication Sig Dispense Refill   albuterol  (VENTOLIN  HFA) 108 (90 Base) MCG/ACT inhaler Inhale 2 puffs into the lungs every 6 (six) hours as needed for wheezing or shortness of breath. 8 g 5   amLODipine  (NORVASC ) 5 MG tablet Take 1 tablet (5 mg total) by mouth daily. 90 tablet 0   atorvastatin  (LIPITOR) 40 MG tablet Take 1 tablet (40 mg total) by mouth daily. 90 tablet 0   budesonide  (RHINOCORT  AQUA) 32 MCG/ACT nasal spray Place 1 spray into both nostrils daily. 8.43 mL 2   citalopram  (CELEXA ) 20 MG tablet Take 1 tablet (20 mg total) by mouth daily. 90 tablet 0   doxepin  (SINEQUAN ) 50 MG capsule Take 1 capsule (50 mg total) by mouth at bedtime. 30 capsule 1   Dulaglutide  0.75 MG/0.5ML SOAJ Inject 0.75 mg into the skin once a week. 6 mL 0   hydrOXYzine  (VISTARIL ) 25 MG capsule Take 1 capsule (25 mg total) by mouth every 8 (eight) hours as needed. 90 capsule 1   meloxicam  (MOBIC ) 7.5 MG tablet Take 1 tablet (7.5 mg total) by mouth daily. 90 tablet 0   omeprazole  (PRILOSEC) 40 MG capsule Take 1 capsule (40 mg total) by mouth daily. 90 capsule 0   amLODipine  (NORVASC ) 5 MG tablet Take 1 tablet (5 mg total) by mouth daily. 90 tablet 0   gabapentin  (  NEURONTIN ) 100 MG capsule Take 1 capsule (100 mg total) by mouth 4 (four) times daily. (Patient not taking: Reported on 08/07/2024) 120 capsule 0   promethazine  (PHENERGAN ) 12.5 MG tablet Take 1 tablet (12.5 mg total) by mouth every 8 (eight) hours as needed for up to 5 days for nausea or vomiting. (Patient not taking: Reported on 08/07/2024) 15 tablet 0   citalopram  (CELEXA ) 10 MG tablet Take 1 tablet by mouth once daily (Patient not taking: Reported on 08/07/2024) 90 tablet 0   No facility-administered medications prior to visit.    ROS Review of Systems  HENT:  Negative for hearing loss.   Eyes:  Negative for visual disturbance.  Respiratory:  Negative for shortness of breath.   Cardiovascular:  Negative for chest pain.   Gastrointestinal:  Negative for constipation, diarrhea, nausea and vomiting.  Musculoskeletal:  Positive for arthralgias and back pain.  Neurological:  Negative for dizziness and headaches.  Psychiatric/Behavioral:  Positive for sleep disturbance (wakes up seconday to pain.).     Objective:  BP 138/88 (BP Location: Right Arm, Patient Position: Sitting, Cuff Size: Normal)   Pulse 77   Temp (!) 97 F (36.1 C) (Temporal)   SpO2 97%   Physical Exam Constitutional:      General: She is not in acute distress. HENT:     Head: Normocephalic.     Right Ear: Tympanic membrane, ear canal and external ear normal.     Left Ear: Tympanic membrane, ear canal and external ear normal.     Mouth/Throat:     Mouth: Mucous membranes are moist.     Pharynx: No oropharyngeal exudate or posterior oropharyngeal erythema.  Eyes:     Pupils: Pupils are equal, round, and reactive to light.  Cardiovascular:     Rate and Rhythm: Normal rate and regular rhythm.     Heart sounds: Normal heart sounds.  Pulmonary:     Effort: Pulmonary effort is normal.     Breath sounds: Normal breath sounds.  Abdominal:     Palpations: Abdomen is soft.  Musculoskeletal:        General: Normal range of motion.     Right lower leg: No edema.     Left lower leg: No edema.  Skin:    General: Skin is warm.  Neurological:     General: No focal deficit present.     Mental Status: She is alert and oriented to person, place, and time.  Psychiatric:        Mood and Affect: Mood normal.        Behavior: Behavior normal.      Assessment & Plan:    [name] was seen today for wellness exam.  Diagnoses and all orders for this visit:  Participant in health and wellness plan Adult wellness physical was conducted today. Importance of diet and exercise were discussed in detail.  We reviewed immunizations and gave recommendations regarding current immunization needed for age.  Preventative health exams needed: Colonoscopy,  mammogram, and pap all declined at this time.   Patient was advised yearly wellness exam and follow-up with PCP as scheduled.   Shellfish allergy  -     EPINEPHrine  0.3 mg/0.3 mL IJ SOAJ injection; Inject 0.3 mg into the muscle as needed for anaphylaxis.    No orders of the defined types were placed in this encounter.   Meds ordered this encounter  Medications   EPINEPHrine  0.3 mg/0.3 mL IJ SOAJ injection    Sig: Inject 0.3 mg  into the muscle as needed for anaphylaxis.    Dispense:  1 each    Refill:  1    Supervising Provider:   BACIGALUPO, ANGELA M [[id number]]    Follow-up: as needed.

## 2024-09-12 ENCOUNTER — Ambulatory Visit: Payer: PRIVATE HEALTH INSURANCE | Admitting: Orthopedic Surgery

## 2024-09-26 ENCOUNTER — Other Ambulatory Visit: Payer: Self-pay

## 2024-09-26 ENCOUNTER — Ambulatory Visit (INDEPENDENT_AMBULATORY_CARE_PROVIDER_SITE_OTHER): Payer: PRIVATE HEALTH INSURANCE | Admitting: Orthopedic Surgery

## 2024-09-26 ENCOUNTER — Other Ambulatory Visit (INDEPENDENT_AMBULATORY_CARE_PROVIDER_SITE_OTHER): Payer: PRIVATE HEALTH INSURANCE

## 2024-09-26 DIAGNOSIS — M5416 Radiculopathy, lumbar region: Secondary | ICD-10-CM | POA: Diagnosis not present

## 2024-09-26 DIAGNOSIS — M25561 Pain in right knee: Secondary | ICD-10-CM | POA: Diagnosis not present

## 2024-09-26 DIAGNOSIS — M25461 Effusion, right knee: Secondary | ICD-10-CM

## 2024-09-26 DIAGNOSIS — M79605 Pain in left leg: Secondary | ICD-10-CM | POA: Diagnosis not present

## 2024-09-26 NOTE — Progress Notes (Unsigned)
 Office Visit Note   Patient: [name]           Date of Birth: Jan 04, 1971           MRN: [id number] Visit Date: 09/26/2024 Requested by: Jaycee Greig PARAS, NP [address] [place],  [place] [address] PCP: Jaycee Greig PARAS, NP  Subjective: Chief Complaint  Patient presents with   Left Leg - Pain   Right Knee - Pain    HPI: BRANDOLYN Rowland is a 53 y.o. female who presents to the office reporting left-sided upper thigh and knee pain.  Radicular type pain with no numbness and tingling.  Denies any groin pain.  The pain does wake her from sleep at night.  Patient also reports some right knee pain.  Has a history of right total knee replacement 3 years ago.  She states that her left upper thigh hurts her without her actually walking on it.  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  She has had lumbar spine ESI x 4 with Dr. Eldonna in 2024 with not much relief per patient.  Patient states her right knee has been having some swelling over the past 2 weeks.  Denies any fevers or chills.  She is on it now more than usual because she is working in a different building.  Reports a little bit of trochanteric tenderness with pressure but she also describes some occasional radiation of her symptoms into the left foot left side more than right side..                ROS: All systems reviewed are negative as they relate to the chief complaint within the history of present illness.  Patient denies fevers or chills.  Assessment & Plan: Visit Diagnoses:  1. Pain and swelling of right knee   2. Pain in left leg   3. Lumbar radiculopathy     Plan: Impression is back mediated symptoms.  The knee has no warmth or effusion and excellent range of motion.  Radiographs look good.  Nothing really actionable on the right knee.  Regarding that left upper thigh and knee pain I think this is likely related to the severe facet arthritis she has at L3-4 as well as one of the level.   I would like for her to start physical therapy 1 time a week for the next 6 to 8 weeks for low back pain and facet arthritis.  Hinged knee brace for the right knee.  Refer to Dr. Georgina in 6 weeks for surgical consideration.  The pain is at the level now that she would consider surgical intervention because of the interference in her activities of daily living.  Follow-Up Instructions: No follow-ups on file.   Orders:  Orders Placed This Encounter  Procedures   XR HIP UNILAT W OR W/O PELVIS 2-3 VIEWS LEFT   XR Knee 1-2 Views Right   XR Lumbar Spine 2-3 Views   Ambulatory referral to Physical Therapy   Ambulatory referral to Orthopedic Surgery   No orders of the defined types were placed in this encounter.     Procedures: No procedures performed   Clinical Data: No additional findings.  Objective: Vital Signs: There were no vitals taken for this visit.  Physical Exam:  Constitutional: Patient appears well-developed HEENT:  Head: Normocephalic Eyes:EOM are normal Neck: Normal range of motion Cardiovascular: Normal rate Pulmonary/chest: Effort normal Neurologic: Patient is alert Skin: Skin is  warm Psychiatric: Patient has normal mood and affect  Ortho Exam: Ortho exam demonstrates no warmth or effusion in the right knee.  Extensor mechanism intact.  Range of motion is 0-1 15.  Good stability to varus and valgus stress.  No nerve root tension signs on the right.  On the left-hand side she has mildly positive nerve root tension signs on the left which are more femoral stretch type symptoms.  Has 5 out of 5 ankle dorsiflexion plantarflexion quad and hamstring strength but no definite paresthesias L1-S1 bilaterally.  Pedal pulses palpable.  No groin pain with internal/external rotation of either leg.  Not too much focal trochanteric tenderness on the left or right-hand side.  Does have some pain with forward and lateral bending.  Specialty Comments:  MRI CERVICAL SPINE WITHOUT  CONTRAST   TECHNIQUE: Multiplanar, multisequence MR imaging of the cervical spine was performed. No intravenous contrast was administered.   COMPARISON:  Cervical spine MRI 01/18/2014. Cervical spine radiographs 03/15/2022.   FINDINGS: Alignment: Chronic straightening of cervical lordosis, less reversal of lordosis compared to the preoperative MRI in 2015.   Vertebrae: ACDF hardware artifact now C4-C5 and C5-C6. Mild degenerative endplate marrow edema eccentric to the left at the adjacent C6-C7 segment (series 4, image 11). Background bone marrow signal within normal limits. No other No acute osseous abnormality identified.   Cord: No spinal cord signal abnormality despite degenerative cord mass effect detailed below. Negative visible upper thoracic canal and cord.   Posterior Fossa, vertebral arteries, paraspinal tissues: Cervicomedullary junction is within normal limits. Partially empty sella is chronic. Negative visible posterior fossa. Preserved major vascular flow voids in the neck. Right vertebral artery appears dominant as in 2015. Negative visible neck soft tissues and lung apices.   Disc levels:   C2-C3: Chronic disc bulging and endplate spurring eccentric to the right with moderate right side facet and ligament flavum hypertrophy. Borderline spinal stenosis now at this level. Moderate to severe right C3 foraminal stenosis has also progressed.   C3-C4: Progressed disc space loss with circumferential disc bulge, anterior endplate spurring, and lobulated posterior and slightly cephalad extrusion of disc (greater on the right series 7, image 7). Mild to moderate facet and ligament flavum hypertrophy has increased on the right. New mild spinal stenosis and mild ventral cord mass effect (same image). Mild to moderate bilateral C4 foraminal stenosis appears increased on the right.   C4-C5:  Interval ACDF. Resolved stenosis.   C5-C6: Interval ACDF. No stenosis.  Residual endplate spurring on the left.   C6-C7: Interval disc space loss. Circumferential disc bulge and broad-based posterior component of disc (series 7, image 21). New mild spinal stenosis and ventral cord mass effect. Mild facet and ligament flavum hypertrophy. Mild to moderate bilateral C7 foraminal stenosis is new.   C7-T1:  Stable mild facet hypertrophy. No stenosis.   IMPRESSION: 1. ACDF C4-C5 and C5-C6 since a 2015 MRI with resolved stenosis at those levels.   2. But progressed degeneration at both adjacent segments: C3-C4 and C6-C7. New mild spinal stenosis AND spinal cord mass effect at both levels. No cord signal abnormality. Up to moderate bilateral foraminal stenosis at both levels.   3. Progressed C2-C3 degeneration with new borderline spinal stenosis and increased moderate to severe right C3 foraminal stenosis.     Electronically Signed   By: VEAR Hurst M.D.   On: 03/30/2022 15:42  Imaging: No results found.   PMFS History: Patient Active Problem List   Diagnosis Date Noted  Adjustment disorder with mixed anxiety and depressed mood 04/19/2023   Hyperlipidemia 11/16/2022   Diabetes mellitus, type 2 (HCC) 11/16/2022   Trochanteric bursitis, left hip 09/03/2022   Asthmatic bronchitis with acute exacerbation 04/09/2022   Chronic cough 01/27/2022   Right thyroid nodule 01/07/2022   Essential hypertension 01/05/2022   Migraine 01/05/2022   Thyroid nodule greater than or equal to 1.5 cm in diameter incidentally noted on imaging study 12/01/2021   Grief 02/06/2021   Non-intractable vomiting 02/06/2021   Arthritis of right knee    S/P knee replacement 01/06/2021   Leiomyoma of body of uterus 12/24/2016   Prediabetes 11/11/2016   Pap smear abnormality of cervix with ASCUS favoring benign 01/01/2016   COPD (chronic obstructive pulmonary disease) (HCC) 11/18/2015   GERD (gastroesophageal reflux disease) 11/18/2015   Abnormal imaging of thyroid 11/04/2015    COPD exacerbation (HCC) 11/01/2015   Elevated d-dimer 11/01/2015   IDA (iron  deficiency anemia) 09/04/2015   Patellofemoral disorder 04/04/2015   Headache disorder 07/30/2014   Insomnia 04/09/2014   Cervical spondylosis with radiculopathy 03/25/2014   S/P cervical spinal fusion 03/06/2014   Class 2 severe obesity due to excess calories with serious comorbidity and body mass index (BMI) of 37.0 to 37.9 in adult 02/22/2014   Brachial radiculitis 01/29/2014   Radicular pain in right arm 01/11/2014   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   Other and unspecified hyperlipidemia 08/24/2013   History of CVA (cerebrovascular accident) 05/18/2013   Tobacco abuse, 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   Past Medical History:  Diagnosis Date   Anemia    Anxiety    Arthritis    right knee, back   COPD (chronic obstructive pulmonary disease) (HCC)    GERD (gastroesophageal reflux disease)    Headache(784.0)    History of blood transfusion    Hx; of in 1991 after delivery   History of TIAs    Hypertension    Numbness    Right hand   Pneumonia    Stroke (HCC) 05/2013    Family History  Problem Relation Age of Onset   Renal Disease Father        dialysis   Hypertension Father    Heart disease Mother    Heart disease Maternal Grandfather    Asthma Sister    Asthma Maternal Aunt     Past Surgical History:  Procedure Laterality Date   ANTERIOR CERVICAL DECOMP/DISCECTOMY FUSION N/A 03/06/2014   Procedure: ANTERIOR CERVICAL DECOMPRESSION/DISCECTOMY FUSION 2 LEVELS four/five, five/six;  Surgeon: Alm GORMAN Molt, MD;  Location: [facility] NEURO ORS;  Service: Neurosurgery;  Laterality: N/A;   CESAREAN SECTION     x 1 with 5th pregnancy   CHOLECYSTECTOMY     KNEE ARTHROSCOPY Right 2015   TEE WITHOUT CARDIOVERSION N/A 05/21/2013   Procedure: TRANSESOPHAGEAL ECHOCARDIOGRAM (TEE);  Surgeon: Wilbert JONELLE Bihari, MD;  Location: [facility] ENDOSCOPY;  Service: Cardiovascular;  Laterality: N/A;   TOTAL KNEE ARTHROPLASTY Right  01/06/2021   TOTAL KNEE ARTHROPLASTY Right 01/06/2021   Procedure: RIGHT TOTAL KNEE ARTHROPLASTY;  Surgeon: Addie Cordella Hamilton, MD;  Location: [facility] OR;  Service: Orthopedics;  Laterality: Right;   TUBAL LIGATION     Social History   Occupational History   Occupation: Scientist, Research (medical): [employer]  Tobacco Use   Smoking status: Former    Current packs/day: 0.00    Average packs/day: 0.5 packs/day for 26.0 years (13.0 ttl pk-yrs)    Types: Cigarettes    Start date: 10/08/1989  Quit date: 10/09/2015    Years since quitting: 8.9    Passive exposure: Past   Smokeless tobacco: Former  Building Services Engineer status: Never Used  Substance and Sexual Activity   Alcohol  use: Yes    Comment: occasionally   Drug use: No   Sexual activity: Not Currently    Birth control/protection: Surgical

## 2024-09-27 ENCOUNTER — Encounter: Payer: Self-pay | Admitting: Orthopedic Surgery

## 2024-10-13 ENCOUNTER — Other Ambulatory Visit: Payer: Self-pay | Admitting: Family

## 2024-10-13 DIAGNOSIS — K219 Gastro-esophageal reflux disease without esophagitis: Secondary | ICD-10-CM

## 2024-10-15 ENCOUNTER — Encounter: Payer: Self-pay | Admitting: Family

## 2024-10-15 ENCOUNTER — Other Ambulatory Visit: Payer: Self-pay | Admitting: Family

## 2024-10-15 DIAGNOSIS — K219 Gastro-esophageal reflux disease without esophagitis: Secondary | ICD-10-CM

## 2024-10-15 MED ORDER — OMEPRAZOLE 40 MG PO CPDR: 40.0000 mg | DELAYED_RELEASE_CAPSULE | Freq: Every day | ORAL | 0 refills | Status: DC

## 2024-10-15 NOTE — Telephone Encounter (Signed)
 Complete

## 2024-10-15 NOTE — Telephone Encounter (Signed)
 Omeprazole  prescribed (10/15/2024  3:22 PM EST).

## 2024-10-16 ENCOUNTER — Ambulatory Visit: Payer: PRIVATE HEALTH INSURANCE | Admitting: Physical Therapy

## 2024-10-17 NOTE — Telephone Encounter (Signed)
 Omeprazole  prescribed (10/15/2024  3:22 PM EST).

## 2024-10-18 ENCOUNTER — Encounter: Payer: Self-pay | Admitting: Physical Therapy

## 2024-10-18 ENCOUNTER — Ambulatory Visit: Payer: PRIVATE HEALTH INSURANCE | Admitting: Physical Therapy

## 2024-10-18 DIAGNOSIS — M25552 Pain in left hip: Secondary | ICD-10-CM | POA: Diagnosis not present

## 2024-10-18 DIAGNOSIS — M6281 Muscle weakness (generalized): Secondary | ICD-10-CM

## 2024-10-18 DIAGNOSIS — M5459 Other low back pain: Secondary | ICD-10-CM | POA: Diagnosis not present

## 2024-10-18 DIAGNOSIS — M25551 Pain in right hip: Secondary | ICD-10-CM | POA: Diagnosis not present

## 2024-10-18 NOTE — Therapy (Signed)
 OUTPATIENT PHYSICAL THERAPY EVALUATION   Patient Name: [name] MRN: [id number] DOB:19-Apr-1971, 53 y.o., female Today's Date: 10/18/2024  END OF SESSION:  PT End of Session - 10/18/24 0959     Visit Number 1    Number of Visits 6    Date for Recertification  11/29/24    Authorization Type [employer]    PT Start Time 0930    PT Stop Time 1000    PT Time Calculation (min) 30 min    Activity Tolerance Patient tolerated treatment well    Behavior During Therapy WFL for tasks assessed/performed          Past Medical History:  Diagnosis Date   Anemia    Anxiety    Arthritis    right knee, back   COPD (chronic obstructive pulmonary disease) (HCC)    GERD (gastroesophageal reflux disease)    Headache(784.0)    History of blood transfusion    Hx; of in 1991 after delivery   History of TIAs    Hypertension    Numbness    Right hand   Pneumonia    Stroke (HCC) 05/2013   Past Surgical History:  Procedure Laterality Date   ANTERIOR CERVICAL DECOMP/DISCECTOMY FUSION N/A 03/06/2014   Procedure: ANTERIOR CERVICAL DECOMPRESSION/DISCECTOMY FUSION 2 LEVELS four/five, five/six;  Surgeon: Alm GORMAN Molt, MD;  Location: [facility] NEURO ORS;  Service: Neurosurgery;  Laterality: N/A;   CESAREAN SECTION     x 1 with 5th pregnancy   CHOLECYSTECTOMY     KNEE ARTHROSCOPY Right 2015   TEE WITHOUT CARDIOVERSION N/A 05/21/2013   Procedure: TRANSESOPHAGEAL ECHOCARDIOGRAM (TEE);  Surgeon: Wilbert JONELLE Bihari, MD;  Location: [facility] ENDOSCOPY;  Service: Cardiovascular;  Laterality: N/A;   TOTAL KNEE ARTHROPLASTY Right 01/06/2021   TOTAL KNEE ARTHROPLASTY Right 01/06/2021   Procedure: RIGHT TOTAL KNEE ARTHROPLASTY;  Surgeon: Addie Cordella Hamilton, MD;  Location: [facility] OR;  Service: Orthopedics;  Laterality: Right;   TUBAL LIGATION     Patient Active Problem List   Diagnosis Date Noted   Adjustment disorder with mixed anxiety and depressed mood 04/19/2023   Hyperlipidemia 11/16/2022   Diabetes mellitus, type 2  (HCC) 11/16/2022   Trochanteric bursitis, left hip 09/03/2022   Asthmatic bronchitis with acute exacerbation 04/09/2022   Chronic cough 01/27/2022   Right thyroid nodule 01/07/2022   Essential hypertension 01/05/2022   Migraine 01/05/2022   Thyroid nodule greater than or equal to 1.5 cm in diameter incidentally noted on imaging study 12/01/2021   Grief 02/06/2021   Non-intractable vomiting 02/06/2021   Arthritis of right knee    S/P knee replacement 01/06/2021   Leiomyoma of body of uterus 12/24/2016   Prediabetes 11/11/2016   Pap smear abnormality of cervix with ASCUS favoring benign 01/01/2016   COPD (chronic obstructive pulmonary disease) (HCC) 11/18/2015   GERD (gastroesophageal reflux disease) 11/18/2015   Abnormal imaging of thyroid 11/04/2015   COPD exacerbation (HCC) 11/01/2015   Elevated d-dimer 11/01/2015   IDA (iron  deficiency anemia) 09/04/2015   Patellofemoral disorder 04/04/2015   Headache disorder 07/30/2014   Insomnia 04/09/2014   Cervical spondylosis with radiculopathy 03/25/2014   S/P cervical spinal fusion 03/06/2014   Class 2 severe obesity due to excess calories with serious comorbidity and body mass index (BMI) of 37.0 to 37.9 in adult 02/22/2014   Brachial radiculitis 01/29/2014   Radicular pain in right arm 01/11/2014   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   Other and unspecified hyperlipidemia 08/24/2013   History of CVA (  cerebrovascular accident) 05/18/2013   Tobacco abuse, 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    PCP: Jaycee Greig PARAS, NP  REFERRING PROVIDER: Addie Cordella Hamilton, MD  REFERRING DIAG: [phone] (ICD-10-CM) - Lumbar radiculopathy  Rationale for Evaluation and Treatment: Rehabilitation  THERAPY DIAG:  Pain of both hip joints - Plan: PT plan of care cert/re-cert  Muscle weakness (generalized) - Plan: PT plan of care cert/re-cert  Other low back pain - Plan: PT plan of care cert/re-cert  ONSET DATE: Rt x 1 year; Lt x 4 months   SUBJECTIVE:                                                                                                                                                                                            SUBJECTIVE STATEMENT: [name] is a 53 y.o. female who presents to the office reporting bil upper thigh and knee pain (Lt worse than Rt).  Radicular type pain with no numbness and tingling.  Denies any groin pain.  The pain does wake her from sleep at night as she has difficulty lying on either side.  Has had ESI x 4 without significant relief.  She was recently moved to a new unit at work and has to be walking more.  PERTINENT HISTORY:  C4-6 ACDF, Rt TKA, hx CVA (2014), HTN, COPD, anxiety, arthritis  PAIN:  Are you having pain? Yes: NPRS scale: 0 currently, up to 5/10 Pain location: bil lateral hips, radiates to feet Pain description: aching Aggravating factors: lying on either side, sitting (has to reposition) Relieving factors: repositioning  PRECAUTIONS:  None  RED FLAGS: None   WEIGHT BEARING RESTRICTIONS:  No  FALLS:  Has patient fallen in last 6 months? 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  DIAGNOSTIC FINDINGS:  MRI (Feb 2024): 1. Progressive now severe bilateral facet arthropathy at L3-L4 with new mild spinal canal and moderate left neuroforaminal stenosis.1. Progressive now severe bilateral facet arthropathy at L3-L4 with new mild spinal canal and moderate left neuroforaminal stenosis.  X-rays (Nov 2025): No  significant degenerative disc disease between the vertebral bodies.   Moderate facet arthritis is  present throughout the lumbar spine.  No acute  compression fractures.  PATIENT SURVEYS:  Patient-Specific Activity Scoring Scheme  0 represents unable  to perform. 10 represents able to perform at prior level. 0 1 2 3 4 5 6 7 8 9  10 (Date and Score)   Activity Eval     1. Lying on either side  0    2. Sitting 8    Score 4    Total score = sum of the activity scores/number of activities Minimum detectable change (90%CI) for average score = 2 points Minimum detectable change (90%CI) for single activity score = 3 points    COGNITIVE STATUS: Within functional limits for tasks assessed   SENSATION: WFL  POSTURE:  rounded shoulders, forward head, and increased lumbar lordosis  GAIT: 10/18/2024 Comments: independent   PALPATION: 10/18/2024 Pain L1-5; bil greater trochanter   LUMBAR ROM:   ROM A/PROM  eval  Flexion Limited 25%  Extension WNL  Right quadrant WNL with pain  Left quadrant WNL with pain   (Blank rows = not tested)     LOWER EXTREMITY MMT:    MMT Right eval Left eval  Hip flexion 5/5 5/5  Hip extension 4/5 4/5  Hip abduction 4/5 4/5  Knee flexion 5/5 5/5  Knee extension 5/5 5/5  Ankle dorsiflexion 5/5 5/5   (Blank rows = not tested)    SPECIAL TESTS:  10/18/2024 Lumbar Slump test: Negative   TREATMENT:                                                                                                                              DATE:  10/18/2024 TherEx See HEP - demonstrated with trial reps performed PRN, mod cues for comprehension    Self Care Educated on clinical findings and PT POC     PATIENT EDUCATION:  Education details: HEP Person educated: Patient Education method: Programmer, Multimedia, Facilities Manager, and Handouts Education comprehension: verbalized understanding, returned demonstration, and needs further education  HOME EXERCISE PROGRAM: Access Code: [email or web address] URL: [email or web address] Date:  10/18/2024 Prepared by: Corean Ku  Exercises - Supine Bridge  - 2 x daily - 7 x weekly - 2 sets - 10 reps - 5 sec hold - Standing Hip Extension with Resistance at Ankles and Counter Support  - 2 x daily - 7 x weekly - 2 sets - 10 reps - Standing Hip Abduction with Resistance at Ankles and Counter Support  - 2 x daily - 7 x weekly - 2 sets - 10 reps - Hooklying Single Knee to Chest  - 2 x daily - 7 x weekly - 1 sets - 3 reps - 30 sec hold - Supine Piriformis Stretch with Foot on Ground  - 2 x daily - 7 x weekly - 1 sets - 3 reps - 30 sec hold - Supine Lower Trunk Rotation  - 2 x daily - 7 x weekly - 1 sets - 3 reps - 30 sec hold   ASSESSMENT:  CLINICAL IMPRESSION: Patient is a 53 y.o. female who was seen today for physical therapy evaluation and treatment for  LBP. She demonstrates mild strength and ROM deficits, as well as continued pain affecting functional mobility.  She will benefit from PT to address deficits listed.     OBJECTIVE IMPAIRMENTS: decreased mobility, difficulty walking, decreased ROM, decreased strength, postural dysfunction, and pain.   ACTIVITY LIMITATIONS: carrying, lifting, sitting, sleeping, bed mobility, and locomotion level  PARTICIPATION LIMITATIONS: meal prep, cleaning, driving, shopping, community activity, and occupation  PERSONAL FACTORS: Age, Behavior pattern, Past/current experiences, Time since onset of injury/illness/exacerbation, and 3+ comorbidities: C4-6 ACDF, Rt TKA, hx CVA (2014), HTN, COPD, anxiety, arthritis are also affecting patient's functional outcome.   REHAB POTENTIAL: Good  CLINICAL DECISION MAKING: Evolving/moderate complexity  EVALUATION COMPLEXITY: Moderate   GOALS: Goals reviewed with patient? Yes  SHORT TERM GOALS: Target date: 11/08/2024  Independent with initial HEP Goal status: INITIAL   LONG TERM GOALS: Target date: 11/29/2024  Independent with final HEP Goal status: INITIAL  2.  PSFS score improved by 2  points Goal status: INITIAL  3.  Bil hip strength improved to 5/5 for improved mobility and function Goal status: INIITAL  4.  Report pain < 3/10 with lying on either side for improved function Goal status: INITIAL   PLAN:  PT FREQUENCY: 1x/week  PT DURATION: 6 weeks  PLANNED INTERVENTIONS: 97164- PT Re-evaluation, 97750- Physical Performance Testing, 97110-Therapeutic exercises, 97530- Therapeutic activity, [id number]- Neuromuscular re-education, 97535- Self Care, [id number]- Manual therapy, [id number]- Aquatic Therapy, H9716- Electrical stimulation (unattended), 97035- Ultrasound, 79439 (1-2 muscles), 20561 (3+ muscles)- Dry Needling, Patient/Family education, Taping, Joint mobilization, Joint manipulation, Cryotherapy, and Moist heat.  PLAN FOR NEXT SESSION: Review HEP, manual/modalities PRN, needs glute strengthening   NEXT MD VISIT: Dr. Georgina 11/13/24   Corean JULIANNA Ku, PT, DPT 10/18/2024 10:07 AM

## 2024-10-23 ENCOUNTER — Encounter: Payer: Self-pay | Admitting: Physical Therapy

## 2024-10-23 ENCOUNTER — Ambulatory Visit: Payer: Self-pay | Admitting: Physical Therapy

## 2024-10-23 DIAGNOSIS — M5459 Other low back pain: Secondary | ICD-10-CM

## 2024-10-23 DIAGNOSIS — M25551 Pain in right hip: Secondary | ICD-10-CM

## 2024-10-23 DIAGNOSIS — M6281 Muscle weakness (generalized): Secondary | ICD-10-CM

## 2024-10-23 NOTE — Therapy (Signed)
 OUTPATIENT PHYSICAL THERAPY TREATMENT   Patient Name: [name] MRN: [id number] DOB:09/06/1971, 53 y.o., female Today's Date: 10/23/2024  END OF SESSION:  PT End of Session - 10/23/24 0758     Visit Number 2    Number of Visits 6    Date for Recertification  11/29/24    Authorization Type [employer]    PT Start Time 0800    PT Stop Time 0841    PT Time Calculation (min) 41 min    Activity Tolerance Patient tolerated treatment well    Behavior During Therapy WFL for tasks assessed/performed           Past Medical History:  Diagnosis Date   Anemia    Anxiety    Arthritis    right knee, back   COPD (chronic obstructive pulmonary disease) (HCC)    GERD (gastroesophageal reflux disease)    Headache(784.0)    History of blood transfusion    Hx; of in 1991 after delivery   History of TIAs    Hypertension    Numbness    Right hand   Pneumonia    Stroke (HCC) 05/2013   Past Surgical History:  Procedure Laterality Date   ANTERIOR CERVICAL DECOMP/DISCECTOMY FUSION N/A 03/06/2014   Procedure: ANTERIOR CERVICAL DECOMPRESSION/DISCECTOMY FUSION 2 LEVELS four/five, five/six;  Surgeon: Alm GORMAN Molt, MD;  Location: [facility] NEURO ORS;  Service: Neurosurgery;  Laterality: N/A;   CESAREAN SECTION     x 1 with 5th pregnancy   CHOLECYSTECTOMY     KNEE ARTHROSCOPY Right 2015   TEE WITHOUT CARDIOVERSION N/A 05/21/2013   Procedure: TRANSESOPHAGEAL ECHOCARDIOGRAM (TEE);  Surgeon: Wilbert JONELLE Bihari, MD;  Location: [facility] ENDOSCOPY;  Service: Cardiovascular;  Laterality: N/A;   TOTAL KNEE ARTHROPLASTY Right 01/06/2021   TOTAL KNEE ARTHROPLASTY Right 01/06/2021   Procedure: RIGHT TOTAL KNEE ARTHROPLASTY;  Surgeon: Addie Cordella Hamilton, MD;  Location: [facility] OR;  Service: Orthopedics;  Laterality: Right;   TUBAL LIGATION     Patient Active Problem List   Diagnosis Date Noted   Adjustment disorder with mixed anxiety and depressed mood 04/19/2023   Hyperlipidemia 11/16/2022   Diabetes mellitus, type 2  (HCC) 11/16/2022   Trochanteric bursitis, left hip 09/03/2022   Asthmatic bronchitis with acute exacerbation 04/09/2022   Chronic cough 01/27/2022   Right thyroid nodule 01/07/2022   Essential hypertension 01/05/2022   Migraine 01/05/2022   Thyroid nodule greater than or equal to 1.5 cm in diameter incidentally noted on imaging study 12/01/2021   Grief 02/06/2021   Non-intractable vomiting 02/06/2021   Arthritis of right knee    S/P knee replacement 01/06/2021   Leiomyoma of body of uterus 12/24/2016   Prediabetes 11/11/2016   Pap smear abnormality of cervix with ASCUS favoring benign 01/01/2016   COPD (chronic obstructive pulmonary disease) (HCC) 11/18/2015   GERD (gastroesophageal reflux disease) 11/18/2015   Abnormal imaging of thyroid 11/04/2015   COPD exacerbation (HCC) 11/01/2015   Elevated d-dimer 11/01/2015   IDA (iron  deficiency anemia) 09/04/2015   Patellofemoral disorder 04/04/2015   Headache disorder 07/30/2014   Insomnia 04/09/2014   Cervical spondylosis with radiculopathy 03/25/2014   S/P cervical spinal fusion 03/06/2014   Class 2 severe obesity due to excess calories with serious comorbidity and body mass index (BMI) of 37.0 to 37.9 in adult 02/22/2014   Brachial radiculitis 01/29/2014   Radicular pain in right arm 01/11/2014   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   Other and unspecified hyperlipidemia 08/24/2013   History of  CVA (cerebrovascular accident) 05/18/2013   Tobacco abuse, 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    PCP: Jaycee Greig PARAS, NP  REFERRING PROVIDER: Addie Cordella Hamilton, MD  REFERRING DIAG: [phone] (ICD-10-CM) - Lumbar radiculopathy  Rationale for Evaluation and Treatment: Rehabilitation  THERAPY DIAG:  Pain of both hip joints - Plan: PT plan of care cert/re-cert  Muscle weakness (generalized) - Plan: PT plan of care cert/re-cert  Other low back pain - Plan: PT plan of care cert/re-cert  ONSET DATE: Rt x 1 year; Lt x 4 months   SUBJECTIVE:                                                                                                                                                                                            SUBJECTIVE STATEMENT: Back is about the same. Reports she is barely sleeping.  PERTINENT HISTORY:  C4-6 ACDF, Rt TKA, hx CVA (2014), HTN, COPD, anxiety, arthritis  PAIN:  Are you having pain? Yes: NPRS scale: 0 currently, up to 5/10 Pain location: bil lateral hips, radiates to feet Pain description: aching Aggravating factors: lying on either side, sitting (has to reposition) Relieving factors: repositioning  PRECAUTIONS:  None  RED FLAGS: None   WEIGHT BEARING RESTRICTIONS:  No  FALLS:  Has patient fallen in last 6 months? 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  DIAGNOSTIC FINDINGS:  MRI (Feb 2024): 1. Progressive now severe bilateral facet arthropathy at L3-L4 with new mild spinal canal and moderate left neuroforaminal stenosis.1. Progressive now severe bilateral facet arthropathy at L3-L4 with new mild spinal canal and moderate left neuroforaminal stenosis.  X-rays (Nov 2025): No  significant degenerative disc disease between the vertebral bodies.   Moderate facet arthritis is present throughout the lumbar spine.  No acute  compression fractures.  PATIENT SURVEYS:  Patient-Specific Activity Scoring Scheme  0 represents unable to perform. 10 represents able to perform at prior level. 0 1 2 3 4 5 6 7 8 9  10 (Date and Score)   Activity Eval     1. Lying on either side  0    2. Sitting 8    Score 4     Total score = sum of the activity scores/number of activities Minimum detectable change (90%CI) for average score =  2 points Minimum detectable change (90%CI) for single activity score = 3 points    COGNITIVE STATUS: Within functional limits for tasks assessed   SENSATION: WFL  POSTURE:  rounded shoulders, forward head, and increased lumbar lordosis  GAIT: 10/18/2024 Comments: independent   PALPATION: 10/18/2024 Pain L1-5; bil greater trochanter   LUMBAR ROM:   ROM A/PROM  eval  Flexion Limited 25%  Extension WNL  Right quadrant WNL with pain  Left quadrant WNL with pain   (Blank rows = not tested)     LOWER EXTREMITY MMT:    MMT Right eval Left eval  Hip flexion 5/5 5/5  Hip extension 4/5 4/5  Hip abduction 4/5 4/5  Knee flexion 5/5 5/5  Knee extension 5/5 5/5  Ankle dorsiflexion 5/5 5/5   (Blank rows = not tested)    SPECIAL TESTS:  10/18/2024 Lumbar Slump test: Negative   TREATMENT:                                                                                                                              DATE:  10/23/24 TherEx NuStep L5 x 8 min Bridges x 10 reps; 5 sec hold Single knee to chest 3x30 sec bil Supine piriformis stretch 3x30 sec bil Lower trunk rotation 3x30 sec bil Standing hip abduction x10 reps bil; L3 band Standing hip extension x10 reps bil; L3 band  Manual IASTM with percussive device to bil glutes, lumbar paraspinals and QLs    10/18/2024 TherEx See HEP - demonstrated with trial reps performed PRN, mod cues for comprehension    Self Care Educated on clinical findings and PT POC     PATIENT EDUCATION:  Education details: HEP Person educated: Patient Education method: Programmer, Multimedia, Facilities Manager, and Handouts Education comprehension: verbalized understanding, returned demonstration, and needs further education  HOME EXERCISE PROGRAM: Access Code: [email or web address] URL: [email or web address] Date:  10/18/2024 Prepared by: Corean Ku  Exercises - Supine Bridge  - 2 x daily - 7 x weekly - 2 sets - 10 reps - 5 sec hold - Standing Hip Extension with Resistance at Ankles and Counter Support  - 2 x daily - 7 x weekly - 2 sets - 10 reps - Standing Hip Abduction with Resistance at Ankles and Counter Support  - 2 x daily - 7 x weekly - 2 sets - 10 reps - Hooklying Single Knee to Chest  - 2 x daily - 7 x weekly - 1 sets - 3 reps - 30 sec hold - Supine Piriformis Stretch with Foot on Ground  - 2 x daily - 7 x weekly - 1 sets - 3 reps - 30 sec hold - Supine Lower Trunk Rotation  - 2 x daily - 7 x weekly - 1 sets - 3 reps - 30 sec hold   ASSESSMENT:  CLINICAL IMPRESSION: Pt with good recall of initial HEP meeting STG #1.  Overall doing well with PT, trial of percussive device today to see  if this helps pain.  Continue skilled PT.    OBJECTIVE IMPAIRMENTS: decreased mobility, difficulty walking, decreased ROM, decreased strength, postural dysfunction, and pain.   ACTIVITY LIMITATIONS: carrying, lifting, sitting, sleeping, bed mobility, and locomotion level  PARTICIPATION LIMITATIONS: meal prep, cleaning, driving, shopping, community activity, and occupation  PERSONAL FACTORS: Age, Behavior pattern, Past/current experiences, Time since onset of injury/illness/exacerbation, and 3+ comorbidities: C4-6 ACDF, Rt TKA, hx CVA (2014), HTN, COPD, anxiety, arthritis are also affecting patient's functional outcome.   REHAB POTENTIAL: Good  CLINICAL DECISION MAKING: Evolving/moderate complexity  EVALUATION COMPLEXITY: Moderate   GOALS: Goals reviewed with patient? Yes  SHORT TERM GOALS: Target date: 11/08/2024  Independent with initial HEP Goal status: INITIAL   LONG TERM GOALS: Target date: 11/29/2024  Independent with final HEP Goal status: INITIAL  2.  PSFS score improved by 2 points Goal status: INITIAL  3.  Bil hip strength improved to 5/5 for improved mobility and  function Goal status: INIITAL  4.  Report pain < 3/10 with lying on either side for improved function Goal status: INITIAL   PLAN:  PT FREQUENCY: 1x/week  PT DURATION: 6 weeks  PLANNED INTERVENTIONS: 97164- PT Re-evaluation, 97750- Physical Performance Testing, 97110-Therapeutic exercises, 97530- Therapeutic activity, [id number]- Neuromuscular re-education, 97535- Self Care, 02859- Manual therapy, [id number]- Aquatic Therapy, H9716- Electrical stimulation (unattended), 97035- Ultrasound, 79439 (1-2 muscles), 20561 (3+ muscles)- Dry Needling, Patient/Family education, Taping, Joint mobilization, Joint manipulation, Cryotherapy, and Moist heat.  PLAN FOR NEXT SESSION: check order for ionto and trial of signed, Review HEP, manual/modalities PRN, needs glute strengthening   NEXT MD VISIT: Dr. Georgina 11/13/24   Corean JULIANNA Ku, PT, DPT 10/23/2024 8:43 AM

## 2024-10-31 ENCOUNTER — Encounter: Payer: Self-pay | Admitting: Physical Therapy

## 2024-10-31 ENCOUNTER — Ambulatory Visit: Payer: PRIVATE HEALTH INSURANCE | Admitting: Physical Therapy

## 2024-10-31 DIAGNOSIS — M25551 Pain in right hip: Secondary | ICD-10-CM

## 2024-10-31 DIAGNOSIS — M25552 Pain in left hip: Secondary | ICD-10-CM | POA: Diagnosis not present

## 2024-10-31 DIAGNOSIS — M6281 Muscle weakness (generalized): Secondary | ICD-10-CM | POA: Diagnosis not present

## 2024-10-31 DIAGNOSIS — M5459 Other low back pain: Secondary | ICD-10-CM

## 2024-10-31 NOTE — Therapy (Signed)
 " OUTPATIENT PHYSICAL THERAPY TREATMENT   Patient Name: [name] MRN: [id number] DOB:28-Aug-1971, 53 y.o., female Today's Date: 10/31/2024  END OF SESSION:  PT End of Session - 10/31/24 0921     Visit Number 3    Number of Visits 6    Date for Recertification  11/29/24    Authorization Type [employer]    PT Start Time [phone]    PT Stop Time 1008    PT Time Calculation (min) 43 min    Activity Tolerance Patient tolerated treatment well    Behavior During Therapy WFL for tasks assessed/performed            Past Medical History:  Diagnosis Date   Anemia    Anxiety    Arthritis    right knee, back   COPD (chronic obstructive pulmonary disease) (HCC)    GERD (gastroesophageal reflux disease)    Headache(784.0)    History of blood transfusion    Hx; of in 1991 after delivery   History of TIAs    Hypertension    Numbness    Right hand   Pneumonia    Stroke (HCC) 05/2013   Past Surgical History:  Procedure Laterality Date   ANTERIOR CERVICAL DECOMP/DISCECTOMY FUSION N/A 03/06/2014   Procedure: ANTERIOR CERVICAL DECOMPRESSION/DISCECTOMY FUSION 2 LEVELS four/five, five/six;  Surgeon: Alm GORMAN Molt, MD;  Location: [facility] NEURO ORS;  Service: Neurosurgery;  Laterality: N/A;   CESAREAN SECTION     x 1 with 5th pregnancy   CHOLECYSTECTOMY     KNEE ARTHROSCOPY Right 2015   TEE WITHOUT CARDIOVERSION N/A 05/21/2013   Procedure: TRANSESOPHAGEAL ECHOCARDIOGRAM (TEE);  Surgeon: Wilbert JONELLE Bihari, MD;  Location: [facility] ENDOSCOPY;  Service: Cardiovascular;  Laterality: N/A;   TOTAL KNEE ARTHROPLASTY Right 01/06/2021   TOTAL KNEE ARTHROPLASTY Right 01/06/2021   Procedure: RIGHT TOTAL KNEE ARTHROPLASTY;  Surgeon: Addie Cordella Hamilton, MD;  Location: [facility] OR;  Service: Orthopedics;  Laterality: Right;   TUBAL LIGATION     Patient Active Problem List   Diagnosis Date Noted   Adjustment disorder with mixed anxiety and depressed mood 04/19/2023   Hyperlipidemia 11/16/2022   Diabetes mellitus, type 2  (HCC) 11/16/2022   Trochanteric bursitis, left hip 09/03/2022   Asthmatic bronchitis with acute exacerbation 04/09/2022   Chronic cough 01/27/2022   Right thyroid nodule 01/07/2022   Essential hypertension 01/05/2022   Migraine 01/05/2022   Thyroid nodule greater than or equal to 1.5 cm in diameter incidentally noted on imaging study 12/01/2021   Grief 02/06/2021   Non-intractable vomiting 02/06/2021   Arthritis of right knee    S/P knee replacement 01/06/2021   Leiomyoma of body of uterus 12/24/2016   Prediabetes 11/11/2016   Pap smear abnormality of cervix with ASCUS favoring benign 01/01/2016   COPD (chronic obstructive pulmonary disease) (HCC) 11/18/2015   GERD (gastroesophageal reflux disease) 11/18/2015   Abnormal imaging of thyroid 11/04/2015   COPD exacerbation (HCC) 11/01/2015   Elevated d-dimer 11/01/2015   IDA (iron  deficiency anemia) 09/04/2015   Patellofemoral disorder 04/04/2015   Headache disorder 07/30/2014   Insomnia 04/09/2014   Cervical spondylosis with radiculopathy 03/25/2014   S/P cervical spinal fusion 03/06/2014   Class 2 severe obesity due to excess calories with serious comorbidity and body mass index (BMI) of 37.0 to 37.9 in adult 02/22/2014   Brachial radiculitis 01/29/2014   Radicular pain in right arm 01/11/2014   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   Other and unspecified hyperlipidemia 08/24/2013  History of CVA (cerebrovascular accident) 05/18/2013   Tobacco abuse, 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    PCP: Jaycee Greig PARAS, NP  REFERRING PROVIDER: Addie Cordella Hamilton, MD  REFERRING DIAG: [phone] (ICD-10-CM) - Lumbar radiculopathy  Rationale for Evaluation and Treatment: Rehabilitation  THERAPY DIAG:  Pain of both hip joints  Muscle weakness (generalized)  Other low back pain  ONSET DATE: Rt x 1 year; Lt x 4 months   SUBJECTIVE:                                                                                                                                                                                            SUBJECTIVE STATEMENT: Feels the same; no pain at this time.  No improvement in sleeping.    PERTINENT HISTORY:  C4-6 ACDF, Rt TKA, hx CVA (2014), HTN, COPD, anxiety, arthritis  PAIN:  Are you having pain? Yes: NPRS scale: 0 currently, up to 5/10 Pain location: bil lateral hips, radiates to feet Pain description: aching Aggravating factors: lying on either side, sitting (has to reposition) Relieving factors: repositioning  PRECAUTIONS:  None  RED FLAGS: None   WEIGHT BEARING RESTRICTIONS:  No  FALLS:  Has patient fallen in last 6 months? 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  DIAGNOSTIC FINDINGS:  MRI (Feb 2024): 1. Progressive now severe bilateral facet arthropathy at L3-L4 with new mild spinal canal and moderate left neuroforaminal stenosis.1. Progressive now severe bilateral facet arthropathy at L3-L4 with new mild spinal canal and moderate left neuroforaminal stenosis.  X-rays (Nov 2025): No  significant degenerative disc disease between the vertebral bodies.   Moderate facet arthritis is present throughout the lumbar spine.  No acute  compression fractures.  PATIENT SURVEYS:  Patient-Specific Activity Scoring Scheme  0 represents unable to perform. 10 represents able to perform at prior level. 0 1 2 3 4 5 6 7 8 9  10 (Date and Score)   Activity Eval     1. Lying on either side  0    2. Sitting 8    Score 4    Total score = sum of the activity scores/number of activities Minimum detectable change (90%CI)  for average score = 2 points Minimum detectable change (90%CI) for single activity score = 3 points  COGNITIVE STATUS: Within functional limits for tasks assessed   SENSATION: WFL  POSTURE:  rounded shoulders, forward head, and increased lumbar lordosis  GAIT: 10/18/2024 Comments: independent   PALPATION: 10/18/2024 Pain L1-5; bil greater trochanter   LUMBAR ROM:   ROM A/PROM  eval  Flexion Limited 25%  Extension WNL  Right quadrant WNL with pain  Left quadrant WNL with pain   (Blank rows = not tested)     LOWER EXTREMITY MMT:    MMT Right eval Left eval  Hip flexion 5/5 5/5  Hip extension 4/5 4/5  Hip abduction 4/5 4/5  Knee flexion 5/5 5/5  Knee extension 5/5 5/5  Ankle dorsiflexion 5/5 5/5   (Blank rows = not tested)    SPECIAL TESTS:  10/18/2024 Lumbar Slump test: Negative   TREATMENT:                                                                                                                              DATE:  10/31/24 TherEx NuStep L5 x 8 min Standing hip abduction with extension 2x10; L3 band Squats 2x10 Supine single knee to chest 3x30 sec Supine figure-4 piriformis stretch 3x30 sec  Modalities Ionto to bil hips/greater trochanters; 2.0c c fill of dexamethasone  to 4 hour patch Educated on patch wear and expectations/precautions   10/23/24 TherEx NuStep L5 x 8 min Bridges x 10 reps; 5 sec hold Single knee to chest 3x30 sec bil Supine piriformis stretch 3x30 sec bil Lower trunk rotation 3x30 sec bil Standing hip abduction x10 reps bil; L3 band Standing hip extension x10 reps bil; L3 band  Manual IASTM with percussive device to bil glutes, lumbar paraspinals and QLs    10/18/2024 TherEx See HEP - demonstrated with trial reps performed PRN, mod cues for comprehension    Self Care Educated on clinical findings and PT POC     PATIENT EDUCATION:  Education details: HEP Person educated: Patient Education method:  Programmer, Multimedia, Facilities Manager, and Handouts Education comprehension: verbalized understanding, returned demonstration, and needs further education  HOME EXERCISE PROGRAM: Access Code: [email or web address] URL: [email or web address] Date: 10/18/2024 Prepared by: Corean Ku  Exercises - Supine Bridge  - 2 x daily - 7 x weekly - 2 sets - 10 reps - 5 sec hold - Standing Hip Extension with Resistance at Ankles and Counter Support  - 2 x daily - 7 x weekly - 2 sets - 10 reps - Standing Hip Abduction with Resistance at Ankles and Counter Support  - 2 x daily - 7 x weekly - 2 sets - 10 reps - Hooklying Single Knee to Chest  - 2 x daily - 7 x weekly - 1 sets - 3 reps - 30 sec hold - Supine Piriformis Stretch with Foot on Ground  - 2 x daily - 7 x weekly - 1 sets - 3 reps - 30 sec hold - Supine Lower Trunk Rotation  - 2 x daily - 7 x weekly -  1 sets - 3 reps - 30 sec hold   ASSESSMENT:  CLINICAL IMPRESSION: Trial of ionto today to bil hips to see if this helps with pain and sleeping.  Overall minimal change in pain reported at this time.  Continue skilled PT.      OBJECTIVE IMPAIRMENTS: decreased mobility, difficulty walking, decreased ROM, decreased strength, postural dysfunction, and pain.   ACTIVITY LIMITATIONS: carrying, lifting, sitting, sleeping, bed mobility, and locomotion level  PARTICIPATION LIMITATIONS: meal prep, cleaning, driving, shopping, community activity, and occupation  PERSONAL FACTORS: Age, Behavior pattern, Past/current experiences, Time since onset of injury/illness/exacerbation, and 3+ comorbidities: C4-6 ACDF, Rt TKA, hx CVA (2014), HTN, COPD, anxiety, arthritis are also affecting patient's functional outcome.   REHAB POTENTIAL: Good  CLINICAL DECISION MAKING: Evolving/moderate complexity  EVALUATION COMPLEXITY: Moderate   GOALS: Goals reviewed with patient? Yes  SHORT TERM GOALS: Target date: 11/08/2024  Independent with initial HEP Goal status:  INITIAL   LONG TERM GOALS: Target date: 11/29/2024  Independent with final HEP Goal status: INITIAL  2.  PSFS score improved by 2 points Goal status: INITIAL  3.  Bil hip strength improved to 5/5 for improved mobility and function Goal status: INIITAL  4.  Report pain < 3/10 with lying on either side for improved function Goal status: INITIAL   PLAN:  PT FREQUENCY: 1x/week  PT DURATION: 6 weeks  PLANNED INTERVENTIONS: 97164- PT Re-evaluation, 97750- Physical Performance Testing, 97110-Therapeutic exercises, 97530- Therapeutic activity, [id number]- Neuromuscular re-education, 97535- Self Care, [id number]- Manual therapy, [id number]- Aquatic Therapy, H9716- Electrical stimulation (unattended), 97035- Ultrasound, 79439 (1-2 muscles), 20561 (3+ muscles)- Dry Needling, Patient/Family education, Taping, Joint mobilization, Joint manipulation, Cryotherapy, and Moist heat.  PLAN FOR NEXT SESSION: assess response to ionto,  Review HEP, manual/modalities PRN, needs glute strengthening   NEXT MD VISIT: Dr. Georgina 11/13/24   Corean JULIANNA Ku, PT, DPT 10/31/2024 11:15 AM   "

## 2024-10-31 NOTE — Patient Instructions (Signed)

## 2024-11-04 NOTE — Therapy (Incomplete)
 " OUTPATIENT PHYSICAL THERAPY TREATMENT   Patient Name: [name] MRN: [id number] DOB:08/29/1971, 53 y.o., female Today's Date: 11/04/2024  END OF SESSION:      Past Medical History:  Diagnosis Date   Anemia    Anxiety    Arthritis    right knee, back   COPD (chronic obstructive pulmonary disease) (HCC)    GERD (gastroesophageal reflux disease)    Headache(784.0)    History of blood transfusion    Hx; of in 1991 after delivery   History of TIAs    Hypertension    Numbness    Right hand   Pneumonia    Stroke (HCC) 05/2013   Past Surgical History:  Procedure Laterality Date   ANTERIOR CERVICAL DECOMP/DISCECTOMY FUSION N/A 03/06/2014   Procedure: ANTERIOR CERVICAL DECOMPRESSION/DISCECTOMY FUSION 2 LEVELS four/five, five/six;  Surgeon: Alm GORMAN Molt, MD;  Location: [facility] NEURO ORS;  Service: Neurosurgery;  Laterality: N/A;   CESAREAN SECTION     x 1 with 5th pregnancy   CHOLECYSTECTOMY     KNEE ARTHROSCOPY Right 2015   TEE WITHOUT CARDIOVERSION N/A 05/21/2013   Procedure: TRANSESOPHAGEAL ECHOCARDIOGRAM (TEE);  Surgeon: Wilbert JONELLE Bihari, MD;  Location: [facility] ENDOSCOPY;  Service: Cardiovascular;  Laterality: N/A;   TOTAL KNEE ARTHROPLASTY Right 01/06/2021   TOTAL KNEE ARTHROPLASTY Right 01/06/2021   Procedure: RIGHT TOTAL KNEE ARTHROPLASTY;  Surgeon: Addie Cordella Hamilton, MD;  Location: [facility] OR;  Service: Orthopedics;  Laterality: Right;   TUBAL LIGATION     Patient Active Problem List   Diagnosis Date Noted   Adjustment disorder with mixed anxiety and depressed mood 04/19/2023   Hyperlipidemia 11/16/2022   Diabetes mellitus, type 2 (HCC) 11/16/2022   Trochanteric bursitis, left hip 09/03/2022   Asthmatic bronchitis with acute exacerbation 04/09/2022   Chronic cough 01/27/2022   Right thyroid nodule 01/07/2022   Essential hypertension 01/05/2022   Migraine 01/05/2022   Thyroid nodule greater than or equal to 1.5 cm in diameter incidentally noted on imaging study 12/01/2021    Grief 02/06/2021   Non-intractable vomiting 02/06/2021   Arthritis of right knee    S/P knee replacement 01/06/2021   Leiomyoma of body of uterus 12/24/2016   Prediabetes 11/11/2016   Pap smear abnormality of cervix with ASCUS favoring benign 01/01/2016   COPD (chronic obstructive pulmonary disease) (HCC) 11/18/2015   GERD (gastroesophageal reflux disease) 11/18/2015   Abnormal imaging of thyroid 11/04/2015   COPD exacerbation (HCC) 11/01/2015   Elevated d-dimer 11/01/2015   IDA (iron  deficiency anemia) 09/04/2015   Patellofemoral disorder 04/04/2015   Headache disorder 07/30/2014   Insomnia 04/09/2014   Cervical spondylosis with radiculopathy 03/25/2014   S/P cervical spinal fusion 03/06/2014   Class 2 severe obesity due to excess calories with serious comorbidity and body mass index (BMI) of 37.0 to 37.9 in adult 02/22/2014   Brachial radiculitis 01/29/2014   Radicular pain in right arm 01/11/2014   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   Other and unspecified hyperlipidemia 08/24/2013   History of CVA (cerebrovascular accident) 05/18/2013   Tobacco abuse, 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    PCP: Jaycee Greig PARAS, NP  REFERRING PROVIDER: Addie Cordella Hamilton, MD  REFERRING DIAG: M54.16 (ICD-10-CM) - Lumbar radiculopathy  Rationale for Evaluation and Treatment: Rehabilitation  THERAPY DIAG:  No diagnosis found.  ONSET DATE: Rt x 1 year; Lt x 4 months   SUBJECTIVE:  SUBJECTIVE STATEMENT: ***Feels the same; no pain at this time.  No improvement in sleeping.    PERTINENT HISTORY:  C4-6 ACDF, Rt TKA, hx CVA (2014), HTN, COPD, anxiety, arthritis  PAIN:  ***Are you having pain? Yes: NPRS scale: 0 currently, up to 5/10 Pain location: bil lateral hips, radiates to feet Pain description:  aching Aggravating factors: lying on either side, sitting (has to reposition) Relieving factors: repositioning  PRECAUTIONS:  None  RED FLAGS: None   WEIGHT BEARING RESTRICTIONS:  No  FALLS:  Has patient fallen in last 6 months? 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  DIAGNOSTIC FINDINGS:  MRI (Feb 2024): 1. Progressive now severe bilateral facet arthropathy at L3-L4 with new mild spinal canal and moderate left neuroforaminal stenosis.1. Progressive now severe bilateral facet arthropathy at L3-L4 with new mild spinal canal and moderate left neuroforaminal stenosis.  X-rays (Nov 2025): No  significant degenerative disc disease between the vertebral bodies.   Moderate facet arthritis is present throughout the lumbar spine.  No acute  compression fractures.  PATIENT SURVEYS:  Patient-Specific Activity Scoring Scheme  0 represents unable to perform. 10 represents able to perform at prior level. 0 1 2 3 4 5 6 7 8 9  10 (Date and Score)   Activity Eval     1. Lying on either side  0    2. Sitting 8    Score 4    Total score = sum of the activity scores/number of activities Minimum detectable change (90%CI) for average score = 2 points Minimum detectable change (90%CI) for single activity score = 3 points    COGNITIVE STATUS: Within functional limits for tasks assessed   SENSATION: WFL  POSTURE:  rounded shoulders, forward head, and increased lumbar lordosis  GAIT: 10/18/2024 Comments: independent   PALPATION: 10/18/2024 Pain L1-5; bil greater trochanter   LUMBAR ROM:   ROM  A/PROM  eval  Flexion Limited 25%  Extension WNL  Right quadrant WNL with pain  Left quadrant WNL with pain   (Blank rows = not tested)     LOWER EXTREMITY MMT:    MMT Right eval Left eval  Hip flexion 5/5 5/5  Hip extension 4/5 4/5  Hip abduction 4/5 4/5  Knee flexion 5/5 5/5  Knee extension 5/5 5/5  Ankle dorsiflexion 5/5 5/5   (Blank rows = not tested)    SPECIAL TESTS:  10/18/2024 Lumbar Slump test: Negative   TREATMENT:  DATE:  11/05/24***     10/31/24 TherEx NuStep L5 x 8 min Standing hip abduction with extension 2x10; L3 band Squats 2x10 Supine single knee to chest 3x30 sec Supine figure-4 piriformis stretch 3x30 sec  Modalities Ionto to bil hips/greater trochanters; 2.0c c fill of dexamethasone  to 4 hour patch Educated on patch wear and expectations/precautions   10/23/24 TherEx NuStep L5 x 8 min Bridges x 10 reps; 5 sec hold Single knee to chest 3x30 sec bil Supine piriformis stretch 3x30 sec bil Lower trunk rotation 3x30 sec bil Standing hip abduction x10 reps bil; L3 band Standing hip extension x10 reps bil; L3 band  Manual IASTM with percussive device to bil glutes, lumbar paraspinals and QLs    10/18/2024 TherEx See HEP - demonstrated with trial reps performed PRN, mod cues for comprehension    Self Care Educated on clinical findings and PT POC     PATIENT EDUCATION:  Education details: HEP Person educated: Patient Education method: Programmer, Multimedia, Facilities Manager, and Handouts Education comprehension: verbalized understanding, returned demonstration, and needs further education  HOME EXERCISE PROGRAM: Access Code: [email or web address] URL: [email or web address] Date: 10/18/2024 Prepared by: Corean Ku  Exercises - Supine Bridge  - 2 x daily - 7 x weekly - 2 sets - 10 reps - 5 sec hold -  Standing Hip Extension with Resistance at Ankles and Counter Support  - 2 x daily - 7 x weekly - 2 sets - 10 reps - Standing Hip Abduction with Resistance at Ankles and Counter Support  - 2 x daily - 7 x weekly - 2 sets - 10 reps - Hooklying Single Knee to Chest  - 2 x daily - 7 x weekly - 1 sets - 3 reps - 30 sec hold - Supine Piriformis Stretch with Foot on Ground  - 2 x daily - 7 x weekly - 1 sets - 3 reps - 30 sec hold - Supine Lower Trunk Rotation  - 2 x daily - 7 x weekly - 1 sets - 3 reps - 30 sec hold   ASSESSMENT:  CLINICAL IMPRESSION: ***Trial of ionto today to bil hips to see if this helps with pain and sleeping.  Overall minimal change in pain reported at this time.  Continue skilled PT.      OBJECTIVE IMPAIRMENTS: decreased mobility, difficulty walking, decreased ROM, decreased strength, postural dysfunction, and pain.   ACTIVITY LIMITATIONS: carrying, lifting, sitting, sleeping, bed mobility, and locomotion level  PARTICIPATION LIMITATIONS: meal prep, cleaning, driving, shopping, community activity, and occupation  PERSONAL FACTORS: Age, Behavior pattern, Past/current experiences, Time since onset of injury/illness/exacerbation, and 3+ comorbidities: C4-6 ACDF, Rt TKA, hx CVA (2014), HTN, COPD, anxiety, arthritis are also affecting patient's functional outcome.   REHAB POTENTIAL: Good  CLINICAL DECISION MAKING: Evolving/moderate complexity  EVALUATION COMPLEXITY: Moderate   GOALS: Goals reviewed with patient? Yes  SHORT TERM GOALS: Target date: 11/08/2024***  Independent with initial HEP Goal status: INITIAL   LONG TERM GOALS: Target date: 11/29/2024  Independent with final HEP Goal status: INITIAL  2.  PSFS score improved by 2 points Goal status: INITIAL  3.  Bil hip strength improved to 5/5 for improved mobility and function Goal status: INIITAL  4.  Report pain < 3/10 with lying on either side for improved function Goal status: INITIAL   PLAN:  PT  FREQUENCY: 1x/week  PT DURATION: 6 weeks  PLANNED INTERVENTIONS: 97164- PT Re-evaluation, 97750- Physical Performance Testing, 97110-Therapeutic exercises, 97530- Therapeutic activity, [id number]- Neuromuscular re-education, 97535- Self Care, 02859- Manual  therapy, 97113- Aquatic Therapy, [phone]- Electrical stimulation (unattended), [id number]- Ultrasound, 79439 (1-2 muscles), 20561 (3+ muscles)- Dry Needling, Patient/Family education, Taping, Joint mobilization, Joint manipulation, Cryotherapy, and Moist heat.  PLAN FOR NEXT SESSION: ***assess response to ionto,  Review HEP, manual/modalities PRN, needs glute strengthening   NEXT MD VISIT: Dr. Georgina 11/13/24   Corean JULIANNA Ku, PT, DPT 11/04/2024 9:59 AM   "

## 2024-11-05 ENCOUNTER — Encounter: Payer: Self-pay | Admitting: Family

## 2024-11-05 ENCOUNTER — Ambulatory Visit (INDEPENDENT_AMBULATORY_CARE_PROVIDER_SITE_OTHER): Payer: PRIVATE HEALTH INSURANCE | Admitting: Family

## 2024-11-05 VITALS — BP 129/89 | HR 69 | Temp 98.3°F | Resp 16 | Ht 63.5 in | Wt 177.4 lb

## 2024-11-05 DIAGNOSIS — K219 Gastro-esophageal reflux disease without esophagitis: Secondary | ICD-10-CM | POA: Diagnosis not present

## 2024-11-05 DIAGNOSIS — Z7985 Long-term (current) use of injectable non-insulin antidiabetic drugs: Secondary | ICD-10-CM | POA: Diagnosis not present

## 2024-11-05 DIAGNOSIS — Z1321 Encounter for screening for nutritional disorder: Secondary | ICD-10-CM

## 2024-11-05 DIAGNOSIS — E785 Hyperlipidemia, unspecified: Secondary | ICD-10-CM | POA: Diagnosis not present

## 2024-11-05 DIAGNOSIS — I10 Essential (primary) hypertension: Secondary | ICD-10-CM | POA: Diagnosis not present

## 2024-11-05 DIAGNOSIS — M1990 Unspecified osteoarthritis, unspecified site: Secondary | ICD-10-CM | POA: Diagnosis not present

## 2024-11-05 DIAGNOSIS — F32A Depression, unspecified: Secondary | ICD-10-CM

## 2024-11-05 DIAGNOSIS — E1165 Type 2 diabetes mellitus with hyperglycemia: Secondary | ICD-10-CM

## 2024-11-05 DIAGNOSIS — F418 Other specified anxiety disorders: Secondary | ICD-10-CM

## 2024-11-05 DIAGNOSIS — E119 Type 2 diabetes mellitus without complications: Secondary | ICD-10-CM

## 2024-11-05 MED ORDER — MELOXICAM 7.5 MG PO TABS: 7.5000 mg | ORAL_TABLET | Freq: Every day | ORAL | 0 refills | Status: AC

## 2024-11-05 MED ORDER — DULAGLUTIDE 0.75 MG/0.5ML ~~LOC~~ SOAJ: 0.7500 mg | SUBCUTANEOUS | 0 refills | Status: AC

## 2024-11-05 MED ORDER — AMLODIPINE BESYLATE 5 MG PO TABS: 5.0000 mg | ORAL_TABLET | Freq: Every day | ORAL | 0 refills | Status: AC

## 2024-11-05 MED ORDER — HYDROXYZINE PAMOATE 25 MG PO CAPS: 25.0000 mg | ORAL_CAPSULE | Freq: Three times a day (TID) | ORAL | 1 refills | Status: AC | PRN

## 2024-11-05 MED ORDER — ATORVASTATIN CALCIUM 40 MG PO TABS: 40.0000 mg | ORAL_TABLET | Freq: Every day | ORAL | 0 refills | Status: DC

## 2024-11-05 MED ORDER — CITALOPRAM HYDROBROMIDE 20 MG PO TABS: 20.0000 mg | ORAL_TABLET | Freq: Every day | ORAL | 0 refills | Status: AC

## 2024-11-05 MED ORDER — OMEPRAZOLE 40 MG PO CPDR: 40.0000 mg | DELAYED_RELEASE_CAPSULE | Freq: Every day | ORAL | 0 refills | Status: AC

## 2024-11-05 NOTE — Progress Notes (Signed)
 "   Patient ID: [name], female    DOB: [birth year]  MRN: [id number]  CC: Chronic Conditions Follow-Up  Subjective: [name] is a 53 y.o. female who presents for chronic conditions follow-up.   Her concerns today include:  - Doing well on Amlodipine , no issues/concerns. She does not complain of red flag symptoms such as but not limited to chest pain, shortness of breath, worst headache of life, nausea/vomiting.  - Doing well on Dulaglutide , no issues/concerns. Denies red flag symptoms associated with diabetes.  - Due for diabetic eye exam.  - Due for diabetic foot exam.  - Doing well on Atorvastatin , no issues/concerns.  - Doing well on Omeprazole , no issues/concerns.  - Doing well on Citalopram  and Hydroxyzine , no issues/concerns. She denies thoughts of self-harm, suicidal ideations, homicidal ideations. - Doing well on Meloxicam , no issues/concerns.  - Vitamin D  lab.   Patient Active Problem List   Diagnosis Date Noted   Adjustment disorder with mixed anxiety and depressed mood 04/19/2023   Hyperlipidemia 11/16/2022   Diabetes mellitus, type 2 (HCC) 11/16/2022   Trochanteric bursitis, left hip 09/03/2022   Asthmatic bronchitis with acute exacerbation 04/09/2022   Chronic cough 01/27/2022   Right thyroid nodule 01/07/2022   Essential hypertension 01/05/2022   Migraine 01/05/2022   Thyroid nodule greater than or equal to 1.5 cm in diameter incidentally noted on imaging study 12/01/2021   Grief 02/06/2021   Non-intractable vomiting 02/06/2021   Arthritis of right knee    S/P knee replacement 01/06/2021   Leiomyoma of body of uterus 12/24/2016   Prediabetes 11/11/2016   Pap smear abnormality of cervix with ASCUS favoring benign 01/01/2016   COPD (chronic obstructive pulmonary disease) (HCC) 11/18/2015   GERD (gastroesophageal reflux disease) 11/18/2015   Abnormal imaging of thyroid 11/04/2015   COPD exacerbation (HCC) 11/01/2015   Elevated d-dimer 11/01/2015   IDA  (iron  deficiency anemia) 09/04/2015   Patellofemoral disorder 04/04/2015   Headache disorder 07/30/2014   Insomnia 04/09/2014   Cervical spondylosis with radiculopathy 03/25/2014   S/P cervical spinal fusion 03/06/2014   Class 2 severe obesity due to excess calories with serious comorbidity and body mass index (BMI) of 37.0 to 37.9 in adult 02/22/2014   Brachial radiculitis 01/29/2014   Radicular pain in right arm 01/11/2014   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   Other and unspecified hyperlipidemia 08/24/2013   History of CVA (cerebrovascular accident) 05/18/2013   Tobacco abuse, 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     Medications Ordered Prior to Encounter[1]  Allergies[2]  Social History   Socioeconomic History   Marital status: Divorced    Spouse name: Not on file   Number of children: 7   Years of education: GEd   Highest education level: GED or equivalent  Occupational History   Occupation: Scientist, Research (medical): [employer]  Tobacco Use   Smoking status: Former    Current packs/day: 0.00    Average packs/day: 0.5 packs/day for 26.0 years (13.0 ttl pk-yrs)    Types: Cigarettes    Start date: 10/08/1989    Quit date: 10/09/2015    Years since quitting: 9.0    Passive exposure: Past   Smokeless tobacco: Former  Building Services Engineer status: Never Used  Substance and Sexual Activity   Alcohol  use: Yes    Comment: occasionally   Drug use: No   Sexual activity: Not Currently    Birth control/protection: Surgical  Other Topics Concern   Not on file  Social History Narrative   Patient lives at home with her family    She has 7 children   5 grown and out of the house   2 at home 46 and 33   She works as a LAWYER at [employer]   Tobacco Use: Medium Risk (11/05/2024)   Patient History    Smoking Tobacco Use: Former    Smokeless Tobacco Use: Former    Passive Exposure: Past  Physicist, Medical Strain: Medium Risk (06/13/2024)   Overall  Financial Resource Strain (CARDIA)    Difficulty of Paying Living Expenses: Somewhat hard  Food Insecurity: Food Insecurity Present (06/13/2024)   Epic    Worried About Programme Researcher, Broadcasting/film/video in the Last Year: Sometimes true    Ran Out of Food in the Last Year: Sometimes true  Transportation Needs: No Transportation Needs (06/13/2024)   Epic    Lack of Transportation (Medical): No    Lack of Transportation (Non-Medical): No  Physical Activity: Unknown (06/13/2024)   Exercise Vital Sign    Days of Exercise per Week: Patient declined    Minutes of Exercise per Session: Not on file  Recent Concern: Physical Activity - Insufficiently Active (05/02/2024)   Exercise Vital Sign    Days of Exercise per Week: 2 days    Minutes of Exercise per Session: 40 min  Stress: Stress Concern Present (06/13/2024)   [employer] of Occupational Health - Occupational Stress Questionnaire    Feeling of Stress: To some extent  Social Connections: Socially Isolated (06/13/2024)   Social Connection and Isolation Panel    Frequency of Communication with Friends and Family: Once a week    Frequency of Social Gatherings with Friends and Family: Once a week    Attends Religious Services: 1 to 4 times per year    Active Member of [employer] or Organizations: No    Attends Engineer, Structural: Not on file    Marital Status: Divorced  Intimate Partner Violence: Not At Risk (05/02/2024)   Epic    Fear of Current or Ex-Partner: No    Emotionally Abused: No    Physically Abused: No    Sexually Abused: No  Depression (PHQ2-9): Low Risk (11/05/2024)   Depression (PHQ2-9)    PHQ-2 Score: 0  Alcohol  Screen: Low Risk (06/13/2024)   Alcohol  Screen    Last Alcohol  Screening Score (AUDIT): 1  Housing: Unknown (06/13/2024)   Epic    Unable to Pay for Housing in the Last Year: No    Number of Times Moved in the Last Year: Not on file    Homeless in the Last Year: No  Utilities: Not At Risk (05/02/2024)   Epic    Threatened  with loss of utilities: No  Health Literacy: Adequate Health Literacy (05/02/2024)   B1300 Health Literacy    Frequency of need for help with medical instructions: Never    Family History  Problem Relation Age of Onset   Renal Disease Father        dialysis   Hypertension Father    Heart disease Mother    Heart disease Maternal Grandfather    Asthma Sister    Asthma Maternal Aunt     Past Surgical History:  Procedure Laterality Date   ANTERIOR CERVICAL DECOMP/DISCECTOMY FUSION N/A 03/06/2014   Procedure: ANTERIOR CERVICAL DECOMPRESSION/DISCECTOMY FUSION 2 LEVELS four/five, five/six;  Surgeon: Alm GORMAN Molt, MD;  Location: [facility] NEURO ORS;  Service: Neurosurgery;  Laterality:  N/A;   CESAREAN SECTION     x 1 with 5th pregnancy   CHOLECYSTECTOMY     KNEE ARTHROSCOPY Right 2015   TEE WITHOUT CARDIOVERSION N/A 05/21/2013   Procedure: TRANSESOPHAGEAL ECHOCARDIOGRAM (TEE);  Surgeon: Wilbert JONELLE Bihari, MD;  Location[facility] ENDOSCOPY;  Service: Cardiovascular;  Laterality: N/A;   TOTAL KNEE ARTHROPLASTY Right 01/06/2021   TOTAL KNEE ARTHROPLASTY Right 01/06/2021   Procedure: RIGHT TOTAL KNEE ARTHROPLASTY;  Surgeon: Addie Cordella Hamilton, MD;  Location: [facility] OR;  Service: Orthopedics;  Laterality: Right;   TUBAL LIGATION      ROS: Review of Systems Negative except as stated above  PHYSICAL EXAM: BP 129/89 (BP Location: Right Arm, Patient Position: Sitting)   Pulse 69   Temp 98.3 F (36.8 C) (Oral)   Resp 16   Ht 5' 3.5 (1.613 m)   Wt 177 lb 6.4 oz (80.5 kg)   SpO2 92%   BMI 30.93 kg/m   Physical Exam HENT:     Head: Normocephalic and atraumatic.     Nose: Nose normal.     Mouth/Throat:     Mouth: Mucous membranes are moist.     Pharynx: Oropharynx is clear.  Eyes:     Extraocular Movements: Extraocular movements intact.     Conjunctiva/sclera: Conjunctivae normal.     Pupils: Pupils are equal, round, and reactive to light.  Cardiovascular:     Rate and Rhythm: Normal rate and  regular rhythm.     Pulses: Normal pulses.     Heart sounds: Normal heart sounds.  Pulmonary:     Effort: Pulmonary effort is normal.     Breath sounds: Normal breath sounds.  Musculoskeletal:        General: Normal range of motion.     Cervical back: Normal range of motion and neck supple.  Neurological:     General: No focal deficit present.     Mental Status: She is alert and oriented to person, place, and time.  Psychiatric:        Mood and Affect: Mood normal.        Behavior: Behavior normal.    ASSESSMENT AND PLAN: 1. Primary hypertension (Primary) - Continue Amlodipine  as prescribed.  - Counseled on blood pressure goal of less than 130/80, low-sodium, DASH diet, medication compliance, and 150 minutes of moderate intensity exercise per week as tolerated. Counseled on medication adherence and adverse effects. - Follow-up with primary provider in 3 months or sooner if needed.  - amLODipine  (NORVASC ) 5 MG tablet; Take 1 tablet (5 mg total) by mouth daily.  Dispense: 90 tablet; Refill: 0  2. Type 2 diabetes mellitus with hyperglycemia, without long-term current use of insulin (HCC) - Hemoglobin A1c result pending.  - Continue Dulaglutide  as prescribed.  - Discussed the importance of healthy eating habits, low-carbohydrate diet, low-sugar diet, regular aerobic exercise (at least 150 minutes a week as tolerated) and medication compliance to achieve or maintain control of diabetes. Counseled on medication adherence/adverse effects.  - Follow-up with primary provider as scheduled.  - Dulaglutide  0.75 MG/0.5ML SOAJ; Inject 0.75 mg into the skin once a week.  Dispense: 6 mL; Refill: 0 - Hemoglobin A1c  3. Diabetic eye exam [facility]) - Referral to Ophthalmology for evaluation/management.  - Ambulatory referral to Ophthalmology  4. Encounter for diabetic foot exam [facility]) - Referral to Podiatry for evaluation/management.  - Ambulatory referral to Podiatry  5. Hyperlipidemia, unspecified  hyperlipidemia type - Continue Atorvastatin  as prescribed. Counseled on medication adherence/adverse effects.  -  Routine screening.  - Follow-up with primary provider as scheduled.  - atorvastatin  (LIPITOR) 40 MG tablet; Take 1 tablet (40 mg total) by mouth daily.  Dispense: 90 tablet; Refill: 0 - Lipid panel; Future - Lipid panel  6. Gastroesophageal reflux disease, unspecified whether esophagitis present - Continue Omeprazole  as prescribed. Counseled on medication adherence/adverse effects.  - Follow-up with primary provider in 3 months or sooner if needed.  - omeprazole  (PRILOSEC) 40 MG capsule; Take 1 capsule (40 mg total) by mouth daily.  Dispense: 90 capsule; Refill: 0  7. Anxiety and depression - Patient denies thoughts of self-harm, suicidal ideations, homicidal ideations. - Continue Citalopram  and Hydroxyzine  as prescribed. Counseled on medication adherence/adverse effects.  - Follow-up with primary provider in 3 months or sooner if needed.  - citalopram  (CELEXA ) 20 MG tablet; Take 1 tablet (20 mg total) by mouth daily.  Dispense: 90 tablet; Refill: 0 - hydrOXYzine  (VISTARIL ) 25 MG capsule; Take 1 capsule (25 mg total) by mouth every 8 (eight) hours as needed.  Dispense: 90 capsule; Refill: 1  8. Arthritis - Continue Meloxicam  as prescribed. Counseled on medication adherence/adverse effects.  - Follow-up with primary provider in 3 months or sooner if needed. - meloxicam  (MOBIC ) 7.5 MG tablet; Take 1 tablet (7.5 mg total) by mouth daily.  Dispense: 90 tablet; Refill: 0  9. Encounter for vitamin deficiency screening - Routine screening.  - Vitamin D , 25-hydroxy   Patient was given the opportunity to ask questions.  Patient verbalized understanding of the plan and was able to repeat key elements of the plan. Patient was given clear instructions to go to Emergency Department or return to medical center if symptoms don't improve, worsen, or new problems develop.The patient  verbalized understanding.   Orders Placed This Encounter  Procedures   Lipid panel   Hemoglobin A1c   Vitamin D , 25-hydroxy   Ambulatory referral to Podiatry   Ambulatory referral to Ophthalmology     Requested Prescriptions   Signed Prescriptions Disp Refills   amLODipine  (NORVASC ) 5 MG tablet 90 tablet 0    Sig: Take 1 tablet (5 mg total) by mouth daily.   Dulaglutide  0.75 MG/0.5ML SOAJ 6 mL 0    Sig: Inject 0.75 mg into the skin once a week.   atorvastatin  (LIPITOR) 40 MG tablet 90 tablet 0    Sig: Take 1 tablet (40 mg total) by mouth daily.   omeprazole  (PRILOSEC) 40 MG capsule 90 capsule 0    Sig: Take 1 capsule (40 mg total) by mouth daily.   citalopram  (CELEXA ) 20 MG tablet 90 tablet 0    Sig: Take 1 tablet (20 mg total) by mouth daily.   hydrOXYzine  (VISTARIL ) 25 MG capsule 90 capsule 1    Sig: Take 1 capsule (25 mg total) by mouth every 8 (eight) hours as needed.   meloxicam  (MOBIC ) 7.5 MG tablet 90 tablet 0    Sig: Take 1 tablet (7.5 mg total) by mouth daily.    Return in about 3 months (around 02/03/2025) for Follow-Up or next available chronic conditions.  Greig JINNY Chute, NP      [1]  Current Outpatient Medications on File Prior to Visit  Medication Sig Dispense Refill   albuterol  (VENTOLIN  HFA) 108 (90 Base) MCG/ACT inhaler Inhale 2 puffs into the lungs every 6 (six) hours as needed for wheezing or shortness of breath. 8 g 5   budesonide  (RHINOCORT  AQUA) 32 MCG/ACT nasal spray Place 1 spray into both nostrils daily. 8.43 mL 2  doxepin  (SINEQUAN ) 50 MG capsule Take 1 capsule (50 mg total) by mouth at bedtime. 30 capsule 1   EPINEPHrine  0.3 mg/0.3 mL IJ SOAJ injection Inject 0.3 mg into the muscle as needed for anaphylaxis. 1 each 1   gabapentin  (NEURONTIN ) 100 MG capsule Take 1 capsule (100 mg total) by mouth 4 (four) times daily. (Patient not taking: Reported on 08/07/2024) 120 capsule 0   promethazine  (PHENERGAN ) 12.5 MG tablet Take 1 tablet (12.5 mg total) by  mouth every 8 (eight) hours as needed for up to 5 days for nausea or vomiting. (Patient not taking: Reported on 08/07/2024) 15 tablet 0   No current facility-administered medications on file prior to visit.  [2]  Allergies Allergen Reactions   Shellfish Allergy  Itching and Swelling   Latex Itching and Rash   "

## 2024-11-06 ENCOUNTER — Ambulatory Visit: Payer: Self-pay | Admitting: Family

## 2024-11-06 ENCOUNTER — Encounter: Payer: Self-pay | Admitting: Family

## 2024-11-06 DIAGNOSIS — E559 Vitamin D deficiency, unspecified: Secondary | ICD-10-CM

## 2024-11-06 DIAGNOSIS — E785 Hyperlipidemia, unspecified: Secondary | ICD-10-CM

## 2024-11-06 LAB — HEMOGLOBIN A1C
Est. average glucose Bld gHb Est-mCnc: 120 mg/dL
Hgb A1c MFr Bld: 5.8 % — ABNORMAL HIGH (ref 4.8–5.6)

## 2024-11-06 LAB — VITAMIN D 25 HYDROXY (VIT D DEFICIENCY, FRACTURES): Vit D, 25-Hydroxy: 19.4 ng/mL — ABNORMAL LOW (ref 30.0–100.0)

## 2024-11-06 LAB — LIPID PANEL
Chol/HDL Ratio: 3.3 ratio (ref 0.0–4.4)
Cholesterol, Total: 275 mg/dL — ABNORMAL HIGH (ref 100–199)
HDL: 83 mg/dL
LDL Chol Calc (NIH): 183 mg/dL — ABNORMAL HIGH (ref 0–99)
Triglycerides: 58 mg/dL (ref 0–149)
VLDL Cholesterol Cal: 9 mg/dL (ref 5–40)

## 2024-11-06 MED ORDER — VITAMIN D (ERGOCALCIFEROL) 1.25 MG (50000 UNIT) PO CAPS: 50000.0000 [IU] | ORAL_CAPSULE | ORAL | 0 refills | Status: AC

## 2024-11-06 MED ORDER — ATORVASTATIN CALCIUM 80 MG PO TABS: 80.0000 mg | ORAL_TABLET | Freq: Every day | ORAL | 0 refills | Status: AC

## 2024-11-07 ENCOUNTER — Other Ambulatory Visit: Payer: Self-pay | Admitting: Family

## 2024-11-07 DIAGNOSIS — J3089 Other allergic rhinitis: Secondary | ICD-10-CM

## 2024-11-07 MED ORDER — FLUTICASONE PROPIONATE 50 MCG/ACT NA SUSP: 2.0000 | Freq: Every day | NASAL | 2 refills | Status: AC

## 2024-11-07 NOTE — Telephone Encounter (Signed)
 Complete

## 2024-11-12 ENCOUNTER — Encounter: Payer: PRIVATE HEALTH INSURANCE | Admitting: Physical Therapy

## 2024-11-13 ENCOUNTER — Other Ambulatory Visit (INDEPENDENT_AMBULATORY_CARE_PROVIDER_SITE_OTHER): Payer: PRIVATE HEALTH INSURANCE

## 2024-11-13 ENCOUNTER — Ambulatory Visit: Payer: PRIVATE HEALTH INSURANCE | Admitting: Orthopedic Surgery

## 2024-11-13 VITALS — BP 109/76 | HR 79 | Ht 63.5 in | Wt 178.0 lb

## 2024-11-13 DIAGNOSIS — M25551 Pain in right hip: Secondary | ICD-10-CM

## 2024-11-13 DIAGNOSIS — M545 Low back pain, unspecified: Secondary | ICD-10-CM

## 2024-11-13 DIAGNOSIS — M25552 Pain in left hip: Secondary | ICD-10-CM

## 2024-11-13 NOTE — Progress Notes (Signed)
 Orthopedic Spine Surgery Office Note  Assessment: Patient is a 54 y.o. female with bilateral lateral hip pain noted especially when she applies pressure to that area   Plan: -Patient's history seems consistent with trochanteric bursitis.  She did not respond to injections which points against it.  Her symptoms are not consistent though with an L3 distribution radiculopathy.  She also did not get any relief with a diagnostic injection.  At this point, I recommend an MRI of the pelvis to look at both hips since patient has tried over 6 weeks of conservative treat without any relief of her pain -Patient has tried PT, Tylenol , oral steroids, steroid injections -Patient should return to office in 4-5 weeks, x-rays at next visit: None   Patient expressed understanding of the plan and all questions were answered to the patient's satisfaction.   ___________________________________________________________________________   History:  Patient is a 54 y.o. female who presents today for lumbar spine.  Patient has had about a year of pain over the lateral aspects of her hips.  It for started on the right side and now involves the left side.  It has gotten progressively worse with time.  She notes the pain when she applies pressure to the side.  She notes it is particularly worse when sleeping on her side at night.  It gets better if she changes positions.  She does not have any pain rating down the legs.  She has mild low back pain but no significant back pain.  She has had diagnostic injections in her back and trochanteric bursa.  Neither provide her with any relief.   Weakness: Denies Symptoms of imbalance: Denies Paresthesias and numbness: Denies Bowel or bladder incontinence: Denies Saddle anesthesia: Denies  Treatments tried: PT, Tylenol , oral steroids, steroid injections  Review of systems: Denies fevers and chills, night sweats, unexplained weight loss, history of cancer.  Has had pain that  wakes her at night  Past medical history: History of stroke GERD COPD HTN HLD Anxiety DM (last A1c was 5.8 on 11/05/2024)  Allergies: latex  Past surgical history:  C4-6 ACDF Cholecystectomy C section Tubal ligation Knee arthroscopy Right TKA  Social history: Denies use of nicotine  product (smoking, vaping, patches, smokeless) Alcohol  use: rare Denies recreational drug use   Physical Exam:  BMI of 31.0  General: no acute distress, appears stated age Neurologic: alert, answering questions appropriately, following commands Respiratory: unlabored breathing on room air, symmetric chest rise Psychiatric: appropriate affect, normal cadence to speech   MSK (spine):  -Strength exam      Left  Right EHL    5/5  5/5 TA    5/5  5/5 GSC    5/5  5/5 Knee extension  5/5  5/5 Hip flexion   5/5  5/5  -Sensory exam    Sensation intact to light touch in L3-S1 nerve distributions of bilateral lower extremities  -Achilles DTR: 2/4 on the left, 2/4 on the right -Patellar tendon DTR: 2/4 on the left, 2/4 on the right  -Straight leg raise: Negative bilaterally -Clonus: no beats bilaterally  -Left hip exam: No pain through range of motion, no tenderness to palpation over the lateral hip, negative Stinchfield -Right hip exam: No pain through range of motion, no tenderness to palpation over the lateral hip, negative Stinchfield  Imaging: XRs of the lumbar spine from 11/13/2024 were independently reviewed and interpreted, showing grade 1 spondylolisthesis with disc height loss at L3/4.  There is small anterior osteophyte formation at L3/4.  No fracture or dislocation seen.  MRI of the lumbar spine from 12/20/2022 was independently reviewed and interpreted, showing left sided foraminal stenosis at L3/4. No other significant stenosis seen.    Patient name: [name] Patient MRN: [id number] Date of visit: 11/13/2024

## 2024-11-19 ENCOUNTER — Encounter: Payer: PRIVATE HEALTH INSURANCE | Admitting: Physical Therapy

## 2024-11-19 ENCOUNTER — Telehealth: Payer: Self-pay | Admitting: Physical Therapy

## 2024-11-19 NOTE — Telephone Encounter (Signed)
 Pt did not show for her PT appt today, called and left VM for pt.  Discussed next appt time and to call if she needs to cancel.  Did note, she has MRI ordered so may want to hold PT until after her MRI results.   Corean JULIANNA Ku, PT, DPT 11/19/2024 10:35 AM

## 2024-11-19 NOTE — Therapy (Incomplete)
 " OUTPATIENT PHYSICAL THERAPY TREATMENT   Patient Name: [name] MRN: [id number] DOB:1971-06-29, 54 y.o., female Today's Date: 11/19/2024  END OF SESSION:      Past Medical History:  Diagnosis Date   Anemia    Anxiety    Arthritis    right knee, back   COPD (chronic obstructive pulmonary disease) (HCC)    GERD (gastroesophageal reflux disease)    Headache(784.0)    History of blood transfusion    Hx; of in 1991 after delivery   History of TIAs    Hypertension    Numbness    Right hand   Pneumonia    Stroke (HCC) 05/2013   Past Surgical History:  Procedure Laterality Date   ANTERIOR CERVICAL DECOMP/DISCECTOMY FUSION N/A 03/06/2014   Procedure: ANTERIOR CERVICAL DECOMPRESSION/DISCECTOMY FUSION 2 LEVELS four/five, five/six;  Surgeon: Alm GORMAN Molt, MD;  Location: [facility] NEURO ORS;  Service: Neurosurgery;  Laterality: N/A;   CESAREAN SECTION     x 1 with 5th pregnancy   CHOLECYSTECTOMY     KNEE ARTHROSCOPY Right 2015   TEE WITHOUT CARDIOVERSION N/A 05/21/2013   Procedure: TRANSESOPHAGEAL ECHOCARDIOGRAM (TEE);  Surgeon: Wilbert JONELLE Bihari, MD;  Location: [facility] ENDOSCOPY;  Service: Cardiovascular;  Laterality: N/A;   TOTAL KNEE ARTHROPLASTY Right 01/06/2021   TOTAL KNEE ARTHROPLASTY Right 01/06/2021   Procedure: RIGHT TOTAL KNEE ARTHROPLASTY;  Surgeon: Addie Cordella Hamilton, MD;  Location: [facility] OR;  Service: Orthopedics;  Laterality: Right;   TUBAL LIGATION     Patient Active Problem List   Diagnosis Date Noted   Adjustment disorder with mixed anxiety and depressed mood 04/19/2023   Hyperlipidemia 11/16/2022   Diabetes mellitus, type 2 (HCC) 11/16/2022   Trochanteric bursitis, left hip 09/03/2022   Asthmatic bronchitis with acute exacerbation 04/09/2022   Chronic cough 01/27/2022   Right thyroid nodule 01/07/2022   Essential hypertension 01/05/2022   Migraine 01/05/2022   Thyroid nodule greater than or equal to 1.5 cm in diameter incidentally noted on imaging study 12/01/2021    Grief 02/06/2021   Non-intractable vomiting 02/06/2021   Arthritis of right knee    S/P knee replacement 01/06/2021   Leiomyoma of body of uterus 12/24/2016   Prediabetes 11/11/2016   Pap smear abnormality of cervix with ASCUS favoring benign 01/01/2016   COPD (chronic obstructive pulmonary disease) (HCC) 11/18/2015   GERD (gastroesophageal reflux disease) 11/18/2015   Abnormal imaging of thyroid 11/04/2015   COPD exacerbation (HCC) 11/01/2015   Elevated d-dimer 11/01/2015   IDA (iron  deficiency anemia) 09/04/2015   Patellofemoral disorder 04/04/2015   Headache disorder 07/30/2014   Insomnia 04/09/2014   Cervical spondylosis with radiculopathy 03/25/2014   S/P cervical spinal fusion 03/06/2014   Class 2 severe obesity due to excess calories with serious comorbidity and body mass index (BMI) of 37.0 to 37.9 in adult 02/22/2014   Brachial radiculitis 01/29/2014   Radicular pain in right arm 01/11/2014   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   Other and unspecified hyperlipidemia 08/24/2013   History of CVA (cerebrovascular accident) 05/18/2013   Tobacco abuse, 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    PCP: Jaycee Greig PARAS, NP  REFERRING PROVIDER: Addie Cordella Hamilton, MD  REFERRING DIAG: M54.16 (ICD-10-CM) - Lumbar radiculopathy  Rationale for Evaluation and Treatment: Rehabilitation  THERAPY DIAG:  No diagnosis found.  ONSET DATE: Rt x 1 year; Lt x 4 months   SUBJECTIVE:  SUBJECTIVE STATEMENT: ***Feels the same; no pain at this time.  No improvement in sleeping.    PERTINENT HISTORY:  C4-6 ACDF, Rt TKA, hx CVA (2014), HTN, COPD, anxiety, arthritis  PAIN:  ***Are you having pain? Yes: NPRS scale: 0 currently, up to 5/10 Pain location: bil lateral hips, radiates to feet Pain description:  aching Aggravating factors: lying on either side, sitting (has to reposition) Relieving factors: repositioning  PRECAUTIONS:  None  RED FLAGS: None   WEIGHT BEARING RESTRICTIONS:  No  FALLS:  Has patient fallen in last 6 months? 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  DIAGNOSTIC FINDINGS:  MRI (Feb 2024): 1. Progressive now severe bilateral facet arthropathy at L3-L4 with new mild spinal canal and moderate left neuroforaminal stenosis.1. Progressive now severe bilateral facet arthropathy at L3-L4 with new mild spinal canal and moderate left neuroforaminal stenosis.  X-rays (Nov 2025): No  significant degenerative disc disease between the vertebral bodies.   Moderate facet arthritis is present throughout the lumbar spine.  No acute  compression fractures.  PATIENT SURVEYS:  Patient-Specific Activity Scoring Scheme  0 represents unable to perform. 10 represents able to perform at prior level. 0 1 2 3 4 5 6 7 8 9  10 (Date and Score)   Activity Eval     1. Lying on either side  0    2. Sitting 8    Score 4    Total score = sum of the activity scores/number of activities Minimum detectable change (90%CI) for average score = 2 points Minimum detectable change (90%CI) for single activity score = 3 points    COGNITIVE STATUS: Within functional limits for tasks assessed   SENSATION: WFL  POSTURE:  rounded shoulders, forward head, and increased lumbar lordosis  GAIT: 10/18/2024 Comments: independent   PALPATION: 10/18/2024 Pain L1-5; bil greater trochanter   LUMBAR ROM:   ROM  A/PROM  eval  Flexion Limited 25%  Extension WNL  Right quadrant WNL with pain  Left quadrant WNL with pain   (Blank rows = not tested)     LOWER EXTREMITY MMT:    MMT Right eval Left eval  Hip flexion 5/5 5/5  Hip extension 4/5 4/5  Hip abduction 4/5 4/5  Knee flexion 5/5 5/5  Knee extension 5/5 5/5  Ankle dorsiflexion 5/5 5/5   (Blank rows = not tested)    SPECIAL TESTS:  10/18/2024 Lumbar Slump test: Negative   TREATMENT:  DATE:  11/19/24 ***     10/31/24 TherEx NuStep L5 x 8 min Standing hip abduction with extension 2x10; L3 band Squats 2x10 Supine single knee to chest 3x30 sec Supine figure-4 piriformis stretch 3x30 sec  Modalities Ionto to bil hips/greater trochanters; 2.0c c fill of dexamethasone  to 4 hour patch Educated on patch wear and expectations/precautions   10/23/24 TherEx NuStep L5 x 8 min Bridges x 10 reps; 5 sec hold Single knee to chest 3x30 sec bil Supine piriformis stretch 3x30 sec bil Lower trunk rotation 3x30 sec bil Standing hip abduction x10 reps bil; L3 band Standing hip extension x10 reps bil; L3 band  Manual IASTM with percussive device to bil glutes, lumbar paraspinals and QLs    10/18/2024 TherEx See HEP - demonstrated with trial reps performed PRN, mod cues for comprehension    Self Care Educated on clinical findings and PT POC     PATIENT EDUCATION:  Education details: HEP Person educated: Patient Education method: Programmer, Multimedia, Facilities Manager, and Handouts Education comprehension: verbalized understanding, returned demonstration, and needs further education  HOME EXERCISE PROGRAM: Access Code: [email or web address] URL: [email or web address] Date: 10/18/2024 Prepared by: Corean Ku  Exercises - Supine Bridge  - 2 x daily - 7 x weekly - 2 sets - 10 reps - 5 sec hold -  Standing Hip Extension with Resistance at Ankles and Counter Support  - 2 x daily - 7 x weekly - 2 sets - 10 reps - Standing Hip Abduction with Resistance at Ankles and Counter Support  - 2 x daily - 7 x weekly - 2 sets - 10 reps - Hooklying Single Knee to Chest  - 2 x daily - 7 x weekly - 1 sets - 3 reps - 30 sec hold - Supine Piriformis Stretch with Foot on Ground  - 2 x daily - 7 x weekly - 1 sets - 3 reps - 30 sec hold - Supine Lower Trunk Rotation  - 2 x daily - 7 x weekly - 1 sets - 3 reps - 30 sec hold   ASSESSMENT:  CLINICAL IMPRESSION: ***Trial of ionto today to bil hips to see if this helps with pain and sleeping.  Overall minimal change in pain reported at this time.  Continue skilled PT.      OBJECTIVE IMPAIRMENTS: decreased mobility, difficulty walking, decreased ROM, decreased strength, postural dysfunction, and pain.   ACTIVITY LIMITATIONS: carrying, lifting, sitting, sleeping, bed mobility, and locomotion level  PARTICIPATION LIMITATIONS: meal prep, cleaning, driving, shopping, community activity, and occupation  PERSONAL FACTORS: Age, Behavior pattern, Past/current experiences, Time since onset of injury/illness/exacerbation, and 3+ comorbidities: C4-6 ACDF, Rt TKA, hx CVA (2014), HTN, COPD, anxiety, arthritis are also affecting patient's functional outcome.   REHAB POTENTIAL: Good  CLINICAL DECISION MAKING: Evolving/moderate complexity  EVALUATION COMPLEXITY: Moderate   GOALS: Goals reviewed with patient? Yes  SHORT TERM GOALS: Target date: 11/08/2024***  Independent with initial HEP Goal status: INITIAL   LONG TERM GOALS: Target date: 11/29/2024  Independent with final HEP Goal status: INITIAL  2.  PSFS score improved by 2 points Goal status: INITIAL  3.  Bil hip strength improved to 5/5 for improved mobility and function Goal status: INIITAL  4.  Report pain < 3/10 with lying on either side for improved function Goal status: INITIAL   PLAN:  PT  FREQUENCY: 1x/week  PT DURATION: 6 weeks  PLANNED INTERVENTIONS: [id number]- PT Re-evaluation, 97750- Physical Performance Testing, 97110-Therapeutic exercises, 97530- Therapeutic activity, [id number]- Neuromuscular re-education, 97535- Self Care, [id number]-  Manual therapy, [id number]- Aquatic Therapy, [phone]- Electrical stimulation (unattended), [id number]- Ultrasound, [id number] (1-2 muscles), [id number] (3+ muscles)- Dry Needling, Patient/Family education, Taping, Joint mobilization, Joint manipulation, Cryotherapy, and Moist heat.  PLAN FOR NEXT SESSION: ***assess response to ionto,  Review HEP, manual/modalities PRN, needs glute strengthening   NEXT MD VISIT: Dr. Georgina 11/13/24   Corean JULIANNA Ku, PT, DPT 11/19/2024 7:25 AM   "

## 2024-11-23 ENCOUNTER — Encounter: Payer: Self-pay | Admitting: Physical Therapy

## 2024-11-26 ENCOUNTER — Encounter: Payer: PRIVATE HEALTH INSURANCE | Admitting: Physical Therapy

## 2024-11-28 ENCOUNTER — Other Ambulatory Visit: Payer: PRIVATE HEALTH INSURANCE

## 2024-12-13 ENCOUNTER — Ambulatory Visit
Admission: RE | Admit: 2024-12-13 | Discharge: 2024-12-13 | Disposition: A | Payer: PRIVATE HEALTH INSURANCE | Source: Ambulatory Visit | Attending: Orthopedic Surgery

## 2024-12-13 DIAGNOSIS — M25551 Pain in right hip: Secondary | ICD-10-CM

## 2025-02-04 ENCOUNTER — Ambulatory Visit: Payer: Self-pay | Admitting: Family
